# Patient Record
Sex: Female | Born: 1960
Health system: Southern US, Community
[De-identification: ages and names within clinical notes are randomized; demographics above are authoritative.]

## PROBLEM LIST (undated history)

## (undated) DIAGNOSIS — R51 Headache: Secondary | ICD-10-CM

## (undated) DIAGNOSIS — K08109 Complete loss of teeth, unspecified cause, unspecified class: Secondary | ICD-10-CM

## (undated) DIAGNOSIS — IMO0002 Reserved for concepts with insufficient information to code with codable children: Secondary | ICD-10-CM

## (undated) DIAGNOSIS — R42 Dizziness and giddiness: Secondary | ICD-10-CM

## (undated) DIAGNOSIS — H409 Unspecified glaucoma: Secondary | ICD-10-CM

## (undated) DIAGNOSIS — G8929 Other chronic pain: Secondary | ICD-10-CM

## (undated) DIAGNOSIS — K862 Cyst of pancreas: Secondary | ICD-10-CM

## (undated) DIAGNOSIS — J189 Pneumonia, unspecified organism: Secondary | ICD-10-CM

## (undated) DIAGNOSIS — G894 Chronic pain syndrome: Secondary | ICD-10-CM

## (undated) DIAGNOSIS — M1990 Unspecified osteoarthritis, unspecified site: Secondary | ICD-10-CM

## (undated) DIAGNOSIS — K219 Gastro-esophageal reflux disease without esophagitis: Secondary | ICD-10-CM

## (undated) DIAGNOSIS — Z91199 Patient's noncompliance with other medical treatment and regimen due to unspecified reason: Secondary | ICD-10-CM

## (undated) DIAGNOSIS — Z9119 Patient's noncompliance with other medical treatment and regimen: Secondary | ICD-10-CM

## (undated) DIAGNOSIS — M5126 Other intervertebral disc displacement, lumbar region: Secondary | ICD-10-CM

## (undated) DIAGNOSIS — M545 Low back pain, unspecified: Secondary | ICD-10-CM

## (undated) DIAGNOSIS — M5010 Cervical disc disorder with radiculopathy, unspecified cervical region: Secondary | ICD-10-CM

## (undated) DIAGNOSIS — S83209A Unspecified tear of unspecified meniscus, current injury, unspecified knee, initial encounter: Secondary | ICD-10-CM

## (undated) DIAGNOSIS — I10 Essential (primary) hypertension: Secondary | ICD-10-CM

## (undated) DIAGNOSIS — E119 Type 2 diabetes mellitus without complications: Secondary | ICD-10-CM

## (undated) DIAGNOSIS — B029 Zoster without complications: Secondary | ICD-10-CM

## (undated) DIAGNOSIS — F329 Major depressive disorder, single episode, unspecified: Secondary | ICD-10-CM

## (undated) DIAGNOSIS — F32A Depression, unspecified: Secondary | ICD-10-CM

## (undated) DIAGNOSIS — E1142 Type 2 diabetes mellitus with diabetic polyneuropathy: Secondary | ICD-10-CM

## (undated) DIAGNOSIS — R079 Chest pain, unspecified: Secondary | ICD-10-CM

## (undated) HISTORY — DX: Major depressive disorder, single episode, unspecified: F32.9

## (undated) HISTORY — DX: Headache: R51

## (undated) HISTORY — DX: Cyst of pancreas: K86.2

## (undated) HISTORY — DX: Other chronic pain: G89.29

## (undated) HISTORY — DX: Type 2 diabetes mellitus without complications: E11.9

## (undated) HISTORY — DX: Unspecified glaucoma: H40.9

## (undated) HISTORY — DX: Essential (primary) hypertension: I10

## (undated) HISTORY — DX: Depression, unspecified: F32.A

## (undated) HISTORY — DX: Gastro-esophageal reflux disease without esophagitis: K21.9

## (undated) HISTORY — DX: Chest pain, unspecified: R07.9

## (undated) HISTORY — PX: ABDOMINAL HYSTERECTOMY: SHX81

## (undated) HISTORY — DX: Low back pain, unspecified: M54.50

## (undated) HISTORY — DX: Low back pain: M54.5

---

## 1997-10-18 ENCOUNTER — Other Ambulatory Visit: Admission: RE | Admit: 1997-10-18 | Discharge: 1997-10-18 | Payer: Self-pay | Admitting: *Deleted

## 1997-12-04 ENCOUNTER — Emergency Department (HOSPITAL_COMMUNITY): Admission: EM | Admit: 1997-12-04 | Discharge: 1997-12-04 | Payer: Self-pay | Admitting: Emergency Medicine

## 1998-01-10 ENCOUNTER — Emergency Department (HOSPITAL_COMMUNITY): Admission: EM | Admit: 1998-01-10 | Discharge: 1998-01-10 | Payer: Self-pay | Admitting: Emergency Medicine

## 1998-09-02 ENCOUNTER — Emergency Department (HOSPITAL_COMMUNITY): Admission: EM | Admit: 1998-09-02 | Discharge: 1998-09-02 | Payer: Self-pay | Admitting: Emergency Medicine

## 1999-02-03 ENCOUNTER — Other Ambulatory Visit: Admission: RE | Admit: 1999-02-03 | Discharge: 1999-02-03 | Payer: Self-pay | Admitting: *Deleted

## 2000-02-03 ENCOUNTER — Other Ambulatory Visit: Admission: RE | Admit: 2000-02-03 | Discharge: 2000-02-03 | Payer: Self-pay | Admitting: *Deleted

## 2000-08-19 ENCOUNTER — Emergency Department (HOSPITAL_COMMUNITY): Admission: EM | Admit: 2000-08-19 | Discharge: 2000-08-19 | Payer: Self-pay

## 2000-11-30 ENCOUNTER — Encounter: Payer: Self-pay | Admitting: Emergency Medicine

## 2000-11-30 ENCOUNTER — Emergency Department (HOSPITAL_COMMUNITY): Admission: EM | Admit: 2000-11-30 | Discharge: 2000-11-30 | Payer: Self-pay | Admitting: Emergency Medicine

## 2001-09-03 ENCOUNTER — Emergency Department (HOSPITAL_COMMUNITY): Admission: EM | Admit: 2001-09-03 | Discharge: 2001-09-03 | Payer: Self-pay | Admitting: *Deleted

## 2001-11-02 ENCOUNTER — Emergency Department (HOSPITAL_COMMUNITY): Admission: EM | Admit: 2001-11-02 | Discharge: 2001-11-03 | Payer: Self-pay | Admitting: Emergency Medicine

## 2001-11-26 ENCOUNTER — Emergency Department (HOSPITAL_COMMUNITY): Admission: EM | Admit: 2001-11-26 | Discharge: 2001-11-26 | Payer: Self-pay | Admitting: Emergency Medicine

## 2001-11-26 ENCOUNTER — Encounter: Payer: Self-pay | Admitting: Emergency Medicine

## 2002-05-16 ENCOUNTER — Emergency Department (HOSPITAL_COMMUNITY): Admission: EM | Admit: 2002-05-16 | Discharge: 2002-05-16 | Payer: Self-pay | Admitting: Emergency Medicine

## 2002-07-29 ENCOUNTER — Emergency Department (HOSPITAL_COMMUNITY): Admission: EM | Admit: 2002-07-29 | Discharge: 2002-07-30 | Payer: Self-pay

## 2002-08-05 ENCOUNTER — Encounter: Payer: Self-pay | Admitting: Orthopedic Surgery

## 2002-08-05 ENCOUNTER — Ambulatory Visit (HOSPITAL_COMMUNITY): Admission: RE | Admit: 2002-08-05 | Discharge: 2002-08-05 | Payer: Self-pay | Admitting: Orthopedic Surgery

## 2003-04-27 ENCOUNTER — Emergency Department (HOSPITAL_COMMUNITY): Admission: EM | Admit: 2003-04-27 | Discharge: 2003-04-27 | Payer: Self-pay | Admitting: Internal Medicine

## 2003-05-15 ENCOUNTER — Other Ambulatory Visit: Admission: RE | Admit: 2003-05-15 | Discharge: 2003-05-15 | Payer: Self-pay | Admitting: *Deleted

## 2003-11-01 ENCOUNTER — Encounter: Admission: RE | Admit: 2003-11-01 | Discharge: 2003-11-01 | Payer: Self-pay | Admitting: *Deleted

## 2003-11-07 ENCOUNTER — Encounter: Admission: RE | Admit: 2003-11-07 | Discharge: 2003-11-07 | Payer: Self-pay | Admitting: *Deleted

## 2003-12-11 ENCOUNTER — Emergency Department (HOSPITAL_COMMUNITY): Admission: EM | Admit: 2003-12-11 | Discharge: 2003-12-11 | Payer: Self-pay | Admitting: Emergency Medicine

## 2004-12-22 ENCOUNTER — Emergency Department (HOSPITAL_COMMUNITY): Admission: EM | Admit: 2004-12-22 | Discharge: 2004-12-22 | Payer: Self-pay | Admitting: Emergency Medicine

## 2004-12-24 ENCOUNTER — Emergency Department (HOSPITAL_COMMUNITY): Admission: EM | Admit: 2004-12-24 | Discharge: 2004-12-24 | Payer: Self-pay | Admitting: Emergency Medicine

## 2005-02-16 ENCOUNTER — Emergency Department (HOSPITAL_COMMUNITY): Admission: EM | Admit: 2005-02-16 | Discharge: 2005-02-16 | Payer: Self-pay | Admitting: Family Medicine

## 2005-03-30 ENCOUNTER — Emergency Department (HOSPITAL_COMMUNITY): Admission: EM | Admit: 2005-03-30 | Discharge: 2005-03-30 | Payer: Self-pay | Admitting: Family Medicine

## 2005-04-01 ENCOUNTER — Emergency Department (HOSPITAL_COMMUNITY): Admission: EM | Admit: 2005-04-01 | Discharge: 2005-04-01 | Payer: Self-pay | Admitting: Emergency Medicine

## 2005-08-04 ENCOUNTER — Observation Stay (HOSPITAL_COMMUNITY): Admission: RE | Admit: 2005-08-04 | Discharge: 2005-08-05 | Payer: Self-pay | Admitting: *Deleted

## 2005-08-04 ENCOUNTER — Encounter (INDEPENDENT_AMBULATORY_CARE_PROVIDER_SITE_OTHER): Payer: Self-pay | Admitting: Specialist

## 2005-08-24 ENCOUNTER — Emergency Department (HOSPITAL_COMMUNITY): Admission: EM | Admit: 2005-08-24 | Discharge: 2005-08-24 | Payer: Self-pay | Admitting: Emergency Medicine

## 2006-08-30 ENCOUNTER — Emergency Department (HOSPITAL_COMMUNITY): Admission: EM | Admit: 2006-08-30 | Discharge: 2006-08-30 | Payer: Self-pay | Admitting: Family Medicine

## 2006-12-28 ENCOUNTER — Emergency Department (HOSPITAL_COMMUNITY): Admission: EM | Admit: 2006-12-28 | Discharge: 2006-12-28 | Payer: Self-pay | Admitting: Emergency Medicine

## 2007-05-28 ENCOUNTER — Emergency Department (HOSPITAL_COMMUNITY): Admission: EM | Admit: 2007-05-28 | Discharge: 2007-05-28 | Payer: Self-pay | Admitting: Emergency Medicine

## 2007-07-01 ENCOUNTER — Emergency Department (HOSPITAL_COMMUNITY): Admission: EM | Admit: 2007-07-01 | Discharge: 2007-07-01 | Payer: Self-pay | Admitting: Emergency Medicine

## 2007-08-24 ENCOUNTER — Emergency Department (HOSPITAL_COMMUNITY): Admission: EM | Admit: 2007-08-24 | Discharge: 2007-08-24 | Payer: Self-pay | Admitting: Emergency Medicine

## 2007-10-24 ENCOUNTER — Emergency Department (HOSPITAL_COMMUNITY): Admission: EM | Admit: 2007-10-24 | Discharge: 2007-10-24 | Payer: Self-pay | Admitting: Emergency Medicine

## 2008-01-26 ENCOUNTER — Emergency Department (HOSPITAL_COMMUNITY): Admission: EM | Admit: 2008-01-26 | Discharge: 2008-01-26 | Payer: Self-pay | Admitting: Family Medicine

## 2008-03-29 ENCOUNTER — Emergency Department (HOSPITAL_COMMUNITY): Admission: EM | Admit: 2008-03-29 | Discharge: 2008-03-29 | Payer: Self-pay | Admitting: Family Medicine

## 2008-04-26 ENCOUNTER — Emergency Department (HOSPITAL_COMMUNITY): Admission: EM | Admit: 2008-04-26 | Discharge: 2008-04-27 | Payer: Self-pay | Admitting: *Deleted

## 2008-08-03 ENCOUNTER — Emergency Department (HOSPITAL_COMMUNITY): Admission: EM | Admit: 2008-08-03 | Discharge: 2008-08-03 | Payer: Self-pay | Admitting: Family Medicine

## 2008-08-08 ENCOUNTER — Emergency Department (HOSPITAL_COMMUNITY): Admission: EM | Admit: 2008-08-08 | Discharge: 2008-08-08 | Payer: Self-pay | Admitting: Emergency Medicine

## 2009-02-07 ENCOUNTER — Emergency Department (HOSPITAL_COMMUNITY): Admission: EM | Admit: 2009-02-07 | Discharge: 2009-02-07 | Payer: Self-pay | Admitting: Emergency Medicine

## 2009-07-20 DIAGNOSIS — E119 Type 2 diabetes mellitus without complications: Secondary | ICD-10-CM

## 2009-07-20 HISTORY — DX: Type 2 diabetes mellitus without complications: E11.9

## 2009-08-18 ENCOUNTER — Emergency Department (HOSPITAL_COMMUNITY): Admission: EM | Admit: 2009-08-18 | Discharge: 2009-08-19 | Payer: Self-pay | Admitting: Emergency Medicine

## 2009-08-30 ENCOUNTER — Ambulatory Visit (HOSPITAL_COMMUNITY): Admission: RE | Admit: 2009-08-30 | Discharge: 2009-08-30 | Payer: Self-pay | Admitting: Orthopedic Surgery

## 2009-09-03 ENCOUNTER — Ambulatory Visit (HOSPITAL_COMMUNITY): Admission: RE | Admit: 2009-09-03 | Discharge: 2009-09-03 | Payer: Self-pay | Admitting: Orthopedic Surgery

## 2009-10-02 ENCOUNTER — Ambulatory Visit (HOSPITAL_COMMUNITY): Admission: RE | Admit: 2009-10-02 | Discharge: 2009-10-02 | Payer: Self-pay | Admitting: Gastroenterology

## 2010-06-30 ENCOUNTER — Emergency Department (HOSPITAL_COMMUNITY)
Admission: EM | Admit: 2010-06-30 | Discharge: 2010-06-30 | Payer: Self-pay | Source: Home / Self Care | Admitting: Emergency Medicine

## 2010-09-29 LAB — URINALYSIS, ROUTINE W REFLEX MICROSCOPIC
Bilirubin Urine: NEGATIVE
Glucose, UA: >1000 mg/dL — AB
Hgb urine dipstick: NEGATIVE
Ketones, ur: 40 mg/dL — AB
Leukocytes, UA: NEGATIVE
Nitrite: NEGATIVE
Protein, ur: NEGATIVE mg/dL
Specific Gravity, Urine: 1.041 — ABNORMAL HIGH (ref 1.005–1.030)
Urobilinogen, UA: 0.2 mg/dL (ref 0.0–1.0)
pH: 6 (ref 5.0–8.0)

## 2010-09-29 LAB — BASIC METABOLIC PANEL
BUN: 13 mg/dL (ref 6–23)
CO2: 21 mEq/L (ref 19–32)
Calcium: 9.4 mg/dL (ref 8.4–10.5)
Chloride: 101 mEq/L (ref 96–112)
Creatinine, Ser: 0.76 mg/dL (ref 0.4–1.2)
GFR calc Af Amer: >60 mL/min (ref >60)
GFR calc non Af Amer: >60 mL/min (ref >60)
Glucose, Bld: 465 mg/dL — ABNORMAL HIGH (ref 70–99)
Potassium: 4.5 mEq/L (ref 3.5–5.1)
Sodium: 132 mEq/L — ABNORMAL LOW (ref 135–145)

## 2010-09-29 LAB — GLUCOSE, CAPILLARY
Glucose-Capillary: 260 mg/dL — ABNORMAL HIGH (ref 70–99)
Glucose-Capillary: 497 mg/dL — ABNORMAL HIGH (ref 70–99)

## 2010-09-29 LAB — URINE MICROSCOPIC-ADD ON

## 2010-10-13 LAB — PANC CYST FLD ANLYS-PATHFNDR-TG

## 2010-12-05 NOTE — Op Note (Signed)
[name] [name], EBEY NO.:  [id number]   MEDICAL RECORD NO.:  [id number]          PATIENT TYPE:  OBV   LOCATION:  9315                          FACILITY:  [facility]   PHYSICIAN:  Gerri Spore B. Earlene Plater, M.D.  DATE OF BIRTH:  1960-11-01   DATE OF PROCEDURE:  08/04/2005  DATE OF DISCHARGE:                                 OPERATIVE REPORT   PREOPERATIVE DIAGNOSIS:  Heavy menstrual bleeding.   POSTOPERATIVE DIAGNOSIS:  Heavy menstrual bleeding.   OPERATION/PROCEDURE:  Laparoscopically supracervical hysterectomy.   SURGEON:  Chester Holstein. Earlene Plater, M.D.   ASSISTANT:  Lenoard Aden, M.D.   ANESTHESIA:  General.   SPECIMENS:  Uterus.   ESTIMATED BLOOD LOSS:  __________.   COMPLICATIONS:  None.   INDICATIONS:  The patient with a history of heavy and irregular menstrual  periods.  Ultrasound suggestive of a slightly enlarged uterus.  No discrete  fibroids were seen.  Endometrial biopsy was benign and no history of  abnormal Pap smears.  Options of management including things such as Jearld Adjutant  IUD, endometrial ablation or hysterectomy were discussed.  The patient  preferred to move on to definitive, minimally invasive surgical therapy.  She was made aware of the 1% chance of regular menses and 7% occasional  staining and spotting.  Additional risks of surgery discussed including  infection, bleeding, damage to surrounding organs.  The patient also made  aware of the need for continued cervical cytology.   DESCRIPTION OF PROCEDURE:  The patient was taken to the operating room and  general anesthesia obtained.  She was prepped and draped in the standard  fashion and Foley catheter inserted into the bladder.   A 10 mm incision placed in the umbilicus, carried sharply to the fascia.  The fascia was divided sharply and elevated with Kocher clamps.  Posterior  sheath and peritoneum were entered sharply.  Purse-string suture of 0 Vicryl  placed around the fascial defect.  Hasson  cannula inserted and secured.  Pneumoperitoneum obtained with CO2 gas.  Trendelenburg position was  obtained.  An 11 mm Excel trocar placed in the left lower quadrant, 5 mm in  the right.  Each under direct laparoscopic visualization.   Upper abdomen was inspected and it was normal.  The pelvis was inspected and  it showed a upper limits of normal size uterus which was retroverted.  It  had a very boggy appearance consistent with adenomyosis.  Tubes and ovaries  appeared normal.  Minimal scarring from the bladder to the uterus from her  previous two cesarean sections.   The course of each ureter was identified.  The left round ligament was  placed on traction, sealed and divided with the  Harmonic.  The left tube  and left utero-ovarian ligament were similarly sealed and divided with the  Harmonic. The bladder flap was developed with the Harmonic.  The cardinal  ligament was then skeletonized, exposing the uterine artery and vein.  These  were clamped with the Harmonic ACE, sealed and divided.  The entire  procedure was repeated on the right side in the  exact same manner.   The cervix was transected in the reverse cone fashion with the Harmonic.  After amputation, the cervical canal was cauterized 20 seconds with Harmonic  on max setting.   The morcellator device was then inserted and the uterus morcellated in the  standard fashion. All debris was removed.   The pneumoperitoneum was taken down to 5-6 mm, and there was one bleeder in  the right angle of the cervical stump which was made hemostatic with bipolar  cautery.  The pelvis was hemostatic.  Pelvis was irrigated. No other debris  identified and, therefore, the procedure was terminated.   The inferior ports were removed and their sites hemostatic.  As the 11 mm  port was an Excel trocar, fascial closure was deemed not necessary.  The  scope was removed and Hasson cannula removed.  Gas was released. Abdominal  wall elevated with  [employer] retractors and the purse-string suture was  snugged down.  This obliterated the fascial defect and no intra-abdominal  contents herniated through prior to closure.  Skin was closed in the  umbilicus with 4-0 Vicryl.  Skin was closed at the inferior ports with  Dermabond.   The patient tolerated the procedure well.  No complications.  She was taken  to the recovery room awake, alert and in stable condition.  All counts  correct per the operating room staff.      Gerri Spore B. Earlene Plater, M.D.  Electronically Signed     WBD/MEDQ  D:  08/04/2005  T:  08/05/2005  Job:  [id number]

## 2011-04-10 LAB — POCT URINALYSIS DIP (DEVICE)
Bilirubin Urine: NEGATIVE
Glucose, UA: NEGATIVE
Ketones, ur: NEGATIVE
Nitrite: NEGATIVE
Operator id: 239701
Protein, ur: 30 — AB
Specific Gravity, Urine: 1.02
Urobilinogen, UA: 0.2
pH: 6.5

## 2011-04-22 LAB — URINE CULTURE: Colony Count: >=100000

## 2011-04-22 LAB — POCT URINALYSIS DIP (DEVICE)
Bilirubin Urine: NEGATIVE
Glucose, UA: NEGATIVE
Ketones, ur: NEGATIVE
Nitrite: NEGATIVE
Operator id: 239701
Protein, ur: 100 — AB
Specific Gravity, Urine: 1.025
Urobilinogen, UA: 0.2
pH: 5.5

## 2011-12-07 ENCOUNTER — Emergency Department (HOSPITAL_COMMUNITY): Payer: Self-pay

## 2011-12-07 ENCOUNTER — Encounter (HOSPITAL_COMMUNITY): Payer: Self-pay | Admitting: Emergency Medicine

## 2011-12-07 ENCOUNTER — Emergency Department (HOSPITAL_COMMUNITY)
Admission: EM | Admit: 2011-12-07 | Discharge: 2011-12-07 | Disposition: A | Discharge status: Home / Self Care | Payer: Self-pay | Attending: Emergency Medicine | Admitting: Emergency Medicine

## 2011-12-07 DIAGNOSIS — E119 Type 2 diabetes mellitus without complications: Secondary | ICD-10-CM | POA: Insufficient documentation

## 2011-12-07 DIAGNOSIS — Z79899 Other long term (current) drug therapy: Secondary | ICD-10-CM | POA: Insufficient documentation

## 2011-12-07 DIAGNOSIS — R209 Unspecified disturbances of skin sensation: Secondary | ICD-10-CM | POA: Insufficient documentation

## 2011-12-07 DIAGNOSIS — R079 Chest pain, unspecified: Secondary | ICD-10-CM | POA: Insufficient documentation

## 2011-12-07 DIAGNOSIS — R110 Nausea: Secondary | ICD-10-CM | POA: Insufficient documentation

## 2011-12-07 DIAGNOSIS — R0602 Shortness of breath: Secondary | ICD-10-CM | POA: Insufficient documentation

## 2011-12-07 LAB — DIFFERENTIAL
Basophils Absolute: 0 10*3/uL (ref 0.0–0.1)
Basophils Relative: 0 % (ref 0–1)
Eosinophils Absolute: 0.2 10*3/uL (ref 0.0–0.7)
Eosinophils Relative: 3 % (ref 0–5)
Lymphocytes Relative: 37 % (ref 12–46)
Lymphs Abs: 2.1 10*3/uL (ref 0.7–4.0)
Monocytes Absolute: 0.4 10*3/uL (ref 0.1–1.0)
Monocytes Relative: 7 % (ref 3–12)
Neutro Abs: 3 10*3/uL (ref 1.7–7.7)
Neutrophils Relative %: 53 % (ref 43–77)

## 2011-12-07 LAB — CBC
HCT: 40.3 % (ref 36.0–46.0)
Hemoglobin: 13.8 g/dL (ref 12.0–15.0)
MCH: 32.3 pg (ref 26.0–34.0)
MCHC: 34.2 g/dL (ref 30.0–36.0)
MCV: 94.4 fL (ref 78.0–100.0)
Platelets: 234 10*3/uL (ref 150–400)
RBC: 4.27 MIL/uL (ref 3.87–5.11)
RDW: 12 % (ref 11.5–15.5)
WBC: 5.7 10*3/uL (ref 4.0–10.5)

## 2011-12-07 LAB — BASIC METABOLIC PANEL
BUN: 9 mg/dL (ref 6–23)
CO2: 18 mEq/L — ABNORMAL LOW (ref 19–32)
Calcium: 9.4 mg/dL (ref 8.4–10.5)
Chloride: 105 mEq/L (ref 96–112)
Creatinine, Ser: 0.68 mg/dL (ref 0.50–1.10)
GFR calc Af Amer: >90 mL/min (ref >90)
GFR calc non Af Amer: >90 mL/min (ref >90)
Glucose, Bld: 192 mg/dL — ABNORMAL HIGH (ref 70–99)
Potassium: 3.9 mEq/L (ref 3.5–5.1)
Sodium: 136 mEq/L (ref 135–145)

## 2011-12-07 LAB — POCT I-STAT TROPONIN I: Troponin i, poc: 0 ng/mL (ref 0.00–0.08)

## 2011-12-07 LAB — GLUCOSE, CAPILLARY: Glucose-Capillary: 183 mg/dL — ABNORMAL HIGH (ref 70–99)

## 2011-12-07 MED ORDER — ONDANSETRON HCL 4 MG/2ML IJ SOLN
INTRAMUSCULAR | Status: AC
  Administered 2011-12-07: 4 mg
  Filled 2011-12-07: qty 2

## 2011-12-07 MED ORDER — NITROGLYCERIN 0.4 MG SL SUBL
0.4000 mg | SUBLINGUAL_TABLET | Freq: Once | SUBLINGUAL | Status: AC
  Administered 2011-12-07: 0.4 mg via SUBLINGUAL
  Filled 2011-12-07: qty 25

## 2011-12-07 MED ORDER — OXYCODONE-ACETAMINOPHEN 5-325 MG PO TABS: 1.0000 | ORAL_TABLET | Freq: Four times a day (QID) | ORAL | Status: AC | PRN

## 2011-12-07 MED ORDER — MORPHINE SULFATE 4 MG/ML IJ SOLN
4.0000 mg | Freq: Once | INTRAMUSCULAR | Status: AC
  Administered 2011-12-07: 4 mg via INTRAVENOUS
  Filled 2011-12-07: qty 1

## 2011-12-07 MED ORDER — SODIUM CHLORIDE 0.9 % IV BOLUS (SEPSIS)
500.0000 mL | Freq: Once | INTRAVENOUS | Status: AC
  Administered 2011-12-07: 500 mL via INTRAVENOUS

## 2011-12-07 NOTE — ED Provider Notes (Signed)
History   This chart was scribed for Donnetta Hutching, MD by Clarita Crane. The patient was seen in room APA06/APA06. Patient's care was started at 0711.    CSN: [id number]  Arrival date & time 12/07/11  0711   First MD Initiated Contact with Patient 12/07/11 [phone]      Chief Complaint  Patient presents with  . Abdominal Pain    (Consider location/radiation/quality/duration/timing/severity/associated sxs/prior treatment) HPI [name] [name] is a 51 y.o. female who presents to the Emergency Department complaining of intermittent moderate to severe chest pain localized to sternal region and described as tightness onset 2 days ago and gradually worsening since with associated SOB, diffuse numbness and nausea. Patient reports episodes of chest pain last 2 minutes before resolving on own and occur approximately 10 times per hour. Notes chest pain is not aggravated with palpation or exertion. Denies diaphoresis, vomiting, fever, chills, cough. Patient with a h/o type 2 diabetes, abdominal hysterectomy and c-section and is a current smoker. Patient reports having family h/o cardiac problems (brother with MI at age 35).   PCP- Elly Modena Garden  Past Medical History  Diagnosis Date  . Diabetes mellitus     Past Surgical History  Procedure Date  . Abdominal hysterectomy   . Cesarean section     History reviewed. No pertinent family history.  History  Substance Use Topics  . Smoking status: Current Everyday Smoker  . Smokeless tobacco: Not on file  . Alcohol Use: Yes     occasional    OB History    Grav Para Term Preterm Abortions TAB SAB Ect Mult Living                  Review of Systems A complete 10 system review of systems was obtained and all systems are negative except as noted in the HPI and PMH.   Allergies  Tramadol  Home Medications   Current Outpatient Rx  Name Route Sig Dispense Refill  . CITALOPRAM HYDROBROMIDE 10 MG PO TABS Oral Take 10 mg by mouth daily.      . IBUPROFEN 200 MG PO TABS Oral Take 800 mg by mouth every 6 (six) hours as needed. For pain    . LISINOPRIL 10 MG PO TABS Oral Take 10 mg by mouth every evening.     Marland Kitchen METFORMIN HCL 1000 MG PO TABS Oral Take 1,000 mg by mouth 2 (two) times daily with a meal.      BP 137/64  Pulse 78  Temp 97.8 F (36.6 C)  Resp 25  SpO2 100%  Physical Exam  Nursing note and vitals reviewed. Constitutional: She is oriented to person, place, and time. She appears well-developed and well-nourished.       Tearful  HENT:  Head: Normocephalic and atraumatic.  Eyes: EOM are normal. Pupils are equal, round, and reactive to light.  Neck: Neck supple. No tracheal deviation present.  Cardiovascular: Normal rate and regular rhythm.  Exam reveals no gallop and no friction rub.   No murmur heard. Pulmonary/Chest: Effort normal. No respiratory distress. She has no wheezes. She has no rales. She exhibits no tenderness.  Abdominal: Soft. She exhibits no distension.  Musculoskeletal: Normal range of motion. She exhibits no edema.  Neurological: She is alert and oriented to person, place, and time. No sensory deficit.  Skin: Skin is warm and dry.  Psychiatric: Her behavior is normal.       Tearful     ED Course  Procedures (including critical care  time)  DIAGNOSTIC STUDIES: Oxygen Saturation is 100% on room air, normal by my interpretation.    COORDINATION OF CARE: 7:53AM-Patient informed of current clinical impression, plan for treatment and evaluation and agrees with plan at this time. Will administer pain medication NTG, morphine and obtain labs.     Labs Reviewed  GLUCOSE, CAPILLARY - Abnormal; Notable for the following:    Glucose-Capillary 183 (*)    All other components within normal limits  BASIC METABOLIC PANEL - Abnormal; Notable for the following:    CO2 18 (*)    Glucose, Bld 192 (*)    All other components within normal limits  CBC  DIFFERENTIAL   Dg Chest 2 View  12/07/2011   *RADIOLOGY REPORT*  Clinical Data: Chest pain  CHEST - 2 VIEW  Comparison: 08/08/2008  Findings: Normal heart size.  Clear lungs.  No pneumothorax or pleural effusion.  IMPRESSION: No active cardiopulmonary disease.  Original Report Authenticated By: Donavan Burnet, M.D.   Date: 12/07/2011  Rate: 79  Rhythm: normal sinus rhythm  QRS Axis: normal  Intervals: normal  ST/T Wave abnormalities: normal  Conduction Disutrbances: none  Narrative Interpretation: unremarkable  Results for orders placed during the hospital encounter of 12/07/11  GLUCOSE, CAPILLARY      Component Value Range   Glucose-Capillary 183 (*) 70 - 99 (mg/dL)  CBC      Component Value Range   WBC 5.7  4.0 - 10.5 (K/uL)   RBC 4.27  3.87 - 5.11 (MIL/uL)   Hemoglobin 13.8  12.0 - 15.0 (g/dL)   HCT 16.1  09.6 - 04.5 (%)   MCV 94.4  78.0 - 100.0 (fL)   MCH 32.3  26.0 - 34.0 (pg)   MCHC 34.2  30.0 - 36.0 (g/dL)   RDW 40.9  81.1 - 91.4 (%)   Platelets 234  150 - 400 (K/uL)  DIFFERENTIAL      Component Value Range   Neutrophils Relative 53  43 - 77 (%)   Neutro Abs 3.0  1.7 - 7.7 (K/uL)   Lymphocytes Relative 37  12 - 46 (%)   Lymphs Abs 2.1  0.7 - 4.0 (K/uL)   Monocytes Relative 7  3 - 12 (%)   Monocytes Absolute 0.4  0.1 - 1.0 (K/uL)   Eosinophils Relative 3  0 - 5 (%)   Eosinophils Absolute 0.2  0.0 - 0.7 (K/uL)   Basophils Relative 0  0 - 1 (%)   Basophils Absolute 0.0  0.0 - 0.1 (K/uL)  BASIC METABOLIC PANEL      Component Value Range   Sodium 136  135 - 145 (mEq/L)   Potassium 3.9  3.5 - 5.1 (mEq/L)   Chloride 105  96 - 112 (mEq/L)   CO2 18 (*) 19 - 32 (mEq/L)   Glucose, Bld 192 (*) 70 - 99 (mg/dL)   BUN 9  6 - 23 (mg/dL)   Creatinine, Ser 7.82  0.50 - 1.10 (mg/dL)   Calcium 9.4  8.4 - 95.6 (mg/dL)   GFR calc non Af Amer >90  >90 (mL/min)   GFR calc Af Amer >90  >90 (mL/min)  POCT I-STAT TROPONIN I      Component Value Range   Troponin i, poc 0.00  0.00 - 0.08 (ng/mL)   Comment 3               No  diagnosis found.    MDM  Patient feeling much better.  Normal vital signs. His troponin  negative. EKG negative. Followup with cardiologist. Appointment made for patient. We'll return if worse     I personally performed the services described in this documentation, which was scribed in my presence. The recorded information has been reviewed and considered.    Donnetta Hutching, MD 12/07/11 1155

## 2011-12-07 NOTE — ED Notes (Signed)
Pt c/o intermittant pain to epigastric area and under L breast. Pain woke her up this am with sharp pains and sob. Pt anxious and hyperventilating. States pain worse with breathing. Dizzy at present-demonstrated normal breathing. Denies pain worse with eating or moving. Denies n/v/d/cough.

## 2011-12-07 NOTE — ED Notes (Signed)
ntg did not help. Pain is coming intermittant and last a few seconds. Pt then starts to hyperventilate. Advised of correct breathing and comfort measures given. Pt states woke up with 4 spots on body x 3 days ago. All look different and thinks something may have bitten her. 2 on head. One on r lower leg and one to r shoulder area. nad

## 2011-12-07 NOTE — Discharge Instructions (Signed)
Chest Pain, Nonspecific It is often hard to give a specific diagnosis for the cause of chest pain. There is always a chance that your pain could be related to something serious, like a heart attack or a blood clot in the lungs. You need to follow up with your caregiver for further evaluation. More lab tests or other studies such as X-rays, electrocardiography, stress testing, or cardiac imaging may be needed to find the cause of your pain. Most of the time, nonspecific chest pain improves within 2 to 3 days with rest and mild pain medicine. For the next few days, avoid physical exertion or activities that bring on pain. Do not smoke. Avoid drinking alcohol. Call your caregiver for routine follow-up as advised.  SEEK IMMEDIATE MEDICAL CARE IF:  You develop increased chest pain or pain that radiates to the arm, neck, jaw, back, or abdomen.   You develop shortness of breath, increased coughing, or you start coughing up blood.   You have severe back or abdominal pain, nausea, or vomiting.   You develop severe weakness, fainting, fever, or chills.  Document Released: 07/06/2005 Document Revised: 06/25/2011 Document Reviewed: 12/24/2006 [facility] Patient Information 2012 [place], [place].  Tests were normal. Prescription for pain medication. We have made an appointment with the cardiologist for June 7. Please call office to confirm appointment. Number given

## 2011-12-07 NOTE — ED Notes (Signed)
Appointment with Dr. Dietrich Pates on June 7 at 11:15

## 2011-12-25 ENCOUNTER — Encounter: Payer: Self-pay | Admitting: Cardiology

## 2011-12-25 DIAGNOSIS — E1165 Type 2 diabetes mellitus with hyperglycemia: Secondary | ICD-10-CM | POA: Insufficient documentation

## 2011-12-25 DIAGNOSIS — R079 Chest pain, unspecified: Secondary | ICD-10-CM | POA: Insufficient documentation

## 2011-12-25 DIAGNOSIS — I10 Essential (primary) hypertension: Secondary | ICD-10-CM | POA: Insufficient documentation

## 2012-01-03 NOTE — Progress Notes (Signed)
This encounter was created in error - please disregard.

## 2012-03-07 ENCOUNTER — Emergency Department (HOSPITAL_COMMUNITY)
Admission: EM | Admit: 2012-03-07 | Discharge: 2012-03-08 | Disposition: A | Discharge status: Home / Self Care | Payer: Self-pay | Attending: Emergency Medicine | Admitting: Emergency Medicine

## 2012-03-07 ENCOUNTER — Encounter (HOSPITAL_COMMUNITY): Payer: Self-pay | Admitting: *Deleted

## 2012-03-07 DIAGNOSIS — E119 Type 2 diabetes mellitus without complications: Secondary | ICD-10-CM | POA: Insufficient documentation

## 2012-03-07 DIAGNOSIS — G8929 Other chronic pain: Secondary | ICD-10-CM | POA: Insufficient documentation

## 2012-03-07 DIAGNOSIS — I10 Essential (primary) hypertension: Secondary | ICD-10-CM | POA: Insufficient documentation

## 2012-03-07 DIAGNOSIS — Z87891 Personal history of nicotine dependence: Secondary | ICD-10-CM | POA: Insufficient documentation

## 2012-03-07 LAB — CBC WITH DIFFERENTIAL/PLATELET
Basophils Absolute: 0 10*3/uL (ref 0.0–0.1)
Basophils Relative: 0 % (ref 0–1)
Eosinophils Absolute: 0.3 10*3/uL (ref 0.0–0.7)
Eosinophils Relative: 4 % (ref 0–5)
HCT: 41.6 % (ref 36.0–46.0)
Hemoglobin: 14.5 g/dL (ref 12.0–15.0)
Lymphocytes Relative: 41 % (ref 12–46)
Lymphs Abs: 3.1 10*3/uL (ref 0.7–4.0)
MCH: 31.9 pg (ref 26.0–34.0)
MCHC: 34.9 g/dL (ref 30.0–36.0)
MCV: 91.4 fL (ref 78.0–100.0)
Monocytes Absolute: 0.5 10*3/uL (ref 0.1–1.0)
Monocytes Relative: 6 % (ref 3–12)
Neutro Abs: 3.8 10*3/uL (ref 1.7–7.7)
Neutrophils Relative %: 49 % (ref 43–77)
Platelets: 223 10*3/uL (ref 150–400)
RBC: 4.55 MIL/uL (ref 3.87–5.11)
RDW: 12.3 % (ref 11.5–15.5)
WBC: 7.7 10*3/uL (ref 4.0–10.5)

## 2012-03-07 LAB — URINALYSIS, ROUTINE W REFLEX MICROSCOPIC
Bilirubin Urine: NEGATIVE
Glucose, UA: >1000 mg/dL — AB
Hgb urine dipstick: NEGATIVE
Ketones, ur: NEGATIVE mg/dL
Leukocytes, UA: NEGATIVE
Nitrite: NEGATIVE
Protein, ur: NEGATIVE mg/dL
Specific Gravity, Urine: 1.015 (ref 1.005–1.030)
Urobilinogen, UA: 0.2 mg/dL (ref 0.0–1.0)
pH: 5.5 (ref 5.0–8.0)

## 2012-03-07 LAB — URINE MICROSCOPIC-ADD ON

## 2012-03-07 LAB — BASIC METABOLIC PANEL
BUN: 9 mg/dL (ref 6–23)
CO2: 24 mEq/L (ref 19–32)
Calcium: 9.6 mg/dL (ref 8.4–10.5)
Chloride: 98 mEq/L (ref 96–112)
Creatinine, Ser: 0.64 mg/dL (ref 0.50–1.10)
GFR calc Af Amer: >90 mL/min (ref >90)
GFR calc non Af Amer: >90 mL/min (ref >90)
Glucose, Bld: 294 mg/dL — ABNORMAL HIGH (ref 70–99)
Potassium: 4.2 mEq/L (ref 3.5–5.1)
Sodium: 131 mEq/L — ABNORMAL LOW (ref 135–145)

## 2012-03-07 LAB — GLUCOSE, CAPILLARY: Glucose-Capillary: 345 mg/dL — ABNORMAL HIGH (ref 70–99)

## 2012-03-07 MED ORDER — INSULIN ASPART 100 UNIT/ML IV SOLN
5.0000 [IU] | Freq: Once | INTRAVENOUS | Status: AC
  Administered 2012-03-07: 5 [IU] via INTRAVENOUS

## 2012-03-07 MED ORDER — SODIUM CHLORIDE 0.9 % IV BOLUS (SEPSIS)
1000.0000 mL | Freq: Once | INTRAVENOUS | Status: AC
Start: 2012-03-07 — End: 2012-03-08
  Administered 2012-03-07: 1000 mL via INTRAVENOUS

## 2012-03-07 MED ORDER — SODIUM CHLORIDE 0.9 % IV BOLUS (SEPSIS)
1000.0000 mL | Freq: Once | INTRAVENOUS | Status: AC
  Administered 2012-03-07: 1000 mL via INTRAVENOUS

## 2012-03-07 MED ORDER — INSULIN REGULAR HUMAN 100 UNIT/ML IJ SOLN
5.0000 [IU] | Freq: Once | INTRAMUSCULAR | Status: DC
Start: 2012-03-07 — End: 2012-03-07

## 2012-03-07 NOTE — ED Provider Notes (Signed)
History  This chart was scribed for Donnetta Hutching, MD by Erskine Emery. This patient was seen in room APA08/APA08 and the patient's care was started at 20:55.   CSN: [id number]  Arrival date & time 03/07/12  2038   First MD Initiated Contact with Patient 03/07/12 2055      Chief Complaint  Patient presents with  . Hyperglycemia    (Consider location/radiation/quality/duration/timing/severity/associated sxs/prior treatment) The history is provided by the patient. No language interpreter was used.  [name] [name] is a 51 y.o. female who presents to the Emergency Department complaining of fatigue, visual disturbance, tingling sensation in the fingers, polyuria (she goes more than once/hour), polydipsia, and diarrhea as complications of hyperglycemia. Pt denies any appetite changes but reports she didn't eat much today. Pt reports she first began feeling ill yesterday and noticed her blood glucose was 200. Today her symptoms worsened and her blood glucose has stayed around 400-500. 1-2 years ago the pt was diagnosed with DM, for which she takes metformin (1,000mg  bid) but no insulin injections. Pt reports she has been taking her medication as prescribed except today she took 3 instead of 2 pills because her blood glucose was so elevated (normally it hovers around 110-120).  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   Past Medical History  Diagnosis Date  . Diabetes mellitus, type 2     No insulin  . Chest pain   . Pancreatic cyst     Endoscopic aspiration in 09/2009  . Chronic low back pain     HNP  . Headache   . Depression   . Hypertension     Past Surgical History  Procedure Date  . Abdominal hysterectomy   . Cesarean section     History reviewed. No pertinent family history.  History  Substance Use Topics  . Smoking status: Former Games developer  . Smokeless tobacco: Not on file  . Alcohol Use: Yes     occasional    OB History    Grav Para Term Preterm  Abortions TAB SAB Ect Mult Living                  Review of Systems  Constitutional: Negative for fever, chills and appetite change.  Eyes: Positive for visual disturbance.  Respiratory: Negative for shortness of breath.   Gastrointestinal: Positive for nausea and diarrhea. Negative for vomiting.  Genitourinary: Positive for frequency.  Neurological: Positive for weakness.  All other systems reviewed and are negative.    Allergies  Tramadol  Home Medications   Current Outpatient Rx  Name Route Sig Dispense Refill  . CITALOPRAM HYDROBROMIDE 10 MG PO TABS Oral Take 10 mg by mouth daily.    . IBUPROFEN 200 MG PO TABS Oral Take 800 mg by mouth every 6 (six) hours as needed. For pain    . LISINOPRIL 10 MG PO TABS Oral Take 10 mg by mouth every evening.     Marland Kitchen METFORMIN HCL 1000 MG PO TABS Oral Take 1,000 mg by mouth 2 (two) times daily with a meal.    . POTASSIUM CHLORIDE 20 MEQ PO PACK Oral Take 20 mEq by mouth 2 (two) times daily.      Triage Vitals: BP 118/55  Pulse 74  Temp 98 F (36.7 C) (Oral)  Resp 18  Ht 5\' 6"  (1.676 m)  Wt 180 lb (81.647 kg)  BMI 29.05 kg/m2  SpO2 97%  Physical Exam  Nursing note and vitals reviewed.  Constitutional: She is oriented to person, place, and time. She appears well-developed and well-nourished.  HENT:  Head: Normocephalic and atraumatic.  Eyes: Conjunctivae and EOM are normal.  Neck: Normal range of motion. Neck supple.  Cardiovascular: Normal rate and regular rhythm.   Pulmonary/Chest: Effort normal.  Abdominal: Soft.  Musculoskeletal: Normal range of motion.  Neurological: She is alert and oriented to person, place, and time.  Skin: Skin is warm and dry.  Psychiatric: She has a normal mood and affect.    ED Course  Procedures (including critical care time) DIAGNOSTIC STUDIES: Oxygen Saturation is 97% on room air, normal by my interpretation.    COORDINATION OF CARE: 22:03--I evaluated the patient and we discussed a  treatment plan including insulin, IV fluids, blood work, and urinalysis to which the pt agreed. I informed the pt that she is a type II diabetic, which means she doesn't need to be on insulin normally.   Results for orders placed during the hospital encounter of 03/07/12  URINALYSIS, ROUTINE W REFLEX MICROSCOPIC      Component Value Range   Color, Urine YELLOW  YELLOW   APPearance CLEAR  CLEAR   Specific Gravity, Urine 1.015  1.005 - 1.030   pH 5.5  5.0 - 8.0   Glucose, UA >1000 (*) NEGATIVE mg/dL   Hgb urine dipstick NEGATIVE  NEGATIVE   Bilirubin Urine NEGATIVE  NEGATIVE   Ketones, ur NEGATIVE  NEGATIVE mg/dL   Protein, ur NEGATIVE  NEGATIVE mg/dL   Urobilinogen, UA 0.2  0.0 - 1.0 mg/dL   Nitrite NEGATIVE  NEGATIVE   Leukocytes, UA NEGATIVE  NEGATIVE  URINE MICROSCOPIC-ADD ON      Component Value Range   Squamous Epithelial / LPF FEW (*) RARE   WBC, UA 0-2  <3 WBC/hpf   RBC / HPF 0-2  <3 RBC/hpf   Bacteria, UA RARE  RARE  GLUCOSE, CAPILLARY      Component Value Range   Glucose-Capillary 345 (*) 70 - 99 mg/dL    Results for orders placed during the hospital encounter of 03/07/12  URINALYSIS, ROUTINE W REFLEX MICROSCOPIC      Component Value Range   Color, Urine YELLOW  YELLOW   APPearance CLEAR  CLEAR   Specific Gravity, Urine 1.015  1.005 - 1.030   pH 5.5  5.0 - 8.0   Glucose, UA >1000 (*) NEGATIVE mg/dL   Hgb urine dipstick NEGATIVE  NEGATIVE   Bilirubin Urine NEGATIVE  NEGATIVE   Ketones, ur NEGATIVE  NEGATIVE mg/dL   Protein, ur NEGATIVE  NEGATIVE mg/dL   Urobilinogen, UA 0.2  0.0 - 1.0 mg/dL   Nitrite NEGATIVE  NEGATIVE   Leukocytes, UA NEGATIVE  NEGATIVE  URINE MICROSCOPIC-ADD ON      Component Value Range   Squamous Epithelial / LPF FEW (*) RARE   WBC, UA 0-2  <3 WBC/hpf   RBC / HPF 0-2  <3 RBC/hpf   Bacteria, UA RARE  RARE  GLUCOSE, CAPILLARY      Component Value Range   Glucose-Capillary 345 (*) 70 - 99 mg/dL  CBC WITH DIFFERENTIAL      Component Value  Range   WBC 7.7  4.0 - 10.5 K/uL   RBC 4.55  3.87 - 5.11 MIL/uL   Hemoglobin 14.5  12.0 - 15.0 g/dL   HCT 16.1  09.6 - 04.5 %   MCV 91.4  78.0 - 100.0 fL   MCH 31.9  26.0 - 34.0 pg   MCHC 34.9  30.0 - 36.0 g/dL   RDW 16.1  09.6 - 04.5 %   Platelets 223  150 - 400 K/uL   Neutrophils Relative 49  43 - 77 %   Neutro Abs 3.8  1.7 - 7.7 K/uL   Lymphocytes Relative 41  12 - 46 %   Lymphs Abs 3.1  0.7 - 4.0 K/uL   Monocytes Relative 6  3 - 12 %   Monocytes Absolute 0.5  0.1 - 1.0 K/uL   Eosinophils Relative 4  0 - 5 %   Eosinophils Absolute 0.3  0.0 - 0.7 K/uL   Basophils Relative 0  0 - 1 %   Basophils Absolute 0.0  0.0 - 0.1 K/uL  BASIC METABOLIC PANEL      Component Value Range   Sodium 131 (*) 135 - 145 mEq/L   Potassium 4.2  3.5 - 5.1 mEq/L   Chloride 98  96 - 112 mEq/L   CO2 24  19 - 32 mEq/L   Glucose, Bld 294 (*) 70 - 99 mg/dL   BUN 9  6 - 23 mg/dL   Creatinine, Ser 4.09  0.50 - 1.10 mg/dL   Calcium 9.6  8.4 - 81.1 mg/dL   GFR calc non Af Amer >90  >90 mL/min   GFR calc Af Amer >90  >90 mL/min  GLUCOSE, CAPILLARY      Component Value Range   Glucose-Capillary 170 (*) 70 - 99 mg/dL    No diagnosis found.    MDM  Glucose slightly elevated. Not in DKA.  Glucose has stabilized with IV fluids and small amount of IV insulin.      I personally performed the services described in this documentation, which was scribed in my presence. The recorded information has been reviewed and considered.    Donnetta Hutching, MD 03/08/12 [phone]

## 2012-03-07 NOTE — ED Notes (Signed)
States her blood sugar has been up all day.  C/o generalized weakness today

## 2012-03-07 NOTE — ED Notes (Signed)
Elevated BS all day, Has been over 400.

## 2012-03-08 LAB — GLUCOSE, CAPILLARY: Glucose-Capillary: 170 mg/dL — ABNORMAL HIGH (ref 70–99)

## 2012-03-08 NOTE — ED Notes (Signed)
Completing the IV fluid as ordered

## 2012-08-02 ENCOUNTER — Inpatient Hospital Stay (HOSPITAL_COMMUNITY)
Admission: EM | Admit: 2012-08-02 | Discharge: 2012-08-05 | DRG: 195 | Disposition: A | Discharge status: Home / Self Care | Payer: Self-pay | Attending: Internal Medicine | Admitting: Internal Medicine

## 2012-08-02 ENCOUNTER — Encounter (HOSPITAL_COMMUNITY): Payer: Self-pay | Admitting: *Deleted

## 2012-08-02 ENCOUNTER — Emergency Department (HOSPITAL_COMMUNITY): Payer: Self-pay

## 2012-08-02 DIAGNOSIS — IMO0001 Reserved for inherently not codable concepts without codable children: Secondary | ICD-10-CM | POA: Diagnosis present

## 2012-08-02 DIAGNOSIS — E119 Type 2 diabetes mellitus without complications: Secondary | ICD-10-CM

## 2012-08-02 DIAGNOSIS — E1165 Type 2 diabetes mellitus with hyperglycemia: Secondary | ICD-10-CM | POA: Diagnosis present

## 2012-08-02 DIAGNOSIS — I498 Other specified cardiac arrhythmias: Secondary | ICD-10-CM | POA: Diagnosis present

## 2012-08-02 DIAGNOSIS — Z79899 Other long term (current) drug therapy: Secondary | ICD-10-CM

## 2012-08-02 DIAGNOSIS — J189 Pneumonia, unspecified organism: Principal | ICD-10-CM | POA: Diagnosis present

## 2012-08-02 DIAGNOSIS — I10 Essential (primary) hypertension: Secondary | ICD-10-CM | POA: Diagnosis present

## 2012-08-02 HISTORY — DX: Pneumonia, unspecified organism: J18.9

## 2012-08-02 LAB — URINALYSIS, ROUTINE W REFLEX MICROSCOPIC
Bilirubin Urine: NEGATIVE
Glucose, UA: >1000 mg/dL — AB
Hgb urine dipstick: NEGATIVE
Ketones, ur: 15 mg/dL — AB
Leukocytes, UA: NEGATIVE
Nitrite: NEGATIVE
Protein, ur: NEGATIVE mg/dL
Specific Gravity, Urine: 1.041 — ABNORMAL HIGH (ref 1.005–1.030)
Urobilinogen, UA: 0.2 mg/dL (ref 0.0–1.0)
pH: 6 (ref 5.0–8.0)

## 2012-08-02 LAB — BASIC METABOLIC PANEL
BUN: 9 mg/dL (ref 6–23)
CO2: 21 mEq/L (ref 19–32)
Calcium: 8.9 mg/dL (ref 8.4–10.5)
Chloride: 103 mEq/L (ref 96–112)
Creatinine, Ser: 0.6 mg/dL (ref 0.50–1.10)
GFR calc Af Amer: >90 mL/min (ref >90)
GFR calc non Af Amer: >90 mL/min (ref >90)
Glucose, Bld: 319 mg/dL — ABNORMAL HIGH (ref 70–99)
Potassium: 3.9 mEq/L (ref 3.5–5.1)
Sodium: 136 mEq/L (ref 135–145)

## 2012-08-02 LAB — CBC WITH DIFFERENTIAL/PLATELET
Basophils Absolute: 0 10*3/uL (ref 0.0–0.1)
Basophils Relative: 0 % (ref 0–1)
Eosinophils Absolute: 0.1 10*3/uL (ref 0.0–0.7)
Eosinophils Relative: 1 % (ref 0–5)
HCT: 38.9 % (ref 36.0–46.0)
Hemoglobin: 13.3 g/dL (ref 12.0–15.0)
Lymphocytes Relative: 15 % (ref 12–46)
Lymphs Abs: 1.6 10*3/uL (ref 0.7–4.0)
MCH: 30.7 pg (ref 26.0–34.0)
MCHC: 34.2 g/dL (ref 30.0–36.0)
MCV: 89.8 fL (ref 78.0–100.0)
Monocytes Absolute: 0.7 10*3/uL (ref 0.1–1.0)
Monocytes Relative: 6 % (ref 3–12)
Neutro Abs: 8.2 10*3/uL — ABNORMAL HIGH (ref 1.7–7.7)
Neutrophils Relative %: 78 % — ABNORMAL HIGH (ref 43–77)
Platelets: 201 10*3/uL (ref 150–400)
RBC: 4.33 MIL/uL (ref 3.87–5.11)
RDW: 12.7 % (ref 11.5–15.5)
WBC: 10.5 10*3/uL (ref 4.0–10.5)

## 2012-08-02 LAB — STREP PNEUMONIAE URINARY ANTIGEN: Strep Pneumo Urinary Antigen: NEGATIVE

## 2012-08-02 LAB — GLUCOSE, CAPILLARY
Glucose-Capillary: 250 mg/dL — ABNORMAL HIGH (ref 70–99)
Glucose-Capillary: 179 mg/dL — ABNORMAL HIGH (ref 70–99)

## 2012-08-02 LAB — URINE MICROSCOPIC-ADD ON

## 2012-08-02 LAB — HEMOGLOBIN A1C
Hgb A1c MFr Bld: 10 % — ABNORMAL HIGH (ref <5.7)
Mean Plasma Glucose: 240 mg/dL — ABNORMAL HIGH (ref <117)

## 2012-08-02 MED ORDER — AZITHROMYCIN 250 MG PO TABS
500.0000 mg | ORAL_TABLET | Freq: Once | ORAL | Status: AC
  Administered 2012-08-02: 500 mg via ORAL
  Filled 2012-08-02: qty 2

## 2012-08-02 MED ORDER — ALBUTEROL SULFATE (5 MG/ML) 0.5% IN NEBU
2.5000 mg | INHALATION_SOLUTION | Freq: Four times a day (QID) | RESPIRATORY_TRACT | Status: DC
  Administered 2012-08-02 – 2012-08-03 (×4): 2.5 mg via RESPIRATORY_TRACT
  Filled 2012-08-02 (×4): qty 0.5

## 2012-08-02 MED ORDER — SODIUM CHLORIDE 0.9 % IV BOLUS (SEPSIS)
1000.0000 mL | Freq: Once | INTRAVENOUS | Status: AC
  Administered 2012-08-02: 1000 mL via INTRAVENOUS

## 2012-08-02 MED ORDER — SODIUM CHLORIDE 0.9 % IJ SOLN
3.0000 mL | Freq: Two times a day (BID) | INTRAMUSCULAR | Status: DC
  Administered 2012-08-02 – 2012-08-05 (×4): 3 mL via INTRAVENOUS

## 2012-08-02 MED ORDER — IBUPROFEN 400 MG PO TABS: 400.0000 mg | ORAL_TABLET | Freq: Once | ORAL | Status: DC

## 2012-08-02 MED ORDER — IPRATROPIUM BROMIDE 0.02 % IN SOLN
0.5000 mg | Freq: Once | RESPIRATORY_TRACT | Status: AC
  Administered 2012-08-02: 0.5 mg via RESPIRATORY_TRACT
  Filled 2012-08-02: qty 2.5

## 2012-08-02 MED ORDER — ACETAMINOPHEN 650 MG RE SUPP
650.0000 mg | Freq: Four times a day (QID) | RECTAL | Status: DC | PRN
  Filled 2012-08-02: qty 2

## 2012-08-02 MED ORDER — IBUPROFEN 400 MG PO TABS
400.0000 mg | ORAL_TABLET | Freq: Once | ORAL | Status: AC
  Administered 2012-08-02: 400 mg via ORAL
  Filled 2012-08-02: qty 1

## 2012-08-02 MED ORDER — AZITHROMYCIN 500 MG PO TABS
500.0000 mg | ORAL_TABLET | ORAL | Status: DC
  Administered 2012-08-03 – 2012-08-05 (×3): 500 mg via ORAL
  Filled 2012-08-02 (×3): qty 1

## 2012-08-02 MED ORDER — INSULIN ASPART 100 UNIT/ML ~~LOC~~ SOLN
0.0000 [IU] | Freq: Three times a day (TID) | SUBCUTANEOUS | Status: DC
  Administered 2012-08-02 – 2012-08-03 (×3): 3 [IU] via SUBCUTANEOUS

## 2012-08-02 MED ORDER — IBUPROFEN 800 MG PO TABS
800.0000 mg | ORAL_TABLET | Freq: Once | ORAL | Status: AC
  Administered 2012-08-02: 800 mg via ORAL
  Filled 2012-08-02: qty 1

## 2012-08-02 MED ORDER — ENOXAPARIN SODIUM 40 MG/0.4ML ~~LOC~~ SOLN
40.0000 mg | SUBCUTANEOUS | Status: DC
  Administered 2012-08-02 – 2012-08-04 (×2): 40 mg via SUBCUTANEOUS
  Filled 2012-08-02 (×4): qty 0.4

## 2012-08-02 MED ORDER — DEXTROSE 5 % IV SOLN
1.0000 g | Freq: Once | INTRAVENOUS | Status: AC
  Administered 2012-08-02: 1 g via INTRAVENOUS
  Filled 2012-08-02: qty 10

## 2012-08-02 MED ORDER — ALBUTEROL SULFATE (5 MG/ML) 0.5% IN NEBU
5.0000 mg | INHALATION_SOLUTION | Freq: Once | RESPIRATORY_TRACT | Status: AC
  Administered 2012-08-02: 5 mg via RESPIRATORY_TRACT
  Filled 2012-08-02: qty 1

## 2012-08-02 MED ORDER — HYDROCOD POLST-CHLORPHEN POLST 10-8 MG/5ML PO LQCR
5.0000 mL | Freq: Once | ORAL | Status: AC
  Administered 2012-08-02: 5 mL via ORAL
  Filled 2012-08-02: qty 5

## 2012-08-02 MED ORDER — ONDANSETRON HCL 4 MG/2ML IJ SOLN: 4.0000 mg | Freq: Three times a day (TID) | INTRAMUSCULAR | Status: AC | PRN

## 2012-08-02 MED ORDER — ACETAMINOPHEN 325 MG PO TABS
650.0000 mg | ORAL_TABLET | Freq: Four times a day (QID) | ORAL | Status: DC | PRN
  Administered 2012-08-02 – 2012-08-05 (×8): 650 mg via ORAL
  Filled 2012-08-02 (×8): qty 2

## 2012-08-02 MED ORDER — DEXTROSE 5 % IV SOLN
1.0000 g | INTRAVENOUS | Status: DC
  Administered 2012-08-03 – 2012-08-05 (×3): 1 g via INTRAVENOUS
  Filled 2012-08-02 (×3): qty 10

## 2012-08-02 MED ORDER — SODIUM CHLORIDE 0.9 % IV SOLN: INTRAVENOUS | Status: DC

## 2012-08-02 MED ORDER — IPRATROPIUM BROMIDE 0.02 % IN SOLN
0.5000 mg | Freq: Four times a day (QID) | RESPIRATORY_TRACT | Status: DC
  Administered 2012-08-02 – 2012-08-03 (×4): 0.5 mg via RESPIRATORY_TRACT
  Filled 2012-08-02 (×4): qty 2.5

## 2012-08-02 NOTE — ED Notes (Signed)
Pt reports last week had fevers/chills/body aches. States yesterday began to have shortness of breath. Reports productive cough. States shortness of breath worse with exertion and worse today.

## 2012-08-02 NOTE — ED Notes (Signed)
Report given to Ceasar, Charity fundraiser. Will wait for admission orders then transport to floor.

## 2012-08-02 NOTE — H&P (Signed)
Hospital Admission Note Date: 08/02/2012  Patient name: [name] [name] Medical record number: [id number] Date of birth: 13-Sep-1960 Age: 52 y.o. Gender: female PCP: Provider Not In System  Medical Service: Internal Medicine Teaching Service  Attending physician:  Dr. Kem Kays    1st Contact:  Dr. Garald Braver  Pager:[phone] 2nd Contact:  Dr. Dorise Hiss  Pager:[phone] After 5 pm or weekends: 1st Contact:      Pager: [phone] 2nd Contact:      Pager: [phone]  Chief Complaint: persistent cough and malaise  History of Present Illness: [name] [name] is a 52 year old woman with PMH of DM2, HTN, who presents to the [facility] ED with 11 days of nonproductive cough. In addition, she has had malaise, occasional shortness of breath while coughing, decreased appetite, and subjective fever that subsided 3 days prior to her presentation. She states that most of her family members have been sick with the flu. She had not sought medical care until now and denies taking Tamiflu. She took Mucinex for her cough but she was still unable to cough anything up.   She denies chest pain, abdominal pain, diarrhea, nausea, vomiting, or dysuria.   Meds: Medications Prior to Admission  Medication Sig Dispense Refill  . GuaiFENesin (MUCINEX PO) Take 2 tablets by mouth 4 (four) times daily as needed. For congestion      . ibuprofen (ADVIL,MOTRIN) 200 MG tablet Take 800 mg by mouth every 6 (six) hours as needed. For pain      . metFORMIN (GLUCOPHAGE) 1000 MG tablet Take 1,000 mg by mouth 2 (two) times daily with a meal.       Allergies: Allergies as of 08/02/2012 - Review Complete 08/02/2012  Allergen Reaction Noted  . Tramadol Nausea And Vomiting 12/07/2011   Past Medical History  Diagnosis Date  . Diabetes mellitus, type 2     No insulin  . Chest pain   . Pancreatic cyst     Endoscopic aspiration in 09/2009  . Chronic low back pain     HNP  . Headache   . Depression   . Hypertension    Past Surgical History  Procedure Date    . Abdominal hysterectomy   . Cesarean section    No family history on file. History   Social History  . Marital Status: Single    Spouse Name: N/A    Number of Children: N/A  . Years of Education: N/A   Occupational History  . Not on file.   Social History Main Topics  . Smoking status: Former Games developer  . Smokeless tobacco: Not on file  . Alcohol Use: Yes     Comment: occasional  . Drug Use: No  . Sexually Active: Yes    Birth Control/ Protection: Surgical   Other Topics Concern  . Not on file   Social History Narrative  . No narrative on file    Review of Systems: Pertinent items are noted in HPI.  Physical Exam: Blood pressure 120/69, pulse 98, temperature 98.1 F (36.7 C), temperature source Oral, resp. rate 18, SpO2 98.00%. Vitals reviewed. General: resting in bed, in NAD HEENT: PERRL, EOMI, no scleral icterus. Dry MM Cardiac: RRR, no rubs, murmurs or gallops Pulm: decreased breath sounds on right lower lobe, no wheezes, rales, or rhonchi Abd: soft, nontender, nondistended, BS present Ext: warm and well perfused, no pedal edema Neuro: alert and oriented X3, cranial nerves II-XII grossly intact, strength and sensation to light touch equal in bilateral upper and lower extremities  Lab results: Basic Metabolic Panel:  [facility] 08/02/12 0856  NA 136  K 3.9  CL 103  CO2 21  GLUCOSE 319*  BUN 9  CREATININE 0.60  CALCIUM 8.9  MG --  PHOS --   CBC:  Basename 08/02/12 0856  WBC 10.5  NEUTROABS 8.2*  HGB 13.3  HCT 38.9  MCV 89.8  PLT 201    CBG:  Basename 08/02/12 1640  GLUCAP 250*   Urinalysis:  Basename 08/02/12 1226  COLORURINE YELLOW  LABSPEC 1.041*  PHURINE 6.0  GLUCOSEU >1000*  HGBUR NEGATIVE  BILIRUBINUR NEGATIVE  KETONESUR 15*  PROTEINUR NEGATIVE  UROBILINOGEN 0.2  NITRITE NEGATIVE  LEUKOCYTESUR NEGATIVE     Imaging results:  Dg Chest 2 View  08/02/2012  *RADIOLOGY REPORT*  Clinical Data: Shortness of breath, cough,  congestion.  CHEST - 2 VIEW  Comparison: 12/07/2011  Findings: Right infrahilar atelectasis or infiltrate.  Left lung is clear.  Heart is normal size.  No effusions.  No acute bony abnormality.  IMPRESSION: Right infrahilar atelectasis or infiltrate.   Original Report Authenticated By: Charlett Nose, M.D.     Other results: EKG: NSR  Assessment & Plan by Problem:  CAP. Her vitals are stable, currently she does not meet criteria for sepsis. She does not have leukocytosis and she is afebrile on admission. Her CXR shows right infrahilar infiltrate. She might have had influenza infection, however, she may not benefit from Tamiflu therapy at this stage of her illness. Her pneumonia is likely secondary to her initial infection.  -Inpatient Observation -Blood culture x2 -Sputum culture -Continue Ceftriaxone and azithromycin -Albuterol+Atrovent nebulizer q6 hour -Urine Legionella and Strep pneumoniae  DM2. She does not have a PCP and does not know if her diabetes is well controlled. She is on metformin 1000 BID at home.  -Will hold metformin  -Check Hgb A1C -SSI sensitive with CBG x4 ac and hs  HTN. It is unclear if she truly has hypertension. She is not on anti-hypertensive at home. Her BP on presentation is well controlled.  -Continue to monitor  Dispo: Disposition is deferred at this time, awaiting improvement of current medical problems. Anticipated discharge in approximately 1-2 day(s).   The patient does not have a current PCP; therefore will be requiring OPC follow-up after discharge.   The patient does not have transportation limitations that hinder transportation to clinic appointments.  Signed: Ky Barban 08/02/2012, 4:45 PM

## 2012-08-02 NOTE — ED Provider Notes (Signed)
History     CSN: [id number]  Arrival date & time 08/02/12  0825   None     No chief complaint on file.    HPI chief complaint: Shortness of breath. Onset: More than one week ago. Location: Chest. Not improved or worsened by anything. Timing: Constant. Context: Several family members diagnosed with influenza with similar symptoms. For signs and symptoms to review of systems. Social history: See nurse's notes. Family history of myocardial infarction. I reviewed the patient's past medical, past surgical, social history as well as medications and allergies per  Past Medical History  Diagnosis Date  . Diabetes mellitus, type 2     No insulin  . Chest pain   . Pancreatic cyst     Endoscopic aspiration in 09/2009  . Chronic low back pain     HNP  . Headache   . Depression   . Hypertension     Past Surgical History  Procedure Date  . Abdominal hysterectomy   . Cesarean section     No family history on file.  History  Substance Use Topics  . Smoking status: Former Games developer  . Smokeless tobacco: Not on file  . Alcohol Use: Yes     Comment: occasional    OB History    Grav Para Term Preterm Abortions TAB SAB Ect Mult Living                  Review of Systems  Constitutional: Negative for fever and chills.  HENT: Negative for trouble swallowing, neck pain and neck stiffness.   Eyes: Negative for pain, discharge and itching.  Respiratory: Positive for cough and shortness of breath. Negative for chest tightness, wheezing and stridor.   Cardiovascular: Negative for chest pain, palpitations and leg swelling.  Gastrointestinal: Negative for nausea, vomiting, abdominal pain, diarrhea, constipation and blood in stool.  Genitourinary: Negative for dysuria, urgency, frequency, hematuria, flank pain, decreased urine volume, difficulty urinating and pelvic pain.  Musculoskeletal: Positive for myalgias. Negative for back pain and joint swelling.  Skin: Negative for rash and wound.    Neurological: Negative for dizziness, tremors, seizures, syncope, facial asymmetry, speech difficulty, weakness, light-headedness, numbness and headaches.  Hematological: Negative for adenopathy. Does not bruise/bleed easily.  Psychiatric/Behavioral: Negative for confusion and decreased concentration.    Allergies  Tramadol  Home Medications   Current Outpatient Rx  Name  Route  Sig  Dispense  Refill  . IBUPROFEN 200 MG PO TABS   Oral   Take 800 mg by mouth every 6 (six) hours as needed. For pain         . LISINOPRIL 2.5 MG PO TABS   Oral   Take 2.5 mg by mouth daily.         Marland Kitchen METFORMIN HCL 1000 MG PO TABS   Oral   Take 1,000 mg by mouth 2 (two) times daily with a meal.           There were no vitals taken for this visit.  Physical Exam  Constitutional: She is oriented to person, place, and time. She appears well-developed and well-nourished. No distress.  HENT:  Head: Normocephalic and atraumatic.  Eyes: Conjunctivae normal are normal. Right eye exhibits no discharge. Left eye exhibits no discharge. No scleral icterus.  Neck: Normal range of motion. Neck supple.  Cardiovascular: Normal rate, regular rhythm, normal heart sounds and intact distal pulses.   No murmur heard. Pulmonary/Chest: Effort normal and breath sounds normal. No stridor. No respiratory distress.  She has no wheezes. She has no rales. She exhibits no tenderness.  Abdominal: Soft. Bowel sounds are normal. She exhibits no distension and no mass. There is no tenderness. There is no rebound and no guarding.  Musculoskeletal: Normal range of motion. She exhibits no edema and no tenderness.  Neurological: She is alert and oriented to person, place, and time.  Skin: Skin is warm and dry. She is not diaphoretic.  Psychiatric: She has a normal mood and affect.    ED Course  Procedures (including critical care time)  Labs Reviewed  CBC WITH DIFFERENTIAL - Abnormal; Notable for the following:     Neutrophils Relative 78 (*)     Neutro Abs 8.2 (*)     All other components within normal limits  BASIC METABOLIC PANEL - Abnormal; Notable for the following:    Glucose, Bld 319 (*)     All other components within normal limits  URINALYSIS, ROUTINE W REFLEX MICROSCOPIC - Abnormal; Notable for the following:    Specific Gravity, Urine 1.041 (*)     Glucose, UA >1000 (*)     Ketones, ur 15 (*)     All other components within normal limits  URINE MICROSCOPIC-ADD ON - Abnormal; Notable for the following:    Casts HYALINE CASTS (*)     All other components within normal limits   Dg Chest 2 View  08/02/2012  *RADIOLOGY REPORT*  Clinical Data: Shortness of breath, cough, congestion.  CHEST - 2 VIEW  Comparison: 12/07/2011  Findings: Right infrahilar atelectasis or infiltrate.  Left lung is clear.  Heart is normal size.  No effusions.  No acute bony abnormality.  IMPRESSION: Right infrahilar atelectasis or infiltrate.   Original Report Authenticated By: Charlett Nose, M.D.      No diagnosis found.   EKG reviewed and interpreted: Normal sinus rhythm rate 80. Normal axis. Normal intervals. No T wave abnormalities. No ST segment changes. MDM  Patient is a well-appearing 52 year old female presenting with dry cough, shortness of breath, and generalized body aches for the last several days. Numerous family members have been diagnosed with influenza. Vital stable. Afebrile. No acute distress. Chest x-ray pending at this time.  Chest x-ray consistent with pneumonia. Most likely community acquired. Azithromycin given in the emergency department. Patient was given breathing treatments for the mild shortness of breath which have not improved. Mildly tachypneic still and tachycardic as well so plan to admit.        Consuello Masse, MD 08/02/12 1434

## 2012-08-02 NOTE — ED Notes (Signed)
Returned from xray

## 2012-08-03 ENCOUNTER — Encounter (HOSPITAL_COMMUNITY): Payer: Self-pay | Admitting: General Practice

## 2012-08-03 LAB — BASIC METABOLIC PANEL
BUN: 8 mg/dL (ref 6–23)
CO2: 22 mEq/L (ref 19–32)
Calcium: 8.6 mg/dL (ref 8.4–10.5)
Chloride: 105 mEq/L (ref 96–112)
Creatinine, Ser: 0.55 mg/dL (ref 0.50–1.10)
GFR calc Af Amer: >90 mL/min (ref >90)
GFR calc non Af Amer: >90 mL/min (ref >90)
Glucose, Bld: 226 mg/dL — ABNORMAL HIGH (ref 70–99)
Potassium: 3.5 mEq/L (ref 3.5–5.1)
Sodium: 138 mEq/L (ref 135–145)

## 2012-08-03 LAB — HIV ANTIBODY (ROUTINE TESTING W REFLEX): HIV: NONREACTIVE

## 2012-08-03 LAB — GLUCOSE, CAPILLARY
Glucose-Capillary: 216 mg/dL — ABNORMAL HIGH (ref 70–99)
Glucose-Capillary: 248 mg/dL — ABNORMAL HIGH (ref 70–99)
Glucose-Capillary: 207 mg/dL — ABNORMAL HIGH (ref 70–99)
Glucose-Capillary: 159 mg/dL — ABNORMAL HIGH (ref 70–99)

## 2012-08-03 LAB — CBC
HCT: 34.3 % — ABNORMAL LOW (ref 36.0–46.0)
Hemoglobin: 11.6 g/dL — ABNORMAL LOW (ref 12.0–15.0)
MCH: 30.8 pg (ref 26.0–34.0)
MCHC: 33.8 g/dL (ref 30.0–36.0)
MCV: 91 fL (ref 78.0–100.0)
Platelets: 200 10*3/uL (ref 150–400)
RBC: 3.77 MIL/uL — ABNORMAL LOW (ref 3.87–5.11)
RDW: 12.8 % (ref 11.5–15.5)
WBC: 11.9 10*3/uL — ABNORMAL HIGH (ref 4.0–10.5)

## 2012-08-03 LAB — INFLUENZA PANEL BY PCR (TYPE A & B)
H1N1 flu by pcr: NOT DETECTED
Influenza A By PCR: NEGATIVE
Influenza B By PCR: NEGATIVE

## 2012-08-03 LAB — LEGIONELLA ANTIGEN, URINE: Legionella Antigen, Urine: NEGATIVE

## 2012-08-03 MED ORDER — OSELTAMIVIR PHOSPHATE 75 MG PO CAPS
75.0000 mg | ORAL_CAPSULE | Freq: Two times a day (BID) | ORAL | Status: DC
  Administered 2012-08-03 – 2012-08-04 (×2): 75 mg via ORAL
  Filled 2012-08-03 (×3): qty 1

## 2012-08-03 MED ORDER — SODIUM CHLORIDE 0.9 % IV BOLUS (SEPSIS)
500.0000 mL | Freq: Once | INTRAVENOUS | Status: AC
  Administered 2012-08-03: 500 mL via INTRAVENOUS

## 2012-08-03 MED ORDER — INSULIN ASPART 100 UNIT/ML ~~LOC~~ SOLN
0.0000 [IU] | Freq: Three times a day (TID) | SUBCUTANEOUS | Status: DC
  Administered 2012-08-03: 3 [IU] via SUBCUTANEOUS
  Administered 2012-08-04: 5 [IU] via SUBCUTANEOUS
  Administered 2012-08-05 (×2): 2 [IU] via SUBCUTANEOUS

## 2012-08-03 MED ORDER — IBUPROFEN 400 MG PO TABS
400.0000 mg | ORAL_TABLET | Freq: Once | ORAL | Status: AC
  Administered 2012-08-03: 400 mg via ORAL
  Filled 2012-08-03 (×2): qty 1

## 2012-08-03 MED ORDER — GUAIFENESIN-DM 100-10 MG/5ML PO SYRP
5.0000 mL | ORAL_SOLUTION | ORAL | Status: DC | PRN
  Administered 2012-08-03 (×2): 5 mL via ORAL
  Filled 2012-08-03 (×2): qty 5

## 2012-08-03 MED ORDER — INSULIN GLARGINE 100 UNIT/ML ~~LOC~~ SOLN
15.0000 [IU] | Freq: Every day | SUBCUTANEOUS | Status: DC
  Administered 2012-08-03 – 2012-08-05 (×3): 15 [IU] via SUBCUTANEOUS

## 2012-08-03 MED ORDER — HYDROCODONE-HOMATROPINE 5-1.5 MG/5ML PO SYRP
5.0000 mL | ORAL_SOLUTION | ORAL | Status: DC | PRN
  Administered 2012-08-03 – 2012-08-05 (×4): 5 mL via ORAL
  Filled 2012-08-03 (×4): qty 5

## 2012-08-03 MED ORDER — INSULIN ASPART 100 UNIT/ML ~~LOC~~ SOLN
3.0000 [IU] | Freq: Three times a day (TID) | SUBCUTANEOUS | Status: DC
  Administered 2012-08-04 – 2012-08-05 (×3): 3 [IU] via SUBCUTANEOUS

## 2012-08-03 MED ORDER — POTASSIUM CHLORIDE IN NACL 20-0.9 MEQ/L-% IV SOLN
INTRAVENOUS | Status: AC
  Administered 2012-08-03: 12:00:00 via INTRAVENOUS
  Filled 2012-08-03: qty 1000

## 2012-08-03 MED ORDER — ALBUTEROL SULFATE HFA 108 (90 BASE) MCG/ACT IN AERS
1.0000 | INHALATION_SPRAY | Freq: Four times a day (QID) | RESPIRATORY_TRACT | Status: DC | PRN
Start: 2012-08-03 — End: 2012-08-03

## 2012-08-03 NOTE — H&P (Signed)
INTERNAL MEDICINE TEACHING SERVICE Attending Admission Note  Date: 08/03/2012  Patient name: [name] [name]  Medical record number: [id number]  Date of birth: 01/30/1961    I have seen and evaluated [name] [name] and discussed their care with the Residency Team.  51 yr. Old female with hx DM, HTN, presented with cough and malaise. She states she has been having symptoms for the past two weeks, which include cough and shortness of breath.  She states she had a sick contact with a family member that may have had "the flu".  She has been taking mucinex without improvement.  CXR shows possibly a right infrahilar infiltrate.  H1N1 by PCR was negative.   This morning she feels slightly better, though she admits her cough is still present. Admits to minimal SOB.  She admits to diffuse "body aches".   Physical Exam: Blood pressure 139/80, pulse 130, temperature 100.2 F (37.9 C), temperature source Oral, resp. rate 18, weight 176 lb 6.4 oz (80.015 kg), SpO2 95.00%.  General: Vital signs reviewed and noted. Well-developed, well-nourished, in no acute distress; alert, appropriate and cooperative throughout examination.  Head: Normocephalic, atraumatic.  Eyes: PERRL, EOMI, No signs of anemia or jaundince.  Nose: Mucous membranes moist, not inflammed, nonerythematous.  Throat: Oropharynx nonerythematous, no exudate appreciated.   Neck: No deformities, masses, or tenderness noted.Supple, No carotid Bruits, no JVD.  Lungs:  Normal respiratory effort. Clear to auscultation BL without crackles or wheezes.  Heart: RRR. S1 and S2 normal without gallop, murmur, or rubs.  Abdomen:  BS normoactive. Soft, Nondistended, non-tender.  No masses or organomegaly.  Extremities: No pretibial edema.  Neurologic: A&O X3, CN II - XII are grossly intact. Motor strength is 5/5 in the all 4 extremities, Sensations intact to light touch, Cerebellar signs negative.  Skin: No visible rashes, scars.    Lab results: Results for  orders placed during the hospital encounter of 08/02/12 (from the past 24 hour(s))  GLUCOSE, CAPILLARY     Status: Abnormal   Collection Time   08/02/12  4:40 PM      Component Value Range   Glucose-Capillary 250 (*) 70 - 99 mg/dL  LEGIONELLA ANTIGEN, URINE     Status: Normal   Collection Time   08/02/12  5:54 PM      Component Value Range   Specimen Description URINE, RANDOM     Special Requests NONE     Legionella Antigen, Urine Negative for Legionella pneumophilia serogroup 1     Report Status 08/03/2012 FINAL    STREP PNEUMONIAE URINARY ANTIGEN     Status: Normal   Collection Time   08/02/12  5:54 PM      Component Value Range   Strep Pneumo Urinary Antigen NEGATIVE  NEGATIVE  GLUCOSE, CAPILLARY     Status: Abnormal   Collection Time   08/02/12  8:47 PM      Component Value Range   Glucose-Capillary 179 (*) 70 - 99 mg/dL  INFLUENZA PANEL BY PCR     Status: Normal   Collection Time   08/02/12  9:10 PM      Component Value Range   Influenza A By PCR NEGATIVE  NEGATIVE   Influenza B By PCR NEGATIVE  NEGATIVE   H1N1 flu by pcr NOT DETECTED  NOT DETECTED  BASIC METABOLIC PANEL     Status: Abnormal   Collection Time   08/03/12  7:05 AM      Component Value Range   Sodium 138  135 -  145 mEq/L   Potassium 3.5  3.5 - 5.1 mEq/L   Chloride 105  96 - 112 mEq/L   CO2 22  19 - 32 mEq/L   Glucose, Bld 226 (*) 70 - 99 mg/dL   BUN 8  6 - 23 mg/dL   Creatinine, Ser 2.95  0.50 - 1.10 mg/dL   Calcium 8.6  8.4 - 28.4 mg/dL   GFR calc non Af Amer >90  >90 mL/min   GFR calc Af Amer >90  >90 mL/min  CBC     Status: Abnormal   Collection Time   08/03/12  7:05 AM      Component Value Range   WBC 11.9 (*) 4.0 - 10.5 K/uL   RBC 3.77 (*) 3.87 - 5.11 MIL/uL   Hemoglobin 11.6 (*) 12.0 - 15.0 g/dL   HCT 13.2 (*) 44.0 - 10.2 %   MCV 91.0  78.0 - 100.0 fL   MCH 30.8  26.0 - 34.0 pg   MCHC 33.8  30.0 - 36.0 g/dL   RDW 72.5  36.6 - 44.0 %   Platelets 200  150 - 400 K/uL  GLUCOSE, CAPILLARY      Status: Abnormal   Collection Time   08/03/12  7:57 AM      Component Value Range   Glucose-Capillary 216 (*) 70 - 99 mg/dL   Comment 1 Notify RN    GLUCOSE, CAPILLARY     Status: Abnormal   Collection Time   08/03/12 11:42 AM      Component Value Range   Glucose-Capillary 248 (*) 70 - 99 mg/dL   Comment 1 Notify RN      Imaging results:  Dg Chest 2 View  08/02/2012  *RADIOLOGY REPORT*  Clinical Data: Shortness of breath, cough, congestion.  CHEST - 2 VIEW  Comparison: 12/07/2011  Findings: Right infrahilar atelectasis or infiltrate.  Left lung is clear.  Heart is normal size.  No effusions.  No acute bony abnormality.  IMPRESSION: Right infrahilar atelectasis or infiltrate.   Original Report Authenticated By: Charlett Nose, M.D.      Assessment and Plan: I agree with the formulated Assessment and Plan with the following changes: 51 yr. Old female with hx DM, HTN, presented with cough and malaise, with CAP. 1) CAP: This afternoon noted to have a temp of 100.2. Although she has a possible infiltrate, given her recent sick contacts and complaints of diffuse body aches, I would go ahead and treat her with five days of tamiflu. I would continue treatment for CAP with Rocephin and azithro for now. Although her PCR flu is negative, I would not solely rely on this.  Not currently hypoxic, follow clinically. 2) Sinus tach: likely secondary to albuterol, but may be due to #1. Discussed with RN and she states she became tachy right after albuterol treatment. D/C albuterol.   3) DM: uncontrolled. She may need assistance to help with affording meds. Agree with metformin and Lantus for now. Watch for hypoglycemia.    Jonah Blue, [place] 1/15/20144:09 PM

## 2012-08-03 NOTE — ED Provider Notes (Signed)
I saw and evaluated the patient, reviewed the resident's note and I agree with the findings and plan. I reviewed and agree with ECG interpretation.  Pt with more than 1 week of URI symptoms, persistent cough, malaise, chills.  Pt with evidence of pneumonia on CXR with left shift, poorly controlled DM, but not in DKA.  Pt also with occasional borderline low BP's in the ED.  Pt would benefit from brief admission for respiratory support, IV abx and glucose stabilization and assurance of proper follow up as she only goes to the [facility] health department.  Pt is given nebs, IVF's, IV abx in the ED as well as some symptom control.  Imp:  CAP Hyperglycemia FTT  Gavin Pound. Oletta Lamas, MD 08/03/12 [phone]

## 2012-08-03 NOTE — Progress Notes (Addendum)
Subjective: She still feels tired today, with malaise, "pain all over". Her cough is still nonproductive. She is tolerating intake per mouth well but has decreased appetite, having to "force" herself to eat or drink anything. This afternoon she felt dizzy when getting up to go to the bathroom.   She denies chest pain, shortness of breath, abdominal pain, dysuria, or diarrhea.   Objective: Vital signs in last 24 hours: Filed Vitals:   08/03/12 0553 08/03/12 1155 08/03/12 1303 08/03/12 1354  BP: 92/47 110/60 113/71   Pulse: 92 105 98   Temp: 98 F (36.7 C)  100.2 F (37.9 C)   TempSrc: Oral  Oral   Resp:   18   Weight: 176 lb 6.4 oz (80.015 kg)     SpO2: 95%  96% 93%   Weight change:   Intake/Output Summary (Last 24 hours) at 08/03/12 1401 Last data filed at 08/03/12 1300  Gross per 24 hour  Intake   1798 ml  Output    400 ml  Net   1398 ml   General: resting in bed, in NAD  HEENT: PERRL, EOMI, no scleral icterus. Dry MM  Cardiac: RRR, no rubs, murmurs or gallops  Pulm: expiratory rhonchi heard best at the right lower lung field with transient soft wheezes. Poor effort but no use of accessory muscles, no respiratory distress.  Abd: soft, nontender, nondistended, BS present  Ext: warm and well perfused, no pedal edema  Neuro: alert and oriented X3, cranial nerves II-XII grossly intact, strength and sensation to light touch equal in bilateral upper and lower extremities  Lab Results: Basic Metabolic Panel:  Lab 08/03/12 1914 08/02/12 0856  NA 138 136  K 3.5 3.9  CL 105 103  CO2 22 21  GLUCOSE 226* 319*  BUN 8 9  CREATININE 0.55 0.60  CALCIUM 8.6 8.9  MG -- --  PHOS -- --   CBC:  Lab 08/03/12 0705 08/02/12 0856  WBC 11.9* 10.5  NEUTROABS -- 8.2*  HGB 11.6* 13.3  HCT 34.3* 38.9  MCV 91.0 89.8  PLT 200 201   CBG:  Lab 08/03/12 1142 08/03/12 0757 08/02/12 2047 08/02/12 1640  GLUCAP 248* 216* 179* 250*   Hemoglobin A1C:  Lab 08/02/12 1530  HGBA1C 10.0*    Urinalysis:  Lab 08/02/12 1226  COLORURINE YELLOW  LABSPEC 1.041*  PHURINE 6.0  GLUCOSEU >1000*  HGBUR NEGATIVE  BILIRUBINUR NEGATIVE  KETONESUR 15*  PROTEINUR NEGATIVE  UROBILINOGEN 0.2  NITRITE NEGATIVE  LEUKOCYTESUR NEGATIVE   Micro Results: No results found for this or any previous visit (from the past 240 hour(s)). Studies/Results: Dg Chest 2 View  08/02/2012  *RADIOLOGY REPORT*  Clinical Data: Shortness of breath, cough, congestion.  CHEST - 2 VIEW  Comparison: 12/07/2011  Findings: Right infrahilar atelectasis or infiltrate.  Left lung is clear.  Heart is normal size.  No effusions.  No acute bony abnormality.  IMPRESSION: Right infrahilar atelectasis or infiltrate.   Original Report Authenticated By: Charlett Nose, M.D.    Medications: I have reviewed the patient's current medications. Scheduled Meds:   . albuterol  2.5 mg Nebulization Q6H  . azithromycin  500 mg Oral Q24H  . cefTRIAXone (ROCEPHIN)  IV  1 g Intravenous Q24H  . enoxaparin (LOVENOX) injection  40 mg Subcutaneous Q24H  . insulin aspart  0-9 Units Subcutaneous TID WC  . insulin aspart  3 Units Subcutaneous TID WC  . insulin glargine  15 Units Subcutaneous Daily  . ipratropium  0.5 mg  Nebulization Q6H  . sodium chloride  3 mL Intravenous Q12H   Continuous Infusions:   . 0.9 % NaCl with KCl 20 mEq / L 100 mL/hr at 08/03/12 1222   PRN Meds:.acetaminophen, acetaminophen, HYDROcodone-homatropine Assessment/Plan: CAP. She does not have leukocytosis and she is afebrile on admission. Her CXR shows right infrahilar infiltrate. She still feels tired today with a nonproductive cough. Her BP has been low normal and she felt dizzy today. Urine Strep pneumoniae antigen negative. Blood culture x2 pending. Sputum culture to be collected -Continue Ceftriaxone and azithromycin  -Discontinued Albuterol+Atrovent nebulizer q6 hour as pt noticed no improvement and had worsened tachycardia -Albuterol inhaler q6h PRN for  wheezing or SOB -incentive spirometry -hycodan for cough suppresion   DM2, Uncontrolled. Hgb A1C. She does not have a PCP and does not know if her diabetes is well controlled. She is on metformin 1000 BID at home. She likely will need insulin therapy for short-term, she is willing to follow up with the [facility].  -Will hold metformin  -SSI sensitive with CBG x4 ac and hs  -Added Lantus 15 unit daily and Novolog 3 unit ac for meal coverage  HTN. It is unclear if she truly has hypertension. She is not on anti-hypertensive at home. Her BP on presentation is well controlled with question of possible orthostatic hypotension.  -Continue to monitor   Dispo: Disposition is deferred at this time, awaiting improvement of current medical problems.  Anticipated discharge in approximately 1-2 day(s).   The patient does not have a current PCP, therefore will be requiring OPC follow-up after discharge. She states that she has had financial hardship, having lost her job and having to move from [facility] to stay with her daughter here in [place]. She plans to return to [facility] eventually but would like to follow up with the [facility] clinic while she is here.   The patient does not have transportation limitations that hinder transportation to clinic appointments.  .Services Needed at time of discharge: Y = Yes, Blank = No PT:   OT:   RN:   Equipment:   Other:     LOS: 1 day   Sara Chu D 08/03/2012, 2:01 PM

## 2012-08-03 NOTE — Progress Notes (Addendum)
Inpatient Diabetes Program Recommendations  AACE/ADA: New Consensus Statement on Inpatient Glycemic Control (2013)  Target Ranges:  Prepandial:   less than 140 mg/dL      Peak postprandial:   less than 180 mg/dL (1-2 hours)      Critically ill patients:  140 - 180 mg/dL   Reason for Visit: Note history of diabetes.  A1C=10.0% indicating poor control.  Results for [name] [name], [name] [name] (MRN [id number]) as of 08/03/2012 13:06  Ref. Range 08/02/2012 20:47 08/03/2012 07:57 08/03/2012 11:42  Glucose-Capillary Latest Range: 70-99 mg/dL 621 (H) 308 (H) 657 (H)   Please consider adding Lantus 15 units daily and Novolog meal coverage 3 units tid with meals while in the hospital.  She will need significant follow-up with PCP after discharge and adjustments to home diabetes medication regimen.    1500 Attempted to talk to patient regarding home diabetes control.  She was not feeling well.  Will follow-up on 08/04/12.

## 2012-08-03 NOTE — Progress Notes (Signed)
Pt ambulated to BR. Pt stated that she felt "woozie and lightheaded with general bad". Dr. Garald Braver paged to make aware. VSS. Will continue to monitor. Levonne Spiller, RN

## 2012-08-04 LAB — GLUCOSE, CAPILLARY
Glucose-Capillary: 151 mg/dL — ABNORMAL HIGH (ref 70–99)
Glucose-Capillary: 266 mg/dL — ABNORMAL HIGH (ref 70–99)
Glucose-Capillary: 88 mg/dL (ref 70–99)
Glucose-Capillary: 251 mg/dL — ABNORMAL HIGH (ref 70–99)

## 2012-08-04 LAB — BASIC METABOLIC PANEL
BUN: 8 mg/dL (ref 6–23)
CO2: 21 mEq/L (ref 19–32)
Calcium: 8.7 mg/dL (ref 8.4–10.5)
Chloride: 103 mEq/L (ref 96–112)
Creatinine, Ser: 0.57 mg/dL (ref 0.50–1.10)
GFR calc Af Amer: >90 mL/min (ref >90)
GFR calc non Af Amer: >90 mL/min (ref >90)
Glucose, Bld: 151 mg/dL — ABNORMAL HIGH (ref 70–99)
Potassium: 3.8 mEq/L (ref 3.5–5.1)
Sodium: 136 mEq/L (ref 135–145)

## 2012-08-04 LAB — CBC
HCT: 35.4 % — ABNORMAL LOW (ref 36.0–46.0)
Hemoglobin: 12.3 g/dL (ref 12.0–15.0)
MCH: 31.5 pg (ref 26.0–34.0)
MCHC: 34.7 g/dL (ref 30.0–36.0)
MCV: 90.5 fL (ref 78.0–100.0)
Platelets: 224 10*3/uL (ref 150–400)
RBC: 3.91 MIL/uL (ref 3.87–5.11)
RDW: 12.9 % (ref 11.5–15.5)
WBC: 11.4 10*3/uL — ABNORMAL HIGH (ref 4.0–10.5)

## 2012-08-04 MED ORDER — LIVING WELL WITH DIABETES BOOK
Freq: Once | Status: AC
  Administered 2012-08-04: 11:00:00
  Filled 2012-08-04: qty 1

## 2012-08-04 MED ORDER — SALINE SPRAY 0.65 % NA SOLN
1.0000 | NASAL | Status: DC | PRN
  Filled 2012-08-04: qty 44

## 2012-08-04 MED ORDER — INSULIN PEN STARTER KIT
1.0000 | Freq: Once | Status: AC
  Administered 2012-08-04: 1
  Filled 2012-08-04: qty 1

## 2012-08-04 NOTE — Progress Notes (Signed)
1/16  Spoke with Dr. Garald Braver and internal medicine team about the patient after their visit with her.  After assessment, the patient will probably go home on oral medication and be followed by the internal medicine clinic as outpatient. Spoke with nurse assigned to patient after speaking with IM team.  Smith Mince RN BSN CDE

## 2012-08-04 NOTE — Progress Notes (Signed)
INTERNAL MEDICINE TEACHING SERVICE Attending Note  Date: 08/04/2012  Patient name: Palestine Mosco  Medical record number: [id number]  Date of birth: [birth year]    This patient has been seen and discussed with the house staff. Please see their note for complete details. I concur with their findings with the following additions/corrections: Episodes of fever yesterday night. Some dizziness when getting up but no orthostatic BP changes noted.  SOB improved. Overall, clinically improved. 51 yr. Old female with hx DM, HTN, presented with cough and malaise, with CAP.  1) CAP: Fever last night. Given her improvement it's ok to stop tamiflu and watch her. Continue course of azithro and rocephin, then PO azithro on D/C tomm. Ambulate on RA to make sure no hypoxia. 2) DM: uncontrolled. She may need assistance to help with affording meds.Discuss with her and CM what she can afford. May need insulin to get her A1c down and then just oral therapy. If cannot afford insulin, give metformin and glipizide.  Jonah Blue, DO  08/04/2012, 2:48 PM

## 2012-08-04 NOTE — Progress Notes (Signed)
Visit to patient while in hospital. Will call after she is discharged to assist with transition of care. Patient agreeable to learn to inject insulin if needed. She says 25$/month at [facility] is affordable. Spoke with nurse who is willing to instruct patient. Also, note patient received two 15 unit doses of lantus in past 24 hours and 8 units Novolog with lunch for CBG of 266 and ate ~ 50% of meal. Discussed with patient and nurse

## 2012-08-04 NOTE — Progress Notes (Signed)
1/16  Received Diabetes Coordinator consult.  Spoke with patient's nurse.  Will have patient give own insulin injection at noon and nurse will teach insulin administration with syringe and vial.   Concern that patient does not have insurance.  Will patient be followed in internal medicine clinic?  If going home on Lantus, will patient be able to get medication assistance?  Will follow up with patient and staff nurse.  Smith Mince RN BSN CDE

## 2012-08-04 NOTE — Progress Notes (Signed)
Subjective: She had a fever of 101.17F overnight that subsided with Tylenol. She feels better today with improved breathing. Her intake per mouth is still poor. She felt dizzy this morning when going to the bathroom.   She denies chest pain, abdominal pain, dysuria, or diarrhea.   Objective: Vital signs in last 24 hours: Filed Vitals:   08/03/12 2015 08/03/12 2132 08/03/12 2350 08/04/12 0516  BP: 111/60   103/59  Pulse: 117   83  Temp: 101.1 F (38.4 C) 101.7 F (38.7 C) 97.9 F (36.6 C) 98.2 F (36.8 C)  TempSrc: Oral Oral Oral Oral  Resp:      Height:      Weight:      SpO2: 93%   96%   Weight change:   Intake/Output Summary (Last 24 hours) at 08/04/12 1352 Last data filed at 08/04/12 1016  Gross per 24 hour  Intake    479 ml  Output    900 ml  Net   -421 ml  Vitals reviewed.  General: resting in bed, in NAD  HEENT: PERRL, EOMI, no scleral icterus. Dry MM  Cardiac: RRR, no rubs, murmurs or gallops  Pulm: Clear to auscultation bilaterally, much improved from yesterday.  Abd: soft, nontender, nondistended, BS present  Ext: warm and well perfused, no pedal edema  Neuro: alert and oriented X3, cranial nerves II-XII grossly intact, strength and sensation to light touch equal in bilateral upper and lower extremities  Lab Results: Basic Metabolic Panel:  Lab 08/03/12 1610 08/02/12 0856  NA 138 136  K 3.5 3.9  CL 105 103  CO2 22 21  GLUCOSE 226* 319*  BUN 8 9  CREATININE 0.55 0.60  CALCIUM 8.6 8.9  MG -- --  PHOS -- --   CBC:  Lab 08/03/12 0705 08/02/12 0856  WBC 11.9* 10.5  NEUTROABS -- 8.2*  HGB 11.6* 13.3  HCT 34.3* 38.9  MCV 91.0 89.8  PLT 200 201   CBG:  Lab 08/04/12 1142 08/04/12 0732 08/03/12 2020 08/03/12 1659 08/03/12 1142 08/03/12 0757  GLUCAP 266* 151* 159* 207* 248* 216*   Hemoglobin A1C:  Lab 08/02/12 1530  HGBA1C 10.0*   Urinalysis:  Lab 08/02/12 1226  COLORURINE YELLOW  LABSPEC 1.041*  PHURINE 6.0  GLUCOSEU >1000*  HGBUR  NEGATIVE  BILIRUBINUR NEGATIVE  KETONESUR 15*  PROTEINUR NEGATIVE  UROBILINOGEN 0.2  NITRITE NEGATIVE  LEUKOCYTESUR NEGATIVE    Micro Results: Recent Results (from the past 240 hour(s))  CULTURE, BLOOD (ROUTINE X 2)     Status: Normal (Preliminary result)   Collection Time   08/02/12  3:30 PM      Component Value Range Status Comment   Specimen Description BLOOD RIGHT ARM   Final    Special Requests     Final    Value: BOTTLES DRAWN AEROBIC AND ANAEROBIC 10CC AER,9CC ANA   Culture  Setup Time 08/03/2012 00:45   Final    Culture     Final    Value:        BLOOD CULTURE RECEIVED NO GROWTH TO DATE CULTURE WILL BE HELD FOR 5 DAYS BEFORE ISSUING A FINAL NEGATIVE REPORT   Report Status PENDING   Incomplete   CULTURE, BLOOD (ROUTINE X 2)     Status: Normal (Preliminary result)   Collection Time   08/02/12  3:47 PM      Component Value Range Status Comment   Specimen Description BLOOD RIGHT ARM   Final    Special Requests BOTTLES  DRAWN AEROBIC AND ANAEROBIC 10CC   Final    Culture  Setup Time 08/03/2012 00:45   Final    Culture     Final    Value:        BLOOD CULTURE RECEIVED NO GROWTH TO DATE CULTURE WILL BE HELD FOR 5 DAYS BEFORE ISSUING A FINAL NEGATIVE REPORT   Report Status PENDING   Incomplete    Studies/Results: No results found. Medications: I have reviewed the patient's current medications. Scheduled Meds:   . azithromycin  500 mg Oral Q24H  . cefTRIAXone (ROCEPHIN)  IV  1 g Intravenous Q24H  . enoxaparin (LOVENOX) injection  40 mg Subcutaneous Q24H  . insulin aspart  0-9 Units Subcutaneous TID WC  . insulin aspart  3 Units Subcutaneous TID WC  . insulin glargine  15 Units Subcutaneous Daily  . sodium chloride  3 mL Intravenous Q12H   Continuous Infusions:  PRN Meds:.acetaminophen, acetaminophen, HYDROcodone-homatropine, sodium chloride Assessment/Plan: CAP. Her CXR on admission shows right infrahilar infiltrate. Urine Strep pneumoniae antigen negative. Blood culture  x2 pending. Sputum culture to be collected. She was febrile overnight with Tmax of 101.71F, she had a leukocytosis with WBC of 11.9. She feels better today with improved cough but has continued poor intake per mouth.  Her BP has been low normal and she felt dizzy again today.  -Continue Ceftriaxone and azithromycin  -Discontinued Albuterol+Atrovent nebulizer, her tachycardia is resolved today -Albuterol inhaler q6h PRN for wheezing or SOB  -incentive spirometry  -hycodan for cough suppresion   DM2, Uncontrolled. Hgb A1C of 10. She does not have a PCP and does not know if her diabetes is well controlled. She is on metformin 1000 BID at home. She likely will need insulin therapy for short-term, she is willing to follow up with the [facility] to see her as inpatient and help with supplies for possible short-term insulin treatment immediately after her discharge.  -Will hold metformin  -SSI sensitive with CBG x4 ac and hs  -Added Lantus 15 unit daily and Novolog 3 unit ac for meal coverage   HTN. It is unclear if she truly has hypertension. She is not on anti-hypertensive at home. Her BP on presentation is well controlled with orthostatic hypotension persistent to today as she is dizzy from sitting to standing.  -Continue to monitor   Dispo: Disposition is deferred at this time, awaiting improvement of current medical problems. Anticipated discharge in approximately tomorrow.   The patient does not have a current PCP, therefore will be requiring OPC follow-up after discharge. She states that she has had financial hardship, having lost her job and having to move from [facility] to stay with her daughter here in [place]. She plans to return to [facility] eventually but would like to follow up with the [facility] clinic while she is here.  The patient does not have transportation limitations that hinder transportation to clinic appointments.  .Services Needed at time of discharge: Y = Yes, Blank  = No  PT:    OT:    RN:    Equipment:    Other:        LOS: 2 days   [name] [name] D 08/04/2012, 1:52 PM

## 2012-08-05 LAB — BASIC METABOLIC PANEL
BUN: 8 mg/dL (ref 6–23)
CO2: 22 mEq/L (ref 19–32)
Calcium: 8.8 mg/dL (ref 8.4–10.5)
Chloride: 101 mEq/L (ref 96–112)
Creatinine, Ser: 0.52 mg/dL (ref 0.50–1.10)
GFR calc Af Amer: >90 mL/min (ref >90)
GFR calc non Af Amer: >90 mL/min (ref >90)
Glucose, Bld: 333 mg/dL — ABNORMAL HIGH (ref 70–99)
Potassium: 4 mEq/L (ref 3.5–5.1)
Sodium: 135 mEq/L (ref 135–145)

## 2012-08-05 LAB — GLUCOSE, CAPILLARY
Glucose-Capillary: 161 mg/dL — ABNORMAL HIGH (ref 70–99)
Glucose-Capillary: 176 mg/dL — ABNORMAL HIGH (ref 70–99)

## 2012-08-05 LAB — CBC
HCT: 36.4 % (ref 36.0–46.0)
Hemoglobin: 12.4 g/dL (ref 12.0–15.0)
MCH: 30.9 pg (ref 26.0–34.0)
MCHC: 34.1 g/dL (ref 30.0–36.0)
MCV: 90.8 fL (ref 78.0–100.0)
Platelets: 242 10*3/uL (ref 150–400)
RBC: 4.01 MIL/uL (ref 3.87–5.11)
RDW: 12.7 % (ref 11.5–15.5)
WBC: 9 10*3/uL (ref 4.0–10.5)

## 2012-08-05 MED ORDER — AZITHROMYCIN 500 MG PO TABS: 500.0000 mg | ORAL_TABLET | ORAL | Status: DC

## 2012-08-05 MED ORDER — INSULIN NPH (HUMAN) (ISOPHANE) 100 UNIT/ML ~~LOC~~ SUSP: 10.0000 [IU] | Freq: Every day | SUBCUTANEOUS | Status: DC

## 2012-08-05 NOTE — Progress Notes (Signed)
Subjective: She feels better today with no dizziness. Her cough is improving as well her intake per mouth.   She denies chest pain, abdominal pain, dysuria, or diarrhea.   Objective: Vital signs in last 24 hours: Filed Vitals:   08/04/12 1425 08/04/12 1500 08/04/12 2100 08/05/12 0521  BP: 115/69  105/67 88/60  Pulse: 99  103 95  Temp:   99.9 F (37.7 C) 98.6 F (37 C)  TempSrc:   Oral Oral  Resp:   18 18  Height:      Weight:      SpO2: 92% 94% 90% 94%   Weight change:   Intake/Output Summary (Last 24 hours) at 08/05/12 1031 Last data filed at 08/05/12 0900  Gross per 24 hour  Intake    243 ml  Output    900 ml  Net   -657 ml   Vitals reviewed.  General: resting in bed, in NAD  HEENT: PERRL, EOMI, no scleral icterus. MMM  Cardiac: RRR, no rubs, murmurs or gallops  Pulm: Clear to auscultation bilaterally, much improved from yesterday.  Abd: soft, nontender, nondistended, BS present  Ext: warm and well perfused, no pedal edema  Neuro: alert and oriented X3, cranial nerves II-XII grossly intact, strength and sensation to light touch equal in bilateral upper and lower extremities  Lab Results: Basic Metabolic Panel:  Lab 08/05/12 4540 08/04/12 1424  NA 135 136  K 4.0 3.8  CL 101 103  CO2 22 21  GLUCOSE 333* 151*  BUN 8 8  CREATININE 0.52 0.57  CALCIUM 8.8 8.7  MG -- --  PHOS -- --   CBC:  Lab 08/05/12 0851 08/04/12 1424 08/02/12 0856  WBC 9.0 11.4* --  NEUTROABS -- -- 8.2*  HGB 12.4 12.3 --  HCT 36.4 35.4* --  MCV 90.8 90.5 --  PLT 242 224 --    CBG:  Lab 08/05/12 0746 08/04/12 2123 08/04/12 1645 08/04/12 1142 08/04/12 0732 08/03/12 2020  GLUCAP 161* 251* 88 266* 151* 159*   Hemoglobin A1C:  Lab 08/02/12 1530  HGBA1C 10.0*   Urinalysis:  Lab 08/02/12 1226  COLORURINE YELLOW  LABSPEC 1.041*  PHURINE 6.0  GLUCOSEU >1000*  HGBUR NEGATIVE  BILIRUBINUR NEGATIVE  KETONESUR 15*  PROTEINUR NEGATIVE  UROBILINOGEN 0.2  NITRITE NEGATIVE    LEUKOCYTESUR NEGATIVE     Micro Results: Recent Results (from the past 240 hour(s))  CULTURE, BLOOD (ROUTINE X 2)     Status: Normal (Preliminary result)   Collection Time   08/02/12  3:30 PM      Component Value Range Status Comment   Specimen Description BLOOD RIGHT ARM   Final    Special Requests     Final    Value: BOTTLES DRAWN AEROBIC AND ANAEROBIC 10CC AER,9CC ANA   Culture  Setup Time 08/03/2012 00:45   Final    Culture     Final    Value:        BLOOD CULTURE RECEIVED NO GROWTH TO DATE CULTURE WILL BE HELD FOR 5 DAYS BEFORE ISSUING A FINAL NEGATIVE REPORT   Report Status PENDING   Incomplete   CULTURE, BLOOD (ROUTINE X 2)     Status: Normal (Preliminary result)   Collection Time   08/02/12  3:47 PM      Component Value Range Status Comment   Specimen Description BLOOD RIGHT ARM   Final    Special Requests BOTTLES DRAWN AEROBIC AND ANAEROBIC 10CC   Final    Culture  Setup Time 08/03/2012 00:45   Final    Culture     Final    Value:        BLOOD CULTURE RECEIVED NO GROWTH TO DATE CULTURE WILL BE HELD FOR 5 DAYS BEFORE ISSUING A FINAL NEGATIVE REPORT   Report Status PENDING   Incomplete    Studies/Results: No results found. Medications: I have reviewed the patient's current medications. Scheduled Meds:   . azithromycin  500 mg Oral Q24H  . cefTRIAXone (ROCEPHIN)  IV  1 g Intravenous Q24H  . enoxaparin (LOVENOX) injection  40 mg Subcutaneous Q24H  . insulin aspart  0-9 Units Subcutaneous TID WC  . insulin aspart  3 Units Subcutaneous TID WC  . insulin glargine  15 Units Subcutaneous Daily  . sodium chloride  3 mL Intravenous Q12H   Continuous Infusions:  PRN Meds:.acetaminophen, acetaminophen, HYDROcodone-homatropine, sodium chloride Assessment/Plan: CAP. Her CXR on admission shows right infrahilar infiltrate. Urine Strep pneumoniae and Legionella antigen negative. Blood culture x2 NGTD. Sputum culture to be collected. She was afebrile overnight, her leucocytosis is  trending down. She feels better today with improved intake per mouth and no dizziness. She will be discharged today.  -Discontinue Ceftriaxone and azithromycin  -Albuterol inhaler q6h PRN for wheezing or SOB but she has not needed it -incentive spirometry  -hycodan for cough suppresion   DM2, Uncontrolled. Hgb A1C of 10. She is on metformin 1000 BID at home. She does not have a PCP and will follow up at the [facility]. She met with Lorelee New who gave her syringes and needles. The patient is agreeable to starting Novolin for short-term insulin treatment immediately after her discharge. She states that she can afford $25 of Novolin insulin. -Will hold metformin while inpatient -SSI sensitive with CBG x4 ac and hs  -Lantus 15 unit daily and Novolog 3 unit ac for meal coverage   HTN. Resolved. She is not on anti-hypertensive at home. Her BP on presentation is well controlled with orthostatic hypotension persistent to today as she is dizzy from sitting to standing.  -Continue to monitor   Dispo: Anticipated discharge is today.   The patient does not have a current PCP, therefore will be requiring OPC follow-up after discharge. She states that she has had financial hardship, having lost her job and having to move from [facility] to stay with her daughter here in [place]. She plans to return to [facility] eventually but would like to follow up with the [facility] clinic while she is here.  The patient does not have transportation limitations that hinder transportation to clinic appointments.  .Services Needed at time of discharge: Y = Yes, Blank = No  PT:    OT:    RN:    Equipment:    Other:       LOS: 3 days   Sara Chu D 08/05/2012, 10:31 AM

## 2012-08-05 NOTE — Discharge Summary (Signed)
Internal Medicine Teaching [facility] Discharge Note  Name: [name] [name] MRN: [id number] DOB: 1961-03-18 52 y.o.  Date of Admission: 08/02/2012  8:26 AM Date of Discharge: 08/05/2012 Attending Physician: Jonah Blue, DO  Discharge Diagnosis: Principal Problem:  *CAP (community acquired pneumonia) Active Problems:  Diabetes mellitus, type 2   Discharge Medications:   Medication List     As of 08/05/2012  1:22 PM    TAKE these medications         azithromycin 500 MG tablet   Commonly known as: ZITHROMAX   Take 1 tablet (500 mg total) by mouth daily.      ibuprofen 200 MG tablet   Commonly known as: ADVIL,MOTRIN   Take 800 mg by mouth every 6 (six) hours as needed. For pain      insulin NPH 100 UNIT/ML injection   Commonly known as: HUMULIN N,NOVOLIN N   Inject 10 Units into the skin daily before breakfast.      metFORMIN 1000 MG tablet   Commonly known as: GLUCOPHAGE   Take 1,000 mg by mouth 2 (two) times daily with a meal.      MUCINEX PO   Take 2 tablets by mouth 4 (four) times daily as needed. For congestion        Disposition and follow-up:   [name] [name] was discharged from [facility] in stable condition.  At the hospital follow up visit please address: -Her diabetes management including changes to her diet -Improvement of her cough -She will need repeat CXR in 4 weeks -She may need to speak to Asencion Gowda was given a list of documents to bring to [employer]  Follow-up Appointments:     Follow-up Information    Follow up with Denton Ar, MD. (Wednesday, January 29th at 2:15PM)    Contact information:   [address] [place] [address] [phone]       Follow up with Lorelee New, Diabetes Educator. (Wednesday, January 29th at 3:30 PM)    Contact information:   [address] [place] [address] [phone]        Discharge Orders    Future Appointments: Provider:  Department: Dept Phone: Center:   08/17/2012 2:15 PM Larey Seat, MD [facility] [phone] [facility]   08/17/2012 3:30 PM Baird Cancer, RD [facility] [phone] [facility]     Future Orders Please Complete By Expires   Diet - low sodium heart healthy      Increase activity slowly         Consultations:    Procedures Performed:  Dg Chest 2 View  08/02/2012  *RADIOLOGY REPORT*  Clinical Data: Shortness of breath, cough, congestion.  CHEST - 2 VIEW  Comparison: 12/07/2011  Findings: Right infrahilar atelectasis or infiltrate.  Left lung is clear.  Heart is normal size.  No effusions.  No acute bony abnormality.  IMPRESSION: Right infrahilar atelectasis or infiltrate.   Original Report Authenticated By: Charlett Nose, M.D.     Admission HPI:  [name] [name] is a 52 year old woman with PMH of DM2, HTN, who presents to the [facility] ED with 11 days of nonproductive cough. In addition, she has had malaise, occasional shortness of breath while coughing, decreased appetite, and subjective fever that subsided 3 days prior to her presentation. She states that most of her family members have been sick with the flu. She had not sought medical care until now  and denies taking Tamiflu. She took Mucinex for her cough but she was still unable to cough anything up.  She denies chest pain, abdominal pain, diarrhea, nausea, vomiting, or dysuria.   Hospital Course by problem list: CAP. Her CXR on admission showed right infrahilar infiltrate but with no leucocytosis and not fever. She was started on Rocephin IV and azithromycin. Her urine Strep pneumoniae and Legionella antigen were negative. Blood culture x2 NGTD. Sputum culture could not be collected as she did not have productive coguh. She was given hycodan for cough with good results. Initially she was given Duo Nebs to improve her wheezing but she became tachycardic and this therapy was discontinued. She was febrile once on  1/16 with Tmax of 101.17F but remained afebrile since. She had her leucocytosis on 1/15 at 11/9 but it has trended down to 9 on the day of her discharge. She feels better today with improved intake per mouth and no dizziness. She will be discharged with prescription for Azithromycin for 5 more days.   DM2, Uncontrolled. Hgb A1C of 10. She is on metformin 1000 BID at home which was held during this admission. She was placed on Lantus 15 units daily and Novolog 3 units TID ac and SSI with CBGs in 100-200s.  She does not have a PCP and will follow up at the [facility]. She met with Lorelee New who gave her syringes and needles. The patient is agreeable to starting Novolin for short-term insulin treatment immediately after her discharge. She states that she can afford $25 of Novolin insulin. She will be discharged with prescription for Novolin 10 units in the morning and metformin 1000 BID. She has a follow up appointment with Lorelee New. She received the influenza vaccine during this hospitalization.   HTN. Resolved. She is not on anti-hypertensive at home. Her BP on presentation is well controlled with orthostatic hypotension persistent to today as she is dizzy from sitting to standing. She will need outpatient reassessment once her illness is resolved.    Discharge Vitals:  BP 109/74  Pulse 90  Temp 98.9 F (37.2 C) (Oral)  Resp 19  Ht 5\' 6"  (1.676 m)  Wt 176 lb 6.4 oz (80.015 kg)  BMI 28.47 kg/m2  SpO2 94%  Discharge Labs:  Results for orders placed during the hospital encounter of 08/02/12 (from the past 24 hour(s))  CBC     Status: Abnormal   Collection Time   08/04/12  2:24 PM      Component Value Range   WBC 11.4 (*) 4.0 - 10.5 K/uL   RBC 3.91  3.87 - 5.11 MIL/uL   Hemoglobin 12.3  12.0 - 15.0 g/dL   HCT 96.0 (*) 45.4 - 09.8 %   MCV 90.5  78.0 - 100.0 fL   MCH 31.5  26.0 - 34.0 pg   MCHC 34.7  30.0 - 36.0 g/dL   RDW 11.9  14.7 - 82.9 %   Platelets 224  150 - 400 K/uL  BASIC METABOLIC PANEL      Status: Abnormal   Collection Time   08/04/12  2:24 PM      Component Value Range   Sodium 136  135 - 145 mEq/L   Potassium 3.8  3.5 - 5.1 mEq/L   Chloride 103  96 - 112 mEq/L   CO2 21  19 - 32 mEq/L   Glucose, Bld 151 (*) 70 - 99 mg/dL   BUN 8  6 - 23 mg/dL   Creatinine, Ser  0.57  0.50 - 1.10 mg/dL   Calcium 8.7  8.4 - 45.4 mg/dL   GFR calc non Af Amer >90  >90 mL/min   GFR calc Af Amer >90  >90 mL/min  GLUCOSE, CAPILLARY     Status: Normal   Collection Time   08/04/12  4:45 PM      Component Value Range   Glucose-Capillary 88  70 - 99 mg/dL  GLUCOSE, CAPILLARY     Status: Abnormal   Collection Time   08/04/12  9:23 PM      Component Value Range   Glucose-Capillary 251 (*) 70 - 99 mg/dL   Comment 1 Notify RN    GLUCOSE, CAPILLARY     Status: Abnormal   Collection Time   08/05/12  7:46 AM      Component Value Range   Glucose-Capillary 161 (*) 70 - 99 mg/dL   Comment 1 Notify RN    CBC     Status: Normal   Collection Time   08/05/12  8:51 AM      Component Value Range   WBC 9.0  4.0 - 10.5 K/uL   RBC 4.01  3.87 - 5.11 MIL/uL   Hemoglobin 12.4  12.0 - 15.0 g/dL   HCT 09.8  11.9 - 14.7 %   MCV 90.8  78.0 - 100.0 fL   MCH 30.9  26.0 - 34.0 pg   MCHC 34.1  30.0 - 36.0 g/dL   RDW 82.9  56.2 - 13.0 %   Platelets 242  150 - 400 K/uL  BASIC METABOLIC PANEL     Status: Abnormal   Collection Time   08/05/12  8:51 AM      Component Value Range   Sodium 135  135 - 145 mEq/L   Potassium 4.0  3.5 - 5.1 mEq/L   Chloride 101  96 - 112 mEq/L   CO2 22  19 - 32 mEq/L   Glucose, Bld 333 (*) 70 - 99 mg/dL   BUN 8  6 - 23 mg/dL   Creatinine, Ser 8.65  0.50 - 1.10 mg/dL   Calcium 8.8  8.4 - 78.4 mg/dL   GFR calc non Af Amer >90  >90 mL/min   GFR calc Af Amer >90  >90 mL/min  GLUCOSE, CAPILLARY     Status: Abnormal   Collection Time   08/05/12 11:43 AM      Component Value Range   Glucose-Capillary 176 (*) 70 - 99 mg/dL   Comment 1 Notify RN      Signed: Ky Barban 08/05/2012, 1:22 PM   Time Spent on Discharge: 25 minutes Services Ordered on Discharge: None Equipment Ordered on Discharge: None

## 2012-08-05 NOTE — Discharge Summary (Signed)
INTERNAL MEDICINE TEACHING SERVICE Attending Note  Date: 08/05/2012  Patient name: [name] [name]  Medical record number: [id number]  Date of birth: May 17, 1961    This patient has been seen and discussed with the house staff. Please see their note for complete details. I concur with their findings with the following additions/corrections: Afebrile last night. No dizziness reported today. No orthostatic blood pressure changes noted. Overall she feels significantly improved and is eager to go home. No evidence of hypoxia. Cough is improved.  52 year old white female with past medical history significant for diabetes mellitus type 2, hypertension, presented with cough and malaise, with evidence of community acquired pneumonia. #1 community-acquired pneumonia: At this time she has improved significantly. I do not suspect influenza at this time. I would continue azithromycin as an outpatient to complete 5 total days of therapy. #2 diabetes mellitus type 2: This is uncontrolled. At this time she wishes to try insulin and oral agents. She has been seen by our diabetes educator. At this time I agree with NPH insulin 10 units [place] in the morning or night. She can continue to take metformin 1000 mg by mouth twice a day. She needs followup in clinic as she has to have a repeat chest x-ray in 4-6 weeks to assure resolution of pneumonia. I would have her followup clinic downstairs. Currently stable to be discharged home.  Jonah Blue, DO  08/05/2012, 2:52 PM

## 2012-08-09 ENCOUNTER — Telehealth: Payer: Self-pay | Admitting: Dietician

## 2012-08-09 LAB — CULTURE, BLOOD (ROUTINE X 2)
Culture: NO GROWTH
Culture: NO GROWTH

## 2012-08-09 NOTE — Telephone Encounter (Signed)
No answer and unable to leave a voicemail.  

## 2012-08-09 NOTE — Telephone Encounter (Signed)
Discharge date:08-05-12 Call date: 08-09-12 Hospital follow up appointment date: did not know, reminded patient 08-17-12 at 2:15 and 3:30 PM.   Calling to assist with transition of care from hospital to home.  Discharge medications reviewed: just diabetes medicines- she is taking glucophage, but not insulin . Did not go to [facility] and pharmacy she went to told her insulin was 95$. Blood sugar this am was 170 fasting. Patient says she can "probalby" go to [facility] today to get insulin.   Able to fill all prescriptions? No- see above  Patient aware of hospital follow up appointments. - reviewed  No problems with transportation.- will ask pt later  Other problems/concerns:couldn't afford insulin, feeling better, asked CDE to call her later at better time for her.

## 2012-08-17 ENCOUNTER — Ambulatory Visit: Payer: Self-pay | Admitting: Dietician

## 2012-08-17 ENCOUNTER — Ambulatory Visit: Payer: Self-pay | Admitting: Internal Medicine

## 2012-12-13 ENCOUNTER — Encounter (HOSPITAL_COMMUNITY): Payer: Self-pay | Admitting: Adult Health

## 2012-12-13 ENCOUNTER — Emergency Department (HOSPITAL_COMMUNITY)
Admission: EM | Admit: 2012-12-13 | Discharge: 2012-12-13 | Disposition: A | Discharge status: Home / Self Care | Payer: Self-pay | Attending: Emergency Medicine | Admitting: Emergency Medicine

## 2012-12-13 DIAGNOSIS — Z8659 Personal history of other mental and behavioral disorders: Secondary | ICD-10-CM | POA: Insufficient documentation

## 2012-12-13 DIAGNOSIS — W57XXXA Bitten or stung by nonvenomous insect and other nonvenomous arthropods, initial encounter: Secondary | ICD-10-CM | POA: Insufficient documentation

## 2012-12-13 DIAGNOSIS — R7402 Elevation of levels of lactic acid dehydrogenase (LDH): Secondary | ICD-10-CM | POA: Insufficient documentation

## 2012-12-13 DIAGNOSIS — Z792 Long term (current) use of antibiotics: Secondary | ICD-10-CM | POA: Insufficient documentation

## 2012-12-13 DIAGNOSIS — Y929 Unspecified place or not applicable: Secondary | ICD-10-CM | POA: Insufficient documentation

## 2012-12-13 DIAGNOSIS — Z79899 Other long term (current) drug therapy: Secondary | ICD-10-CM | POA: Insufficient documentation

## 2012-12-13 DIAGNOSIS — Z794 Long term (current) use of insulin: Secondary | ICD-10-CM | POA: Insufficient documentation

## 2012-12-13 DIAGNOSIS — R7401 Elevation of levels of liver transaminase levels: Secondary | ICD-10-CM | POA: Insufficient documentation

## 2012-12-13 DIAGNOSIS — E119 Type 2 diabetes mellitus without complications: Secondary | ICD-10-CM | POA: Insufficient documentation

## 2012-12-13 DIAGNOSIS — Z8679 Personal history of other diseases of the circulatory system: Secondary | ICD-10-CM | POA: Insufficient documentation

## 2012-12-13 DIAGNOSIS — Z8719 Personal history of other diseases of the digestive system: Secondary | ICD-10-CM | POA: Insufficient documentation

## 2012-12-13 DIAGNOSIS — S30861A Insect bite (nonvenomous) of abdominal wall, initial encounter: Secondary | ICD-10-CM

## 2012-12-13 DIAGNOSIS — R110 Nausea: Secondary | ICD-10-CM | POA: Insufficient documentation

## 2012-12-13 DIAGNOSIS — Z8701 Personal history of pneumonia (recurrent): Secondary | ICD-10-CM | POA: Insufficient documentation

## 2012-12-13 DIAGNOSIS — I10 Essential (primary) hypertension: Secondary | ICD-10-CM | POA: Insufficient documentation

## 2012-12-13 DIAGNOSIS — Y939 Activity, unspecified: Secondary | ICD-10-CM | POA: Insufficient documentation

## 2012-12-13 DIAGNOSIS — Z8669 Personal history of other diseases of the nervous system and sense organs: Secondary | ICD-10-CM | POA: Insufficient documentation

## 2012-12-13 DIAGNOSIS — S30860A Insect bite (nonvenomous) of lower back and pelvis, initial encounter: Secondary | ICD-10-CM | POA: Insufficient documentation

## 2012-12-13 DIAGNOSIS — Z87891 Personal history of nicotine dependence: Secondary | ICD-10-CM | POA: Insufficient documentation

## 2012-12-13 DIAGNOSIS — Z8739 Personal history of other diseases of the musculoskeletal system and connective tissue: Secondary | ICD-10-CM | POA: Insufficient documentation

## 2012-12-13 LAB — COMPREHENSIVE METABOLIC PANEL
ALT: 127 U/L — ABNORMAL HIGH (ref 0–35)
AST: 78 U/L — ABNORMAL HIGH (ref 0–37)
Albumin: 3.6 g/dL (ref 3.5–5.2)
Alkaline Phosphatase: 90 U/L (ref 39–117)
BUN: 14 mg/dL (ref 6–23)
CO2: 23 mEq/L (ref 19–32)
Calcium: 9.5 mg/dL (ref 8.4–10.5)
Chloride: 104 mEq/L (ref 96–112)
Creatinine, Ser: 0.82 mg/dL (ref 0.50–1.10)
GFR calc Af Amer: >90 mL/min (ref >90)
GFR calc non Af Amer: 81 mL/min — ABNORMAL LOW (ref >90)
Glucose, Bld: 248 mg/dL — ABNORMAL HIGH (ref 70–99)
Potassium: 4.4 mEq/L (ref 3.5–5.1)
Sodium: 139 mEq/L (ref 135–145)
Total Bilirubin: 0.5 mg/dL (ref 0.3–1.2)
Total Protein: 7.4 g/dL (ref 6.0–8.3)

## 2012-12-13 LAB — CBC WITH DIFFERENTIAL/PLATELET
Basophils Absolute: 0 10*3/uL (ref 0.0–0.1)
Basophils Relative: 0 % (ref 0–1)
Eosinophils Absolute: 0.2 10*3/uL (ref 0.0–0.7)
Eosinophils Relative: 2 % (ref 0–5)
HCT: 39.1 % (ref 36.0–46.0)
Hemoglobin: 13.7 g/dL (ref 12.0–15.0)
Lymphocytes Relative: 40 % (ref 12–46)
Lymphs Abs: 2.8 10*3/uL (ref 0.7–4.0)
MCH: 31.7 pg (ref 26.0–34.0)
MCHC: 35 g/dL (ref 30.0–36.0)
MCV: 90.5 fL (ref 78.0–100.0)
Monocytes Absolute: 0.5 10*3/uL (ref 0.1–1.0)
Monocytes Relative: 7 % (ref 3–12)
Neutro Abs: 3.6 10*3/uL (ref 1.7–7.7)
Neutrophils Relative %: 51 % (ref 43–77)
Platelets: 243 10*3/uL (ref 150–400)
RBC: 4.32 MIL/uL (ref 3.87–5.11)
RDW: 12.7 % (ref 11.5–15.5)
WBC: 7.1 10*3/uL (ref 4.0–10.5)

## 2012-12-13 LAB — LIPASE, BLOOD: Lipase: 33 U/L (ref 11–59)

## 2012-12-13 MED ORDER — ONDANSETRON 4 MG PO TBDP
8.0000 mg | ORAL_TABLET | Freq: Once | ORAL | Status: DC
  Filled 2012-12-13: qty 2

## 2012-12-13 NOTE — ED Notes (Signed)
Presents with nausea and dizziness that began this am denies SOB and CP, reports diaphoresis that began at noon today. Pt has a red small itchy are to her right posterior ankle. CMS intact. Pt states, "I am not sure if the two are associated or not" denies abdominal pain.

## 2012-12-13 NOTE — ED Provider Notes (Signed)
History     CSN: [id number]  Arrival date & time 12/13/12  1622   First MD Initiated Contact with Patient 12/13/12 1901      Chief Complaint  Patient presents with  . Nausea    (Consider location/radiation/quality/duration/timing/severity/associated sxs/prior treatment) HPI Comments: 52 y.o. female who has pmh of diabetes presents to the Er w/ the cc of evaluation for rash. She states she was bite by "something", and that the rash has spread slightly and she was worried she might have been bit by something poisonous. She has been itching the area of rash extensively the last few days but has improved after she has used over the counter steroid cream. She had some nausea this morning, and she felt sweaty, but no vomiting, no abdominal pain, no chest pain, no chest pressure. No exertional episodes. No fevers or chills.   Patient is a 52 y.o. female presenting with general illness. The history is provided by the patient.  Illness Location:  Right ankle  Severity:  Mild Onset quality:  Gradual Timing:  Constant Progression:  Unchanged Chronicity:  New Associated symptoms: nausea and rash   Associated symptoms: no abdominal pain, no chest pain, no cough, no diarrhea, no fatigue, no fever, no headaches, no vomiting and no wheezing     Past Medical History  Diagnosis Date  . Diabetes mellitus, type 2     No insulin  . Chest pain   . Pancreatic cyst     Endoscopic aspiration in 09/2009  . Chronic low back pain     HNP  . Headache(784.0)   . Depression   . Hypertension   . Pneumonia 08/02/2012    Past Surgical History  Procedure Laterality Date  . Abdominal hysterectomy    . Cesarean section      History reviewed. No pertinent family history.  History  Substance Use Topics  . Smoking status: Former Smoker    Quit date: 08/03/1978  . Smokeless tobacco: Never Used  . Alcohol Use: Yes     Comment: occasional    OB History   Grav Para Term Preterm Abortions TAB SAB Ect  Mult Living                  Review of Systems  Constitutional: Negative for fever, chills and fatigue.  HENT: Negative for facial swelling, drooling, neck pain and dental problem.   Eyes: Negative for pain, discharge and itching.  Respiratory: Negative for cough, choking, wheezing and stridor.   Cardiovascular: Negative for chest pain.  Gastrointestinal: Positive for nausea. Negative for vomiting, abdominal pain and diarrhea.  Endocrine: Negative for cold intolerance and heat intolerance.  Genitourinary: Negative for vaginal discharge, difficulty urinating and vaginal pain.  Skin: Positive for rash. Negative for pallor.  Neurological: Negative for dizziness, light-headedness and headaches.  Psychiatric/Behavioral: Negative for behavioral problems and agitation.    Allergies  Tramadol  Home Medications   Current Outpatient Rx  Name  Route  Sig  Dispense  Refill  . azithromycin (ZITHROMAX) 500 MG tablet   Oral   Take 1 tablet (500 mg total) by mouth daily.   5 tablet   0   . GuaiFENesin (MUCINEX PO)   Oral   Take 2 tablets by mouth 4 (four) times daily as needed. For congestion         . ibuprofen (ADVIL,MOTRIN) 200 MG tablet   Oral   Take 800 mg by mouth every 6 (six) hours as needed. For pain         .  insulin NPH (NOVOLIN N) 100 UNIT/ML injection   Subcutaneous   Inject 10 Units into the skin daily before breakfast.   1 vial   12   . metFORMIN (GLUCOPHAGE) 1000 MG tablet   Oral   Take 1,000 mg by mouth 2 (two) times daily with a meal.           BP 133/69  Pulse 66  Temp(Src) 98.3 F (36.8 C) (Oral)  Resp 15  SpO2 96%  Physical Exam  Constitutional: She is oriented to person, place, and time. She appears well-developed. No distress.  HENT:  Head: Normocephalic and atraumatic.  Eyes: Pupils are equal, round, and reactive to light. Right eye exhibits no discharge. Left eye exhibits no discharge.  Neck: Neck supple. No tracheal deviation present.   Cardiovascular: Normal rate.  Exam reveals no gallop and no friction rub.   Pulmonary/Chest: No stridor. No respiratory distress. She has no wheezes.  Abdominal: Soft. She exhibits no distension. There is no tenderness. There is no rebound.  Musculoskeletal: She exhibits no edema and no tenderness.  Neurological: She is alert and oriented to person, place, and time.  Skin: She is not diaphoretic.  She has circular rash on her right ankle, no soft tissue swelling, no erythema, no increased warmth. No area of fluctuance.     ED Course  Procedures (including critical care time)  Labs Reviewed  COMPREHENSIVE METABOLIC PANEL - Abnormal; Notable for the following:    Glucose, Bld 248 (*)    AST 78 (*)    ALT 127 (*)    GFR calc non Af Amer 81 (*)    All other components within normal limits  LIPASE, BLOOD  CBC WITH DIFFERENTIAL   No results found.   Date: 12/13/2012  Rate: 75  Rhythm: normal sinus rhythm  QRS Axis: normal  Intervals: normal  ST/T Wave abnormalities: normal  Conduction Disutrbances:none  Narrative Interpretation:   Old EKG Reviewed: unchanged  MDM  Pt has what appears to be local inflammatory reaction to insect bite. She is already using steroid cream. She has no signs of infection on exam. In regards to nausea -- pt had only one episode, she has been tolerating PO at home without any issues after the episode of nausea this morning. She has no chest pain / sob -- doubt cardiac etiology to her issues of nausea, and normal ECG.   Pt found to have slightly elevated transaminitis -- she states in the past she used to use tylenol "a lot", but doesn't use tylenol anymore. She has no RUQ ttp, and she has no abd pain or pain when eating. In absence of any abdominal pain or RUQ pain, no further workup required at this time in the Er. Have told pt to f/u with pcp as further outpatient workup of her slightly elevated LFTs, which pt states she will do within one week.    1.  Insect bite of abdomen with local reaction, initial encounter   2. Transaminitis            Bernadene Person, MD 12/13/12 2243

## 2012-12-13 NOTE — ED Notes (Signed)
Pt states no pain above site of bite

## 2012-12-13 NOTE — ED Provider Notes (Signed)
I saw and evaluated the patient, reviewed the resident's note and I agree with the findings and plan.  Pt with insect or spider bite to R ankle does not appear to be infected, mild surrounding erythema. lso nauseated earlier today but feeling better now. Abd benign. No concern for abdominal pain.   Candiace West B. Bernette Mayers, MD 12/13/12 2340

## 2012-12-13 NOTE — ED Notes (Signed)
Pt c/o small red patch on right lower leg.  St's she has been a steroid cream on it.

## 2013-01-26 ENCOUNTER — Other Ambulatory Visit: Payer: Self-pay

## 2013-06-13 ENCOUNTER — Emergency Department (HOSPITAL_COMMUNITY)
Admission: EM | Admit: 2013-06-13 | Discharge: 2013-06-13 | Disposition: A | Discharge status: Home / Self Care | Payer: BC Managed Care – PPO | Attending: Emergency Medicine | Admitting: Emergency Medicine

## 2013-06-13 ENCOUNTER — Emergency Department (HOSPITAL_COMMUNITY): Payer: BC Managed Care – PPO

## 2013-06-13 ENCOUNTER — Encounter (HOSPITAL_COMMUNITY): Payer: Self-pay | Admitting: Emergency Medicine

## 2013-06-13 DIAGNOSIS — F3289 Other specified depressive episodes: Secondary | ICD-10-CM | POA: Insufficient documentation

## 2013-06-13 DIAGNOSIS — Z87891 Personal history of nicotine dependence: Secondary | ICD-10-CM | POA: Insufficient documentation

## 2013-06-13 DIAGNOSIS — E119 Type 2 diabetes mellitus without complications: Secondary | ICD-10-CM | POA: Insufficient documentation

## 2013-06-13 DIAGNOSIS — M4716 Other spondylosis with myelopathy, lumbar region: Secondary | ICD-10-CM | POA: Insufficient documentation

## 2013-06-13 DIAGNOSIS — M47816 Spondylosis without myelopathy or radiculopathy, lumbar region: Secondary | ICD-10-CM

## 2013-06-13 DIAGNOSIS — I10 Essential (primary) hypertension: Secondary | ICD-10-CM | POA: Insufficient documentation

## 2013-06-13 DIAGNOSIS — F329 Major depressive disorder, single episode, unspecified: Secondary | ICD-10-CM | POA: Insufficient documentation

## 2013-06-13 HISTORY — DX: Reserved for concepts with insufficient information to code with codable children: IMO0002

## 2013-06-13 LAB — BASIC METABOLIC PANEL
BUN: 13 mg/dL (ref 6–23)
CO2: 23 mEq/L (ref 19–32)
Calcium: 8.8 mg/dL (ref 8.4–10.5)
Chloride: 103 mEq/L (ref 96–112)
Creatinine, Ser: 0.59 mg/dL (ref 0.50–1.10)
GFR calc Af Amer: >90 mL/min (ref >90)
GFR calc non Af Amer: >90 mL/min (ref >90)
Glucose, Bld: 212 mg/dL — ABNORMAL HIGH (ref 70–99)
Potassium: 4.3 mEq/L (ref 3.5–5.1)
Sodium: 135 mEq/L (ref 135–145)

## 2013-06-13 LAB — CBC
HCT: 38.4 % (ref 36.0–46.0)
Hemoglobin: 13.7 g/dL (ref 12.0–15.0)
MCH: 32 pg (ref 26.0–34.0)
MCHC: 35.7 g/dL (ref 30.0–36.0)
MCV: 89.7 fL (ref 78.0–100.0)
Platelets: 199 10*3/uL (ref 150–400)
RBC: 4.28 MIL/uL (ref 3.87–5.11)
RDW: 12.7 % (ref 11.5–15.5)
WBC: 4.7 10*3/uL (ref 4.0–10.5)

## 2013-06-13 MED ORDER — IBUPROFEN 600 MG PO TABS: 600.0000 mg | ORAL_TABLET | Freq: Three times a day (TID) | ORAL | Status: DC | PRN

## 2013-06-13 MED ORDER — HYDROMORPHONE HCL PF 1 MG/ML IJ SOLN
1.0000 mg | Freq: Once | INTRAMUSCULAR | Status: AC
  Administered 2013-06-13: 1 mg via INTRAVENOUS
  Filled 2013-06-13: qty 1

## 2013-06-13 MED ORDER — KETOROLAC TROMETHAMINE 30 MG/ML IJ SOLN
30.0000 mg | Freq: Once | INTRAMUSCULAR | Status: AC
  Administered 2013-06-13: 30 mg via INTRAVENOUS
  Filled 2013-06-13: qty 1

## 2013-06-13 MED ORDER — ONDANSETRON HCL 4 MG/2ML IJ SOLN
4.0000 mg | Freq: Once | INTRAMUSCULAR | Status: AC
  Administered 2013-06-13: 4 mg via INTRAVENOUS
  Filled 2013-06-13: qty 2

## 2013-06-13 MED ORDER — OXYCODONE-ACETAMINOPHEN 5-325 MG PO TABS: 1.0000 | ORAL_TABLET | ORAL | Status: DC | PRN

## 2013-06-13 MED ORDER — CYCLOBENZAPRINE HCL 10 MG PO TABS: 10.0000 mg | ORAL_TABLET | Freq: Three times a day (TID) | ORAL | Status: DC | PRN

## 2013-06-13 MED ORDER — DIAZEPAM 5 MG PO TABS
5.0000 mg | ORAL_TABLET | Freq: Once | ORAL | Status: AC
  Administered 2013-06-13: 5 mg via ORAL
  Filled 2013-06-13: qty 1

## 2013-06-13 NOTE — ED Notes (Signed)
Per EMS pt comes form home c/o lower back pain that radiates down right leg ans stops at right knee. Pt has 20g left AC. BP 127/74, HR 82.

## 2013-06-13 NOTE — ED Notes (Signed)
Pt transported to MRI 

## 2013-06-13 NOTE — ED Provider Notes (Signed)
CSN: [id number]     Arrival date & time 06/13/13  6578 History   First MD Initiated Contact with Patient 06/13/13 [phone]     Chief Complaint  Patient presents with  . Back Pain    The history is provided by the patient.   patient reports worsening low back pain over the past 3 days.  No history cancer IV drug abuse.  She denies fevers and chills.  She reports she has a history of low back pain but usually her symptoms are more left-sided than she has new right-sided symptoms.  The pain radiates down to the level of the right knee.  She states she stood up today and felt weak in her right leg and fell to the ground.  No head injury.  No loss consciousness.  She reports ongoing pain and weakness in her right leg.  No bowel or bladder complaints.  No saddle anesthesia described.  She states she has a history of 2 herniated this diagnosed in 2000 for which she never had these operated on.  Past Medical History  Diagnosis Date  . Diabetes mellitus, type 2     No insulin  . Chest pain   . Pancreatic cyst     Endoscopic aspiration in 09/2009  . Chronic low back pain     HNP  . Headache(784.0)   . Depression   . Hypertension   . Pneumonia 08/02/2012  . Herniated disc    Past Surgical History  Procedure Laterality Date  . Abdominal hysterectomy    . Cesarean section     No family history on file. History  Substance Use Topics  . Smoking status: Former Smoker    Quit date: 08/03/1978  . Smokeless tobacco: Never Used  . Alcohol Use: Yes     Comment: occasional   OB History   Grav Para Term Preterm Abortions TAB SAB Ect Mult Living                 Review of Systems  Musculoskeletal: Positive for back pain.  All other systems reviewed and are negative.    Allergies  Tramadol  Home Medications   Current Outpatient Rx  Name  Route  Sig  Dispense  Refill  . ibuprofen (ADVIL,MOTRIN) 200 MG tablet   Oral   Take 800 mg by mouth every 6 (six) hours as needed. For pain          . cyclobenzaprine (FLEXERIL) 10 MG tablet   Oral   Take 1 tablet (10 mg total) by mouth 3 (three) times daily as needed for muscle spasms.   20 tablet   0   . ibuprofen (ADVIL,MOTRIN) 600 MG tablet   Oral   Take 1 tablet (600 mg total) by mouth every 8 (eight) hours as needed.   15 tablet   0   . oxyCODONE-acetaminophen (PERCOCET/ROXICET) 5-325 MG per tablet   Oral   Take 1 tablet by mouth every 4 (four) hours as needed for severe pain.   20 tablet   0    BP 113/65  Pulse 67  Resp 16  SpO2 98% Physical Exam  Nursing note and vitals reviewed. Constitutional: She is oriented to person, place, and time. She appears well-developed and well-nourished. No distress.  HENT:  Head: Normocephalic and atraumatic.  Eyes: EOM are normal.  Neck: Normal range of motion.  Cardiovascular: Normal rate, regular rhythm and normal heart sounds.   Pulmonary/Chest: Effort normal and breath sounds normal.  Abdominal: Soft.  She exhibits no distension. There is no tenderness.  Musculoskeletal: Normal range of motion.  Lumbar and paralumbar tenderness without focal pain.  No lumbar spasm noted.  Mild weakness with flexion and extension at the right knee.  Normal reflexes.  Neurological: She is alert and oriented to person, place, and time.  Skin: Skin is warm and dry.  Psychiatric: She has a normal mood and affect. Judgment normal.    ED Course  Procedures (including critical care time) Labs Review Labs Reviewed  BASIC METABOLIC PANEL - Abnormal; Notable for the following:    Glucose, Bld 212 (*)    All other components within normal limits  CBC   Imaging Review Mr Lumbar Spine Wo Contrast  06/13/2013   CLINICAL DATA:  Low back pain. Weakness and numbness in the right leg. Severe worsening low back pain.  EXAM: MRI LUMBAR SPINE WITHOUT CONTRAST  TECHNIQUE: Multiplanar, multisequence MR imaging was performed. No intravenous contrast was administered.  COMPARISON:  08/30/2009 MRI lumbar  spine.  Abdominal MRI 09/03/2009.  FINDINGS: Numbering used on prior exam is preserved. The spinal cord terminates posterior to the L1 vertebra. Paraspinal soft tissues within normal limits. Previously demonstrated pancreatic lesion is not imaged. Mild loss of the normal lumbar lordosis. Progressive degenerative endplate changes at L4-L5 associated with progressive L4-L5 degenerative disc disease. There is new at retrolisthesis of L4 on L5 measuring 3 mm. Unchanged 3 mm grade I retrolisthesis of L3 on L4. Discogenic marrow edema is present on both sides of the disc space.  T12-L1, L1-L2 and T11-T12 discs appear normal.  L2-L3: There is a shallow left posterior lateral protrusion without resulting stenosis. Shallow broad-based disc bulging.  L3-L4: Mild progression of degenerative disc disease. Disc desiccation is present with shallow broad-based posterior disc bulging. There is a small concentric annular tear in the central to left paracentral region. Central canal, lateral recesses and neural foramina are patent.  L4-L5: Mild central stenosis associated with broad-based posterior disc protrusion, facet hypertrophy and ligamentum flavum redundancy. Bilateral lateral recess encroachment is present associated with progressive posterior disc bulging/protrusion. Neural foramina appear adequately patent.  L5-S1: Right-greater-than-left bilateral foraminal protrusions are present, with the right appearing more pronounced than on the prior exam. This contacts the exited right L5 nerve. Left L5 foraminal encroachment associated with disc protrusion appears unchanged compared to prior study from 2011. Central canal and lateral recesses are patent.  IMPRESSION: Mild progression of L4-L5 and L5-S1 spondylosis compared to 2011. Mild bilateral lateral recess encroachment at L4-L5 potentially affecting the descending L5 nerves. Progressive right foraminal protrusion at L5-S1 potentially affects the right L5 nerve.    Electronically Signed   By: Andreas Newport M.D.   On: 06/13/2013 11:25  I personally reviewed the imaging tests through PACS system I reviewed available ER/hospitalization records through the EMR   EKG Interpretation   None       MDM   1. Lumbar spondylosis    MRI demonstrates worsening lumbar spondylosis.  She will need outpatient spine followup.  She seen Dr. Luiz Blare in the past and therefore I've asked that she return there.  If she chooses not to go back and see Dr. Luiz Blare and Dr. Venetia Maxon is on call.  She was given both contact numbers.  Home with pain medication and muscle relaxants.  She understands return to the ER for new or worsening symptoms.  No signs of cauda equina at this time.  Patient feels better after pain medication    Lyanne Co,  MD 06/13/13 1516

## 2013-10-26 ENCOUNTER — Other Ambulatory Visit: Payer: Self-pay

## 2013-11-12 ENCOUNTER — Emergency Department (HOSPITAL_COMMUNITY)
Admission: EM | Admit: 2013-11-12 | Discharge: 2013-11-14 | Disposition: A | Discharge status: Home / Self Care | Payer: Self-pay | Attending: Emergency Medicine | Admitting: Emergency Medicine

## 2013-11-12 ENCOUNTER — Encounter (HOSPITAL_COMMUNITY): Payer: Self-pay | Admitting: Emergency Medicine

## 2013-11-12 DIAGNOSIS — Z8739 Personal history of other diseases of the musculoskeletal system and connective tissue: Secondary | ICD-10-CM | POA: Insufficient documentation

## 2013-11-12 DIAGNOSIS — Z8701 Personal history of pneumonia (recurrent): Secondary | ICD-10-CM | POA: Insufficient documentation

## 2013-11-12 DIAGNOSIS — F3289 Other specified depressive episodes: Secondary | ICD-10-CM | POA: Insufficient documentation

## 2013-11-12 DIAGNOSIS — R45851 Suicidal ideations: Secondary | ICD-10-CM

## 2013-11-12 DIAGNOSIS — Z79899 Other long term (current) drug therapy: Secondary | ICD-10-CM | POA: Insufficient documentation

## 2013-11-12 DIAGNOSIS — B029 Zoster without complications: Secondary | ICD-10-CM

## 2013-11-12 DIAGNOSIS — I10 Essential (primary) hypertension: Secondary | ICD-10-CM | POA: Insufficient documentation

## 2013-11-12 DIAGNOSIS — F329 Major depressive disorder, single episode, unspecified: Secondary | ICD-10-CM | POA: Insufficient documentation

## 2013-11-12 DIAGNOSIS — E119 Type 2 diabetes mellitus without complications: Secondary | ICD-10-CM | POA: Insufficient documentation

## 2013-11-12 DIAGNOSIS — G8929 Other chronic pain: Secondary | ICD-10-CM | POA: Insufficient documentation

## 2013-11-12 DIAGNOSIS — R739 Hyperglycemia, unspecified: Secondary | ICD-10-CM

## 2013-11-12 DIAGNOSIS — Z87891 Personal history of nicotine dependence: Secondary | ICD-10-CM | POA: Insufficient documentation

## 2013-11-12 DIAGNOSIS — F32A Depression, unspecified: Secondary | ICD-10-CM

## 2013-11-12 HISTORY — DX: Zoster without complications: B02.9

## 2013-11-12 LAB — URINE MICROSCOPIC-ADD ON

## 2013-11-12 LAB — CBG MONITORING, ED
Glucose-Capillary: 380 mg/dL — ABNORMAL HIGH (ref 70–99)
Glucose-Capillary: 291 mg/dL — ABNORMAL HIGH (ref 70–99)
Glucose-Capillary: 189 mg/dL — ABNORMAL HIGH (ref 70–99)

## 2013-11-12 LAB — COMPREHENSIVE METABOLIC PANEL
ALT: 70 U/L — ABNORMAL HIGH (ref 0–35)
AST: 44 U/L — ABNORMAL HIGH (ref 0–37)
Albumin: 3.7 g/dL (ref 3.5–5.2)
Alkaline Phosphatase: 138 U/L — ABNORMAL HIGH (ref 39–117)
BUN: 10 mg/dL (ref 6–23)
CO2: 22 mEq/L (ref 19–32)
Calcium: 9.4 mg/dL (ref 8.4–10.5)
Chloride: 97 mEq/L (ref 96–112)
Creatinine, Ser: 0.55 mg/dL (ref 0.50–1.10)
GFR calc Af Amer: >90 mL/min (ref >90)
GFR calc non Af Amer: >90 mL/min (ref >90)
Glucose, Bld: 394 mg/dL — ABNORMAL HIGH (ref 70–99)
Potassium: 4.2 mEq/L (ref 3.7–5.3)
Sodium: 136 mEq/L — ABNORMAL LOW (ref 137–147)
Total Bilirubin: 0.3 mg/dL (ref 0.3–1.2)
Total Protein: 7.2 g/dL (ref 6.0–8.3)

## 2013-11-12 LAB — CBC WITH DIFFERENTIAL/PLATELET
Basophils Absolute: 0 10*3/uL (ref 0.0–0.1)
Basophils Relative: 0 % (ref 0–1)
Eosinophils Absolute: 0.1 10*3/uL (ref 0.0–0.7)
Eosinophils Relative: 1 % (ref 0–5)
HCT: 42.1 % (ref 36.0–46.0)
Hemoglobin: 14.7 g/dL (ref 12.0–15.0)
Lymphocytes Relative: 34 % (ref 12–46)
Lymphs Abs: 2.5 10*3/uL (ref 0.7–4.0)
MCH: 31.8 pg (ref 26.0–34.0)
MCHC: 34.9 g/dL (ref 30.0–36.0)
MCV: 91.1 fL (ref 78.0–100.0)
Monocytes Absolute: 0.3 10*3/uL (ref 0.1–1.0)
Monocytes Relative: 5 % (ref 3–12)
Neutro Abs: 4.4 10*3/uL (ref 1.7–7.7)
Neutrophils Relative %: 60 % (ref 43–77)
Platelets: 206 10*3/uL (ref 150–400)
RBC: 4.62 MIL/uL (ref 3.87–5.11)
RDW: 12 % (ref 11.5–15.5)
WBC: 7.3 10*3/uL (ref 4.0–10.5)

## 2013-11-12 LAB — URINALYSIS, ROUTINE W REFLEX MICROSCOPIC
Bilirubin Urine: NEGATIVE
Glucose, UA: >1000 mg/dL — AB
Hgb urine dipstick: NEGATIVE
Ketones, ur: NEGATIVE mg/dL
Leukocytes, UA: NEGATIVE
Nitrite: NEGATIVE
Protein, ur: NEGATIVE mg/dL
Specific Gravity, Urine: 1.034 — ABNORMAL HIGH (ref 1.005–1.030)
Urobilinogen, UA: 0.2 mg/dL (ref 0.0–1.0)
pH: 7 (ref 5.0–8.0)

## 2013-11-12 MED ORDER — ACYCLOVIR 200 MG PO CAPS
800.0000 mg | ORAL_CAPSULE | Freq: Every day | ORAL | Status: DC
  Administered 2013-11-13: 800 mg via ORAL
  Filled 2013-11-12 (×6): qty 4

## 2013-11-12 MED ORDER — LORAZEPAM 1 MG PO TABS: 1.0000 mg | ORAL_TABLET | Freq: Three times a day (TID) | ORAL | Status: DC | PRN

## 2013-11-12 MED ORDER — SODIUM CHLORIDE 0.9 % IV SOLN
INTRAVENOUS | Status: DC
  Administered 2013-11-12: 19:00:00 via INTRAVENOUS

## 2013-11-12 MED ORDER — ACYCLOVIR 200 MG PO CAPS
400.0000 mg | ORAL_CAPSULE | Freq: Once | ORAL | Status: AC
  Administered 2013-11-12: 400 mg via ORAL
  Filled 2013-11-12: qty 2

## 2013-11-12 MED ORDER — ACYCLOVIR 200 MG PO CAPS: 400.0000 mg | ORAL_CAPSULE | Freq: Every day | ORAL | Status: DC

## 2013-11-12 MED ORDER — SODIUM CHLORIDE 0.9 % IV BOLUS (SEPSIS)
1000.0000 mL | Freq: Once | INTRAVENOUS | Status: AC
  Administered 2013-11-12: 1000 mL via INTRAVENOUS

## 2013-11-12 MED ORDER — ALUM & MAG HYDROXIDE-SIMETH 200-200-20 MG/5ML PO SUSP: 30.0000 mL | ORAL | Status: DC | PRN

## 2013-11-12 MED ORDER — IBUPROFEN 200 MG PO TABS
600.0000 mg | ORAL_TABLET | Freq: Three times a day (TID) | ORAL | Status: DC | PRN
  Administered 2013-11-13 – 2013-11-14 (×2): 600 mg via ORAL
  Filled 2013-11-12 (×4): qty 1

## 2013-11-12 MED ORDER — INSULIN ASPART 100 UNIT/ML ~~LOC~~ SOLN
10.0000 [IU] | Freq: Once | SUBCUTANEOUS | Status: AC
  Administered 2013-11-12: 10 [IU] via SUBCUTANEOUS
  Filled 2013-11-12: qty 1

## 2013-11-12 MED ORDER — ONDANSETRON HCL 4 MG PO TABS
4.0000 mg | ORAL_TABLET | Freq: Three times a day (TID) | ORAL | Status: DC | PRN
Start: 2013-11-12 — End: 2013-11-14

## 2013-11-12 MED ORDER — ACETAMINOPHEN 325 MG PO TABS
650.0000 mg | ORAL_TABLET | ORAL | Status: DC | PRN
  Administered 2013-11-14: 650 mg via ORAL
  Filled 2013-11-12: qty 2

## 2013-11-12 MED ORDER — ZOLPIDEM TARTRATE 5 MG PO TABS: 5.0000 mg | ORAL_TABLET | Freq: Every evening | ORAL | Status: DC | PRN

## 2013-11-12 NOTE — ED Notes (Addendum)
Pt requested that RN come into the room to discuss pt depression. Pt states that she has battled with depression for a numerous amount of years, was on psychiatric medications but stopped taking them because she could not afford them. Pt states that she has some good days but in the past months has been feeling more "down and depressed." Pt states that she is having SI denies any HI, states that she "prays that God will just kill me." "I couldn't go through with a plan because I probably would be successful, if I found a way to be successful I probably would kill myself. " Advised pt of hospital protocol, pt understands, charge RN and [place], [place] notified.

## 2013-11-12 NOTE — ED Notes (Signed)
Report called to Audry Riles RN
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
## 2013-11-12 NOTE — ED Notes (Signed)
Pt presents to department for evaluation of hyperglycemia, states blood sugar elevated x1 week. Also states panic attacks and increase in anxiety. Also states rash to L breast, states shingles in the past. Pt is alert and oriented x4.

## 2013-11-12 NOTE — BH Assessment (Signed)
Received a call for a tele-assessment. Spoke with Domenic Moras, PA-C who stated that pt has a history of depression. Pt is suicidal without a plan. Pt has been depressed for the past several months and reports that she prays night to die. Pt is diabetic. Contact was made with pt's nurse Danielle to set up tele-assessment machine was informed that pt is currently walking back to [facility] and will be ready in approximately 10-15 minutes. Counselor will check back in around that time.

## 2013-11-12 NOTE — BH Assessment (Signed)
Tele-assessment completed. Consulted with Serena Colonel, NP who agrees that patient meets inpatient criteria. TTS will seek placement at other facilities. Notified Domenic Moras, PA-C of recommendations.

## 2013-11-12 NOTE — ED Provider Notes (Signed)
CSN: [id number]     Arrival date & time 11/12/13  1610 History   First MD Initiated Contact with Patient 11/12/13 1632     Chief Complaint  Patient presents with  . Hyperglycemia  . Anxiety     (Consider location/radiation/quality/duration/timing/severity/associated sxs/prior Treatment) HPI  53 year old female with history of non-insulin-dependent diabetes, chronic low back pain, and hypertension who presents for evaluation of feeling anxious, and having elevated blood sugar. Patient reports she has history of depression currently not been treated with any medication. For the past several months her depression has gotten worse. At time she has thought about not living but has no specific plan. No homicidal ideation, denies self medicating with drugs or alcohol. Because she does not have a primary care Dr. she has not been evaluated for this. Patient also mentioned that she is having unexplained weight loss using a total of 20 pounds within the past several months. She also endorsed polyuria, polydipsia, generalized fatigue, and having elevated blood sugar for the past month. States sometimes her insulin meter would not even registered. She denies any medication changes. She denies any recent sickness except one episode of having nausea or vomiting for about a day which is 3 weeks ago when she first noticed elevated blood sugar. She denies any recent sickness with the exception of having a rash to her left upper chest which she described as mildly itchy but bony and stabbing that felt similar to her prior shingle infection which he had in the past. No specific treatment tried. Rash has not gotten better or worse. Otherwise patient did not complaining of fever, chills, headache, chest pain, shortness of breath, abdominal pain, nausea vomiting diarrhea, dysuria.  Past Medical History  Diagnosis Date  . Diabetes mellitus, type 2     No insulin  . Chest pain   . Pancreatic cyst     Endoscopic  aspiration in 09/2009  . Chronic low back pain     HNP  . Headache(784.0)   . Depression   . Hypertension   . Pneumonia 08/02/2012  . Herniated disc    Past Surgical History  Procedure Laterality Date  . Abdominal hysterectomy    . Cesarean section     No family history on file. History  Substance Use Topics  . Smoking status: Former Smoker    Quit date: 08/03/1978  . Smokeless tobacco: Never Used  . Alcohol Use: Yes     Comment: occasional   OB History   Grav Para Term Preterm Abortions TAB SAB Ect Mult Living                 Review of Systems  All other systems reviewed and are negative.     Allergies  Tramadol  Home Medications   Prior to Admission medications   Medication Sig Start Date End Date Taking? Authorizing Provider  diphenhydrAMINE (BENADRYL) 25 mg capsule Take 50 mg by mouth daily as needed for allergies.   Yes Historical Provider, MD  ibuprofen (ADVIL,MOTRIN) 200 MG tablet Take 800 mg by mouth every 6 (six) hours as needed. For pain   Yes Historical Provider, MD  metFORMIN (GLUCOPHAGE) 1000 MG tablet Take 1,000 mg by mouth 2 (two) times daily with a meal.   Yes Historical Provider, MD   BP 142/81  Pulse 80  Temp(Src) 98.1 F (36.7 C) (Oral)  Resp 22  Ht 5' 5.5" (1.664 m)  Wt 163 lb 6 oz (74.106 kg)  BMI 26.76 kg/m2  SpO2 100%  Physical Exam  Nursing note and vitals reviewed. Constitutional: She is oriented to person, place, and time. She appears well-developed and well-nourished. No distress.  HENT:  Head: Atraumatic.  Mouth/Throat: Oropharynx is clear and moist.  Eyes: Conjunctivae are normal.  Neck: Neck supple.  Cardiovascular: Normal rate and regular rhythm.   Pulmonary/Chest: Effort normal.  Abdominal: Soft. There is no tenderness.  Musculoskeletal: She exhibits no edema.  Neurological: She is alert and oriented to person, place, and time.  Patient is alert and oriented. Appears tearful and depressed.  Skin: Rash (vesicular rash in  a dermatomal pattern noted to L upper chest consistent with shingle.  ) noted.  Psychiatric: Her speech is normal. Judgment normal. She is withdrawn. Thought content is not paranoid. Cognition and memory are normal. She exhibits a depressed mood. She expresses suicidal ideation. She expresses no homicidal ideation.    ED Course  Procedures (including critical care time)  5:43 PM Patient is here with multiple complaints including generalized depression currently not being treated, and evidence of hyperglycemia likely to be hyperosmotic nonketotic state without effusion. Patient has a normal anion gap. Initial CBG is greater than 384. IV fluid given, along with 10 units of insulin subcutaneously. Patient has patient with herpetic zoster rash noted to left upper chest. Acyclovir 400 mg by mouth given.  7:34 PM Pt expressed SI without plan, and requesting help. Will consult TTS.  Pt will ultimately needs to have resource to f/u with PCP for further management of her diabetes.  She will need acyclovir treatment for her shingle.  Pt agrees with plan.    9:02 PM TTS agrees to evaluate pt for further psychiatric management.    10:08 PM Pt has been evaluated by TTS and qualify for inpt psychiatric care.  Pt need placement.    Labs Review Labs Reviewed  COMPREHENSIVE METABOLIC PANEL - Abnormal; Notable for the following:    Sodium 136 (*)    Glucose, Bld 394 (*)    AST 44 (*)    ALT 70 (*)    Alkaline Phosphatase 138 (*)    All other components within normal limits  URINALYSIS, ROUTINE W REFLEX MICROSCOPIC - Abnormal; Notable for the following:    Specific Gravity, Urine 1.034 (*)    Glucose, UA >1000 (*)    All other components within normal limits  CBG MONITORING, ED - Abnormal; Notable for the following:    Glucose-Capillary 380 (*)    All other components within normal limits  CBG MONITORING, ED - Abnormal; Notable for the following:    Glucose-Capillary 291 (*)    All other components  within normal limits  CBG MONITORING, ED - Abnormal; Notable for the following:    Glucose-Capillary 189 (*)    All other components within normal limits  CBC WITH DIFFERENTIAL  URINE MICROSCOPIC-ADD ON    Imaging Review No results found.   EKG Interpretation None      MDM   Final diagnoses:  Suicidal ideation  Hyperglycemia  Depression  Shingles    BP 123/76  Pulse 82  Temp(Src) 98.1 F (36.7 C) (Oral)  Resp 20  Ht 5' 5.5" (1.664 m)  Wt 163 lb 6 oz (74.106 kg)  BMI 26.76 kg/m2  SpO2 100%  I have reviewed nursing notes and vital signs. I reviewed available ER/hospitalization records thought the EMR     Domenic Moras, [place] 11/12/13 2208

## 2013-11-12 NOTE — ED Notes (Signed)
TTS in progress 

## 2013-11-12 NOTE — ED Notes (Addendum)
Security paged to wand pt. House coverage called for sitter, no sitter at this time, charge RN notified.

## 2013-11-12 NOTE — ED Notes (Signed)
Notified pharmacy to tube pt medication to tube station

## 2013-11-12 NOTE — ED Provider Notes (Signed)
Medical screening examination/treatment/procedure(s) were performed by non-physician practitioner and as supervising physician I was immediately available for consultation/collaboration.   EKG Interpretation None      Rolland Porter, MD, Abram Sander   Janice Norrie, MD 11/12/13 2224

## 2013-11-13 ENCOUNTER — Encounter (HOSPITAL_COMMUNITY): Payer: Self-pay | Admitting: Emergency Medicine

## 2013-11-13 LAB — CBG MONITORING, ED: Glucose-Capillary: 249 mg/dL — ABNORMAL HIGH (ref 70–99)

## 2013-11-13 MED ORDER — METFORMIN HCL 500 MG PO TABS
1000.0000 mg | ORAL_TABLET | Freq: Two times a day (BID) | ORAL | Status: DC
  Administered 2013-11-13 – 2013-11-14 (×2): 1000 mg via ORAL
  Filled 2013-11-13 (×2): qty 2

## 2013-11-13 MED ORDER — ACYCLOVIR 800 MG PO TABS
800.0000 mg | ORAL_TABLET | Freq: Every day | ORAL | Status: AC
  Administered 2013-11-13 – 2013-11-14 (×5): 800 mg via ORAL
  Filled 2013-11-13 (×4): qty 1

## 2013-11-13 NOTE — BH Assessment (Addendum)
Pt denied by Dr. Alcide Clever at [facility] due to medical acuity (high glucose levels, rash), per Larkin Ina.  Referral faxed to [facility].  Boyce Medici, MA  Disposition MHT

## 2013-11-13 NOTE — BH Assessment (Signed)
11/13/13 Per Tommy only one female Geri bed @ [facility] and referral has been faxed for review this am.  Per Linda-Referral faxed over to [facility] Disposition MHT/NS

## 2013-11-13 NOTE — ED Notes (Signed)
Sitter at bedside.

## 2013-11-14 DIAGNOSIS — R45851 Suicidal ideations: Secondary | ICD-10-CM

## 2013-11-14 DIAGNOSIS — F411 Generalized anxiety disorder: Secondary | ICD-10-CM

## 2013-11-14 DIAGNOSIS — F332 Major depressive disorder, recurrent severe without psychotic features: Secondary | ICD-10-CM

## 2013-11-14 LAB — CBG MONITORING, ED
Glucose-Capillary: 254 mg/dL — ABNORMAL HIGH (ref 70–99)
Glucose-Capillary: 249 mg/dL — ABNORMAL HIGH (ref 70–99)

## 2013-11-14 MED ORDER — ACYCLOVIR 800 MG PO TABS: 800.0000 mg | ORAL_TABLET | Freq: Every day | ORAL | Status: DC

## 2013-11-14 MED ORDER — ACYCLOVIR 800 MG PO TABS
800.0000 mg | ORAL_TABLET | Freq: Every day | ORAL | Status: DC
  Administered 2013-11-14: 800 mg via ORAL
  Filled 2013-11-14 (×6): qty 1

## 2013-11-14 MED ORDER — METFORMIN HCL 1000 MG PO TABS: 1000.0000 mg | ORAL_TABLET | Freq: Two times a day (BID) | ORAL | Status: DC
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
## 2013-11-14 NOTE — Discharge Instructions (Signed)
Depression, Adult Go immediately to [facility] after picking up Rx's.  Depression refers to feeling sad, low, down in the dumps, blue, gloomy, or empty. In general, there are two kinds of depression: 1. Depression that we all experience from time to time because of upsetting life experiences, including the loss of a job or the ending of a relationship (normal sadness or normal grief). This kind of depression is considered normal, is short lived, and resolves within a few days to 2 weeks. (Depression experienced after the loss of a loved one is called bereavement. Bereavement often lasts longer than 2 weeks but normally gets better with time.) 2. Clinical depression, which lasts longer than normal sadness or normal grief or interferes with your ability to function at home, at work, and in school. It also interferes with your personal relationships. It affects almost every aspect of your life. Clinical depression is an illness. Symptoms of depression also can be caused by conditions other than normal sadness and grief or clinical depression. Examples of these conditions are listed as follows:  Physical illness Some physical illnesses, including underactive thyroid gland (hypothyroidism), severe anemia, specific types of cancer, diabetes, uncontrolled seizures, heart and lung problems, strokes, and chronic pain are commonly associated with symptoms of depression.  Side effects of some prescription medicine In some people, certain types of prescription medicine can cause symptoms of depression.  Substance abuse Abuse of alcohol and illicit drugs can cause symptoms of depression. SYMPTOMS Symptoms of normal sadness and normal grief include the following:  Feeling sad or crying for short periods of time.  Not caring about anything (apathy).  Difficulty sleeping or sleeping too much.  No longer able to enjoy the things you used to enjoy.  Desire to be by oneself all the time (social isolation).  Lack  of energy or motivation.  Difficulty concentrating or remembering.  Change in appetite or weight.  Restlessness or agitation. Symptoms of clinical depression include the same symptoms of normal sadness or normal grief and also the following symptoms:  Feeling sad or crying all the time.  Feelings of guilt or worthlessness.  Feelings of hopelessness or helplessness.  Thoughts of suicide or the desire to harm yourself (suicidal ideation).  Loss of touch with reality (psychotic symptoms). Seeing or hearing things that are not real (hallucinations) or having false beliefs about your life or the people around you (delusions and paranoia). DIAGNOSIS  The diagnosis of clinical depression usually is based on the severity and duration of the symptoms. Your caregiver also will ask you questions about your medical history and substance use to find out if physical illness, use of prescription medicine, or substance abuse is causing your depression. Your caregiver also may order blood tests. TREATMENT  Typically, normal sadness and normal grief do not require treatment. However, sometimes antidepressant medicine is prescribed for bereavement to ease the depressive symptoms until they resolve. The treatment for clinical depression depends on the severity of your symptoms but typically includes antidepressant medicine, counseling with a mental health professional, or a combination of both. Your caregiver will help to determine what treatment is best for you. Depression caused by physical illness usually goes away with appropriate medical treatment of the illness. If prescription medicine is causing depression, talk with your caregiver about stopping the medicine, decreasing the dose, or substituting another medicine. Depression caused by abuse of alcohol or illicit drugs abuse goes away with abstinence from these substances. Some adults need professional help in order to stop drinking or  using drugs. SEEK  IMMEDIATE CARE IF:  You have thoughts about hurting yourself or others.  You lose touch with reality (have psychotic symptoms).  You are taking medicine for depression and have a serious side effect. FOR MORE INFORMATION National Alliance on Mental Illness: www.nami.[employer] of Mental [employer]: https://carter.com/ Document Released: 07/03/2000 Document Revised: 01/05/2012 Document Reviewed: 10/05/2011 [facility] Patient Information 2014 [place].

## 2013-11-14 NOTE — ED Notes (Signed)
DISCUSSED PLAN TO DISCHARGE PT HOME. SHE STATES SHE DOESN'T KNOW HOW SHE IS GETTING HOME AND SHE DOESN'T HAVE ANY CLOTHES TO [place]. DORIS TO BEDSIDE TO DISCUSS PT FOLLOW UP WITH [facility].

## 2013-11-14 NOTE — BH Assessment (Signed)
Pt denied at [facility] because acuity is too hight on the unit, per  Manuela Schwartz.  Boyce Medici, MA  Disposition MHT

## 2013-11-14 NOTE — ED Notes (Signed)
SOCIAL WORKER IN TO TALK TO PT ABOUT GETTING A PMD AND RX HELP SO SHE CAN GET BACK ON HER DIABETES MEDS AND IF ANY ANTIDEPRESSANTS ARE PRESCRIBED SHE CAN AFFORD TO GET THEM FILLED.

## 2013-11-14 NOTE — Progress Notes (Signed)
The following facilities have been contacted regarding inptx on pt's behalf:  [facility]- per Pamala Hurry can fax for wait list, referral faxed Crist[facility]- per [place] female bed available, referral faxed [facility]- per Manuela Schwartz can fax, referral faxed Dorothyann Peng- per [facility] beds available referral faxed Autumn Patty- at [facility]- no answer [facility]- per [place] at [employer] [facility] [facility]- no answer [facility]- no female beds per Collie Siad [facility]- can fax referrals after 9 or 10 am Mikel Cella- per [facility] may have beds available later in the am and can fax referral then  Pt declined at [facility] d/t medical acuity Pt must be at least 55+ in age to be a candidate for [facility] Referral already faxed to [facility].  Per [facility] referral has been received but has not been reviewed yet   Rick Duff Disposition MHT

## 2013-11-14 NOTE — ED Notes (Signed)
SPOKE TO ERIC AT [facility], HE IS GOING TO SPEAK TO [facility] ABOUT POSSIBLY BRINGING PT OVER TO OBS UNIT TODAY.

## 2013-11-14 NOTE — Progress Notes (Signed)
Per, Heloise Purpura patient is psychiatrically stable and can be discharged with referrals.  The case manager will provide referrals for the patient for diabetic.  The nurse will provide the patient with mental health referrals for [facility] due to the patient not having insurance.    Writer informed the nurse working with the patient.  The NP, Heloise Purpura has informed the ER MD working with the patient.

## 2013-11-14 NOTE — Progress Notes (Signed)
CSW consult to pt in regards to being discharged and following up with community resources. CSW gave pt information for [facility]. CSW  shared with pt that she could walk into [facility] between the hours of 8 am to 3 pm will be seen and prescribed medications. Pt voiced understanding. Pt has called her son to transport to [place].    [address], [place]

## 2013-11-14 NOTE — Progress Notes (Signed)
  CARE MANAGEMENT ED NOTE 11/14/2013  Patient:  [name] [name], [name] [name]   Account Number:  [id number]  Date Initiated:  11/14/2013  Documentation initiated by:  Edwyna Shell  Subjective/Objective Assessment:   53 yo female presenting to the [facility] ED with depression and has been off her DM medications for 1 year due to financial hardship.     Subjective/Objective Assessment Detail:     Action/Plan:   Action/Plan Detail:   Anticipated DC Date:       Status Recommendation to Physician:   Result of Recommendation:  Agreed    DC Planning Services  CM consult  Follow-up appt scheduled  Other  PCP issues    Choice offered to / List presented to:  C-1 Patient          Status of service:  Completed, signed off  ED Comments:   ED Comments Detail:  CM visited patient regarding ED visit. Patient stated that she does not have a PCP or insurance and is unable to afford her medications and DM testing supplies. Patient stated that she has a part-time job but continues to have a hard time affording physician visits. Discussed local resources and options of care and provided the patient with a written resource list. Patient stated that she is interested in the [facility] since it is close and has a pharmacy on the premises. Called [facility] and obtained an appointment for the patient on May 9th at 11:00 am. Then spoke with the pharmacist Cyril Mourning and discussed diabetic testing supplies. Cyril Mourning stated that the patient would need to bring in prescriptions for the glucometer supplies, for the [facility] specific supplies, and they can provide this to the patient free to sliding scale fee. An update with the [facility] appointment, discharge medications, and diabetes testing supplies, and need for prescriptions for discharge was provided to Dr. Dina Rich. Patient was provided with a [facility] pamphlet with the appointment date and time on it and was encouraged to go to the clinic post discharge to fill prescriptions prior to following up  with [facility]. Patient was appreciative of services provided.

## 2013-11-25 ENCOUNTER — Other Ambulatory Visit (HOSPITAL_COMMUNITY)
Admission: RE | Admit: 2013-11-25 | Discharge: 2013-11-25 | Disposition: A | Discharge status: Home / Self Care | Payer: BC Managed Care – PPO | Source: Ambulatory Visit | Attending: Family Medicine | Admitting: Family Medicine

## 2013-11-25 ENCOUNTER — Other Ambulatory Visit (HOSPITAL_COMMUNITY): Admission: RE | Admit: 2013-11-25 | Payer: BC Managed Care – PPO | Source: Ambulatory Visit | Admitting: Family Medicine

## 2013-11-25 ENCOUNTER — Encounter: Payer: Self-pay | Admitting: Family Medicine

## 2013-11-25 ENCOUNTER — Ambulatory Visit: Payer: BC Managed Care – PPO | Attending: Family Medicine | Admitting: Family Medicine

## 2013-11-25 VITALS — BP 109/72 | HR 81 | Temp 98.7°F | Resp 16 | Ht 66.0 in | Wt 164.0 lb

## 2013-11-25 DIAGNOSIS — R1032 Left lower quadrant pain: Secondary | ICD-10-CM

## 2013-11-25 DIAGNOSIS — F3289 Other specified depressive episodes: Secondary | ICD-10-CM

## 2013-11-25 DIAGNOSIS — E119 Type 2 diabetes mellitus without complications: Secondary | ICD-10-CM

## 2013-11-25 DIAGNOSIS — Z124 Encounter for screening for malignant neoplasm of cervix: Secondary | ICD-10-CM

## 2013-11-25 DIAGNOSIS — N760 Acute vaginitis: Secondary | ICD-10-CM | POA: Insufficient documentation

## 2013-11-25 DIAGNOSIS — F32A Depression, unspecified: Secondary | ICD-10-CM | POA: Insufficient documentation

## 2013-11-25 DIAGNOSIS — Z113 Encounter for screening for infections with a predominantly sexual mode of transmission: Secondary | ICD-10-CM | POA: Insufficient documentation

## 2013-11-25 DIAGNOSIS — F329 Major depressive disorder, single episode, unspecified: Secondary | ICD-10-CM

## 2013-11-25 LAB — CBC
HCT: 41.7 % (ref 36.0–46.0)
Hemoglobin: 14.1 g/dL (ref 12.0–15.0)
MCH: 30.4 pg (ref 26.0–34.0)
MCHC: 33.8 g/dL (ref 30.0–36.0)
MCV: 89.9 fL (ref 78.0–100.0)
Platelets: 255 10*3/uL (ref 150–400)
RBC: 4.64 MIL/uL (ref 3.87–5.11)
RDW: 13.2 % (ref 11.5–15.5)
WBC: 6.9 10*3/uL (ref 4.0–10.5)

## 2013-11-25 LAB — BASIC METABOLIC PANEL
BUN: 12 mg/dL (ref 6–23)
CO2: 26 mEq/L (ref 19–32)
Calcium: 9.4 mg/dL (ref 8.4–10.5)
Chloride: 104 mEq/L (ref 96–112)
Creat: 0.65 mg/dL (ref 0.50–1.10)
Glucose, Bld: 142 mg/dL — ABNORMAL HIGH (ref 70–99)
Potassium: 4.6 mEq/L (ref 3.5–5.3)
Sodium: 138 mEq/L (ref 135–145)

## 2013-11-25 LAB — LIPID PANEL
Cholesterol: 183 mg/dL (ref 0–200)
HDL: 36 mg/dL — ABNORMAL LOW (ref >39)
LDL Cholesterol: 124 mg/dL — ABNORMAL HIGH (ref 0–99)
Total CHOL/HDL Ratio: 5.1 Ratio
Triglycerides: 113 mg/dL (ref <150)
VLDL: 23 mg/dL (ref 0–40)

## 2013-11-25 LAB — POCT GLYCOSYLATED HEMOGLOBIN (HGB A1C): Hemoglobin A1C: 14

## 2013-11-25 LAB — GLUCOSE, POCT (MANUAL RESULT ENTRY): POC Glucose: 191 mg/dl — AB (ref 70–99)

## 2013-11-25 MED ORDER — INJECTION DEVICE FOR INSULIN DEVI: 1.0000 "pen " | Freq: Two times a day (BID) | Status: DC

## 2013-11-25 MED ORDER — PRAVASTATIN SODIUM 40 MG PO TABS: 40.0000 mg | ORAL_TABLET | Freq: Every day | ORAL | Status: DC

## 2013-11-25 MED ORDER — INSULIN ASPART PROT & ASPART (70-30 MIX) 100 UNIT/ML ~~LOC~~ SUSP: 5.0000 [IU] | Freq: Two times a day (BID) | SUBCUTANEOUS | Status: DC

## 2013-11-25 MED ORDER — INSULIN PEN NEEDLE 31G X 8 MM MISC: 1.0000 | Freq: Two times a day (BID) | Status: DC

## 2013-11-25 MED ORDER — CYCLOBENZAPRINE HCL 10 MG PO TABS: 10.0000 mg | ORAL_TABLET | Freq: Three times a day (TID) | ORAL | Status: DC | PRN

## 2013-11-25 MED ORDER — POLYETHYLENE GLYCOL 3350 17 GM/SCOOP PO POWD: 17.0000 g | Freq: Every day | ORAL | Status: DC

## 2013-11-25 MED ORDER — INSULIN NPH ISOPHANE & REGULAR (70-30) 100 UNIT/ML ~~LOC~~ SUSP: 5.0000 [IU] | Freq: Two times a day (BID) | SUBCUTANEOUS | Status: DC

## 2013-11-25 NOTE — Progress Notes (Signed)
   Subjective:    Patient ID: [name] [name], female    DOB: [birth year], 53 y.o.   MRN: [id number]  HPI 53 yo F NP presents to establish care and discuss the following:  1. LLQ abdominal pain: started on the morning of 11/22/13 upon getting out of bed. Pain is severe 10/10 with standing. No pain with sitting or lying down. No fever, emesis, diarrhea. Some nausea since starting Effexor. No BM since 2 days ago, small about, non-bloody. She is s/p hysterectomy.   2. DM2:does check CBGs at home range is 150-200. Mom with DM type 1. Denies low CBGs. ROS as per above.   3. Depression: taking effexor will start BID on 11/27/13. Has taken a few trazadone.   Soc Hx: non smoker  Review of Systems As per HPI     Objective:   Physical Exam BP 109/72  Pulse 81  Temp(Src) 98.7 F (37.1 C) (Oral)  Resp 16  Ht 5\' 6"  (1.676 m)  Wt 164 lb (74.39 kg)  BMI 26.48 kg/m2  SpO2 95% General appearance: alert, cooperative and no distress Head: Normocephalic, without obvious abnormality, atraumatic Eyes: conjunctivae/corneas clear. PERRL, EOM's intact.  Ears: normal TM's and external ear canals both ears Nose: Nares normal. Septum midline. Mucosa normal. No drainage or sinus tenderness. Throat: lips, mucosa, and tongue normal; teeth and gums normal Lungs: clear to auscultation bilaterally Heart: regular rate and rhythm, S1, S2 normal, no murmur, click, rub or gallop Abdomen: obese, flat, NABS, TTP LLQ w/o rebound or guarding. pain with standing. no masses  Back: no CVA tenderness  Pelvic: cervix normal in appearance, external genitalia normal, no adnexal masses or tenderness, no cervical motion tenderness, rectovaginal septum normal, uterus surgically absent and vagina normal without discharge Rectal: no stool in vault. No stool for FOBT. Smear of lube was negative.  Extremities: extremities normal, atraumatic, no cyanosis or edema Skin: Skin color, texture, turgor normal. No rashes or lesions Neurologic:  Grossly normal   Lab Results  Component Value Date   HGBA1C 14.0 11/25/2013        Assessment & Plan:

## 2013-11-25 NOTE — Assessment & Plan Note (Signed)
Pap done today Also GC/Chlam Wet prep

## 2013-11-25 NOTE — Patient Instructions (Signed)
Maleigha,  Thank you for coming in today.  Please continue current medications.  Adding insulin and statin for diabetes.  For abdominal pain adding flexeril and miralax.  Checking blood work.  F/u in one week for abdominal pain. I suspect constipation. Am concerned for possible hernia, diverticulitis is less likely.   Do not wait and go to ED if you develop fever, vomiting, blood in stool.   Dr. Adrian Blackwater   For your diet:  1. Make sure to eat breakfast, lunch and dinner (also may add one snack mid morning or mid afternoon).  2. Carbs: no more than 2 servings (30 gram/2oz) per meal and 1 serving per snack.  3. Exercise such that you sweat some and your heart rate goes up most days of the week.  4. Water, water, water  5. Check blood sugar 2-3 x per week to get a feel for how different foods effect your blood sugar:  Goal fasting 70-130  Goal after eating < 200 6. Beware of hypoglycemia which is blood sugar < 70 with or without symptoms  Beware of hypoglycemia which is blood sugar less than 70 with or without symptoms.  The common symptoms of hypoglycemia are: sweating, pale or dusty skin, excessive fatigue, nausea, jitteriness. If you experience these symptoms please check your blood sugar.  My blood sugar is low  (less than 70). What should I do?  If low 60- 70, with or without symptoms. Do not take insulin or oral medication,  eat or drink carbohydrates right away (juice, sweets, breads, fruit). Recheck blood sugar in 2 hrs. If still low call your doctor. If normal take medication.   If 60-40 without symptoms.  Same as above and call your doctor.   If 60-40 with symptoms. Same as above. If symptoms resolve within 30 minutes of eating or drinking carbohydrates call your doctor. If symptoms persist call 911.   If less than 40 with or without symptoms. Same as above. Do not wait 30 minutes, instead call 911.

## 2013-11-25 NOTE — Progress Notes (Signed)
Pt here HFU- Diabetes, panic attack 4.26.15 in ER States she has been noncompliant with Diabetes management Request eye exam for blurry vision  C/o left lower abdominal sharp,knif like pain causing her to double over without n/v/chills Last pap smear 2007,normal Not taking medication for anxiety- screening for PQ4 given A1c/CBG obtaained A1c

## 2013-11-25 NOTE — Assessment & Plan Note (Signed)
A: uncontrolled P: Statin therapy with pravastatin Continue metformin Check lipids novolog 70/30 5 U BID with plan to titrate up. Information regarding goal CBGs and hypoglycemia management provided.

## 2013-11-25 NOTE — Assessment & Plan Note (Addendum)
A: concerning for abdominal wall pain vs constipation vs hernia vs medication SE (metformin) vs diverticulitis. FOBT negative todayu. No fever. Pain with standing only favors MSK pain/hernia.  P:  miralax Flexeril Check CBC to eval WBC Reviewed s/s to prompt patient to seek immediate medical attention including fever, emesis, blood per stools.

## 2013-11-27 ENCOUNTER — Telehealth: Payer: Self-pay | Admitting: Family Medicine

## 2013-11-27 ENCOUNTER — Encounter: Payer: Self-pay | Admitting: Family Medicine

## 2013-11-27 NOTE — Telephone Encounter (Signed)
Called patient at home unable to leave VM. Called patient's cell.  Left voicemail.  Labs reviewed. Inquired about abdominal pain. Re-iterated reasons to seek medical attention. Keep f/u appt.

## 2013-11-28 ENCOUNTER — Telehealth: Payer: Self-pay | Admitting: *Deleted

## 2013-11-28 DIAGNOSIS — E119 Type 2 diabetes mellitus without complications: Secondary | ICD-10-CM

## 2013-11-28 LAB — CERVICOVAGINAL ANCILLARY ONLY
Chlamydia: NEGATIVE
Neisseria Gonorrhea: NEGATIVE
Wet Prep (BD Affirm): NEGATIVE
Wet Prep (BD Affirm): NEGATIVE
Wet Prep (BD Affirm): NEGATIVE

## 2013-11-28 NOTE — Telephone Encounter (Signed)
[facility] pharmacy would like to know if injection device for insulin (B-D Pen) [name] [name] is an insulin syringes and needles? If so please specify gauge, length, etc. Fax to [phone]

## 2013-11-29 NOTE — Telephone Encounter (Signed)
Patient received novolog from the [facility]. I believe it was a pen. So she just needs pen needles.  I called her [facility] to cancel the injection device.  Please call patient to clarify that she has the pen, needles and everything she needs to take her insulin.  Also please let her know that her pap smear, along with GC, chlam screen were negative.

## 2013-11-30 ENCOUNTER — Telehealth: Payer: Self-pay | Admitting: *Deleted

## 2013-11-30 NOTE — Telephone Encounter (Signed)
Left a message for patient to call back. Dr. Adrian Blackwater wants to to clarify that she has the pen, needles and everything she needs to take her insulin. Also please let her know that her pap smear, along with GC, chlam screen were negative.

## 2013-12-13 ENCOUNTER — Ambulatory Visit: Payer: No Typology Code available for payment source | Attending: Internal Medicine

## 2013-12-24 ENCOUNTER — Emergency Department (HOSPITAL_COMMUNITY): Payer: No Typology Code available for payment source

## 2013-12-24 ENCOUNTER — Encounter (HOSPITAL_COMMUNITY): Payer: Self-pay | Admitting: Emergency Medicine

## 2013-12-24 ENCOUNTER — Emergency Department (HOSPITAL_COMMUNITY)
Admission: EM | Admit: 2013-12-24 | Discharge: 2013-12-24 | Disposition: A | Discharge status: Home / Self Care | Payer: Self-pay | Attending: Emergency Medicine | Admitting: Emergency Medicine

## 2013-12-24 DIAGNOSIS — Z79899 Other long term (current) drug therapy: Secondary | ICD-10-CM | POA: Insufficient documentation

## 2013-12-24 DIAGNOSIS — M25531 Pain in right wrist: Secondary | ICD-10-CM

## 2013-12-24 DIAGNOSIS — K862 Cyst of pancreas: Secondary | ICD-10-CM | POA: Insufficient documentation

## 2013-12-24 DIAGNOSIS — E119 Type 2 diabetes mellitus without complications: Secondary | ICD-10-CM | POA: Insufficient documentation

## 2013-12-24 DIAGNOSIS — K863 Pseudocyst of pancreas: Secondary | ICD-10-CM

## 2013-12-24 DIAGNOSIS — M25539 Pain in unspecified wrist: Secondary | ICD-10-CM | POA: Insufficient documentation

## 2013-12-24 DIAGNOSIS — Z794 Long term (current) use of insulin: Secondary | ICD-10-CM | POA: Insufficient documentation

## 2013-12-24 DIAGNOSIS — Z888 Allergy status to other drugs, medicaments and biological substances status: Secondary | ICD-10-CM | POA: Insufficient documentation

## 2013-12-24 DIAGNOSIS — I10 Essential (primary) hypertension: Secondary | ICD-10-CM | POA: Insufficient documentation

## 2013-12-24 DIAGNOSIS — G8929 Other chronic pain: Secondary | ICD-10-CM | POA: Insufficient documentation

## 2013-12-24 DIAGNOSIS — Z87891 Personal history of nicotine dependence: Secondary | ICD-10-CM | POA: Insufficient documentation

## 2013-12-24 DIAGNOSIS — F329 Major depressive disorder, single episode, unspecified: Secondary | ICD-10-CM | POA: Insufficient documentation

## 2013-12-24 DIAGNOSIS — Z8619 Personal history of other infectious and parasitic diseases: Secondary | ICD-10-CM | POA: Insufficient documentation

## 2013-12-24 DIAGNOSIS — F3289 Other specified depressive episodes: Secondary | ICD-10-CM | POA: Insufficient documentation

## 2013-12-24 MED ORDER — KETOROLAC TROMETHAMINE 60 MG/2ML IM SOLN
30.0000 mg | Freq: Once | INTRAMUSCULAR | Status: AC
  Administered 2013-12-24: 30 mg via INTRAMUSCULAR
  Filled 2013-12-24: qty 2

## 2013-12-24 MED ORDER — IBUPROFEN 800 MG PO TABS: 800.0000 mg | ORAL_TABLET | Freq: Three times a day (TID) | ORAL | Status: DC | PRN

## 2013-12-24 MED ORDER — OXYCODONE-ACETAMINOPHEN 5-325 MG PO TABS
1.0000 | ORAL_TABLET | Freq: Once | ORAL | Status: AC
  Administered 2013-12-24: 1 via ORAL
  Filled 2013-12-24: qty 1

## 2013-12-24 MED ORDER — HYDROCODONE-ACETAMINOPHEN 5-325 MG PO TABS: 1.0000 | ORAL_TABLET | ORAL | Status: DC | PRN

## 2013-12-24 NOTE — Discharge Instructions (Signed)
Read the information below.  Use the prescribed medication as directed.  Please discuss all new medications with your pharmacist.  Do not take additional tylenol while taking the prescribed pain medication to avoid overdose.  You may return to the Emergency Department at any time for worsening condition or any new symptoms that concern you.  If you develop uncontrolled pain, weakness or numbness of the extremity, severe discoloration of the skin, or you are unable to move your arm or fingers, return to the ER for a recheck.      Wrist Pain Wrist injuries are frequent in adults and children. A sprain is an injury to the ligaments that hold your bones together. A strain is an injury to muscle or muscle cord-like structures (tendons) from stretching or pulling. Generally, when wrists are moderately tender to touch following a fall or injury, a break in the bone (fracture) may be present. Most wrist sprains or strains are better in 3 to 5 days, but complete healing may take several weeks. HOME CARE INSTRUCTIONS   Put ice on the injured area.  Put ice in a plastic bag.  Place a towel between your skin and the bag.  Leave the ice on for 15-20 minutes, 03-04 times a day, for the first 2 days.  Keep your arm raised above the level of your heart whenever possible to reduce swelling and pain.  Rest the injured area for at least 48 hours or as directed by your caregiver.  If a splint or elastic bandage has been applied, use it for as long as directed by your caregiver or until seen by a caregiver for a follow-up exam.  Only take over-the-counter or prescription medicines for pain, discomfort, or fever as directed by your caregiver.  Keep all follow-up appointments. You may need to follow up with a specialist or have follow-up X-rays. Improvement in pain level is not a guarantee that you did not fracture a bone in your wrist. The only way to determine whether or not you have a broken bone is by  X-ray. SEEK IMMEDIATE MEDICAL CARE IF:   Your fingers are swollen, very red, white, or cold and blue.  Your fingers are numb or tingling.  You have increasing pain.  You have difficulty moving your fingers. MAKE SURE YOU:   Understand these instructions.  Will watch your condition.  Will get help right away if you are not doing well or get worse. Document Released: 04/15/2005 Document Revised: 09/28/2011 Document Reviewed: 08/27/2010 [facility] Patient Information 2014 [place].

## 2013-12-24 NOTE — ED Notes (Signed)
Declined W/C at D/C and was escorted to lobby by RN. 

## 2013-12-24 NOTE — ED Provider Notes (Signed)
CSN: [id number]     Arrival date & time 12/24/13  9381 History  This chart was scribed for non-physician practitioner, Clayton Bibles, PA-C working with Neta Ehlers, MD, by Erling Conte, ED Scribe. This patient was seen in room TR08C/TR08C and the patient's care was started at 9:19 AM.    Chief Complaint  Patient presents with  . Wrist Pain  . Arm Pain      The history is provided by the patient. No language interpreter was used.   HPI Comments: [name] [name] is a 53 y.o. female with a h/o of DM, HTN, who presents to the Emergency Department complaining of mild, sharp, shooting, right wrist pain that began last night. She states that she was just sitting watching TV when the pain started. Patient states that the pain radiates up to her right elbow. She denies any injury to the area. Pt reports that the pain is exacerbated by movement. Pt denies any associated numbness, tingling, swelling or redness in her right wrist. She thinks she has arthritis but states this is not similar to arthritic pain she has had before. Pt presents with a arthritic glove on her right wrist but states that it is not helping with the pain. She states that she is right handed. Patient denies any fever, chills, chest pain, shortness of breath, headaches, or neck pain.      Past Medical History  Diagnosis Date  . Diabetes mellitus, type 2     No insulin  . Chest pain   . Pancreatic cyst     Endoscopic aspiration in 09/2009  . Chronic low back pain     HNP  . Headache(784.0)   . Depression   . Pneumonia 08/02/2012  . Herniated disc   . Shingles   . Hypertension ON BP meds for kidneys   Past Surgical History  Procedure Laterality Date  . Abdominal hysterectomy    . Cesarean section     Family History  Problem Relation Age of Onset  . Depression Mother     type 1   History  Substance Use Topics  . Smoking status: Former Smoker    Quit date: 08/03/1978  . Smokeless tobacco: Never Used  . Alcohol  Use: No     Comment: Former   OB History   Grav Para Term Preterm Abortions TAB SAB Ect Mult Living                 Review of Systems  Constitutional: Negative for fever and chills.  Respiratory: Negative for shortness of breath.   Cardiovascular: Negative for chest pain.  Musculoskeletal: Positive for arthralgias (right wrist). Negative for joint swelling and neck pain.  Skin: Negative for color change.  Neurological: Negative for numbness and headaches.  All other systems reviewed and are negative.     Allergies  Tramadol  Home Medications   Prior to Admission medications   Medication Sig Start Date End Date Taking? Authorizing Provider  cyclobenzaprine (FLEXERIL) 10 MG tablet Take 1 tablet (10 mg total) by mouth 3 (three) times daily as needed for muscle spasms. 11/25/13   Josalyn C Funches, MD  ibuprofen (ADVIL,MOTRIN) 200 MG tablet Take 800 mg by mouth every 6 (six) hours as needed. For pain    Historical Provider, MD  injection device for insulin (B-D PEN) DEVI 1 pen by Other route 2 (two) times daily. 11/25/13   Josalyn C Funches, MD  insulin aspart protamine- aspart (NOVOLOG MIX 70/30) (70-30) 100 UNIT/ML injection  Inject 0.05 mLs (5 Units total) into the skin 2 (two) times daily with a meal. 11/25/13   Josalyn C Funches, MD  Insulin Pen Needle ([place] SHORTLENGTH PEN NEEDLES) 31G X 8 MM MISC 1 each by Does not apply route 2 (two) times daily before a meal. 11/25/13   Josalyn C Funches, MD  metFORMIN (GLUCOPHAGE) 1000 MG tablet Take 1 tablet (1,000 mg total) by mouth 2 (two) times daily with a meal. 11/14/13   Merryl Hacker, MD  polyethylene glycol powder (GLYCOLAX/MIRALAX) powder Take 17 g by mouth daily. 11/25/13   Josalyn C Funches, MD  pravastatin (PRAVACHOL) 40 MG tablet Take 1 tablet (40 mg total) by mouth daily. 11/25/13   Minerva Ends, MD  traZODone (DESYREL) 50 MG tablet Take 50 mg by mouth at bedtime.    Historical Provider, MD  venlafaxine (EFFEXOR) 75 MG tablet Take 75  mg by mouth 2 (two) times daily with a meal.    Historical Provider, MD   Triage Vitals: BP 105/57  Pulse 82  Temp(Src) 97.4 F (36.3 C) (Axillary)  Ht 5\' 6"  (1.676 m)  Wt 161 lb (73.029 kg)  BMI 26.00 kg/m2  SpO2 97%  Physical Exam  Nursing note and vitals reviewed. Constitutional: She appears well-developed and well-nourished. No distress.  HENT:  Head: Normocephalic and atraumatic.  Neck: Neck supple.  Pulmonary/Chest: Effort normal.  Musculoskeletal: She exhibits tenderness. She exhibits no edema.  Right hand: non tender to palpation. Grip strength normal. Sensation intact, capillary refill less than 2 seconds throughout. Radial pulses intact. Pt tender on ventral wrist. Pain exacerbated with supination and extension of wrist. Tenderness throughout forearm musculature without focal tenderness. No erythema edema or warmth. No bony tenderness.  No C- spine tenderness Pt unable to perform Phalen test due to pain. Wynn Maudlin test is negative. Pain worsened with tinnel but without radiation of pain or any numbness or tingling.   Neurological: She is alert.  Skin: She is not diaphoretic.    ED Course  Procedures (including critical care time)  DIAGNOSTIC STUDIES: Oxygen Saturation is 97% on RA, normal by my interpretation.    COORDINATION OF CARE: 9:47 AM Will order diagnostic imaging of right wrist and Percocet to help with pain.  Pt advised of plan for treatment and pt agrees.     Labs Review Labs Reviewed - No data to display  Imaging Review Dg Wrist Complete Right  12/24/2013   CLINICAL DATA:  Pain.  No trauma.  EXAM: RIGHT WRIST - COMPLETE 3+ VIEW  COMPARISON:  None.  FINDINGS: There is no evidence of fracture or dislocation. There is no evidence of arthropathy or other focal bone abnormality. Soft tissues are unremarkable.  IMPRESSION: Negative.   Electronically Signed   By: Marin Olp M.D.   On: 12/24/2013 10:19     EKG Interpretation None      MDM   Final  diagnoses:  Right wrist pain    Afebrile nontoxic patient with right wrist/forearm pain without injury.  Exam unremarkable.  Xray is negative.  No e/o septic joint.  Doubt carpal tunnel or nerve entrapment. Suspect muscular etiology.  Pt given pain medication, advised RICE treatment, velcro wrist splint.  PCP follow up.  Hand surgery information provided as well with worsening symptoms.  Discussed result, findings, treatment, and follow up  with patient.  Pt given return precautions.  Pt verbalizes understanding and agrees with plan.      I personally performed the services described in this  documentation, which was scribed in my presence. The recorded information has been reviewed and is accurate.     Clayton Bibles, PA-C 12/24/13 1102

## 2013-12-24 NOTE — ED Notes (Signed)
Pt c/o pain to right wrist that radiates to right elbow onset last night. Pt denies recent injury. Pt presents with hand sleeve that she uses to help with carpal tunnel to left hand. Pt reports sleeve not helping with pain.

## 2013-12-25 NOTE — ED Provider Notes (Signed)
Medical screening examination/treatment/procedure(s) were performed by non-physician practitioner and as supervising physician I was immediately available for consultation/collaboration.  Neta Ehlers, MD 12/25/13 [phone]

## 2013-12-27 ENCOUNTER — Other Ambulatory Visit: Payer: Self-pay | Admitting: Internal Medicine

## 2013-12-27 MED ORDER — INSULIN ASPART PROT & ASPART (70-30 MIX) 100 UNIT/ML ~~LOC~~ SUSP: 5.0000 [IU] | Freq: Two times a day (BID) | SUBCUTANEOUS | Status: DC

## 2014-01-05 ENCOUNTER — Other Ambulatory Visit: Payer: Self-pay | Admitting: *Deleted

## 2014-01-06 MED ORDER — PRAVASTATIN SODIUM 40 MG PO TABS: 40.0000 mg | ORAL_TABLET | Freq: Every day | ORAL | Status: DC

## 2014-01-06 MED ORDER — METFORMIN HCL 1000 MG PO TABS: 1000.0000 mg | ORAL_TABLET | Freq: Two times a day (BID) | ORAL | Status: DC

## 2014-01-22 ENCOUNTER — Emergency Department (HOSPITAL_COMMUNITY)
Admission: EM | Admit: 2014-01-22 | Discharge: 2014-01-23 | Disposition: A | Discharge status: Home / Self Care | Payer: Self-pay | Attending: Emergency Medicine | Admitting: Emergency Medicine

## 2014-01-22 ENCOUNTER — Encounter (HOSPITAL_COMMUNITY): Payer: Self-pay | Admitting: Emergency Medicine

## 2014-01-22 ENCOUNTER — Emergency Department (HOSPITAL_COMMUNITY): Payer: No Typology Code available for payment source

## 2014-01-22 DIAGNOSIS — R0789 Other chest pain: Secondary | ICD-10-CM | POA: Insufficient documentation

## 2014-01-22 DIAGNOSIS — F329 Major depressive disorder, single episode, unspecified: Secondary | ICD-10-CM | POA: Insufficient documentation

## 2014-01-22 DIAGNOSIS — R064 Hyperventilation: Secondary | ICD-10-CM | POA: Insufficient documentation

## 2014-01-22 DIAGNOSIS — Z8739 Personal history of other diseases of the musculoskeletal system and connective tissue: Secondary | ICD-10-CM | POA: Insufficient documentation

## 2014-01-22 DIAGNOSIS — E119 Type 2 diabetes mellitus without complications: Secondary | ICD-10-CM | POA: Insufficient documentation

## 2014-01-22 DIAGNOSIS — R110 Nausea: Secondary | ICD-10-CM | POA: Insufficient documentation

## 2014-01-22 DIAGNOSIS — F411 Generalized anxiety disorder: Secondary | ICD-10-CM | POA: Insufficient documentation

## 2014-01-22 DIAGNOSIS — Z8701 Personal history of pneumonia (recurrent): Secondary | ICD-10-CM | POA: Insufficient documentation

## 2014-01-22 DIAGNOSIS — R209 Unspecified disturbances of skin sensation: Secondary | ICD-10-CM | POA: Insufficient documentation

## 2014-01-22 DIAGNOSIS — Z8719 Personal history of other diseases of the digestive system: Secondary | ICD-10-CM | POA: Insufficient documentation

## 2014-01-22 DIAGNOSIS — G8929 Other chronic pain: Secondary | ICD-10-CM | POA: Insufficient documentation

## 2014-01-22 DIAGNOSIS — Z8619 Personal history of other infectious and parasitic diseases: Secondary | ICD-10-CM | POA: Insufficient documentation

## 2014-01-22 DIAGNOSIS — Z87891 Personal history of nicotine dependence: Secondary | ICD-10-CM | POA: Insufficient documentation

## 2014-01-22 DIAGNOSIS — Z79899 Other long term (current) drug therapy: Secondary | ICD-10-CM | POA: Insufficient documentation

## 2014-01-22 DIAGNOSIS — R42 Dizziness and giddiness: Secondary | ICD-10-CM | POA: Insufficient documentation

## 2014-01-22 DIAGNOSIS — F3289 Other specified depressive episodes: Secondary | ICD-10-CM | POA: Insufficient documentation

## 2014-01-22 DIAGNOSIS — Z794 Long term (current) use of insulin: Secondary | ICD-10-CM | POA: Insufficient documentation

## 2014-01-22 LAB — BASIC METABOLIC PANEL
Anion gap: 20 — ABNORMAL HIGH (ref 5–15)
BUN: 16 mg/dL (ref 6–23)
CO2: 17 mEq/L — ABNORMAL LOW (ref 19–32)
Calcium: 8.9 mg/dL (ref 8.4–10.5)
Chloride: 104 mEq/L (ref 96–112)
Creatinine, Ser: 0.77 mg/dL (ref 0.50–1.10)
GFR calc Af Amer: >90 mL/min (ref >90)
GFR calc non Af Amer: >90 mL/min (ref >90)
Glucose, Bld: 83 mg/dL (ref 70–99)
Potassium: 3.6 mEq/L — ABNORMAL LOW (ref 3.7–5.3)
Sodium: 141 mEq/L (ref 137–147)

## 2014-01-22 LAB — CBC
HCT: 35.4 % — ABNORMAL LOW (ref 36.0–46.0)
Hemoglobin: 12 g/dL (ref 12.0–15.0)
MCH: 31.2 pg (ref 26.0–34.0)
MCHC: 33.9 g/dL (ref 30.0–36.0)
MCV: 91.9 fL (ref 78.0–100.0)
Platelets: 193 10*3/uL (ref 150–400)
RBC: 3.85 MIL/uL — ABNORMAL LOW (ref 3.87–5.11)
RDW: 13 % (ref 11.5–15.5)
WBC: 9.5 10*3/uL (ref 4.0–10.5)

## 2014-01-22 LAB — I-STAT TROPONIN, ED: Troponin i, poc: 0 ng/mL (ref 0.00–0.08)

## 2014-01-22 MED ORDER — LORAZEPAM 2 MG/ML IJ SOLN
1.0000 mg | Freq: Once | INTRAMUSCULAR | Status: AC
  Administered 2014-01-22: 1 mg via INTRAVENOUS
  Filled 2014-01-22: qty 1

## 2014-01-22 MED ORDER — POTASSIUM CHLORIDE CRYS ER 20 MEQ PO TBCR
40.0000 meq | EXTENDED_RELEASE_TABLET | Freq: Once | ORAL | Status: AC
  Administered 2014-01-22: 40 meq via ORAL
  Filled 2014-01-22: qty 2

## 2014-01-22 MED ORDER — SODIUM CHLORIDE 0.9 % IV BOLUS (SEPSIS)
1000.0000 mL | Freq: Once | INTRAVENOUS | Status: AC
  Administered 2014-01-22: 1000 mL via INTRAVENOUS

## 2014-01-22 NOTE — ED Notes (Signed)
Pt HR and RR decreased, pt calm and breathing at normal pace. Transported to x-ray,.

## 2014-01-22 NOTE — ED Provider Notes (Signed)
CSN: [id number]     Arrival date & time 01/22/14  2046 History   First MD Initiated Contact with Patient 01/22/14 2105     Chief Complaint  Patient presents with  . Chest Pain   HPI    History provided by the patient. The patient is a 53 year old female with history of diabetes, depression and anxiety who presents with symptoms of chest pain. Patient reports she returned home after visiting her boyfriend and shortly after walking to the door suddenly felt overwhelmed with central chest pressure and pain, shortness of breath, dizziness, nausea and tingling around her mouth and extremities. Patient went to her bedroom and checked her blood sugar and reports that it was 44. She went to the kitchen and ate a "Atkins bar". She continued to feel slightly unwell. She does not remember calling 911 but reports EMS came and put her into the EMS truck. EMS reported blood sugar 77 upon arrival. They did give 324 mg of aspirin and one sublingual nitroglycerin which seemed to help the severe chest pains. Patient does continue to feel circumoral tingling as well as tingling to bilateral upper and lower extremities. She denies any recent long travel. Does not use birth control or estrogen. No prior history of DVT or PE. No recent cough or hemoptysis. Patient does report some recent stressors involving her right payment to her landlord. She received a letter earlier today stating that she did not pay when she feels that she did pay on time. Denies any other stressors. No other aggravating or alleviating factors. No other associated symptoms.    Past Medical History  Diagnosis Date  . Diabetes mellitus, type 2     No insulin  . Chest pain   . Pancreatic cyst     Endoscopic aspiration in 09/2009  . Chronic low back pain     HNP  . Headache(784.0)   . Depression   . Pneumonia 08/02/2012  . Herniated disc   . Shingles    Past Surgical History  Procedure Laterality Date  . Abdominal hysterectomy    .  Cesarean section     Family History  Problem Relation Age of Onset  . Depression Mother     type 1   History  Substance Use Topics  . Smoking status: Former Smoker    Quit date: 08/03/1978  . Smokeless tobacco: Never Used  . Alcohol Use: No     Comment: Former   OB History   Grav Para Term Preterm Abortions TAB SAB Ect Mult Living                 Review of Systems  Constitutional: Negative for fever.  Respiratory: Positive for shortness of breath.   Cardiovascular: Positive for chest pain.  Gastrointestinal: Positive for nausea. Negative for vomiting.  Neurological: Positive for dizziness. Negative for headaches.  All other systems reviewed and are negative.     Allergies  Tramadol  Home Medications   Prior to Admission medications   Medication Sig Start Date End Date Taking? Authorizing Provider  HYDROcodone-acetaminophen (NORCO/VICODIN) 5-325 MG per tablet Take 1 tablet by mouth every 4 (four) hours as needed for moderate pain or severe pain. 12/24/13   Clayton Bibles, PA-C  ibuprofen (ADVIL,MOTRIN) 200 MG tablet Take 800 mg by mouth every 6 (six) hours as needed for mild pain. For pain    Historical Provider, MD  ibuprofen (ADVIL,MOTRIN) 800 MG tablet Take 1 tablet (800 mg total) by mouth every 8 (eight) hours  as needed for mild pain or moderate pain. 12/24/13   Clayton Bibles, PA-C  injection device for insulin (B-D PEN) DEVI 1 pen by Other route 2 (two) times daily. 11/25/13   Josalyn C Funches, MD  insulin aspart protamine- aspart (NOVOLOG MIX 70/30) (70-30) 100 UNIT/ML injection Inject 0.05 mLs (5 Units total) into the skin 2 (two) times daily with a meal. 12/27/13   Angelica Chessman, MD  Insulin Pen Needle ([place] SHORTLENGTH PEN NEEDLES) 31G X 8 MM MISC 1 each by Does not apply route 2 (two) times daily before a meal. 11/25/13   Josalyn C Funches, MD  metFORMIN (GLUCOPHAGE) 1000 MG tablet Take 1 tablet (1,000 mg total) by mouth 2 (two) times daily with a meal.    Josalyn C Funches,  MD  polyethylene glycol (MIRALAX / GLYCOLAX) packet Take 17 g by mouth daily as needed for moderate constipation.    Historical Provider, MD  pravastatin (PRAVACHOL) 40 MG tablet Take 1 tablet (40 mg total) by mouth daily.    Minerva Ends, MD  venlafaxine (EFFEXOR) 75 MG tablet Take 75 mg by mouth 2 (two) times daily with a meal.    Historical Provider, MD   BP 119/59  Temp(Src) 97.2 F (36.2 C) (Oral)  Resp 22  Ht 5\' 6"  (1.676 m)  Wt 156 lb (70.761 kg)  BMI 25.19 kg/m2  SpO2 99% Physical Exam  Nursing note and vitals reviewed. Constitutional: She is oriented to person, place, and time. She appears well-developed and well-nourished. No distress.  HENT:  Head: Normocephalic and atraumatic.  Mouth/Throat: Oropharynx is clear and moist.  Eyes: Conjunctivae and EOM are normal. Pupils are equal, round, and reactive to light.  Cardiovascular: Normal rate and regular rhythm.   No murmur heard. Pulmonary/Chest: Effort normal and breath sounds normal. No respiratory distress. She has no wheezes. She has no rales. She exhibits no tenderness.  Abdominal: Soft. There is no tenderness. There is no rebound and no guarding.  Musculoskeletal: Normal range of motion. She exhibits no edema and no tenderness.  No clinical signs concerning for DVT  Neurological: She is alert and oriented to person, place, and time.  Skin: Skin is warm and dry. No rash noted.  Psychiatric: She has a normal mood and affect. Her behavior is normal.    ED Course  Procedures   COORDINATION OF CARE:  Nursing notes reviewed. Vital signs reviewed. Initial pt interview and examination performed.   Filed Vitals:   01/22/14 2053 01/22/14 2054 01/22/14 2059  BP:   119/59  Temp:   97.2 F (36.2 C)  TempSrc:   Oral  Resp:   22  Height:  5\' 6"  (1.676 m)   Weight:  156 lb (70.761 kg)   SpO2: 98%  99%    9:15 PM-patient is seen in needed. Patient having some improvement of symptoms although still fills tingling and  tenderness around her mouth arms and legs. Also having slight tightness in the chest. No severe pains. Patient does have history of diabetes and borderline hyper cholesterolemia.  9:33 PM nurse reports the patient is hyperventilating and crying. She is continuing to complain of not feeling well and anxious. Denying chest pain. We'll give dose of Ativan for anxiety.   1:44 AM patient has had 2 negative troponins. Unconcerning EKG without acute findings. Negative chest x-ray, negative d-dimer. She has been feeling much better after Ativan. No return of chest pain. She has had improvement of her CO2. At this time I feel  the patient is stable for discharge home. She agrees and we'll plan to call her primary care provider later in the day to schedule close follow up appointment. Strict return precautions given.   Treatment plan initiated:Medications - No data to display  Results for orders placed during the hospital encounter of 01/22/14  CBC      Result Value Ref Range   WBC 9.5  4.0 - 10.5 K/uL   RBC 3.85 (*) 3.87 - 5.11 MIL/uL   Hemoglobin 12.0  12.0 - 15.0 g/dL   HCT 35.4 (*) 36.0 - 46.0 %   MCV 91.9  78.0 - 100.0 fL   MCH 31.2  26.0 - 34.0 pg   MCHC 33.9  30.0 - 36.0 g/dL   RDW 13.0  11.5 - 15.5 %   Platelets 193  150 - 400 K/uL  BASIC METABOLIC PANEL      Result Value Ref Range   Sodium 141  137 - 147 mEq/L   Potassium 3.6 (*) 3.7 - 5.3 mEq/L   Chloride 104  96 - 112 mEq/L   CO2 17 (*) 19 - 32 mEq/L   Glucose, Bld 83  70 - 99 mg/dL   BUN 16  6 - 23 mg/dL   Creatinine, Ser 0.77  0.50 - 1.10 mg/dL   Calcium 8.9  8.4 - 10.5 mg/dL   GFR calc non Af Amer >90  >90 mL/min   GFR calc Af Amer >90  >90 mL/min   Anion gap 20 (*) 5 - 15  D-DIMER, QUANTITATIVE      Result Value Ref Range   D-Dimer, Quant <0.27  0.00 - 0.48 ug/mL-FEU  I-STAT TROPOININ, ED      Result Value Ref Range   Troponin i, poc 0.00  0.00 - 0.08 ng/mL   Comment 3           I-STAT CHEM 8, ED      Result Value Ref  Range   Sodium 143  137 - 147 mEq/L   Potassium 4.0  3.7 - 5.3 mEq/L   Chloride 109  96 - 112 mEq/L   BUN 14  6 - 23 mg/dL   Creatinine, Ser 0.70  0.50 - 1.10 mg/dL   Glucose, Bld 91  70 - 99 mg/dL   Calcium, Ion 1.15  1.12 - 1.23 mmol/L   TCO2 19  0 - 100 mmol/L   Hemoglobin 11.6 (*) 12.0 - 15.0 g/dL   HCT 34.0 (*) 36.0 - 46.0 %  I-STAT TROPOININ, ED      Result Value Ref Range   Troponin i, poc 0.00  0.00 - 0.08 ng/mL   Comment 3               Imaging Review Dg Chest 2 View  01/22/2014   CLINICAL DATA:  CHEST PAIN  EXAM: CHEST  2 VIEW  COMPARISON:  Prior radiograph from 08/02/2012  FINDINGS: The cardiac and mediastinal silhouettes are stable in size and contour, and remain within normal limits.  The lungs are normally inflated. No airspace consolidation, pleural effusion, or pulmonary edema is identified. There is no pneumothorax.  No acute osseous abnormality identified.  IMPRESSION: No active cardiopulmonary disease.   Electronically Signed   By: Jeannine Boga M.D.   On: 01/22/2014 22:17     EKG Interpretation   Date/Time:  Monday January 22 2014 20:58:29 EDT Ventricular Rate:  98 PR Interval:  139 QRS Duration: 89 QT Interval:  386 QTC Calculation: 493 R  Axis:   82 Text Interpretation:  Sinus rhythm Borderline prolonged QT interval No  significant change since last tracing Confirmed by YAO  MD, DAVID ([id number])  on 01/22/2014 9:03:26 PM      MDM   Final diagnoses:  Atypical chest pain  Hyperventilation        Martie Lee, PA-C 01/23/14 0145

## 2014-01-22 NOTE — ED Notes (Signed)
Pt HR 140 and RR 46, pt hyperventilating with 02 saturation at 99%. This RN tried to ask pt what was wrong pt states she does not know "I dont know why im crying I dont know why im so anxious." RN coached pt with slower breathing. Collier [place], [place] notified. See MAR for ativan order.

## 2014-01-22 NOTE — ED Notes (Signed)
Per EMS: Pt from home with c/o 10/10 substernal cp at 2000, states she was nauseous, diaphoresis, and dizziness so she checked CBG-44. Pt states she ate a "cookie", upon arrival EMS noted CBG 77. EKG unremarkable, given 324 mg of asprin and 1 nitro with relief 3/10 cp currently. nad noted. Axo x4.

## 2014-01-23 LAB — I-STAT TROPONIN, ED: Troponin i, poc: 0 ng/mL (ref 0.00–0.08)

## 2014-01-23 LAB — I-STAT CHEM 8, ED
BUN: 14 mg/dL (ref 6–23)
Calcium, Ion: 1.15 mmol/L (ref 1.12–1.23)
Chloride: 109 mEq/L (ref 96–112)
Creatinine, Ser: 0.7 mg/dL (ref 0.50–1.10)
Glucose, Bld: 91 mg/dL (ref 70–99)
HCT: 34 % — ABNORMAL LOW (ref 36.0–46.0)
Hemoglobin: 11.6 g/dL — ABNORMAL LOW (ref 12.0–15.0)
Potassium: 4 mEq/L (ref 3.7–5.3)
Sodium: 143 mEq/L (ref 137–147)
TCO2: 19 mmol/L (ref 0–100)

## 2014-01-23 LAB — D-DIMER, QUANTITATIVE (NOT AT ARMC): D-Dimer, Quant: <0.27 ug/mL-FEU (ref 0.00–0.48)

## 2014-01-23 NOTE — Discharge Instructions (Signed)
Your laboratory testing, EKG of your heart on chest x-ray had not shown any signs for an emergent cause of your symptoms today. At this time your providers feel that you may return home and followup with your primary care provider. Please call your doctor's office today to schedule followup appointment this week. Return any time for changing or worsening symptoms.    Chest Pain (Nonspecific) It is often hard to give a specific diagnosis for the cause of chest pain. There is always a chance that your pain could be related to something serious, such as a heart attack or a blood clot in the lungs. You need to follow up with your health care provider for further evaluation. CAUSES   Heartburn.  Pneumonia or bronchitis.  Anxiety or stress.  Inflammation around your heart (pericarditis) or lung (pleuritis or pleurisy).  A blood clot in the lung.  A collapsed lung (pneumothorax). It can develop suddenly on its own (spontaneous pneumothorax) or from trauma to the chest.  Shingles infection (herpes zoster virus). The chest wall is composed of bones, muscles, and cartilage. Any of these can be the source of the pain.  The bones can be bruised by injury.  The muscles or cartilage can be strained by coughing or overwork.  The cartilage can be affected by inflammation and become sore (costochondritis). DIAGNOSIS  Lab tests or other studies may be needed to find the cause of your pain. Your health care provider may have you take a test called an ambulatory electrocardiogram (ECG). An ECG records your heartbeat patterns over a 24-hour period. You may also have other tests, such as:  Transthoracic echocardiogram (TTE). During echocardiography, sound waves are used to evaluate how blood flows through your heart.  Transesophageal echocardiogram (TEE).  Cardiac monitoring. This allows your health care provider to monitor your heart rate and rhythm in real time.  Holter monitor. This is a portable  device that records your heartbeat and can help diagnose heart arrhythmias. It allows your health care provider to track your heart activity for several days, if needed.  Stress tests by exercise or by giving medicine that makes the heart beat faster. TREATMENT   Treatment depends on what may be causing your chest pain. Treatment may include:  Acid blockers for heartburn.  Anti-inflammatory medicine.  Pain medicine for inflammatory conditions.  Antibiotics if an infection is present.  You may be advised to change lifestyle habits. This includes stopping smoking and avoiding alcohol, caffeine, and chocolate.  You may be advised to keep your head raised (elevated) when sleeping. This reduces the chance of acid going backward from your stomach into your esophagus. Most of the time, nonspecific chest pain will improve within 2-3 days with rest and mild pain medicine.  HOME CARE INSTRUCTIONS   If antibiotics were prescribed, take them as directed. Finish them even if you start to feel better.  For the next few days, avoid physical activities that bring on chest pain. Continue physical activities as directed.  Do not use any tobacco products, including cigarettes, chewing tobacco, or electronic cigarettes.  Avoid drinking alcohol.  Only take medicine as directed by your health care provider.  Follow your health care provider's suggestions for further testing if your chest pain does not go away.  Keep any follow-up appointments you made. If you do not go to an appointment, you could develop lasting (chronic) problems with pain. If there is any problem keeping an appointment, call to reschedule. SEEK MEDICAL CARE IF:  Your chest pain does not go away, even after treatment.  You have a rash with blisters on your chest.  You have a fever. SEEK IMMEDIATE MEDICAL CARE IF:   You have increased chest pain or pain that spreads to your arm, neck, jaw, back, or abdomen.  You have  shortness of breath.  You have an increasing cough, or you cough up blood.  You have severe back or abdominal pain.  You feel nauseous or vomit.  You have severe weakness.  You faint.  You have chills. This is an emergency. Do not wait to see if the pain will go away. Get medical help at once. Call your local emergency services (911 in [place] Do not drive yourself to the hospital. MAKE SURE YOU:   Understand these instructions.  Will watch your condition.  Will get help right away if you are not doing well or get worse. Document Released: 04/15/2005 Document Revised: 07/11/2013 Document Reviewed: 02/09/2008 [facility] Patient Information 2015 [place], [place]. This information is not intended to replace advice given to you by your health care provider. Make sure you discuss any questions you have with your health care provider.

## 2014-01-23 NOTE — ED Provider Notes (Signed)
Medical screening examination/treatment/procedure(s) were performed by non-physician practitioner and as supervising physician I was immediately available for consultation/collaboration.   EKG Interpretation   Date/Time:  Monday January 22 2014 20:58:29 EDT Ventricular Rate:  98 PR Interval:  139 QRS Duration: 89 QT Interval:  386 QTC Calculation: 493 R Axis:   82 Text Interpretation:  Sinus rhythm Borderline prolonged QT interval No  significant change since last tracing Confirmed by Milica Gully  MD, Jonnell Hentges ([id number])  on 01/22/2014 9:03:26 PM       Wandra Arthurs, MD 01/23/14 1140

## 2014-02-19 ENCOUNTER — Other Ambulatory Visit: Payer: Self-pay | Admitting: Family Medicine

## 2014-03-11 ENCOUNTER — Encounter (HOSPITAL_COMMUNITY): Payer: Self-pay | Admitting: Emergency Medicine

## 2014-03-11 ENCOUNTER — Emergency Department (HOSPITAL_COMMUNITY)
Admission: EM | Admit: 2014-03-11 | Discharge: 2014-03-11 | Disposition: A | Discharge status: Home / Self Care | Payer: No Typology Code available for payment source | Attending: Emergency Medicine | Admitting: Emergency Medicine

## 2014-03-11 DIAGNOSIS — Z8739 Personal history of other diseases of the musculoskeletal system and connective tissue: Secondary | ICD-10-CM | POA: Insufficient documentation

## 2014-03-11 DIAGNOSIS — F3289 Other specified depressive episodes: Secondary | ICD-10-CM | POA: Insufficient documentation

## 2014-03-11 DIAGNOSIS — G43909 Migraine, unspecified, not intractable, without status migrainosus: Secondary | ICD-10-CM | POA: Insufficient documentation

## 2014-03-11 DIAGNOSIS — R519 Headache, unspecified: Secondary | ICD-10-CM

## 2014-03-11 DIAGNOSIS — Z8619 Personal history of other infectious and parasitic diseases: Secondary | ICD-10-CM | POA: Insufficient documentation

## 2014-03-11 DIAGNOSIS — G8929 Other chronic pain: Secondary | ICD-10-CM | POA: Insufficient documentation

## 2014-03-11 DIAGNOSIS — R51 Headache: Secondary | ICD-10-CM | POA: Insufficient documentation

## 2014-03-11 DIAGNOSIS — E119 Type 2 diabetes mellitus without complications: Secondary | ICD-10-CM | POA: Insufficient documentation

## 2014-03-11 DIAGNOSIS — F329 Major depressive disorder, single episode, unspecified: Secondary | ICD-10-CM | POA: Insufficient documentation

## 2014-03-11 DIAGNOSIS — Z79899 Other long term (current) drug therapy: Secondary | ICD-10-CM | POA: Insufficient documentation

## 2014-03-11 DIAGNOSIS — Z87891 Personal history of nicotine dependence: Secondary | ICD-10-CM | POA: Insufficient documentation

## 2014-03-11 DIAGNOSIS — Z794 Long term (current) use of insulin: Secondary | ICD-10-CM | POA: Insufficient documentation

## 2014-03-11 DIAGNOSIS — Z8719 Personal history of other diseases of the digestive system: Secondary | ICD-10-CM | POA: Insufficient documentation

## 2014-03-11 DIAGNOSIS — Z8701 Personal history of pneumonia (recurrent): Secondary | ICD-10-CM | POA: Insufficient documentation

## 2014-03-11 MED ORDER — SODIUM CHLORIDE 0.9 % IV BOLUS (SEPSIS)
1000.0000 mL | INTRAVENOUS | Status: AC
  Administered 2014-03-11: 1000 mL via INTRAVENOUS

## 2014-03-11 MED ORDER — METOCLOPRAMIDE HCL 5 MG/ML IJ SOLN
10.0000 mg | Freq: Once | INTRAMUSCULAR | Status: AC
  Administered 2014-03-11: 10 mg via INTRAVENOUS
  Filled 2014-03-11: qty 2

## 2014-03-11 MED ORDER — HYDROMORPHONE HCL PF 1 MG/ML IJ SOLN
0.5000 mg | Freq: Once | INTRAMUSCULAR | Status: AC
  Administered 2014-03-11: 0.5 mg via INTRAVENOUS
  Filled 2014-03-11: qty 1

## 2014-03-11 MED ORDER — DEXAMETHASONE SODIUM PHOSPHATE 10 MG/ML IJ SOLN
10.0000 mg | Freq: Once | INTRAMUSCULAR | Status: AC
  Administered 2014-03-11: 10 mg via INTRAVENOUS
  Filled 2014-03-11: qty 1

## 2014-03-11 MED ORDER — DIPHENHYDRAMINE HCL 50 MG/ML IJ SOLN
25.0000 mg | Freq: Once | INTRAMUSCULAR | Status: AC
  Administered 2014-03-11: 25 mg via INTRAVENOUS
  Filled 2014-03-11: qty 1

## 2014-03-11 NOTE — ED Notes (Signed)
Per EMS: Pt from a van, pulled over into fire station.  Sudden onset of headache.  Migraine hx.  Sensitive to light and sound.

## 2014-03-11 NOTE — ED Notes (Signed)
Bed: WA09 Expected date:  Expected time:  Means of arrival:  Comments: Headache

## 2014-03-11 NOTE — ED Provider Notes (Signed)
CSN: [id number]     Arrival date & time 03/11/14  1149 History   First MD Initiated Contact with Patient 03/11/14 1222     Chief Complaint  Patient presents with  . Headache     (Consider location/radiation/quality/duration/timing/severity/associated sxs/prior Treatment) Patient is a 53 y.o. female presenting with headaches. The history is provided by the patient (family).  Headache Pain location:  Generalized Quality:  Dull Radiates to:  Does not radiate Severity currently:  10/10 Severity at highest:  10/10 Onset quality:  Sudden Duration:  2 hours Timing:  Constant Progression:  Worsening Chronicity:  Recurrent Similar to prior headaches: yes   Context comment:  At rest Relieved by:  Nothing Worsened by:  Nothing tried Ineffective treatments:  None tried Associated symptoms: photophobia   Associated symptoms: no abdominal pain, no back pain, no congestion, no cough, no diarrhea, no dizziness, no pain, no fatigue, no fever, no nausea, no neck pain and no vomiting     Past Medical History  Diagnosis Date  . Diabetes mellitus, type 2     No insulin  . Chest pain   . Pancreatic cyst     Endoscopic aspiration in 09/2009  . Chronic low back pain     HNP  . Headache(784.0)   . Depression   . Pneumonia 08/02/2012  . Herniated disc   . Shingles    Past Surgical History  Procedure Laterality Date  . Abdominal hysterectomy    . Cesarean section     Family History  Problem Relation Age of Onset  . Depression Mother     type 1   History  Substance Use Topics  . Smoking status: Former Smoker    Quit date: 08/03/1978  . Smokeless tobacco: Never Used  . Alcohol Use: No     Comment: Former   OB History   Grav Para Term Preterm Abortions TAB SAB Ect Mult Living                 Review of Systems  Constitutional: Negative for fever and fatigue.  HENT: Negative for congestion and drooling.   Eyes: Positive for photophobia. Negative for pain.  Respiratory:  Negative for cough and shortness of breath.   Cardiovascular: Negative for chest pain.  Gastrointestinal: Negative for nausea, vomiting, abdominal pain and diarrhea.  Genitourinary: Negative for dysuria and hematuria.  Musculoskeletal: Negative for back pain, gait problem and neck pain.  Skin: Negative for color change.  Neurological: Positive for headaches. Negative for dizziness.       Phonophobia  Hematological: Negative for adenopathy.  Psychiatric/Behavioral: Negative for behavioral problems.  All other systems reviewed and are negative.     Allergies  Tramadol  Home Medications   Prior to Admission medications   Medication Sig Start Date End Date Taking? Authorizing Provider  insulin aspart protamine- aspart (NOVOLOG MIX 70/30) (70-30) 100 UNIT/ML injection Inject 0.05 mLs (5 Units total) into the skin 2 (two) times daily with a meal. 12/27/13  Yes Angelica Chessman, MD  metFORMIN (GLUCOPHAGE) 1000 MG tablet Take 1 tablet (1,000 mg total) by mouth 2 (two) times daily with a meal.   Yes Josalyn C Funches, MD  pravastatin (PRAVACHOL) 40 MG tablet Take 1 tablet (40 mg total) by mouth daily.   Yes Minerva Ends, MD  venlafaxine (EFFEXOR) 75 MG tablet Take 75 mg by mouth 2 (two) times daily with a meal.   Yes Historical Provider, MD  HYDROcodone-acetaminophen (NORCO/VICODIN) 5-325 MG per tablet Take 1 tablet  by mouth every 4 (four) hours as needed for moderate pain or severe pain. 12/24/13   Clayton Bibles, PA-C   BP 142/80  Pulse 74  Temp(Src) 98.7 F (37.1 C) (Oral)  Resp 20  SpO2 98% Physical Exam  Nursing note and vitals reviewed. Constitutional: She is oriented to person, place, and time. She appears well-developed and well-nourished.  HENT:  Head: Normocephalic.  Mouth/Throat: No oropharyngeal exudate.  Eyes: Conjunctivae and EOM are normal. Pupils are equal, round, and reactive to light.  Neck: Normal range of motion. Neck supple.  Cardiovascular: Normal rate, regular  rhythm, normal heart sounds and intact distal pulses.  Exam reveals no gallop and no friction rub.   No murmur heard. Pulmonary/Chest: Effort normal and breath sounds normal. No respiratory distress. She has no wheezes.  Abdominal: Soft. Bowel sounds are normal. There is no tenderness. There is no rebound and no guarding.  Musculoskeletal: Normal range of motion. She exhibits no edema and no tenderness.  Neurological: She is alert and oriented to person, place, and time. She displays a negative Romberg sign.  alert, oriented x3 speech: normal in context and clarity memory: intact grossly cranial nerves II-XII: intact motor strength: full proximally and distally no involuntary movements or tremors sensation: intact to light touch diffusely  cerebellar: finger-to-nose and heel-to-shin intact gait: normal forwards and backwards   Skin: Skin is warm and dry.  Psychiatric: She has a normal mood and affect. Her behavior is normal.    ED Course  Procedures (including critical care time) Labs Review Labs Reviewed - No data to display  Imaging Review No results found.   EKG Interpretation None      MDM   Final diagnoses:  Headache, unspecified headache type    1:04 PM 53 y.o. female with a history of diabetes and migraines who presents with a migraine. She began feeling ill at church and developed a brisk onset headache around 11 AM. The patient is in a lot of pain on exam and is difficult to exam and question. She states that she has a history of migraines and they typically present like this. The family is here on exam and agree. Will treat with headache cocktail and a small amount of Dilaudid given the limited exam due to her pain. Will reassess shortly. Given previous headaches with similar presentations I think it is reasonable to try symptomatic management first. No fevers, no hx of traumatic injury.   HA improved, now 4/10. She has a normal neurologic exam. I again inquired  about her headache to see if she had similar symptoms in the past. She states that this headache is consistent with previous headaches and does not think a head CT is warranted. She states that she has had multiple imaging studies of her head in the past including MRI which were noncontributory. I think this is reasonable given her headache history.  2:42 PM:  I have discussed the diagnosis/risks/treatment options with the patient and believe the pt to be eligible for discharge home to follow-up with her pcp as needed. We also discussed returning to the ED immediately if new or worsening sx occur. We discussed the sx which are most concerning (e.g., worsening HA, vomiting, AMS) that necessitate immediate return. Medications administered to the patient during their visit and any new prescriptions provided to the patient are listed below.  Medications given during this visit Medications  sodium chloride 0.9 % bolus 1,000 mL (1,000 mLs Intravenous New Bag/Given 03/11/14 1333)  metoCLOPramide (REGLAN)  injection 10 mg (10 mg Intravenous Given 03/11/14 1333)  diphenhydrAMINE (BENADRYL) injection 25 mg (25 mg Intravenous Given 03/11/14 1333)  dexamethasone (DECADRON) injection 10 mg (10 mg Intravenous Given 03/11/14 1333)  HYDROmorphone (DILAUDID) injection 0.5 mg (0.5 mg Intravenous Given 03/11/14 1333)    New Prescriptions   No medications on file     Pamella Pert, MD 03/11/14 1447

## 2014-03-22 ENCOUNTER — Encounter: Payer: Self-pay | Admitting: Family Medicine

## 2014-03-22 ENCOUNTER — Ambulatory Visit: Payer: No Typology Code available for payment source | Attending: Family Medicine | Admitting: Family Medicine

## 2014-03-22 VITALS — BP 99/61 | HR 68 | Temp 98.0°F | Resp 16 | Ht 66.0 in | Wt 147.2 lb

## 2014-03-22 DIAGNOSIS — E119 Type 2 diabetes mellitus without complications: Secondary | ICD-10-CM | POA: Insufficient documentation

## 2014-03-22 DIAGNOSIS — R1032 Left lower quadrant pain: Secondary | ICD-10-CM

## 2014-03-22 DIAGNOSIS — Z124 Encounter for screening for malignant neoplasm of cervix: Secondary | ICD-10-CM

## 2014-03-22 DIAGNOSIS — R739 Hyperglycemia, unspecified: Secondary | ICD-10-CM

## 2014-03-22 DIAGNOSIS — Z23 Encounter for immunization: Secondary | ICD-10-CM

## 2014-03-22 DIAGNOSIS — R7309 Other abnormal glucose: Secondary | ICD-10-CM

## 2014-03-22 DIAGNOSIS — E118 Type 2 diabetes mellitus with unspecified complications: Secondary | ICD-10-CM

## 2014-03-22 LAB — GLUCOSE, POCT (MANUAL RESULT ENTRY): POC Glucose: 92 mg/dl (ref 70–99)

## 2014-03-22 LAB — POCT GLYCOSYLATED HEMOGLOBIN (HGB A1C): Hemoglobin A1C: 5.7

## 2014-03-22 MED ORDER — METFORMIN HCL 1000 MG PO TABS: 500.0000 mg | ORAL_TABLET | Freq: Two times a day (BID) | ORAL | Status: DC

## 2014-03-22 NOTE — Patient Instructions (Addendum)
Ms. Exantus,  Thank you for coming in today. You have excellent numbers! Awesome job! Please continue metformin, your low carb/low fat diet and exercise.  Follow up in 6 months, call for appointment.  Dr. Adrian Blackwater   Diet Recommendations for Diabetes   Starchy (carb) foods include: Bread, rice, pasta, potatoes, corn, crackers, bagels, muffins, all baked goods.   Protein foods include: Meat, fish, poultry, eggs, dairy foods, and beans such as pinto and kidney beans (beans also provide carbohydrate).   1. Eat at least 3 meals and 1-2 snacks per day. Never go more than 4-5 hours while awake without eating.  2. Limit starchy foods to TWO per meal and ONE per snack. ONE portion of a starchy  food is equal to the following:   - ONE slice of bread (or its equivalent, such as half of a hamburger bun).   - 1/2 cup of a "scoopable" starchy food such as potatoes or rice.   - 1 OUNCE (28 grams) of starchy snack foods such as crackers or pretzels (look on label).   - 15 grams of carbohydrate as shown on food label.  3. Both lunch and dinner should include a protein food, a carb food, and vegetables.   - Obtain twice as many veg's as protein or carbohydrate foods for both lunch and dinner.   - Try to keep frozen veg's on hand for a quick vegetable serving.     - Fresh or frozen veg's are best.  4. Breakfast should always include protein.

## 2014-03-22 NOTE — Assessment & Plan Note (Signed)
A: DM very well controlled P: No insulin Decrease metformin to 500 mg BID from 1000 F/u in 6 months  No need for statin Info about low carb diet.

## 2014-03-22 NOTE — Assessment & Plan Note (Signed)
Resolved

## 2014-03-22 NOTE — Assessment & Plan Note (Signed)
Normal pap

## 2014-03-22 NOTE — Progress Notes (Signed)
   Subjective:    Patient ID: [name] [name], female    DOB: [birth year], 53 y.o.   MRN: [id number] CC: DM2 f/u  HPI 1. CHRONIC DIABETES  Disease Monitoring  Blood Sugar Ranges: does not check, one low down to 40 in July went to ED   Polyuria: no   Visual problems: no   Medication Compliance: yes  Medication Side Effects  Hypoglycemia: yes, in July   [place] Exam: not done   Foot Exam: done today   Diet pattern: low carb, low fat   Exercise: daily, walking   Soc Hx: former smoker  Review of Systems As per HPI     Objective:   Physical Exam BP 99/61  Pulse 68  Temp(Src) 98 F (36.7 C) (Oral)  Resp 16  Ht 5\' 6"  (1.676 m)  Wt 147 lb 3.2 oz (66.769 kg)  BMI 23.77 kg/m2  SpO2 98% General appearance: alert, cooperative and no distress Lungs: clear to auscultation bilaterally Heart: regular rate and rhythm, S1, S2 normal, no murmur, click, rub or gallop Extremities: extremities normal, atraumatic, no cyanosis or edema       Assessment & Plan:

## 2014-03-22 NOTE — Assessment & Plan Note (Signed)
Normal BPs BP Readings from Last 3 Encounters:  03/22/14 99/61  03/11/14 138/81  01/23/14 118/68

## 2014-03-22 NOTE — Progress Notes (Signed)
F/U DM no using insulin since June 2025

## 2014-05-04 ENCOUNTER — Other Ambulatory Visit: Payer: Self-pay

## 2014-08-23 ENCOUNTER — Encounter: Payer: Self-pay | Admitting: Family Medicine

## 2014-08-23 ENCOUNTER — Ambulatory Visit: Payer: Self-pay | Attending: Family Medicine | Admitting: Family Medicine

## 2014-08-23 VITALS — BP 107/69 | HR 82 | Temp 98.7°F | Resp 16 | Ht 66.0 in | Wt 166.0 lb

## 2014-08-23 DIAGNOSIS — B9689 Other specified bacterial agents as the cause of diseases classified elsewhere: Secondary | ICD-10-CM | POA: Insufficient documentation

## 2014-08-23 DIAGNOSIS — J329 Chronic sinusitis, unspecified: Secondary | ICD-10-CM

## 2014-08-23 DIAGNOSIS — E162 Hypoglycemia, unspecified: Secondary | ICD-10-CM

## 2014-08-23 DIAGNOSIS — A499 Bacterial infection, unspecified: Secondary | ICD-10-CM

## 2014-08-23 DIAGNOSIS — E119 Type 2 diabetes mellitus without complications: Secondary | ICD-10-CM

## 2014-08-23 DIAGNOSIS — Z87891 Personal history of nicotine dependence: Secondary | ICD-10-CM | POA: Insufficient documentation

## 2014-08-23 DIAGNOSIS — J328 Other chronic sinusitis: Secondary | ICD-10-CM | POA: Insufficient documentation

## 2014-08-23 LAB — GLUCOSE, POCT (MANUAL RESULT ENTRY)
POC Glucose: 66 mg/dl — AB (ref 70–99)
POC Glucose: 67 mg/dl — AB (ref 70–99)

## 2014-08-23 MED ORDER — FLUTICASONE PROPIONATE 50 MCG/ACT NA SUSP: 2.0000 | Freq: Every day | NASAL | Status: DC

## 2014-08-23 MED ORDER — AMOXICILLIN-POT CLAVULANATE 875-125 MG PO TABS: 1.0000 | ORAL_TABLET | Freq: Two times a day (BID) | ORAL | Status: DC

## 2014-08-23 NOTE — Patient Instructions (Signed)
Ms. Burston  Thank you for coming in today. You have bacterial rhinosinusitis   1. Augmentin for 7 day course  2. Flonase nightly this is the nasal steroid  3. Alternate tylenol 1000 mg and ibuprofen 600 mg every 4 hrs as needed for pain   F/u in 5 days if symptoms worsen or fail to get better.  Dr. Adrian Blackwater

## 2014-08-23 NOTE — Assessment & Plan Note (Addendum)
You have bacterial rhinosinusitis   1. Augmentin for 7 day course  2. Flonase nightly this is the nasal steroid  3. Alternate tylenol 1000 mg and ibuprofen 600 mg every 4 hrs as needed for pain   F/u in 5 days if symptoms worsen or fail to get better.

## 2014-08-23 NOTE — Progress Notes (Signed)
   Subjective:    Patient ID: [name] [name], female    DOB: August 16, 1960, 54 y.o.   MRN: [id number] CC: sinus pressure  HPI 54 yo F f/u:  1. Sinus pressures: x 10 days with green nasal discharge and nasal soreness. No fever, cough, chills, CP, SOB, sick contacts. Symptoms are not improving. Has tried tylenol, ibuprofen and nasal saline w/o improvement.   Soc Hx: former smoker, quit  Review of Systems As per HPI     Objective:   Physical Exam BP 107/69 mmHg  Pulse 82  Temp(Src) 98.7 F (37.1 C)  Resp 16  Ht 5\' 6"  (1.676 m)  Wt 166 lb (75.297 kg)  BMI 26.81 kg/m2  SpO2 98% General appearance: alert, cooperative and no distress Head: Normocephalic, without obvious abnormality, sinuses tender to percussion Eyes: conjunctivae/corneas clear. PERRL, EOM's intact.  Ears: normal TM's and external ear canals both ears Nose: no discharge, turbinates pink Throat: lips, mucosa, and tongue normal; teeth and gums normal Lungs: clear to auscultation bilaterally Heart: regular rate and rhythm, S1, S2 normal, no murmur, click, rub or gallop   CBG 67     Assessment & Plan:

## 2014-08-23 NOTE — Progress Notes (Signed)
Sinus congestion wit green mucous x 2 weeks Severe headache 10/10 left anterior   Tylenol not helping her symptoms Patient had flu shot

## 2014-09-14 ENCOUNTER — Ambulatory Visit: Payer: Self-pay | Attending: Family Medicine

## 2014-10-10 ENCOUNTER — Encounter (HOSPITAL_COMMUNITY): Payer: Self-pay | Admitting: *Deleted

## 2014-10-10 ENCOUNTER — Emergency Department (HOSPITAL_COMMUNITY)
Admission: EM | Admit: 2014-10-10 | Discharge: 2014-10-10 | Disposition: A | Discharge status: Home / Self Care | Payer: Self-pay | Attending: Emergency Medicine | Admitting: Emergency Medicine

## 2014-10-10 DIAGNOSIS — F329 Major depressive disorder, single episode, unspecified: Secondary | ICD-10-CM | POA: Insufficient documentation

## 2014-10-10 DIAGNOSIS — G8929 Other chronic pain: Secondary | ICD-10-CM | POA: Insufficient documentation

## 2014-10-10 DIAGNOSIS — R51 Headache: Secondary | ICD-10-CM | POA: Insufficient documentation

## 2014-10-10 DIAGNOSIS — Z8701 Personal history of pneumonia (recurrent): Secondary | ICD-10-CM | POA: Insufficient documentation

## 2014-10-10 DIAGNOSIS — I10 Essential (primary) hypertension: Secondary | ICD-10-CM | POA: Insufficient documentation

## 2014-10-10 DIAGNOSIS — Z7951 Long term (current) use of inhaled steroids: Secondary | ICD-10-CM | POA: Insufficient documentation

## 2014-10-10 DIAGNOSIS — M62838 Other muscle spasm: Secondary | ICD-10-CM | POA: Insufficient documentation

## 2014-10-10 DIAGNOSIS — Z79899 Other long term (current) drug therapy: Secondary | ICD-10-CM | POA: Insufficient documentation

## 2014-10-10 DIAGNOSIS — M549 Dorsalgia, unspecified: Secondary | ICD-10-CM | POA: Insufficient documentation

## 2014-10-10 DIAGNOSIS — E119 Type 2 diabetes mellitus without complications: Secondary | ICD-10-CM | POA: Insufficient documentation

## 2014-10-10 DIAGNOSIS — Z8619 Personal history of other infectious and parasitic diseases: Secondary | ICD-10-CM | POA: Insufficient documentation

## 2014-10-10 DIAGNOSIS — Z87891 Personal history of nicotine dependence: Secondary | ICD-10-CM | POA: Insufficient documentation

## 2014-10-10 DIAGNOSIS — Z792 Long term (current) use of antibiotics: Secondary | ICD-10-CM | POA: Insufficient documentation

## 2014-10-10 DIAGNOSIS — Z791 Long term (current) use of non-steroidal anti-inflammatories (NSAID): Secondary | ICD-10-CM | POA: Insufficient documentation

## 2014-10-10 DIAGNOSIS — Z8719 Personal history of other diseases of the digestive system: Secondary | ICD-10-CM | POA: Insufficient documentation

## 2014-10-10 DIAGNOSIS — Z8669 Personal history of other diseases of the nervous system and sense organs: Secondary | ICD-10-CM | POA: Insufficient documentation

## 2014-10-10 MED ORDER — LIDOCAINE 5 % EX PTCH: 1.0000 | MEDICATED_PATCH | CUTANEOUS | Status: DC

## 2014-10-10 MED ORDER — CYCLOBENZAPRINE HCL 5 MG PO TABS: 5.0000 mg | ORAL_TABLET | Freq: Three times a day (TID) | ORAL | Status: DC | PRN

## 2014-10-10 MED ORDER — NAPROXEN 500 MG PO TABS: 500.0000 mg | ORAL_TABLET | Freq: Two times a day (BID) | ORAL | Status: DC

## 2014-10-10 MED ORDER — BUPIVACAINE HCL (PF) 0.5 % IJ SOLN
10.0000 mL | Freq: Once | INTRAMUSCULAR | Status: AC
  Administered 2014-10-10: 10 mL
  Filled 2014-10-10: qty 10

## 2014-10-10 MED ORDER — HYDROCODONE-ACETAMINOPHEN 5-325 MG PO TABS: 1.0000 | ORAL_TABLET | Freq: Four times a day (QID) | ORAL | Status: DC | PRN

## 2014-10-10 NOTE — ED Provider Notes (Signed)
CSN: [id number]     Arrival date & time 10/10/14  1325 History  This chart was scribed for non-physician practitioner, Margarita Mail, PA-C, working with No att. providers found by Ladene Artist, ED Scribe. This patient was seen in room TR07C/TR07C and the patient's care was started at 2:53 PM.   Chief Complaint  Patient presents with  . Neck Pain   The history is provided by the patient. No language interpreter was used.   HPI Comments: [name] [name] is a 54 y.o. female, with a h/o glaucoma, DM, HTN,  chronic low back pain, who presents to the Emergency Department complaining of unchanged, persistent neck pain for the past 5 months. Pain radiates into L shoulder and back and is exacerbated with turning her head. She further reports "cracking" in her neck with turning it and constant, pulsating HA. Pt has not followed up for neck pain. She has been taking ibuprofen, ice and heat without relief. Pt is a nonsmoker.   Past Medical History  Diagnosis Date  . Chest pain   . Pancreatic cyst     Endoscopic aspiration in 09/2009  . Chronic low back pain     HNP  . Headache(784.0)   . Depression   . Pneumonia 08/02/2012  . Herniated disc   . Shingles   . Glaucoma   . Diabetes mellitus, type 2 2011    No insulin  . GERD (gastroesophageal reflux disease)   . Hypertension     Lab 11/2011:  CXR, EKG, CBC, TSH, BMet, troponin-normal; lipid profile: 188, 131, 36, 126    Past Surgical History  Procedure Laterality Date  . Abdominal hysterectomy    . Cesarean section     Family History  Problem Relation Age of Onset  . Depression Mother     type 1   History  Substance Use Topics  . Smoking status: Former Smoker    Quit date: 08/03/1978  . Smokeless tobacco: Never Used  . Alcohol Use: No     Comment: Former   OB History    No data available     Review of Systems  Musculoskeletal: Positive for back pain, arthralgias and neck pain.  Neurological: Positive for headaches.   Allergies   Tramadol  Home Medications   Prior to Admission medications   Medication Sig Start Date End Date Taking? Authorizing Provider  amoxicillin-clavulanate (AUGMENTIN) 875-125 MG per tablet Take 1 tablet by mouth 2 (two) times daily. 08/23/14   Boykin Nearing, MD  cyclobenzaprine (FLEXERIL) 5 MG tablet Take 1 tablet (5 mg total) by mouth 3 (three) times daily as needed for muscle spasms. 10/10/14   Margarita Mail, PA-C  fluticasone (FLONASE) 50 MCG/ACT nasal spray Place 2 sprays into both nostrils daily at 10 pm. 08/23/14   Boykin Nearing, MD  HYDROcodone-acetaminophen (NORCO) 5-325 MG per tablet Take 1 tablet by mouth every 6 (six) hours as needed. 10/10/14   Margarita Mail, PA-C  lidocaine (LIDODERM) 5 % Place 1 patch onto the skin daily. Remove & Discard patch within 12 hours or as directed by MD 10/10/14   Margarita Mail, PA-C  metFORMIN (GLUCOPHAGE) 1000 MG tablet Take 0.5 tablets (500 mg total) by mouth 2 (two) times daily with a meal. 03/22/14   Boykin Nearing, MD  naproxen (NAPROSYN) 500 MG tablet Take 1 tablet (500 mg total) by mouth 2 (two) times daily with a meal. 10/10/14   Margarita Mail, PA-C  venlafaxine (EFFEXOR) 75 MG tablet Take 75 mg by mouth 2 (two)  times daily with a meal.    Historical Provider, MD   BP 124/73 mmHg  Pulse 83  Temp(Src) 98.4 F (36.9 C) (Oral)  Resp 16  SpO2 98% Physical Exam  Constitutional: She is oriented to person, place, and time. She appears well-developed and well-nourished. No distress.  HENT:  Head: Normocephalic and atraumatic.  Eyes: Conjunctivae and EOM are normal.  Neck: Neck supple. No tracheal deviation present.  Cardiovascular: Normal rate.   Pulmonary/Chest: Effort normal. No respiratory distress.  Musculoskeletal: Normal range of motion.  Exquisitely tender to palpation in L paraspinal and L medial scapula muscles. Good grip strength.   Neurological: She is alert and oriented to person, place, and time.  Skin: Skin is warm and dry.   Psychiatric: She has a normal mood and affect. Her behavior is normal.  Nursing note and vitals reviewed.  ED Course  NERVE BLOCK Date/Time: 10/15/2014 5:35 PM Performed by: Margarita Mail Authorized by: Margarita Mail Consent: Verbal consent obtained. Risks and benefits: risks, benefits and alternatives were discussed Consent given by: patient Patient identity confirmed: verbally with patient Time out: Immediately prior to procedure a "time out" was called to verify the correct patient, procedure, equipment, support staff and site/side marked as required. Indications: pain relief Body area: head Nerve: greater occipital Laterality: left Patient sedated: no Preparation: Patient was prepped and draped in the usual sterile fashion. Patient position: sitting Needle gauge: 25 G Location technique: anatomical landmarks Local anesthetic: bupivacaine 0.5% without epinephrine Anesthetic total: 1 ml Outcome: pain improved Comments: Injections placed into the Greater  And lesser occipital, Left trapezius troigger point and rhomboid. Total of 5 ml used   (including critical care time) DIAGNOSTIC STUDIES: Oxygen Saturation is 98% on RA, normal by my interpretation.    COORDINATION OF CARE: 3:00 PM-Discussed treatment plan which includes Lidoderm and follow-up with pt at bedside and pt agreed to plan.   Labs Review Labs Reviewed - No data to display  Imaging Review No results found.   EKG Interpretation None      MDM   Final diagnoses:  Neck muscle spasm    Patient with headache/neck spasm The patient was given a trigger point injection and occipital nerve block. She had significant relief of her sxs. Pain is no longer radiating to the elbow. And headache s relieved. This appears to be a musculoskeletal issue. She will be referred to her pcp. Supportive care given.  I personally performed the services described in this documentation, which was scribed in my presence.  The recorded information has been reviewed and is accurate.       Margarita Mail, PA-C 10/15/14 [place], MD 10/19/14 [id number]  Daleen Bo, MD 10/19/14 [phone]

## 2014-10-10 NOTE — ED Notes (Addendum)
Pt reports hx of back pain. Having neck pain for extended amount of time that is not improving. Is now radiating down her back and into left shoulder. Pain increases when turning her head.Denies injury, ambulatory at triage.

## 2014-10-10 NOTE — Discharge Instructions (Signed)
Heat Therapy Heat therapy can help ease sore, stiff, injured, and tight muscles and joints. Heat relaxes your muscles, which may help ease your pain.  RISKS AND COMPLICATIONS If you have any of the following conditions, do not use heat therapy unless your health care provider has approved:  Poor circulation.  Healing wounds or scarred skin in the area being treated.  Diabetes, heart disease, or high blood pressure.  Not being able to feel (numbness) the area being treated.  Unusual swelling of the area being treated.  Active infections.  Blood clots.  Cancer.  Inability to communicate pain. This may include young children and people who have problems with their brain function (dementia).  Pregnancy. Heat therapy should only be used on old, pre-existing, or long-lasting (chronic) injuries. Do not use heat therapy on new injuries unless directed by your health care provider. HOW TO USE HEAT THERAPY There are several different kinds of heat therapy, including:  Moist heat pack.  Warm water bath.  Hot water bottle.  Electric heating pad.  Heated gel pack.  Heated wrap.  Electric heating pad. Use the heat therapy method suggested by your health care provider. Follow your health care provider's instructions on when and how to use heat therapy. GENERAL HEAT THERAPY RECOMMENDATIONS  Do not sleep while using heat therapy. Only use heat therapy while you are awake.  Your skin may turn pink while using heat therapy. Do not use heat therapy if your skin turns red.  Do not use heat therapy if you have new pain.  High heat or long exposure to heat can cause burns. Be careful when using heat therapy to avoid burning your skin.  Do not use heat therapy on areas of your skin that are already irritated, such as with a rash or sunburn. SEEK MEDICAL CARE IF:  You have blisters, redness, swelling, or numbness.  You have new pain.  Your pain is worse. MAKE SURE  YOU:  Understand these instructions.  Will watch your condition.  Will get help right away if you are not doing well or get worse. Document Released: 09/28/2011 Document Revised: 11/20/2013 Document Reviewed: 08/29/2013 [facility] Patient Information 2015 [place], [place]. This information is not intended to replace advice given to you by your health care provider. Make sure you discuss any questions you have with your health care provider.  Muscle Cramps and Spasms Muscle cramps and spasms occur when a muscle or muscles tighten and you have no control over this tightening (involuntary muscle contraction). They are a common problem and can develop in any muscle. The most common place is in the calf muscles of the leg. Both muscle cramps and muscle spasms are involuntary muscle contractions, but they also have differences:   Muscle cramps are sporadic and painful. They may last a few seconds to a quarter of an hour. Muscle cramps are often more forceful and last longer than muscle spasms.  Muscle spasms may or may not be painful. They may also last just a few seconds or much longer. CAUSES  It is uncommon for cramps or spasms to be due to a serious underlying problem. In many cases, the cause of cramps or spasms is unknown. Some common causes are:   Overexertion.   Overuse from repetitive motions (doing the same thing over and over).   Remaining in a certain position for a long period of time.   Improper preparation, form, or technique while performing a sport or activity.   Dehydration.   Injury.  Side effects of some medicines.   Abnormally low levels of the salts and ions in your blood (electrolytes), especially potassium and calcium. This could happen if you are taking water pills (diuretics) or you are pregnant.  Some underlying medical problems can make it more likely to develop cramps or spasms. These include, but are not limited to:   Diabetes.   Parkinson disease.    Hormone disorders, such as thyroid problems.   Alcohol abuse.   Diseases specific to muscles, joints, and bones.   Blood vessel disease where not enough blood is getting to the muscles.  HOME CARE INSTRUCTIONS   Stay well hydrated. Drink enough water and fluids to keep your urine clear or pale yellow.  It may be helpful to massage, stretch, and relax the affected muscle.  For tight or tense muscles, use a warm towel, heating pad, or hot shower water directed to the affected area.  If you are sore or have pain after a cramp or spasm, applying ice to the affected area may relieve discomfort.  Put ice in a plastic bag.  Place a towel between your skin and the bag.  Leave the ice on for 15-20 minutes, 03-04 times a day.  Medicines used to treat a known cause of cramps or spasms may help reduce their frequency or severity. Only take over-the-counter or prescription medicines as directed by your caregiver. SEEK MEDICAL CARE IF:  Your cramps or spasms get more severe, more frequent, or do not improve over time.  MAKE SURE YOU:   Understand these instructions.  Will watch your condition.  Will get help right away if you are not doing well or get worse. Document Released: 12/26/2001 Document Revised: 10/31/2012 Document Reviewed: 06/22/2012 [facility] Patient Information 2015 [place], [place]. This information is not intended to replace advice given to you by your health care provider. Make sure you discuss any questions you have with your health care provider. Trigger Point Injection Trigger points are areas where you have muscle pain. A trigger point injection is a shot given in the trigger point to relieve that pain. A trigger point might feel like a knot in your muscle. It hurts to press on a trigger point. Sometimes the pain spreads out (radiates) to other parts of the body. For example, pressing on a trigger point in your shoulder might cause pain in your arm or neck. You might  have one trigger point. Or, you might have more than one. People often have trigger points in their upper back and lower back. They also occur often in the neck and shoulders. Pain from a trigger point lasts for a long time. It can make it hard to keep moving. You might not be able to do the exercise or physical therapy that could help you deal with the pain. A trigger point injection may help. It does not work for everyone. But, it may relieve your pain for a few days or a few months. A trigger point injection does not cure long-lasting (chronic) pain. LET YOUR CAREGIVER KNOW ABOUT:  Any allergies (especially to latex, lidocaine, or steroids).  Blood-thinning medicines that you take. These drugs can lead to bleeding or bruising after an injection. They include:  Aspirin.  Ibuprofen.  Clopidogrel.  Warfarin.  Other medicines you take. This includes all vitamins, herbs, eyedrops, over-the-counter medicines, and creams.  Use of steroids.  Recent infections.  Past problems with numbing medicines.  Bleeding problems.  Surgeries you have had.  Other health problems. RISKS  AND COMPLICATIONS A trigger point injection is a safe treatment. However, problems may develop, such as:  Minor side effects usually go away in 1 to 2 days. These may include:  Soreness.  Bruising.  Stiffness.  More serious problems are rare. But, they may include:  Bleeding under the skin (hematoma).  Skin infection.  Breaking off of the needle under your skin.  Lung puncture.  The trigger point injection may not work for you. BEFORE THE PROCEDURE You may need to stop taking any medicine that thins your blood. This is to prevent bleeding and bruising. Usually these medicines are stopped several days before the injection. No other preparation is needed. PROCEDURE  A trigger point injection can be given in your caregiver's office or in a clinic. Each injection takes 2 minutes or less.  Your  caregiver will feel for trigger points. The caregiver may use a marker to circle the area for the injection.  The skin over the trigger point will be washed with a germ-killing (antiseptic) solution.  The caregiver pinches the spot for the injection.  Then, a very thin needle is used for the shot. You may feel pain or a twitching feeling when the needle enters the trigger point.  A numbing solution may be injected into the trigger point. Sometimes a drug to keep down swelling, redness, and warmth (inflammation) is also injected.  Your caregiver moves the needle around the trigger zone until the tightness and twitching goes away.  After the injection, your caregiver may put gentle pressure over the injection site.  Then it is covered with a bandage. AFTER THE PROCEDURE  You can go right home after the injection.  The bandage can be taken off after a few hours.  You may feel sore and stiff for 1 to 2 days.  Go back to your regular activities slowly. Your caregiver may ask you to stretch your muscles. Do not do anything that takes extra energy for a few days.  Follow your caregiver's instructions to manage and treat other pain. Document Released: 06/25/2011 Document Revised: 10/31/2012 Document Reviewed: 06/25/2011 [facility] Patient Information 2015 [place], [place]. This information is not intended to replace advice given to you by your health care provider. Make sure you discuss any questions you have with your health care provider.

## 2014-10-23 ENCOUNTER — Encounter: Payer: Self-pay | Admitting: Family Medicine

## 2014-10-23 ENCOUNTER — Ambulatory Visit: Payer: Self-pay | Attending: Family Medicine | Admitting: Family Medicine

## 2014-10-23 ENCOUNTER — Ambulatory Visit (HOSPITAL_COMMUNITY)
Admission: RE | Admit: 2014-10-23 | Discharge: 2014-10-23 | Disposition: A | Discharge status: Home / Self Care | Payer: Self-pay | Source: Ambulatory Visit | Attending: Family Medicine | Admitting: Family Medicine

## 2014-10-23 VITALS — BP 100/63 | HR 71 | Temp 98.1°F | Resp 16 | Ht 66.0 in | Wt 173.0 lb

## 2014-10-23 DIAGNOSIS — Z87891 Personal history of nicotine dependence: Secondary | ICD-10-CM | POA: Insufficient documentation

## 2014-10-23 DIAGNOSIS — M62838 Other muscle spasm: Secondary | ICD-10-CM

## 2014-10-23 DIAGNOSIS — E119 Type 2 diabetes mellitus without complications: Secondary | ICD-10-CM | POA: Insufficient documentation

## 2014-10-23 DIAGNOSIS — M5032 Other cervical disc degeneration, mid-cervical region: Secondary | ICD-10-CM | POA: Insufficient documentation

## 2014-10-23 DIAGNOSIS — M6248 Contracture of muscle, other site: Secondary | ICD-10-CM

## 2014-10-23 DIAGNOSIS — Z0000 Encounter for general adult medical examination without abnormal findings: Secondary | ICD-10-CM

## 2014-10-23 DIAGNOSIS — R252 Cramp and spasm: Secondary | ICD-10-CM | POA: Insufficient documentation

## 2014-10-23 DIAGNOSIS — E118 Type 2 diabetes mellitus with unspecified complications: Secondary | ICD-10-CM

## 2014-10-23 LAB — POCT GLYCOSYLATED HEMOGLOBIN (HGB A1C): Hemoglobin A1C: 7.3

## 2014-10-23 LAB — GLUCOSE, POCT (MANUAL RESULT ENTRY): POC Glucose: 222 mg/dl — AB (ref 70–99)

## 2014-10-23 MED ORDER — METFORMIN HCL 1000 MG PO TABS: 500.0000 mg | ORAL_TABLET | Freq: Two times a day (BID) | ORAL | Status: DC

## 2014-10-23 NOTE — Patient Instructions (Addendum)
[name] [name],  Thank you for coming in today.  1. Diabetes: A1c is 7.3 today. Up from 5.7.  Goal is < 7  Continue metformin 500 mg twice daily Get back to regular exercise and low carb diet Referral to ophthalmology for yearly eye exam   2. Neck muscle spasm: Restart flexeril and naproxen The naproxen is especially helpful for inflammation Please have neck x-ray done Referral to physical therapy.  If you do not have significant improvement with PT, ortho referral to Dr. Berenice Primas at [facility] is the next step.  3. Healthcare maintenance: Due for mammogram, ordered Due for screening colonoscopy, referral placed to GI  F/u in 6 weeks to f/u neck spasm  Dr. Adrian Blackwater

## 2014-10-23 NOTE — Assessment & Plan Note (Signed)
Neck muscle spasm: Restart flexeril and naproxen The naproxen is especially helpful for inflammation Please have neck x-ray done Referral to physical therapy.  If you do not have significant improvement with PT, ortho referral to Dr. Berenice Primas at [facility] is the next step.

## 2014-10-23 NOTE — Progress Notes (Signed)
   Subjective:    Patient ID: [name] [name], female    DOB: Jun 22, 1961, 54 y.o.   MRN: [id number] ED f/u neck spasm  HPI  1. Neck spasm: x 5 months. Went to ED on 10/10/14. Still having pain. Pain is L posterior neck and L occiput. limits ROM. Requesting ortho referral. Not taking naproxen or flexeril.    2. CHRONIC DIABETES  Disease Monitoring  Blood Sugar Ranges: not check   Polyuria: no   Visual problems: no   Medication Compliance: yes metformin 500 mg BID.  Medication Side Effects  Hypoglycemia: no   Preventitive Health Care  Eye Exam: due   Foot Exam: done   Soc Hx: former smoking  Review of Systems     Objective:   Physical Exam BP 100/63 mmHg  Pulse 71  Temp(Src) 98.1 F (36.7 C) (Oral)  Resp 16  Ht 5\' 6"  (1.676 m)  Wt 173 lb (78.472 kg)  BMI 27.94 kg/m2  SpO2 99% General appearance: alert, cooperative and no distress Neck: no adenopathy and thyroid not enlarged, symmetric, no tenderness/mass/nodules Lungs: clear to auscultation bilaterally Heart: regular rate and rhythm, S1, S2 normal, no murmur, click, rub or gallop Extremities: extremities normal, atraumatic, no cyanosis or edema  Lab Results  Component Value Date   HGBA1C 5.7 03/22/2014   Lab Results  Component Value Date   HGBA1C 7.30 10/23/2014        Assessment & Plan:

## 2014-10-23 NOTE — Progress Notes (Signed)
HFU neck pain Requesting Ortho referral  F/U DM

## 2014-10-23 NOTE — Assessment & Plan Note (Signed)
A: due for mammogram and screening colonoscopy P: Mammogram ordered GI referral placed

## 2014-10-23 NOTE — Assessment & Plan Note (Signed)
A: Diabetes mellitus x 2. Decline in control but still well controlled.  A1c is 7.3 today. Up from 5.7.  Goal is < 7 P:  Continue metformin 500 mg twice daily Get back to regular exercise and low carb diet Referral to ophthalmology for yearly eye exam

## 2014-10-24 LAB — MICROALBUMIN, URINE: Microalb, Ur: 0.3 mg/dL (ref <2.0)

## 2014-10-26 ENCOUNTER — Telehealth: Payer: Self-pay | Admitting: *Deleted

## 2014-10-26 NOTE — Telephone Encounter (Signed)
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  If no improvement ortho referral back to [facility]

## 2014-10-26 NOTE — Telephone Encounter (Signed)
-----   Message from Boykin Nearing, MD sent at 10/24/2014  8:46 AM EDT ----- Normal urine microalbumin

## 2014-10-26 NOTE — Telephone Encounter (Signed)
Pt aware of results 

## 2015-02-20 ENCOUNTER — Encounter: Payer: Self-pay | Admitting: Family Medicine

## 2015-02-20 ENCOUNTER — Ambulatory Visit: Payer: Self-pay | Attending: Family Medicine | Admitting: Family Medicine

## 2015-02-20 ENCOUNTER — Telehealth: Payer: Self-pay | Admitting: Family Medicine

## 2015-02-20 VITALS — BP 100/65 | HR 85 | Temp 98.0°F | Resp 16 | Ht 66.0 in | Wt 176.0 lb

## 2015-02-20 DIAGNOSIS — M5010 Cervical disc disorder with radiculopathy, unspecified cervical region: Secondary | ICD-10-CM

## 2015-02-20 DIAGNOSIS — E118 Type 2 diabetes mellitus with unspecified complications: Secondary | ICD-10-CM

## 2015-02-20 LAB — POCT URINALYSIS DIPSTICK
Bilirubin, UA: NEGATIVE
Blood, UA: NEGATIVE
Glucose, UA: 500
Ketones, UA: 15
Leukocytes, UA: NEGATIVE
Nitrite, UA: NEGATIVE
Protein, UA: 30
Spec Grav, UA: 1.015
Urobilinogen, UA: 0.2
pH, UA: 8

## 2015-02-20 LAB — BASIC METABOLIC PANEL
BUN: 13 mg/dL (ref 7–25)
CO2: 22 mmol/L (ref 20–31)
Calcium: 8.7 mg/dL (ref 8.6–10.4)
Chloride: 99 mmol/L (ref 98–110)
Creat: 0.74 mg/dL (ref 0.50–1.05)
Glucose, Bld: 265 mg/dL — ABNORMAL HIGH (ref 65–99)
Potassium: 4.1 mmol/L (ref 3.5–5.3)
Sodium: 134 mmol/L — ABNORMAL LOW (ref 135–146)

## 2015-02-20 LAB — GLUCOSE, POCT (MANUAL RESULT ENTRY): POC Glucose: 281 mg/dl — AB (ref 70–99)

## 2015-02-20 MED ORDER — IBUPROFEN 600 MG PO TABS: 600.0000 mg | ORAL_TABLET | Freq: Three times a day (TID) | ORAL | Status: DC | PRN

## 2015-02-20 MED ORDER — DIAZEPAM 5 MG PO TABS: 5.0000 mg | ORAL_TABLET | Freq: Once | ORAL | Status: DC

## 2015-02-20 MED ORDER — GABAPENTIN 300 MG PO CAPS: 300.0000 mg | ORAL_CAPSULE | Freq: Three times a day (TID) | ORAL | Status: DC

## 2015-02-20 MED ORDER — ACETAMINOPHEN-CODEINE #3 300-30 MG PO TABS: 1.0000 | ORAL_TABLET | Freq: Three times a day (TID) | ORAL | Status: DC | PRN

## 2015-02-20 MED ORDER — METFORMIN HCL 1000 MG PO TABS: 1000.0000 mg | ORAL_TABLET | Freq: Two times a day (BID) | ORAL | Status: DC

## 2015-02-20 MED ORDER — INSULIN ASPART 100 UNIT/ML ~~LOC~~ SOLN
10.0000 [IU] | Freq: Once | SUBCUTANEOUS | Status: AC
  Administered 2015-02-20: 10 [IU] via SUBCUTANEOUS

## 2015-02-20 NOTE — Progress Notes (Signed)
   Subjective:    Patient ID: [name] [name], female    DOB: 03-Feb-1961, 54 y.o.   MRN: [id number] CC: L lateral neck pain  HPI 54 yo F presents with L lateral neck pain:  1. Persistent neck pain for past 5 months or so. Worsening. No with radiculopathy with pain and numbness down L shoulder, arm to wrist. Pain at night. NSAIDs and flexeril are not helping much. 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   2. Diabetes: CBG elevated today. Patient having pain and stress. Taking metformin 500 mg BID. No symptomatic low sugars.   History  Substance Use Topics  . Smoking status: Former Smoker    Quit date: 08/03/1978  . Smokeless tobacco: Never Used  . Alcohol Use: No     Comment: Former   Review of Systems  Constitutional: Negative for fever and chills.  Eyes: Negative for visual disturbance.  Respiratory: Negative for shortness of breath.   Cardiovascular: Negative for chest pain.  Gastrointestinal: Negative for abdominal pain and blood in stool.  Musculoskeletal: Positive for neck pain. Negative for back pain and arthralgias.  Skin: Negative for rash.  Allergic/Immunologic: Negative for immunocompromised state.  Neurological: Positive for numbness.  Hematological: Negative for adenopathy. Does not bruise/bleed easily.  Psychiatric/Behavioral: Positive for sleep disturbance. Negative for suicidal ideas and dysphoric mood.      Objective:   Physical Exam BP 100/65 mmHg  Pulse 85  Temp(Src) 98 F (36.7 C) (Oral)  Resp 16  Ht 5\' 6"  (1.676 m)  Wt 176 lb (79.833 kg)  BMI 28.42 kg/m2  SpO2 95% General appearance: alert, cooperative and no distress Head: Normocephalic, without obvious abnormality, atraumatic Ears: normal TM's and external ear canals both ears Neck: no defect, skin normal, no adenopathy,  decrease ROM with limited leftward rotation to 45 degrees, TTP L lateral neck, L anterior and superior shoulder, L elbow Normal grip strength b/l, 2 + radial  pulse   Lab Results  Component Value Date   HGBA1C 7.30 10/23/2014   CBG 281    Assessment & Plan:

## 2015-02-20 NOTE — Assessment & Plan Note (Signed)
A: Cervical neck pain now with radicular symptoms concerning for spinal stenosis or disc herniation P: MRI ordered, pre MRI BMP ordered, also valium prn sedation during MRI  Ortho referral Continue ibuprofen 600 mg every 8 hrs as needed with food Gabapentin start 300 mg at night for 3 days, then twice daily for 3 days, then 3 times daily to calm the nerves in your neck  Tylenol #3 for pain

## 2015-02-20 NOTE — Assessment & Plan Note (Signed)
A; elevated CBG in office today P: A1c at next OV Increase metformin to 1000 mg BID

## 2015-02-20 NOTE — Progress Notes (Signed)
Complaining of neck pain with HA and radiating to arm and wrist

## 2015-02-20 NOTE — Telephone Encounter (Signed)
Called patient. Just saw her today in office Left VM, increase metformin to 1000 mg BID Plan to check A1c at next office visit

## 2015-02-20 NOTE — Patient Instructions (Addendum)
[name] [name],  Thank you for coming in today.  1. Cervical neck pain: MRI ordered Ortho referral Continue ibuprofen 600 mg every 8 hrs as needed with food Gabapentin start 300 mg at night for 3 days, then twice daily for 3 days, then 3 times daily to calm the nerves in your neck  Tylenol #3 for pain   You will be called with MRI results   F/u in 4 weeks   Dr. Adrian Blackwater

## 2015-02-26 ENCOUNTER — Ambulatory Visit: Payer: Self-pay | Attending: Family Medicine

## 2015-02-27 ENCOUNTER — Ambulatory Visit (HOSPITAL_COMMUNITY)
Admission: RE | Admit: 2015-02-27 | Discharge: 2015-02-27 | Disposition: A | Discharge status: Home / Self Care | Payer: Self-pay | Source: Ambulatory Visit | Attending: Family Medicine | Admitting: Family Medicine

## 2015-02-27 DIAGNOSIS — M8938 Hypertrophy of bone, other site: Secondary | ICD-10-CM | POA: Insufficient documentation

## 2015-02-27 DIAGNOSIS — M5022 Other cervical disc displacement, mid-cervical region: Secondary | ICD-10-CM | POA: Insufficient documentation

## 2015-02-27 DIAGNOSIS — M2578 Osteophyte, vertebrae: Secondary | ICD-10-CM | POA: Insufficient documentation

## 2015-02-27 DIAGNOSIS — M5010 Cervical disc disorder with radiculopathy, unspecified cervical region: Secondary | ICD-10-CM

## 2015-02-27 DIAGNOSIS — M4802 Spinal stenosis, cervical region: Secondary | ICD-10-CM | POA: Insufficient documentation

## 2015-02-27 DIAGNOSIS — M899 Disorder of bone, unspecified: Secondary | ICD-10-CM | POA: Insufficient documentation

## 2015-03-05 ENCOUNTER — Other Ambulatory Visit: Payer: Self-pay | Admitting: *Deleted

## 2015-03-05 ENCOUNTER — Telehealth: Payer: Self-pay | Admitting: Family Medicine

## 2015-03-05 DIAGNOSIS — M5010 Cervical disc disorder with radiculopathy, unspecified cervical region: Secondary | ICD-10-CM

## 2015-03-05 MED ORDER — GLUCOSE BLOOD VI STRP: ORAL_STRIP | Status: DC

## 2015-03-05 NOTE — Telephone Encounter (Signed)
Called patient left VM: Calling to discuss the results of the cervical MRI  When patient call's back please give following info   Reviewed cervical MRI reports and images there are many levels of narrowing between C4-C6 without significant stenosis  Mild disc bulging to the L at C6-7 w/o significant stenosis  Plan medical management for pain control along with PT if patient has access

## 2015-03-05 NOTE — Assessment & Plan Note (Signed)
  Reviewed cervical MRI reports and images there are many levels of narrowing between C4-C6 without significant stenosis  Mild disc bulging to the L at C6-7 w/o significant stenosis  Plan medical management for pain control along with PT if patient has acces

## 2015-03-22 ENCOUNTER — Other Ambulatory Visit: Payer: Self-pay | Admitting: Pharmacist

## 2015-03-22 MED ORDER — TRUE METRIX METER DEVI: 1.0000 | Status: DC

## 2015-03-22 MED ORDER — TRUEPLUS LANCETS 28G MISC: Status: DC

## 2015-03-22 MED ORDER — GLUCOSE BLOOD VI STRP: ORAL_STRIP | Status: DC

## 2015-03-27 ENCOUNTER — Telehealth: Payer: Self-pay | Admitting: *Deleted

## 2015-03-27 NOTE — Telephone Encounter (Signed)
-----   Message from Boykin Nearing, MD sent at 02/21/2015  8:55 AM EDT ----- Slightly low sodium and elevated blood sugar Reduce intake of carbs Increase metformin to 1000 mg twice daily

## 2015-03-27 NOTE — Telephone Encounter (Signed)
Date of Birth verified by pt Called pt today to verified if pt taking metformin as prescribed Pt stated taking metformin 1000mg  bid  Advised to maintain a low sodium and carbs diet

## 2015-06-27 ENCOUNTER — Encounter: Payer: Self-pay | Admitting: Family Medicine

## 2015-06-27 ENCOUNTER — Ambulatory Visit: Payer: Self-pay | Attending: Family Medicine | Admitting: Family Medicine

## 2015-06-27 VITALS — BP 92/61 | HR 87 | Temp 98.0°F | Resp 16 | Ht 66.0 in | Wt 176.0 lb

## 2015-06-27 DIAGNOSIS — J019 Acute sinusitis, unspecified: Secondary | ICD-10-CM | POA: Insufficient documentation

## 2015-06-27 DIAGNOSIS — Z79899 Other long term (current) drug therapy: Secondary | ICD-10-CM | POA: Insufficient documentation

## 2015-06-27 DIAGNOSIS — R51 Headache: Secondary | ICD-10-CM | POA: Insufficient documentation

## 2015-06-27 DIAGNOSIS — Z7984 Long term (current) use of oral hypoglycemic drugs: Secondary | ICD-10-CM | POA: Insufficient documentation

## 2015-06-27 DIAGNOSIS — Z0000 Encounter for general adult medical examination without abnormal findings: Secondary | ICD-10-CM

## 2015-06-27 DIAGNOSIS — J0100 Acute maxillary sinusitis, unspecified: Secondary | ICD-10-CM

## 2015-06-27 DIAGNOSIS — E1165 Type 2 diabetes mellitus with hyperglycemia: Secondary | ICD-10-CM | POA: Insufficient documentation

## 2015-06-27 DIAGNOSIS — M5412 Radiculopathy, cervical region: Secondary | ICD-10-CM | POA: Insufficient documentation

## 2015-06-27 DIAGNOSIS — IMO0001 Reserved for inherently not codable concepts without codable children: Secondary | ICD-10-CM

## 2015-06-27 DIAGNOSIS — Z87891 Personal history of nicotine dependence: Secondary | ICD-10-CM | POA: Insufficient documentation

## 2015-06-27 DIAGNOSIS — Z9114 Patient's other noncompliance with medication regimen: Secondary | ICD-10-CM | POA: Insufficient documentation

## 2015-06-27 DIAGNOSIS — J029 Acute pharyngitis, unspecified: Secondary | ICD-10-CM | POA: Insufficient documentation

## 2015-06-27 DIAGNOSIS — M5010 Cervical disc disorder with radiculopathy, unspecified cervical region: Secondary | ICD-10-CM

## 2015-06-27 LAB — POCT URINALYSIS DIPSTICK
Bilirubin, UA: NEGATIVE
Blood, UA: NEGATIVE
Glucose, UA: 500
Leukocytes, UA: NEGATIVE
Nitrite, UA: NEGATIVE
Protein, UA: NEGATIVE
Spec Grav, UA: >=1.03
Urobilinogen, UA: 0.2
pH, UA: 5.5

## 2015-06-27 LAB — POCT GLYCOSYLATED HEMOGLOBIN (HGB A1C): Hemoglobin A1C: 11.6

## 2015-06-27 LAB — GLUCOSE, POCT (MANUAL RESULT ENTRY): POC Glucose: 259 mg/dl — AB (ref 70–99)

## 2015-06-27 MED ORDER — GLUCOSE BLOOD VI STRP: 1.0000 | ORAL_STRIP | Freq: Three times a day (TID) | Status: DC

## 2015-06-27 MED ORDER — METFORMIN HCL 1000 MG PO TABS: 1000.0000 mg | ORAL_TABLET | Freq: Two times a day (BID) | ORAL | Status: DC

## 2015-06-27 MED ORDER — INSULIN ASPART 100 UNIT/ML ~~LOC~~ SOLN: 10.0000 [IU] | Freq: Once | SUBCUTANEOUS | Status: DC

## 2015-06-27 MED ORDER — AMOXICILLIN 500 MG PO CAPS: 500.0000 mg | ORAL_CAPSULE | Freq: Three times a day (TID) | ORAL | Status: DC

## 2015-06-27 MED ORDER — SITAGLIPTIN PHOSPHATE 100 MG PO TABS
100.0000 mg | ORAL_TABLET | Freq: Every day | ORAL | Status: DC
Start: 2015-06-27 — End: 2016-04-11

## 2015-06-27 MED ORDER — CETIRIZINE HCL 10 MG PO TABS: 10.0000 mg | ORAL_TABLET | Freq: Every day | ORAL | Status: DC

## 2015-06-27 MED ORDER — GABAPENTIN 300 MG PO CAPS
300.0000 mg | ORAL_CAPSULE | Freq: Three times a day (TID) | ORAL | Status: DC
Start: 2015-06-27 — End: 2016-04-11

## 2015-06-27 NOTE — Progress Notes (Signed)
Patient ID: Lillee Mooneyhan, female   DOB: 04-Dec-1960, 54 y.o.   MRN: [id number]   Subjective:  Patient ID: Yazaira Speas, female    DOB: [birth year]  Age: 54 y.o. MRN: [id number]  CC: Diabetes; Headache; and Sore Throat   HPI Avantika Shere presents for    1. L sided headache and cough: x 2 weeks. With runny nose. Persistent. No fever, chills or SOB. Dry cough. Intermittent sore throat. No known sick contacts. Patient is an uncontrolled diabetic.   2. CHRONIC DIABETES  Disease Monitoring  Blood Sugar Ranges: 300-high   Polyuria: yes   Visual problems: no   Medication Compliance: no, med x 1 month   Medication Side Effects  Hypoglycemia: no   Preventitive Health Care  Eye Exam: due   Foot Exam: due   Diet pattern: high sugar   Exercise: minimal   Social History  Substance Use Topics  . Smoking status: Former Smoker    Quit date: 08/03/1978  . Smokeless tobacco: Never Used  . Alcohol Use: No     Comment: Former    Outpatient Prescriptions Prior to Visit  Medication Sig Dispense Refill  . ibuprofen (ADVIL,MOTRIN) 600 MG tablet Take 1 tablet (600 mg total) by mouth every 8 (eight) hours as needed. 60 tablet 0  . venlafaxine (EFFEXOR) 75 MG tablet Take 75 mg by mouth 2 (two) times daily.    Marland Kitchen acetaminophen-codeine (TYLENOL #3) 300-30 MG per tablet Take 1 tablet by mouth every 8 (eight) hours as needed for moderate pain. (Patient not taking: Reported on 06/27/2015) 60 tablet 2  . Blood Glucose Monitoring Suppl (TRUE METRIX METER) DEVI 1 kit by Does not apply route as directed. Use as directed by physician (Patient not taking: Reported on 06/27/2015) 1 Device 0  . cyclobenzaprine (FLEXERIL) 5 MG tablet Take 1 tablet (5 mg total) by mouth 3 (three) times daily as needed for muscle spasms. (Patient not taking: Reported on 06/27/2015) 30 tablet 0  . diazepam (VALIUM) 5 MG tablet Take 1-2 tablets (5-10 mg total) by mouth once. (Patient not taking: Reported on 06/27/2015) 2 tablet 0  . gabapentin  (NEURONTIN) 300 MG capsule Take 1 capsule (300 mg total) by mouth 3 (three) times daily. (Patient not taking: Reported on 06/27/2015) 90 capsule 3  . glucose blood (TRUE METRIX BLOOD GLUCOSE TEST) test strip Use as instructed (Patient not taking: Reported on 06/27/2015) 100 each 12  . metFORMIN (GLUCOPHAGE) 1000 MG tablet Take 1 tablet (1,000 mg total) by mouth 2 (two) times daily with a meal. (Patient not taking: Reported on 06/27/2015) 60 tablet 2  . TRUEPLUS LANCETS 28G MISC Use as directed (Patient not taking: Reported on 06/27/2015) 100 each 12   No facility-administered medications prior to visit.    ROS Review of Systems  Constitutional: Negative for fever and chills.  HENT: Positive for rhinorrhea and sore throat.   Eyes: Negative for visual disturbance.  Respiratory: Positive for cough. Negative for shortness of breath.   Cardiovascular: Negative for chest pain.  Gastrointestinal: Negative for abdominal pain and blood in stool.  Genitourinary: Positive for frequency.  Musculoskeletal: Negative for back pain and arthralgias.  Skin: Negative for rash.  Allergic/Immunologic: Negative for immunocompromised state.  Neurological: Positive for headaches.  Hematological: Negative for adenopathy. Does not bruise/bleed easily.  Psychiatric/Behavioral: Negative for suicidal ideas and dysphoric mood.    Objective:  BP 92/61 mmHg  Pulse 87  Temp(Src) 98 F (36.7 C) (Oral)  Resp 16  Ht 5' 6"  (1.676  m)  Wt 176 lb (79.833 kg)  BMI 28.42 kg/m2  SpO2 98%  BP/Weight 06/27/2015 3/83/2919 07/25/6058  Systolic BP 92 - 045  Diastolic BP 61 - 65  Wt. (Lbs) 176 178 176  BMI 28.42 28.74 28.42   Physical Exam  Constitutional: She is oriented to person, place, and time. She appears well-developed and well-nourished. No distress.  HENT:  Head: Normocephalic and atraumatic.  Right Ear: External ear normal.  Left Ear: External ear normal.  Nose: Right sinus exhibits no maxillary sinus tenderness  and no frontal sinus tenderness. Left sinus exhibits maxillary sinus tenderness and frontal sinus tenderness.  Mouth/Throat: Oropharynx is clear and moist.  Cardiovascular: Normal rate, regular rhythm, normal heart sounds and intact distal pulses.   Pulmonary/Chest: Effort normal and breath sounds normal.  Musculoskeletal: She exhibits no edema.  Lymphadenopathy:    She has no cervical adenopathy.  Neurological: She is alert and oriented to person, place, and time.  Skin: Skin is warm and dry. No rash noted.  Psychiatric: She has a normal mood and affect.    Lab Results  Component Value Date   HGBA1C 11.60 06/27/2015  Last A1c 7.3   CBG 259 UA: 500 glucose, negative ketones  Assessment & Plan:   Problem List Items Addressed This Visit    Acute sinusitis    Acute sinusitis  10 days of amoxicillin Zyrtec daily for next week then as needed      Relevant Medications   amoxicillin (AMOXIL) 500 MG capsule   cetirizine (ZYRTEC) 10 MG tablet   Cervical disc disorder with radiculopathy of cervical region   Relevant Medications   gabapentin (NEURONTIN) 300 MG capsule   Diabetes type 2, uncontrolled ([place]) - Primary    A: uncontrolled diabetes in setting of med non compliance and high carb diet P: Metformin januvia  low carb eating.  Diabetes blood sugar goals  Fasting (in AM before breakfast, 8 hrs of no eating or drinking (except water or unsweetened coffee or tea): 90-110 2 hrs after meals: < 160,   No low sugars: nothing < 70        Relevant Medications   metFORMIN (GLUCOPHAGE) 1000 MG tablet   glucose blood (TRUE METRIX BLOOD GLUCOSE TEST) test strip   sitaGLIPtin (JANUVIA) 100 MG tablet   Other Relevant Orders   POCT glucose (manual entry) (Completed)   POCT glycosylated hemoglobin (Hb A1C) (Completed)   POCT urinalysis dipstick (Completed)   Healthcare maintenance   Relevant Orders   Flu Vaccine QUAD 36+ mos IM      No orders of the defined types were placed  in this encounter.    Follow-up: No Follow-up on file.   Boykin Nearing MD

## 2015-06-27 NOTE — Patient Instructions (Addendum)
Dayva was seen today for diabetes, headache and sore throat.  Diagnoses and all orders for this visit:  Uncontrolled type 2 diabetes mellitus without complication, without long-term current use of insulin (HCC) -     POCT glucose (manual entry) -     POCT glycosylated hemoglobin (Hb A1C) -     POCT urinalysis dipstick -     metFORMIN (GLUCOPHAGE) 1000 MG tablet; Take 1 tablet (1,000 mg total) by mouth 2 (two) times daily with a meal. -     glucose blood (TRUE METRIX BLOOD GLUCOSE TEST) test strip; 1 each by Other route 3 (three) times daily. Use as instructed -     sitaGLIPtin (JANUVIA) 100 MG tablet; Take 1 tablet (100 mg total) by mouth daily.  Healthcare maintenance -     Flu Vaccine QUAD 36+ mos IM  Acute maxillary sinusitis, recurrence not specified -     amoxicillin (AMOXIL) 500 MG capsule; Take 1 capsule (500 mg total) by mouth 3 (three) times daily. -     cetirizine (ZYRTEC) 10 MG tablet; Take 1 tablet (10 mg total) by mouth daily.  Cervical disc disorder with radiculopathy of cervical region -     gabapentin (NEURONTIN) 300 MG capsule; Take 1 capsule (300 mg total) by mouth 3 (three) times daily.  Other orders -     Discontinue: insulin aspart (novoLOG) injection 10 Units; Inject 0.1 mLs (10 Units total) into the skin once.   Acute sinusitis  10 days of amoxicillin Zyrtec daily for next week then as needed  Get back on diabetes medicine and low carb eating. Diabetes blood sugar goals  Fasting (in AM before breakfast, 8 hrs of no eating or drinking (except water or unsweetened coffee or tea): 90-110 2 hrs after meals: < 160,   No low sugars: nothing < 70   F/u in 4 weeks with log book with pharmacy for blood sugar check  F/u with me in 3 months for diabetes   Dr. Adrian Blackwater

## 2015-06-27 NOTE — Progress Notes (Signed)
F/U DM , no medication x 1 month Elevated glucose today, none fasting  C/C possible sinuse infection x 2 weeks  No tobacco user  Pain scale #2 No suicidal thought in the past two weeks

## 2015-06-27 NOTE — Assessment & Plan Note (Signed)
A: uncontrolled diabetes in setting of med non compliance and high carb diet P: Metformin januvia  low carb eating.  Diabetes blood sugar goals  Fasting (in AM before breakfast, 8 hrs of no eating or drinking (except water or unsweetened coffee or tea): 90-110 2 hrs after meals: < 160,   No low sugars: nothing < 70

## 2015-06-27 NOTE — Assessment & Plan Note (Signed)
Acute sinusitis  10 days of amoxicillin Zyrtec daily for next week then as needed

## 2016-04-09 ENCOUNTER — Inpatient Hospital Stay (HOSPITAL_COMMUNITY)
Admission: EM | Admit: 2016-04-09 | Discharge: 2016-04-11 | DRG: 149 | Disposition: A | Discharge status: Home Health | Payer: Self-pay | Attending: Internal Medicine | Admitting: Internal Medicine

## 2016-04-09 ENCOUNTER — Emergency Department (HOSPITAL_COMMUNITY): Payer: Self-pay

## 2016-04-09 ENCOUNTER — Encounter (HOSPITAL_COMMUNITY): Payer: Self-pay | Admitting: Emergency Medicine

## 2016-04-09 DIAGNOSIS — H5509 Other forms of nystagmus: Secondary | ICD-10-CM | POA: Diagnosis present

## 2016-04-09 DIAGNOSIS — I10 Essential (primary) hypertension: Secondary | ICD-10-CM | POA: Diagnosis present

## 2016-04-09 DIAGNOSIS — E785 Hyperlipidemia, unspecified: Secondary | ICD-10-CM | POA: Diagnosis present

## 2016-04-09 DIAGNOSIS — H8110 Benign paroxysmal vertigo, unspecified ear: Principal | ICD-10-CM | POA: Diagnosis present

## 2016-04-09 DIAGNOSIS — Z818 Family history of other mental and behavioral disorders: Secondary | ICD-10-CM

## 2016-04-09 DIAGNOSIS — M5010 Cervical disc disorder with radiculopathy, unspecified cervical region: Secondary | ICD-10-CM

## 2016-04-09 DIAGNOSIS — R739 Hyperglycemia, unspecified: Secondary | ICD-10-CM

## 2016-04-09 DIAGNOSIS — Z7984 Long term (current) use of oral hypoglycemic drugs: Secondary | ICD-10-CM

## 2016-04-09 DIAGNOSIS — R51 Headache: Secondary | ICD-10-CM | POA: Diagnosis present

## 2016-04-09 DIAGNOSIS — Z23 Encounter for immunization: Secondary | ICD-10-CM

## 2016-04-09 DIAGNOSIS — H409 Unspecified glaucoma: Secondary | ICD-10-CM | POA: Diagnosis present

## 2016-04-09 DIAGNOSIS — Z79899 Other long term (current) drug therapy: Secondary | ICD-10-CM

## 2016-04-09 DIAGNOSIS — Z833 Family history of diabetes mellitus: Secondary | ICD-10-CM

## 2016-04-09 DIAGNOSIS — F329 Major depressive disorder, single episode, unspecified: Secondary | ICD-10-CM | POA: Diagnosis present

## 2016-04-09 DIAGNOSIS — Z87891 Personal history of nicotine dependence: Secondary | ICD-10-CM

## 2016-04-09 DIAGNOSIS — E1165 Type 2 diabetes mellitus with hyperglycemia: Secondary | ICD-10-CM | POA: Diagnosis present

## 2016-04-09 DIAGNOSIS — R42 Dizziness and giddiness: Secondary | ICD-10-CM

## 2016-04-09 DIAGNOSIS — F32A Depression, unspecified: Secondary | ICD-10-CM | POA: Diagnosis present

## 2016-04-09 DIAGNOSIS — K219 Gastro-esophageal reflux disease without esophagitis: Secondary | ICD-10-CM | POA: Diagnosis present

## 2016-04-09 LAB — URINALYSIS, ROUTINE W REFLEX MICROSCOPIC
Bilirubin Urine: NEGATIVE
Glucose, UA: >1000 mg/dL — AB
Hgb urine dipstick: NEGATIVE
Ketones, ur: 40 mg/dL — AB
Leukocytes, UA: NEGATIVE
Nitrite: NEGATIVE
Protein, ur: NEGATIVE mg/dL
Specific Gravity, Urine: 1.039 — ABNORMAL HIGH (ref 1.005–1.030)
pH: 6 (ref 5.0–8.0)

## 2016-04-09 LAB — CBC WITH DIFFERENTIAL/PLATELET
Basophils Absolute: 0 10*3/uL (ref 0.0–0.1)
Basophils Relative: 0 %
Eosinophils Absolute: 0 10*3/uL (ref 0.0–0.7)
Eosinophils Relative: 0 %
HCT: 42.5 % (ref 36.0–46.0)
Hemoglobin: 14.9 g/dL (ref 12.0–15.0)
Lymphocytes Relative: 21 %
Lymphs Abs: 2 10*3/uL (ref 0.7–4.0)
MCH: 31.9 pg (ref 26.0–34.0)
MCHC: 35.1 g/dL (ref 30.0–36.0)
MCV: 91 fL (ref 78.0–100.0)
Monocytes Absolute: 0.5 10*3/uL (ref 0.1–1.0)
Monocytes Relative: 5 %
Neutro Abs: 7 10*3/uL (ref 1.7–7.7)
Neutrophils Relative %: 74 %
Platelets: 214 10*3/uL (ref 150–400)
RBC: 4.67 MIL/uL (ref 3.87–5.11)
RDW: 11.9 % (ref 11.5–15.5)
WBC: 9.5 10*3/uL (ref 4.0–10.5)

## 2016-04-09 LAB — COMPREHENSIVE METABOLIC PANEL
ALT: 64 U/L — ABNORMAL HIGH (ref 14–54)
AST: 40 U/L (ref 15–41)
Albumin: 3 g/dL — ABNORMAL LOW (ref 3.5–5.0)
Alkaline Phosphatase: 112 U/L (ref 38–126)
Anion gap: 7 (ref 5–15)
BUN: 8 mg/dL (ref 6–20)
CO2: 24 mmol/L (ref 22–32)
Calcium: 8.6 mg/dL — ABNORMAL LOW (ref 8.9–10.3)
Chloride: 107 mmol/L (ref 101–111)
Creatinine, Ser: 0.64 mg/dL (ref 0.44–1.00)
GFR calc Af Amer: >60 mL/min (ref >60)
GFR calc non Af Amer: >60 mL/min (ref >60)
Glucose, Bld: 234 mg/dL — ABNORMAL HIGH (ref 65–99)
Potassium: 3.9 mmol/L (ref 3.5–5.1)
Sodium: 138 mmol/L (ref 135–145)
Total Bilirubin: 0.8 mg/dL (ref 0.3–1.2)
Total Protein: 6.2 g/dL — ABNORMAL LOW (ref 6.5–8.1)

## 2016-04-09 LAB — BASIC METABOLIC PANEL
Anion gap: 15 (ref 5–15)
BUN: 11 mg/dL (ref 6–20)
CO2: 22 mmol/L (ref 22–32)
Calcium: 9.7 mg/dL (ref 8.9–10.3)
Chloride: 98 mmol/L — ABNORMAL LOW (ref 101–111)
Creatinine, Ser: 0.77 mg/dL (ref 0.44–1.00)
GFR calc Af Amer: >60 mL/min (ref >60)
GFR calc non Af Amer: >60 mL/min (ref >60)
Glucose, Bld: 552 mg/dL (ref 65–99)
Potassium: 4 mmol/L (ref 3.5–5.1)
Sodium: 135 mmol/L (ref 135–145)

## 2016-04-09 LAB — GLUCOSE, CAPILLARY: Glucose-Capillary: 223 mg/dL — ABNORMAL HIGH (ref 65–99)

## 2016-04-09 LAB — CBC
HCT: 38.5 % (ref 36.0–46.0)
Hemoglobin: 13 g/dL (ref 12.0–15.0)
MCH: 30.9 pg (ref 26.0–34.0)
MCHC: 33.8 g/dL (ref 30.0–36.0)
MCV: 91.4 fL (ref 78.0–100.0)
Platelets: 191 10*3/uL (ref 150–400)
RBC: 4.21 MIL/uL (ref 3.87–5.11)
RDW: 12.4 % (ref 11.5–15.5)
WBC: 7.7 10*3/uL (ref 4.0–10.5)

## 2016-04-09 LAB — URINE MICROSCOPIC-ADD ON: RBC / HPF: NONE SEEN RBC/hpf (ref 0–5)

## 2016-04-09 LAB — I-STAT VENOUS BLOOD GAS, ED
Acid-Base Excess: 1 mmol/L (ref 0.0–2.0)
Bicarbonate: 23.9 mmol/L (ref 20.0–28.0)
O2 Saturation: 66 %
TCO2: 25 mmol/L (ref 0–100)
pCO2, Ven: 32.6 mmHg — ABNORMAL LOW (ref 44.0–60.0)
pH, Ven: 7.473 — ABNORMAL HIGH (ref 7.250–7.430)
pO2, Ven: 32 mmHg (ref 32.0–45.0)

## 2016-04-09 LAB — CBG MONITORING, ED
Glucose-Capillary: 541 mg/dL (ref 65–99)
Glucose-Capillary: 409 mg/dL — ABNORMAL HIGH (ref 65–99)
Glucose-Capillary: 307 mg/dL — ABNORMAL HIGH (ref 65–99)
Glucose-Capillary: 267 mg/dL — ABNORMAL HIGH (ref 65–99)

## 2016-04-09 MED ORDER — DIAZEPAM 5 MG PO TABS
5.0000 mg | ORAL_TABLET | Freq: Once | ORAL | Status: AC
  Administered 2016-04-09: 5 mg via ORAL
  Filled 2016-04-09: qty 1

## 2016-04-09 MED ORDER — ENOXAPARIN SODIUM 40 MG/0.4ML ~~LOC~~ SOLN
40.0000 mg | SUBCUTANEOUS | Status: DC
  Administered 2016-04-09: 40 mg via SUBCUTANEOUS
  Filled 2016-04-09 (×2): qty 0.4

## 2016-04-09 MED ORDER — INSULIN ASPART 100 UNIT/ML ~~LOC~~ SOLN
10.0000 [IU] | Freq: Once | SUBCUTANEOUS | Status: AC
  Administered 2016-04-09: 10 [IU] via INTRAVENOUS
  Filled 2016-04-09: qty 1

## 2016-04-09 MED ORDER — SODIUM CHLORIDE 0.9% FLUSH
3.0000 mL | Freq: Two times a day (BID) | INTRAVENOUS | Status: DC
  Administered 2016-04-09 – 2016-04-11 (×4): 3 mL via INTRAVENOUS

## 2016-04-09 MED ORDER — VENLAFAXINE HCL 75 MG PO TABS
150.0000 mg | ORAL_TABLET | Freq: Two times a day (BID) | ORAL | Status: DC
  Administered 2016-04-09 – 2016-04-11 (×5): 150 mg via ORAL
  Filled 2016-04-09 (×7): qty 2

## 2016-04-09 MED ORDER — INSULIN ASPART 100 UNIT/ML ~~LOC~~ SOLN
0.0000 [IU] | Freq: Three times a day (TID) | SUBCUTANEOUS | Status: DC
  Administered 2016-04-10: 15 [IU] via SUBCUTANEOUS
  Administered 2016-04-10: 5 [IU] via SUBCUTANEOUS

## 2016-04-09 MED ORDER — SODIUM CHLORIDE 0.9 % IV BOLUS (SEPSIS)
2000.0000 mL | Freq: Once | INTRAVENOUS | Status: AC
  Administered 2016-04-09: 2000 mL via INTRAVENOUS

## 2016-04-09 MED ORDER — ONDANSETRON HCL 4 MG/2ML IJ SOLN
4.0000 mg | Freq: Once | INTRAMUSCULAR | Status: AC
  Administered 2016-04-09: 4 mg via INTRAVENOUS
  Filled 2016-04-09: qty 2

## 2016-04-09 MED ORDER — ACETAMINOPHEN 650 MG RE SUPP: 650.0000 mg | Freq: Four times a day (QID) | RECTAL | Status: DC | PRN

## 2016-04-09 MED ORDER — ACETAMINOPHEN 325 MG PO TABS
650.0000 mg | ORAL_TABLET | Freq: Four times a day (QID) | ORAL | Status: DC | PRN
  Administered 2016-04-10 – 2016-04-11 (×5): 650 mg via ORAL
  Filled 2016-04-09 (×5): qty 2

## 2016-04-09 MED ORDER — SODIUM CHLORIDE 0.9 % IV SOLN
8.0000 mg | Freq: Two times a day (BID) | INTRAVENOUS | Status: DC
  Administered 2016-04-09 – 2016-04-10 (×2): 8 mg via INTRAVENOUS
  Filled 2016-04-09 (×5): qty 4

## 2016-04-09 MED ORDER — IBUPROFEN 400 MG PO TABS
600.0000 mg | ORAL_TABLET | Freq: Once | ORAL | Status: AC
  Administered 2016-04-09: 600 mg via ORAL
  Filled 2016-04-09: qty 1

## 2016-04-09 MED ORDER — SODIUM CHLORIDE 0.9 % IV SOLN: 250.0000 mL | INTRAVENOUS | Status: DC | PRN

## 2016-04-09 MED ORDER — MECLIZINE HCL 25 MG PO TABS
25.0000 mg | ORAL_TABLET | Freq: Four times a day (QID) | ORAL | Status: DC
  Administered 2016-04-09 – 2016-04-10 (×4): 25 mg via ORAL
  Filled 2016-04-09 (×4): qty 1

## 2016-04-09 MED ORDER — SODIUM CHLORIDE 0.9 % IV BOLUS (SEPSIS)
1000.0000 mL | Freq: Once | INTRAVENOUS | Status: AC
  Administered 2016-04-09: 1000 mL via INTRAVENOUS

## 2016-04-09 MED ORDER — SODIUM CHLORIDE 0.9% FLUSH: 3.0000 mL | INTRAVENOUS | Status: DC | PRN

## 2016-04-09 MED ORDER — MECLIZINE HCL 25 MG PO TABS
25.0000 mg | ORAL_TABLET | Freq: Once | ORAL | Status: AC
  Administered 2016-04-09: 25 mg via ORAL
  Filled 2016-04-09: qty 1

## 2016-04-09 MED ORDER — DIAZEPAM 5 MG PO TABS: 5.0000 mg | ORAL_TABLET | Freq: Two times a day (BID) | ORAL | Status: DC | PRN

## 2016-04-09 NOTE — ED Notes (Signed)
Pt is resting at this time, not able to void.

## 2016-04-09 NOTE — ED Notes (Signed)
Attempted to get pt up to change bed linens after bed pan leaked.  Pt is extremely dizzy and could not stand on her own. Recommend bed pan only for the time being and roll if change of linens is needed.

## 2016-04-09 NOTE — Progress Notes (Signed)
Inpatient Diabetes Program Recommendations  AACE/ADA: New Consensus Statement on Inpatient Glycemic Control (2015)  Target Ranges:  Prepandial:   less than 140 mg/dL      Peak postprandial:   less than 180 mg/dL (1-2 hours)      Critically ill patients:  140 - 180 mg/dL   Lab Results  Component Value Date   GLUCAP 267 (H) 04/09/2016   HGBA1C 11.60 06/27/2015    Review of Glycemic Control  Results for BRYCELYN, RAK (MRN [id number]) as of 04/09/2016 14:25  Ref. Range 04/09/2016 05:15 04/09/2016 06:53 04/09/2016 08:29 04/09/2016 11:40  Glucose-Capillary Latest Ref Range: 65 - 99 mg/dL 541 (HH) 409 (H) 307 (H) 267 (H)    Diabetes history: Type 2 Outpatient Diabetes medications: Januvia 100mg  qday, Metformin 1000mg  bid Current orders for Inpatient glycemic control: none ordered  Inpatient Diabetes Program Recommendations: If the patient is to be admitted, consider checking an A1C, order Novolog sensitive correction tid and Novolog 0-5 units qhs and continue Metformin 1000mg  bid.  Will follow.  Gentry Fitz, RN, BA, MHA, CDE Diabetes Coordinator Inpatient Diabetes Program  [phone] (Team Pager) [phone] ([place]) 04/09/2016 2:33 PM

## 2016-04-09 NOTE — H&P (Signed)
Date: 04/09/2016               Patient Name:  [name] [name] MRN: [id number]  DOB: [birth year] Age / Sex: 54 y.o., female   PCP: Boykin Nearing, MD         Medical Service: Internal Medicine Teaching Service         Attending Physician: Dr. Carlyle Basques, MD    First Contact: Dr. Einar Gip Pager: [phone]  Second Contact: Dr. Julious Oka Pager: [phone]       After Hours (After 5p/  First Contact Pager: [phone]  weekends / holidays): Second Contact Pager: [phone]   Chief Complaint: "the room is spinning"  History of Present Illness:  This is a 55 y/o F with medical history significant for uncontrolled type 2 diabetes and major depressive disorder who presents with symptoms of vertigo. Patient reports that she woke up around 3 am to urinate and noted that the room was spinning around her. She reports she had to crawl on the floor to avoid falling over. Symptoms have been constant during this time and has had associated nausea and vomiting during this time as well. Dizziness worsens with head movement or changing positions. She reports she has never had symptoms similar to this before. Denies any fevers, chills, hematemesis, abdominal pain, diarrhea, constipation, recent illness, ear fullness, tinnitus, weakness, numbness or tingling.   In the ED VSS. Her BMET was significant for a glucose of 552 with a normal anion gap. Her CBC w/diff was wnl. She had an MRI brain which showed no acute intracranial abnormality and was unremarkable.   Meds:  Current Meds  Medication Sig  . venlafaxine (EFFEXOR) 75 MG tablet Take 75 mg by mouth 2 (two) times daily.   Allergies: Allergies as of 04/09/2016 - Review Complete 04/09/2016  Allergen Reaction Noted  . Tramadol Nausea And Vomiting 12/07/2011   Past Medical History:  Diagnosis Date  . Chest pain   . Chronic low back pain    HNP  . Depression dx 1997  . Diabetes mellitus, type 2 ([place]) 2011   No insulin  . GERD (gastroesophageal  reflux disease)   . Glaucoma   . Headache(784.0)   . Herniated disc   . Hypertension    Lab 11/2011:  CXR, EKG, CBC, TSH, BMet, troponin-normal; lipid profile: 188, 131, 36, 126   . Pancreatic cyst    Endoscopic aspiration in 09/2009  . Pneumonia 08/02/2012  . Shingles    Family History:  Mother: Diabetes, Depression Father: unknown  Social History:  Reports quitting smoking tobacco in 1980, denies any drug or alcohol use.   Review of Systems: A complete ROS was negative except as per HPI.   Physical Exam: Blood pressure 128/77, pulse 101, temperature 98.3 F (36.8 C), temperature source Oral, resp. rate 18, height 5\' 6"  (1.676 m), weight 154 lb (69.9 kg), SpO2 93 %. General: Alert, awake caucasian female resting comfortably in bed. Laying still. Does not move head when talking. Had considerable vertigo with Epley maneuver. HENT: Normocephalic and atraumatic. PERRLA. EOMI without nystagmus.  Cardiovascular: RRR, no murmur, gallop or rub appreciated Abdomen: Soft, non-tender and non-distended. No guarding or rigidity.  Extremities: No edema, erythema or suspicious lesions noted.  Neuro: Strength and sensation grossly intact. Facial muscles symmetric. Symmetric rise of uvula. Intact finger-to-nose testing  EKG:   EKG Interpretation  Date/Time:  Thursday April 09 2016 05:21:48 EDT Ventricular Rate:  84 PR Interval:    QRS Duration:  88 QT Interval:  393 QTC Calculation: 465 R Axis:   84 Text Interpretation:  Sinus rhythm No significant change since last tracing Confirmed by Glynn Octave [phone]) on 04/09/2016 6:52:29 AM      Brain MRI: No acute intracranial abnormality. Unremarkable.   Assessment & Plan by Problem: Active Problems:   Diabetes type 2, uncontrolled (HCC)   Depression   Vertigo  Vertigo, Likely BPPV No nystagmus noted on our examination however per ED physician patient had horizontal nystagmus on presentation. She had a normal brain MRI to  evaluate for a possible posterior stroke which was without any acute abnormality. She was given Valium and Meclizine in the ED without improvement of her vertigo. Epley maneuver attempted in the ED without significant improvement and patient experienced considerable nausea during this episode. -Vestibular PT -Meclizine PRN, Diazepam PRN, Zofran PRN -Ordering HIV and HCV  Type 2 Diabetes, Uncontrolled Blood glucose in ED 500. Received 10 units of Novolog with improvement to 200's. HbA1c ordered. Last HbA1c was 11.6% in 12/16. Patient reports she hasn't seen her PCP in over a year and that her diabetes is managed by diet alone. ASCVD 10 year risk from that time was 5.6% -needs to be on moderate vs high intensity statin.  -Will check CMET tomorrow morning and consider starting statin therapy. Getting lipid panel  -Initial glucose in the 500's with a gap of 15. Patient has received 3L of NS in the ED and will recheck gap tomorrow.  -UA without keytones.  -ABG without acidosis. -ISS -Diabetic coordinator recommends Metformin 1000 mg BID in addition to ISS  Depression On Effexor 225 mg daily. Reports stable depression   IVF: NSL DVT Prophylaxis: Lovenox Diet: HH/carb modified  Code Status: Full code  Dispo: Admit patient to Observation with expected length of stay less than 2 midnights.  SignedEinar Gip, DO 04/09/2016, 4:12 PM  Pager: [phone]

## 2016-04-09 NOTE — ED Notes (Signed)
CRITICAL VALUE ALERT  Critical value received:  Glucose 552

## 2016-04-09 NOTE — ED Notes (Signed)
Admitting at bedside 

## 2016-04-09 NOTE — ED Notes (Signed)
cbg was 307

## 2016-04-09 NOTE — ED Notes (Signed)
cbg was 267

## 2016-04-09 NOTE — ED Provider Notes (Signed)
[facility] DEPT Provider Note   CSN: [id number] Arrival date & time: 04/09/16  0510     History   Chief Complaint Chief Complaint  Patient presents with  . Hyperglycemia  . Emesis  . Dizziness    HPI [name] [name] is a 55 y.o. female.  Patient is a 55 year old female with history of hypertension and diabetes who presents the ED with complaint of dizziness. Patient states she has had dizziness that started last night which she describes as "the room spinning". Patient states her dizziness is worsened with movement of her head or change in position. Endorses associated nausea and vomiting. Denies fever, chills, headache, nasal congestion, rhinorrhea, sore throat, cough, shortness of breath, chest pain, wheezing, abdominal pain, hematemesis, diarrhea, urinary symptoms, numbness, tingling, weakness. Patient denies taking any medications for her symptoms. Pt also reports that she is insulin resistant and notes she has not seen her PCP in over a year. She denies being on any medications currently for her diabetes.        Past Medical History:  Diagnosis Date  . Chest pain   . Chronic low back pain    HNP  . Depression dx 1997  . Diabetes mellitus, type 2 ([place]) 2011   No insulin  . GERD (gastroesophageal reflux disease)   . Glaucoma   . Headache(784.0)   . Herniated disc   . Hypertension    Lab 11/2011:  CXR, EKG, CBC, TSH, BMet, troponin-normal; lipid profile: 188, 131, 36, 126   . Pancreatic cyst    Endoscopic aspiration in 09/2009  . Pneumonia 08/02/2012  . Shingles     Patient Active Problem List   Diagnosis Date Noted  . Vertigo 04/09/2016  . Acute sinusitis 06/27/2015  . Cervical disc disorder with radiculopathy of cervical region 02/20/2015  . Neck muscle spasm 10/23/2014  . Healthcare maintenance 10/23/2014  . Depression 11/25/2013  . Diabetes type 2, uncontrolled ([place])     Past Surgical History:  Procedure Laterality Date  . ABDOMINAL HYSTERECTOMY    .  CESAREAN SECTION      OB History    No data available       Home Medications    Prior to Admission medications   Medication Sig Start Date End Date Taking? Authorizing Provider  venlafaxine (EFFEXOR) 75 MG tablet Take 75 mg by mouth 2 (two) times daily.   Yes Historical Provider, MD  acetaminophen-codeine (TYLENOL #3) 300-30 MG per tablet Take 1 tablet by mouth every 8 (eight) hours as needed for moderate pain. Patient not taking: Reported on 04/09/2016 02/20/15   Boykin Nearing, MD  amoxicillin (AMOXIL) 500 MG capsule Take 1 capsule (500 mg total) by mouth 3 (three) times daily. Patient not taking: Reported on 04/09/2016 06/27/15   Boykin Nearing, MD  Blood Glucose Monitoring Suppl (TRUE METRIX METER) DEVI 1 kit by Does not apply route as directed. Use as directed by physician 03/22/15   Boykin Nearing, MD  cetirizine (ZYRTEC) 10 MG tablet Take 1 tablet (10 mg total) by mouth daily. Patient not taking: Reported on 04/09/2016 06/27/15   Boykin Nearing, MD  gabapentin (NEURONTIN) 300 MG capsule Take 1 capsule (300 mg total) by mouth 3 (three) times daily. Patient not taking: Reported on 04/09/2016 06/27/15   Boykin Nearing, MD  glucose blood (TRUE METRIX BLOOD GLUCOSE TEST) test strip 1 each by Other route 3 (three) times daily. Use as instructed 06/27/15   Boykin Nearing, MD  ibuprofen (ADVIL,MOTRIN) 600 MG tablet Take 1 tablet (600  mg total) by mouth every 8 (eight) hours as needed. Patient not taking: Reported on 04/09/2016 02/20/15   Boykin Nearing, MD  metFORMIN (GLUCOPHAGE) 1000 MG tablet Take 1 tablet (1,000 mg total) by mouth 2 (two) times daily with a meal. Patient not taking: Reported on 04/09/2016 06/27/15   Boykin Nearing, MD  sitaGLIPtin (JANUVIA) 100 MG tablet Take 1 tablet (100 mg total) by mouth daily. Patient not taking: Reported on 04/09/2016 06/27/15   Boykin Nearing, MD  TRUEPLUS LANCETS 28G MISC Use as directed 03/22/15   Boykin Nearing, MD    Family History Family History    Problem Relation Age of Onset  . Depression Mother     type 1    Social History Social History  Substance Use Topics  . Smoking status: Former Smoker    Quit date: 08/03/1978  . Smokeless tobacco: Never Used  . Alcohol use No     Comment: Former     Allergies   Tramadol   Review of Systems Review of Systems  Gastrointestinal: Positive for nausea and vomiting.  Neurological: Positive for dizziness.  All other systems reviewed and are negative.    Physical Exam Updated Vital Signs BP 128/77   Pulse 101   Temp 98.3 F (36.8 C) (Oral)   Resp 18   Ht 5' 6" (1.676 m)   Wt 69.9 kg   SpO2 93%   BMI 24.86 kg/m   Physical Exam  Constitutional: She is oriented to person, place, and time. She appears well-developed and well-nourished. No distress.  HENT:  Head: Normocephalic and atraumatic.  Mouth/Throat: Oropharynx is clear and moist. No oropharyngeal exudate.  Eyes: Conjunctivae and EOM are normal. Pupils are equal, round, and reactive to light. Right eye exhibits no discharge. Left eye exhibits no discharge. No scleral icterus.  Horizontal nystagmus noted with rightward gaze  Neck: Normal range of motion. Neck supple.  Cardiovascular: Normal rate, regular rhythm, normal heart sounds and intact distal pulses.   Pulmonary/Chest: Effort normal and breath sounds normal. No respiratory distress. She has no wheezes. She has no rales. She exhibits no tenderness.  Abdominal: Soft. Bowel sounds are normal. She exhibits no distension and no mass. There is no tenderness. There is no rebound and no guarding. No hernia.  Musculoskeletal: Normal range of motion. She exhibits no edema.  Neurological: She is alert and oriented to person, place, and time. She has normal strength. No cranial nerve deficit or sensory deficit. Coordination normal.  Skin: Skin is warm and dry. She is not diaphoretic.  Nursing note and vitals reviewed.    ED Treatments / Results  Labs (all labs ordered  are listed, but only abnormal results are displayed) Labs Reviewed  BASIC METABOLIC PANEL - Abnormal; Notable for the following:       Result Value   Chloride 98 (*)    Glucose, Bld 552 (*)    All other components within normal limits  CBG MONITORING, ED - Abnormal; Notable for the following:    Glucose-Capillary 541 (*)    All other components within normal limits  I-STAT VENOUS BLOOD GAS, ED - Abnormal; Notable for the following:    pH, Ven 7.473 (*)    pCO2, Ven 32.6 (*)    All other components within normal limits  CBG MONITORING, ED - Abnormal; Notable for the following:    Glucose-Capillary 307 (*)    All other components within normal limits  CBG MONITORING, ED - Abnormal; Notable for the following:  Glucose-Capillary 409 (*)    All other components within normal limits  CBG MONITORING, ED - Abnormal; Notable for the following:    Glucose-Capillary 267 (*)    All other components within normal limits  CBC WITH DIFFERENTIAL/PLATELET  URINALYSIS, ROUTINE W REFLEX MICROSCOPIC (NOT AT [facility])    EKG  EKG Interpretation  Date/Time:  Thursday April 09 2016 05:21:48 EDT Ventricular Rate:  84 PR Interval:    QRS Duration: 88 QT Interval:  393 QTC Calculation: 465 R Axis:   84 Text Interpretation:  Sinus rhythm No significant change since last tracing Confirmed by Glynn Octave [phone]) on 04/09/2016 6:52:29 AM       Radiology Mr Brain Wo Contrast  Result Date: 04/09/2016 CLINICAL DATA:  55 year old female with nausea vomiting hyperglycemia dizziness horizontal nystagmus. Initial encounter. EXAM: MRI HEAD WITHOUT CONTRAST TECHNIQUE: Multiplanar, multiecho pulse sequences of the brain and surrounding structures were obtained without intravenous contrast. COMPARISON:  Cervical spine MRI 02/27/2015.  Head CT 12/24/2004. FINDINGS: Brain: No restricted diffusion to suggest acute infarction. No midline shift, mass effect, evidence of mass lesion, ventriculomegaly,  extra-axial collection or acute intracranial hemorrhage. Cervicomedullary junction and pituitary are within normal limits. Pearline Cables and white matter signal is largely normal for age throughout the brain; occasional small nonspecific cerebral white matter T2 and FLAIR hyperintense foci. No cortical encephalomalacia. There may be a chronic micro hemorrhage in the right centrum semi of all on series 9, image 67. No other evidence of chronic cerebral blood products. Deep gray matter nuclei, brainstem, and cerebellum are normal. Vascular: Major intracranial vascular flow voids appear normal. Skull and upper cervical spine: Negative. Visualized bone marrow signal is within normal limits. Sinuses/Orbits: Orbits soft tissues appear normal. Moderate left maxillary sinus mucosal thickening. Trace paranasal sinus mucosal thickening otherwise. Other: Visible internal auditory structures appear normal. Minimal inferior left mastoid air cell fluid appears unchanged since 2006 and mastoids otherwise are clear. Negative scalp soft tissues. IMPRESSION: 1.  No acute intracranial abnormality. 2. Largely unremarkable for age noncontrast MRI appearance of the brain; possible solitary chronic microhemorrhage in the right frontal lobe white matter and occasional other nonspecific white matter signal changes. 3. Moderate left maxillary sinus inflammation. Electronically Signed   By: Genevie Ann M.D.   On: 04/09/2016 14:47    Procedures Procedures (including critical care time)  Medications Ordered in ED Medications  sodium chloride 0.9 % bolus 2,000 mL (0 mLs Intravenous Stopped 04/09/16 0712)  insulin aspart (novoLOG) injection 10 Units (10 Units Intravenous Given 04/09/16 0653)  diazepam (VALIUM) tablet 5 mg (5 mg Oral Given 04/09/16 0653)  meclizine (ANTIVERT) tablet 25 mg (25 mg Oral Given 04/09/16 0653)  ondansetron (ZOFRAN) injection 4 mg (4 mg Intravenous Given 04/09/16 0653)  sodium chloride 0.9 % bolus 1,000 mL (0 mLs Intravenous  Stopped 04/09/16 1020)  ibuprofen (ADVIL,MOTRIN) tablet 600 mg (600 mg Oral Given 04/09/16 1242)  diazepam (VALIUM) tablet 5 mg (5 mg Oral Given 04/09/16 1456)     Initial Impression / Assessment and Plan / ED Course  I have reviewed the triage vital signs and the nursing notes.  Pertinent labs & imaging results that were available during my care of the patient were reviewed by me and considered in my medical decision making (see chart for details).  Clinical Course   Pt presents with dizziness, N/V that started yesterday. EMS reported CBG 493. Hx of DM, denies currently being on any medications. VSS. Exam revealed horizontal nystagmus with rightward gaze, no other  neuro deficits noted. No abdominal tenderness. Remaining exam unremarkable. Pt given IVF, zofran, meclizine and valium.   EKG showed sinus rhythm with no significant change from prior. Glucose 552, no AG. Remaining labs unremarkable. Pt given 2L IVF and 10U insulin. On reevaluation pt reports her nausea has improved and notes her dizziness is resolved as long as she is laying still in bed. Tech reports that pt was unable to ambulate to use the restroom due to worsening dizziness. Pt denies hx of vertigo. Due to no improvement of dizziness and pt without hx of vertigo will order MRI for further evaluation.   CBG improved to 267 s/p IVF and insulin. MRI unremarkable. On reevaluation pt continued to have persistent vertigo, unchanged s/p valium and meclizine. Pt unable to sit up in bed due to worsening dizziness. Pt still unable to stand and ambulate. Will plan to admit pt due to intractable vertigo. Discussed results and plan for admission with pt.   Final Clinical Impressions(s) / ED Diagnoses   Final diagnoses:  Vertigo  Hyperglycemia    New Prescriptions New Prescriptions   No medications on file     Nona Dell, PA-C 04/09/16 [place], MD 04/10/16 [phone]

## 2016-04-09 NOTE — ED Notes (Signed)
Report given to unit by Ronalee Belts, ED RN.

## 2016-04-09 NOTE — ED Notes (Signed)
Pt transported back from MRI, reports continued dizziness with moving head.  Pt reports relief from back pain, reports pain is 3/10.

## 2016-04-09 NOTE — ED Triage Notes (Signed)
Pt brought to ED by P[facility] for c/o nausea, vomiting and high blood sugar, pt states she is diabetic with diet control, CBG 493 on PTAR arrival, BP 124/74, HR 98, R-16, SPO2 100% on RA.

## 2016-04-09 NOTE — ED Notes (Signed)
Pt wanted to ambulate to restroom, when pt sat up, she immediately fell back into the bed complaining of being dizzy.

## 2016-04-10 LAB — BASIC METABOLIC PANEL
Anion gap: 7 (ref 5–15)
BUN: 6 mg/dL (ref 6–20)
CO2: 24 mmol/L (ref 22–32)
Calcium: 8.3 mg/dL — ABNORMAL LOW (ref 8.9–10.3)
Chloride: 103 mmol/L (ref 101–111)
Creatinine, Ser: 0.57 mg/dL (ref 0.44–1.00)
GFR calc Af Amer: >60 mL/min (ref >60)
GFR calc non Af Amer: >60 mL/min (ref >60)
Glucose, Bld: 262 mg/dL — ABNORMAL HIGH (ref 65–99)
Potassium: 4 mmol/L (ref 3.5–5.1)
Sodium: 134 mmol/L — ABNORMAL LOW (ref 135–145)

## 2016-04-10 LAB — GLUCOSE, CAPILLARY
Glucose-Capillary: 264 mg/dL — ABNORMAL HIGH (ref 65–99)
Glucose-Capillary: 408 mg/dL — ABNORMAL HIGH (ref 65–99)
Glucose-Capillary: 209 mg/dL — ABNORMAL HIGH (ref 65–99)
Glucose-Capillary: 259 mg/dL — ABNORMAL HIGH (ref 65–99)

## 2016-04-10 LAB — LIPID PANEL
Cholesterol: 189 mg/dL (ref 0–200)
HDL: 25 mg/dL — ABNORMAL LOW (ref >40)
LDL Cholesterol: 99 mg/dL (ref 0–99)
Total CHOL/HDL Ratio: 7.6 RATIO
Triglycerides: 326 mg/dL — ABNORMAL HIGH (ref <150)
VLDL: 65 mg/dL — ABNORMAL HIGH (ref 0–40)

## 2016-04-10 LAB — HIV ANTIBODY (ROUTINE TESTING W REFLEX): HIV Screen 4th Generation wRfx: NONREACTIVE

## 2016-04-10 MED ORDER — INSULIN STARTER KIT- SYRINGES (ENGLISH)
1.0000 | Freq: Once | Status: DC
  Filled 2016-04-10: qty 1

## 2016-04-10 MED ORDER — INSULIN ASPART 100 UNIT/ML ~~LOC~~ SOLN: 0.0000 [IU] | Freq: Every day | SUBCUTANEOUS | Status: DC

## 2016-04-10 MED ORDER — INSULIN GLARGINE 100 UNIT/ML ~~LOC~~ SOLN
7.0000 [IU] | Freq: Two times a day (BID) | SUBCUTANEOUS | Status: DC
  Administered 2016-04-10 (×2): 7 [IU] via SUBCUTANEOUS
  Filled 2016-04-10 (×4): qty 0.07

## 2016-04-10 MED ORDER — LIVING WELL WITH DIABETES BOOK
Freq: Once | Status: DC
  Filled 2016-04-10: qty 1

## 2016-04-10 MED ORDER — INSULIN GLARGINE 100 UNIT/ML ~~LOC~~ SOLN
7.0000 [IU] | Freq: Every day | SUBCUTANEOUS | Status: DC
  Filled 2016-04-10: qty 0.07

## 2016-04-10 MED ORDER — MECLIZINE HCL 25 MG PO TABS
50.0000 mg | ORAL_TABLET | Freq: Two times a day (BID) | ORAL | Status: DC
  Administered 2016-04-10: 50 mg via ORAL
  Filled 2016-04-10: qty 2

## 2016-04-10 MED ORDER — IBUPROFEN 600 MG PO TABS
600.0000 mg | ORAL_TABLET | Freq: Once | ORAL | Status: AC
  Administered 2016-04-10: 600 mg via ORAL
  Filled 2016-04-10: qty 1

## 2016-04-10 MED ORDER — ATORVASTATIN CALCIUM 10 MG PO TABS
10.0000 mg | ORAL_TABLET | Freq: Every day | ORAL | Status: DC
  Administered 2016-04-10 – 2016-04-11 (×2): 10 mg via ORAL
  Filled 2016-04-10 (×2): qty 1

## 2016-04-10 MED ORDER — INSULIN ASPART 100 UNIT/ML ~~LOC~~ SOLN
0.0000 [IU] | Freq: Three times a day (TID) | SUBCUTANEOUS | Status: DC
  Administered 2016-04-10: 5 [IU] via SUBCUTANEOUS
  Administered 2016-04-11: 8 [IU] via SUBCUTANEOUS
  Administered 2016-04-11: 5 [IU] via SUBCUTANEOUS
  Administered 2016-04-11: 15 [IU] via SUBCUTANEOUS

## 2016-04-10 NOTE — Progress Notes (Addendum)
Inpatient Diabetes Program Recommendations  AACE/ADA: New Consensus Statement on Inpatient Glycemic Control (2015)  Target Ranges:  Prepandial:   less than 140 mg/dL      Peak postprandial:   less than 180 mg/dL (1-2 hours)      Critically ill patients:  140 - 180 mg/dL   Lab Results  Component Value Date   GLUCAP 223 (H) 04/09/2016   HGBA1C 11.60 06/27/2015    Review of Glycemic Control  Results for OLGA, BAPST (MRN [id number]) as of 04/10/2016 08:03  Ref. Range 04/09/2016 05:15 04/09/2016 06:53 04/09/2016 08:29 04/09/2016 11:40 04/09/2016 20:51  Glucose-Capillary Latest Ref Range: 65 - 99 mg/dL 541 (HH) 409 (H) 307 (H) 267 (H) 223 (H)    Diabetes history: Type 2 Outpatient Diabetes medications: Januvia 100mg  qday, Metformin 1000mg  bid Current orders for Inpatient glycemic control: Novolog 0-9 tid  Inpatient Diabetes Program Recommendations: consider adding Lantus 7 units qday starting now, Novolog 0-5 units qhs.   Gentry Fitz, RN, BA, MHA, CDE Diabetes Coordinator Inpatient Diabetes Program  [phone] (Team Pager) [phone] ([place]) 04/10/2016 8:28 AM

## 2016-04-10 NOTE — Evaluation (Signed)
Physical Therapy Evaluation Patient Details Name: [name] [name] MRN: [id number] DOB: 13-Jan-1961 Today's Date: 04/10/2016   History of Present Illness  [name] [name] is a 55yo F who has history of poorly controlled diabetes, depression who was admitted for symptoms of vertigo causing significant N/V/HA.  Clinical Impression  Patient presents with decreased independence with mobility due to symptoms of dizziness (heavy headedness,) headache and nausea.  Feel she will benefit from skilled PT during acute stay to allow d/c home with family support and follow up HHPT.  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  Will follow up tomorrow for further testing and treatment as tolerated.  I spoke with her about having daughter and/or others in to assist temporarily.     Follow Up Recommendations Home health PT (for vestibular rehab)    Equipment Recommendations  Rolling walker with 5" wheels    Recommendations for Other Services       Precautions / Restrictions Precautions Precautions: Fall      Mobility  Bed Mobility Overal bed mobility: Needs Assistance Bed Mobility: Rolling;Sidelying to Sit;Sit to Supine Rolling: Supervision Sidelying to sit: Min assist   Sit to supine: Supervision   General bed mobility comments: assist for head when coming upright due to head pain and "heavy"  Transfers Overall transfer level: Needs assistance Equipment used: 1 person hand held assist Transfers: Sit to/from Stand Sit to Stand: Min assist         General transfer comment: increased time, cues for compensatory technique to improve symptoms/balance  Ambulation/Gait Ambulation/Gait assistance: Min assist;Mod assist Ambulation Distance (Feet): 12 Feet (x 2) Assistive device: 1 person hand held assist Gait  Pattern/deviations: Decreased stride length;Trunk flexed;Wide base of support     General Gait Details: increased time poor tolerance to upright due to HA, heavy head symptoms; assist on one side and pt reaching to sink, wall and grabbars in bathroom  Stairs            Wheelchair Mobility    Modified Rankin (Stroke Patients Only)       Balance Overall balance assessment: Needs assistance Sitting-balance support: Bilateral upper extremity supported Sitting balance-Leahy Scale: Poor Sitting balance - Comments: off balance feeling and sitting with UE support, leans posterior when eyes closed to recover from symptoms   Standing balance support: Bilateral upper extremity supported Standing balance-Leahy Scale: Poor Standing balance comment: heavy UE support for ambulating in room                             Pertinent Vitals/Pain Pain Assessment: 0-10 Pain Score: 7  Pain Location: headache Pain Descriptors / Indicators: Headache Pain Intervention(s): RN gave pain meds during session;Monitored during session;Limited activity within patient's tolerance;Repositioned    Home Living Family/patient expects to be discharged to:: Private residence Living Arrangements: Alone Available Help at Discharge: Family;Available PRN/intermittently (daughter) Type of Home: Apartment Home Access: Level entry     Home Layout: One level Home Equipment: None      Prior Function Level of Independence: Independent         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     Hand Dominance        Extremity/Trunk Assessment   Upper Extremity Assessment: Overall WFL for tasks assessed  Lower Extremity Assessment: Overall WFL for tasks assessed         Communication   Communication: No difficulties  Cognition Arousal/Alertness: Awake/alert Behavior During Therapy: WFL for tasks assessed/performed Overall Cognitive Status: Within Functional Limits for tasks  assessed                      General Comments General comments (skin integrity, edema, etc.): Seated oculomotor assessment WNL, but with difficulty due to increasing symptoms; did note 3 beats nystagmus with R gaze holding, attempting VOR, but pt only able to perform with very slow head movements and relates increased symptoms with head motion.  Unable to complete positional testing and further vestibular testing due to pain, fatigue and symptoms.   Educated pt on likely has L vestibular hypofunction and that she needs to continue to get up and move with assist despite symptoms to allow brain to accomodate to hypofunction.  Also discussed needs testing for positional vertigo due to worse symptoms with head back (like when taking pills during session,) but pt unable to tolerate at this time.      Exercises     Assessment/Plan    PT Assessment Patient needs continued PT services  PT Problem List Decreased activity tolerance;Decreased balance;Decreased mobility;Decreased safety awareness;Decreased knowledge of use of DME;Pain;Other (comment) (dizziness)          PT Treatment Interventions DME instruction;Gait training;Functional mobility training;Balance training;Therapeutic exercise;Therapeutic activities;Patient/family education;Other (comment) (vestibular rehab, canalith repositioning. )    PT Goals (Current goals can be found in the Care Plan section)  Acute Rehab PT Goals Patient Stated Goal: To return to independent PT Goal Formulation: With patient Time For Goal Achievement: 04/17/16 Potential to Achieve Goals: Good    Frequency Min 3X/week   Barriers to discharge        Co-evaluation               End of Session Equipment Utilized During Treatment: Gait belt Activity Tolerance: Patient limited by fatigue;Patient limited by pain Patient left: in bed;with call bell/phone within reach;with bed alarm set      Functional Assessment Tool Used: Clinical  Judgement Functional Limitation: Mobility: Walking and moving around Mobility: Walking and Moving Around Current Status [id number]): At least 20 percent but less than 40 percent impaired, limited or restricted Mobility: Walking and Moving Around Goal Status [phone]): At least 1 percent but less than 20 percent impaired, limited or restricted    Time: 1123-1155 PT Time Calculation (min) (ACUTE ONLY): 32 min   Charges:   PT Evaluation $PT Eval Moderate Complexity: 1 Procedure PT Treatments $Gait Training: 8-22 mins   PT G Codes:   PT G-Codes **NOT FOR INPATIENT CLASS** Functional Assessment Tool Used: Clinical Judgement Functional Limitation: Mobility: Walking and moving around Mobility: Walking and Moving Around Current Status [id number]): At least 20 percent but less than 40 percent impaired, limited or restricted Mobility: Walking and Moving Around Goal Status [phone]): At least 1 percent but less than 20 percent impaired, limited or restricted    Reginia Naas 04/10/2016, 4:47 PM  Magda Kiel, [place] 04/10/2016

## 2016-04-10 NOTE — Progress Notes (Addendum)
Inpatient Diabetes Program Recommendations  AACE/ADA: New Consensus Statement on Inpatient Glycemic Control (2015)  Target Ranges:  Prepandial:   less than 140 mg/dL      Peak postprandial:   less than 180 mg/dL (1-2 hours)      Critically ill patients:  140 - 180 mg/dL   Lab Results  Component Value Date   GLUCAP 264 (H) 04/10/2016   HGBA1C 11.60 06/27/2015    Met with patient at the bedside.  Patient reports that she was being seen by the [facility] in the past but her orange card expired and she never went to renew it. She tells me she has taken insulin in the past and was taken off it because her MD said "it wasn't working and she had insulin resistance".  She was using Januvia and Metformin but was taken off of Metformin because she was walking consistently and eating "like I was supposed to".   Informed patient that we will be sending patient home on insulin and Metformin- she was receptive to the information. She has used both insulin pens and insulin syringes in the past.  Patient was rotating her insulin in her abdomen and was storing insulin in the refrigerator.   Patient tells me she had plenty of strips at home- I have asked her to throw out all the expired strips and begin checking blood sugars twice a day.  Please have patient watch patient education videos on diabetes: 501-Basic skills for controlling diabetes; 343-HWYSHUOHFG Long Term Complications; 902-XJDBZMCE Your Diabetes; 504-Introduction to Carb Counting; 505-Diabetes: Foot Care; 506-Diabetes: Insulin Injection; 507-Diabetes & Nutrition: Eating for Health; 508-Diabetes: Reducing Fears About Injections; 509-Diabetes and Heart Disease; 510-Monitoring Blood Glucose  Noon time CBG elevated - consider adding Novolog 5 units tid with meals while inpatient.  Since this patient had a significant elevation in blood sugar after eating breakfast this morning, for discharge consider [employer] Reli-on 70/30  insulin 10 units bid (pre-breakfast and pre-supper), Metformin 1000 mg bid  Spoke to patient by phone - explained the difference between NPH and 70/30 insulin and my recommendations for 70/30 insulin at discharge since she had a spike in blood sugars after breakfast today.   Stressed the importance of taking 70/30 insulin 15 minutes before breakfast and supper.   Spoke to [employer], caring for patient- asked her to allow patient to give herself her own insulin while inpatient and to have patient watch the diabetes videos.   Encouraged the patient to call the [facility] today and try to schedule a follow up visit.    Gentry Fitz, RN, BA, MHA, CDE Diabetes Coordinator Inpatient Diabetes Program  [phone] (Team Pager) [phone] ([place]) 04/10/2016 1:20 PM

## 2016-04-10 NOTE — Care Management Note (Signed)
Case Management Note  Patient Details  Name: [name] [name] MRN: [id number] Date of Birth: [birth year]  Subjective/Objective:                 Patienty in obs for vertigo from home, follows at [facility], Dr. Adrian Blackwater.    Action/Plan:  No CM needs identified at his time.   Expected Discharge Date:                  Expected Discharge Plan:  Home/Self Care  In-House Referral:  NA  Discharge planning Services  CM Consult, [facility]  Post Acute Care Choice:  NA Choice offered to:     DME Arranged:    DME Agency:     HH Arranged:    HH Agency:     Status of Service:  In process, will continue to follow  If discussed at Long Length of Stay Meetings, dates discussed:    Additional Comments:  Carles Collet, RN 04/10/2016, 1:50 PM

## 2016-04-10 NOTE — Progress Notes (Signed)
   Subjective:  [name] [name] was seen and evaluated today at bedside. She reports she is now able to turn her head without dizziness however still has dizziness with sitting upright. Reports she still feels nauseated however has had no more episodes of vomiting.   Objective:  Vital signs in last 24 hours: Vitals:   04/09/16 1837 04/09/16 2053 04/09/16 2053 04/10/16 0516  BP: 118/69  136/70 128/69  Pulse: 96  72 90  Resp: 12  16 18   Temp:   98.4 F (36.9 C) 98.2 F (36.8 C)  TempSrc:    Oral  SpO2: 96%  99% 98%  Weight:  150 lb 4.8 oz (68.2 kg)    Height:  5' 7.2" (1.707 m)     General: Alert, awake and in no acute distress. Resting comfortably in bed. Cardiovascular: RRR, no murmur, rubs or gallops Pulmonary: CTA BL, no wheezing Abdomen: Normoative bowel sounds. Soft and non-tender. Not distended.  Assessment/Plan:  Active Problems:   Diabetes type 2, uncontrolled (HCC)   Depression   Vertigo  Vertigo, Likely BPPV  No nystagmus noted on examination. She required Meclizine x2 overnight. Reports she is now able to turn her head without dizziness however reports dizziness with sitting up.  -Vestibular PT has been ordered and we appreciate their assistance. Patient would benefit from learning at home maneuvers to assist with vertigo if this should happen again -Meclizine PRN -Zofran PRN  Type 2 Diabetes, Uncontrolled Blood glucose has been ranging from 220-270 during admission however was 550 on presentation to ED. Repeat HbA1c pending however chart review shows poorly controlled diabetes for several years. Pt reports she has not followed up with her PCP at the [place] in over a year and is controlling her diabetes through diet. She has been seen by the diabetes coordinator who recommends Metformin 1000 mg BID, and NPH Insulin 14 units BID+ Novolog 0-5 units QHS.  -Start Metformin 500 mg Q daily -Start NPH insulin BID -ISS -Patient will be educated on  diabetes and insulin use per diabetes coordinator and we appreciate their advice and assistance in this matter -Patient will need to re-establish care with PCP to f/u of her chronic medical issues  Hyperlipidemia Lipid panel significant for TG of  She has a 10 year ASCVD risk at 6.8% based on current laboratory results indicating need for moderate intensity statin therapy -Starting Atorvastatin 10 mg with close follow up to establish with PCP  Depression Effexor 225 mg continued  Dispo: Anticipated discharge TODAY.  Dominie Benedick, DO 04/10/2016, 11:11 AM Pager: [phone]

## 2016-04-11 DIAGNOSIS — E785 Hyperlipidemia, unspecified: Secondary | ICD-10-CM

## 2016-04-11 DIAGNOSIS — F329 Major depressive disorder, single episode, unspecified: Secondary | ICD-10-CM

## 2016-04-11 DIAGNOSIS — Z794 Long term (current) use of insulin: Secondary | ICD-10-CM

## 2016-04-11 DIAGNOSIS — R42 Dizziness and giddiness: Secondary | ICD-10-CM

## 2016-04-11 DIAGNOSIS — E118 Type 2 diabetes mellitus with unspecified complications: Secondary | ICD-10-CM

## 2016-04-11 LAB — GLUCOSE, CAPILLARY
Glucose-Capillary: 234 mg/dL — ABNORMAL HIGH (ref 65–99)
Glucose-Capillary: 337 mg/dL — ABNORMAL HIGH (ref 65–99)
Glucose-Capillary: 296 mg/dL — ABNORMAL HIGH (ref 65–99)

## 2016-04-11 LAB — HEPATITIS C ANTIBODY: HCV Ab: <0.1 s/co ratio (ref 0.0–0.9)

## 2016-04-11 MED ORDER — MECLIZINE HCL 25 MG PO TABS: 50.0000 mg | ORAL_TABLET | Freq: Two times a day (BID) | ORAL | 0 refills | Status: DC | PRN

## 2016-04-11 MED ORDER — INSULIN ASPART PROT & ASPART (70-30 MIX) 100 UNIT/ML ~~LOC~~ SUSP
10.0000 [IU] | Freq: Two times a day (BID) | SUBCUTANEOUS | Status: DC
  Administered 2016-04-11 (×2): 10 [IU] via SUBCUTANEOUS
  Filled 2016-04-11: qty 10

## 2016-04-11 MED ORDER — METFORMIN HCL 500 MG PO TABS
500.0000 mg | ORAL_TABLET | Freq: Two times a day (BID) | ORAL | Status: DC
  Administered 2016-04-11 (×2): 500 mg via ORAL
  Filled 2016-04-11 (×2): qty 1

## 2016-04-11 MED ORDER — IBUPROFEN 600 MG PO TABS: 600.0000 mg | ORAL_TABLET | ORAL | Status: DC | PRN

## 2016-04-11 MED ORDER — PNEUMOCOCCAL VAC POLYVALENT 25 MCG/0.5ML IJ INJ
0.5000 mL | INJECTION | INTRAMUSCULAR | Status: AC
  Administered 2016-04-11: 0.5 mL via INTRAMUSCULAR
  Filled 2016-04-11: qty 0.5

## 2016-04-11 MED ORDER — ONDANSETRON HCL 4 MG/2ML IJ SOLN
4.0000 mg | Freq: Four times a day (QID) | INTRAMUSCULAR | Status: DC | PRN
Start: 2016-04-11 — End: 2016-04-11

## 2016-04-11 MED ORDER — METFORMIN HCL 1000 MG PO TABS: ORAL_TABLET | ORAL | 3 refills | Status: DC

## 2016-04-11 MED ORDER — ATORVASTATIN CALCIUM 10 MG PO TABS: 10.0000 mg | ORAL_TABLET | Freq: Every day | ORAL | 3 refills | Status: DC

## 2016-04-11 MED ORDER — GABAPENTIN 300 MG PO CAPS
300.0000 mg | ORAL_CAPSULE | Freq: Three times a day (TID) | ORAL | 3 refills | Status: DC
Start: 2016-04-11 — End: 2017-02-18

## 2016-04-11 MED ORDER — INSULIN ASPART PROT & ASPART (70-30 MIX) 100 UNIT/ML ~~LOC~~ SUSP: 10.0000 [IU] | Freq: Two times a day (BID) | SUBCUTANEOUS | 11 refills | Status: DC

## 2016-04-11 MED ORDER — MECLIZINE HCL 25 MG PO TABS
50.0000 mg | ORAL_TABLET | Freq: Two times a day (BID) | ORAL | 0 refills | Status: DC | PRN
Start: 2016-04-11 — End: 2016-04-11

## 2016-04-11 MED ORDER — INFLUENZA VAC SPLIT QUAD 0.5 ML IM SUSY
0.5000 mL | PREFILLED_SYRINGE | INTRAMUSCULAR | Status: AC
  Administered 2016-04-11: 0.5 mL via INTRAMUSCULAR

## 2016-04-11 MED ORDER — MECLIZINE HCL 25 MG PO TABS: 50.0000 mg | ORAL_TABLET | Freq: Two times a day (BID) | ORAL | Status: DC | PRN

## 2016-04-11 NOTE — Progress Notes (Signed)
   Subjective:  Pt worked with PT yesterday and feels better after canalith repositioning. Dizziness has resolved but pt developed a left sided headache afterwards. She usually takes ibuprofen at home for HAs. Reports she has used insulin in the past and knows how to inject herself  Objective:  Vital signs in last 24 hours: Vitals:   04/10/16 0516 04/10/16 1411 04/10/16 2137 04/11/16 0620  BP: 128/69 119/64 116/66 114/69  Pulse: 90 (!) 110 87 85  Resp: 18 15 20 20   Temp: 98.2 F (36.8 C) 98.7 F (37.1 C) 98.6 F (37 C) 98.1 F (36.7 C)  TempSrc: Oral Oral Oral Oral  SpO2: 98% 98% 94% 97%  Weight:      Height:       General: Alert, awake and in no acute distress. Resting comfortably in bed. Able to sit upright in bed for lung auscultation without vertigo  Cardiovascular: RRR, no murmur, rubs or gallops Pulmonary: CTA BL, no wheezing Abdomen: Normoative bowel sounds. Soft and non-tender. Not distended.\ Ext: SCDs in place  Assessment/Plan:  Active Problems:   Diabetes type 2, uncontrolled (HCC)   Depression   Vertigo  Vertigo, Likely BPPV - dizziness has resolved s/p canalith repositioning - PT following, recommend home health PT for vestibular rehab -Meclizine 50mg  BID prn -Zofran q6h  PRN - ibuprofen 600mg  prn for headache  Type 2 Diabetes, Uncontrolled- a1c pending - DM coordinator following, appreciate recommendations. Will start on 70/30 10 units BID and metformin 500mg  BID. Titrate metformin up to 1000mg  BID as outpatient to avoid GI SE -SSI  Hyperlipidemia Lipid panel significant for TG of  She has a 10 year ASCVD risk at 6.8% based on current laboratory results indicating need for moderate intensity statin therapy -Starting Atorvastatin 10 mg with close follow up to establish with PCP  Depression Effexor 225 mg continued  Dispo: Anticipated discharge TODAY.  Norman Herrlich, MD 04/11/2016, 8:45 AM Pager: [phone]

## 2016-04-11 NOTE — Care Management Note (Signed)
Case Management Note  Patient Details  Name: Bertha Shackett MRN: [id number] Date of Birth: [birth year]  Subjective/Objective:                    Action/Plan:   Expected Discharge Date:                  Expected Discharge Plan:  Home/Self Care  In-House Referral:  NA  Discharge planning Services  CM Consult, [facility]  Post Acute Care Choice:  NA Choice offered to:     DME Arranged:  Walker rolling DME Agency:  [place]:  PT [facility] Agency:  Other - See comment  Status of Service:  Completed, signed off  If discussed at [place] of Stay Meetings, dates discussed:    Additional Comments: CM spoke with pt to explain without insurance, the recc HHPT is an out of pocket expense and is about 150.00/hr.  Pt declines all [place] services as she states she cannot afford the expense.  CM notified Reggie of [place]  to please arrange for a charity rolling walker and deliver to room prior to discharge.  No other CM needs were communicated. Dellie Catholic, RN 04/11/2016, 2:18 PM

## 2016-04-11 NOTE — Progress Notes (Signed)
[name] [name] discharged Home per MD order.  Discharge instructions reviewed and discussed with the patient, all questions and concerns answered. Copy of instructions, care notes for new medications and diagnosis and scripts given to patient.    Medication List    STOP taking these medications   amoxicillin 500 MG capsule Commonly known as:  AMOXIL   sitaGLIPtin 100 MG tablet Commonly known as:  JANUVIA     TAKE these medications   atorvastatin 10 MG tablet Commonly known as:  LIPITOR Take 1 tablet (10 mg total) by mouth daily at 6 PM.   cetirizine 10 MG tablet Commonly known as:  ZYRTEC Take 1 tablet (10 mg total) by mouth daily.   gabapentin 300 MG capsule Commonly known as:  NEURONTIN Take 1 capsule (300 mg total) by mouth 3 (three) times daily.   glucose blood test strip Commonly known as:  TRUE METRIX BLOOD GLUCOSE TEST 1 each by Other route 3 (three) times daily. Use as instructed   insulin aspart protamine- aspart (70-30) 100 UNIT/ML injection Commonly known as:  NOVOLOG MIX 70/30 Inject 0.1 mLs (10 Units total) into the skin 2 (two) times daily with a meal.   meclizine 25 MG tablet Commonly known as:  ANTIVERT Take 2 tablets (50 mg total) by mouth 2 (two) times daily as needed for dizziness.   metFORMIN 1000 MG tablet Commonly known as:  GLUCOPHAGE Take 535m BID x 3 days then increase to 5016mqAM and 100048mPM x 3 days, then take 1000m61mD What changed:  how much to take  how to take this  when to take this  additional instructions   TRUE METRIX METER Devi 1 kit by Does not apply route as directed. Use as directed by physician   TRUEPLUS LANCETS 28G Misc Use as directed   venlafaxine 75 MG tablet Commonly known as:  EFFEXOR Take 75 mg by mouth 2 (two) times daily.       Patients skin is clean, dry and intact, no evidence of skin break down. IV site discontinued and catheter remains intact. Site without signs and symptoms of complications.  Dressing and pressure applied.  Patient escorted to car by NT in a wheelchair,  no distress noted upon discharge.  JohnWynetta Emerylcine C 04/11/2016 9:38 PM

## 2016-04-11 NOTE — Discharge Instructions (Signed)
Start taking 70/30 insulin 10 units twice a day with meals.   Start taking metformin 500mg  twice a day for 3 days, then increase to 500mg  once in the mornings and 1000mg  in the evening for 3 days, then start taking 1000mg  twice a day. This is to minimize gastrointestinal upset.

## 2016-04-11 NOTE — Progress Notes (Signed)
Physical Therapy Treatment Patient Details Name: [name] [name] MRN: [id number] DOB: 07-24-1960 Today's Date: 04/11/2016    History of Present Illness [name] [name] is a 55yo F who has history of poorly controlled diabetes, depression who was admitted for symptoms of vertigo causing significant N/V/HA.    PT Comments    Pt with report "i feel so much better than yesterday." "I don't have a headache anymore and minimal dizziness." Pt tolerated ambulation better today but is walking very guarded and slow and had LOB with head turn to the R. 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 Discussed that patient is steady enough to care for herself let alone care for others. Recommend another night stay for further vestibular assessment. If pt d/c'd home pt will need 24/7 supervision atleast for 24-48 hours. Pt to call daughter to see if possible. Acute PT to follow.  Follow Up Recommendations  Home health PT, 24/7 supervision     Equipment Recommendations  Rolling walker with 5" wheels    Recommendations for Other Services       Precautions / Restrictions Precautions Precautions: Fall Restrictions Weight Bearing Restrictions: No    Mobility  Bed Mobility Overal bed mobility: Modified Independent Bed Mobility: Rolling;Sidelying to Sit;Sit to Supine Rolling: Modified independent (Device/Increase time)         General bed mobility comments: used bed rail at first but able to do without  Transfers Overall transfer level: Needs assistance   Transfers: Sit to/from Stand Sit to Stand: Min guard         General transfer comment: increased time and cautious but no episodes of LOB and no report of dizziness  Ambulation/Gait Ambulation/Gait assistance: Min guard Ambulation Distance (Feet): 50 Feet (x2) Assistive device: None Gait Pattern/deviations: Step-through pattern;Decreased stride length Gait velocity: slow and guarded Gait  velocity interpretation: Below normal speed for age/gender General Gait Details: pt slow and guarded, pt with LOB with head turns to the R.  pt instructed on gaze stabilization (focusing on stable object while walking) Pt reports "my legs feel wobbly" Pt refused use of RW with report "i start work on Monday I can't do that"   Stairs            Wheelchair Mobility    Modified Rankin (Stroke Patients Only)       Balance Overall balance assessment: Needs assistance         Standing balance support: No upper extremity supported Standing balance-Leahy Scale: Fair Standing balance comment: LOB with head turns requiring minA to regain balance                    Cognition Arousal/Alertness: Awake/alert Behavior During Therapy: WFL for tasks assessed/performed Overall Cognitive Status: Within Functional Limits for tasks assessed                      Exercises Other Exercises Other Exercises: gaze stabilization compensatory technique    General Comments General comments (skin integrity, edema, etc.): Vestibular Assessment: Pt tested for posterior and horizontal canal BPPV both L and R. Pt denies dizziness and no nystagmus but reports mild dizziness with return to sittting. Pt treated for L posterior canal BPPV. no nystagus with end range tracking this date      Pertinent Vitals/Pain Pain Assessment: No/denies pain    Home Living  Prior Function            PT Goals (current goals can now be found in the care plan section) Acute Rehab PT Goals Patient Stated Goal: start work at new job on Monday Progress towards PT goals: Progressing toward goals    Frequency    Min 3X/week      PT Plan Current plan remains appropriate    Co-evaluation             End of Session Equipment Utilized During Treatment: Gait belt Activity Tolerance: Patient tolerated treatment well Patient left: in chair;with call bell/phone  within reach     Time: 0930-1005 PT Time Calculation (min) (ACUTE ONLY): 35 min  Charges:  $Gait Training: 8-22 mins $Canalith Rep Proc: 8-22 mins                    G Codes:      Kingsley Callander 04/11/2016, 10:24 AM   Kittie Plater, PT, DPT Pager #: [phone] Office #: [phone]

## 2016-04-13 LAB — HEMOGLOBIN A1C
Hgb A1c MFr Bld: >15.5 % — ABNORMAL HIGH (ref 4.8–5.6)
Mean Plasma Glucose: >398 mg/dL

## 2016-04-13 NOTE — Discharge Summary (Signed)
Name: [name] [name] MRN: [id number] DOB: 1961/03/15 55 y.o. PCP: Boykin Nearing, MD  Date of Admission: 04/09/2016  5:10 AM Date of Discharge: 04/11/2016 Attending Physician: Carlyle Basques, MD  Discharge Diagnosis: 1. Vertigo 2. Type 2 Diabetes, uncontrolled 3. Depression 4. Hyperlipidemia  Active Problems:   Diabetes type 2, uncontrolled (HCC)   Depression   Vertigo   Discharge Medications:   Medication List    STOP taking these medications   amoxicillin 500 MG capsule Commonly known as:  AMOXIL   sitaGLIPtin 100 MG tablet Commonly known as:  JANUVIA     TAKE these medications   atorvastatin 10 MG tablet Commonly known as:  LIPITOR Take 1 tablet (10 mg total) by mouth daily at 6 PM.   cetirizine 10 MG tablet Commonly known as:  ZYRTEC Take 1 tablet (10 mg total) by mouth daily.   gabapentin 300 MG capsule Commonly known as:  NEURONTIN Take 1 capsule (300 mg total) by mouth 3 (three) times daily.   glucose blood test strip Commonly known as:  TRUE METRIX BLOOD GLUCOSE TEST 1 each by Other route 3 (three) times daily. Use as instructed   insulin aspart protamine- aspart (70-30) 100 UNIT/ML injection Commonly known as:  NOVOLOG MIX 70/30 Inject 0.1 mLs (10 Units total) into the skin 2 (two) times daily with a meal.   meclizine 25 MG tablet Commonly known as:  ANTIVERT Take 2 tablets (50 mg total) by mouth 2 (two) times daily as needed for dizziness.   metFORMIN 1000 MG tablet Commonly known as:  GLUCOPHAGE Take 523m BID x 3 days then increase to 5074mqAM and 100064mPM x 3 days, then take 1000m63mD What changed:  how much to take  how to take this  when to take this  additional instructions   TRUE METRIX METER Devi 1 kit by Does not apply route as directed. Use as directed by physician   TRUEPLUS LANCETS 28G Misc Use as directed   venlafaxine 75 MG tablet Commonly known as:  EFFEXOR Take 75 mg by mouth 2 (two) times daily.        Disposition and follow-up:   [name]  At the hospital follow up visit please address:  1.  Has she had any more episodes of vertigo? Is she taking her Meclizine, Atorvastatin, Metformin and Insulin as directed? Does she check her blood sugar at home?  2.  Labs / imaging needed at time of follow-up: none.  3.  Pending labs/ test needing follow-up: none.  Follow-up Appointments: Follow-up Information    [facility]. Schedule an appointment as soon as possible for a visit today.   Specialty:  Family Medicine Contact information: [phone]       Hospital Course by problem list: Active Problems:   Diabetes type 2, uncontrolled (HCC)   Depression   Vertigo   1. Vertigo This is a 55 y71 F who presents 04/09/16 with vertiginous symptoms beginning at 3 am that morning when she woke up to urinate. Pt reports the sensation of the room spinning around her and had to crawl on the floor to avoid falling while going to the bathroom. Pt reports associated nausea and vomiting. Worse with head movement. Patient denied any similar episodes. Denies any ringing in ears, ear fullness, new weakness/numbness, slurred speech or any other symptoms. MRI brain obtained in ED  negative. She was treated with Meclizine and vestibular PT. She did not have significant symptom improvement on hospital day 1 however on hospital day 2 she had new complete resolution of her vertiginous symptoms. She was d/c home with Rx for Meclizine prn.   2. Type 2 Diabetes, uncontrolled Patient reports a long hx of diabetes for which she previously took Insulin and [place]. She reports she currently controls her diabetes with diet alone and hasn't seen her pcp in over a year. Most recent hemoglobin A1c 11.6%. During her hospitalization she was started on Metformin 500 mg BID with ultimate goal of  titration up to 1000 mg BID as tolerated. She was also started on Insulin and received diabetic counseling during her admission. D/c home with Metformin 500 mg BID + Insulin 70/30 10 units BID + instructions [facility]  3. Hyperlipidemia Pt started on Atorvastatin 10 mg daily.   4. Depression Continued Effexor 225 mg daily.   Discharge Vitals:   BP (!) 89/63 (BP Location: Left Arm)   Pulse 99   Temp 97.8 F (36.6 C) (Oral)   Resp 17   Ht 5' 7.2" (1.707 m)   Wt 150 lb 4.8 oz (68.2 kg)   SpO2 95%   BMI 23.40 kg/m   Pertinent Labs, Studies, and Procedures:  MRI Brain: without acute intracranial abnormality CBC, CMP, UA essentially normal. Lipid panel revealed elevated triglycerides.   Discharge Instructions: Discharge Instructions    Call MD for:  persistant dizziness or light-headedness    Complete by:  As directed    Face-to-face encounter (required for Medicare/Medicaid patients)    Complete by:  As directed    I Julious Oka certify that this patient is under my care and that I, or a nurse practitioner or physician's assistant working with me, had a face-to-face encounter that meets the physician face-to-face encounter requirements with this patient on 04/11/2016. 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   The encounter with the patient was in whole, or in part, for the following medical condition, which is the primary reason for home health care:  vertigo, uncontrolled DM   I certify that, based on my findings, the following services are medically necessary home health services:  Physical therapy   Reason for Medically Necessary Home Health Services:  Other See Comments   My clinical findings support the need for the above  services:  Unsafe ambulation due to balance issues   Further, I certify that my clinical findings support that this patient is homebound due to:  Unsafe ambulation due to balance issues   For home use only DME Walker rolling    Complete by:  As directed    Home Health    Complete by:  As directed    To provide the following care/treatments:  PT   Increase activity slowly    Complete by:  As directed       Signed: Mayara Paulson, DO 04/13/2016, 11:35 AM   Pager: [phone]

## 2016-06-23 ENCOUNTER — Emergency Department (HOSPITAL_COMMUNITY)
Admission: EM | Admit: 2016-06-23 | Discharge: 2016-06-24 | Disposition: A | Discharge status: Home / Self Care | Payer: Self-pay | Attending: Emergency Medicine | Admitting: Emergency Medicine

## 2016-06-23 ENCOUNTER — Encounter (HOSPITAL_COMMUNITY): Payer: Self-pay | Admitting: Emergency Medicine

## 2016-06-23 DIAGNOSIS — I10 Essential (primary) hypertension: Secondary | ICD-10-CM | POA: Insufficient documentation

## 2016-06-23 DIAGNOSIS — E1165 Type 2 diabetes mellitus with hyperglycemia: Secondary | ICD-10-CM | POA: Insufficient documentation

## 2016-06-23 DIAGNOSIS — Z87891 Personal history of nicotine dependence: Secondary | ICD-10-CM | POA: Insufficient documentation

## 2016-06-23 DIAGNOSIS — R739 Hyperglycemia, unspecified: Secondary | ICD-10-CM

## 2016-06-23 DIAGNOSIS — Z794 Long term (current) use of insulin: Secondary | ICD-10-CM | POA: Insufficient documentation

## 2016-06-23 DIAGNOSIS — Z760 Encounter for issue of repeat prescription: Secondary | ICD-10-CM | POA: Insufficient documentation

## 2016-06-23 NOTE — ED Triage Notes (Signed)
Pt arrives with c/o sudden onset vomiting, called EMS and noted CBG to read "high." Pt reports hx of diabetes. No meds given by EMS.

## 2016-06-24 LAB — CBC WITH DIFFERENTIAL/PLATELET
Basophils Absolute: 0 10*3/uL (ref 0.0–0.1)
Basophils Relative: 0 %
Eosinophils Absolute: 0.1 10*3/uL (ref 0.0–0.7)
Eosinophils Relative: 2 %
HCT: 39.7 % (ref 36.0–46.0)
Hemoglobin: 14.2 g/dL (ref 12.0–15.0)
Lymphocytes Relative: 41 %
Lymphs Abs: 2.5 10*3/uL (ref 0.7–4.0)
MCH: 32.7 pg (ref 26.0–34.0)
MCHC: 35.8 g/dL (ref 30.0–36.0)
MCV: 91.5 fL (ref 78.0–100.0)
Monocytes Absolute: 0.6 10*3/uL (ref 0.1–1.0)
Monocytes Relative: 10 %
Neutro Abs: 2.9 10*3/uL (ref 1.7–7.7)
Neutrophils Relative %: 47 %
Platelets: 204 10*3/uL (ref 150–400)
RBC: 4.34 MIL/uL (ref 3.87–5.11)
RDW: 12.2 % (ref 11.5–15.5)
WBC: 6.1 10*3/uL (ref 4.0–10.5)

## 2016-06-24 LAB — CBG MONITORING, ED
Glucose-Capillary: 328 mg/dL — ABNORMAL HIGH (ref 65–99)
Glucose-Capillary: 333 mg/dL — ABNORMAL HIGH (ref 65–99)
Glucose-Capillary: 491 mg/dL — ABNORMAL HIGH (ref 65–99)
Glucose-Capillary: >600 mg/dL (ref 65–99)

## 2016-06-24 LAB — BASIC METABOLIC PANEL
Anion gap: 11 (ref 5–15)
BUN: 9 mg/dL (ref 6–20)
CO2: 23 mmol/L (ref 22–32)
Calcium: 9.5 mg/dL (ref 8.9–10.3)
Chloride: 96 mmol/L — ABNORMAL LOW (ref 101–111)
Creatinine, Ser: 0.89 mg/dL (ref 0.44–1.00)
GFR calc Af Amer: >60 mL/min (ref >60)
GFR calc non Af Amer: >60 mL/min (ref >60)
Glucose, Bld: 722 mg/dL (ref 65–99)
Potassium: 4.4 mmol/L (ref 3.5–5.1)
Sodium: 130 mmol/L — ABNORMAL LOW (ref 135–145)

## 2016-06-24 LAB — URINALYSIS, ROUTINE W REFLEX MICROSCOPIC
Bacteria, UA: NONE SEEN
Bilirubin Urine: NEGATIVE
Glucose, UA: >=500 mg/dL — AB
Hgb urine dipstick: NEGATIVE
Ketones, ur: NEGATIVE mg/dL
Leukocytes, UA: NEGATIVE
Nitrite: NEGATIVE
Protein, ur: NEGATIVE mg/dL
Specific Gravity, Urine: 1.028 (ref 1.005–1.030)
Squamous Epithelial / LPF: NONE SEEN
pH: 5 (ref 5.0–8.0)

## 2016-06-24 MED ORDER — VENLAFAXINE HCL ER 75 MG PO CP24: 75.0000 mg | ORAL_CAPSULE | Freq: Every day | ORAL | 0 refills | Status: DC

## 2016-06-24 MED ORDER — SODIUM CHLORIDE 0.9 % IV BOLUS (SEPSIS)
1000.0000 mL | Freq: Once | INTRAVENOUS | Status: AC
  Administered 2016-06-24: 1000 mL via INTRAVENOUS

## 2016-06-24 MED ORDER — DEXTROSE-NACL 5-0.45 % IV SOLN: INTRAVENOUS | Status: DC

## 2016-06-24 MED ORDER — INSULIN ASPART 100 UNIT/ML ~~LOC~~ SOLN
5.0000 [IU] | Freq: Once | SUBCUTANEOUS | Status: AC
  Administered 2016-06-24: 5 [IU] via INTRAVENOUS
  Filled 2016-06-24: qty 1

## 2016-06-24 MED ORDER — ACETAMINOPHEN 325 MG PO TABS
650.0000 mg | ORAL_TABLET | Freq: Once | ORAL | Status: AC
  Administered 2016-06-24: 650 mg via ORAL
  Filled 2016-06-24: qty 2

## 2016-06-24 MED ORDER — SODIUM CHLORIDE 0.9 % IV SOLN
INTRAVENOUS | Status: DC
  Filled 2016-06-24: qty 2.5

## 2016-06-24 MED ORDER — VENLAFAXINE HCL 50 MG PO TABS
50.0000 mg | ORAL_TABLET | Freq: Once | ORAL | Status: AC
  Administered 2016-06-24: 50 mg via ORAL
  Filled 2016-06-24: qty 1

## 2016-06-24 NOTE — ED Notes (Addendum)
Pt ambulated to nurses station requesting to know who said "all she wants is a blanket" this nurse stated that she said that.  When pt called out this nurse was told she needed a blanket but was tied up in another pts room at the time.  When pt called out again a couple minutes later and primary nurse was still with another patient another RN offered to help her and this nurse stated "all she wants is a blanket" in order to go in with requested blanket.  Pt did not appreciate this statement she said and that she also wants something for pain at this time also.  Advised pt that I would let the doctor know of her request.  Delos Haring notified.

## 2016-06-24 NOTE — ED Provider Notes (Signed)
[facility] DEPT Provider Note   CSN: [id number] Arrival date & time: 06/23/16  2258     History   Chief Complaint Chief Complaint  Patient presents with  . Hyperglycemia  . Dizziness    HPI [name] [name] is a 55 y.o. female.  HPI   Patient is a type 2 Diabetic takes Metformin only- insulin nieve with PMH of depression on Effexor 75 mg who comes to the ER complaining of hyperglycemia, dry mouth, increased urination, 1 episode of non nauseous vomiting, and dizziness.   She admits to not checking her glucose at home in the past few weeks. She ran out of her Effexor 1 week ago and could not get a refill from her PCP, she is due to see them today in the office.  Past Medical History:  Diagnosis Date  . Chest pain   . Chronic low back pain    HNP  . Depression dx 1997  . Diabetes mellitus, type 2 ([place]) 2011   No insulin  . GERD (gastroesophageal reflux disease)   . Glaucoma   . Headache(784.0)   . Herniated disc   . Hypertension    Lab 11/2011:  CXR, EKG, CBC, TSH, BMet, troponin-normal; lipid profile: 188, 131, 36, 126   . Pancreatic cyst    Endoscopic aspiration in 09/2009  . Pneumonia 08/02/2012  . Shingles    Patient Active Problem List   Diagnosis Date Noted  . Vertigo 04/09/2016  . Acute sinusitis 06/27/2015  . Cervical disc disorder with radiculopathy of cervical region 02/20/2015  . Neck muscle spasm 10/23/2014  . Healthcare maintenance 10/23/2014  . Depression 11/25/2013  . Diabetes type 2, uncontrolled ([place])     Past Surgical History:  Procedure Laterality Date  . ABDOMINAL HYSTERECTOMY    . CESAREAN SECTION      OB History    No data available      Home Medications    Prior to Admission medications   Medication Sig Start Date End Date Taking? Authorizing Provider  venlafaxine (EFFEXOR) 75 MG tablet Take 75 mg by mouth 2 (two) times daily.   Yes Historical Provider, MD  atorvastatin (LIPITOR) 10 MG tablet Take 1 tablet (10 mg total) by mouth  daily at 6 PM. Patient not taking: Reported on 06/24/2016 04/11/16   Holley Raring, MD  Blood Glucose Monitoring Suppl (TRUE METRIX METER) DEVI 1 kit by Does not apply route as directed. Use as directed by physician 03/22/15   Boykin Nearing, MD  cetirizine (ZYRTEC) 10 MG tablet Take 1 tablet (10 mg total) by mouth daily. Patient not taking: Reported on 06/24/2016 06/27/15   Boykin Nearing, MD  gabapentin (NEURONTIN) 300 MG capsule Take 1 capsule (300 mg total) by mouth 3 (three) times daily. Patient not taking: Reported on 06/24/2016 04/11/16   Holley Raring, MD  glucose blood (TRUE METRIX BLOOD GLUCOSE TEST) test strip 1 each by Other route 3 (three) times daily. Use as instructed 06/27/15   Boykin Nearing, MD  insulin aspart protamine- aspart (NOVOLOG MIX 70/30) (70-30) 100 UNIT/ML injection Inject 0.1 mLs (10 Units total) into the skin 2 (two) times daily with a meal. Patient not taking: Reported on 06/24/2016 04/11/16   Holley Raring, MD  meclizine (ANTIVERT) 25 MG tablet Take 2 tablets (50 mg total) by mouth 2 (two) times daily as needed for dizziness. Patient not taking: Reported on 06/24/2016 04/11/16   Holley Raring, MD  metFORMIN (GLUCOPHAGE) 1000 MG tablet Take 565m BID x 3 days then increase  to 537m qAM and 10016mqPM x 3 days, then take 100050mID Patient not taking: Reported on 06/24/2016 04/11/16   BryHolley RaringD  TRUEPLUS LANCETS 28G MISC Use as directed 03/22/15   JosBoykin NearingD  venlafaxine XR (EFFEXOR XR) 75 MG 24 hr capsule Take 1 capsule (75 mg total) by mouth daily with breakfast. 06/24/16   TifDelos HaringA-C    Family History Family History  Problem Relation Age of Onset  . Depression Mother     type 1    Social History Social History  Substance Use Topics  . Smoking status: Former Smoker    Quit date: 08/03/1978  . Smokeless tobacco: Never Used  . Alcohol use No     Comment: Former    Allergies   Tramadol   Review of Systems Review of Systems Review of  Systems All other systems negative except as documented in the HPI. All pertinent positives and negatives as reviewed in the HPI.   Physical Exam Updated Vital Signs BP 129/84   Pulse 95   Temp 97.8 F (36.6 C) (Oral)   Resp 20   SpO2 98%   Physical Exam  Constitutional: She appears well-developed and well-nourished. No distress.  HENT:  Head: Normocephalic and atraumatic.  Right Ear: Tympanic membrane and ear canal normal.  Left Ear: Tympanic membrane and ear canal normal.  Nose: Nose normal.  Mouth/Throat: Uvula is midline, oropharynx is clear and moist and mucous membranes are normal.  Eyes: Pupils are equal, round, and reactive to light.  Neck: Normal range of motion. Neck supple.  Cardiovascular: Normal rate and regular rhythm.   Pulmonary/Chest: Effort normal.  Abdominal: Soft.  No signs of abdominal distention  Musculoskeletal:  No LE swelling  Neurological: She is alert.  Acting at baseline  Skin: Skin is warm and dry. No rash noted.  Nursing note and vitals reviewed.   ED Treatments / Results  Labs (all labs ordered are listed, but only abnormal results are displayed) Labs Reviewed  BASIC METABOLIC PANEL - Abnormal; Notable for the following:       Result Value   Sodium 130 (*)    Chloride 96 (*)    Glucose, Bld 722 (*)    All other components within normal limits  URINALYSIS, ROUTINE W REFLEX MICROSCOPIC - Abnormal; Notable for the following:    Glucose, UA >=500 (*)    All other components within normal limits  CBG MONITORING, ED - Abnormal; Notable for the following:    Glucose-Capillary >600 (*)    All other components within normal limits  CBG MONITORING, ED - Abnormal; Notable for the following:    Glucose-Capillary 491 (*)    All other components within normal limits  CBG MONITORING, ED - Abnormal; Notable for the following:    Glucose-Capillary 333 (*)    All other components within normal limits  CBC WITH DIFFERENTIAL/PLATELET   EKG  EKG  Interpretation None       Radiology No results found.  Procedures Procedures (including critical care time)  Medications Ordered in ED Medications  acetaminophen (TYLENOL) tablet 650 mg (not administered)  sodium chloride 0.9 % bolus 1,000 mL (0 mLs Intravenous Stopped 06/24/16 0238)  sodium chloride 0.9 % bolus 1,000 mL (0 mLs Intravenous Stopped 06/24/16 0238)  venlafaxine (EFFEXOR) tablet 50 mg (50 mg Oral Given 06/24/16 0140)  insulin aspart (novoLOG) injection 5 Units (5 Units Intravenous Given 06/24/16 0300)   Initial Impression / Assessment and Plan / ED Course  I have reviewed the triage vital signs and the nursing notes.  Pertinent labs & imaging results that were available during my care of the patient were reviewed by me and considered in my medical decision making (see chart for details).  Clinical Course     Dose of effexor 50 mg given. Patient not in DKA- no anion gap but sugar is greater than 700. Given 2 L of fluids at 5 units of insulin her CBG improved to < 330. We had a discussion about her checking her sugar, her mom died of kidney failure due to diabetes and she has the tools and items she needs at home to check glucose but is non compliant. I advised her that she could develop blindness, kidney failure, neuropathy, amputations and many other problems from not controlling her glucose. Will give small rx of Effexor to cover till she see's her PCP on Friday.   I discussed results, diagnoses and plan with Philomena Course. They voice there understanding and questions were answered. We discussed follow-up recommendations and return precautions.   Final Clinical Impressions(s) / ED Diagnoses   Final diagnoses:  Hyperglycemia  Medication refill     New Prescriptions New Prescriptions   VENLAFAXINE XR (EFFEXOR XR) 75 MG 24 HR CAPSULE    Take 1 capsule (75 mg total) by mouth daily with breakfast.     Delos Haring, PA-C 06/24/16 [id number]    Merryl Hacker,  MD 06/28/16 2258

## 2016-09-10 ENCOUNTER — Encounter (HOSPITAL_COMMUNITY): Payer: Self-pay | Admitting: Emergency Medicine

## 2016-09-10 ENCOUNTER — Emergency Department (HOSPITAL_COMMUNITY)
Admission: EM | Admit: 2016-09-10 | Discharge: 2016-09-10 | Disposition: A | Discharge status: Home / Self Care | Payer: Self-pay | Attending: Emergency Medicine | Admitting: Emergency Medicine

## 2016-09-10 DIAGNOSIS — R739 Hyperglycemia, unspecified: Secondary | ICD-10-CM

## 2016-09-10 DIAGNOSIS — Z79899 Other long term (current) drug therapy: Secondary | ICD-10-CM | POA: Insufficient documentation

## 2016-09-10 DIAGNOSIS — Z87891 Personal history of nicotine dependence: Secondary | ICD-10-CM | POA: Insufficient documentation

## 2016-09-10 DIAGNOSIS — E1165 Type 2 diabetes mellitus with hyperglycemia: Secondary | ICD-10-CM | POA: Insufficient documentation

## 2016-09-10 DIAGNOSIS — Z794 Long term (current) use of insulin: Secondary | ICD-10-CM | POA: Insufficient documentation

## 2016-09-10 DIAGNOSIS — Z760 Encounter for issue of repeat prescription: Secondary | ICD-10-CM | POA: Insufficient documentation

## 2016-09-10 DIAGNOSIS — I10 Essential (primary) hypertension: Secondary | ICD-10-CM | POA: Insufficient documentation

## 2016-09-10 LAB — BASIC METABOLIC PANEL
Anion gap: 12 (ref 5–15)
BUN: 9 mg/dL (ref 6–20)
CO2: 21 mmol/L — ABNORMAL LOW (ref 22–32)
Calcium: 9.4 mg/dL (ref 8.9–10.3)
Chloride: 96 mmol/L — ABNORMAL LOW (ref 101–111)
Creatinine, Ser: 0.91 mg/dL (ref 0.44–1.00)
GFR calc Af Amer: >60 mL/min (ref >60)
GFR calc non Af Amer: >60 mL/min (ref >60)
Glucose, Bld: 520 mg/dL (ref 65–99)
Potassium: 4.6 mmol/L (ref 3.5–5.1)
Sodium: 129 mmol/L — ABNORMAL LOW (ref 135–145)

## 2016-09-10 LAB — CBC
HCT: 40.8 % (ref 36.0–46.0)
Hemoglobin: 14.4 g/dL (ref 12.0–15.0)
MCH: 31.7 pg (ref 26.0–34.0)
MCHC: 35.3 g/dL (ref 30.0–36.0)
MCV: 89.9 fL (ref 78.0–100.0)
Platelets: 221 10*3/uL (ref 150–400)
RBC: 4.54 MIL/uL (ref 3.87–5.11)
RDW: 12 % (ref 11.5–15.5)
WBC: 8.9 10*3/uL (ref 4.0–10.5)

## 2016-09-10 LAB — URINALYSIS, ROUTINE W REFLEX MICROSCOPIC
Bilirubin Urine: NEGATIVE
Glucose, UA: >=500 mg/dL — AB
Hgb urine dipstick: NEGATIVE
Ketones, ur: 5 mg/dL — AB
Leukocytes, UA: NEGATIVE
Nitrite: NEGATIVE
Protein, ur: NEGATIVE mg/dL
Specific Gravity, Urine: 1.028 (ref 1.005–1.030)
pH: 5 (ref 5.0–8.0)

## 2016-09-10 LAB — CBG MONITORING, ED: Glucose-Capillary: 272 mg/dL — ABNORMAL HIGH (ref 65–99)

## 2016-09-10 MED ORDER — SODIUM CHLORIDE 0.9 % IV BOLUS (SEPSIS)
1000.0000 mL | Freq: Once | INTRAVENOUS | Status: AC
  Administered 2016-09-10: 1000 mL via INTRAVENOUS

## 2016-09-10 MED ORDER — INSULIN ASPART 100 UNIT/ML ~~LOC~~ SOLN
5.0000 [IU] | Freq: Once | SUBCUTANEOUS | Status: AC
  Administered 2016-09-10: 5 [IU] via INTRAVENOUS
  Filled 2016-09-10: qty 1

## 2016-09-10 MED ORDER — METFORMIN HCL 500 MG PO TABS: 500.0000 mg | ORAL_TABLET | Freq: Two times a day (BID) | ORAL | 0 refills | Status: DC

## 2016-09-10 MED ORDER — ONDANSETRON HCL 4 MG/2ML IJ SOLN
4.0000 mg | Freq: Once | INTRAMUSCULAR | Status: AC
  Administered 2016-09-10: 4 mg via INTRAVENOUS
  Filled 2016-09-10: qty 2

## 2016-09-10 NOTE — ED Notes (Signed)
520 Critical Glucose

## 2016-09-10 NOTE — ED Provider Notes (Signed)
[place] DEPT Provider Note   CSN: [id number] Arrival date & time: 09/10/16  1029     History   Chief Complaint Chief Complaint  Patient presents with  . Hyperglycemia    HPI [name] [name] is a 56 y.o. female with pertinent pmh of poorly controlled/non treated T2DM, GERD, HTN presents to ED reporting "swimmy headedness", double vision, sweats, dry mouth, polydipsia, polyuria and nausea ~1000 today.  Patient states she has had similar symptoms in the past when her BG was elevated so she checked her BG and it was 583.   Patient states she was here a few months ago for similar symptoms, she was discharged with metformin but took it until her "sugars were good" and then stopped.  Patient states she does not have a PCP, can no longer go to [facility] because she makes too much money now with her new job, is looking for a new PCP. No chest pain, shortness of breath, abdominal pain, vomiting, diarrhea, constipation, URI symptoms, dysuria, changes in vision.   HPI  Past Medical History:  Diagnosis Date  . Chest pain   . Chronic low back pain    HNP  . Depression dx 1997  . Diabetes mellitus, type 2 ([place]) 2011   No insulin  . GERD (gastroesophageal reflux disease)   . Glaucoma   . Headache(784.0)   . Herniated disc   . Hypertension    Lab 11/2011:  CXR, EKG, CBC, TSH, BMet, troponin-normal; lipid profile: 188, 131, 36, 126   . Pancreatic cyst    Endoscopic aspiration in 09/2009  . Pneumonia 08/02/2012  . Shingles     Patient Active Problem List   Diagnosis Date Noted  . Vertigo 04/09/2016  . Acute sinusitis 06/27/2015  . Cervical disc disorder with radiculopathy of cervical region 02/20/2015  . Neck muscle spasm 10/23/2014  . Healthcare maintenance 10/23/2014  . Depression 11/25/2013  . Diabetes type 2, uncontrolled ([place])     Past Surgical History:  Procedure Laterality Date  . ABDOMINAL HYSTERECTOMY    . CESAREAN SECTION      OB  History    No data available       Home Medications    Prior to Admission medications   Medication Sig Start Date End Date Taking? Authorizing Provider  atorvastatin (LIPITOR) 10 MG tablet Take 1 tablet (10 mg total) by mouth daily at 6 PM. Patient not taking: Reported on 06/24/2016 04/11/16   Holley Raring, MD  Blood Glucose Monitoring Suppl (TRUE METRIX METER) DEVI 1 kit by Does not apply route as directed. Use as directed by physician 03/22/15   Boykin Nearing, MD  cetirizine (ZYRTEC) 10 MG tablet Take 1 tablet (10 mg total) by mouth daily. Patient not taking: Reported on 06/24/2016 06/27/15   Boykin Nearing, MD  gabapentin (NEURONTIN) 300 MG capsule Take 1 capsule (300 mg total) by mouth 3 (three) times daily. Patient not taking: Reported on 06/24/2016 04/11/16   Holley Raring, MD  glucose blood (TRUE METRIX BLOOD GLUCOSE TEST) test strip 1 each by Other route 3 (three) times daily. Use as instructed 06/27/15   Boykin Nearing, MD  insulin aspart protamine- aspart (NOVOLOG MIX 70/30) (70-30) 100 UNIT/ML injection Inject 0.1 mLs (10 Units total) into the skin 2 (two) times daily with a meal. Patient not taking: Reported on 06/24/2016 04/11/16   Holley Raring, MD  meclizine (ANTIVERT) 25 MG tablet Take 2 tablets (50 mg total) by mouth 2 (two) times daily  as needed for dizziness. Patient not taking: Reported on 06/24/2016 04/11/16   Holley Raring, MD  metFORMIN (GLUCOPHAGE) 500 MG tablet Take 1 tablet (500 mg total) by mouth 2 (two) times daily with a meal. 09/10/16   Kinnie Feil, PA-C  TRUEPLUS LANCETS 28G MISC Use as directed 03/22/15   Boykin Nearing, MD  venlafaxine (EFFEXOR) 75 MG tablet Take 75 mg by mouth 2 (two) times daily.    Historical Provider, MD  venlafaxine XR (EFFEXOR XR) 75 MG 24 hr capsule Take 1 capsule (75 mg total) by mouth daily with breakfast. 06/24/16   Delos Haring, PA-C    Family History Family History  Problem Relation Age of Onset  . Depression Mother     type 1     Social History Social History  Substance Use Topics  . Smoking status: Former Smoker    Quit date: 08/03/1978  . Smokeless tobacco: Never Used  . Alcohol use No     Comment: Former     Allergies   Tramadol   Review of Systems Review of Systems  Constitutional: Negative for appetite change, chills, diaphoresis and fever.  HENT: Negative for congestion and sore throat.   Eyes: Negative for visual disturbance.  Respiratory: Negative for cough, choking, chest tightness and shortness of breath.   Cardiovascular: Negative for chest pain, palpitations and leg swelling.  Gastrointestinal: Positive for nausea. Negative for abdominal pain, constipation, diarrhea and vomiting.  Endocrine: Positive for polydipsia and polyuria. Negative for polyphagia.  Genitourinary: Positive for frequency. Negative for difficulty urinating, dysuria and hematuria.  Musculoskeletal: Negative for arthralgias.  Skin: Negative for rash and wound.  Allergic/Immunologic: Positive for immunocompromised state (T2DM).  Neurological: Positive for light-headedness. Negative for dizziness, seizures, syncope, weakness, numbness and headaches.  Hematological: Does not bruise/bleed easily.  Psychiatric/Behavioral: Negative.      Physical Exam Updated Vital Signs BP 120/76   Pulse 96   Temp 98.2 F (36.8 C) (Oral)   Resp 20   SpO2 100%   Physical Exam  Constitutional: She is oriented to person, place, and time. She appears well-developed and well-nourished.  HENT:  Head: Normocephalic and atraumatic.  Nose: Nose normal.  Mouth/Throat: Uvula is midline. Mucous membranes are dry. No oropharyngeal exudate.  Eyes: Conjunctivae, EOM and lids are normal. Pupils are equal, round, and reactive to light.  Neck: Normal range of motion. Neck supple. No JVD present.  Cardiovascular: Normal rate, regular rhythm, normal heart sounds and intact distal pulses.   No murmur heard. Pulmonary/Chest: Effort normal and  breath sounds normal. No respiratory distress. She has no wheezes. She has no rales. She exhibits no tenderness.  Abdominal: Soft. Bowel sounds are normal. She exhibits no distension and no mass. There is no tenderness. There is no rebound and no guarding.  Musculoskeletal: Normal range of motion. She exhibits no deformity.  Lymphadenopathy:    She has no cervical adenopathy.  Neurological: She is alert and oriented to person, place, and time. No sensory deficit.  Skin: Skin is warm and dry. Capillary refill takes less than 2 seconds.  Psychiatric: She has a normal mood and affect. Her behavior is normal. Judgment and thought content normal.  Nursing note and vitals reviewed.    ED Treatments / Results  Labs (all labs ordered are listed, but only abnormal results are displayed) Labs Reviewed  BASIC METABOLIC PANEL - Abnormal; Notable for the following:       Result Value   Sodium 129 (*)    Chloride  96 (*)    CO2 21 (*)    Glucose, Bld 520 (*)    All other components within normal limits  URINALYSIS, ROUTINE W REFLEX MICROSCOPIC - Abnormal; Notable for the following:    Glucose, UA >=500 (*)    Ketones, ur 5 (*)    Bacteria, UA RARE (*)    Squamous Epithelial / LPF 0-5 (*)    All other components within normal limits  CBG MONITORING, ED - Abnormal; Notable for the following:    Glucose-Capillary 272 (*)    All other components within normal limits  CBC  CBG MONITORING, ED    EKG  EKG Interpretation None       Radiology No results found.  Procedures Procedures (including critical care time)  Medications Ordered in ED Medications  sodium chloride 0.9 % bolus 1,000 mL (1,000 mLs Intravenous New Bag/Given 09/10/16 1201)  ondansetron (ZOFRAN) injection 4 mg (4 mg Intravenous Given 09/10/16 1201)  insulin aspart (novoLOG) injection 5 Units (5 Units Intravenous Given 09/10/16 1202)  sodium chloride 0.9 % bolus 1,000 mL (0 mLs Intravenous Stopped 09/10/16 1320)      Initial Impression / Assessment and Plan / ED Course  I have reviewed the triage vital signs and the nursing notes.  Pertinent labs & imaging results that were available during my care of the patient were reviewed by me and considered in my medical decision making (see chart for details).  Clinical Course as of Sep 10 1354  Thu Sep 10, 2016  1257 Glucose: (!) >=500 [CG]  1257 Ketones, ur: (!) 5 [CG]  1258 Glucose: (!!) 520 [CG]  1258 No anion gap Anion gap: 12 [CG]  1258 No leukocytosis WBC: 8.9 [CG]    Clinical Course User Index [CG] Kinnie Feil, PA-C   56 yo female with pertinent pmh poorly controlled T2DM noncompliant with metformin, GERD, HTN presents to ED reporting "swimmy headedness", double vision, sweats, dry mouth, polydipsia, polyuria and nausea ~1000 today. Pt checked her BG at home and it was 583.  Last seen in ED for same on 06/23/16 with BG >700.  At that time pt was txted in ED and instructed to f/u with PCP for better control of T2DM, patient has not been to PCP as her insurance doesn't start until March.  On exam patient is non toxic appearing with dry mucous membranes.  Cardiac, pulmonary and abdominal exam benign.  Vital signs within normal limits. ED work up remarkable for hyperglycemia >500, ketones >5 with no anion gap prior to IVF and insulin.  Pt received 2L IVF and 5 units insulin in ED which improved her CBG to 272, patient reported resolution of symptoms prior to discharge.  Patient is deemed safe for discharge at this time.  VS within normal limits prior to discharge. Patient tolerating PO food and fluids in ED. No further ED tx needed at this time as patient is otherwise asymptomatic.  I discussed complications of untreated hyperglycemia including nephropathy, neuropathy, retinopathy and cardiac issues with patient and encouraged her to change her diet, check her BG and take her metformin as prescribed. Patient verbalized understanding of potential  complications of untreated T2DM. Patient states she will establish care with a PCP for T2DM monitoring next month when her insurance kicks in.  Patient will be discharged with rx for metformin.  Diabetes information given. Strongly encouraged patient to be compliant with her metformin until PCP appointment.  Final Clinical Impressions(s) / ED Diagnoses   Final diagnoses:  Hyperglycemia  Medication refill    New Prescriptions New Prescriptions   METFORMIN (GLUCOPHAGE) 500 MG TABLET    Take 1 tablet (500 mg total) by mouth 2 (two) times daily with a meal.     Kinnie Feil, PA-C 09/10/16 [place] Liu, MD 09/10/16 [phone]

## 2016-09-10 NOTE — ED Notes (Signed)
PA at bedside.

## 2016-09-10 NOTE — ED Triage Notes (Addendum)
States checked her sugar at work and  It was high , she states she had double vision and that made her check and she started to sweat, states has not on any meds for diabetes since last summer, states has not meds refilled due to no $

## 2016-09-10 NOTE — Discharge Instructions (Signed)
Please take metformin twice a day with meals.  This medication may cause GI upset including diarrhea, this should resolve or at least improve over the first few weeks.    As we discussed untreated elevated blood sugar can cause many complications in the long term including eye problem, nerve problems, cardiac problems and kidney problems.  Please establish care with a new primary care provider as soon as your are able to get yearly diabetes check ups.

## 2016-12-12 ENCOUNTER — Encounter (HOSPITAL_COMMUNITY): Payer: Self-pay | Admitting: Emergency Medicine

## 2016-12-12 ENCOUNTER — Emergency Department (HOSPITAL_COMMUNITY): Payer: Self-pay

## 2016-12-12 ENCOUNTER — Emergency Department (HOSPITAL_COMMUNITY)
Admission: EM | Admit: 2016-12-12 | Discharge: 2016-12-12 | Disposition: A | Discharge status: Home / Self Care | Payer: Self-pay | Attending: Physician Assistant | Admitting: Physician Assistant

## 2016-12-12 DIAGNOSIS — E119 Type 2 diabetes mellitus without complications: Secondary | ICD-10-CM | POA: Insufficient documentation

## 2016-12-12 DIAGNOSIS — W010XXA Fall on same level from slipping, tripping and stumbling without subsequent striking against object, initial encounter: Secondary | ICD-10-CM | POA: Insufficient documentation

## 2016-12-12 DIAGNOSIS — Y999 Unspecified external cause status: Secondary | ICD-10-CM | POA: Insufficient documentation

## 2016-12-12 DIAGNOSIS — Z79899 Other long term (current) drug therapy: Secondary | ICD-10-CM | POA: Insufficient documentation

## 2016-12-12 DIAGNOSIS — Y939 Activity, unspecified: Secondary | ICD-10-CM | POA: Insufficient documentation

## 2016-12-12 DIAGNOSIS — Y92009 Unspecified place in unspecified non-institutional (private) residence as the place of occurrence of the external cause: Secondary | ICD-10-CM | POA: Insufficient documentation

## 2016-12-12 DIAGNOSIS — L97521 Non-pressure chronic ulcer of other part of left foot limited to breakdown of skin: Secondary | ICD-10-CM | POA: Insufficient documentation

## 2016-12-12 DIAGNOSIS — I10 Essential (primary) hypertension: Secondary | ICD-10-CM | POA: Insufficient documentation

## 2016-12-12 DIAGNOSIS — S91332A Puncture wound without foreign body, left foot, initial encounter: Secondary | ICD-10-CM | POA: Insufficient documentation

## 2016-12-12 DIAGNOSIS — Z87891 Personal history of nicotine dependence: Secondary | ICD-10-CM | POA: Insufficient documentation

## 2016-12-12 DIAGNOSIS — Z794 Long term (current) use of insulin: Secondary | ICD-10-CM | POA: Insufficient documentation

## 2016-12-12 DIAGNOSIS — T148XXA Other injury of unspecified body region, initial encounter: Secondary | ICD-10-CM

## 2016-12-12 MED ORDER — CEPHALEXIN 500 MG PO CAPS: 500.0000 mg | ORAL_CAPSULE | Freq: Four times a day (QID) | ORAL | 0 refills | Status: DC

## 2016-12-12 NOTE — ED Triage Notes (Signed)
PT updated  On x-ray results of Lt foot.

## 2016-12-12 NOTE — ED Triage Notes (Signed)
Pt reports that she slid on her kitchen floor causing pain to left foot. Pt presents with post-op shoe given to her by a friend.

## 2016-12-12 NOTE — ED Provider Notes (Signed)
[place] DEPT Provider Note   CSN: [id number] Arrival date & time: 12/12/16  1247  By signing my name below, I, Margit Banda, attest that this documentation has been prepared under the direction and in the presence of Rossi Burdo A. Kambryn Dapolito, PA-C. Electronically Signed: Margit Banda, ED Scribe. 12/12/16. 3:07 PM.  History   Chief Complaint Chief Complaint  Patient presents with  . Foot Injury    HPI [name] [name] is a 56 y.o. female with a PMHx of DM type 2, who presents to the Emergency Department complaining of gradually worsening, stabbing, left foot pain that started ~ two weeks ago on Friday 11/27/16. Pt reports she slipped on water in her kitchen causing her to slip and fall on her left side resulting in pain to her foot. Pt notes it took her ~ 10 minutes to get up off of the floor. Associated sx include intermittent numbness and tingling. She has taken ibuprofen with mild relief. She is wearing a post-op shoe that was given to her by a friend. Being on her feet all day for work exacerbates her pain. Nothing seems to make it better. Pt had a hairline fracture a few years ago to the exremity. Her feet don't get sweaty or wet while working. Pt denies HA.  The history is provided by the patient. No language interpreter was used.   Past Medical History:  Diagnosis Date  . Chest pain   . Chronic low back pain    HNP  . Depression dx 1997  . Diabetes mellitus, type 2 ([place]) 2011   No insulin  . GERD (gastroesophageal reflux disease)   . Glaucoma   . Headache(784.0)   . Herniated disc   . Hypertension    Lab 11/2011:  CXR, EKG, CBC, TSH, BMet, troponin-normal; lipid profile: 188, 131, 36, 126   . Pancreatic cyst    Endoscopic aspiration in 09/2009  . Pneumonia 08/02/2012  . Shingles     Patient Active Problem List   Diagnosis Date Noted  . Vertigo 04/09/2016  . Acute sinusitis 06/27/2015  . Cervical disc disorder with radiculopathy of cervical region 02/20/2015  . Neck  muscle spasm 10/23/2014  . Healthcare maintenance 10/23/2014  . Depression 11/25/2013  . Diabetes type 2, uncontrolled ([place])     Past Surgical History:  Procedure Laterality Date  . ABDOMINAL HYSTERECTOMY    . CESAREAN SECTION      OB History    No data available       Home Medications    Prior to Admission medications   Medication Sig Start Date End Date Taking? Authorizing Provider  atorvastatin (LIPITOR) 10 MG tablet Take 1 tablet (10 mg total) by mouth daily at 6 PM. Patient not taking: Reported on 06/24/2016 04/11/16   Holley Raring, MD  Blood Glucose Monitoring Suppl (TRUE METRIX METER) DEVI 1 kit by Does not apply route as directed. Use as directed by physician 03/22/15   Boykin Nearing, MD  cephALEXin (KEFLEX) 500 MG capsule Take 1 capsule (500 mg total) by mouth 4 (four) times daily. 12/12/16   Cherylyn Sundby A, PA-C  cetirizine (ZYRTEC) 10 MG tablet Take 1 tablet (10 mg total) by mouth daily. Patient not taking: Reported on 06/24/2016 06/27/15   Boykin Nearing, MD  gabapentin (NEURONTIN) 300 MG capsule Take 1 capsule (300 mg total) by mouth 3 (three) times daily. Patient not taking: Reported on 06/24/2016 04/11/16   Holley Raring, MD  glucose blood (TRUE METRIX BLOOD GLUCOSE TEST) test strip 1 each  by Other route 3 (three) times daily. Use as instructed 06/27/15   Boykin Nearing, MD  insulin aspart protamine- aspart (NOVOLOG MIX 70/30) (70-30) 100 UNIT/ML injection Inject 0.1 mLs (10 Units total) into the skin 2 (two) times daily with a meal. Patient not taking: Reported on 06/24/2016 04/11/16   Holley Raring, MD  meclizine (ANTIVERT) 25 MG tablet Take 2 tablets (50 mg total) by mouth 2 (two) times daily as needed for dizziness. Patient not taking: Reported on 06/24/2016 04/11/16   Holley Raring, MD  metFORMIN (GLUCOPHAGE) 500 MG tablet Take 1 tablet (500 mg total) by mouth 2 (two) times daily with a meal. 09/10/16   Kinnie Feil, PA-C  TRUEPLUS LANCETS 28G MISC Use as  directed 03/22/15   Boykin Nearing, MD  venlafaxine (EFFEXOR) 75 MG tablet Take 75 mg by mouth 2 (two) times daily.    [provider]  venlafaxine XR (EFFEXOR XR) 75 MG 24 hr capsule Take 1 capsule (75 mg total) by mouth daily with breakfast. 06/24/16   Delos Haring, PA-C    Family History Family History  Problem Relation Age of Onset  . Depression Mother        type 1    Social History Social History  Substance Use Topics  . Smoking status: Former Smoker    Quit date: 08/03/1978  . Smokeless tobacco: Never Used  . Alcohol use No     Comment: Former     Allergies   Tramadol   Review of Systems Review of Systems  Constitutional: Negative for activity change.  Respiratory: Negative for shortness of breath.   Cardiovascular: Negative for chest pain.  Gastrointestinal: Negative for abdominal pain.  Musculoskeletal: Positive for arthralgias. Negative for back pain.  Skin: Negative for rash.  Neurological: Positive for numbness. Negative for headaches.     Physical Exam Updated Vital Signs BP 124/72 (BP Location: Right Arm)   Pulse 72   Temp 98.6 F (37 C) (Oral)   Resp 18   Ht 5' 6"  (1.676 m)   Wt 65.8 kg (145 lb)   SpO2 98%   BMI 23.40 kg/m   Physical Exam  Constitutional: No distress.  HENT:  Head: Normocephalic.  Eyes: Conjunctivae are normal.  Neck: Neck supple.  Cardiovascular: Normal rate and regular rhythm.  Exam reveals no gallop and no friction rub.   No murmur heard. Pulmonary/Chest: Effort normal. No respiratory distress.  Abdominal: Soft. She exhibits no distension.  Musculoskeletal:  Medial aspect of the second digit of left foot there is an area of mild swelling and ecchymosis with a central punctate.  Lateal aspect of fourth digit there is a circular area of ecchymosis and swelling of the entire digit.   Severe pain with ROM of the digits and with separating the digits to examine the inter-web space. No evidence of surrounding  cellulitis.  Neurological: She is alert.  Skin: Skin is warm. No rash noted.  Psychiatric: Her behavior is normal.  Nursing note and vitals reviewed.  ED Treatments / Results  DIAGNOSTIC STUDIES: Oxygen Saturation is 99% on RA, normal by my interpretation.   COORDINATION OF CARE: 3:07 PM-Discussed next steps with pt. Pt verbalized understanding and is agreeable with the plan.    Labs (all labs ordered are listed, but only abnormal results are displayed) Labs Reviewed - No data to display  EKG  EKG Interpretation None       Radiology No results found.  Procedures Procedures (including critical care time)  Medications  Ordered in ED Medications - No data to display   Initial Impression / Assessment and Plan / ED Course  I have reviewed the triage vital signs and the nursing notes.  Pertinent labs & imaging results that were available during my care of the patient were reviewed by me and considered in my medical decision making (see chart for details).     Patient with a h/o of DM with a punctate would to the second toe and a small amount of skin breakdown to the fourth digit of the left foot. Discussed with the patient that these wounds are unlikely related to the fall 2 weeks ago. The patient does not have insurance and her last visit to her PCP was in 2016.  Of note, the patient's most recent ED and outpatient visits pertained to hyperglycemia and uncontrolled DM Type II. 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 I am concerned for poor follow-up after this visit and worsening of these wounds since they appear have come from wearing shoes that are too tight, causing the toes to rub together. Will d/c with a short course of Keflex and referral to [facility]. Provided the patient with a Camwalker to provide more protection for the digits than the post-op shoe that will allow her to continuing working. Discussed return precautions. VSS. NAD.    Final Clinical Impressions(s) / ED Diagnoses   Final diagnoses:  Puncture wound  Ulcerated, foot, left, limited to breakdown of skin [facility])    New Prescriptions Discharge Medication List as of 12/12/2016  3:28 PM    START taking these medications   Details  cephALEXin (KEFLEX) 500 MG capsule Take 1 capsule (500 mg total) by mouth 4 (four) times daily., Starting Sat 12/12/2016, Print       I personally performed the services described in this documentation, which was scribed in my presence. The recorded information has been reviewed and is accurate.     Joanne Gavel, PA-C 12/15/16 1153    Macarthur Critchley, MD 12/17/16 [phone]

## 2016-12-12 NOTE — Discharge Instructions (Signed)
Please make sure to clean between the toes with warm soap and water. You can continue to take ibuprofen and Tylenol as needed for inflammation and pain control. Please ice and elevate the foot when possible. Please fil the prescription of Keflex and take it 4 times per day for the next 5 days. Please call [facility] to schedule a follow up appointment. If you develop worsening symptoms, including fever or the wounds worsen, please return for re-evaluation.

## 2017-02-18 ENCOUNTER — Emergency Department (HOSPITAL_COMMUNITY): Payer: BLUE CROSS/BLUE SHIELD

## 2017-02-18 ENCOUNTER — Encounter (HOSPITAL_COMMUNITY): Payer: Self-pay | Admitting: Emergency Medicine

## 2017-02-18 ENCOUNTER — Emergency Department (HOSPITAL_COMMUNITY)
Admission: EM | Admit: 2017-02-18 | Discharge: 2017-02-18 | Disposition: A | Discharge status: Home / Self Care | Payer: BLUE CROSS/BLUE SHIELD | Attending: Emergency Medicine | Admitting: Emergency Medicine

## 2017-02-18 DIAGNOSIS — R740 Nonspecific elevation of levels of transaminase and lactic acid dehydrogenase [LDH]: Secondary | ICD-10-CM | POA: Diagnosis not present

## 2017-02-18 DIAGNOSIS — E1165 Type 2 diabetes mellitus with hyperglycemia: Secondary | ICD-10-CM | POA: Insufficient documentation

## 2017-02-18 DIAGNOSIS — Z7984 Long term (current) use of oral hypoglycemic drugs: Secondary | ICD-10-CM | POA: Insufficient documentation

## 2017-02-18 DIAGNOSIS — R7401 Elevation of levels of liver transaminase levels: Secondary | ICD-10-CM

## 2017-02-18 DIAGNOSIS — Z87891 Personal history of nicotine dependence: Secondary | ICD-10-CM | POA: Insufficient documentation

## 2017-02-18 DIAGNOSIS — R1084 Generalized abdominal pain: Secondary | ICD-10-CM | POA: Diagnosis not present

## 2017-02-18 DIAGNOSIS — M47812 Spondylosis without myelopathy or radiculopathy, cervical region: Secondary | ICD-10-CM | POA: Diagnosis not present

## 2017-02-18 DIAGNOSIS — R739 Hyperglycemia, unspecified: Secondary | ICD-10-CM

## 2017-02-18 DIAGNOSIS — Z79899 Other long term (current) drug therapy: Secondary | ICD-10-CM | POA: Diagnosis not present

## 2017-02-18 DIAGNOSIS — R202 Paresthesia of skin: Secondary | ICD-10-CM | POA: Insufficient documentation

## 2017-02-18 DIAGNOSIS — R7989 Other specified abnormal findings of blood chemistry: Secondary | ICD-10-CM | POA: Diagnosis not present

## 2017-02-18 DIAGNOSIS — I10 Essential (primary) hypertension: Secondary | ICD-10-CM | POA: Insufficient documentation

## 2017-02-18 DIAGNOSIS — R110 Nausea: Secondary | ICD-10-CM | POA: Diagnosis not present

## 2017-02-18 DIAGNOSIS — S82045A Nondisplaced comminuted fracture of left patella, initial encounter for closed fracture: Secondary | ICD-10-CM | POA: Diagnosis not present

## 2017-02-18 DIAGNOSIS — K862 Cyst of pancreas: Secondary | ICD-10-CM | POA: Diagnosis not present

## 2017-02-18 DIAGNOSIS — M79672 Pain in left foot: Secondary | ICD-10-CM | POA: Diagnosis not present

## 2017-02-18 DIAGNOSIS — M5136 Other intervertebral disc degeneration, lumbar region: Secondary | ICD-10-CM | POA: Diagnosis not present

## 2017-02-18 DIAGNOSIS — R200 Anesthesia of skin: Secondary | ICD-10-CM | POA: Diagnosis not present

## 2017-02-18 HISTORY — DX: Dizziness and giddiness: R42

## 2017-02-18 HISTORY — DX: Patient's noncompliance with other medical treatment and regimen: Z91.19

## 2017-02-18 HISTORY — DX: Patient's noncompliance with other medical treatment and regimen due to unspecified reason: Z91.199

## 2017-02-18 HISTORY — DX: Cervical disc disorder with radiculopathy, unspecified cervical region: M50.10

## 2017-02-18 LAB — COMPREHENSIVE METABOLIC PANEL
ALT: 100 U/L — ABNORMAL HIGH (ref 14–54)
AST: 80 U/L — ABNORMAL HIGH (ref 15–41)
Albumin: 3.6 g/dL (ref 3.5–5.0)
Alkaline Phosphatase: 139 U/L — ABNORMAL HIGH (ref 38–126)
Anion gap: 10 (ref 5–15)
BUN: 6 mg/dL (ref 6–20)
CO2: 23 mmol/L (ref 22–32)
Calcium: 8.9 mg/dL (ref 8.9–10.3)
Chloride: 99 mmol/L — ABNORMAL LOW (ref 101–111)
Creatinine, Ser: 0.53 mg/dL (ref 0.44–1.00)
GFR calc Af Amer: >60 mL/min (ref >60)
GFR calc non Af Amer: >60 mL/min (ref >60)
Glucose, Bld: 430 mg/dL — ABNORMAL HIGH (ref 65–99)
Potassium: 4.1 mmol/L (ref 3.5–5.1)
Sodium: 132 mmol/L — ABNORMAL LOW (ref 135–145)
Total Bilirubin: 1 mg/dL (ref 0.3–1.2)
Total Protein: 7 g/dL (ref 6.5–8.1)

## 2017-02-18 LAB — URINALYSIS, ROUTINE W REFLEX MICROSCOPIC
Bacteria, UA: NONE SEEN
Bilirubin Urine: NEGATIVE
Glucose, UA: >=500 mg/dL — AB
Hgb urine dipstick: NEGATIVE
Ketones, ur: 20 mg/dL — AB
Leukocytes, UA: NEGATIVE
Nitrite: NEGATIVE
Protein, ur: NEGATIVE mg/dL
Specific Gravity, Urine: 1.033 — ABNORMAL HIGH (ref 1.005–1.030)
Squamous Epithelial / LPF: NONE SEEN
pH: 6 (ref 5.0–8.0)

## 2017-02-18 LAB — CBC WITH DIFFERENTIAL/PLATELET
Basophils Absolute: 0 10*3/uL (ref 0.0–0.1)
Basophils Relative: 0 %
Eosinophils Absolute: 0.1 10*3/uL (ref 0.0–0.7)
Eosinophils Relative: 2 %
HCT: 39.7 % (ref 36.0–46.0)
Hemoglobin: 14.1 g/dL (ref 12.0–15.0)
Lymphocytes Relative: 29 %
Lymphs Abs: 1.5 10*3/uL (ref 0.7–4.0)
MCH: 32.2 pg (ref 26.0–34.0)
MCHC: 35.5 g/dL (ref 30.0–36.0)
MCV: 90.6 fL (ref 78.0–100.0)
Monocytes Absolute: 0.3 10*3/uL (ref 0.1–1.0)
Monocytes Relative: 6 %
Neutro Abs: 3.3 10*3/uL (ref 1.7–7.7)
Neutrophils Relative %: 63 %
Platelets: 181 10*3/uL (ref 150–400)
RBC: 4.38 MIL/uL (ref 3.87–5.11)
RDW: 11.5 % (ref 11.5–15.5)
WBC: 5.2 10*3/uL (ref 4.0–10.5)

## 2017-02-18 LAB — CBG MONITORING, ED
Glucose-Capillary: 427 mg/dL — ABNORMAL HIGH (ref 65–99)
Glucose-Capillary: 358 mg/dL — ABNORMAL HIGH (ref 65–99)

## 2017-02-18 LAB — LIPASE, BLOOD: Lipase: 117 U/L — ABNORMAL HIGH (ref 11–51)

## 2017-02-18 LAB — TROPONIN I: Troponin I: <0.03 ng/mL (ref <0.03)

## 2017-02-18 MED ORDER — METFORMIN HCL 500 MG PO TABS
500.0000 mg | ORAL_TABLET | Freq: Once | ORAL | Status: AC
Start: 2017-02-18 — End: 2017-02-18
  Administered 2017-02-18: 500 mg via ORAL
  Filled 2017-02-18: qty 1

## 2017-02-18 MED ORDER — METFORMIN HCL 500 MG PO TABS: 500.0000 mg | ORAL_TABLET | Freq: Two times a day (BID) | ORAL | 0 refills | Status: DC

## 2017-02-18 MED ORDER — FAMOTIDINE IN NACL 20-0.9 MG/50ML-% IV SOLN
20.0000 mg | Freq: Once | INTRAVENOUS | Status: AC
  Administered 2017-02-18: 20 mg via INTRAVENOUS
  Filled 2017-02-18: qty 50

## 2017-02-18 MED ORDER — ONDANSETRON HCL 4 MG/2ML IJ SOLN
4.0000 mg | INTRAMUSCULAR | Status: DC | PRN
  Administered 2017-02-18: 4 mg via INTRAVENOUS
  Filled 2017-02-18: qty 2

## 2017-02-18 MED ORDER — SODIUM CHLORIDE 0.9 % IV BOLUS (SEPSIS)
1000.0000 mL | Freq: Once | INTRAVENOUS | Status: AC
  Administered 2017-02-18: 1000 mL via INTRAVENOUS

## 2017-02-18 MED ORDER — METOCLOPRAMIDE HCL 10 MG PO TABS: 10.0000 mg | ORAL_TABLET | Freq: Three times a day (TID) | ORAL | 0 refills | Status: DC | PRN

## 2017-02-18 NOTE — ED Notes (Signed)
Patient transported to CT 

## 2017-02-18 NOTE — ED Notes (Signed)
Pt was able to drink rest of cup of water.  Pt ambulated to BR without difficultly

## 2017-02-18 NOTE — Discharge Instructions (Signed)
Your CT scan showed incidental finding(s):  "16 mm hypoattenuating/cystic lesion is identified in the pancreatic tail. This has decreased since 2011 when it measured up to 3.2 cm. 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 Follow-up CTA and 12 months recommended to ensure stability."  Your liver function tests were also mildly elevated (again) today and will need to be re-checked. Avoid tylenol, tylenol containing products, and alcohol until you are seen in follow up. Take the prescriptions as directed.  Increase your fluid intake (ie:  Gatoraide) for the next few days, as discussed.  Eat a liquid diet, then bland diet for the next 3 days, then advance to your regular diet slowly as you can tolerate it.  Call your regular medical doctor today to schedule a follow up appointment this week.  Return to the Emergency Department immediately sooner if worsening.

## 2017-02-18 NOTE — ED Triage Notes (Signed)
Pt reports primary complaint as left middle finger pain.  Also c/o severe nausea since last night.  Has not had diabetic meds in 2 days and has not checked glucose in over a week.

## 2017-02-18 NOTE — ED Provider Notes (Signed)
[facility] DEPT Provider Note   CSN: [id number] Arrival date & time: 02/18/17  7829     History   Chief Complaint Chief Complaint  Patient presents with  . Nausea  . Hand Pain    HPI [name] [name] is a 56 y.o. female.  HPI  Pt was seen at [facility]. Per pt, c/o gradual onset and persistence of constant multiple complaints for the past 1 week. Complaints include: left middle palmar finger "burning" pain, left 3rd toe "numbness," nausea. Pt states she has not taken her DM meds for at least the past 5 days. Pt states she "gets her meds from a friend" because she does not have a PMD. Denies CP/SOB, no cough, no change in chronic neck or back pain, no abd pain, no vomiting/diarrhea, no focal motor weakness, no tingling/numbness in extremities, no fevers, no rash, no injury.   Past Medical History:  Diagnosis Date  . Cervical disc disorder with radiculopathy of cervical region   . Chest pain   . Chronic low back pain    HNP  . Depression dx 1997  . Diabetes mellitus, type 2 ([place]) 2011   No insulin  . GERD (gastroesophageal reflux disease)   . Glaucoma   . Headache(784.0)   . Herniated disc   . Hypertension    Lab 11/2011:  CXR, EKG, CBC, TSH, BMet, troponin-normal; lipid profile: 188, 131, 36, 126   . Non-compliance   . Pancreatic cyst    Endoscopic aspiration in 09/2009  . Pneumonia 08/02/2012  . Shingles   . Vertigo     Patient Active Problem List   Diagnosis Date Noted  . Vertigo 04/09/2016  . Acute sinusitis 06/27/2015  . Cervical disc disorder with radiculopathy of cervical region 02/20/2015  . Neck muscle spasm 10/23/2014  . Healthcare maintenance 10/23/2014  . Depression 11/25/2013  . Diabetes type 2, uncontrolled ([place])     Past Surgical History:  Procedure Laterality Date  . ABDOMINAL HYSTERECTOMY    . CESAREAN SECTION      OB History    No data available       Home Medications    Prior to Admission medications   Medication Sig Start Date End Date  Taking? Authorizing Provider  Blood Glucose Monitoring Suppl (TRUE METRIX METER) DEVI 1 kit by Does not apply route as directed. Use as directed by physician 03/22/15   Boykin Nearing, MD  cephALEXin (KEFLEX) 500 MG capsule Take 1 capsule (500 mg total) by mouth 4 (four) times daily. 12/12/16   McDonald, Mia A, PA-C  glucose blood (TRUE METRIX BLOOD GLUCOSE TEST) test strip 1 each by Other route 3 (three) times daily. Use as instructed 06/27/15   Boykin Nearing, MD  metFORMIN (GLUCOPHAGE) 500 MG tablet Take 1 tablet (500 mg total) by mouth 2 (two) times daily with a meal. 09/10/16   Kinnie Feil, PA-C  TRUEPLUS LANCETS 28G MISC Use as directed 03/22/15   Boykin Nearing, MD  venlafaxine (EFFEXOR) 75 MG tablet Take 75 mg by mouth 2 (two) times daily.    [provider]  venlafaxine XR (EFFEXOR XR) 75 MG 24 hr capsule Take 1 capsule (75 mg total) by mouth daily with breakfast. 06/24/16   Delos Haring, PA-C    Family History Family History  Problem Relation Age of Onset  . Depression Mother        type 1    Social History Social History  Substance Use Topics  . Smoking status: Former Smoker  Quit date: 08/03/1978  . Smokeless tobacco: Never Used  . Alcohol use No     Comment: Former     Allergies   Tramadol   Review of Systems Review of Systems ROS: Statement: All systems negative except as marked or noted in the HPI; Constitutional: Negative for fever and chills. ; ; Eyes: Negative for eye pain, redness and discharge. ; ; ENMT: Negative for ear pain, hoarseness, nasal congestion, sinus pressure and sore throat. ; ; Cardiovascular: Negative for chest pain, palpitations, diaphoresis, dyspnea and peripheral edema. ; ; Respiratory: Negative for cough, wheezing and stridor. ; ; Gastrointestinal: +nausea. Negative for vomiting, diarrhea, abdominal pain, blood in stool, hematemesis, jaundice and rectal bleeding. . ; ; Genitourinary: Negative for dysuria, flank pain and  hematuria. ; ; Musculoskeletal: Negative for back pain and neck pain. Negative for swelling and trauma.; ; Skin: Negative for pruritus, rash, abrasions, blisters, bruising and skin lesion.; ; Neuro: +paresthesias. Negative for headache, lightheadedness and neck stiffness. Negative for weakness, altered level of consciousness, altered mental status, extremity weakness, involuntary movement, seizure and syncope.      Physical Exam Updated Vital Signs BP 92/82 (BP Location: Left Arm)   Pulse 95   Temp 98 F (36.7 C) (Oral)   Resp 18   Ht 5' 6"  (1.676 m)   Wt 65.8 kg (145 lb)   SpO2 100%   BMI 23.40 kg/m   Physical Exam 0845: Physical examination:  Nursing notes reviewed; Vital signs and O2 SAT reviewed;  Constitutional: Well developed, Well nourished, Well hydrated, In no acute distress; Head:  Normocephalic, atraumatic; Eyes: EOMI, PERRL, No scleral icterus; ENMT: Mouth and pharynx normal, Mucous membranes moist; Neck: Supple, Full range of motion, No lymphadenopathy; Cardiovascular: Regular rate and rhythm, No gallop; Respiratory: Breath sounds clear & equal bilaterally, No wheezes.  Speaking full sentences with ease, Normal respiratory effort/excursion; Chest: Nontender, Movement normal; Abdomen: Soft, Nontender, Nondistended, Normal bowel sounds; Genitourinary: No CVA tenderness; Spine:  No midline CS, TS, LS tenderness. +TTP left hypertonic trapezius muscle.;; Extremities: Pulses normal, NT left hand/fingers, left ankle/foot/toes. No open wounds, no ecchymosis, no rash, no deformity, no edema, No calf tenderness, edema or asymmetry.; Neuro: AA&Ox3, Major CN grossly intact.  Speech clear. No gross focal motor or sensory deficits in extremities. Climbs on and off stretcher easily by herself. Gait steady.; Skin: Color normal, Warm, Dry.   ED Treatments / Results  Labs (all labs ordered are listed, but only abnormal results are displayed)   EKG  EKG Interpretation  Date/Time:  Thursday  February 18 2017 08:56:19 EDT Ventricular Rate:  84 PR Interval:    QRS Duration: 86 QT Interval:  389 QTC Calculation: 460 R Axis:   83 Text Interpretation:  Sinus rhythm Baseline wander When compared with ECG of 04/09/2016 No significant change was found Confirmed by Francine Graven [phone]) on 02/18/2017 9:02:30 AM       Radiology   Procedures Procedures (including critical care time)  Medications Ordered in ED Medications  sodium chloride 0.9 % bolus 1,000 mL (1,000 mLs Intravenous New Bag/Given 02/18/17 0900)     Initial Impression / Assessment and Plan / ED Course  I have reviewed the triage vital signs and the nursing notes.  Pertinent labs & imaging results that were available during my care of the patient were reviewed by me and considered in my medical decision making (see chart for details).  MDM Reviewed: previous chart, nursing note and vitals Reviewed previous: MRI, labs and ECG Interpretation:  ECG, labs, x-ray and CT scan   Results for orders placed or performed during the hospital encounter of 02/18/17  Comprehensive metabolic panel  Result Value Ref Range   Sodium 132 (L) 135 - 145 mmol/L   Potassium 4.1 3.5 - 5.1 mmol/L   Chloride 99 (L) 101 - 111 mmol/L   CO2 23 22 - 32 mmol/L   Glucose, Bld 430 (H) 65 - 99 mg/dL   BUN 6 6 - 20 mg/dL   Creatinine, Ser 0.53 0.44 - 1.00 mg/dL   Calcium 8.9 8.9 - 10.3 mg/dL   Total Protein 7.0 6.5 - 8.1 g/dL   Albumin 3.6 3.5 - 5.0 g/dL   AST 80 (H) 15 - 41 U/L   ALT 100 (H) 14 - 54 U/L   Alkaline Phosphatase 139 (H) 38 - 126 U/L   Total Bilirubin 1.0 0.3 - 1.2 mg/dL   GFR calc non Af Amer >60 >60 mL/min   GFR calc Af Amer >60 >60 mL/min   Anion gap 10 5 - 15  Lipase, blood  Result Value Ref Range   Lipase 117 (H) 11 - 51 U/L  Troponin I  Result Value Ref Range   Troponin I <0.03 <0.03 ng/mL  CBC with Differential  Result Value Ref Range   WBC 5.2 4.0 - 10.5 K/uL   RBC 4.38 3.87 - 5.11 MIL/uL   Hemoglobin  14.1 12.0 - 15.0 g/dL   HCT 39.7 36.0 - 46.0 %   MCV 90.6 78.0 - 100.0 fL   MCH 32.2 26.0 - 34.0 pg   MCHC 35.5 30.0 - 36.0 g/dL   RDW 11.5 11.5 - 15.5 %   Platelets 181 150 - 400 K/uL   Neutrophils Relative % 63 %   Neutro Abs 3.3 1.7 - 7.7 K/uL   Lymphocytes Relative 29 %   Lymphs Abs 1.5 0.7 - 4.0 K/uL   Monocytes Relative 6 %   Monocytes Absolute 0.3 0.1 - 1.0 K/uL   Eosinophils Relative 2 %   Eosinophils Absolute 0.1 0.0 - 0.7 K/uL   Basophils Relative 0 %   Basophils Absolute 0.0 0.0 - 0.1 K/uL  Urinalysis, Routine w reflex microscopic  Result Value Ref Range   Color, Urine STRAW (A) YELLOW   APPearance CLEAR CLEAR   Specific Gravity, Urine 1.033 (H) 1.005 - 1.030   pH 6.0 5.0 - 8.0   Glucose, UA >=500 (A) NEGATIVE mg/dL   Hgb urine dipstick NEGATIVE NEGATIVE   Bilirubin Urine NEGATIVE NEGATIVE   Ketones, ur 20 (A) NEGATIVE mg/dL   Protein, ur NEGATIVE NEGATIVE mg/dL   Nitrite NEGATIVE NEGATIVE   Leukocytes, UA NEGATIVE NEGATIVE   RBC / HPF 0-5 0 - 5 RBC/hpf   WBC, UA 0-5 0 - 5 WBC/hpf   Bacteria, UA NONE SEEN NONE SEEN   Squamous Epithelial / LPF NONE SEEN NONE SEEN  CBG monitoring, ED  Result Value Ref Range   Glucose-Capillary 427 (H) 65 - 99 mg/dL  CBG monitoring, ED  Result Value Ref Range   Glucose-Capillary 358 (H) 65 - 99 mg/dL   Ct Abdomen Pelvis Wo Contrast Result Date: 02/18/2017 CLINICAL DATA:  Nausea. EXAM: CT ABDOMEN AND PELVIS WITHOUT CONTRAST TECHNIQUE: Multidetector CT imaging of the abdomen and pelvis was performed following the standard protocol without IV contrast. COMPARISON:  Abdominal MRI 09/03/2009. FINDINGS: Lower chest:  Unremarkable. Hepatobiliary: No focal abnormality in the liver on this study without intravenous contrast. There is no evidence for gallstones, gallbladder wall thickening,  or pericholecystic fluid. No intrahepatic or extrahepatic biliary dilation. Pancreas: 16 mm hypoattenuating lesion is identified in the tail of the pancreas  (see image 19 series 2 and image 40 series 5). No dilatation of the main duct. Spleen: No splenomegaly. No focal mass lesion. Adrenals/Urinary Tract: No adrenal nodule or mass. Kidneys are unremarkable. No evidence for hydroureter. The urinary bladder appears normal for the degree of distention. Stomach/Bowel: Stomach is nondistended. No gastric wall thickening. No evidence of outlet obstruction. Duodenum is normally positioned as is the ligament of Treitz. No small bowel wall thickening. No small bowel dilatation. The terminal ileum is normal. The appendix is normal. No gross colonic mass. No colonic wall thickening. No substantial diverticular change. Vascular/Lymphatic: There is abdominal aortic atherosclerosis without aneurysm. There is no gastrohepatic or hepatoduodenal ligament lymphadenopathy. No intraperitoneal or retroperitoneal lymphadenopathy. No pelvic sidewall lymphadenopathy. 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 Reproductive: Uterus surgically absent.  There is no adnexal mass. Other: No intraperitoneal free fluid. Musculoskeletal: Bone windows reveal no worrisome lytic or sclerotic osseous lesions. IMPRESSION: 1. No acute findings in the abdomen or pelvis. 2. 16 mm hypoattenuating/cystic lesion is identified in the pancreatic tail. This has decreased since 2011 when it measured up to 3.2 cm. 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 The interval decrease in size is reassuring. 3.  Aortic Atherosclerois (ICD10-170.0) 4. 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 Follow-up CTA and 12 months recommended to ensure stability. Electronically Signed   By: Misty Stanley M.D.   On: 02/18/2017 14:41   Dg Lumbar Spine Complete Result Date: 02/18/2017 CLINICAL DATA:  Left third toe numbness. EXAM: LUMBAR SPINE - COMPLETE 4+ VIEW COMPARISON:  06/13/2013 FINDINGS: Focal L4-5 disc degeneration with moderate narrowing and spurring.  Elsewhere, there is mild disc narrowing. This pattern was also seen in 2014. Borderline L2-3 and L3-4 retrolisthesis, also chronic. No evidence of fracture, endplate erosion, or focal bone lesion. IMPRESSION: 1. No acute finding. 2. Focal L4-5 disc degeneration, also noted by MRI in 2014. Electronically Signed   By: Monte Fantasia M.D.   On: 02/18/2017 09:48   Ct Cervical Spine Wo Contrast Result Date: 02/18/2017 CLINICAL DATA:  Pain and tingling in right middle finger and right toes x 1 week EXAM: CT CERVICAL SPINE WITHOUT CONTRAST TECHNIQUE: Multidetector CT imaging of the cervical spine was performed without intravenous contrast. Multiplanar CT image reconstructions were also generated. COMPARISON:  None. FINDINGS: Alignment: Minimal levoscoliosis of the upper cervical spine which may be attributable to patient positioning. Straightening of the normal cervical lordosis and slight anterolisthesis (2 mm) of C4 on C5 is likely related to mild degenerative disc disease in the mid and lower cervical spine. No evidence of acute vertebral body subluxation. Skull base and vertebrae: No fracture line or displaced fracture fragment seen. No acute or suspicious osseous lesion. Facet joints appear normally aligned. Soft tissues and spinal canal: No prevertebral fluid or swelling. No visible canal hematoma. Disc levels: Mild degenerative change at the C4-5 through C7-T1 levels, with associated disc space narrowings and mild osseous spurring. Associated mild disc-osteophytic bulges at C3-4 through C6-7 without significant central canal stenosis. Additional degenerative hypertrophy of the uncovertebral and facet joints at C4-5 and C5-6 causing causing mild neural foramen narrowings. Upper chest: Negative. Other: None. IMPRESSION: 1. Mild degenerative change within the mid and lower cervical spine, as detailed above. No evidence of significant central canal stenosis at any  level. No more than mild neural foramen narrowing at  any level. 2. No acute findings. Electronically Signed   By: Franki Cabot M.D.   On: 02/18/2017 09:26   US Abdomen Limited Result Date: 02/18/2017 CLINICAL DATA:  Abdominal pain for 1 day, elevated liver function tests EXAM: ULTRASOUND ABDOMEN LIMITED RIGHT UPPER QUADRANT COMPARISON:  None. FINDINGS: Gallbladder: The gallbladder is visualized and no gallstones are noted. There is no pain over the gallbladder with compression. Common bile duct: Diameter: The common bile duct is normal measuring 1.7 mm in diameter. Liver: The liver has a normal echogenic pattern. No focal hepatic abnormality is seen. IMPRESSION: Negative limited ultrasound of the right upper quadrant. Electronically Signed   By: Ivar Drape M.D.   On: 02/18/2017 10:53   Dg Hand Complete Left Result Date: 02/18/2017 CLINICAL DATA:  Left middle finger burning sensation. No known injury. EXAM: LEFT HAND - COMPLETE 3+ VIEW COMPARISON:  No recent prior. FINDINGS: No acute soft tissue or bony abnormality identified. Mild diffuse degenerative changes. No inflammatory arthropathy. No radiopaque foreign body. IMPRESSION: Mild diffuse degenerative changes noted. No acute bony abnormality identified. Electronically Signed   By: Marcello Moores  Register   On: 02/18/2017 09:48   Dg Foot Complete Left Result Date: 02/18/2017 CLINICAL DATA:  Left third toe numbness.  No known injury. EXAM: LEFT FOOT - COMPLETE 3+ VIEW COMPARISON:  12/12/2016 FINDINGS: Plantar calcaneal spur. No acute bony abnormality. Specifically, no fracture, subluxation, or dislocation. Soft tissues are intact. Joint spaces are maintained. IMPRESSION: No acute bony abnormality. Electronically Signed   By: Rolm Baptise M.D.   On: 02/18/2017 09:46    Results for CABRINI, RUGGIERI (MRN [id number]) as of 02/18/2017 14:49  Ref. Range 12/13/2012 16:55 11/12/2013 16:25 04/09/2016 20:54 02/18/2017 08:50  AST Latest Ref Range: 15 - 41 U/L 78 (H) 44 (H) 40 80 (H)  ALT Latest Ref Range: 14 - 54 U/L 127 (H) 70 (H)  64 (H) 100 (H)    0850:  Pt essentially asking ED RN and I to "fix everything" during this ED visit. Long d/w pt by myself and ED RN regarding role of ED in healthcare continuum, limitations of sporadic care, and ED visit expectations set. Pt verb understanding, stating she "maybe wanted too much" from her ED visit today.    1455:  Pt has tol PO well while in the ED without N/V.  No stooling while in the ED.  Pt has ambulated with steady gait. Abd remains benign, resps easy, VSS. Feels better and wants to go home now. SW/CM has given pt outpatient resources for f/u. CBG elevated, but not acidotic; trending downward with IVF. PharmD consulted regarding metformin and elevated LFT's: OK to give/no contraindications. Will give a dose here before discharge. LFT's elevated as per previous; unclear if pt f/u regarding them. Pt denies APAP or etoh use. CT scan findings are stable. No clear indication for admission at this time. Pt strongly encouraged to f/u with PMD; pt verb understanding. Dx and testing d/w pt.  Questions answered.  Verb understanding, agreeable to d/c home with outpt f/u.     Final Clinical Impressions(s) / ED Diagnoses   Final diagnoses:  None    New Prescriptions New Prescriptions   No medications on file     Francine Graven, DO 02/22/17 1240

## 2017-02-18 NOTE — ED Notes (Signed)
Social work at bedside, provided this rn with resources for primary doctor's office's in this area for the patient.

## 2017-02-18 NOTE — ED Notes (Signed)
Pt only took a couple of sips of water, nausea is "no more than usual" per pt, encouraged pt to drink more to assess if she is able to tolerate fluids.  Will reassess

## 2017-02-18 NOTE — Care Management (Signed)
SW consult placed for CM needs. Pt hs not been taking DM meds. She is employed has, insurance but no PCP. She was referred to [facility] last year, she reports going for a while and then stopping when she started working. Pt will be provided list of PCP's and understands she is responsible for contacting offices to establish care. She will be prescribed 30 day supply of DM medications from ED.

## 2017-05-30 ENCOUNTER — Encounter (HOSPITAL_COMMUNITY): Payer: Self-pay | Admitting: Emergency Medicine

## 2017-05-30 ENCOUNTER — Other Ambulatory Visit: Payer: Self-pay

## 2017-05-30 ENCOUNTER — Emergency Department (HOSPITAL_COMMUNITY)
Admission: EM | Admit: 2017-05-30 | Discharge: 2017-05-30 | Disposition: A | Discharge status: Home / Self Care | Payer: BLUE CROSS/BLUE SHIELD | Attending: Emergency Medicine | Admitting: Emergency Medicine

## 2017-05-30 DIAGNOSIS — I10 Essential (primary) hypertension: Secondary | ICD-10-CM | POA: Insufficient documentation

## 2017-05-30 DIAGNOSIS — R55 Syncope and collapse: Secondary | ICD-10-CM

## 2017-05-30 DIAGNOSIS — E108 Type 1 diabetes mellitus with unspecified complications: Secondary | ICD-10-CM | POA: Diagnosis not present

## 2017-05-30 DIAGNOSIS — Z79899 Other long term (current) drug therapy: Secondary | ICD-10-CM | POA: Diagnosis not present

## 2017-05-30 DIAGNOSIS — Z7984 Long term (current) use of oral hypoglycemic drugs: Secondary | ICD-10-CM | POA: Insufficient documentation

## 2017-05-30 DIAGNOSIS — R531 Weakness: Secondary | ICD-10-CM | POA: Diagnosis not present

## 2017-05-30 DIAGNOSIS — E1165 Type 2 diabetes mellitus with hyperglycemia: Secondary | ICD-10-CM | POA: Diagnosis not present

## 2017-05-30 DIAGNOSIS — G8929 Other chronic pain: Secondary | ICD-10-CM | POA: Diagnosis not present

## 2017-05-30 DIAGNOSIS — R739 Hyperglycemia, unspecified: Secondary | ICD-10-CM | POA: Diagnosis not present

## 2017-05-30 DIAGNOSIS — Z87891 Personal history of nicotine dependence: Secondary | ICD-10-CM | POA: Insufficient documentation

## 2017-05-30 LAB — URINALYSIS, ROUTINE W REFLEX MICROSCOPIC
Bacteria, UA: NONE SEEN
Bilirubin Urine: NEGATIVE
Glucose, UA: >=500 mg/dL — AB
Hgb urine dipstick: NEGATIVE
Ketones, ur: 80 mg/dL — AB
Leukocytes, UA: NEGATIVE
Nitrite: NEGATIVE
Protein, ur: NEGATIVE mg/dL
Specific Gravity, Urine: 1.029 (ref 1.005–1.030)
Squamous Epithelial / LPF: NONE SEEN
pH: 6 (ref 5.0–8.0)

## 2017-05-30 LAB — BASIC METABOLIC PANEL
Anion gap: 12 (ref 5–15)
BUN: 7 mg/dL (ref 6–20)
CO2: 20 mmol/L — ABNORMAL LOW (ref 22–32)
Calcium: 9 mg/dL (ref 8.9–10.3)
Chloride: 99 mmol/L — ABNORMAL LOW (ref 101–111)
Creatinine, Ser: 0.69 mg/dL (ref 0.44–1.00)
GFR calc Af Amer: >60 mL/min (ref >60)
GFR calc non Af Amer: >60 mL/min (ref >60)
Glucose, Bld: 461 mg/dL — ABNORMAL HIGH (ref 65–99)
Potassium: 4.4 mmol/L (ref 3.5–5.1)
Sodium: 131 mmol/L — ABNORMAL LOW (ref 135–145)

## 2017-05-30 LAB — CBC
HCT: 39.8 % (ref 36.0–46.0)
Hemoglobin: 14.3 g/dL (ref 12.0–15.0)
MCH: 32.2 pg (ref 26.0–34.0)
MCHC: 35.9 g/dL (ref 30.0–36.0)
MCV: 89.6 fL (ref 78.0–100.0)
Platelets: 212 10*3/uL (ref 150–400)
RBC: 4.44 MIL/uL (ref 3.87–5.11)
RDW: 11.9 % (ref 11.5–15.5)
WBC: 6.9 10*3/uL (ref 4.0–10.5)

## 2017-05-30 LAB — CBG MONITORING, ED
Glucose-Capillary: 430 mg/dL — ABNORMAL HIGH (ref 65–99)
Glucose-Capillary: 248 mg/dL — ABNORMAL HIGH (ref 65–99)

## 2017-05-30 MED ORDER — METFORMIN HCL 500 MG PO TABS: 1000.0000 mg | ORAL_TABLET | Freq: Two times a day (BID) | ORAL | 0 refills | Status: DC

## 2017-05-30 MED ORDER — ACETAMINOPHEN 325 MG PO TABS
650.0000 mg | ORAL_TABLET | Freq: Once | ORAL | Status: AC
  Administered 2017-05-30: 650 mg via ORAL
  Filled 2017-05-30: qty 2

## 2017-05-30 MED ORDER — VENLAFAXINE HCL ER 75 MG PO CP24: 75.0000 mg | ORAL_CAPSULE | Freq: Every day | ORAL | 0 refills | Status: DC

## 2017-05-30 MED ORDER — MELOXICAM 15 MG PO TABS: 15.0000 mg | ORAL_TABLET | Freq: Every day | ORAL | 0 refills | Status: DC

## 2017-05-30 MED ORDER — SODIUM CHLORIDE 0.9 % IV BOLUS (SEPSIS)
1500.0000 mL | Freq: Once | INTRAVENOUS | Status: AC
  Administered 2017-05-30: 1500 mL via INTRAVENOUS

## 2017-05-30 NOTE — Discharge Instructions (Signed)
We are changing your metformin prescription to 2 tablets twice a day. Please check your sugars more frequently. You need to see a primary care doctor as soon as possible and your insurance company should have a list of doctors who take your insurance available on their website by calling them.  You may take mobic for pain. You can only take tylenol with this medicine.

## 2017-05-30 NOTE — ED Triage Notes (Signed)
To ED via [facility] from work (at [employer]) with pt c/o leg weakness, and felt like everything was "going white" when walking down the hall. Pt has a hx of NIDDM, on Metformin, but says glucose has been up and down. Pt is alert/oriented x 4. W/d,

## 2017-05-30 NOTE — ED Provider Notes (Signed)
[place] EMERGENCY DEPARTMENT Provider Note   CSN: [id number] Arrival date & time: 05/30/17  1037     History   Chief Complaint Chief Complaint  Patient presents with  . Hyperglycemia  . Weakness    HPI [name] [name] is a 56 y.o. female who presents the emergency department with chief complaint of presyncope and hyperglycemia.  Patient states that she was at work today and she got extremely hot.  She took off her turtleneck and sat down in their break room.  She states she began to have tunnel vision and muffled hearing.  She states that she became a little bit sweaty but denies chest pain, shortness of breath, nausea.  She denies any tachycardia.  She took her blood sugar and saw that it was extremely elevated.  She says she normally runs about a 120 and it was above 600.  Patient became extremely anxious and came to the emergency department.  She does not have established primary care at this time.  She takes metformin only.  She is underlying gum disease which is being treated by a dentist at this time.  She denies any new dental abscesses, new pain, fevers, chills.  She has no previous history of syncope.  HPI  Past Medical History:  Diagnosis Date  . Cervical disc disorder with radiculopathy of cervical region   . Chest pain   . Chronic low back pain    HNP  . Depression dx 1997  . Depression   . Diabetes mellitus, type 2 ([place]) 2011   No insulin  . GERD (gastroesophageal reflux disease)   . Glaucoma   . Headache(784.0)   . Herniated disc   . Hypertension    Lab 11/2011:  CXR, EKG, CBC, TSH, BMet, troponin-normal; lipid profile: 188, 131, 36, 126   . Non-compliance   . Pancreatic cyst    Endoscopic aspiration in 09/2009  . Pneumonia 08/02/2012  . Shingles   . Vertigo     Patient Active Problem List   Diagnosis Date Noted  . Vertigo 04/09/2016  . Acute sinusitis 06/27/2015  . Cervical disc disorder with radiculopathy of cervical region 02/20/2015   . Neck muscle spasm 10/23/2014  . Healthcare maintenance 10/23/2014  . Depression 11/25/2013  . Diabetes type 2, uncontrolled ([place])     Past Surgical History:  Procedure Laterality Date  . ABDOMINAL HYSTERECTOMY    . CESAREAN SECTION      OB History    No data available       Home Medications    Prior to Admission medications   Medication Sig Start Date End Date Taking? Authorizing Provider  Hypromellose (ARTIFICIAL TEARS OP) Place 1 drop daily as needed into both eyes (dry eyes).   Yes [provider]  ibuprofen (ADVIL,MOTRIN) 200 MG tablet Take 200 mg every 6 (six) hours as needed by mouth for moderate pain.   Yes [provider]  cephALEXin (KEFLEX) 500 MG capsule Take 1 capsule (500 mg total) by mouth 4 (four) times daily. Patient not taking: Reported on 02/18/2017 12/12/16   Joline Maxcy A, PA-C  meloxicam (MOBIC) 15 MG tablet Take 1 tablet (15 mg total) daily by mouth. 05/30/17   Margarita Mail, PA-C  metFORMIN (GLUCOPHAGE) 500 MG tablet Take 2 tablets (1,000 mg total) 2 (two) times daily with a meal by mouth. 05/30/17   Margarita Mail, PA-C  metoCLOPramide (REGLAN) 10 MG tablet Take 1 tablet (10 mg total) by mouth every 8 (eight) hours as  needed for nausea. Patient not taking: Reported on 05/30/2017 02/18/17   Francine Graven, DO  venlafaxine XR (EFFEXOR XR) 75 MG 24 hr capsule Take 1 capsule (75 mg total) daily with breakfast by mouth. 05/30/17   Margarita Mail, PA-C    Family History Family History  Problem Relation Age of Onset  . Depression Mother        type 1    Social History Social History   Tobacco Use  . Smoking status: Former Smoker    Last attempt to quit: 08/03/1978    Years since quitting: 38.8  . Smokeless tobacco: Never Used  Substance Use Topics  . Alcohol use: No    Comment: Former  . Drug use: No     Allergies   Tramadol   Review of Systems Review of Systems  Ten systems reviewed and are negative for acute  change, except as noted in the HPI.   Physical Exam Updated Vital Signs BP 119/89 (BP Location: Right Arm) Comment: Simultaneous filing. User may not have seen previous data.  Pulse 97 Comment: Simultaneous filing. User may not have seen previous data.  Temp 97.9 F (36.6 C) (Oral)   Resp 18 Comment: Simultaneous filing. User may not have seen previous data.  Ht 5\' 6"  (1.676 m)   Wt 60.3 kg (133 lb)   SpO2 99% Comment: Simultaneous filing. User may not have seen previous data.  BMI 21.47 kg/m   Physical Exam  Constitutional: She is oriented to person, place, and time. She appears well-developed and well-nourished. No distress.  HENT:  Head: Normocephalic and atraumatic.  Eyes: Conjunctivae and EOM are normal. Pupils are equal, round, and reactive to light. No scleral icterus.  Neck: Normal range of motion.  Cardiovascular: Normal rate, regular rhythm and normal heart sounds. Exam reveals no gallop and no friction rub.  No murmur heard. Pulmonary/Chest: Effort normal and breath sounds normal. No respiratory distress.  Abdominal: Soft. Bowel sounds are normal. She exhibits no distension and no mass. There is no tenderness. There is no guarding.  Neurological: She is alert and oriented to person, place, and time.  Skin: Skin is warm and dry. She is not diaphoretic.  Psychiatric: Her behavior is normal.  Nursing note and vitals reviewed.    ED Treatments / Results  Labs (all labs ordered are listed, but only abnormal results are displayed) Labs Reviewed  BASIC METABOLIC PANEL - Abnormal; Notable for the following components:      Result Value   Sodium 131 (*)    Chloride 99 (*)    CO2 20 (*)    Glucose, Bld 461 (*)    All other components within normal limits  URINALYSIS, ROUTINE W REFLEX MICROSCOPIC - Abnormal; Notable for the following components:   Color, Urine STRAW (*)    Glucose, UA >=500 (*)    Ketones, ur 80 (*)    All other components within normal limits  CBG  MONITORING, ED - Abnormal; Notable for the following components:   Glucose-Capillary 430 (*)    All other components within normal limits  CBG MONITORING, ED - Abnormal; Notable for the following components:   Glucose-Capillary 248 (*)    All other components within normal limits  CBC    EKG  EKG Interpretation  Date/Time:  Sunday May 30 2017 10:50:07 EST Ventricular Rate:  88 PR Interval:    QRS Duration: 81 QT Interval:  382 QTC Calculation: 463 R Axis:   84 Text Interpretation:  Sinus rhythm  Baseline wander in lead(s) II III aVF No significant change since last tracing Confirmed by Gareth Morgan [phone]) on 05/30/2017 10:57:09 AM Also confirmed by Gareth Morgan [phone]), editor Laurena Spies [phone])  on 05/30/2017 11:05:24 AM       Radiology No results found.  Procedures Procedures (including critical care time)  Medications Ordered in ED Medications  sodium chloride 0.9 % bolus 1,500 mL (0 mLs Intravenous Stopped 05/30/17 1349)  acetaminophen (TYLENOL) tablet 650 mg (650 mg Oral Given 05/30/17 1551)     Initial Impression / Assessment and Plan / ED Course  I have reviewed the triage vital signs and the nursing notes.  Pertinent labs & imaging results that were available during my care of the patient were reviewed by me and considered in my medical decision making (see chart for details).     Patient with positive orthostatic vital signs.  She has been given fluid resuscitation.  Her EKG is without abnormal finding. I doubt cardiac etiology of her presyncope.  She appears to have some hypovolemia secondary to dehydration.  Patient given IV fluids.  She feels improved.  She has no dizziness or orthostatic symptoms with standing at this time.  Her blood sugar is improved.  She is encouraged to find PCP this week.  Patient may have ongoing hyper glycemia causing dehydration and we have changed her metformin dose.  Patient discussed with Dr. Billy Fischer who  agrees with plan of care.  She appears appropriate for discharge at this time  Final Clinical Impressions(s) / ED Diagnoses   Final diagnoses:  Hyperglycemia  Near syncope  Other chronic pain    ED Discharge Orders        Ordered    metFORMIN (GLUCOPHAGE) 500 MG tablet  2 times daily with meals     05/30/17 1409    venlafaxine XR (EFFEXOR XR) 75 MG 24 hr capsule  Daily with breakfast     05/30/17 1409    meloxicam (MOBIC) 15 MG tablet  Daily     05/30/17 1415       Margarita Mail, PA-C 05/30/17 1753    Gareth Morgan, MD 05/30/17 2149

## 2017-07-19 ENCOUNTER — Emergency Department (HOSPITAL_COMMUNITY)
Admission: EM | Admit: 2017-07-19 | Discharge: 2017-07-19 | Disposition: A | Discharge status: Home / Self Care | Payer: BLUE CROSS/BLUE SHIELD | Attending: Emergency Medicine | Admitting: Emergency Medicine

## 2017-07-19 ENCOUNTER — Encounter (HOSPITAL_COMMUNITY): Payer: Self-pay

## 2017-07-19 DIAGNOSIS — G8929 Other chronic pain: Secondary | ICD-10-CM | POA: Insufficient documentation

## 2017-07-19 DIAGNOSIS — E119 Type 2 diabetes mellitus without complications: Secondary | ICD-10-CM | POA: Diagnosis not present

## 2017-07-19 DIAGNOSIS — I10 Essential (primary) hypertension: Secondary | ICD-10-CM | POA: Diagnosis not present

## 2017-07-19 DIAGNOSIS — Z87891 Personal history of nicotine dependence: Secondary | ICD-10-CM | POA: Diagnosis not present

## 2017-07-19 DIAGNOSIS — Z79899 Other long term (current) drug therapy: Secondary | ICD-10-CM | POA: Diagnosis not present

## 2017-07-19 DIAGNOSIS — E1165 Type 2 diabetes mellitus with hyperglycemia: Secondary | ICD-10-CM

## 2017-07-19 DIAGNOSIS — M791 Myalgia, unspecified site: Secondary | ICD-10-CM | POA: Diagnosis not present

## 2017-07-19 DIAGNOSIS — R110 Nausea: Secondary | ICD-10-CM | POA: Insufficient documentation

## 2017-07-19 DIAGNOSIS — M7918 Myalgia, other site: Secondary | ICD-10-CM | POA: Diagnosis not present

## 2017-07-19 DIAGNOSIS — E860 Dehydration: Secondary | ICD-10-CM

## 2017-07-19 LAB — URINALYSIS, ROUTINE W REFLEX MICROSCOPIC
Bacteria, UA: NONE SEEN
Bilirubin Urine: NEGATIVE
Glucose, UA: >=500 mg/dL — AB
Hgb urine dipstick: NEGATIVE
Ketones, ur: 20 mg/dL — AB
Leukocytes, UA: NEGATIVE
Nitrite: NEGATIVE
Protein, ur: NEGATIVE mg/dL
Specific Gravity, Urine: 1.038 — ABNORMAL HIGH (ref 1.005–1.030)
Squamous Epithelial / LPF: NONE SEEN
pH: 5 (ref 5.0–8.0)

## 2017-07-19 LAB — LIPASE, BLOOD: Lipase: 71 U/L — ABNORMAL HIGH (ref 11–51)

## 2017-07-19 LAB — CBC WITH DIFFERENTIAL/PLATELET
Basophils Absolute: 0 10*3/uL (ref 0.0–0.1)
Basophils Relative: 0 %
Eosinophils Absolute: 0.1 10*3/uL (ref 0.0–0.7)
Eosinophils Relative: 2 %
HCT: 43.7 % (ref 36.0–46.0)
Hemoglobin: 15 g/dL (ref 12.0–15.0)
Lymphocytes Relative: 29 %
Lymphs Abs: 1.9 10*3/uL (ref 0.7–4.0)
MCH: 31.3 pg (ref 26.0–34.0)
MCHC: 34.3 g/dL (ref 30.0–36.0)
MCV: 91.2 fL (ref 78.0–100.0)
Monocytes Absolute: 0.4 10*3/uL (ref 0.1–1.0)
Monocytes Relative: 7 %
Neutro Abs: 4.1 10*3/uL (ref 1.7–7.7)
Neutrophils Relative %: 62 %
Platelets: 217 10*3/uL (ref 150–400)
RBC: 4.79 MIL/uL (ref 3.87–5.11)
RDW: 11.9 % (ref 11.5–15.5)
WBC: 6.6 10*3/uL (ref 4.0–10.5)

## 2017-07-19 LAB — COMPREHENSIVE METABOLIC PANEL
ALT: 52 U/L (ref 14–54)
AST: 33 U/L (ref 15–41)
Albumin: 3.8 g/dL (ref 3.5–5.0)
Alkaline Phosphatase: 118 U/L (ref 38–126)
Anion gap: 12 (ref 5–15)
BUN: 13 mg/dL (ref 6–20)
CO2: 24 mmol/L (ref 22–32)
Calcium: 9.3 mg/dL (ref 8.9–10.3)
Chloride: 96 mmol/L — ABNORMAL LOW (ref 101–111)
Creatinine, Ser: 0.57 mg/dL (ref 0.44–1.00)
GFR calc Af Amer: >60 mL/min (ref >60)
GFR calc non Af Amer: >60 mL/min (ref >60)
Glucose, Bld: 397 mg/dL — ABNORMAL HIGH (ref 65–99)
Potassium: 4.4 mmol/L (ref 3.5–5.1)
Sodium: 132 mmol/L — ABNORMAL LOW (ref 135–145)
Total Bilirubin: 0.7 mg/dL (ref 0.3–1.2)
Total Protein: 7.4 g/dL (ref 6.5–8.1)

## 2017-07-19 LAB — CBG MONITORING, ED: Glucose-Capillary: 379 mg/dL — ABNORMAL HIGH (ref 65–99)

## 2017-07-19 LAB — CK: Total CK: 27 U/L — ABNORMAL LOW (ref 38–234)

## 2017-07-19 MED ORDER — MORPHINE SULFATE (PF) 4 MG/ML IV SOLN
4.0000 mg | Freq: Once | INTRAVENOUS | Status: AC
  Administered 2017-07-19: 4 mg via INTRAVENOUS
  Filled 2017-07-19: qty 1

## 2017-07-19 MED ORDER — SODIUM CHLORIDE 0.9 % IV BOLUS (SEPSIS)
1000.0000 mL | Freq: Once | INTRAVENOUS | Status: AC
  Administered 2017-07-19: 1000 mL via INTRAVENOUS

## 2017-07-19 MED ORDER — METFORMIN HCL 500 MG PO TABS: 1000.0000 mg | ORAL_TABLET | Freq: Two times a day (BID) | ORAL | 0 refills | Status: DC

## 2017-07-19 MED ORDER — METOCLOPRAMIDE HCL 10 MG PO TABS: 10.0000 mg | ORAL_TABLET | Freq: Three times a day (TID) | ORAL | 0 refills | Status: DC | PRN

## 2017-07-19 MED ORDER — ONDANSETRON HCL 4 MG/2ML IJ SOLN
4.0000 mg | Freq: Once | INTRAMUSCULAR | Status: AC
  Administered 2017-07-19: 4 mg via INTRAVENOUS
  Filled 2017-07-19: qty 2

## 2017-07-19 NOTE — ED Notes (Signed)
Pt states she cannot urinate at this time. Informed pt we need a specimen & as soon as she could, please provide [place] with one.

## 2017-07-19 NOTE — ED Provider Notes (Signed)
[facility] EMERGENCY DEPARTMENT Provider Note   CSN: [id number] Arrival date & time: 07/19/17  0557     History   Chief Complaint Chief Complaint  Patient presents with  . Generalized Body Aches    HPI [name] [name] is a 56 y.o. female.  Pt presents to the ED today with pain all over her body.  The pt said she has had the pain for months.  She does not have a pcp, but has been to the ED in the past for generalized pain.  Pt said the pain is so bad that she gets nauseous.  She didn't take metformin this morning b/c it makes her nauseous if she does not eat and she was too nauseous to eat today.  The pt can't localize any pain.  She can't tell if it is joint or muscle.  Nonspecific.  No fever. No vomiting.      Past Medical History:  Diagnosis Date  . Cervical disc disorder with radiculopathy of cervical region   . Chest pain   . Chronic low back pain    HNP  . Depression dx 1997  . Depression   . Diabetes mellitus, type 2 ([place]) 2011   No insulin  . GERD (gastroesophageal reflux disease)   . Glaucoma   . Headache(784.0)   . Herniated disc   . Hypertension    Lab 11/2011:  CXR, EKG, CBC, TSH, BMet, troponin-normal; lipid profile: 188, 131, 36, 126   . Non-compliance   . Pancreatic cyst    Endoscopic aspiration in 09/2009  . Pneumonia 08/02/2012  . Shingles   . Vertigo     Patient Active Problem List   Diagnosis Date Noted  . Vertigo 04/09/2016  . Acute sinusitis 06/27/2015  . Cervical disc disorder with radiculopathy of cervical region 02/20/2015  . Neck muscle spasm 10/23/2014  . Healthcare maintenance 10/23/2014  . Depression 11/25/2013  . Diabetes type 2, uncontrolled ([place])     Past Surgical History:  Procedure Laterality Date  . ABDOMINAL HYSTERECTOMY    . CESAREAN SECTION      OB History    No data available       Home Medications    Prior to Admission medications   Medication Sig Start Date End Date Taking? Authorizing Provider  cephALEXin  (KEFLEX) 500 MG capsule Take 1 capsule (500 mg total) by mouth 4 (four) times daily. Patient not taking: Reported on 02/18/2017 12/12/16   McDonald, Mia A, PA-C  Hypromellose (ARTIFICIAL TEARS OP) Place 1 drop daily as needed into both eyes (dry eyes).    [provider]  ibuprofen (ADVIL,MOTRIN) 200 MG tablet Take 200 mg every 6 (six) hours as needed by mouth for moderate pain.    [provider]  meloxicam (MOBIC) 15 MG tablet Take 1 tablet (15 mg total) daily by mouth. 05/30/17   Margarita Mail, PA-C  metFORMIN (GLUCOPHAGE) 500 MG tablet Take 2 tablets (1,000 mg total) by mouth 2 (two) times daily with a meal. 07/19/17   Isla Pence, MD  metoCLOPramide (REGLAN) 10 MG tablet Take 1 tablet (10 mg total) by mouth every 8 (eight) hours as needed for nausea. 07/19/17   Isla Pence, MD  venlafaxine XR (EFFEXOR XR) 75 MG 24 hr capsule Take 1 capsule (75 mg total) daily with breakfast by mouth. 05/30/17   Margarita Mail, PA-C    Family History Family History  Problem Relation Age of Onset  . Depression Mother  type 1    Social History Social History   Tobacco Use  . Smoking status: Former Smoker    Last attempt to quit: 08/03/1978    Years since quitting: 38.9  . Smokeless tobacco: Never Used  Substance Use Topics  . Alcohol use: No    Comment: Former  . Drug use: No     Allergies   Tramadol   Review of Systems Review of Systems  Musculoskeletal: Positive for arthralgias and myalgias.  All other systems reviewed and are negative.    Physical Exam Updated Vital Signs BP (!) 98/54 (BP Location: Right Arm)   Pulse (!) 113   Temp 98.4 F (36.9 C) (Oral)   Resp 20   Ht 5\' 6"  (1.676 m)   Wt 63.5 kg (140 lb)   SpO2 96%   BMI 22.60 kg/m   Physical Exam  Constitutional: She is oriented to person, place, and time. She appears well-developed and well-nourished.  HENT:  Head: Normocephalic and atraumatic.  Right Ear: External ear normal.  Left  Ear: External ear normal.  Nose: Nose normal.  Mouth/Throat: Oropharynx is clear and moist.  Eyes: Conjunctivae and EOM are normal. Pupils are equal, round, and reactive to light.  Neck: Normal range of motion. Neck supple.  Cardiovascular: Regular rhythm, normal heart sounds and intact distal pulses. Tachycardia present.  Pulmonary/Chest: Effort normal and breath sounds normal.  Abdominal: Soft. Bowel sounds are normal.  Musculoskeletal: Normal range of motion.  Neurological: She is alert and oriented to person, place, and time.  Skin: Skin is warm and dry. Capillary refill takes less than 2 seconds.  Psychiatric: She has a normal mood and affect. Her behavior is normal. Judgment and thought content normal.  Nursing note and vitals reviewed.    ED Treatments / Results  Labs (all labs ordered are listed, but only abnormal results are displayed) Labs Reviewed  URINALYSIS, ROUTINE W REFLEX MICROSCOPIC - Abnormal; Notable for the following components:      Result Value   Specific Gravity, Urine 1.038 (*)    Glucose, UA >=500 (*)    Ketones, ur 20 (*)    All other components within normal limits  COMPREHENSIVE METABOLIC PANEL - Abnormal; Notable for the following components:   Sodium 132 (*)    Chloride 96 (*)    Glucose, Bld 397 (*)    All other components within normal limits  LIPASE, BLOOD - Abnormal; Notable for the following components:   Lipase 71 (*)    All other components within normal limits  CK - Abnormal; Notable for the following components:   Total CK 27 (*)    All other components within normal limits  CBG MONITORING, ED - Abnormal; Notable for the following components:   Glucose-Capillary 379 (*)    All other components within normal limits  CBC WITH DIFFERENTIAL/PLATELET    EKG  EKG Interpretation  Date/Time:  Monday July 19 2017 07:52:50 EST Ventricular Rate:  100 PR Interval:    QRS Duration: 81 QT Interval:  351 QTC Calculation: 453 R  Axis:   92 Text Interpretation:  Sinus tachycardia Atrial premature complex Borderline right axis deviation Baseline wander in lead(s) V3 Confirmed by Isla Pence [phone]) on 07/19/2017 7:55:15 AM       Radiology No results found.  Procedures Procedures (including critical care time)  Medications Ordered in ED Medications  sodium chloride 0.9 % bolus 1,000 mL (1,000 mLs Intravenous New Bag/Given 07/19/17 0800)  ondansetron (ZOFRAN) injection 4 mg (  4 mg Intravenous Given 07/19/17 0753)  morphine 4 MG/ML injection 4 mg (4 mg Intravenous Given 07/19/17 0754)     Initial Impression / Assessment and Plan / ED Course  I have reviewed the triage vital signs and the nursing notes.  Pertinent labs & imaging results that were available during my care of the patient were reviewed by me and considered in my medical decision making (see chart for details).     Pt is feeling much better.  She is dehydrated, likely from her hyperglycemia.  I refilled her rx for metformin.  She has paperwork to fill out regarding being a patient in the [facility] resident clinic.  She is encouraged to do so and establish pcp.  She knows to return if worse.    Final Clinical Impressions(s) / ED Diagnoses   Final diagnoses:  Myalgia  Poorly controlled type 2 diabetes mellitus ([place])  Dehydration  Nausea    ED Discharge Orders        Ordered    metFORMIN (GLUCOPHAGE) 500 MG tablet  2 times daily with meals     07/19/17 0902    metoCLOPramide (REGLAN) 10 MG tablet  Every 8 hours PRN     07/19/17 0902       Isla Pence, MD 07/19/17 [phone]

## 2017-07-19 NOTE — ED Triage Notes (Signed)
Pt reports stabbing pain all over for the past couple of months.  Reports pain is worse at night.  Reports nausea, no vomiting.

## 2017-08-18 ENCOUNTER — Encounter: Payer: Self-pay | Admitting: Family Medicine

## 2017-08-18 ENCOUNTER — Other Ambulatory Visit: Payer: Self-pay

## 2017-08-18 ENCOUNTER — Ambulatory Visit: Payer: BLUE CROSS/BLUE SHIELD | Admitting: Family Medicine

## 2017-08-18 VITALS — BP 110/72 | HR 85 | Temp 98.0°F | Ht 66.0 in | Wt 135.8 lb

## 2017-08-18 DIAGNOSIS — E1165 Type 2 diabetes mellitus with hyperglycemia: Secondary | ICD-10-CM | POA: Diagnosis not present

## 2017-08-18 DIAGNOSIS — G894 Chronic pain syndrome: Secondary | ICD-10-CM

## 2017-08-18 DIAGNOSIS — G2581 Restless legs syndrome: Secondary | ICD-10-CM

## 2017-08-18 DIAGNOSIS — E1142 Type 2 diabetes mellitus with diabetic polyneuropathy: Secondary | ICD-10-CM | POA: Diagnosis not present

## 2017-08-18 LAB — POCT GLYCOSYLATED HEMOGLOBIN (HGB A1C): Hemoglobin A1C: >15

## 2017-08-18 MED ORDER — IBUPROFEN 600 MG PO TABS: 600.0000 mg | ORAL_TABLET | Freq: Three times a day (TID) | ORAL | 2 refills | Status: DC | PRN

## 2017-08-18 MED ORDER — GLUCOSE BLOOD VI STRP: ORAL_STRIP | 12 refills | Status: DC

## 2017-08-18 MED ORDER — METFORMIN HCL 500 MG PO TABS: 1000.0000 mg | ORAL_TABLET | Freq: Two times a day (BID) | ORAL | 0 refills | Status: DC

## 2017-08-18 MED ORDER — INSULIN GLARGINE 100 UNIT/ML SOLOSTAR PEN: 10.0000 [IU] | PEN_INJECTOR | Freq: Every day | SUBCUTANEOUS | 99 refills | Status: DC

## 2017-08-18 MED ORDER — GABAPENTIN 100 MG PO CAPS: 100.0000 mg | ORAL_CAPSULE | Freq: Three times a day (TID) | ORAL | 3 refills | Status: DC

## 2017-08-18 MED ORDER — AMITRIPTYLINE HCL 25 MG PO TABS: 25.0000 mg | ORAL_TABLET | Freq: Every day | ORAL | 2 refills | Status: DC

## 2017-08-18 MED ORDER — ACETAMINOPHEN 325 MG PO TABS: 650.0000 mg | ORAL_TABLET | Freq: Four times a day (QID) | ORAL | 2 refills | Status: DC | PRN

## 2017-08-18 NOTE — Patient Instructions (Addendum)
It was great to meet you today! Thank you for letting me participate in your care!  Today, we discussed your pain and your T2DM.  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 I have prescribed several medications for you to take that should help with that. Regular exercise should also help as well. Please do 58min of moderate exercise at least 5 times per week. Please take the medications as prescribed.  For you T2DM you will restart Metformin 100mg  BID and I will start you on insulin. Please start checking your blood sugar in the morning before eating and at night.  Please insure you drink plenty of fluids to make sure you do not get dehydrated.  Be well, Harolyn Rutherford, DO PGY-1, [facility]

## 2017-08-18 NOTE — Progress Notes (Signed)
Subjective: Chief Complaint  Patient presents with  . Establish Care     HPI: [name] [name] is a 57 y.o. presenting to clinic today to discuss the following:  Establish Care - here to establish care with PCP. She has a PMH of depression, chronic pain, diabetes, and migraines.  Chronic Body Pain: Patient states that in Oct of 2018 she fell down her steps at home and started having pain in her ankles. This pain slowly migrated up her legs and into her knees. Now, she states she has pain "all over". She has back pain that is worse at night and the pains are intermittent and seem to randomly migrate "all around my body". Pain is rated at a 10/10 at the worst and described as stabbing pain. She also describes involuntary leg movement at night and bilateral foot numbness and tingling. She has also begun to feel weak in her legs when she walks that gets better with rest. Pain can last from hours to days.  Diabetes: Patient states over the past 3 weeks she has not been taking any DM medications. She was diagnosed with T2DM "years ago" and did well managing it with Metformin, diet, and exercise. As of late patient admits to eating "lots of sweets and drinking lots of Coke".She recently stopped taking Metformin due to becoming concerned after seeing a commercial about it stating its potential side effects. Over the past month patient endorses considerable weight loss of over 20lbs.  Review of Systems  Constitutional: Positive for malaise/fatigue and weight loss. Negative for chills, diaphoresis and fever.  HENT: Negative for congestion, hearing loss, sinus pain and sore throat.   Eyes: Negative for blurred vision and double vision.  Respiratory: Negative for cough, hemoptysis, sputum production, shortness of breath and wheezing.   Cardiovascular: Negative for chest pain, palpitations and leg swelling.  Gastrointestinal: Negative for abdominal pain, blood in stool, constipation, diarrhea, nausea and  vomiting.  Genitourinary: Negative for dysuria, frequency and urgency.  Musculoskeletal: Positive for back pain and myalgias.  Skin: Negative for itching and rash.  Neurological: Positive for tingling, sensory change and weakness. Negative for dizziness, loss of consciousness and headaches.  Endo/Heme/Allergies: Positive for polydipsia.  Psychiatric/Behavioral: Positive for depression. Negative for suicidal ideas.   Health Maintenance: none today     ROS noted in HPI.   Past Medical, Surgical, Social, and Family History Reviewed & Updated per EMR.   Pertinent Historical Findings include:   Social History   Tobacco Use  Smoking Status Former Smoker  . Last attempt to quit: 08/03/1978  . Years since quitting: 39.0  Smokeless Tobacco Never Used      Objective: BP 110/72   Pulse 85   Temp 98 F (36.7 C) (Oral)   Ht 5\' 6"  (1.676 m)   Wt 135 lb 12.8 oz (61.6 kg)   SpO2 97%   BMI 21.92 kg/m  Vitals and nursing notes reviewed  Physical Exam  Constitutional: She is oriented to person, place, and time and well-developed, well-nourished, and in no distress. No distress.  HENT:  Head: Normocephalic and atraumatic.  Right Ear: External ear normal.  Left Ear: External ear normal.  Mouth/Throat: No oropharyngeal exudate.  Eyes: Conjunctivae and EOM are normal. Pupils are equal, round, and reactive to light.  Neck: Normal range of motion. Neck supple. No thyromegaly present.  Cardiovascular: Normal rate, regular rhythm, normal heart sounds and intact distal pulses.  No murmur heard. Pulmonary/Chest: Effort normal and breath sounds normal. No  respiratory distress. She has no wheezes. She has no rales.  Abdominal: Soft. Bowel sounds are normal. She exhibits no distension. There is no tenderness. There is no rebound.  Musculoskeletal: She exhibits no edema or deformity.  Fibromyalgia tender points positive in only 6 of 14 locations  Lymphadenopathy:    She has no cervical  adenopathy.  Neurological: She is alert and oriented to person, place, and time.  Skin: Skin is warm and dry. No rash noted. No erythema.  Psychiatric: Mood and affect normal.    Results for orders placed or performed in visit on 08/18/17 (from the past 72 hour(s))  POCT glycosylated hemoglobin (Hb A1C)     Status: Abnormal   Collection Time: 08/18/17  9:49 AM  Result Value Ref Range   Hemoglobin A1C >15.0     Assessment/Plan:  Diabetes type 2, uncontrolled (HCC) HgbA1c at check today was >15.0. Counseled patient extensively on the importance of controlling her blood sugar; discussed long term health risks to not controlling her blood sugar. Gave education on diet and exercise to help but discussed with patient the need to restart medications. Patient was in agreement.  Starting 10U of Lantus each night and restarting Metformin 500mg  BID. She will begin checking her blood sugar every morning and for each morning it is above 150 she will add 1U of Lantus to her daily regimen.  I will have her come back in for follow up in one week and have her seen by pharmacy for more DM education.  I suspect with her recent weight loss that she is exhausting or has begun to exhaust her insulin that she can produce.  Patient was educated on how to be aware of hypoglycemia and what steps to take if she is found to have low blood sugar.  Diabetic polyneuropathy associated with type 2 diabetes mellitus ([place]) I suspect her pain and tingling in her legs and feet are due to polyneuropathy from uncontrolled DM.  Started her on gabapentin 100mg  TID. Will continue to monitor for improvement in managing this symptom.   Of note, she may have several issues with pain overlapping and causing this mixed presentation.  Restless leg syndrome Could also be a component of her lower extremity pain; specifically her pain and involuntary movement at night.  Gabapentin should help with this as well as her diabetic  neuropathy.  Chronic pain syndrome Unclear etiology. Could be somatization from depression and she did have an inciting physical event. She does not meet criteria for Fibromyalgia and no other symptoms at this time to suggest any rheumatological cause.  I have started her on Amitriptyline 25mg  to see if that will improve her symptoms.   Did discuss with patient exercise should improve her symptoms and recommended at least 30 mins a day 5 times per week.  Will follow up to see if she has improvement.     PATIENT EDUCATION PROVIDED: See AVS    Diagnosis and plan along with any newly prescribed medication(s) were discussed in detail with this patient today. The patient verbalized understanding and agreed with the plan. Patient advised if symptoms worsen return to clinic or ER.   Health Maintainance:   Orders Placed This Encounter  Procedures  . POCT glycosylated hemoglobin (Hb A1C)    Meds ordered this encounter  Medications  . amitriptyline (ELAVIL) 25 MG tablet    Sig: Take 1 tablet (25 mg total) by mouth at bedtime.    Dispense:  30 tablet    Refill:  2  . gabapentin (NEURONTIN) 100 MG capsule    Sig: Take 1 capsule (100 mg total) by mouth 3 (three) times daily.    Dispense:  90 capsule    Refill:  3  . ibuprofen (ADVIL,MOTRIN) 600 MG tablet    Sig: Take 1 tablet (600 mg total) by mouth every 8 (eight) hours as needed.    Dispense:  30 tablet    Refill:  2  . acetaminophen (TYLENOL) 325 MG tablet    Sig: Take 2 tablets (650 mg total) by mouth every 6 (six) hours as needed.    Dispense:  60 tablet    Refill:  2  . metFORMIN (GLUCOPHAGE) 500 MG tablet    Sig: Take 2 tablets (1,000 mg total) by mouth 2 (two) times daily with a meal.    Dispense:  60 tablet    Refill:  0  . Insulin Glargine (LANTUS SOLOSTAR) 100 UNIT/ML Solostar Pen    Sig: Inject 10 Units into the skin daily at 10 pm. Increase dose by 1Unit each day your BG is over 150 in the morning.    Dispense:  5  pen    Refill:  PRN  . glucose blood test strip    Sig: Relion Testing Strips Use as instructed    Dispense:  100 each    Refill:  Francisco, DO 08/24/2017, 9:24 PM PGY-1, [place]

## 2017-08-19 LAB — COMPREHENSIVE METABOLIC PANEL
ALT: 65 IU/L — ABNORMAL HIGH (ref 0–32)
AST: 50 IU/L — ABNORMAL HIGH (ref 0–40)
Albumin/Globulin Ratio: 1.3 (ref 1.2–2.2)
Albumin: 4.3 g/dL (ref 3.5–5.5)
Alkaline Phosphatase: 155 IU/L — ABNORMAL HIGH (ref 39–117)
BUN/Creatinine Ratio: 14 (ref 9–23)
BUN: 10 mg/dL (ref 6–24)
Bilirubin Total: 0.5 mg/dL (ref 0.0–1.2)
CO2: 21 mmol/L (ref 20–29)
Calcium: 9.7 mg/dL (ref 8.7–10.2)
Chloride: 97 mmol/L (ref 96–106)
Creatinine, Ser: 0.73 mg/dL (ref 0.57–1.00)
GFR calc Af Amer: 106 mL/min/{1.73_m2} (ref >59)
GFR calc non Af Amer: 92 mL/min/{1.73_m2} (ref >59)
Globulin, Total: 3.3 g/dL (ref 1.5–4.5)
Glucose: 469 mg/dL — ABNORMAL HIGH (ref 65–99)
Potassium: 4.5 mmol/L (ref 3.5–5.2)
Sodium: 136 mmol/L (ref 134–144)
Total Protein: 7.6 g/dL (ref 6.0–8.5)

## 2017-08-19 LAB — MICROALBUMIN / CREATININE URINE RATIO
Creatinine, Urine: 29.9 mg/dL
Microalb/Creat Ratio: <10 mg/g creat (ref 0.0–30.0)
Microalbumin, Urine: <3 ug/mL

## 2017-08-20 ENCOUNTER — Other Ambulatory Visit: Payer: Self-pay | Admitting: Family Medicine

## 2017-08-20 ENCOUNTER — Telehealth: Payer: Self-pay | Admitting: Family Medicine

## 2017-08-20 MED ORDER — PEN NEEDLES 31G X 8 MM MISC: 1.0000 [IU] | Freq: Every day | 2 refills | Status: DC

## 2017-08-20 NOTE — Progress Notes (Unsigned)
Did not order needles for Lantus pen during encounter.  Entering in needle prescription now.

## 2017-08-20 NOTE — Telephone Encounter (Signed)
Called and spoke to patient and told her that she should be able to pick up the needles as they have been called in to her pharmacy.Ozella Almond, CMA

## 2017-08-20 NOTE — Telephone Encounter (Signed)
Lockamy called in insulin pens for this pt the other day but didn't call in the needles. [facility] said they need a Rx called in for that. Please advise

## 2017-08-24 DIAGNOSIS — G2581 Restless legs syndrome: Secondary | ICD-10-CM | POA: Insufficient documentation

## 2017-08-24 DIAGNOSIS — E1142 Type 2 diabetes mellitus with diabetic polyneuropathy: Secondary | ICD-10-CM | POA: Insufficient documentation

## 2017-08-24 DIAGNOSIS — G894 Chronic pain syndrome: Secondary | ICD-10-CM | POA: Insufficient documentation

## 2017-08-24 NOTE — Assessment & Plan Note (Signed)
Unclear etiology. Could be somatization from depression and she did have an inciting physical event. She does not meet criteria for Fibromyalgia and no other symptoms at this time to suggest any rheumatological cause.  I have started her on Amitriptyline 25mg  to see if that will improve her symptoms.   Did discuss with patient exercise should improve her symptoms and recommended at least 30 mins a day 5 times per week.  Will follow up to see if she has improvement.

## 2017-08-24 NOTE — Assessment & Plan Note (Signed)
HgbA1c at check today was >15.0. Counseled patient extensively on the importance of controlling her blood sugar; discussed long term health risks to not controlling her blood sugar. Gave education on diet and exercise to help but discussed with patient the need to restart medications. Patient was in agreement.  Starting 10U of Lantus each night and restarting Metformin 500mg  BID. She will begin checking her blood sugar every morning and for each morning it is above 150 she will add 1U of Lantus to her daily regimen.  I will have her come back in for follow up in one week and have her seen by pharmacy for more DM education.  I suspect with her recent weight loss that she is exhausting or has begun to exhaust her insulin that she can produce.  Patient was educated on how to be aware of hypoglycemia and what steps to take if she is found to have low blood sugar.

## 2017-08-24 NOTE — Assessment & Plan Note (Signed)
I suspect her pain and tingling in her legs and feet are due to polyneuropathy from uncontrolled DM.  Started her on gabapentin 100mg  TID. Will continue to monitor for improvement in managing this symptom.   Of note, she may have several issues with pain overlapping and causing this mixed presentation.

## 2017-08-24 NOTE — Assessment & Plan Note (Signed)
Could also be a component of her lower extremity pain; specifically her pain and involuntary movement at night.  Gabapentin should help with this as well as her diabetic neuropathy.

## 2017-10-28 ENCOUNTER — Other Ambulatory Visit: Payer: Self-pay

## 2017-10-28 ENCOUNTER — Emergency Department (HOSPITAL_COMMUNITY)
Admission: EM | Admit: 2017-10-28 | Discharge: 2017-10-28 | Disposition: A | Discharge status: Home / Self Care | Payer: BLUE CROSS/BLUE SHIELD | Attending: Emergency Medicine | Admitting: Emergency Medicine

## 2017-10-28 ENCOUNTER — Encounter (HOSPITAL_COMMUNITY): Payer: Self-pay

## 2017-10-28 DIAGNOSIS — M25512 Pain in left shoulder: Secondary | ICD-10-CM | POA: Diagnosis not present

## 2017-10-28 DIAGNOSIS — Z794 Long term (current) use of insulin: Secondary | ICD-10-CM | POA: Insufficient documentation

## 2017-10-28 DIAGNOSIS — M62838 Other muscle spasm: Secondary | ICD-10-CM | POA: Insufficient documentation

## 2017-10-28 DIAGNOSIS — G894 Chronic pain syndrome: Secondary | ICD-10-CM | POA: Diagnosis not present

## 2017-10-28 DIAGNOSIS — E1142 Type 2 diabetes mellitus with diabetic polyneuropathy: Secondary | ICD-10-CM | POA: Diagnosis not present

## 2017-10-28 DIAGNOSIS — Z87891 Personal history of nicotine dependence: Secondary | ICD-10-CM | POA: Diagnosis not present

## 2017-10-28 MED ORDER — HYDROCODONE-ACETAMINOPHEN 5-325 MG PO TABS: 1.0000 | ORAL_TABLET | ORAL | 0 refills | Status: DC | PRN

## 2017-10-28 MED ORDER — LIDOCAINE 5 % EX PTCH: 1.0000 | MEDICATED_PATCH | CUTANEOUS | 0 refills | Status: DC

## 2017-10-28 MED ORDER — LIDOCAINE 5 % EX PTCH
1.0000 | MEDICATED_PATCH | CUTANEOUS | Status: DC
  Administered 2017-10-28: 1 via TRANSDERMAL
  Filled 2017-10-28 (×3): qty 1

## 2017-10-28 MED ORDER — CYCLOBENZAPRINE HCL 10 MG PO TABS: 10.0000 mg | ORAL_TABLET | Freq: Three times a day (TID) | ORAL | 0 refills | Status: DC | PRN

## 2017-10-28 NOTE — ED Notes (Signed)
ED Provider at bedside. 

## 2017-10-28 NOTE — ED Notes (Signed)
Given 6 pack of Norco as directed.

## 2017-10-28 NOTE — ED Triage Notes (Signed)
Pt states left shoulder started hurting last night before she went to bed, states she took some alleve without relief.   Pt denies injury

## 2017-10-28 NOTE — ED Provider Notes (Signed)
[facility] EMERGENCY DEPARTMENT Provider Note   CSN: [id number] Arrival date & time: 10/28/17  0602     History   Chief Complaint Chief Complaint  Patient presents with  . Shoulder Pain    HPI [name] [name] is a 57 y.o. female.  Patient presents to the ER for evaluation of left shoulder pain.  Patient reports that she started to notice pain behind her left shoulder last night, but overnight it worsened.  She has had difficulty sleeping because of the pain.  She took some Aleve but it did not completely relieve the pain.  She cannot move the arm because it significant he worsens the pain to move it.  She denies any direct injury.  No chest pain, shortness of breath.  No numbness, tingling or weakness in the upper extremity.  She does not have any neck pain.     Past Medical History:  Diagnosis Date  . Cervical disc disorder with radiculopathy of cervical region   . Chest pain   . Chronic low back pain    HNP  . Depression dx 1997  . Depression   . Diabetes mellitus, type 2 ([place]) 2011   No insulin  . GERD (gastroesophageal reflux disease)   . Glaucoma   . Headache(784.0)   . Herniated disc   . Hypertension    Lab 11/2011:  CXR, EKG, CBC, TSH, BMet, troponin-normal; lipid profile: 188, 131, 36, 126   . Non-compliance   . Pancreatic cyst    Endoscopic aspiration in 09/2009  . Pneumonia 08/02/2012  . Shingles   . Vertigo     Patient Active Problem List   Diagnosis Date Noted  . Chronic pain syndrome 08/24/2017  . Diabetic polyneuropathy associated with type 2 diabetes mellitus ([place]) 08/24/2017  . Restless leg syndrome 08/24/2017  . Vertigo 04/09/2016  . Acute sinusitis 06/27/2015  . Cervical disc disorder with radiculopathy of cervical region 02/20/2015  . Neck muscle spasm 10/23/2014  . Healthcare maintenance 10/23/2014  . Depression 11/25/2013  . Diabetes type 2, uncontrolled ([place])     Past Surgical History:  Procedure Laterality Date  . ABDOMINAL  HYSTERECTOMY    . CESAREAN SECTION       OB History   None      Home Medications    Prior to Admission medications   Medication Sig Start Date End Date Taking? Authorizing Provider  amitriptyline (ELAVIL) 25 MG tablet Take 1 tablet (25 mg total) by mouth at bedtime. 08/18/17  Yes Lockamy, Timothy, DO  gabapentin (NEURONTIN) 100 MG capsule Take 1 capsule (100 mg total) by mouth 3 (three) times daily. 08/18/17  Yes Lockamy, Christia [place], DO  glucose blood test strip Relion Testing Strips Use as instructed 08/18/17  Yes Lockamy, Timothy, DO  ibuprofen (ADVIL,MOTRIN) 600 MG tablet Take 1 tablet (600 mg total) by mouth every 8 (eight) hours as needed. 08/18/17  Yes Nuala Alpha, DO  Insulin Glargine (LANTUS SOLOSTAR) 100 UNIT/ML Solostar Pen Inject 10 Units into the skin daily at 10 pm. Increase dose by 1Unit each day your BG is over 150 in the morning. 08/18/17  Yes Lockamy, Timothy, DO  Insulin Pen Needle (PEN NEEDLES) 31G X 8 MM MISC 1 Units by Does not apply route daily. 08/20/17  Yes Nuala Alpha, DO  metFORMIN (GLUCOPHAGE) 500 MG tablet Take 2 tablets (1,000 mg total) by mouth 2 (two) times daily with a meal. 08/18/17  Yes Lockamy, Timothy, DO  naproxen sodium (ALEVE) 220 MG tablet Take 200 mg  by mouth daily as needed.   Yes [provider]  venlafaxine XR (EFFEXOR XR) 75 MG 24 hr capsule Take 1 capsule (75 mg total) daily with breakfast by mouth. 05/30/17  Yes Harris, Abigail, PA-C  acetaminophen (TYLENOL) 325 MG tablet Take 2 tablets (650 mg total) by mouth every 6 (six) hours as needed. 08/18/17   Nuala Alpha, DO  cyclobenzaprine (FLEXERIL) 10 MG tablet Take 1 tablet (10 mg total) by mouth 3 (three) times daily as needed for muscle spasms. 10/28/17   Orpah Greek, MD  HYDROcodone-acetaminophen (NORCO/VICODIN) 5-325 MG tablet Take 1-2 tablets by mouth every 4 (four) hours as needed. 10/28/17   Orpah Greek, MD  Hypromellose (ARTIFICIAL TEARS OP) Place 1 drop  daily as needed into both eyes (dry eyes).    [provider]  lidocaine (LIDODERM) 5 % Place 1 patch onto the skin daily. Remove & Discard patch within 12 hours or as directed by MD 10/28/17   Orpah Greek, MD  meloxicam (MOBIC) 15 MG tablet Take 1 tablet (15 mg total) daily by mouth. 05/30/17   Margarita Mail, PA-C  metoCLOPramide (REGLAN) 10 MG tablet Take 1 tablet (10 mg total) by mouth every 8 (eight) hours as needed for nausea. 07/19/17   Isla Pence, MD    Family History Family History  Problem Relation Age of Onset  . Depression Mother        type 1    Social History Social History   Tobacco Use  . Smoking status: Former Smoker    Last attempt to quit: 08/03/1978    Years since quitting: 39.2  . Smokeless tobacco: Never Used  Substance Use Topics  . Alcohol use: No    Comment: Former  . Drug use: No     Allergies   Tramadol   Review of Systems Review of Systems  Musculoskeletal: Positive for arthralgias.  All other systems reviewed and are negative.    Physical Exam Updated Vital Signs BP 106/79 (BP Location: Right Arm)   Pulse (!) 108   Temp 98 F (36.7 C) (Oral)   Resp 18   Ht 5\' 6"  (1.676 m)   Wt 63 kg (139 lb)   SpO2 100%   BMI 22.44 kg/m   Physical Exam  Constitutional: She is oriented to person, place, and time. She appears well-developed and well-nourished. No distress.  HENT:  Head: Normocephalic and atraumatic.  Right Ear: Hearing normal.  Left Ear: Hearing normal.  Nose: Nose normal.  Mouth/Throat: Oropharynx is clear and moist and mucous membranes are normal.  Eyes: Pupils are equal, round, and reactive to light. Conjunctivae and EOM are normal.  Neck: Normal range of motion. Neck supple.  Cardiovascular: Regular rhythm, S1 normal and S2 normal. Exam reveals no gallop and no friction rub.  No murmur heard. Pulmonary/Chest: Effort normal and breath sounds normal. No respiratory distress. She exhibits no  tenderness.  Abdominal: Soft. Normal appearance and bowel sounds are normal. There is no hepatosplenomegaly. There is no tenderness. There is no rebound, no guarding, no tenderness at McBurney's point and negative Murphy's sign. No hernia.  Musculoskeletal:       Left shoulder: She exhibits decreased range of motion (Because of painful inhibition), tenderness (Posterior left shoulder) and spasm (Posterior left shoulder). She exhibits no deformity.       Arms: Neurological: She is alert and oriented to person, place, and time. She has normal strength. No cranial nerve deficit or sensory deficit. Coordination normal. GCS  eye subscore is 4. GCS verbal subscore is 5. GCS motor subscore is 6.  Skin: Skin is warm, dry and intact. No rash noted. No cyanosis.  Psychiatric: She has a normal mood and affect. Her speech is normal and behavior is normal. Thought content normal.  Nursing note and vitals reviewed.    ED Treatments / Results  Labs (all labs ordered are listed, but only abnormal results are displayed) Labs Reviewed - No data to display  EKG None  Radiology No results found.  Procedures Procedures (including critical care time)  Medications Ordered in ED Medications  lidocaine (LIDODERM) 5 % 1 patch (has no administration in time range)     Initial Impression / Assessment and Plan / ED Course  I have reviewed the triage vital signs and the nursing notes.  Pertinent labs & imaging results that were available during my care of the patient were reviewed by me and considered in my medical decision making (see chart for details).     Patient presents with left shoulder pain.  There was no injury.  Patient reluctant to move her left arm because moving the shoulder area worsens the pain.  She does not have any deformity.  No tenderness over her clavicle or anterior shoulder.  Patient has muscle spasm and significant tenderness over the posterior shoulder area and supra scapular area.   As there was no injury, she does not require imaging.  Final Clinical Impressions(s) / ED Diagnoses   Final diagnoses:  Acute pain of left shoulder    ED Discharge Orders        Ordered    lidocaine (LIDODERM) 5 %  Every 24 hours     10/28/17 0630    HYDROcodone-acetaminophen (NORCO/VICODIN) 5-325 MG tablet  Every 4 hours PRN     10/28/17 0630    cyclobenzaprine (FLEXERIL) 10 MG tablet  3 times daily PRN     10/28/17 0631       Orpah Greek, MD 10/28/17 [phone]

## 2017-10-29 MED FILL — Hydrocodone-Acetaminophen Tab 5-325 MG: ORAL | Qty: 6 | Status: AC

## 2017-10-31 ENCOUNTER — Emergency Department (HOSPITAL_COMMUNITY): Payer: BLUE CROSS/BLUE SHIELD

## 2017-10-31 ENCOUNTER — Encounter (HOSPITAL_COMMUNITY): Payer: Self-pay | Admitting: Emergency Medicine

## 2017-10-31 ENCOUNTER — Emergency Department (HOSPITAL_COMMUNITY)
Admission: EM | Admit: 2017-10-31 | Discharge: 2017-10-31 | Disposition: A | Discharge status: Home / Self Care | Payer: BLUE CROSS/BLUE SHIELD | Attending: Emergency Medicine | Admitting: Emergency Medicine

## 2017-10-31 ENCOUNTER — Other Ambulatory Visit: Payer: Self-pay

## 2017-10-31 DIAGNOSIS — Z87891 Personal history of nicotine dependence: Secondary | ICD-10-CM | POA: Diagnosis not present

## 2017-10-31 DIAGNOSIS — Z794 Long term (current) use of insulin: Secondary | ICD-10-CM | POA: Insufficient documentation

## 2017-10-31 DIAGNOSIS — R202 Paresthesia of skin: Secondary | ICD-10-CM | POA: Diagnosis not present

## 2017-10-31 DIAGNOSIS — M545 Low back pain: Secondary | ICD-10-CM | POA: Diagnosis not present

## 2017-10-31 DIAGNOSIS — Z79899 Other long term (current) drug therapy: Secondary | ICD-10-CM | POA: Insufficient documentation

## 2017-10-31 DIAGNOSIS — I10 Essential (primary) hypertension: Secondary | ICD-10-CM | POA: Diagnosis not present

## 2017-10-31 DIAGNOSIS — E1142 Type 2 diabetes mellitus with diabetic polyneuropathy: Secondary | ICD-10-CM | POA: Insufficient documentation

## 2017-10-31 DIAGNOSIS — R200 Anesthesia of skin: Secondary | ICD-10-CM | POA: Diagnosis not present

## 2017-10-31 HISTORY — DX: Chronic pain syndrome: G89.4

## 2017-10-31 HISTORY — DX: Type 2 diabetes mellitus with diabetic polyneuropathy: E11.42

## 2017-10-31 LAB — URINALYSIS, ROUTINE W REFLEX MICROSCOPIC
Bacteria, UA: NONE SEEN
Bilirubin Urine: NEGATIVE
Glucose, UA: >=500 mg/dL — AB
Hgb urine dipstick: NEGATIVE
Ketones, ur: NEGATIVE mg/dL
Leukocytes, UA: NEGATIVE
Nitrite: NEGATIVE
Protein, ur: NEGATIVE mg/dL
Specific Gravity, Urine: 1.033 — ABNORMAL HIGH (ref 1.005–1.030)
pH: 7 (ref 5.0–8.0)

## 2017-10-31 LAB — CBC WITH DIFFERENTIAL/PLATELET
Basophils Absolute: 0 10*3/uL (ref 0.0–0.1)
Basophils Relative: 0 %
Eosinophils Absolute: 0.1 10*3/uL (ref 0.0–0.7)
Eosinophils Relative: 2 %
HCT: 41.3 % (ref 36.0–46.0)
Hemoglobin: 13.9 g/dL (ref 12.0–15.0)
Lymphocytes Relative: 35 %
Lymphs Abs: 2 10*3/uL (ref 0.7–4.0)
MCH: 30.8 pg (ref 26.0–34.0)
MCHC: 33.7 g/dL (ref 30.0–36.0)
MCV: 91.6 fL (ref 78.0–100.0)
Monocytes Absolute: 0.3 10*3/uL (ref 0.1–1.0)
Monocytes Relative: 6 %
Neutro Abs: 3.3 10*3/uL (ref 1.7–7.7)
Neutrophils Relative %: 57 %
Platelets: 231 10*3/uL (ref 150–400)
RBC: 4.51 MIL/uL (ref 3.87–5.11)
RDW: 12 % (ref 11.5–15.5)
WBC: 5.8 10*3/uL (ref 4.0–10.5)

## 2017-10-31 LAB — COMPREHENSIVE METABOLIC PANEL WITH GFR
ALT: 62 U/L — ABNORMAL HIGH (ref 14–54)
AST: 53 U/L — ABNORMAL HIGH (ref 15–41)
Albumin: 3.5 g/dL (ref 3.5–5.0)
Alkaline Phosphatase: 102 U/L (ref 38–126)
Anion gap: 9 (ref 5–15)
BUN: 13 mg/dL (ref 6–20)
CO2: 26 mmol/L (ref 22–32)
Calcium: 9.2 mg/dL (ref 8.9–10.3)
Chloride: 101 mmol/L (ref 101–111)
Creatinine, Ser: 0.53 mg/dL (ref 0.44–1.00)
GFR calc Af Amer: >60 mL/min
GFR calc non Af Amer: >60 mL/min
Glucose, Bld: 320 mg/dL — ABNORMAL HIGH (ref 65–99)
Potassium: 4.3 mmol/L (ref 3.5–5.1)
Sodium: 136 mmol/L (ref 135–145)
Total Bilirubin: 0.7 mg/dL (ref 0.3–1.2)
Total Protein: 7.1 g/dL (ref 6.5–8.1)

## 2017-10-31 NOTE — ED Triage Notes (Signed)
Pt reports she has been having bilateral leg numbness and tingling for several months.  States it is interfering with her ability to stand and work.  No focal neuro deficit.

## 2017-10-31 NOTE — ED Provider Notes (Signed)
[facility] EMERGENCY DEPARTMENT Provider Note   CSN: [id number] Arrival date & time: 10/31/17  1038     History   Chief Complaint Chief Complaint  Patient presents with  . Numbness    HPI [name] [name] is a 57 y.o. female.  Patient complains of numbness and tingling in both lower extremities.  This is been going on for many months.  She has been on Neurontin and her doctor thinks she has diabetic neuropathy.  The history is provided by the patient.  Illness  This is a recurrent problem. The current episode started more than 1 week ago. The problem occurs constantly. The problem has not changed since onset.Pertinent negatives include no chest pain, no abdominal pain and no headaches. Nothing aggravates the symptoms. Nothing relieves the symptoms. She has tried nothing for the symptoms. The treatment provided mild relief.    Past Medical History:  Diagnosis Date  . Cervical disc disorder with radiculopathy of cervical region   . Chest pain   . Chronic low back pain    HNP  . Chronic pain syndrome   . Depression dx 1997  . Depression   . Diabetes mellitus, type 2 ([place]) 2011   No insulin  . Diabetic polyneuropathy ([place])   . GERD (gastroesophageal reflux disease)   . Glaucoma   . Headache(784.0)   . Herniated disc   . Hypertension    Lab 11/2011:  CXR, EKG, CBC, TSH, BMet, troponin-normal; lipid profile: 188, 131, 36, 126   . Non-compliance   . Pancreatic cyst    Endoscopic aspiration in 09/2009  . Pneumonia 08/02/2012  . Shingles   . Vertigo     Patient Active Problem List   Diagnosis Date Noted  . Chronic pain syndrome 08/24/2017  . Diabetic polyneuropathy associated with type 2 diabetes mellitus ([place]) 08/24/2017  . Restless leg syndrome 08/24/2017  . Vertigo 04/09/2016  . Acute sinusitis 06/27/2015  . Cervical disc disorder with radiculopathy of cervical region 02/20/2015  . Neck muscle spasm 10/23/2014  . Healthcare maintenance 10/23/2014  . Depression  11/25/2013  . Diabetes type 2, uncontrolled ([place])     Past Surgical History:  Procedure Laterality Date  . ABDOMINAL HYSTERECTOMY    . CESAREAN SECTION       OB History   None      Home Medications    Prior to Admission medications   Medication Sig Start Date End Date Taking? Authorizing Provider  acetaminophen (TYLENOL) 325 MG tablet Take 2 tablets (650 mg total) by mouth every 6 (six) hours as needed. 08/18/17  Yes Lockamy, Christia Reading, DO  amitriptyline (ELAVIL) 25 MG tablet Take 1 tablet (25 mg total) by mouth at bedtime. 08/18/17  Yes Lockamy, Timothy, DO  cyclobenzaprine (FLEXERIL) 10 MG tablet Take 1 tablet (10 mg total) by mouth 3 (three) times daily as needed for muscle spasms. 10/28/17  Yes Pollina, Gwenyth Allegra, MD  gabapentin (NEURONTIN) 100 MG capsule Take 1 capsule (100 mg total) by mouth 3 (three) times daily. 08/18/17  Yes Lockamy, Christia Reading, DO  glucose blood test strip Relion Testing Strips Use as instructed 08/18/17  Yes Lockamy, Timothy, DO  Insulin Glargine (LANTUS SOLOSTAR) 100 UNIT/ML Solostar Pen Inject 10 Units into the skin daily at 10 pm. Increase dose by 1Unit each day your BG is over 150 in the morning. 08/18/17  Yes Lockamy, Timothy, DO  Insulin Pen Needle (PEN NEEDLES) 31G X 8 MM MISC 1 Units by Does not apply route daily. 08/20/17  Yes  Lockamy, Timothy, DO  lidocaine (LIDODERM) 5 % Place 1 patch onto the skin daily. Remove & Discard patch within 12 hours or as directed by MD 10/28/17  Yes Pollina, Gwenyth Allegra, MD  metFORMIN (GLUCOPHAGE) 500 MG tablet Take 2 tablets (1,000 mg total) by mouth 2 (two) times daily with a meal. 08/18/17  Yes Lockamy, Timothy, DO  naproxen sodium (ALEVE) 220 MG tablet Take 200 mg by mouth daily as needed.   Yes [provider]  venlafaxine XR (EFFEXOR XR) 75 MG 24 hr capsule Take 1 capsule (75 mg total) daily with breakfast by mouth. 05/30/17  Yes Harris, Abigail, PA-C  HYDROcodone-acetaminophen (NORCO/VICODIN) 5-325 MG tablet  Take 1-2 tablets by mouth every 4 (four) hours as needed. Patient not taking: Reported on 10/31/2017 10/28/17   Orpah Greek, MD  ibuprofen (ADVIL,MOTRIN) 600 MG tablet Take 1 tablet (600 mg total) by mouth every 8 (eight) hours as needed. Patient not taking: Reported on 10/31/2017 08/18/17   Nuala Alpha, DO  meloxicam (MOBIC) 15 MG tablet Take 1 tablet (15 mg total) daily by mouth. Patient not taking: Reported on 10/31/2017 05/30/17   Margarita Mail, PA-C  metoCLOPramide (REGLAN) 10 MG tablet Take 1 tablet (10 mg total) by mouth every 8 (eight) hours as needed for nausea. Patient not taking: Reported on 10/31/2017 07/19/17   Isla Pence, MD    Family History Family History  Problem Relation Age of Onset  . Depression Mother        type 1    Social History Social History   Tobacco Use  . Smoking status: Former Smoker    Last attempt to quit: 08/03/1978    Years since quitting: 39.2  . Smokeless tobacco: Never Used  Substance Use Topics  . Alcohol use: No    Comment: Former  . Drug use: No     Allergies   Tramadol   Review of Systems Review of Systems  Constitutional: Negative for appetite change and fatigue.  HENT: Negative for congestion, ear discharge and sinus pressure.   Eyes: Negative for discharge.  Respiratory: Negative for cough.   Cardiovascular: Negative for chest pain.  Gastrointestinal: Negative for abdominal pain and diarrhea.  Genitourinary: Negative for frequency and hematuria.  Musculoskeletal: Negative for back pain.  Skin: Negative for rash.  Neurological: Negative for seizures and headaches.       Numbness and tingling of both lower legs  Psychiatric/Behavioral: Negative for hallucinations.     Physical Exam Updated Vital Signs BP 112/78   Pulse 97   Temp 98.3 F (36.8 C) (Oral)   Resp 18   Ht 5\' 6"  (1.676 m)   Wt 63 kg (139 lb)   SpO2 100%   BMI 22.44 kg/m   Physical Exam  Constitutional: She is oriented to person,  place, and time. She appears well-developed.  HENT:  Head: Normocephalic.  Eyes: Conjunctivae and EOM are normal. No scleral icterus.  Neck: Neck supple. No thyromegaly present.  Cardiovascular: Normal rate and regular rhythm. Exam reveals no gallop and no friction rub.  No murmur heard. Pulmonary/Chest: No stridor. She has no wheezes. She has no rales. She exhibits no tenderness.  Abdominal: She exhibits no distension. There is no tenderness. There is no rebound.  Musculoskeletal: Normal range of motion. She exhibits no edema.  Lymphadenopathy:    She has no cervical adenopathy.  Neurological: She is oriented to person, place, and time. She exhibits normal muscle tone. Coordination normal.  Minor numbness in both feet.  Skin: No rash noted. No erythema.  Psychiatric: She has a normal mood and affect. Her behavior is normal.     ED Treatments / Results  Labs (all labs ordered are listed, but only abnormal results are displayed) Labs Reviewed  COMPREHENSIVE METABOLIC PANEL - Abnormal; Notable for the following components:      Result Value   Glucose, Bld 320 (*)    AST 53 (*)    ALT 62 (*)    All other components within normal limits  URINALYSIS, ROUTINE W REFLEX MICROSCOPIC - Abnormal; Notable for the following components:   Specific Gravity, Urine 1.033 (*)    Glucose, UA >=500 (*)    Squamous Epithelial / LPF 0-5 (*)    All other components within normal limits  CBC WITH DIFFERENTIAL/PLATELET    EKG EKG Interpretation  Date/Time:  Sunday October 31 2017 11:00:46 EDT Ventricular Rate:  104 PR Interval:    QRS Duration: 79 QT Interval:  345 QTC Calculation: 454 R Axis:   100 Text Interpretation:  Sinus tachycardia Right axis deviation Low voltage, precordial leads Confirmed by Milton Ferguson [id number]) on 10/31/2017 2:35:13 PM   Radiology Ct Lumbar Spine Wo Contrast  Result Date: 10/31/2017 CLINICAL DATA:  Abnormal x-ray. Low back pain for years. Bilateral leg numbness  and tingling for several months, worse over the past 2 weeks. No known injury EXAM: CT LUMBAR SPINE WITHOUT CONTRAST TECHNIQUE: Multidetector CT imaging of the lumbar spine was performed without intravenous contrast administration. Multiplanar CT image reconstructions were also generated. COMPARISON:  02/18/2017 abdominal CT.  Lumbar spine MRI 06/13/2013 FINDINGS: Segmentation: 5 lumbar type non-rib-bearing vertebrae. Alignment: Slight retrolisthesis at L2-3 and L3-4 Vertebrae: No acute fracture or focal pathologic process. Paraspinal and other soft tissues: Mild atherosclerotic calcification. No acute finding. Disc levels: L4-5 focal disc degeneration with narrowing and endplate irregularity/sclerosis. The disc is bulging and there is small endplate spurs. Mild posterior element hypertrophy. Mild to moderate spinal stenosis and bilateral subarticular recess narrowing. L5-S1 mild disc narrowing with endplate spurring.  No impingement. IMPRESSION: 1. No acute finding. 2. L4-5 focal disc degeneration with mild to moderate spinal and subarticular recess narrowing. Electronically Signed   By: Monte Fantasia M.D.   On: 10/31/2017 13:51    Procedures Procedures (including critical care time)  Medications Ordered in ED Medications - No data to display   Initial Impression / Assessment and Plan / ED Course  I have reviewed the triage vital signs and the nursing notes.  Pertinent labs & imaging results that were available during my care of the patient were reviewed by me and considered in my medical decision making (see chart for details).     Labs show elevated glucose.  Some degenerative disc disease in her lumbar spine.  Patient has been instructed to increase Neurontin to 200 mg 3 times a day and she has a appointment with her family doctor tomorrow.  She is been referred to neurology  Final Clinical Impressions(s) / ED Diagnoses   Final diagnoses:  Paresthesia    ED Discharge Orders    None        Milton Ferguson, MD 10/31/17 1448

## 2017-10-31 NOTE — Discharge Instructions (Addendum)
Increase your Neurontin to 200 mg 3 times daily.  Follow-up with Dr. Merlene Laughter

## 2017-11-01 ENCOUNTER — Ambulatory Visit (INDEPENDENT_AMBULATORY_CARE_PROVIDER_SITE_OTHER): Payer: BLUE CROSS/BLUE SHIELD | Admitting: Family Medicine

## 2017-11-01 ENCOUNTER — Encounter: Payer: Self-pay | Admitting: Family Medicine

## 2017-11-01 ENCOUNTER — Other Ambulatory Visit: Payer: Self-pay

## 2017-11-01 DIAGNOSIS — E1142 Type 2 diabetes mellitus with diabetic polyneuropathy: Secondary | ICD-10-CM | POA: Diagnosis not present

## 2017-11-01 MED ORDER — GABAPENTIN 100 MG PO CAPS: 200.0000 mg | ORAL_CAPSULE | Freq: Three times a day (TID) | ORAL | 3 refills | Status: DC

## 2017-11-01 NOTE — Progress Notes (Signed)
   Subjective:   Patient ID: [name] [name]    DOB: July 05, 1961, 57 y.o. female   MRN: [id number]  CC: legs numbness  HPI: [name] [name] is a 57 y.o. female who presents to clinic today for the following issue.  Numbness/tingling in legs Patient reports numbness and tingling in bilateral lower legs which is chronic for her but has recently gotten worse over the last few months.  She states she "wakes up with it and goes to bed with it."  Symptoms come on at any time and nothing makes it better or worse. Pain is dull, radiates into her legs. She endorses some leg weakness due to this, denies any falls.  No history of trauma or injury to her back.  Dr Garlan Fillers started her on Amitryptiline 25 mg daily and Gabapentin 100 mg TID likely thought to be neuropathy.  She is a very poorly controlled diabetic.  She was seen in the ED for this on 4/14 and referred to Neurology.  Denies fever ,chills, nausea, vomiting.  No CP, SOB or abdominal pain.   ROS: See HPI for pertinent ROS.  Social: pt is a former smoker, quit 1980 Medications reviewed. Objective:   BP 102/68   Pulse (!) 109   Temp 98.3 F (36.8 C) (Oral)   Ht 5\' 6"  (1.676 m)   Wt 141 lb 6.4 oz (64.1 kg)   SpO2 96%   BMI 22.82 kg/m  Vitals and nursing note reviewed.  General: anxious appearing 57 yo female, NAD  HEENT: NCAT, EOMI, PERRL  CV: RRR no MRG, 2+ pedal pulses Lungs: CTAB, normal effort  MSK: normal ROM  Skin: warm, dry, no rash, brisk cap refill  Extremities: warm and well perfused, normal tone Neuro: alert, ox3, no focal deficits, negative straight leg test, motor strength 4/5 bilaterally in LE, 5/5 b/l in upper extr, normal sensation  Assessment & Plan:   Diabetic polyneuropathy associated with type 2 diabetes mellitus (HCC) Chronic, with recent worsening.  Likely 2/2 poorly controlled T2DM.  Currently taking Gabapentin 100 mg TID.  No red flags on exam.  -Recommend increase Gabapentin to 200 mg TID;  if needed, may increase  to 300 mg TID  -Continue amitriptyline -discussed with pt she needs to gain better glycemic control as this is likely diabetic neuropathy  -Recently referred to Neurology but has not yet established -Return precautions discussed -Recommend f/u as outpatient with Neuro  Meds ordered this encounter  Medications  . gabapentin (NEURONTIN) 100 MG capsule    Sig: Take 2 capsules (200 mg total) by mouth 3 (three) times daily.    Dispense:  90 capsule    Refill:  3   Follow up:  If symptoms worsen or do not improve   Lovenia Kim, MD [place], PGY-2 11/10/2017 2:41 PM

## 2017-11-01 NOTE — Patient Instructions (Signed)
It was nice meeting you today!  You were seen in clinic for numbness and tingling of your legs which is most likely due to uncontrolled diabetes and diabetic neuropathy.  As we discussed, better control of your sugars can help with the symptoms in addition to the medications that you are taking -- amitriptyline and gabapentin.  I have sent in a refill of your gabapentin and am increasing your dose to 200 mg 3 times a day, as you have a lot of room to go up on this medication.  If you are still having symptoms, you can increase to 300 mg 3 times a day.  I have included some information on diabetic neuropathy below for you to refer to.  If you have any new or worsening symptoms, please follow-up with our clinic.  Be well, Lovenia Kim MD    Diabetic Neuropathy Diabetic neuropathy is a nerve disease or nerve damage that is caused by diabetes mellitus. About half of all people with diabetes mellitus have some form of nerve damage. Nerve damage is more common in those who have had diabetes mellitus for many years and who generally have not had good control of their blood sugar (glucose) level. Diabetic neuropathy is a common complication of diabetes mellitus. There are three common types of diabetic neuropathy and a fourth type that is less common and less understood:  Peripheral neuropathy-This is the most common type of diabetic neuropathy. It causes damage to the nerves of the feet and legs first and then eventually the hands and arms. The damage affects the ability to sense touch.  Autonomic neuropathy-This type causes damage to the autonomic nervous system, which controls the following functions: ? Heartbeat. ? Body temperature. ? Blood pressure. ? Urination. ? Digestion. ? Sweating. ? Sexual function.  Focal neuropathy-Focal neuropathy can be painful and unpredictable and occurs most often in older adults with diabetes mellitus. It involves a specific nerve or one area and often comes on  suddenly. It usually does not cause long-term problems.  Radiculoplexus neuropathy- Sometimes called lumbosacral radiculoplexus neuropathy, radiculoplexus neuropathy affects the nerves of the thighs, hips, buttocks, or legs. It is more common in people with type 2 diabetes mellitus and in older men. It is characterized by debilitating pain, weakness, and atrophy, usually in the thigh muscles.  What are the causes? The cause of peripheral, autonomic, and focal neuropathies is diabetes mellitus that is uncontrolled and high glucose levels. The cause of radiculoplexus neuropathy is unknown. However, it is thought to be caused by inflammation related to uncontrolled glucose levels. What are the signs or symptoms? Peripheral Neuropathy Peripheral neuropathy develops slowly over time. When the nerves of the feet and legs no longer work there may be:  Burning, stabbing, or aching pain in the legs or feet.  Inability to feel pressure or pain in your feet. This can lead to: ? Thick calluses over pressure areas. ? Pressure sores. ? Ulcers.  Foot deformities.  Reduced ability to feel temperature changes.  Muscle weakness.  Autonomic Neuropathy The symptoms of autonomic neuropathy vary depending on which nerves are affected. Symptoms may include:  Problems with digestion, such as: ? Feeling sick to your stomach (nausea). ? Vomiting. ? Bloating. ? Constipation. ? Diarrhea. ? Abdominal pain.  Difficulty with urination. This occurs if you lose your ability to sense when your bladder is full. Problems include: ? Urine leakage (incontinence). ? Inability to empty your bladder completely (retention).  Rapid or irregular heartbeat (palpitations).  Blood pressure drops  when you stand up (orthostatic hypotension). When you stand up you may feel: ? Dizzy. ? Weak. ? Faint.  In men, inability to attain and maintain an erection.  In women, vaginal dryness and problems with decreased sexual  desire and arousal.  Problems with body temperature regulation.  Increased or decreased sweating.  Focal Neuropathy  Abnormal eye movements or abnormal alignment of both eyes.  Weakness in the wrist.  Foot drop. This results in an inability to lift the foot properly and abnormal walking or foot movement.  Paralysis on one side of your face (Bell palsy).  Chest or abdominal pain. Radiculoplexus Neuropathy  Sudden, severe pain in your hip, thigh, or buttocks.  Weakness and wasting of thigh muscles.  Difficulty rising from a seated position.  Abdominal swelling.  Unexplained weight loss (usually more than 10 lb [4.5 kg]). How is this diagnosed? Peripheral Neuropathy Your senses may be tested. Sensory function testing can be done with:  A light touch using a monofilament.  A vibration with tuning fork.  A sharp sensation with a pin prick.  Other tests that can help diagnose neuropathy are:  Nerve conduction velocity. This test checks the transmission of an electrical current through a nerve.  Electromyography. This shows how muscles respond to electrical signals transmitted by nearby nerves.  Quantitative sensory testing. This is used to assess how your nerves respond to vibrations and changes in temperature.  Autonomic Neuropathy Diagnosis is often based on reported symptoms. Tell your health care provider if you experience:  Dizziness.  Constipation.  Diarrhea.  Inappropriate urination or inability to urinate.  Inability to get or maintain an erection.  Tests that may be done include:  Electrocardiography or Holter monitor. These are tests that can help show problems with the heart rate or heart rhythm.  An X-ray exam may be done.  Focal Neuropathy Diagnosis is made based on your symptoms and what your health care provider finds during your exam. Other tests may be done. They may include:  Nerve conduction velocities. This checks the transmission of  electrical current through a nerve.  Electromyography. This shows how muscles respond to electrical signals transmitted by nearby nerves.  Quantitative sensory testing. This test is used to assess how your nerves respond to vibration and changes in temperature.  Radiculoplexus Neuropathy  Often the first thing is to eliminate any other issue or problems that might be the cause, as there is no standard test for diagnosis.  X-ray exam of your spine and lumbar region.  Spinal tap to rule out cancer.  MRI to rule out other lesions. How is this treated? Once nerve damage occurs, it cannot be reversed. The goal of treatment is to keep the disease or nerve damage from getting worse and affecting more nerve fibers. Controlling your blood glucose level is the key. Most people with radiculoplexus neuropathy see at least a partial improvement over time. You will need to keep your blood glucose and HbA1c levels in the target range determined by your health care provider. Things that help control blood glucose levels include:  Blood glucose monitoring.  Meal planning.  Physical activity.  Diabetes medicine.  Over time, maintaining lower blood glucose levels helps lessen symptoms. Sometimes, prescription pain medicine is needed. Follow these instructions at home:  Do not smoke.  Keep your blood glucose level in the range that you and your health care provider have determined acceptable for you.  Keep your blood pressure level in the range that you and your  health care provider have determined acceptable for you.  Eat a well-balanced diet.  Be physically active every day. Include strength training and balance exercises.  Protect your feet. ? Check your feet every day for sores, cuts, blisters, or signs of infection. ? Wear padded socks and supportive shoes. Use orthotic inserts, if necessary. ? Regularly check the insides of your shoes for worn spots. Make sure there are no rocks or other  items inside your shoes before you put them on. Contact a health care provider if:  You have burning, stabbing, or aching pain in the legs or feet.  You are unable to feel pressure or pain in your feet.  You develop problems with digestion such as: ? Nausea. ? Vomiting. ? Bloating. ? Constipation. ? Diarrhea. ? Abdominal pain.  You have difficulty with urination, such as: ? Incontinence. ? Retention.  You have palpitations.  You develop orthostatic hypotension. When you stand up you may feel: ? Dizzy. ? Weak. ? Faint.  You cannot attain and maintain an erection (in men).  You have vaginal dryness and problems with decreased sexual desire and arousal (in women).  You have severe pain in your thighs, legs, or buttocks.  You have unexplained weight loss. This information is not intended to replace advice given to you by your health care provider. Make sure you discuss any questions you have with your health care provider. Document Released: 09/14/2001 Document Revised: 12/12/2015 Document Reviewed: 12/15/2012 Elsevier Interactive Patient Education  2017 [employer].

## 2017-11-10 ENCOUNTER — Other Ambulatory Visit: Payer: Self-pay | Admitting: Family Medicine

## 2017-11-10 NOTE — Assessment & Plan Note (Addendum)
Chronic, with recent worsening.  Likely 2/2 poorly controlled T2DM.  Currently taking Gabapentin 100 mg TID.  No red flags on exam.  -Recommend increase Gabapentin to 200 mg TID;  if needed, may increase to 300 mg TID  -Continue amitriptyline -discussed with pt she needs to gain better glycemic control as this is likely diabetic neuropathy  -Recently referred to Neurology but has not yet established -Return precautions discussed -Recommend f/u as outpatient with Neuro

## 2017-12-14 ENCOUNTER — Other Ambulatory Visit: Payer: Self-pay | Admitting: Family Medicine

## 2017-12-29 ENCOUNTER — Telehealth: Payer: Self-pay | Admitting: Family Medicine

## 2017-12-29 NOTE — Telephone Encounter (Signed)
Pt came in office and left a FMLA form requesting to be filled and signed by MD. Stated she needs this so that she can go back to work on full duty and no restrictions. Last DOS 11-01-2017. Best phone # to contact is [phone]. Form was placed in Red team folder.

## 2017-12-30 NOTE — Telephone Encounter (Signed)
Pt came in office signed a Release of Information form, she wants her forms to be fax to her job. Fax # [phone] and Attention it to Amber at [employer]. Pt is also asking if it can be done ASAP before she could lose her job.

## 2017-12-30 NOTE — Telephone Encounter (Signed)
Reviewed form and placed in PCP's box for completion.   .Bryn Saline R, CMA  

## 2018-01-04 NOTE — Telephone Encounter (Signed)
Patient calling and wants a call back about these forms being completed, very important for her job. I do not see them at front desk or in PCP mailbox. Please follow up with patient.  Call back is [phone]  Danley Danker, RN [facility])

## 2018-01-06 NOTE — Telephone Encounter (Signed)
Left voice message for patient to call office and schedule and appointment for forms to be filled out.  [name] [name], [place]

## 2018-01-06 NOTE — Telephone Encounter (Signed)
Pt informed and appt made for July 9th. Lawton Dollinger, Salome Spotted, CMA

## 2018-01-19 ENCOUNTER — Other Ambulatory Visit: Payer: Self-pay | Admitting: Family Medicine

## 2018-01-25 ENCOUNTER — Ambulatory Visit (INDEPENDENT_AMBULATORY_CARE_PROVIDER_SITE_OTHER): Payer: BLUE CROSS/BLUE SHIELD | Admitting: Family Medicine

## 2018-01-25 VITALS — BP 100/65 | HR 121 | Temp 98.6°F | Ht 66.0 in | Wt 136.8 lb

## 2018-01-25 DIAGNOSIS — E1165 Type 2 diabetes mellitus with hyperglycemia: Secondary | ICD-10-CM

## 2018-01-25 LAB — POCT GLYCOSYLATED HEMOGLOBIN (HGB A1C): HbA1c POC (<> result, manual entry): >15 % (ref 4.0–5.6)

## 2018-01-25 MED ORDER — GABAPENTIN 100 MG PO CAPS: 200.0000 mg | ORAL_CAPSULE | Freq: Three times a day (TID) | ORAL | 3 refills | Status: DC

## 2018-01-25 MED ORDER — METFORMIN HCL 500 MG PO TABS: 1000.0000 mg | ORAL_TABLET | Freq: Two times a day (BID) | ORAL | 6 refills | Status: DC

## 2018-01-25 MED ORDER — INSULIN GLARGINE 100 UNIT/ML SOLOSTAR PEN: 10.0000 [IU] | PEN_INJECTOR | Freq: Every day | SUBCUTANEOUS | 10 refills | Status: DC

## 2018-01-25 NOTE — Progress Notes (Signed)
Subjective: Chief Complaint  Patient presents with  . Diabetes  . Form Completion     HPI: [name] [name] is a 57 y.o. presenting to clinic today to discuss the following:  FMLA Paperwork Patient requested medical leave and was required to fill out FMLA paperwork to return to work. 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  DM Type 2 Very poorly controlled and patient ran out of medications and did not request refills. Patient instructed to please contact office when out of these medications. A1c over 15 on poct testing today. Informed patient of dangers of having uncontrolled blood sugar. No dizziness, syncope, or feeling like she may pass out, no tremors, confusion, or increase in urination.  Health Maintenance: none     ROS noted in HPI.   Past Medical, Surgical, Social, and Family History Reviewed & Updated per EMR.   Pertinent Historical Findings include:   Social History   Tobacco Use  Smoking Status Former Smoker  . Last attempt to quit: 08/03/1978  . Years since quitting: 39.5  Smokeless Tobacco Never Used    Objective: BP 100/65 (BP Location: Left Arm, Patient Position: Sitting, Cuff Size: Normal)   Pulse (!) 121   Temp 98.6 F (37 C) (Oral)   Ht 5\' 6"  (1.676 m)   Wt 136 lb 12.8 oz (62.1 kg)   SpO2 97%   BMI 22.08 kg/m  Vitals and nursing notes reviewed  Physical Exam Gen: Alert and Oriented x 3, NAD HEENT: Normocephalic, atraumatic, PERRLA, EOMI CV: RRR, no murmurs, normal S1, S2 split, +2 pulses dorsalis pedis bilaterally, no JVD, no carotid bruits Resp: CTAB, no wheezing, rales, or rhonchi, comfortable work of breathing MSK: FROM in all four extremities, normal gait, 4/5 strength BLE DM Foot Exam: bilateral sensation intact at all 9 points, no uclers, no open wounds, no blisters, no callouses Ext: no clubbing, cyanosis, or edema Skin: warm, dry, intact, no rashes Psych: appropriate behavior,  mood  Results for orders placed or performed in visit on 01/25/18 (from the past 72 hour(s))  HgB A1c     Status: Abnormal   Collection Time: 01/25/18  1:36 PM  Result Value Ref Range   Hemoglobin A1C  4.0 - 5.6 %   HbA1c POC (<> result, manual entry) >15.0 4.0 - 5.6 %   HbA1c, POC (prediabetic range)  5.7 - 6.4 %   HbA1c, POC (controlled diabetic range)  0.0 - 7.0 %    Assessment/Plan:  Diabetes type 2, uncontrolled ([place]) Patient did not return for follow up and has not been compliant with medications. Re-educated patient on importance of controlling her blood sugar. Refilled Lantus 10U and Metformin 500mg  BID. Patient will return in one month and needs close follow up.   PATIENT EDUCATION PROVIDED: See AVS    Diagnosis and plan along with any newly prescribed medication(s) were discussed in detail with this patient today. The patient verbalized understanding and agreed with the plan. Patient advised if symptoms worsen return to clinic or ER.   Health Maintainance:   Orders Placed This Encounter  Procedures  . HgB A1c    Meds ordered this encounter  Medications  . metFORMIN (GLUCOPHAGE) 500 MG tablet    Sig: Take 2 tablets (1,000 mg total) by mouth 2 (two) times daily with a meal.    Dispense:  60 tablet    Refill:  6  . Insulin Glargine (LANTUS SOLOSTAR) 100  UNIT/ML Solostar Pen    Sig: Inject 10 Units into the skin daily at 10 pm. Increase dose by 1Unit each day your BG is over 150 in the morning.    Dispense:  5 pen    Refill:  10  . gabapentin (NEURONTIN) 100 MG capsule    Sig: Take 2 capsules (200 mg total) by mouth 3 (three) times daily.    Dispense:  180 capsule    Refill:  [place], DO 01/25/2018, 1:39 PM PGY-2, [place]

## 2018-01-25 NOTE — Assessment & Plan Note (Signed)
Patient did not return for follow up and has not been compliant with medications. Re-educated patient on importance of controlling her blood sugar. Refilled Lantus 10U and Metformin 500mg  BID. Patient will return in one month and needs close follow up.

## 2018-02-23 DIAGNOSIS — F329 Major depressive disorder, single episode, unspecified: Secondary | ICD-10-CM | POA: Diagnosis not present

## 2018-02-23 DIAGNOSIS — E1165 Type 2 diabetes mellitus with hyperglycemia: Secondary | ICD-10-CM | POA: Diagnosis not present

## 2018-02-23 DIAGNOSIS — E1140 Type 2 diabetes mellitus with diabetic neuropathy, unspecified: Secondary | ICD-10-CM | POA: Diagnosis not present

## 2018-03-16 DIAGNOSIS — M79604 Pain in right leg: Secondary | ICD-10-CM | POA: Diagnosis not present

## 2018-03-16 DIAGNOSIS — Z79891 Long term (current) use of opiate analgesic: Secondary | ICD-10-CM | POA: Diagnosis not present

## 2018-03-16 DIAGNOSIS — M545 Low back pain: Secondary | ICD-10-CM | POA: Diagnosis not present

## 2018-03-16 DIAGNOSIS — G629 Polyneuropathy, unspecified: Secondary | ICD-10-CM | POA: Diagnosis not present

## 2018-03-16 DIAGNOSIS — G894 Chronic pain syndrome: Secondary | ICD-10-CM | POA: Diagnosis not present

## 2018-03-16 DIAGNOSIS — Z79899 Other long term (current) drug therapy: Secondary | ICD-10-CM | POA: Diagnosis not present

## 2018-03-23 DIAGNOSIS — Z6820 Body mass index (BMI) 20.0-20.9, adult: Secondary | ICD-10-CM | POA: Diagnosis not present

## 2018-03-23 DIAGNOSIS — E119 Type 2 diabetes mellitus without complications: Secondary | ICD-10-CM | POA: Diagnosis not present

## 2018-03-23 DIAGNOSIS — E118 Type 2 diabetes mellitus with unspecified complications: Secondary | ICD-10-CM | POA: Diagnosis not present

## 2018-03-30 DIAGNOSIS — E118 Type 2 diabetes mellitus with unspecified complications: Secondary | ICD-10-CM | POA: Diagnosis not present

## 2018-04-05 DIAGNOSIS — E119 Type 2 diabetes mellitus without complications: Secondary | ICD-10-CM | POA: Diagnosis not present

## 2018-04-05 DIAGNOSIS — G600 Hereditary motor and sensory neuropathy: Secondary | ICD-10-CM | POA: Diagnosis not present

## 2018-04-05 DIAGNOSIS — Z6820 Body mass index (BMI) 20.0-20.9, adult: Secondary | ICD-10-CM | POA: Diagnosis not present

## 2018-04-11 ENCOUNTER — Encounter (HOSPITAL_COMMUNITY): Payer: Self-pay | Admitting: Emergency Medicine

## 2018-04-11 ENCOUNTER — Emergency Department (HOSPITAL_COMMUNITY)
Admission: EM | Admit: 2018-04-11 | Discharge: 2018-04-11 | Disposition: A | Discharge status: Home / Self Care | Payer: BLUE CROSS/BLUE SHIELD | Attending: Emergency Medicine | Admitting: Emergency Medicine

## 2018-04-11 DIAGNOSIS — Z5321 Procedure and treatment not carried out due to patient leaving prior to being seen by health care provider: Secondary | ICD-10-CM | POA: Diagnosis not present

## 2018-04-11 DIAGNOSIS — M7918 Myalgia, other site: Secondary | ICD-10-CM | POA: Diagnosis not present

## 2018-04-11 NOTE — ED Notes (Signed)
Called pharmacy to verify pt's medications and they state pt is not able to have her medications filled until 10/4.  Explained to pt even if she is given a rx today the pharmacy will not be able to fill it.  Pt verbalized understanding and expressed frustration about what to do because she is in pain.  Pt crying stating she must have something for pain as she cannot take this.

## 2018-04-11 NOTE — ED Triage Notes (Addendum)
Pt states she is having horrible pain from her neuropathy in her feet and legs.  States she has been out of her Neurontin for 3 days due it being too soon for it to be filled.  Appt with pain management on Wednesday.

## 2018-04-13 DIAGNOSIS — M79604 Pain in right leg: Secondary | ICD-10-CM | POA: Diagnosis not present

## 2018-04-13 DIAGNOSIS — G629 Polyneuropathy, unspecified: Secondary | ICD-10-CM | POA: Diagnosis not present

## 2018-04-13 DIAGNOSIS — M5136 Other intervertebral disc degeneration, lumbar region: Secondary | ICD-10-CM | POA: Diagnosis not present

## 2018-04-13 DIAGNOSIS — M47817 Spondylosis without myelopathy or radiculopathy, lumbosacral region: Secondary | ICD-10-CM | POA: Diagnosis not present

## 2018-04-13 DIAGNOSIS — E119 Type 2 diabetes mellitus without complications: Secondary | ICD-10-CM | POA: Diagnosis not present

## 2018-04-13 DIAGNOSIS — G894 Chronic pain syndrome: Secondary | ICD-10-CM | POA: Diagnosis not present

## 2018-04-22 ENCOUNTER — Ambulatory Visit: Payer: Self-pay | Admitting: Registered"

## 2018-05-02 DIAGNOSIS — M47817 Spondylosis without myelopathy or radiculopathy, lumbosacral region: Secondary | ICD-10-CM | POA: Diagnosis not present

## 2018-05-11 ENCOUNTER — Other Ambulatory Visit: Payer: Self-pay | Admitting: Pain Medicine

## 2018-05-11 DIAGNOSIS — M25512 Pain in left shoulder: Secondary | ICD-10-CM

## 2018-05-13 ENCOUNTER — Other Ambulatory Visit: Payer: Self-pay | Admitting: Family Medicine

## 2018-05-28 ENCOUNTER — Ambulatory Visit
Admission: RE | Admit: 2018-05-28 | Discharge: 2018-05-28 | Disposition: A | Discharge status: Home / Self Care | Payer: BLUE CROSS/BLUE SHIELD | Source: Ambulatory Visit | Attending: Pain Medicine | Admitting: Pain Medicine

## 2018-05-28 DIAGNOSIS — M19012 Primary osteoarthritis, left shoulder: Secondary | ICD-10-CM | POA: Diagnosis not present

## 2018-05-28 DIAGNOSIS — M25512 Pain in left shoulder: Secondary | ICD-10-CM

## 2018-06-23 ENCOUNTER — Encounter

## 2018-09-05 ENCOUNTER — Other Ambulatory Visit: Payer: Self-pay | Admitting: Pain Medicine

## 2018-09-05 ENCOUNTER — Ambulatory Visit
Admission: RE | Admit: 2018-09-05 | Discharge: 2018-09-05 | Disposition: A | Discharge status: Home / Self Care | Payer: BLUE CROSS/BLUE SHIELD | Source: Ambulatory Visit | Attending: Pain Medicine | Admitting: Pain Medicine

## 2018-09-05 DIAGNOSIS — G894 Chronic pain syndrome: Secondary | ICD-10-CM | POA: Diagnosis not present

## 2018-09-05 DIAGNOSIS — M25562 Pain in left knee: Secondary | ICD-10-CM | POA: Diagnosis not present

## 2018-09-05 DIAGNOSIS — G629 Polyneuropathy, unspecified: Secondary | ICD-10-CM | POA: Diagnosis not present

## 2018-09-05 DIAGNOSIS — M75102 Unspecified rotator cuff tear or rupture of left shoulder, not specified as traumatic: Secondary | ICD-10-CM | POA: Diagnosis not present

## 2018-09-05 DIAGNOSIS — M1712 Unilateral primary osteoarthritis, left knee: Secondary | ICD-10-CM | POA: Diagnosis not present

## 2018-09-15 DIAGNOSIS — M25562 Pain in left knee: Secondary | ICD-10-CM | POA: Diagnosis not present

## 2018-10-04 ENCOUNTER — Emergency Department (HOSPITAL_COMMUNITY): Payer: BLUE CROSS/BLUE SHIELD

## 2018-10-04 ENCOUNTER — Emergency Department (HOSPITAL_COMMUNITY)
Admission: EM | Admit: 2018-10-04 | Discharge: 2018-10-04 | Disposition: A | Discharge status: Home / Self Care | Payer: BLUE CROSS/BLUE SHIELD | Attending: Emergency Medicine | Admitting: Emergency Medicine

## 2018-10-04 ENCOUNTER — Other Ambulatory Visit: Payer: Self-pay

## 2018-10-04 DIAGNOSIS — R000 Tachycardia, unspecified: Secondary | ICD-10-CM | POA: Diagnosis not present

## 2018-10-04 DIAGNOSIS — Z79899 Other long term (current) drug therapy: Secondary | ICD-10-CM | POA: Diagnosis not present

## 2018-10-04 DIAGNOSIS — E1165 Type 2 diabetes mellitus with hyperglycemia: Secondary | ICD-10-CM | POA: Diagnosis not present

## 2018-10-04 DIAGNOSIS — R410 Disorientation, unspecified: Secondary | ICD-10-CM | POA: Diagnosis not present

## 2018-10-04 DIAGNOSIS — E1143 Type 2 diabetes mellitus with diabetic autonomic (poly)neuropathy: Secondary | ICD-10-CM | POA: Insufficient documentation

## 2018-10-04 DIAGNOSIS — Z87891 Personal history of nicotine dependence: Secondary | ICD-10-CM | POA: Diagnosis not present

## 2018-10-04 DIAGNOSIS — R42 Dizziness and giddiness: Secondary | ICD-10-CM | POA: Diagnosis not present

## 2018-10-04 DIAGNOSIS — Z794 Long term (current) use of insulin: Secondary | ICD-10-CM | POA: Diagnosis not present

## 2018-10-04 DIAGNOSIS — R739 Hyperglycemia, unspecified: Secondary | ICD-10-CM

## 2018-10-04 DIAGNOSIS — R51 Headache: Secondary | ICD-10-CM | POA: Diagnosis not present

## 2018-10-04 DIAGNOSIS — I959 Hypotension, unspecified: Secondary | ICD-10-CM | POA: Diagnosis not present

## 2018-10-04 DIAGNOSIS — R6889 Other general symptoms and signs: Secondary | ICD-10-CM

## 2018-10-04 LAB — CBC WITH DIFFERENTIAL/PLATELET
Abs Immature Granulocytes: 0.03 10*3/uL (ref 0.00–0.07)
Basophils Absolute: 0 10*3/uL (ref 0.0–0.1)
Basophils Relative: 0 %
Eosinophils Absolute: 0.1 10*3/uL (ref 0.0–0.5)
Eosinophils Relative: 2 %
HCT: 37.6 % (ref 36.0–46.0)
Hemoglobin: 13 g/dL (ref 12.0–15.0)
Immature Granulocytes: 0 %
Lymphocytes Relative: 30 %
Lymphs Abs: 2 10*3/uL (ref 0.7–4.0)
MCH: 31.9 pg (ref 26.0–34.0)
MCHC: 34.6 g/dL (ref 30.0–36.0)
MCV: 92.2 fL (ref 80.0–100.0)
Monocytes Absolute: 0.4 10*3/uL (ref 0.1–1.0)
Monocytes Relative: 6 %
Neutro Abs: 4.3 10*3/uL (ref 1.7–7.7)
Neutrophils Relative %: 62 %
Platelets: 229 10*3/uL (ref 150–400)
RBC: 4.08 MIL/uL (ref 3.87–5.11)
RDW: 11.6 % (ref 11.5–15.5)
WBC: 6.9 10*3/uL (ref 4.0–10.5)
nRBC: 0 % (ref 0.0–0.2)

## 2018-10-04 LAB — COMPREHENSIVE METABOLIC PANEL
ALT: 61 U/L — ABNORMAL HIGH (ref 0–44)
AST: 68 U/L — ABNORMAL HIGH (ref 15–41)
Albumin: 3.3 g/dL — ABNORMAL LOW (ref 3.5–5.0)
Alkaline Phosphatase: 125 U/L (ref 38–126)
Anion gap: 9 (ref 5–15)
BUN: 13 mg/dL (ref 6–20)
CO2: 21 mmol/L — ABNORMAL LOW (ref 22–32)
Calcium: 8.6 mg/dL — ABNORMAL LOW (ref 8.9–10.3)
Chloride: 102 mmol/L (ref 98–111)
Creatinine, Ser: 0.59 mg/dL (ref 0.44–1.00)
GFR calc Af Amer: >60 mL/min (ref >60)
GFR calc non Af Amer: >60 mL/min (ref >60)
Glucose, Bld: 391 mg/dL — ABNORMAL HIGH (ref 70–99)
Potassium: 5.2 mmol/L — ABNORMAL HIGH (ref 3.5–5.1)
Sodium: 132 mmol/L — ABNORMAL LOW (ref 135–145)
Total Bilirubin: 0.9 mg/dL (ref 0.3–1.2)
Total Protein: 6.4 g/dL — ABNORMAL LOW (ref 6.5–8.1)

## 2018-10-04 LAB — URINALYSIS, ROUTINE W REFLEX MICROSCOPIC
Bacteria, UA: NONE SEEN
Bilirubin Urine: NEGATIVE
Glucose, UA: >=500 mg/dL — AB
Hgb urine dipstick: NEGATIVE
Ketones, ur: 20 mg/dL — AB
Leukocytes,Ua: NEGATIVE
Nitrite: NEGATIVE
Protein, ur: NEGATIVE mg/dL
Specific Gravity, Urine: 1.033 — ABNORMAL HIGH (ref 1.005–1.030)
pH: 6 (ref 5.0–8.0)

## 2018-10-04 LAB — CBG MONITORING, ED
Glucose-Capillary: 393 mg/dL — ABNORMAL HIGH (ref 70–99)
Glucose-Capillary: 282 mg/dL — ABNORMAL HIGH (ref 70–99)

## 2018-10-04 LAB — POTASSIUM: Potassium: 3.9 mmol/L (ref 3.5–5.1)

## 2018-10-04 LAB — ETHANOL: Alcohol, Ethyl (B): <10 mg/dL (ref <10)

## 2018-10-04 MED ORDER — SODIUM CHLORIDE 0.9 % IV BOLUS
1000.0000 mL | Freq: Once | INTRAVENOUS | Status: AC
  Administered 2018-10-04: 1000 mL via INTRAVENOUS

## 2018-10-04 NOTE — ED Notes (Signed)
Patient verbalizes understanding of discharge instructions. Opportunity for questioning and answering were provided. Armband removed by staff , patient discharged from ED. 

## 2018-10-04 NOTE — ED Notes (Signed)
Pt ambulated about 50 ft and stated she was feeling dizziness and stopped for a minute before walking back to room.

## 2018-10-04 NOTE — ED Provider Notes (Signed)
[facility] EMERGENCY DEPARTMENT Provider Note   CSN: [id number] Arrival date & time: 10/04/18  1150    History   Chief Complaint Chief Complaint  Patient presents with  . Hyperglycemia  . Dizziness    HPI [name] [name] is a 58 y.o. female.     The history is provided by the patient and medical records. No language interpreter was used.  Hyperglycemia  Associated symptoms: dizziness and weakness   Associated symptoms: no fever   Dizziness  Associated symptoms: weakness   Associated symptoms: no headaches    [name] [name] is a 58 y.o. female  with a PMH of DM2 who presents to the Emergency Department complaining of forgetfulness and fatigue for the last 3 months.  She states that she has diabetic and has not taken her insulin in the last 3 months because she keeps forgetting to do so.  Things like this continued to happen.  Reports this is unlike her to forget things like taking medication.  She also reports multiple falls off and on over the last several months.  She denies having any loss of consciousness after 1 of these falls.  She is not on any blood thinners.  She reports that she will intermittently become dizzy while walking and have to hold onto something to catch her balance.  This happens a few times a week.  She states that her primary care doctor told her that her symptoms could be mini strokes or could be due to her blood sugars and that she should come to the hospital to get a CAT scan. No medications taken prior to arrival for symptoms. Denies numbness. Has generalized weakness to lower extremities. No back pain. No fevers. No neck pain. No cough/congesiton or recent illness. No headache or visual changes.  Past Medical History:  Diagnosis Date  . Cervical disc disorder with radiculopathy of cervical region   . Chest pain   . Chronic low back pain    HNP  . Chronic pain syndrome   . Depression dx 1997  . Depression   . Diabetes mellitus, type 2  ([place]) 2011   No insulin  . Diabetic polyneuropathy ([place])   . GERD (gastroesophageal reflux disease)   . Glaucoma   . Headache(784.0)   . Herniated disc   . Hypertension    Lab 11/2011:  CXR, EKG, CBC, TSH, BMet, troponin-normal; lipid profile: 188, 131, 36, 126   . Non-compliance   . Pancreatic cyst    Endoscopic aspiration in 09/2009  . Pneumonia 08/02/2012  . Shingles   . Vertigo     Patient Active Problem List   Diagnosis Date Noted  . Chronic pain syndrome 08/24/2017  . Diabetic polyneuropathy associated with type 2 diabetes mellitus ([place]) 08/24/2017  . Restless leg syndrome 08/24/2017  . Vertigo 04/09/2016  . Acute sinusitis 06/27/2015  . Cervical disc disorder with radiculopathy of cervical region 02/20/2015  . Neck muscle spasm 10/23/2014  . Healthcare maintenance 10/23/2014  . Depression 11/25/2013  . Diabetes type 2, uncontrolled ([place])     Past Surgical History:  Procedure Laterality Date  . ABDOMINAL HYSTERECTOMY    . CESAREAN SECTION       OB History   No obstetric history on file.      Home Medications    Prior to Admission medications   Medication Sig Start Date End Date Taking? Authorizing Provider  acetaminophen (TYLENOL) 325 MG tablet Take 2 tablets (650 mg total) by mouth every 6 (six)  hours as needed. Patient taking differently: Take 650 mg by mouth every 6 (six) hours as needed for mild pain.  08/18/17  Yes Lockamy, Timothy, DO  amitriptyline (ELAVIL) 25 MG tablet TAKE 1 TABLET(25 MG) BY MOUTH AT BEDTIME Patient taking differently: Take 25 mg by mouth at bedtime.  01/24/18  Yes Lockamy, Timothy, DO  gabapentin (NEURONTIN) 600 MG tablet Take 600 mg by mouth 3 (three) times daily. 08/29/18  Yes [provider]  HUMALOG KWIKPEN 100 UNIT/ML KwikPen Inject 10 Units into the skin daily. 05/04/18  Yes [provider]  ibuprofen (ADVIL,MOTRIN) 200 MG tablet Take 1,000 mg by mouth daily as needed for mild pain.   Yes [provider]   Insulin Glargine (LANTUS SOLOSTAR) 100 UNIT/ML Solostar Pen Inject 10 Units into the skin daily at 10 pm. Increase dose by 1Unit each day your BG is over 150 in the morning. 01/25/18  Yes Nuala Alpha, DO  venlafaxine XR (EFFEXOR XR) 75 MG 24 hr capsule Take 1 capsule (75 mg total) daily with breakfast by mouth. 05/30/17  Yes Harris, Abigail, PA-C  glucose blood test strip [facility] Strips Use as instructed 08/18/17   Nuala Alpha, DO  Insulin Pen Needle (PEN NEEDLES) 31G X 8 MM MISC 1 Units by Does not apply route daily. 08/20/17   Nuala Alpha, DO    Family History Family History  Problem Relation Age of Onset  . Depression Mother        type 1    Social History Social History   Tobacco Use  . Smoking status: Former Smoker    Last attempt to quit: 08/03/1978    Years since quitting: 40.1  . Smokeless tobacco: Never Used  Substance Use Topics  . Alcohol use: No    Comment: Former  . Drug use: No     Allergies   Tramadol   Review of Systems Review of Systems  Constitutional: Negative for fever.  Neurological: Positive for dizziness and weakness. Negative for numbness and headaches.       + Forgetfulness, confusion  All other systems reviewed and are negative.    Physical Exam Updated Vital Signs BP 135/81 (BP Location: Right Arm)   Pulse (!) 108   Temp 98.3 F (36.8 C) (Oral)   Resp 18   Ht 5\' 6"  (1.676 m)   Wt 58.1 kg   SpO2 100%   BMI 20.66 kg/m   Physical Exam Vitals signs and nursing note reviewed.  Constitutional:      General: She is not in acute distress.    Appearance: She is well-developed.  HENT:     Head: Normocephalic and atraumatic.  Neck:     Musculoskeletal: Neck supple.  Cardiovascular:     Rate and Rhythm: Normal rate and regular rhythm.     Heart sounds: Normal heart sounds. No murmur.  Pulmonary:     Effort: Pulmonary effort is normal. No respiratory distress.     Breath sounds: Normal breath sounds.  Abdominal:      General: There is no distension.     Palpations: Abdomen is soft.     Tenderness: There is no abdominal tenderness.  Musculoskeletal:     Comments: No C/T/L spine tenderness.  Skin:    General: Skin is warm and dry.  Neurological:     Mental Status: She is alert and oriented to person, place, and time.     Comments: Alert, oriented, thought content appropriate, able to give a coherent history. Speech  is clear and goal oriented, able to follow commands.  Cranial Nerves:  II:  Peripheral visual fields grossly normal, pupils equal, round, reactive to light III, IV, VI: EOM intact bilaterally, ptosis not present V,VII: smile symmetric, eyes kept closed tightly against resistance, facial light touch sensation equal VIII: hearing grossly normal IX, X: symmetric soft palate movement, uvula elevates symmetrically  XI: bilateral shoulder shrug symmetric and strong XII: midline tongue extension 5/5 muscle strength in upper extremities bilaterally including strong and equal grip strength. 4/5 strength to lower extremities.  Sensory to light touch normal in all four extremities.  Normal finger-to-nose and rapid alternating movements; No drift.      ED Treatments / Results  Labs (all labs ordered are listed, but only abnormal results are displayed) Labs Reviewed  COMPREHENSIVE METABOLIC PANEL - Abnormal; Notable for the following components:      Result Value   Sodium 132 (*)    Potassium 5.2 (*)    CO2 21 (*)    Glucose, Bld 391 (*)    Calcium 8.6 (*)    Total Protein 6.4 (*)    Albumin 3.3 (*)    AST 68 (*)    ALT 61 (*)    All other components within normal limits  URINALYSIS, ROUTINE W REFLEX MICROSCOPIC - Abnormal; Notable for the following components:   Color, Urine STRAW (*)    Specific Gravity, Urine 1.033 (*)    Glucose, UA >=500 (*)    Ketones, ur 20 (*)    All other components within normal limits  CBG MONITORING, ED - Abnormal; Notable for the following components:    Glucose-Capillary 393 (*)    All other components within normal limits  CBG MONITORING, ED - Abnormal; Notable for the following components:   Glucose-Capillary 282 (*)    All other components within normal limits  CBC WITH DIFFERENTIAL/PLATELET  ETHANOL  POTASSIUM    EKG EKG Interpretation  Date/Time:  Tuesday October 04 2018 12:08:33 EDT Ventricular Rate:  111 PR Interval:    QRS Duration: 84 QT Interval:  351 QTC Calculation: 477 R Axis:   87 Text Interpretation:  Sinus tachycardia since last tracing no significant change Confirmed by Daleen Bo [phone]) on 10/04/2018 12:12:15 PM   Radiology Ct Head Wo Contrast  Result Date: 10/04/2018 CLINICAL DATA:  Intermittent falls, dizziness and forgetfulness over the past 3 months. EXAM: CT HEAD WITHOUT CONTRAST TECHNIQUE: Contiguous axial images were obtained from the base of the skull through the vertex without intravenous contrast. COMPARISON:  Head CT scan 12/25/2007.  Brain MRI 04/09/2016. FINDINGS: Brain: No evidence of acute infarction, hemorrhage, hydrocephalus, extra-axial collection or mass lesion/mass effect. There is mild to moderate cortical atrophy which appears somewhat advanced for age. Vascular: No hyperdense vessel or unexpected calcification. Skull: Intact.  No focal lesion. Sinuses/Orbits: There is mucosal thickening in the maxillary sinuses bilaterally, worse on the left. Other: None. IMPRESSION: No acute abnormality. Mild-to-moderate cortical atrophy appears advanced for age. Bilateral maxillary sinus disease, worse on the left. Electronically Signed   By: Inge Rise M.D.   On: 10/04/2018 12:42    Procedures Procedures (including critical care time)  Medications Ordered in ED Medications  sodium chloride 0.9 % bolus 1,000 mL (0 mLs Intravenous Stopped 10/04/18 1319)     Initial Impression / Assessment and Plan / ED Course  I have reviewed the triage vital signs and the nursing notes.  Pertinent labs &  imaging results that were available during my care of  the patient were reviewed by me and considered in my medical decision making (see chart for details).       Birttany Dechellis is a 58 y.o. female who presents to ED for forgetfulness and confusion for the last 3 months. She also endorses forgetting to take her insulin over this timeframe as well. She reports dizziness with ambulation at times. CN 2-12 grossly intact. She is ambulatory in the emergency department without needing assistance. Labs reviewed and notable for hyperglycemia without evidence of DKA.  Initial potassium was elevated, but likely hemolyzed as repeat potassium was normal.  CT head with no acute abnormality.  She does have cortical atrophy which appears advanced for her age. Evaluation does not show pathology that would require ongoing emergent intervention or inpatient treatment.  Discussed that she does need to follow-up with her primary care doctor for further discussion of her symptoms.  Reasons to return to the emergency department were discussed as well.  All questions answered.  Patient discussed with Dr. Eulis Foster who agrees with treatment plan.    Final Clinical Impressions(s) / ED Diagnoses   Final diagnoses:  Hyperglycemia  Forgetfulness    ED Discharge Orders    None       , Ozella Almond, PA-C 10/04/18 1613    Daleen Bo, MD 10/04/18 [phone]

## 2018-10-04 NOTE — ED Triage Notes (Signed)
Pt brought in by ems for c.o dizziness/forgetfull/ multiple falls on and off x 3 months ; pt states she is a diabetic but hasnt took her insulin x 3 months ; upon ems arrival patients cbg was 495 an they have a 500 ml  Bolus ; cbg at 393 at this time ; pt alert and oriented x 4 , following simple commands , grips equal in strength

## 2018-10-04 NOTE — Discharge Instructions (Signed)
It was my pleasure taking care of you today!   It is very important that you take your insulin as directed by your doctor.   Increase fluid intake - stay hydrated.   Call your primary care doctor to schedule a follow up appointment.   Return to ER for new or worsening symptoms, any additional concerns.

## 2018-10-10 DIAGNOSIS — E1165 Type 2 diabetes mellitus with hyperglycemia: Secondary | ICD-10-CM | POA: Diagnosis not present

## 2018-10-10 DIAGNOSIS — R42 Dizziness and giddiness: Secondary | ICD-10-CM | POA: Diagnosis not present

## 2018-10-18 DIAGNOSIS — E119 Type 2 diabetes mellitus without complications: Secondary | ICD-10-CM | POA: Diagnosis not present

## 2018-10-18 DIAGNOSIS — Z6820 Body mass index (BMI) 20.0-20.9, adult: Secondary | ICD-10-CM | POA: Diagnosis not present

## 2018-10-18 DIAGNOSIS — R42 Dizziness and giddiness: Secondary | ICD-10-CM | POA: Diagnosis not present

## 2018-11-17 DIAGNOSIS — G629 Polyneuropathy, unspecified: Secondary | ICD-10-CM | POA: Diagnosis not present

## 2018-11-17 DIAGNOSIS — M79604 Pain in right leg: Secondary | ICD-10-CM | POA: Diagnosis not present

## 2018-11-17 DIAGNOSIS — M47817 Spondylosis without myelopathy or radiculopathy, lumbosacral region: Secondary | ICD-10-CM | POA: Diagnosis not present

## 2018-11-17 DIAGNOSIS — M75102 Unspecified rotator cuff tear or rupture of left shoulder, not specified as traumatic: Secondary | ICD-10-CM | POA: Diagnosis not present

## 2019-01-05 ENCOUNTER — Other Ambulatory Visit (HOSPITAL_COMMUNITY): Payer: Self-pay

## 2019-01-06 ENCOUNTER — Ambulatory Visit (HOSPITAL_COMMUNITY)
Admission: RE | Admit: 2019-01-06 | Discharge: 2019-01-06 | Disposition: A | Discharge status: Home / Self Care | Payer: BC Managed Care – PPO | Source: Ambulatory Visit | Attending: Internal Medicine | Admitting: Internal Medicine

## 2019-01-06 ENCOUNTER — Other Ambulatory Visit: Payer: Self-pay

## 2019-01-06 ENCOUNTER — Encounter (HOSPITAL_COMMUNITY): Payer: BLUE CROSS/BLUE SHIELD

## 2019-01-06 DIAGNOSIS — R42 Dizziness and giddiness: Secondary | ICD-10-CM | POA: Diagnosis not present

## 2019-01-06 LAB — ACTH STIMULATION, 3 TIME POINTS
Cortisol, 30 Min: 32 ug/dL
Cortisol, 60 Min: 34.1 ug/dL
Cortisol, Base: 23.6 ug/dL

## 2019-01-06 LAB — CORTISOL-AM, BLOOD: Cortisol - AM: 24.3 ug/dL — ABNORMAL HIGH (ref 6.7–22.6)

## 2019-01-06 MED ORDER — COSYNTROPIN 0.25 MG IJ SOLR
INTRAMUSCULAR | Status: AC
  Administered 2019-01-06: 0.25 mg
  Filled 2019-01-06: qty 0.25

## 2019-01-06 MED ORDER — COSYNTROPIN 0.25 MG IJ SOLR: 0.2500 mg | Freq: Once | INTRAMUSCULAR | Status: DC

## 2019-01-06 NOTE — Discharge Instructions (Signed)
ACTH Stimulation Test °Why am I having this test? °The adrenocorticotropic hormone (ACTH) stimulation test is used to measure how well your adrenal glands are working. °What is being tested? °This test checks the levels of cortisol in your blood before and after your adrenal glands are stimulated with ACTH. ACTH is produced by a gland in your brain called the pituitary gland. ACTH stimulates your two adrenal glands, which are located above each kidney. The adrenal glands produce hormones that are released into the blood. One of these hormones is cortisol. Cortisol helps your body to respond to stress. If your adrenal glands are not working well and are not responding to ACTH properly, the test result will show too little cortisol. °What kind of sample is taken? ° °Two or more blood samples are required for this test. The samples are usually collected by inserting a needle into a blood vessel. °How do I prepare for this test? °· Do not eat or drink anything after midnight on the night before the test or as directed by your health care provider. You may continue to drink water up until the time of your test. °· You may be instructed to avoid certain medicines that can affect cortisol levels, such as those containing estrogen or steroids. Make sure that the health care provider ordering the test is aware of any recent use of steroid hormones, such as prednisone or cortisone injections. °· Follow any additional instructions as directed by your health care provider. °What happens during the test? °This test is usually done in the morning, as your cortisol levels change throughout the day. °1. The first blood sample will be collected. Cortisol will be measured in this sample to provide your starting (baseline) level. °2. You will be given cosyntropin by injection or through an IV. Cosyntropin is similar to ACTH and should cause the adrenal glands to release cortisol into the bloodstream. °? You may feel a slight flush  after the cosyntropin is given. This is normal. °3. One or more blood samples will be taken at specified intervals (30 or 60 minutes after the cosyntropin injection) in order to measure your cortisol levels after the adrenal gland is stimulated. °4. The test results will be compared to show the amount of cortisol in your blood before and after you were given cosyntropin. °How are the results reported? °Your test results will be reported as values. Your health care provider will compare your results to normal ranges that were established after testing a large group of people (reference ranges). Reference ranges may vary among labs and hospitals. For this test, a common reference range is: °· A baseline cortisol level from 7 mcg/dL to 10 mcg/dL, reaching at least 18 mcg/dL at 60 minutes after stimulation. °What do the results mean? °Results outside the reference range may indicate that you have: °· Adrenal insufficiency. This can be caused by a problem in the adrenal gland itself (primary adrenal insufficiency) or by a problem outside the adrenal gland (secondary adrenal insufficiency). °? Causes of primary adrenal insufficiency include autoimmune inflammation of the adrenal gland, infection involving the adrenal gland, a tumor that has spread to the adrenal gland, and bleeding into the adrenal gland. °? Causes of secondary adrenal insufficiency include conditions that cause the pituitary gland to function less than normal (hypopituitarism). This may be due to a specific disease involving the pituitary gland or the use of high-dose steroid medications to treat another medical condition. °Other tests may be needed to find the cause of   adrenal gland conditions and confirm a diagnosis. °Talk with your health care provider about what your results mean. °Questions to ask your health care provider °Ask your health care provider, or the department that is doing the test: °· When will my results be ready? °· How will I get my  results? °· What are my treatment options? °· What other tests do I need? °· What are my next steps? °Summary °· The ACTH stimulation test is used to measure how well your adrenal glands are working. °· The test has three steps. First, your blood is tested to measure your baseline cortisol level. Second, you are given cosyntropin to stimulate your adrenal glands to release cortisol. Third, your blood is tested to see how much cortisol is in your blood after stimulation. °· Results outside the normal range may indicate that you have a condition that affects how well your adrenal glands function. °This information is not intended to replace advice given to you by your health care provider. Make sure you discuss any questions you have with your health care provider. °Document Released: 08/08/2010 Document Revised: 03/09/2017 Document Reviewed: 03/09/2017 °Elsevier Interactive Patient Education © 2019 [employer] ° °

## 2019-01-09 ENCOUNTER — Other Ambulatory Visit (HOSPITAL_COMMUNITY): Payer: Self-pay | Admitting: Pain Medicine

## 2019-01-09 ENCOUNTER — Ambulatory Visit (HOSPITAL_COMMUNITY)
Admission: RE | Admit: 2019-01-09 | Discharge: 2019-01-09 | Disposition: A | Discharge status: Home / Self Care | Payer: BC Managed Care – PPO | Source: Ambulatory Visit | Attending: Pain Medicine | Admitting: Pain Medicine

## 2019-01-09 ENCOUNTER — Other Ambulatory Visit: Payer: Self-pay

## 2019-01-09 DIAGNOSIS — G629 Polyneuropathy, unspecified: Secondary | ICD-10-CM | POA: Diagnosis not present

## 2019-01-09 DIAGNOSIS — G894 Chronic pain syndrome: Secondary | ICD-10-CM | POA: Diagnosis not present

## 2019-01-09 DIAGNOSIS — M25572 Pain in left ankle and joints of left foot: Secondary | ICD-10-CM | POA: Diagnosis not present

## 2019-01-09 DIAGNOSIS — M79672 Pain in left foot: Secondary | ICD-10-CM | POA: Diagnosis not present

## 2019-01-09 DIAGNOSIS — M79671 Pain in right foot: Secondary | ICD-10-CM | POA: Insufficient documentation

## 2019-01-09 DIAGNOSIS — M25561 Pain in right knee: Secondary | ICD-10-CM

## 2019-01-09 DIAGNOSIS — M25571 Pain in right ankle and joints of right foot: Secondary | ICD-10-CM | POA: Insufficient documentation

## 2019-01-09 DIAGNOSIS — M1712 Unilateral primary osteoarthritis, left knee: Secondary | ICD-10-CM | POA: Diagnosis not present

## 2019-01-09 DIAGNOSIS — Z79899 Other long term (current) drug therapy: Secondary | ICD-10-CM | POA: Diagnosis not present

## 2019-01-09 DIAGNOSIS — M19071 Primary osteoarthritis, right ankle and foot: Secondary | ICD-10-CM | POA: Diagnosis not present

## 2019-01-09 DIAGNOSIS — M79604 Pain in right leg: Secondary | ICD-10-CM | POA: Diagnosis not present

## 2019-01-09 DIAGNOSIS — Z79891 Long term (current) use of opiate analgesic: Secondary | ICD-10-CM | POA: Diagnosis not present

## 2019-01-11 DIAGNOSIS — R42 Dizziness and giddiness: Secondary | ICD-10-CM | POA: Diagnosis not present

## 2019-01-11 DIAGNOSIS — E1065 Type 1 diabetes mellitus with hyperglycemia: Secondary | ICD-10-CM | POA: Diagnosis not present

## 2019-01-11 DIAGNOSIS — E119 Type 2 diabetes mellitus without complications: Secondary | ICD-10-CM | POA: Diagnosis not present

## 2019-01-11 DIAGNOSIS — Z794 Long term (current) use of insulin: Secondary | ICD-10-CM | POA: Diagnosis not present

## 2019-03-15 ENCOUNTER — Ambulatory Visit: Payer: Self-pay | Attending: Family Medicine | Admitting: Family Medicine

## 2019-03-15 ENCOUNTER — Other Ambulatory Visit: Payer: Self-pay

## 2019-03-15 ENCOUNTER — Encounter: Payer: Self-pay | Admitting: Family Medicine

## 2019-03-15 VITALS — BP 119/74 | HR 90 | Temp 98.1°F | Ht 66.0 in | Wt 139.0 lb

## 2019-03-15 DIAGNOSIS — E1165 Type 2 diabetes mellitus with hyperglycemia: Secondary | ICD-10-CM

## 2019-03-15 DIAGNOSIS — R1024 Suprapubic pain: Secondary | ICD-10-CM

## 2019-03-15 DIAGNOSIS — E1142 Type 2 diabetes mellitus with diabetic polyneuropathy: Secondary | ICD-10-CM

## 2019-03-15 DIAGNOSIS — K5909 Other constipation: Secondary | ICD-10-CM

## 2019-03-15 DIAGNOSIS — R102 Pelvic and perineal pain: Secondary | ICD-10-CM

## 2019-03-15 LAB — POCT GLYCOSYLATED HEMOGLOBIN (HGB A1C): Hemoglobin A1C: 14.5 % — AB (ref 4.0–5.6)

## 2019-03-15 LAB — POCT CBG (FASTING - GLUCOSE)-MANUAL ENTRY: Glucose Fasting, POC: 572 mg/dL — AB (ref 70–99)

## 2019-03-15 MED ORDER — GABAPENTIN 600 MG PO TABS: ORAL_TABLET | ORAL | 3 refills | Status: DC

## 2019-03-15 MED ORDER — LANTUS SOLOSTAR 100 UNIT/ML ~~LOC~~ SOPN: 25.0000 [IU] | PEN_INJECTOR | Freq: Two times a day (BID) | SUBCUTANEOUS | 99 refills | Status: DC

## 2019-03-15 MED ORDER — HUMALOG KWIKPEN 100 UNIT/ML ~~LOC~~ SOPN: 10.0000 [IU] | PEN_INJECTOR | Freq: Three times a day (TID) | SUBCUTANEOUS | 99 refills | Status: DC

## 2019-03-15 MED ORDER — INSULIN ASPART 100 UNIT/ML ~~LOC~~ SOLN
10.0000 [IU] | Freq: Once | SUBCUTANEOUS | Status: AC
  Administered 2019-03-15: 10 [IU] via SUBCUTANEOUS

## 2019-03-15 MED ORDER — DULOXETINE HCL 30 MG PO CPEP: 30.0000 mg | ORAL_CAPSULE | Freq: Two times a day (BID) | ORAL | 3 refills | Status: DC

## 2019-03-15 MED FILL — ?DULoxetine HCL 60 MG CPEP: 60 | 30 days supply | Qty: 30 | Fill #0

## 2019-03-15 MED FILL — !HUMALOG 100 UNITS/ML KWIKP: 100 | 10 days supply | Qty: 3 | Fill #0

## 2019-03-15 MED FILL — !LANTUS SOLOSTAR 100UNITS/M: 100 | 12 days supply | Qty: 6 | Fill #0

## 2019-03-15 MED FILL — GABAPENTIN 600 MG TABLET: 600 | 30 days supply | Qty: 120 | Fill #0

## 2019-03-15 NOTE — Progress Notes (Signed)
Subjective:  Patient ID: [name] [name], female    DOB: 03/13/1961  Age: 58 y.o. MRN: [id number]  CC: New Patient (Initial Visit) and Establish Care   HPI [name] presents to  establish care. Patient has a history of poorly controlled diabetes. Per patient she was not feeling well yesterday- felt like her head was spinning and felt faint -had to hold onto things. Patient checked her blood sugar and the reading was just high so she took Lantus 30 units and 10 of Humalog and her BS on recheck was in the 400's. Patient was diagnosed with diabetes in 2011. Per patient she does have increased thirst and has lost weight since Jan when she was in the 170's and now in the 130's.  She does not feel as if she is having increased urinary frequency at this time.        Patient has moved in with her daughter about a month ago after losing her job due to missing work because of her health. Patient states that she had to detox from her antidepressants.  She does report ongoing anxiety as well as depressive symptoms related to her long-term health conditions and inability to afford medical care.  Blood sugars have not been controlled for more than a year. Has constant numbness and tingling in her feet- had a positive nerve test at pain management.  She reports increased pain at night when she is trying to sleep and has poor sleep secondary to pain.  She also has increased pain with prolonged walking or standing.  Pain always ranges between an 8 to a 10 on a 0-to-10 scale with 10 being the worst imaginable pain.        She is supposed to take Lantus 24 units twice per day and Humolog 8 units premeal. She did take this as directed about 4 years ago but did not keep up with it well after she started working.  She has not gotten any recent refills of insulin but believes that the insulin she took yesterday may have still been in date.  Gabapentin is the only medication that she takes on a regular basis as this helps  decrease her foot pain.  Past Medical History:  Diagnosis Date  . Cervical disc disorder with radiculopathy of cervical region   . Chest pain   . Chronic low back pain    HNP  . Chronic pain syndrome   . Depression dx 1997  . Depression   . Diabetes mellitus, type 2 ([place]) 2011   No insulin  . Diabetic polyneuropathy ([place])   . GERD (gastroesophageal reflux disease)   . Glaucoma   . Headache(784.0)   . Herniated disc   . Hypertension    Lab 11/2011:  CXR, EKG, CBC, TSH, BMet, troponin-normal; lipid profile: 188, 131, 36, 126   . Non-compliance   . Pancreatic cyst    Endoscopic aspiration in 09/2009  . Pneumonia 08/02/2012  . Shingles   . Vertigo     Past Surgical History:  Procedure Laterality Date  . ABDOMINAL HYSTERECTOMY    . CESAREAN SECTION      Family History  Problem Relation Age of Onset  . Depression Mother        type 1    Social History   Tobacco Use  . Smoking status: Former Smoker    Quit date: 08/03/1978    Years since quitting: 40.6  . Smokeless tobacco: Never Used  Substance Use Topics  . Alcohol  use: No    Comment: Former    ROS Review of Systems  Constitutional: Positive for fatigue and unexpected weight change. Negative for chills.  HENT: Negative for sore throat and trouble swallowing.   Eyes: Negative for photophobia and visual disturbance.  Respiratory: Negative for cough and shortness of breath.   Cardiovascular: Negative for chest pain, palpitations and leg swelling.  Gastrointestinal: Negative for abdominal pain, constipation, diarrhea and nausea.  Endocrine: Positive for polydipsia. Negative for polyphagia and polyuria.  Genitourinary: Negative for dysuria and frequency.  Musculoskeletal: Positive for arthralgias, back pain (Chronic low back pain) and gait problem.  Neurological: Positive for dizziness, light-headedness and numbness.  Hematological: Negative for adenopathy. Does not bruise/bleed easily.  Psychiatric/Behavioral:  Positive for sleep disturbance (Due to burning/tingling in feet). Negative for self-injury and suicidal ideas. The patient is nervous/anxious (Afraid to take medication as she had to wean herself cold [place] off of Effexor in the past due to lack of insurance).     Objective:   Today's Vitals: BP 119/74 (BP Location: Left Arm, Patient Position: Sitting, Cuff Size: Normal)   Pulse 90   Temp 98.1 F (36.7 C) (Oral)   Ht 5\' 6"  (1.676 m)   Wt 139 lb (63 kg)   SpO2 97%   BMI 22.44 kg/m   Physical Exam Vitals signs and nursing note reviewed.  Constitutional:      Appearance: Normal appearance.  Neck:     Musculoskeletal: Normal range of motion and neck supple. No muscular tenderness.  Cardiovascular:     Rate and Rhythm: Normal rate and regular rhythm.     Pulses:          Dorsalis pedis pulses are 1+ on the right side and 1+ on the left side.       Posterior tibial pulses are 1+ on the right side and 1+ on the left side.  Pulmonary:     Effort: Pulmonary effort is normal.     Breath sounds: Normal breath sounds.  Abdominal:     Palpations: Abdomen is soft.     Tenderness: There is abdominal tenderness (Mild suprapubic tenderness). There is no right CVA tenderness, left CVA tenderness, guarding or rebound.  Musculoskeletal:        General: No tenderness or deformity.     Right lower leg: No edema.     Left lower leg: No edema.     Right foot: Normal range of motion. No Charcot foot or foot drop.     Left foot: Normal range of motion. No Charcot foot or foot drop.  Feet:     Right foot:     Protective Sensation: 10 sites tested. 0 sites sensed.     Skin integrity: Callus and dry skin present.     Toenail Condition: Right toenails are abnormally thick.     Left foot:     Protective Sensation: 10 sites tested. 0 sites sensed.     Skin integrity: Callus and dry skin present.     Toenail Condition: Left toenails are abnormally thick.  Lymphadenopathy:     Cervical: No cervical  adenopathy.  Skin:    General: Skin is warm and dry.  Neurological:     Mental Status: She is alert and oriented to person, place, and time.     Sensory: Sensory deficit (Abnormal monofilament exam on the feet) present.  Psychiatric:        Mood and Affect: Mood normal.        Behavior:  Behavior normal.     Assessment & Plan:  1. Uncontrolled type 2 diabetes mellitus with hyperglycemia ([place]) She reports that she has been out of insulin due to financial issues.  Patient's glucose at today's visit was 572 fasting and patient was given regular insulin 10 units here in the office and patient is to start use of home insulin which was prescribed at today's visit.  She will have hemoglobin A1c, comprehensive metabolic panel, TSH and lipid panel as well as urinalysis at today's visit.  Urinalysis results were not available during visit due to malfunction of an office urinalysis machine.  Urinalysis was being done to check for presence of ketones.  Discussed with patient that I would like for her to monitor her blood sugars at home and bring her home glucometer along with blood sugar diary and meet with the clinical pharmacist in about 2 weeks so that changes can be made to help optimize her control of her blood sugars.  She has been asked to call the office tomorrow with her blood sugar results as well as to call if her blood sugars are staying above 200 fasting after starting medications.  Patient will start Lantus at 25 units twice daily as patient states that in the past once daily dosing did not seem to control her blood sugars for 24 hours.  She will also start 10 units of regular insulin prior to each meal.  Gabapentin will be refilled to help with peripheral neuropathic pain and patient agrees to take Cymbalta which can help with pain as well as depression.  Patient will follow-up in 2 weeks with the clinical pharmacist and has been asked to follow-up with me in 4 weeks but sooner if she has any  problems or concerns. - Glucose (CBG), Fasting - HgB A1c - Comprehensive metabolic panel - TSH - Lipid panel - Urinalysis, dipstick only - Amb Referral to Clinical Pharmacist - gabapentin (NEURONTIN) 600 MG tablet; 1 pill in the am, on mid-day/early evening and 2 at bedtime  Dispense: 120 tablet; Refill: 3 - DULoxetine (CYMBALTA) 30 MG capsule; Take 1 capsule (30 mg total) by mouth 2 (two) times daily.  Dispense: 60 capsule; Refill: 3 - Insulin Glargine (LANTUS SOLOSTAR) 100 UNIT/ML Solostar Pen; Inject 25 Units into the skin 2 (two) times daily. Increase dose by 1Unit each day your BG is over 150 in the morning.  Dispense: 5 pen; Refill: prn - HUMALOG KWIKPEN 100 UNIT/ML KwikPen; Inject 0.1 mLs (10 Units total) into the skin 3 (three) times daily.  Dispense: 15 mL; Refill: prn - insulin aspart (novoLOG) injection 10 Units  2. Diabetic polyneuropathy associated with type 2 diabetes mellitus [facility] [place]) Patient with complaint of painful diabetic neuropathy associated with her diabetes.  Prescriptions provided for gabapentin and after discussion, patient also agrees to start duloxetine/Cymbalta which she is aware can help with both pain and depression. - gabapentin (NEURONTIN) 600 MG tablet; 1 pill in the am, on mid-day/early evening and 2 at bedtime  Dispense: 120 tablet; Refill: 3 - DULoxetine (CYMBALTA) 30 MG capsule; Take 1 capsule (30 mg total) by mouth 2 (two) times daily.  Dispense: 60 capsule; Refill: 3  3. Suprapubic discomfort Patient will have send off urinalysis due to her suprapubic discomfort on exam.  Despite her high blood sugars, patient denies any urinary frequency.  4. Constipation, chronic Patient will have TSH done at today's visit in follow-up of her constipation.  She is encouraged to increase fiber in her diet as well as  increase daily water intake.  She may have some element of diabetic gastroparesis from long-term uncontrolled diabetes.  She is also been asked to apply for  financial assistance program through thisoffice as patient is also in need of colonoscopy in addition to ongoing medical care and is uninsured at this time.  Outpatient Encounter Medications as of 03/15/2019  Medication Sig  . acetaminophen (TYLENOL) 325 MG tablet Take 2 tablets (650 mg total) by mouth every 6 (six) hours as needed. (Patient taking differently: Take 650 mg by mouth every 6 (six) hours as needed for mild pain. )  . gabapentin (NEURONTIN) 600 MG tablet Take 600 mg by mouth 3 (three) times daily.  Marland Kitchen glucose blood test strip [employer] Testing Strips Use as instructed  . HUMALOG KWIKPEN 100 UNIT/ML KwikPen Inject 10 Units into the skin daily.  Marland Kitchen ibuprofen (ADVIL,MOTRIN) 200 MG tablet Take 1,000 mg by mouth daily as needed for mild pain.  . Insulin Glargine (LANTUS SOLOSTAR) 100 UNIT/ML Solostar Pen Inject 10 Units into the skin daily at 10 pm. Increase dose by 1Unit each day your BG is over 150 in the morning.  . Insulin Pen Needle (PEN NEEDLES) 31G X 8 MM MISC 1 Units by Does not apply route daily.  Marland Kitchen amitriptyline (ELAVIL) 25 MG tablet TAKE 1 TABLET(25 MG) BY MOUTH AT BEDTIME (Patient not taking: No sig reported)  . venlafaxine XR (EFFEXOR XR) 75 MG 24 hr capsule Take 1 capsule (75 mg total) daily with breakfast by mouth. (Patient not taking: Reported on 03/15/2019)   No facility-administered encounter medications on file as of 03/15/2019.    An After Visit Summary was printed and given to the patient.  Follow-up:  Return in about 4 weeks (around 04/12/2019) for  DM-2 weeks with Lurena Joiner; .   Antony Blackbird MD

## 2019-03-15 NOTE — Progress Notes (Signed)
CBG today was 572=FIRST CBG today was 516=second @ 10:20

## 2019-03-15 NOTE — Patient Instructions (Signed)
Blood Glucose Monitoring, Adult Monitoring your blood sugar (glucose) is an important part of managing your diabetes (diabetes mellitus). Blood glucose monitoring involves checking your blood glucose as often as directed and keeping a record (log) of your results over time. Checking your blood glucose regularly and keeping a blood glucose log can:  Help you and your health care provider adjust your diabetes management plan as needed, including your medicines or insulin.  Help you understand how food, exercise, illnesses, and medicines affect your blood glucose.  Let you know what your blood glucose is at any time. You can quickly find out if you have low blood glucose (hypoglycemia) or high blood glucose (hyperglycemia). Your health care provider will set individualized treatment goals for you. Your goals will be based on your age, other medical conditions you have, and how you respond to diabetes treatment. Generally, the goal of treatment is to maintain the following blood glucose levels:  Before meals (preprandial): 80-130 mg/dL (4.4-7.2 mmol/L).  After meals (postprandial): below 180 mg/dL (10 mmol/L).  A1c level: less than 7%. Supplies needed:  Blood glucose meter.  Test strips for your meter. Each meter has its own strips. You must use the strips that came with your meter.  A needle to prick your finger (lancet). Do not use a lancet more than one time.  A device that holds the lancet (lancing device).  A journal or log book to write down your results. How to check your blood glucose  1. Wash your hands with soap and water. 2. Prick the side of your finger (not the tip) with the lancet. Use a different finger each time. 3. Gently rub the finger until a small drop of blood appears. 4. Follow instructions that come with your meter for inserting the test strip, applying blood to the strip, and using your blood glucose meter. 5. Write down your result and any notes. Some meters  allow you to use areas of your body other than your finger (alternative sites) to test your blood. The most common alternative sites are:  Forearm.  Thigh.  Palm of the hand. If you think you may have hypoglycemia, or if you have a history of not knowing when your blood glucose is getting low (hypoglycemia unawareness), do not use alternative sites. Use your finger instead. Alternative sites may not be as accurate as the fingers, because blood flow is slower in these areas. This means that the result you get may be delayed, and it may be different from the result that you would get from your finger. Follow these instructions at home: Blood glucose log   Every time you check your blood glucose, write down your result. Also write down any notes about things that may be affecting your blood glucose, such as your diet and exercise for the day. This information can help you and your health care provider: ? Look for patterns in your blood glucose over time. ? Adjust your diabetes management plan as needed.  Check if your meter allows you to download your records to a computer. Most glucose meters store a record of glucose readings in the meter. If you have type 1 diabetes:  Check your blood glucose 2 or more times a day.  Also check your blood glucose: ? Before every insulin injection. ? Before and after exercise. ? Before meals. ? 2 hours after a meal. ? Occasionally between 2:00 a.m. and 3:00 a.m., as directed. ? Before potentially dangerous tasks, like driving or using heavy machinery. ?   At bedtime.  You may need to check your blood glucose more often, up to 6-10 times a day, if you: ? Use an insulin pump. ? Need multiple daily injections (MDI). ? Have diabetes that is not well-controlled. ? Are ill. ? Have a history of severe hypoglycemia. ? Have hypoglycemia unawareness. If you have type 2 diabetes:  If you take insulin or other diabetes medicines, check your blood glucose 2 or  more times a day.  If you are on intensive insulin therapy, check your blood glucose 4 or more times a day. Occasionally, you may also need to check between 2:00 a.m. and 3:00 a.m., as directed.  Also check your blood glucose: ? Before and after exercise. ? Before potentially dangerous tasks, like driving or using heavy machinery.  You may need to check your blood glucose more often if: ? Your medicine is being adjusted. ? Your diabetes is not well-controlled. ? You are ill. General tips  Always keep your supplies with you.  If you have questions or need help, all blood glucose meters have a 24-hour "hotline" phone number that you can call. You may also contact your health care provider.  After you use a few boxes of test strips, adjust (calibrate) your blood glucose meter by following instructions that came with your meter. Contact a health care provider if:  Your blood glucose is at or above 240 mg/dL (13.3 mmol/L) for 2 days in a row.  You have been sick or have had a fever for 2 days or longer, and you are not getting better.  You have any of the following problems for more than 6 hours: ? You cannot eat or drink. ? You have nausea or vomiting. ? You have diarrhea. Get help right away if:  Your blood glucose is lower than 54 mg/dL (3 mmol/L).  You become confused or you have trouble thinking clearly.  You have difficulty breathing.  You have moderate or large ketone levels in your urine. Summary  Monitoring your blood sugar (glucose) is an important part of managing your diabetes (diabetes mellitus).  Blood glucose monitoring involves checking your blood glucose as often as directed and keeping a record (log) of your results over time.  Your health care provider will set individualized treatment goals for you. Your goals will be based on your age, other medical conditions you have, and how you respond to diabetes treatment.  Every time you check your blood glucose,  write down your result. Also write down any notes about things that may be affecting your blood glucose, such as your diet and exercise for the day. This information is not intended to replace advice given to you by your health care provider. Make sure you discuss any questions you have with your health care provider. Document Released: 07/09/2003 Document Revised: 04/29/2018 Document Reviewed: 12/16/2015 Elsevier Patient Education  [place].  Insulin Storage and Care  All insulin pens and bottles (vials) have expiration dates. Insulin pens and vials that are refrigerated and unopened are good until the expiration date. Once opened, vials are good for 28 days. "Opened" means that the rubber has been punctured. Once in use, pen expiration dates vary depending on the type of insulin being used. Do not use insulin after the expiration date. Always follow the instructions that come with your insulin. How to store your insulin  Store your insulin according to instructions on the packaging.  If insulin is kept at room temperature, keep the temperature between 56-20F (  13-27C). ? Opened insulin vials may be kept at room temperature, or in the refrigerator and warmed to room temperature before use. ? Opened insulin pens should be kept at room temperature.  If insulin is kept in the refrigerator, keep the temperature between 36-88F (3-8C).  Do not freeze insulin.  Keep insulin away from direct heat or sunlight. How to throw away your supplies  Throw away your insulin if: ? It is discolored. ? It is thick. ? It has clumps in it. ? It has white particles suspended in it.  Discard all used needles in a puncture-proof sharps disposal container. You can ask your local pharmacy about where you can get this kind of disposal container, or you can use an empty liquid laundry detergent bottle that has a lid, for example.  Follow the disposal regulations for the area where you live.  Do not  use any syringe or needle more than one time.  Throw away empty vials and pens (with needles removed) in the regular trash. General recommendations  Always keep extra insulin and supplies with you.  Always inspect your insulin prior to injecting. Do not use if you notice any discoloration, particles, or clumping.  Mix cloudy insulin by gently rolling the vial between your hands or "rocking" the pen from end to end at least 10 times.  Do not leave insulin in your vehicle or in any place where it can get too hot or too cold.  Use an insulated travel pack to store your insulin vials or pens when traveling. Where to find more information  American Diabetes Association: www.diabetes.[employer] of Diabetes and Digestive and Kidney Diseases: [employer] Summary  Do not store insulin in extreme heat or cold, such as in the freezer, direct sunlight, or your vehicle.  Check the expiration date before using insulin and do not use insulin past the expiration date.  Check insulin before using to make sure it looks normal. Mix cloudy insulin before using. Do not use insulin if it is discolored, has particles, or clumps. This information is not intended to replace advice given to you by your health care provider. Make sure you discuss any questions you have with your health care provider. Document Released: 05/03/2009 Document Revised: 08/13/2016 Document Reviewed: 08/13/2016 Elsevier Patient Education  2020 [employer].

## 2019-03-16 LAB — COMPREHENSIVE METABOLIC PANEL WITH GFR
ALT: 32 IU/L (ref 0–32)
AST: 30 IU/L (ref 0–40)
Albumin/Globulin Ratio: 1.2 (ref 1.2–2.2)
Albumin: 4.2 g/dL (ref 3.8–4.9)
Alkaline Phosphatase: 159 IU/L — ABNORMAL HIGH (ref 39–117)
BUN/Creatinine Ratio: 21 (ref 9–23)
BUN: 15 mg/dL (ref 6–24)
Bilirubin Total: 0.3 mg/dL (ref 0.0–1.2)
CO2: 21 mmol/L (ref 20–29)
Calcium: 9.4 mg/dL (ref 8.7–10.2)
Chloride: 94 mmol/L — ABNORMAL LOW (ref 96–106)
Creatinine, Ser: 0.7 mg/dL (ref 0.57–1.00)
GFR calc Af Amer: 110 mL/min/1.73
GFR calc non Af Amer: 95 mL/min/1.73
Globulin, Total: 3.4 g/dL (ref 1.5–4.5)
Glucose: 479 mg/dL — ABNORMAL HIGH (ref 65–99)
Potassium: 4.3 mmol/L (ref 3.5–5.2)
Sodium: 134 mmol/L (ref 134–144)
Total Protein: 7.6 g/dL (ref 6.0–8.5)

## 2019-03-16 LAB — URINALYSIS, DIPSTICK ONLY
Bilirubin, UA: NEGATIVE
Leukocytes,UA: NEGATIVE
Nitrite, UA: NEGATIVE
Protein,UA: NEGATIVE
RBC, UA: NEGATIVE
Urobilinogen, Ur: 0.2 mg/dL (ref 0.2–1.0)
pH, UA: 5.5 (ref 5.0–7.5)

## 2019-03-16 LAB — LIPID PANEL
Chol/HDL Ratio: 5.5 ratio — ABNORMAL HIGH (ref 0.0–4.4)
Cholesterol, Total: 197 mg/dL (ref 100–199)
HDL: 36 mg/dL — ABNORMAL LOW
LDL Calculated: 109 mg/dL — ABNORMAL HIGH (ref 0–99)
Triglycerides: 260 mg/dL — ABNORMAL HIGH (ref 0–149)
VLDL Cholesterol Cal: 52 mg/dL — ABNORMAL HIGH (ref 5–40)

## 2019-03-16 LAB — TSH: TSH: 2.51 u[IU]/mL (ref 0.450–4.500)

## 2019-03-29 ENCOUNTER — Ambulatory Visit: Payer: Self-pay | Admitting: Pharmacist

## 2019-03-30 ENCOUNTER — Ambulatory Visit: Payer: Self-pay | Attending: Family Medicine

## 2019-03-30 ENCOUNTER — Other Ambulatory Visit: Payer: Self-pay

## 2019-03-31 ENCOUNTER — Encounter (HOSPITAL_COMMUNITY): Payer: Self-pay | Admitting: Emergency Medicine

## 2019-03-31 ENCOUNTER — Other Ambulatory Visit: Payer: Self-pay

## 2019-03-31 ENCOUNTER — Emergency Department (HOSPITAL_COMMUNITY)
Admission: EM | Admit: 2019-03-31 | Discharge: 2019-03-31 | Disposition: A | Discharge status: Home / Self Care | Payer: Self-pay | Attending: Emergency Medicine | Admitting: Emergency Medicine

## 2019-03-31 ENCOUNTER — Emergency Department (HOSPITAL_COMMUNITY): Payer: Self-pay

## 2019-03-31 DIAGNOSIS — E860 Dehydration: Secondary | ICD-10-CM | POA: Insufficient documentation

## 2019-03-31 DIAGNOSIS — I10 Essential (primary) hypertension: Secondary | ICD-10-CM | POA: Insufficient documentation

## 2019-03-31 DIAGNOSIS — E119 Type 2 diabetes mellitus without complications: Secondary | ICD-10-CM | POA: Insufficient documentation

## 2019-03-31 DIAGNOSIS — Z794 Long term (current) use of insulin: Secondary | ICD-10-CM | POA: Insufficient documentation

## 2019-03-31 DIAGNOSIS — R531 Weakness: Secondary | ICD-10-CM | POA: Insufficient documentation

## 2019-03-31 DIAGNOSIS — R55 Syncope and collapse: Secondary | ICD-10-CM | POA: Insufficient documentation

## 2019-03-31 DIAGNOSIS — Z79899 Other long term (current) drug therapy: Secondary | ICD-10-CM | POA: Insufficient documentation

## 2019-03-31 DIAGNOSIS — K5900 Constipation, unspecified: Secondary | ICD-10-CM | POA: Insufficient documentation

## 2019-03-31 DIAGNOSIS — I951 Orthostatic hypotension: Secondary | ICD-10-CM

## 2019-03-31 DIAGNOSIS — Z87891 Personal history of nicotine dependence: Secondary | ICD-10-CM | POA: Insufficient documentation

## 2019-03-31 LAB — COMPREHENSIVE METABOLIC PANEL
ALT: 27 U/L (ref 0–44)
AST: 26 U/L (ref 15–41)
Albumin: 3.5 g/dL (ref 3.5–5.0)
Alkaline Phosphatase: 110 U/L (ref 38–126)
Anion gap: 12 (ref 5–15)
BUN: 17 mg/dL (ref 6–20)
CO2: 25 mmol/L (ref 22–32)
Calcium: 9.2 mg/dL (ref 8.9–10.3)
Chloride: 98 mmol/L (ref 98–111)
Creatinine, Ser: 0.73 mg/dL (ref 0.44–1.00)
GFR calc Af Amer: >60 mL/min (ref >60)
GFR calc non Af Amer: >60 mL/min (ref >60)
Glucose, Bld: 310 mg/dL — ABNORMAL HIGH (ref 70–99)
Potassium: 3.8 mmol/L (ref 3.5–5.1)
Sodium: 135 mmol/L (ref 135–145)
Total Bilirubin: 0.9 mg/dL (ref 0.3–1.2)
Total Protein: 7.4 g/dL (ref 6.5–8.1)

## 2019-03-31 LAB — CBC
HCT: 43.9 % (ref 36.0–46.0)
Hemoglobin: 14.6 g/dL (ref 12.0–15.0)
MCH: 31.2 pg (ref 26.0–34.0)
MCHC: 33.3 g/dL (ref 30.0–36.0)
MCV: 93.8 fL (ref 80.0–100.0)
Platelets: 247 10*3/uL (ref 150–400)
RBC: 4.68 MIL/uL (ref 3.87–5.11)
RDW: 11.9 % (ref 11.5–15.5)
WBC: 8.3 10*3/uL (ref 4.0–10.5)
nRBC: 0 % (ref 0.0–0.2)

## 2019-03-31 LAB — URINALYSIS, ROUTINE W REFLEX MICROSCOPIC
Bilirubin Urine: NEGATIVE
Glucose, UA: 150 mg/dL — AB
Hgb urine dipstick: NEGATIVE
Ketones, ur: 20 mg/dL — AB
Leukocytes,Ua: NEGATIVE
Nitrite: NEGATIVE
Protein, ur: NEGATIVE mg/dL
Specific Gravity, Urine: >1.046 — ABNORMAL HIGH (ref 1.005–1.030)
pH: 7 (ref 5.0–8.0)

## 2019-03-31 LAB — I-STAT BETA HCG BLOOD, ED (MC, WL, AP ONLY): I-stat hCG, quantitative: 6.1 m[IU]/mL — ABNORMAL HIGH (ref <5)

## 2019-03-31 LAB — LIPASE, BLOOD: Lipase: 22 U/L (ref 11–51)

## 2019-03-31 LAB — PREGNANCY, URINE: Preg Test, Ur: NEGATIVE

## 2019-03-31 MED ORDER — ONDANSETRON HCL 4 MG/2ML IJ SOLN
4.0000 mg | Freq: Once | INTRAMUSCULAR | Status: AC
  Administered 2019-03-31: 4 mg via INTRAVENOUS
  Filled 2019-03-31: qty 2

## 2019-03-31 MED ORDER — SODIUM CHLORIDE 0.9% FLUSH: 3.0000 mL | Freq: Once | INTRAVENOUS | Status: DC

## 2019-03-31 MED ORDER — ONDANSETRON 4 MG PO TBDP: 4.0000 mg | ORAL_TABLET | Freq: Three times a day (TID) | ORAL | 0 refills | Status: DC | PRN

## 2019-03-31 MED ORDER — LACTATED RINGERS IV BOLUS
1000.0000 mL | Freq: Once | INTRAVENOUS | Status: AC
  Administered 2019-03-31: 1000 mL via INTRAVENOUS

## 2019-03-31 MED ORDER — ACETAMINOPHEN 500 MG PO TABS
1000.0000 mg | ORAL_TABLET | Freq: Once | ORAL | Status: AC
  Administered 2019-03-31: 1000 mg via ORAL
  Filled 2019-03-31: qty 2

## 2019-03-31 MED ORDER — IOHEXOL 300 MG/ML  SOLN
100.0000 mL | Freq: Once | INTRAMUSCULAR | Status: AC | PRN
  Administered 2019-03-31: 100 mL via INTRAVENOUS

## 2019-03-31 NOTE — ED Notes (Signed)
Ambulated patient in hallway, patient had no complaints.  Observed patient with a steady gait.

## 2019-03-31 NOTE — ED Triage Notes (Signed)
Nausea and weakness since wed states stayed in bed , has been constipated , feels bad

## 2019-03-31 NOTE — Progress Notes (Signed)
Consult request has been received. CSW attempting to follow up at present time  Orlandus Borowski M. Gracin Soohoo LCSWA Transitions of Care  Clinical Social Worker  Ph: [phone] 

## 2019-03-31 NOTE — ED Notes (Signed)
Patient transported to CT 

## 2019-03-31 NOTE — ED Provider Notes (Signed)
[place] EMERGENCY DEPARTMENT Provider Note   CSN: [id number] Arrival date & time: 03/31/19  1235     History   Chief Complaint Chief Complaint  Patient presents with   Nausea   Dizziness    HPI [name] [name] is a 58 y.o. female with a past medical history of type 2 diabetes, depression presents emergency department with several days of fatigue and a syncopal episode. Patient reports for the last several days she is felt fatigued, lightheaded when ambulating, and nauseous.  Patient reports she had a syncopal episode 2 days ago while after getting up to go to the bathroom.  Patient reports she did not have any preceding chest pain or diaphoresis and was feeling lightheaded prior to having the syncopal episode.  Patient reports feeling lightheaded and nauseous whenever she changes positions in the last several days.  Patient reports she has had constipation for a while but she is unsure of for how long, patient reports she had a bowel movement earlier today that was small and hard.  Patient denies pain when she pees.  Patient denies vomiting.  Patient denies abdominal pain, fever, or shortness of breath.  Patient reports her sugars have been elevated recently and she has been under increased stress due to losing her job and health insurance and having dental issues.  Patient denies thoughts of wanting to harm herself.    The history is provided by the patient.    Past Medical History:  Diagnosis Date   Cervical disc disorder with radiculopathy of cervical region    Chest pain    Chronic low back pain    HNP   Chronic pain syndrome    Depression dx 1997   Depression    Diabetes mellitus, type 2 ([place]) 2011   No insulin   Diabetic polyneuropathy (H[facility])    GERD (gastroesophageal reflux disease)    Glaucoma    Headache(784.0)    Herniated disc    Hypertension    Lab 11/2011:  CXR, EKG, CBC, TSH, BMet, troponin-normal; lipid profile: 188, 131, 36, 126     Non-compliance    Pancreatic cyst    Endoscopic aspiration in 09/2009   Pneumonia 08/02/2012   Shingles    Vertigo     Patient Active Problem List   Diagnosis Date Noted   Chronic pain syndrome 08/24/2017   Diabetic polyneuropathy associated with type 2 diabetes mellitus ([place]) 08/24/2017   Restless leg syndrome 08/24/2017   Vertigo 04/09/2016   Acute sinusitis 06/27/2015   Cervical disc disorder with radiculopathy of cervical region 02/20/2015   Neck muscle spasm 10/23/2014   Healthcare maintenance 10/23/2014   Depression 11/25/2013   Diabetes type 2, uncontrolled ([place])     Past Surgical History:  Procedure Laterality Date   ABDOMINAL HYSTERECTOMY     CESAREAN SECTION       OB History   No obstetric history on file.      Home Medications    Prior to Admission medications   Medication Sig Start Date End Date Taking? Authorizing Provider  DULoxetine (CYMBALTA) 30 MG capsule Take 1 capsule (30 mg total) by mouth 2 (two) times daily. 03/15/19  Yes Fulp, Cammie, MD  gabapentin (NEURONTIN) 600 MG tablet 1 pill in the am, on mid-day/early evening and 2 at bedtime Patient taking differently: Take 600-1,200 mg by mouth See admin instructions. 1 pill in the am, on mid-day/early evening and 2 at bedtime 03/15/19  Yes Fulp, Cammie, MD  glucose blood test  strip [facility] Testing Strips Use as instructed 08/18/17  Yes Lockamy, Timothy, DO  HUMALOG KWIKPEN 100 UNIT/ML KwikPen Inject 0.1 mLs (10 Units total) into the skin 3 (three) times daily. 03/15/19  Yes Fulp, Cammie, MD  ibuprofen (ADVIL,MOTRIN) 200 MG tablet Take 800 mg by mouth daily as needed for mild pain.    Yes [provider]  Insulin Glargine (LANTUS SOLOSTAR) 100 UNIT/ML Solostar Pen Inject 25 Units into the skin 2 (two) times daily. Increase dose by 1Unit each day your BG is over 150 in the morning. 03/15/19  Yes Fulp, Cammie, MD  Insulin Pen Needle (PEN NEEDLES) 31G X 8 MM MISC 1 Units by Does not  apply route daily. 08/20/17  Yes Nuala Alpha, DO  acetaminophen (TYLENOL) 325 MG tablet Take 2 tablets (650 mg total) by mouth every 6 (six) hours as needed. Patient not taking: Reported on 03/31/2019 08/18/17   Nuala Alpha, DO  amitriptyline (ELAVIL) 25 MG tablet TAKE 1 TABLET(25 MG) BY MOUTH AT BEDTIME Patient not taking: No sig reported 01/24/18   Nuala Alpha, DO  ondansetron (ZOFRAN ODT) 4 MG disintegrating tablet Take 1 tablet (4 mg total) by mouth every 8 (eight) hours as needed for nausea or vomiting. 03/31/19   Ben Habermann, Missy Sabins, MD    Family History Family History  Problem Relation Age of Onset   Depression Mother        type 1   Diabetes Mother    Renal Disease Mother    Lung cancer Father     Social History Social History   Tobacco Use   Smoking status: Former Smoker    Quit date: 08/03/1978    Years since quitting: 40.6   Smokeless tobacco: Never Used  Substance Use Topics   Alcohol use: No    Comment: Former   Drug use: No     Allergies   Tramadol   Review of Systems Review of Systems  Constitutional: Positive for appetite change and fatigue. Negative for fever.  HENT: Negative for congestion and trouble swallowing.   Eyes: Negative for visual disturbance.  Respiratory: Negative for cough and shortness of breath.   Cardiovascular: Negative for chest pain.  Gastrointestinal: Positive for constipation and nausea. Negative for abdominal pain, diarrhea and vomiting.  Genitourinary: Positive for decreased urine volume. Negative for dysuria.  Musculoskeletal: Negative for back pain.  Skin: Negative for rash.  Neurological: Positive for syncope and light-headedness. Negative for tremors, speech difficulty, numbness and headaches.  Psychiatric/Behavioral: Positive for dysphoric mood. Negative for confusion.     Physical Exam Updated Vital Signs BP 124/76    Pulse 99    Temp 98.3 F (36.8 C)    Resp 11    Ht _0  (1.676 m)    Wt 63 kg     SpO2 100%    BMI 22.44 kg/m   Physical Exam Constitutional:      General: She is not in acute distress. HENT:     Head: Normocephalic and atraumatic.     Right Ear: External ear normal.     Left Ear: External ear normal.     Nose: Nose normal.     Mouth/Throat:     Mouth: Mucous membranes are moist.     Pharynx: Oropharynx is clear.  Eyes:     Pupils: Pupils are equal, round, and reactive to light.  Neck:     Musculoskeletal: Neck supple.  Cardiovascular:     Rate and Rhythm: Regular rhythm. Tachycardia present.  Pulses: Normal pulses.  Pulmonary:     Effort: Pulmonary effort is normal. No respiratory distress.     Breath sounds: Normal breath sounds. No wheezing, rhonchi or rales.  Chest:     Chest wall: No tenderness.  Abdominal:     Palpations: Abdomen is soft.     Tenderness: There is abdominal tenderness (LLQ). There is no guarding or rebound.  Musculoskeletal:     Right lower leg: No edema.     Left lower leg: No edema.  Skin:    General: Skin is warm and dry.  Neurological:     General: No focal deficit present.     Mental Status: She is alert and oriented to person, place, and time.     Cranial Nerves: No cranial nerve deficit.     Sensory: No sensory deficit.     Motor: No weakness.     Coordination: Coordination normal.     Gait: Gait normal.      ED Treatments / Results  Labs (all labs ordered are listed, but only abnormal results are displayed) Labs Reviewed  COMPREHENSIVE METABOLIC PANEL - Abnormal; Notable for the following components:      Result Value   Glucose, Bld 310 (*)    All other components within normal limits  URINALYSIS, ROUTINE W REFLEX MICROSCOPIC - Abnormal; Notable for the following components:   Specific Gravity, Urine >1.046 (*)    Glucose, UA 150 (*)    Ketones, ur 20 (*)    All other components within normal limits  I-STAT BETA HCG BLOOD, ED ([facility], [facility], [facility] ONLY) - Abnormal; Notable for the following components:   I-stat  hCG, quantitative 6.1 (*)    All other components within normal limits  LIPASE, BLOOD  CBC  PREGNANCY, URINE    EKG Sinus tachycardia, QTC of 469, similar to prior EKGs without signs of acute ischemic changes.  Radiology Ct Abdomen Pelvis W Contrast  Result Date: 03/31/2019 CLINICAL DATA:  Nausea and weakness since Wednesday. EXAM: CT ABDOMEN AND PELVIS WITH CONTRAST TECHNIQUE: Multidetector CT imaging of the abdomen and pelvis was performed using the standard protocol following bolus administration of intravenous contrast. CONTRAST:  189m OMNIPAQUE IOHEXOL 300 MG/ML  SOLN COMPARISON:  02/18/2017.  Abdominal MRI 09/03/2009. FINDINGS: Lower chest: Unremarkable Hepatobiliary: No suspicious focal abnormality within the liver parenchyma. There is no evidence for gallstones, gallbladder wall thickening, or pericholecystic fluid. No intrahepatic or extrahepatic biliary dilation. Pancreas: Near the junction of the body and tail measures 3.2 x 1.5 x 1.8 cm and has a component that measures water density (coronal 37/6) and a second component that may enhance (coronal 41/6). No dilatation of the main duct. Spleen: No splenomegaly. No focal mass lesion. Adrenals/Urinary Tract: No adrenal nodule or mass. Kidneys unremarkable. No evidence for hydroureter. The urinary bladder appears normal for the degree of distention. Stomach/Bowel: Stomach is unremarkable. No gastric wall thickening. No evidence of outlet obstruction. Duodenum is normally positioned as is the ligament of Treitz. No small bowel wall thickening. No small bowel dilatation. The terminal ileum is normal. The appendix is not visualized, but there is no edema or inflammation in the region of the cecum. No gross colonic mass. No colonic wall thickening. Vascular/Lymphatic: There is abdominal aortic atherosclerosis without aneurysm. There is no gastrohepatic or hepatoduodenal ligament lymphadenopathy. No intraperitoneal or retroperitoneal lymphadenopathy.  No pelvic sidewall lymphadenopathy. Reproductive: The uterus is surgically absent. There is no adnexal mass. Other: No intraperitoneal free fluid. Musculoskeletal: No worrisome lytic or  sclerotic osseous abnormality. IMPRESSION: 1. 3.2 x 1.5 x 1.8 cm mixed attenuation lesion identified in the pancreas near the junction of the body and tail. No associated dilatation of the main pancreatic duct. This lesion was evaluated on MRI of 09/03/2009 when it measured 3.2 x 2.9 x 2.5 cm indicating some involution over the almost 10 year interval since that study. As such, this is compatible with benign etiology. 2. Otherwise unremarkable exam without acute abnormality. Electronically Signed   By: Misty Stanley M.D.   On: 03/31/2019 20:08    Procedures Procedures (including critical care time)  Medications Ordered in ED Medications  sodium chloride flush (NS) 0.9 % injection 3 mL (has no administration in time range)  lactated ringers bolus 1,000 mL (0 mLs Intravenous Stopped 03/31/19 2259)  ondansetron (ZOFRAN) injection 4 mg (4 mg Intravenous Given 03/31/19 1737)  lactated ringers bolus 1,000 mL (0 mLs Intravenous Stopped 03/31/19 2259)  acetaminophen (TYLENOL) tablet 1,000 mg (1,000 mg Oral Given 03/31/19 1919)  iohexol (OMNIPAQUE) 300 MG/ML solution 100 mL (100 mLs Intravenous Contrast Given 03/31/19 1936)     Initial Impression / Assessment and Plan / ED Course  I have reviewed the triage vital signs and the nursing notes.  Pertinent labs & imaging results that were available during my care of the patient were reviewed by me and considered in my medical decision making (see chart for details).       Patient with elevated blood sugar without anion gap and orthostatic vital signs on arrival.  Concern for dehydration in the setting of poor p.o. intake.  Patient syncopal episode sounds orthostatic by patient's history.  Patient with some left lower quadrant abdominal pain on physical exam, CT of the abdomen  pelvis with contrast ordered to further evaluate which did not show any acute intra-abdominal pathology.  Patient given 2 L of IV fluid and Zofran with improvement in her symptoms.  Patient able to ambulate in the emergency department without difficulty after IV fluid.  Social worker met with patient and provided follow-up and dental resources.  All questions answered and strict return precautions given.  Patient comfortable with plan to continue symptomatic care, encourage p.o. fluids, and closely follow-up as an outpatient.  Patient seen and plan discussed with Dr. Laverta Baltimore.  Final Clinical Impressions(s) / ED Diagnoses   Final diagnoses:  Dehydration  Syncope, unspecified syncope type  Orthostatic hypotension    ED Discharge Orders         Ordered    ondansetron (ZOFRAN ODT) 4 MG disintegrating tablet  Every 8 hours PRN     03/31/19 2258           Betsey Amen, MD 04/01/19 0129    Margette Fast, MD 04/01/19 [phone]

## 2019-03-31 NOTE — ED Notes (Signed)
Patient verbalized understanding of dc instructions, vss, ambulatory with nad.   

## 2019-03-31 NOTE — ED Notes (Signed)
Upon this RN entering the room, patient tearful, stating she has been waiting for hours and has had no update on plan of care and has not been for her CT yet. This RN apologized to patient for her wait and explained plan of care thus far. This RN contacted CT for an updated time of when they may be able to take patient over for her scan, CT tech advised this RN that patient is next on their list. Pt updated.

## 2019-04-12 ENCOUNTER — Encounter: Payer: Self-pay | Admitting: Family Medicine

## 2019-04-12 ENCOUNTER — Other Ambulatory Visit: Payer: Self-pay

## 2019-04-12 ENCOUNTER — Ambulatory Visit: Payer: Self-pay | Attending: Family Medicine | Admitting: Family Medicine

## 2019-04-12 DIAGNOSIS — Z09 Encounter for follow-up examination after completed treatment for conditions other than malignant neoplasm: Secondary | ICD-10-CM

## 2019-04-12 DIAGNOSIS — R55 Syncope and collapse: Secondary | ICD-10-CM

## 2019-04-12 DIAGNOSIS — E1165 Type 2 diabetes mellitus with hyperglycemia: Secondary | ICD-10-CM

## 2019-04-12 DIAGNOSIS — F331 Major depressive disorder, recurrent, moderate: Secondary | ICD-10-CM

## 2019-04-12 MED FILL — GABAPENTIN 600 MG TABLET: 600 | 30 days supply | Qty: 120 | Fill #1

## 2019-04-12 MED FILL — ?DULOXETINE HCL 60 MG CPEP: 60 | 30 days supply | Qty: 30 | Fill #1

## 2019-04-12 NOTE — Progress Notes (Signed)
Pt. Is following up on Diabetes  Pt. Stated she do no have anymore test strips. Need Refills.

## 2019-04-12 NOTE — Progress Notes (Signed)
Virtual Visit via Telephone Note  I connected with [name] [name] on 04/12/19 at  4:10 PM EDT by telephone and verified that I am speaking with the correct person using two identifiers.   I discussed the limitations, risks, security and privacy concerns of performing an evaluation and management service by telephone and the availability of in person appointments. I also discussed with the patient that there may be a patient responsible charge related to this service. The patient expressed understanding and agreed to proceed.  Patient Location: Home Provider Location: [facility] Others participating in call: call initiated by Francisco Capuchin, CMA who then transferred the call   History of Present Illness:      58 year old female who is status post recent visit on 03/15/2019 to establish care.  Patient with history of uncontrolled diabetes due to noncompliance with medication regimen due to financial issues as well as depression..  Since her last visit, patient reports an episode of feeling weak and passing out on 03/15/2019.  Patient states that she had gone to the bathroom and when she was walking back from the bathroom she began to feel weak and lightheaded and sat down in a chair in her living room and was told that she passed out.  She was seen and evaluated in the emergency department and given fluids with diagnosis of dehydration.  Patient states that she did feel better after receiving fluids in the emergency department and she has had no further sensation of feeling as if she might pass out.  She continues to have issues with elevated blood sugars but has restarted the use of her insulin since her last visit.  She is taking her long-acting insulin but forgets to take pre-meal insulin.  She does intend to try and start taking pre-meal insulin for better control of blood sugars.  She continues to have issues with chronic neuropathic pain in her feet due to her diabetes.  She reports continued fatigue.  She  does have some increased thirst and urinary frequency but this has been improved since restarting insulin.       She reports continued issues with depression for more than a year after losing her job due to her health issues and then not having health insurance or the financial resources for medications and health care follow-up.  She also has current dental issues but states that she was given information on resources during her recent ED visit.  She reports that she would be agreeable to speaking with the social worker from this office to get set up with mental health treatment of her depression.       On further review of systems, she denies any issues with chest pain or palpitations, no cough, occasional mild shortness of breath with exertion, no abdominal pain but occasional nausea and occasional constipation.  Continued chronic neck, back and neuropathic pain in her legs and feet.  Occasional headaches and dizziness but dizziness improved status post receiving fluids in the emergency department.  She denies suicidal thoughts or ideations but continues to feel depressed over her current health and financial situation.  Past Medical History:  Diagnosis Date  . Cervical disc disorder with radiculopathy of cervical region   . Chest pain   . Chronic low back pain    HNP  . Chronic pain syndrome   . Depression dx 1997  . Depression   . Diabetes mellitus, type 2 ([place]) 2011   No insulin  . Diabetic polyneuropathy ([place])   .  GERD (gastroesophageal reflux disease)   . Glaucoma   . Headache(784.0)   . Herniated disc   . Hypertension    Lab 11/2011:  CXR, EKG, CBC, TSH, BMet, troponin-normal; lipid profile: 188, 131, 36, 126   . Non-compliance   . Pancreatic cyst    Endoscopic aspiration in 09/2009  . Pneumonia 08/02/2012  . Shingles   . Vertigo     Past Surgical History:  Procedure Laterality Date  . ABDOMINAL HYSTERECTOMY    . CESAREAN SECTION      Family History  Problem Relation Age of  Onset  . Depression Mother        type 1  . Diabetes Mother   . Renal Disease Mother   . Lung cancer Father     Social History   Tobacco Use  . Smoking status: Former Smoker    Quit date: 08/03/1978    Years since quitting: 40.7  . Smokeless tobacco: Never Used  Substance Use Topics  . Alcohol use: No    Comment: Former  . Drug use: No     Allergies  Allergen Reactions  . Tramadol Nausea And Vomiting    REACTION: Projectile vomiting       Observations/Objective: No vital signs or physical exam conducted as visit was done via telephone  Assessment and Plan: 1. Uncontrolled type 2 diabetes mellitus with hyperglycemia ([place]) Patient reports that she is now taking her long-acting insulin and plans to become compliant with use of short-term insulin as well to better improve the control of her diabetes.  Patient's hemoglobin A1c at her new patient visit on 03/15/2019 was 14.5.  During her recent emergency department visit, her blood sugar was 310 with an otherwise normal comprehensive metabolic panel.  She is encouraged to be compliant with all medications and resume monitoring of her blood sugars to help with better control.  She has been referred to social work for any needed resources with obtaining her medications. - Ambulatory referral to Social Work  2. Syncope, unspecified syncope type 3. Encounter for examination following treatment at hospital Patient is status post emergency department visit on 03/31/2019 for syncopal episode which per emergency department notes was thought to be secondary to dehydration.  Patient reports that she does feel better status post receiving fluids at the emergency department.  She is encouraged to remain compliant with medications especially with efforts to lower her blood sugars and obtain better control of her blood sugars.  4. Moderate episode of recurrent major depressive disorder [facility]) Patient with chronic depression related to social and  financial issues.  Patient reports that about a year ago she lost her job along with her health insurance and stopped taking any of her medications.  She is now living with family members who are trying to help her with her financial issues and obtaining medication and with her medication compliance.  Discussed mental health referral to which patient is amenable and she will be contacted by the social worker from this office to assist with facilitating further mental health follow-up as well with any other resources that may be available to help the patient. - Ambulatory referral to Social Work  Follow Up Instructions: 2 months or 8 to 10 weeks as well as when needed  I discussed the assessment and treatment plan with the patient. The patient was provided an opportunity to ask questions and all were answered. The patient agreed with the plan and demonstrated an understanding of the instructions.   The patient  was advised to call back or seek an in-person evaluation if the symptoms worsen or if the condition fails to improve as anticipated.  I provided 15 minutes of non-face-to-face time during this encounter.   Antony Blackbird, MD

## 2019-04-15 ENCOUNTER — Encounter: Payer: Self-pay | Admitting: Family Medicine

## 2019-04-18 ENCOUNTER — Telehealth: Payer: Self-pay | Admitting: Family Medicine

## 2019-04-18 ENCOUNTER — Ambulatory Visit: Payer: Self-pay | Admitting: Pharmacist

## 2019-04-18 NOTE — Telephone Encounter (Signed)
Pt was sent a letter from financial dept. Inform them, that the application they submitted was incomplete, since they were missing some documentation at the time of the appointment, Pt need to reschedule and resubmit all new papers and application for [employer] and [employer], P.S. old documents has been sent back by mail to the Pt and Pt. need to make a new appt. 

## 2019-04-19 ENCOUNTER — Telehealth: Payer: Self-pay | Admitting: Licensed Clinical Social Worker

## 2019-04-19 NOTE — Telephone Encounter (Signed)
Call placed to patient regarding [facility] referral. Pt's voicemail has not been set up; LCSW was unable to leave message for return call.

## 2019-04-24 ENCOUNTER — Ambulatory Visit: Payer: Self-pay | Attending: Family Medicine | Admitting: Licensed Clinical Social Worker

## 2019-04-24 ENCOUNTER — Other Ambulatory Visit: Payer: Self-pay

## 2019-04-24 DIAGNOSIS — F419 Anxiety disorder, unspecified: Secondary | ICD-10-CM

## 2019-04-24 DIAGNOSIS — F331 Major depressive disorder, recurrent, moderate: Secondary | ICD-10-CM

## 2019-04-26 NOTE — BH Specialist Note (Addendum)
Integrated Behavioral Health Initial Visit  MRN: [id number] Name: [name] [name]  Number of [place] Clinician visits:: 1/6 Session Start time: 10:50am  Session End time: 12:10pm Total time: 1 hour, 20 min  Type of Service: [place] Interpretor:No. Interpretor Name and Language: N/A   Warm Hand Off Completed per follow up from 04/12/19 appointment with Dr. Chapman Fitch       SUBJECTIVE: [name] [name] is a 58 y.o. female accompanied by self Patient was referred by Dr. Chapman Fitch for depression symptoms. Patient reports the following symptoms/concerns: Depressed mood and financial stress. Duration of problem: 6 months; Severity of problem: severe  OBJECTIVE: Mood: Depressed and Affect: Appropriate and Depressed Risk of harm to self or others: No plan to harm self or others  LIFE CONTEXT: Family and Social: Patient is living with daughter, son in law, and two grandchildren after losing her trailer due to financial strain. School/Work: Participant is not employed or insured; Patient lost her job in June 2020 due to health issues (reported difficulty remembering tasks and was late to work due to confusion about job schedule).  Self-Care: Patient enjoys sitting outside  Patient uses a calm app on her phone  Patient talks with her ex boyfriend for support Patient spends time with daughter's dog '[place]' for support Patient walks for exercise Life Changes: Patient recently lost her job and subsequently lost health insurance through her employer in June, 2020. Patient moved in with her daughter in May 2020 in response to family concerns about S/I. Patient shares a room with her 76 year old granddaughter.   GOALS ADDRESSED: Patient will: 1. Reduce symptoms of: depression, anxiety, and financial stress. 2. Increase knowledge and/or ability of: coping skills and stress reduction  3. Demonstrate ability to: Increase healthy adjustment to current life  circumstances and Improve medication compliance  INTERVENTIONS: Interventions utilized: Supportive Counseling and Medication Monitoring  Standardized Assessments completed: GAD-7 and PHQ 9  ASSESSMENT: Discussed IBH services provided in clinic. Patient currently experiencing severe symptoms of both depression and anxiety as evidenced by low self worth, guilt of relying on family members for support, and financial stress. Pt began taking Cymbalta two weeks ago. Pt was previously prescribed Effexor which she stopped taking abruptly after losing her health insurance. Patient was hesitant to begin taking Cymbalta per. Dr. Chapman Fitch due to withdrawal effects she experienced when stopping previous medication, Effexor. Behavioral Health Intern provided psycho education about the importance of tapering off of antidepressants and explained that her withdrawal symptoms were likely due to her abrupt stop. Patient reports compliance with taking Cymbalta but has not experienced relief from depression or anxiety as of now. BHI also normalized feelings of depression and identified and encouraged patient use of healthy coping skills.  BHI performed a risk assessment. Patient reports past suicidal ideation and plan. Patient reports current vague suicidal ideation but denies current suicidal intent or plan, stating "I don't have the guts to do it". Patient's best friend completed suicide 2 years ago and her uncle also completed suicide in the past. BHI provided patient with crisis resources and identified reasons for living. Patient reports her grandchildren and her son as reasons to live. Patient also reports that she feels comfortable talking with her ex-boyfriend about her feelings of depression and anxiety.  BHI provided patient with a [facility] financial assistance application to resolve her recent medical bills from ED and [facility]. Patient has met with Clifton James Public house manager) in the past and is aware of needed documents and  next  steps to apply for financial assistance. Patient additionally reported that she needs dental work.    Patient may benefit from medication compliance, applying for [facility] financial assistance, and continued utilization of coping skills including social support and walking.  PLAN: 1. Follow up with behavioral health clinician on :05/08/19 for f/u appointment. 2. Behavioral recommendations: Adherence to medication management, continued use of social supports, and brief walks. 3. Referral(s): Encouraged patient to contact [place] Counselor 4. "From scale of 1-10, how likely are you to follow plan?":  [place] Intern 04/26/19 2:18pm

## 2019-05-03 ENCOUNTER — Ambulatory Visit: Payer: Self-pay | Attending: Family Medicine

## 2019-05-03 ENCOUNTER — Other Ambulatory Visit: Payer: Self-pay

## 2019-05-08 ENCOUNTER — Telehealth: Payer: Self-pay | Admitting: Licensed Clinical Social Worker

## 2019-05-08 ENCOUNTER — Institutional Professional Consult (permissible substitution): Payer: Self-pay | Admitting: Licensed Clinical Social Worker

## 2019-05-08 NOTE — Telephone Encounter (Signed)
BHI placed call to pt. regarding missed apt today. Pt rescheduled tele for 05/10/19. Pt stated she was recently denied disability. BHI faxed a referral to Legal Aid per pt request.

## 2019-05-10 ENCOUNTER — Ambulatory Visit: Payer: Self-pay | Attending: Family Medicine | Admitting: Licensed Clinical Social Worker

## 2019-05-10 ENCOUNTER — Other Ambulatory Visit: Payer: Self-pay

## 2019-05-10 DIAGNOSIS — F419 Anxiety disorder, unspecified: Secondary | ICD-10-CM

## 2019-05-10 DIAGNOSIS — F331 Major depressive disorder, recurrent, moderate: Secondary | ICD-10-CM

## 2019-05-15 NOTE — BH Specialist Note (Signed)
Integrated Behavioral Health Visit via Telemedicine (Telephone)  05/15/2019 Philomena Course [id number]   Session Start time: 2:10pm  Session End time: 3:10pm Total time: 66  Referring Provider: Dr. Chapman Fitch Type of Visit: Telephonic Patient location: Home [facility] Provider location: [facility] All persons participating in visit: [place] Intern [facility] [place]) and patient  Confirmed patient's address: No  Confirmed patient's phone number: Yes  Any changes to demographics: No   Confirmed patient's insurance: No  Any changes to patient's insurance: Did not ask  Discussed confidentiality: Yes    The following statements were read to the patient and/or legal guardian that are established with the [facility] Provider.  "The purpose of this phone visit is to provide behavioral health care while limiting exposure to the coronavirus (COVID19).  There is a possibility of technology failure and discussed alternative modes of communication if that failure occurs."  "By engaging in this telephone visit, you consent to the provision of healthcare.  Additionally, you authorize for your insurance to be billed for the services provided during this telephone visit."   Patient and/or legal guardian consented to telephone visit: Yes   PRESENTING CONCERNS: Patient and/or family reports the following symptoms/concerns: Pt reports continued feelings of depression and anxiety triggered by financial stress and change in life circumstances.  Duration of problem: 6 months; Severity of problem: severe  STRENGTHS (Protective Factors/Coping Skills): Pt feels safe in home Pt is compliant with medication management Pt spends time with support network  GOALS ADDRESSED: Patient will: 1.  Reduce symptoms of: anxiety and depression  2.  Increase knowledge and/or ability of: coping skills  3.  Demonstrate ability to: Increase healthy adjustment to current life circumstances,  Increase adequate support systems for patient/family and Increase motivation to adhere to plan of care  INTERVENTIONS: Interventions utilized:  Motivational Interviewing, Behavioral Activation, Supportive Counseling and Medication Monitoring Standardized Assessments completed: GAD-7 and PHQ 9  ASSESSMENT: Patient currently experiencing continued symptoms of depression and anxiety as evidenced by low mood, feelings of guilt, and financial stress. Pt reports that she was first diagnosed with depression in 1996. Pt reports compliance with medication, Cymbalta. Pt reports Cymbalta has decreased tearfulness but PHQ9 and GAD7 scores remain high (19/27; 19/21). Pt reports financial stress about car payment that is two months late. Pt states she will likely lose her car soon. Pt was also recently denied disability. Pt continues to experience feelings of grief related to her change in life circumstances. Pt reports vague suicidal ideation. Pt denies a suicide plan or intent. Strategies were discussed to address ideations.   Patient may benefit from continued medication management, engagement with social support, and use of coping skills discussed in session. Pt is in contact with Legal Aid to assist her in appealing her recent disability denial. BHI encouraged pt to continue to work with Legal Aid.   PLAN: 1. Follow up with behavioral health clinician on : 05/18/19 2. Behavioral recommendations: BHI encouraged pt to meet with a friend, continued engagement with social supports, daily journaling using CBT techniques, and continued medication compliance.  3. Referral(s): [place] (In Clinic)  4. "From scale of 1-10, how likely are you to follow plan?": [place] Intern 05/15/2019 9:41am

## 2019-05-16 MED FILL — GABAPENTIN 600 MG TABLET: 600 | 30 days supply | Qty: 120 | Fill #2

## 2019-05-16 MED FILL — ?DULOXETINE HCL 60 MG CPEP: 60 | 30 days supply | Qty: 30 | Fill #2

## 2019-05-17 ENCOUNTER — Ambulatory Visit: Payer: Self-pay | Admitting: Licensed Clinical Social Worker

## 2019-05-18 ENCOUNTER — Other Ambulatory Visit: Payer: Self-pay

## 2019-05-18 ENCOUNTER — Ambulatory Visit: Payer: Self-pay | Attending: Family Medicine | Admitting: Licensed Clinical Social Worker

## 2019-05-18 DIAGNOSIS — F419 Anxiety disorder, unspecified: Secondary | ICD-10-CM

## 2019-05-18 DIAGNOSIS — F331 Major depressive disorder, recurrent, moderate: Secondary | ICD-10-CM

## 2019-05-18 NOTE — BH Specialist Note (Addendum)
Integrated Behavioral Health Visit via Telemedicine (Telephone)  05/18/2019 Philomena Course [id number]   Session Start time: 2:00pm  Session End time: 2:30pm Total time: 30  Referring Provider: Dr. Chapman Fitch Type of Visit: Telephonic Patient location: Home [facility] Provider location: Conference room at [facility] All persons participating in visit: Patient and [place] Intern  Confirmed patient's address: No  Confirmed patient's phone number: Yes  Any changes to demographics: No   Confirmed patient's insurance: No Any changes to patient's insurance: N/A  Discussed confidentiality: Yes    The following statements were read to the patient and/or legal guardian that are established with the [facility] Provider.  "The purpose of this phone visit is to provide behavioral health care while limiting exposure to the coronavirus (COVID19).  There is a possibility of technology failure and discussed alternative modes of communication if that failure occurs."  "By engaging in this telephone visit, you consent to the provision of healthcare.  Additionally, you authorize for your insurance to be billed for the services provided during this telephone visit."   Patient and/or legal guardian consented to telephone visit: Yes  PRESENTING CONCERNS: Patient and/or family reports the following symptoms/concerns: Pt reports continued feelings of depression. States that she feels the state is "more like a habit" and "[she] do[esn't] know how to let go of it". Pt does report a decrease in pain and attributes sx decrease to gabapentin and duloxetine medications.  Duration of problem: Ongoing; Severity of problem: severe  STRENGTHS (Protective Factors/Coping Skills): Pt completed homework from previous [place] meeting Pt is taking medications as directed Pt states therapy is helpful and is compliant to continued treatment  GOALS ADDRESSED: Patient will: 1.  Reduce symptoms of: anxiety, depression and  stress  2.  Increase knowledge and/or ability of: coping skills  3.  Demonstrate ability to: Increase healthy adjustment to current life circumstances and Increase adequate support systems for patient/family  INTERVENTIONS: Interventions utilized:  Behavioral Activation, Brief CBT, Supportive Counseling and Medication Monitoring Standardized Assessments completed: GAD-7 and PHQ 2&9  ASSESSMENT: Patient currently experiencing continued symptoms of depression and anxiety as evidenced by PHQ-9 and GAD-7 scores. PHQ9 = 21/27, GAD-7 = 14/21. Pt states she believes she is stuck in feelings of depression "like it's my normal" and she "doesn't know how to let go of it". Behavioral Health Intern provided brief CBT psycho-education to patient by explaining the correlation between one's thoughts, feelings, and behaviors. BHI assigned a journal homework assignment to pt to address pt's desire to change thoughts and behaviors associated with depressive sx. Pt did complete previous week's homework assignment by meeting with a friend and daily journaling.   Patient may benefit from continued medication management, behavioral activation homework, and brief CBT homework. Pt is interested in increasing her duloxetine dosage if Dr. Chapman Fitch believes it would be beneficial. BHI will send staff message to Dr. Chapman Fitch to consult regarding pt's medication increase request.   PLAN: 1. Follow up with behavioral health clinician on : 05/25/2019 2. Behavioral recommendations: Behavioral activation (meet with a friend and contact one friend a day), brief CBT (journal positive quotes, what pt has accomplished at the end of each day, journal about connection between thoughts and feelings).  3. Referral(s): [place] (In Clinic)  [place] Intern 05/18/2019  6:08pm

## 2019-05-19 ENCOUNTER — Other Ambulatory Visit: Payer: Self-pay | Admitting: Family Medicine

## 2019-05-19 DIAGNOSIS — E1142 Type 2 diabetes mellitus with diabetic polyneuropathy: Secondary | ICD-10-CM

## 2019-05-19 DIAGNOSIS — E1165 Type 2 diabetes mellitus with hyperglycemia: Secondary | ICD-10-CM

## 2019-05-19 MED ORDER — DULOXETINE HCL 60 MG PO CPEP: 60.0000 mg | ORAL_CAPSULE | Freq: Two times a day (BID) | ORAL | 1 refills | Status: DC

## 2019-05-19 NOTE — Progress Notes (Signed)
Patient ID: [name] [name], female   DOB: [birth year], 58 y.o.   MRN: [id number]   Message received from medical social work interm that patient would like an increase in her dose of duloxetine. Dose will be increased to help with diabetic peripheral neuropathy

## 2019-05-22 ENCOUNTER — Telehealth: Payer: Self-pay | Admitting: Licensed Clinical Social Worker

## 2019-05-22 NOTE — Telephone Encounter (Signed)
Behavioral Health Intern placed call to pt to inform her that she can increase her duloxetine per Dr. Siri Cole advise. Pt reports she began increasing her dose this past Thursday. BHI informed pt front desk will call to schedule follow up with Dr. Chapman Fitch.

## 2019-05-25 ENCOUNTER — Other Ambulatory Visit: Payer: Self-pay

## 2019-05-25 ENCOUNTER — Ambulatory Visit: Payer: Self-pay | Attending: Family Medicine | Admitting: Licensed Clinical Social Worker

## 2019-05-25 DIAGNOSIS — F331 Major depressive disorder, recurrent, moderate: Secondary | ICD-10-CM

## 2019-05-25 NOTE — BH Specialist Note (Signed)
Integrated Behavioral Health Visit via Telemedicine (Telephone)  05/25/2019 [name] [name] [id number]   Session Start time: 2:15pm  Session End time: 3:00pm Total time: 52   Referring Provider: Dr. Chapman Fitch Type of Visit: Telephonic Patient location: Home [facility] Provider location: Conference Room  All persons participating in visit: Pt and SW Intern  Confirmed patient's address: Yes  Confirmed patient's phone number: Yes  Any changes to demographics: No   Confirmed patient's insurance: Pt reports she was approved for orange card and blue card last week Any changes to patient's insurance: Orange card, blue card  Discussed confidentiality: Yes    The following statements were read to the patient and/or legal guardian that are established with the [facility] Provider.  "The purpose of this phone visit is to provide behavioral health care while limiting exposure to the coronavirus (COVID19).  There is a possibility of technology failure and discussed alternative modes of communication if that failure occurs."  "By engaging in this telephone visit, you consent to the provision of healthcare.  Additionally, you authorize for your insurance to be billed for the services provided during this telephone visit."   Patient and/or legal guardian consented to telephone visit: Yes   PRESENTING CONCERNS: Patient and/or family reports the following symptoms/concerns: Pt reports feelings of grief over her car being repossessed yesterday, some improvement in physical health, pt reports she cannot tell a difference in her medication increase. Pt continues to experience sx of depression but with increased verbalization of hope. Duration of problem: ongoing; Severity of problem: moderate  STRENGTHS (Protective Factors/Coping Skills): Pt is completing daily journal therapy assignments Pt communicates with friends via Social Media Pt views herself as resilient, stating "I have gotten through  worse"  GOALS ADDRESSED: Patient will: 1.  Reduce symptoms of: depression and stress  2.  Increase knowledge and/or ability of: coping skills and healthy habits  3.  Demonstrate ability to: Increase healthy adjustment to current life circumstances and Increase adequate support systems for patient/family  INTERVENTIONS: Interventions utilized:  Behavioral Activation, Brief CBT, Supportive Counseling and Medication Monitoring Standardized Assessments completed: Not Needed. Assessed 1 week ago.  ASSESSMENT: Patient currently experiencing continued feelings of depression triggered by recent experience of losing her home, and decline in physical and mental health. Pt reports her car was repossessed this past week. SW Intern provided emotional support to pt regarding loss. Pt reports she takes situations personally in social interactions. [place] Intern applied brief CBT intervention and provided psycho-education about the connection between thoughts, feelings, and behaviors. SW Intern encouraged pt to challenge her negative thoughts and continue to journal to increase feelings of wellbeing.   Pt reports Legal Aid is currently appealing disability. Pt reports she completed financial assistance application last week and was awarded the orange card and blue card.   Patient may benefit from continued brief therapy to address sx of depression and anxiety. Pt was encouraged to complete brief CBT journal assignments and participate in social interaction.  PLAN: 1. Follow up with behavioral health clinician on : 06/01/19 at 2pm 2. Behavioral recommendations: CBT exercises, social interaction, continue to take medication. 3. Referral(s): [place] (In Clinic)  [place] Work Intern 05/25/2019  3:14pm

## 2019-05-26 NOTE — Addendum Note (Signed)
Addended by: Christa See D on: 05/26/2019 01:40 PM   Modules accepted: Level of Service

## 2019-05-29 ENCOUNTER — Encounter (HOSPITAL_COMMUNITY): Payer: Self-pay | Admitting: Emergency Medicine

## 2019-05-29 ENCOUNTER — Other Ambulatory Visit: Payer: Self-pay

## 2019-05-29 ENCOUNTER — Emergency Department (HOSPITAL_COMMUNITY)
Admission: EM | Admit: 2019-05-29 | Discharge: 2019-05-29 | Disposition: A | Discharge status: Home / Self Care | Payer: Self-pay | Attending: Emergency Medicine | Admitting: Emergency Medicine

## 2019-05-29 ENCOUNTER — Emergency Department (HOSPITAL_COMMUNITY): Payer: Self-pay

## 2019-05-29 DIAGNOSIS — E1140 Type 2 diabetes mellitus with diabetic neuropathy, unspecified: Secondary | ICD-10-CM | POA: Insufficient documentation

## 2019-05-29 DIAGNOSIS — Z87891 Personal history of nicotine dependence: Secondary | ICD-10-CM | POA: Insufficient documentation

## 2019-05-29 DIAGNOSIS — I10 Essential (primary) hypertension: Secondary | ICD-10-CM | POA: Insufficient documentation

## 2019-05-29 DIAGNOSIS — R55 Syncope and collapse: Secondary | ICD-10-CM | POA: Insufficient documentation

## 2019-05-29 DIAGNOSIS — R519 Headache, unspecified: Secondary | ICD-10-CM | POA: Insufficient documentation

## 2019-05-29 DIAGNOSIS — Z794 Long term (current) use of insulin: Secondary | ICD-10-CM | POA: Insufficient documentation

## 2019-05-29 DIAGNOSIS — Z79899 Other long term (current) drug therapy: Secondary | ICD-10-CM | POA: Insufficient documentation

## 2019-05-29 LAB — COMPREHENSIVE METABOLIC PANEL
ALT: 28 U/L (ref 0–44)
AST: 23 U/L (ref 15–41)
Albumin: 3.9 g/dL (ref 3.5–5.0)
Alkaline Phosphatase: 113 U/L (ref 38–126)
Anion gap: 11 (ref 5–15)
BUN: 19 mg/dL (ref 6–20)
CO2: 23 mmol/L (ref 22–32)
Calcium: 9.3 mg/dL (ref 8.9–10.3)
Chloride: 95 mmol/L — ABNORMAL LOW (ref 98–111)
Creatinine, Ser: 0.63 mg/dL (ref 0.44–1.00)
GFR calc Af Amer: >60 mL/min (ref >60)
GFR calc non Af Amer: >60 mL/min (ref >60)
Glucose, Bld: 434 mg/dL — ABNORMAL HIGH (ref 70–99)
Potassium: 4.2 mmol/L (ref 3.5–5.1)
Sodium: 129 mmol/L — ABNORMAL LOW (ref 135–145)
Total Bilirubin: 0.9 mg/dL (ref 0.3–1.2)
Total Protein: 7.7 g/dL (ref 6.5–8.1)

## 2019-05-29 LAB — CBC WITH DIFFERENTIAL/PLATELET
Abs Immature Granulocytes: 0.02 10*3/uL (ref 0.00–0.07)
Basophils Absolute: 0 10*3/uL (ref 0.0–0.1)
Basophils Relative: 0 %
Eosinophils Absolute: 0.1 10*3/uL (ref 0.0–0.5)
Eosinophils Relative: 1 %
HCT: 41.3 % (ref 36.0–46.0)
Hemoglobin: 13.9 g/dL (ref 12.0–15.0)
Immature Granulocytes: 0 %
Lymphocytes Relative: 28 %
Lymphs Abs: 2 10*3/uL (ref 0.7–4.0)
MCH: 31 pg (ref 26.0–34.0)
MCHC: 33.7 g/dL (ref 30.0–36.0)
MCV: 92 fL (ref 80.0–100.0)
Monocytes Absolute: 0.6 10*3/uL (ref 0.1–1.0)
Monocytes Relative: 8 %
Neutro Abs: 4.3 10*3/uL (ref 1.7–7.7)
Neutrophils Relative %: 63 %
Platelets: 218 10*3/uL (ref 150–400)
RBC: 4.49 MIL/uL (ref 3.87–5.11)
RDW: 11.4 % — ABNORMAL LOW (ref 11.5–15.5)
WBC: 7 10*3/uL (ref 4.0–10.5)
nRBC: 0 % (ref 0.0–0.2)

## 2019-05-29 MED ORDER — NAPROXEN 500 MG PO TABS: 500.0000 mg | ORAL_TABLET | Freq: Two times a day (BID) | ORAL | 0 refills | Status: DC

## 2019-05-29 NOTE — Discharge Instructions (Signed)
Your CT scan was normal  Your blood work showed elevated blood sugars.  You will need to be completely compliant with your medications and your diet.  Please eat a low sugar diet and take your medications exactly as prescribed  You will need to drink plenty of fluids and rest for the next 48 hours.  See your doctor in 48 hours for recheck.  Emergency department for severe or worsening symptoms.

## 2019-05-29 NOTE — ED Provider Notes (Signed)
[facility] EMERGENCY DEPARTMENT Provider Note   CSN: [id number] Arrival date & time: 05/29/19  1429     History   Chief Complaint Chief Complaint  Patient presents with  . Fall    HPI [name] [name] is a 58 y.o. female.     HPI  This patient is a 58 year old female, she has a known history of a chronic pain syndrome, diabetes, acid reflux, neuropathy, she presents with a complaint of a headache which occurred at 2:00 in the morning when she was trying to walk from the bathroom back to the bed.  This caused severe pain caused her knees to buckle and she fell.  She did not pass out.  She was able to get back up and get in the bed.  Then again this morning or this afternoon while she was washing dishes just prior to arrival she felt the same pain, she felt like her head was weak, her knees gave out and again she collapsed but was able to get back up without falling.  At this time the headache is improved, it is persistent, it is now mild, she has no nausea vomiting or diarrhea, no chest pain coughing or shortness of breath, no changes in vision, no sore throat or congestion.  She has not had any headaches like this in the past, denies aneurysm, denies coagulopathy or anticoagulants.  Past Medical History:  Diagnosis Date  . Cervical disc disorder with radiculopathy of cervical region   . Chest pain   . Chronic low back pain    HNP  . Chronic pain syndrome   . Depression dx 1997  . Depression   . Diabetes mellitus, type 2 ([place]) 2011   No insulin  . Diabetic polyneuropathy ([place])   . GERD (gastroesophageal reflux disease)   . Glaucoma   . Headache(784.0)   . Herniated disc   . Hypertension    Lab 11/2011:  CXR, EKG, CBC, TSH, BMet, troponin-normal; lipid profile: 188, 131, 36, 126   . Non-compliance   . Pancreatic cyst    Endoscopic aspiration in 09/2009  . Pneumonia 08/02/2012  . Shingles   . Vertigo     Patient Active Problem List   Diagnosis Date Noted  . Chronic pain  syndrome 08/24/2017  . Diabetic polyneuropathy associated with type 2 diabetes mellitus ([place]) 08/24/2017  . Restless leg syndrome 08/24/2017  . Vertigo 04/09/2016  . Acute sinusitis 06/27/2015  . Cervical disc disorder with radiculopathy of cervical region 02/20/2015  . Neck muscle spasm 10/23/2014  . Healthcare maintenance 10/23/2014  . Depression 11/25/2013  . Diabetes type 2, uncontrolled ([place])     Past Surgical History:  Procedure Laterality Date  . ABDOMINAL HYSTERECTOMY    . CESAREAN SECTION       OB History   No obstetric history on file.      Home Medications    Prior to Admission medications   Medication Sig Start Date End Date Taking? Authorizing Provider  acetaminophen (TYLENOL) 325 MG tablet Take 2 tablets (650 mg total) by mouth every 6 (six) hours as needed. Patient not taking: Reported on 03/31/2019 08/18/17   Nuala Alpha, DO  amitriptyline (ELAVIL) 25 MG tablet TAKE 1 TABLET(25 MG) BY MOUTH AT BEDTIME Patient not taking: No sig reported 01/24/18   Nuala Alpha, DO  DULoxetine (CYMBALTA) 60 MG capsule Take 1 capsule (60 mg total) by mouth 2 (two) times daily. 05/19/19   Fulp, Cammie, MD  gabapentin (NEURONTIN) 600 MG tablet 1 pill  in the am, on mid-day/early evening and 2 at bedtime Patient taking differently: Take 600-1,200 mg by mouth See admin instructions. 1 pill in the am, on mid-day/early evening and 2 at bedtime 03/15/19   Fulp, Cammie, MD  glucose blood test strip [employer] Testing Strips Use as instructed 08/18/17   Lockamy, Timothy, DO  HUMALOG KWIKPEN 100 UNIT/ML KwikPen Inject 0.1 mLs (10 Units total) into the skin 3 (three) times daily. 03/15/19   Fulp, Cammie, MD  ibuprofen (ADVIL,MOTRIN) 200 MG tablet Take 800 mg by mouth daily as needed for mild pain.     [provider]  Insulin Glargine (LANTUS SOLOSTAR) 100 UNIT/ML Solostar Pen Inject 25 Units into the skin 2 (two) times daily. Increase dose by 1Unit each day your BG is over 150 in the  morning. 03/15/19   Fulp, Cammie, MD  Insulin Pen Needle (PEN NEEDLES) 31G X 8 MM MISC 1 Units by Does not apply route daily. 08/20/17   Nuala Alpha, DO  naproxen (NAPROSYN) 500 MG tablet Take 1 tablet (500 mg total) by mouth 2 (two) times daily with a meal. 05/29/19   Noemi Chapel, MD  ondansetron (ZOFRAN ODT) 4 MG disintegrating tablet Take 1 tablet (4 mg total) by mouth every 8 (eight) hours as needed for nausea or vomiting. Patient not taking: Reported on 04/12/2019 03/31/19   Betsey Amen, MD    Family History Family History  Problem Relation Age of Onset  . Depression Mother        type 1  . Diabetes Mother   . Renal Disease Mother   . Lung cancer Father     Social History Social History   Tobacco Use  . Smoking status: Former Smoker    Quit date: 08/03/1978    Years since quitting: 40.8  . Smokeless tobacco: Never Used  Substance Use Topics  . Alcohol use: No    Comment: Former  . Drug use: No     Allergies   Tramadol   Review of Systems Review of Systems  All other systems reviewed and are negative.    Physical Exam Updated Vital Signs BP 98/65 (BP Location: Right Arm)   Pulse (!) 109   Temp 98 F (36.7 C) (Oral)   Resp 16   Ht 1.676 m (5\' 6" )   Wt 60.6 kg   SpO2 98%   BMI 21.58 kg/m   Physical Exam Vitals signs and nursing note reviewed.  Constitutional:      General: She is not in acute distress.    Appearance: She is well-developed.  HENT:     Head: Normocephalic and atraumatic.     Comments: There is no tenderness over the temporal artery, normal opening and closing of the mouth, normal-appearing oropharynx and nasal passages    Mouth/Throat:     Pharynx: No oropharyngeal exudate.  Eyes:     General: No scleral icterus.       Right eye: No discharge.        Left eye: No discharge.     Conjunctiva/sclera: Conjunctivae normal.     Pupils: Pupils are equal, round, and reactive to light.  Neck:     Musculoskeletal: Normal range of  motion and neck supple.     Thyroid: No thyromegaly.     Vascular: No JVD.     Comments: Very supple neck with no lymphadenopathy Cardiovascular:     Rate and Rhythm: Normal rate and regular rhythm.     Heart sounds: Normal heart  sounds. No murmur. No friction rub. No gallop.   Pulmonary:     Effort: Pulmonary effort is normal. No respiratory distress.     Breath sounds: Normal breath sounds. No wheezing or rales.  Abdominal:     General: Bowel sounds are normal. There is no distension.     Palpations: Abdomen is soft. There is no mass.     Tenderness: There is no abdominal tenderness.  Musculoskeletal: Normal range of motion.        General: No tenderness.  Lymphadenopathy:     Cervical: No cervical adenopathy.  Skin:    General: Skin is warm and dry.     Findings: No erythema or rash.  Neurological:     Mental Status: She is alert.     Coordination: Coordination normal.     Comments: Speech is clear, cranial nerves III through XII are intact, memory is intact, strength is normal in all 4 extremities including grips, sensation is intact to light touch and pinprick in all 4 extremities. Coordination as tested by finger-nose-finger is normal, no limb ataxia. Normal gait, normal reflexes at the patellar tendons bilaterally  Psychiatric:        Behavior: Behavior normal.      ED Treatments / Results  Labs (all labs ordered are listed, but only abnormal results are displayed) Labs Reviewed  CBC WITH DIFFERENTIAL/PLATELET - Abnormal; Notable for the following components:      Result Value   RDW 11.4 (*)    All other components within normal limits  COMPREHENSIVE METABOLIC PANEL - Abnormal; Notable for the following components:   Sodium 129 (*)    Chloride 95 (*)    Glucose, Bld 434 (*)    All other components within normal limits    EKG None  Radiology Ct Head Wo Contrast  Result Date: 05/29/2019 CLINICAL DATA:  Headache EXAM: CT HEAD WITHOUT CONTRAST TECHNIQUE:  Contiguous axial images were obtained from the base of the skull through the vertex without intravenous contrast. COMPARISON:  12/04/2018 FINDINGS: Brain: No evidence of acute infarction, hemorrhage, hydrocephalus, extra-axial collection or mass lesion/mass effect. Mild cerebral volume loss, similar to prior. Vascular: No hyperdense vessel or unexpected calcification. Skull: Normal. Negative for fracture or focal lesion. Sinuses/Orbits: Chronic near complete opacification of the left maxillary sinus. Remaining visualized paranasal sinuses and mastoid air cells are clear. Orbital structures unremarkable. Other: None. IMPRESSION: 1. No acute intracranial abnormality. 2. Chronic left maxillary sinus disease. Electronically Signed   By: Davina Poke M.D.   On: 05/29/2019 15:23    Procedures Procedures (including critical care time)  Medications Ordered in ED Medications - No data to display   Initial Impression / Assessment and Plan / ED Course  I have reviewed the triage vital signs and the nursing notes.  Pertinent labs & imaging results that were available during my care of the patient were reviewed by me and considered in my medical decision making (see chart for details).       The patient's exam and level of alertness are totally normal.  She has normal musculoskeletal and neurologic function, her gait is normal, memory is normal, there is some risk that this could have been a sentinel bleed, will get the CT scan.  Patient agreeable  The patient CT scan is unremarkable, her laboratory work-up shows that she has normal blood counts, no leukocytosis or anemia, no renal dysfunction, her glucose is 430 consistent with her history of poor compliance with her medications and her sodium  is 129 which is a pseudohyponatremia and corrects.  The patient has no other acute findings, she does not have any signs of focal neurologic deficits, she will need to rest and drink lots of fluids for the next 24  hours, she will be given an anti-inflammatory for the headache.  She does not appear to have a subarachnoid hemorrhage and given that the most severe headache that she had happened just prior to arrival she was within 6 hours and unlikely to need a lumbar puncture for further diagnosis.  The patient will be updated on this information,  Patient agreeable to the plan  Final Clinical Impressions(s) / ED Diagnoses   Final diagnoses:  Bad headache    ED Discharge Orders         Ordered    naproxen (NAPROSYN) 500 MG tablet  2 times daily with meals     05/29/19 1639           Noemi Chapel, MD 05/29/19 1640

## 2019-05-29 NOTE — ED Triage Notes (Signed)
Pt states that she fell after getting a sharp pain in the back of her head and that happened again right before coming to the ER

## 2019-05-31 MED FILL — ?BASAGLAR 100 UNITS/ML KWPE: 100 | 12 days supply | Qty: 6 | Fill #1

## 2019-06-01 ENCOUNTER — Other Ambulatory Visit: Payer: Self-pay

## 2019-06-01 ENCOUNTER — Ambulatory Visit: Payer: Self-pay | Attending: Family Medicine | Admitting: Licensed Clinical Social Worker

## 2019-06-01 DIAGNOSIS — F331 Major depressive disorder, recurrent, moderate: Secondary | ICD-10-CM

## 2019-06-01 NOTE — BH Specialist Note (Signed)
Integrated Behavioral Health Visit via Telemedicine (Telephone)  06/01/2019 Philomena Course [id number]   Session Start time: 3:00pm  Session End time: 4:00pm Total time: 53  Referring Provider: Dr. Chapman Fitch Type of Visit: Telephonic Patient location: Home [facility] Provider location: [facility] Conference Room All persons participating in visit: Pt and MSW Intern  Confirmed patient's address: Yes  Confirmed patient's phone number: Yes  Any changes to demographics: No   Confirmed patient's insurance: Yes  Any changes to patient's insurance: No   Discussed confidentiality: Yes    The following statements were read to the patient and/or legal guardian that are established with the [facility] Provider.  "The purpose of this phone visit is to provide behavioral health care while limiting exposure to the coronavirus (COVID19).  There is a possibility of technology failure and discussed alternative modes of communication if that failure occurs."  "By engaging in this telephone visit, you consent to the provision of healthcare.  Additionally, you authorize for your insurance to be billed for the services provided during this telephone visit."   Patient and/or legal guardian consented to telephone visit: Yes   PRESENTING CONCERNS: Patient and/or family reports the following symptoms/concerns: Pt reports feelings of depression triggered by recent episode of high blood sugar and psychosocial stressors Duration of problem: Ongoing; Severity of problem: moderate  STRENGTHS (Protective Factors/Coping Skills): Pt is participating in medication management Pt is participating in brief therapy Pt has support system with family and friends  GOALS ADDRESSED: Patient will: 1.  Reduce symptoms of: anxiety, depression and stress  2.  Increase knowledge and/or ability of: coping skills, healthy habits and self-management skills  3.  Demonstrate ability to: Increase healthy adjustment to current life  circumstances and Increase adequate support systems for patient/family  INTERVENTIONS: Interventions utilized:  Behavioral Activation, Brief CBT, Supportive Counseling and Medication Monitoring Standardized Assessments completed: No screening completed during this appointment  ASSESSMENT: Patient currently experiencing feelings of depression and anxiety triggered by psychosocial stressors. Pt reports difficulty communicating with family members while living with her daughter, son-in law, and grandchildren. Pt talked with her ex-boyfriend for support this week. MSW Intern provided emotional support and explored ways pt can communicate effectively with her family.   Pt set the goals of standing up for herself, knowing she is worthy, and bringing her blood sugar down. MSW Intern and pt discussed specific ways pt will address these goals over the next week. MSW Intern provided pt with brief CBT worksheets. Patient was also encouraged to identify unhelpful thinking patterns, challenge thoughts, and continue to participate in behavioral activation with pleasant activities worksheet.   Patient may benefit from continued brief therapy, continued medication management, completion of homework assignments discussed in session.  PLAN: 1. Follow up with behavioral health clinician on : 06/08/2019 2. Behavioral recommendations: MSW Intern encouraged pt to engage in behavioral activation, complete brief CBT worksheets, and engage with social supports. 3. Referral(s): [place] (In Clinic)  Berniece Salines MSW Intern 06/01/2019  3:46pm

## 2019-06-05 ENCOUNTER — Ambulatory Visit: Payer: Medicaid Other | Admitting: Licensed Clinical Social Worker

## 2019-06-08 ENCOUNTER — Ambulatory Visit: Payer: Medicaid Other | Admitting: Licensed Clinical Social Worker

## 2019-06-08 ENCOUNTER — Ambulatory Visit: Payer: Medicaid Other | Admitting: Family Medicine

## 2019-06-08 MED FILL — ?DULOXETINE HCL 60 MG CPEP: 60 | 30 days supply | Qty: 60 | Fill #0

## 2019-06-08 NOTE — Progress Notes (Deleted)
Integrated Behavioral Health Visit via Telemedicine (Telephone)  06/08/2019 Philomena Course [id number]   Session Start time: 04:05pm  Session End time: 4:30 Total time: 25 minutes  Referring Provider: Dr. Chapman Fitch Type of Visit: Telephonic Patient location: Home [facility] Provider location: [facility] Conference Room All persons participating in visit: Pt, MSW Intern  Confirmed patient's address: No  Confirmed patient's phone number: Yes  Any changes to demographics: No   Confirmed patient's insurance: No  Any changes to patient's insurance: No   Discussed confidentiality: Yes    The following statements were read to the patient and/or legal guardian that are established with the [facility] Provider.  "The purpose of this phone visit is to provide behavioral health care while limiting exposure to the coronavirus (COVID19).  There is a possibility of technology failure and discussed alternative modes of communication if that failure occurs."  "By engaging in this telephone visit, you consent to the provision of healthcare.  Additionally, you authorize for your insurance to be billed for the services provided during this telephone visit."   Patient and/or legal guardian consented to telephone visit: Yes   PRESENTING CONCERNS: Patient and/or family reports the following symptoms/concerns: Ongoing feelings of depression Duration of problem: ongoing; Severity of problem: moderate  STRENGTHS (Protective Factors/Coping Skills): Pt identified enjoyable activities to practice Pt spent time with a friend this weekend Pt is compliant with medication  GOALS ADDRESSED: Patient will: 1.  Reduce symptoms of: anxiety and depression  2.  Increase knowledge and/or ability of: coping skills and healthy habits  3.  Demonstrate ability to: Increase healthy adjustment to current life circumstances and Increase adequate support systems for patient/family  INTERVENTIONS: Interventions utilized:   Behavioral Activation, Brief CBT, Supportive Counseling and Medication Monitoring Standardized Assessments completed: Not Needed  ASSESSMENT: Patient currently experiencing ongoing feelings of depression. Pt stated today is a particularly difficult day, endorsing low mood and low energy. Pt reports feeling like "nothing ever changes, nothing ever happens". When MSW Intern asked pt if she feels like her medication (duloxetine) is helping, pt reports it is and she declined interest in trying a different medication to address symptoms. Pt reports she missed her scheduled appointment with PCP due to confusion about it being in person rather than tele-health.  Patient may benefit from continued medication management, brief therapy to challenge negative thinking, feeling, and behavior. MSW Intern will contact Legal Aid to inquire about pt's recent disability referral.   PLAN: 1. Follow up with behavioral health clinician on : 06/22/2019 2. Behavioral recommendations: Participate in pleasant activities discussed from behavioral activation worksheet, challenge unhealthy thinking with journaling and mindfulness exercises discussed in session. MSW Intern encouraged pt to reschedule an appointment with her PCP. 3. Referral(s): [place] (In Clinic)  [place] MSW Intern  06/08/2019

## 2019-06-22 ENCOUNTER — Ambulatory Visit: Payer: Self-pay | Attending: Family Medicine | Admitting: Licensed Clinical Social Worker

## 2019-06-22 ENCOUNTER — Other Ambulatory Visit: Payer: Self-pay

## 2019-06-22 DIAGNOSIS — F331 Major depressive disorder, recurrent, moderate: Secondary | ICD-10-CM

## 2019-06-22 NOTE — BH Specialist Note (Addendum)
Integrated Behavioral Health Visit via Telemedicine (Telephone)  06/22/2019 Philomena Course [id number]   Session Start time: 2:05pm  Session End time: 2:50pm Total time: 22   Referring Provider: Dr. Chapman Fitch Type of Visit: Telephonic Patient location: Home [facility] Provider location: [facility] Provider Office All persons participating in visit: MSW Intern and Patient  Confirmed patient's address: Yes  Confirmed patient's phone number: Yes  Any changes to demographics: No   Confirmed patient's insurance: No  Any changes to patient's insurance: No   Discussed confidentiality: Yes    The following statements were read to the patient and/or legal guardian that are established with the [facility] Provider.  "The purpose of this phone visit is to provide behavioral health care while limiting exposure to the coronavirus (COVID19).  There is a possibility of technology failure and discussed alternative modes of communication if that failure occurs."  "By engaging in this telephone visit, you consent to the provision of healthcare.  Additionally, you authorize for your insurance to be billed for the services provided during this telephone visit."   Patient and/or legal guardian consented to telephone visit: Yes   PRESENTING CONCERNS: Patient and/or family reports the following symptoms/concerns: Ongoing symptoms of depression. Patient reports her mood symptoms are triggered by health conditions and financial difficulties.  Duration of problem: Ongoing; Severity of problem: moderate  STRENGTHS (Protective Factors/Coping Skills): Pt is motivated Pt has insight into how her perspective has improved over past 2 months Pt has social and family support Pt is compliant with medication and open to continued psychotherapy  GOALS ADDRESSED: Patient will: 1.  Reduce symptoms of: depression and stress  2.  Increase knowledge and/or ability of: coping skills and self-management skills  3.  Demonstrate  ability to: Increase healthy adjustment to current life circumstances and Increase adequate support systems for patient/family  INTERVENTIONS: Interventions utilized:  Motivational Interviewing, Behavioral Activation, Brief CBT, Supportive Counseling, Medication Monitoring and Link to [employer] Standardized Assessments completed: GAD-7 and PHQ 2&9  ASSESSMENT: Patient currently experiencing symptoms of depression as evidenced by positive PHQ-9 scores. Pt has reduced GAD-7 scores since last reading on 05/18/19. Patient reports an improvement in mood despite similar PHQ-9 scores over the past two months marked by decreased rumination, behavioral activation practices, and increased optimism. Pt reports she is utilizing coping skills discussed in session and using social support. Pt reports her positive scores on PHQ-9 and GAD-7 are also due to neuropathy symptoms. Pt reports she contacted Legal Aid and was advised to reach out again in mid-December to follow up regarding her disability appeal. Pt reports interest in vocational rehabilitation services and ongoing psychotherapy services.  Patient may benefit from scheduling an appointment with PCP to request a vocational rehab referral, psychotherapy services with [facility] (using [facility]), and continued use of coping skills discussed in session.   PLAN: 1. Follow up with behavioral health clinician on : 07/05/2019 2. Behavioral recommendations: MSW Intern encouraged patient to continue to practice coping skills discussed in session, secure an appointment with PCP, request a referral to vocational rehabilitation, and [facility] Outpatient. 3. Referral(s): [place] (In Clinic) and [place] (LME/Outside Clinic)  Berniece Salines  MSW Intern  06/22/2019  3:26pm

## 2019-06-27 MED FILL — GABAPENTIN 600 MG TABLET: 600 | 30 days supply | Qty: 120 | Fill #3

## 2019-06-28 ENCOUNTER — Encounter: Payer: Self-pay | Admitting: Family Medicine

## 2019-06-28 ENCOUNTER — Ambulatory Visit: Payer: Self-pay | Attending: Family Medicine | Admitting: Family Medicine

## 2019-06-28 ENCOUNTER — Other Ambulatory Visit: Payer: Self-pay

## 2019-06-28 VITALS — BP 87/55 | HR 120 | Temp 98.2°F | Resp 18 | Ht 66.0 in | Wt 134.0 lb

## 2019-06-28 DIAGNOSIS — Z91199 Patient's other noncompliance with medication regimen for other reason: Secondary | ICD-10-CM

## 2019-06-28 DIAGNOSIS — Z9114 Patient's other noncompliance with medication regimen: Secondary | ICD-10-CM

## 2019-06-28 DIAGNOSIS — E1142 Type 2 diabetes mellitus with diabetic polyneuropathy: Secondary | ICD-10-CM

## 2019-06-28 DIAGNOSIS — R000 Tachycardia, unspecified: Secondary | ICD-10-CM

## 2019-06-28 DIAGNOSIS — Z91148 Patient's other noncompliance with medication regimen for other reason: Secondary | ICD-10-CM

## 2019-06-28 DIAGNOSIS — Z9111 Patient's noncompliance with dietary regimen: Secondary | ICD-10-CM

## 2019-06-28 DIAGNOSIS — E1165 Type 2 diabetes mellitus with hyperglycemia: Secondary | ICD-10-CM

## 2019-06-28 DIAGNOSIS — F3341 Major depressive disorder, recurrent, in partial remission: Secondary | ICD-10-CM

## 2019-06-28 DIAGNOSIS — I959 Hypotension, unspecified: Secondary | ICD-10-CM

## 2019-06-28 DIAGNOSIS — R42 Dizziness and giddiness: Secondary | ICD-10-CM

## 2019-06-28 DIAGNOSIS — K089 Disorder of teeth and supporting structures, unspecified: Secondary | ICD-10-CM

## 2019-06-28 LAB — GLUCOSE, POCT (MANUAL RESULT ENTRY): POC Glucose: 463 mg/dL — AB (ref 70–99)

## 2019-06-28 LAB — POCT GLYCOSYLATED HEMOGLOBIN (HGB A1C): Hemoglobin A1C: 13.1 % — AB (ref 4.0–5.6)

## 2019-06-28 MED ORDER — DULOXETINE HCL 60 MG PO CPEP: 60.0000 mg | ORAL_CAPSULE | Freq: Two times a day (BID) | ORAL | 4 refills | Status: DC

## 2019-06-28 MED FILL — ?DULOXETINE HCL 60 MG CPEP: 60 | 30 days supply | Qty: 60 | Fill #0

## 2019-06-28 NOTE — Progress Notes (Signed)
Established Patient Office Visit  Subjective:  Patient ID: [name] [name], female    DOB: 28-Apr-1961  Age: 58 y.o. MRN: [id number]  CC:  Chief Complaint  Patient presents with  . Follow-up    HPI [name] [name], 58 yo diabetic female who is seen in follow-up of visit from 04/12/2019 at which time patient with uncontrolled diabetes,, neuropathic pain depression and history of non-compliance with medication.  She reports that she has been compliant with her medications.  She did forget to take insulin prior to today's visit.  She continues to have issues with chronic pain related to her diabetic peripheral neuropathy.  She believes that increasing the dose of Cymbalta has helped with both pain related to peripheral neuropathy as well as her depression.        She does report issues that have been occurring for some time with low blood pressure as well as dizziness when changing from sitting to standing position and increased heart rate that occurs along with the dizziness.-She has had no prior evaluation for these issues.  So far she denies any issues with chest pain or syncopal episodes.  She has felt as if she would fall and has had to grab onto nearby objects or place her hand on the wall in order to keep from falling.       She reports that she has had issues with poor dentition wears prior tooth loss.  She would like to be referred to a dentist for further evaluation and treatment.   Past Medical History:  Diagnosis Date  . Cervical disc disorder with radiculopathy of cervical region   . Chest pain   . Chronic low back pain    HNP  . Chronic pain syndrome   . Depression dx 1997  . Depression   . Diabetes mellitus, type 2 ([place]) 2011   No insulin  . Diabetic polyneuropathy ([place])   . GERD (gastroesophageal reflux disease)   . Glaucoma   . Headache(784.0)   . Herniated disc   . Hypertension    Lab 11/2011:  CXR, EKG, CBC, TSH, BMet, troponin-normal; lipid profile: 188, 131, 36, 126   .  Non-compliance   . Pancreatic cyst    Endoscopic aspiration in 09/2009  . Pneumonia 08/02/2012  . Shingles   . Vertigo     Past Surgical History:  Procedure Laterality Date  . ABDOMINAL HYSTERECTOMY    . CESAREAN SECTION      Family History  Problem Relation Age of Onset  . Depression Mother        type 1  . Diabetes Mother   . Renal Disease Mother   . Lung cancer Father     Social History   Socioeconomic History  . Marital status: Single    Spouse name: Not on file  . Number of children: Not on file  . Years of education: Not on file  . Highest education level: Not on file  Occupational History  . Not on file  Social Needs  . Financial resource strain: Not on file  . Food insecurity    Worry: Not on file    Inability: Not on file  . Transportation needs    Medical: Not on file    Non-medical: Not on file  Tobacco Use  . Smoking status: Former Smoker    Quit date: 08/03/1978    Years since quitting: 40.9  . Smokeless tobacco: Never Used  Substance and Sexual Activity  . Alcohol use: No  Comment: Former  . Drug use: No  . Sexual activity: Yes    Birth control/protection: Surgical  Lifestyle  . Physical activity    Days per week: Not on file    Minutes per session: Not on file  . Stress: Not on file  Relationships  . Social Herbalist on phone: Not on file    Gets together: Not on file    Attends religious service: Not on file    Active member of club or organization: Not on file    Attends meetings of clubs or organizations: Not on file    Relationship status: Not on file  . Intimate partner violence    Fear of current or ex partner: Not on file    Emotionally abused: Not on file    Physically abused: Not on file    Forced sexual activity: Not on file  Other Topics Concern  . 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     Outpatient Medications Prior to Visit  Medication Sig Dispense Refill   . acetaminophen (TYLENOL) 325 MG tablet Take 2 tablets (650 mg total) by mouth every 6 (six) hours as needed. 60 tablet 2  . DULoxetine (CYMBALTA) 60 MG capsule Take 1 capsule (60 mg total) by mouth 2 (two) times daily. 60 capsule 1  . gabapentin (NEURONTIN) 600 MG tablet 1 pill in the am, on mid-day/early evening and 2 at bedtime (Patient taking differently: Take 600-1,200 mg by mouth See admin instructions. 1 pill in the am, on mid-day/early evening and 2 at bedtime) 120 tablet 3  . glucose blood test strip Relion Testing Strips Use as instructed 100 each 12  . HUMALOG KWIKPEN 100 UNIT/ML KwikPen Inject 0.1 mLs (10 Units total) into the skin 3 (three) times daily. 15 mL prn  . ibuprofen (ADVIL,MOTRIN) 200 MG tablet Take 800 mg by mouth daily as needed for mild pain.     . Insulin Glargine (LANTUS SOLOSTAR) 100 UNIT/ML Solostar Pen Inject 25 Units into the skin 2 (two) times daily. Increase dose by 1Unit each day your BG is over 150 in the morning. 5 pen prn  . Insulin Pen Needle (PEN NEEDLES) 31G X 8 MM MISC 1 Units by Does not apply route daily. 100 each 2  . amitriptyline (ELAVIL) 25 MG tablet TAKE 1 TABLET(25 MG) BY MOUTH AT BEDTIME (Patient not taking: No sig reported) 30 tablet 0  . naproxen (NAPROSYN) 500 MG tablet Take 1 tablet (500 mg total) by mouth 2 (two) times daily with a meal. 30 tablet 0  . ondansetron (ZOFRAN ODT) 4 MG disintegrating tablet Take 1 tablet (4 mg total) by mouth every 8 (eight) hours as needed for nausea or vomiting. (Patient not taking: Reported on 04/12/2019) 10 tablet 0   No facility-administered medications prior to visit.     Allergies  Allergen Reactions  . Tramadol Nausea And Vomiting    REACTION: Projectile vomiting    ROS Review of Systems  Constitutional: Positive for fatigue. Negative for chills and fever.  HENT: Negative for sore throat and trouble swallowing.   Eyes: Positive for visual disturbance. Negative for photophobia.  Respiratory:  Negative for cough and shortness of breath.   Cardiovascular: Negative for chest pain, palpitations and leg swelling.  Gastrointestinal: Negative for abdominal pain, blood in stool, constipation and diarrhea.  Endocrine: Positive for polydipsia and polyuria. Negative for polyphagia.  Genitourinary: Positive for  frequency. Negative for dysuria.  Musculoskeletal: Positive for back pain, gait problem (Diabetic neuropathy/foot pain) and neck pain (Chronic). Negative for arthralgias.  Neurological: Positive for dizziness, light-headedness and numbness. Negative for headaches.  Hematological: Negative for adenopathy. Does not bruise/bleed easily.  Psychiatric/Behavioral: Negative for self-injury and suicidal ideas. The patient is nervous/anxious.       Objective:    Physical Exam  Constitutional: She is oriented to person, place, and time. She appears well-developed and well-nourished. No distress.  Thin, smaller framed older female in no acute distress but patient appears slightly fatigued.  Neck: No JVD present. No thyromegaly present.  Cardiovascular: Regular rhythm.  Mild tachycardia  Pulmonary/Chest: Effort normal.  Abdominal: Soft. There is no abdominal tenderness. There is no rebound and no guarding.  Musculoskeletal:        General: Tenderness present. No edema.     Cervical back: Normal range of motion and neck supple.     Comments: Lumbosacral tenderness to palpation.  No CVA tenderness  Lymphadenopathy:    She has no cervical adenopathy.  Neurological: She is alert and oriented to person, place, and time.  Skin: Skin is warm and dry. She is not diaphoretic.  Psychiatric: Her behavior is normal.  Slightly flattened affect but improved from prior visits.  Patient interacts appropriately  Nursing note and vitals reviewed.   BP (!) 87/55 (BP Location: Left Arm, Patient Position: Sitting, Cuff Size: Normal)   Pulse (!) 120   Temp 98.2 F (36.8 C) (Oral)   Resp 18   Ht 5\' 6"   (1.676 m)   Wt 134 lb (60.8 kg)   SpO2 99%   BMI 21.63 kg/m  Wt Readings from Last 3 Encounters:  06/28/19 134 lb (60.8 kg)  05/29/19 133 lb 11.2 oz (60.6 kg)  03/31/19 139 lb (63 kg)     Health Maintenance Due  Topic Date Due  . OPHTHALMOLOGY EXAM  12/31/1969  . TETANUS/TDAP  01/01/1979  . MAMMOGRAM  12/31/2009  . COLONOSCOPY  12/31/2009  . FOOT EXAM  03/23/2015  . PAP SMEAR-Modifier  11/25/2016  . URINE MICROALBUMIN  08/18/2018  . INFLUENZA VACCINE  02/18/2019     Lab Results  Component Value Date   TSH 2.510 03/15/2019   Lab Results  Component Value Date   WBC 7.0 05/29/2019   HGB 13.9 05/29/2019   HCT 41.3 05/29/2019   MCV 92.0 05/29/2019   PLT 218 05/29/2019   Lab Results  Component Value Date   NA 129 (L) 05/29/2019   K 4.2 05/29/2019   CO2 23 05/29/2019   GLUCOSE 434 (H) 05/29/2019   BUN 19 05/29/2019   CREATININE 0.63 05/29/2019   BILITOT 0.9 05/29/2019   ALKPHOS 113 05/29/2019   AST 23 05/29/2019   ALT 28 05/29/2019   PROT 7.7 05/29/2019   ALBUMIN 3.9 05/29/2019   CALCIUM 9.3 05/29/2019   ANIONGAP 11 05/29/2019   Lab Results  Component Value Date   CHOL 197 03/15/2019   Lab Results  Component Value Date   HDL 36 (L) 03/15/2019   Lab Results  Component Value Date   LDLCALC 109 (H) 03/15/2019   Lab Results  Component Value Date   TRIG 260 (H) 03/15/2019   Lab Results  Component Value Date   CHOLHDL 5.5 (H) 03/15/2019   Lab Results  Component Value Date   HGBA1C 13.1 (A) 06/28/2019      Assessment & Plan:  1. Uncontrolled type 2 diabetes mellitus with hyperglycemia ([place]); 8. noncompliance  with medication and diet regimen Patient with blood sugar at today's visit of 463 and hemoglobin A1c of 13.1.  She has not taken her insulin today and patient is again reminded of the importance of compliance with insulin therapy to help lower her blood sugars.  She agrees that she will take her insulin as soon as she arrives home and recheck  her blood sugar in about 30 minutes after taking her medications.  She will have comprehensive metabolic panel at today's visit.  New prescription provided for duloxetine as she states that this has helped with both her depression and anxiety as well as helping with pain related to diabetic neuropathy and chronic issues with back pain.  Patient has been asked to return to follow-up with the clinical pharmacist and bring her glucometer to review her diabetic regimen to help with control of blood sugars but patient with long history of noncompliance with medications/diabetic treatment plan. - HgB A1c - Glucose (CBG) - Comprehensive metabolic panel - DULoxetine (CYMBALTA) 60 MG capsule; Take 1 capsule (60 mg total) by mouth 2 (two) times daily.  Dispense: 60 capsule; Refill: 4 - Amb Referral to Clinical Pharmacist - Ambulatory referral to Neurology - Ambulatory referral to Social Work - Ambulatory referral to Dentistry  2. Hypotension, unspecified hypotension type 3. Dizziness Patient with issues with hypotension and recurrent dizziness.  I discussed with the patient that I believe that she may have some issues with autonomic dysfunction related to her history of uncontrolled diabetes.  Patient also likely has issues with dehydration due to urinary frequency related to her uncontrolled type 2 diabetes.  Discussed with the patient that she needs to remain well-hydrated with water, increase her compliance with her medications for control of diabetes to help prevent urinary frequency and dehydration, she may also wish to use compression hose and make sure that she make slow transitions between sitting and standing and after standing, wait for 10 or more seconds before initiating movement to help prevent dizziness/falls.  She is also being referred to cardiology and neurology for further evaluation. - Ambulatory referral to Cardiology - Ambulatory referral to Neurology  4. Diabetic peripheral neuropathy  [facility]) Patient with complaint of continued diabetic peripheral neuropathy.  Refill provided of duloxetine as she feels that this has helped.  She will be referred to neurology for further evaluation and treatment. - DULoxetine (CYMBALTA) 60 MG capsule; Take 1 capsule (60 mg total) by mouth 2 (two) times daily.  Dispense: 60 capsule; Refill: 4 - Ambulatory referral to Neurology  5. Recurrent major depressive disorder, in partial remission [facility]) Patient with recurrent major depressive disorder which has been partial remission.  She has been referred to social work for continued follow-up and counseling and discussed patient establishing long-term mental health follow-up. - Ambulatory referral to Social Work  6. Tachycardia Will check T4 and TSH in follow-up of patient's tachycardia.  Suspect that this is also partially related to dehydration.  She will also be referred to cardiology in follow-up of hypertension, tachycardia and dizziness. - T4 AND TSH  7. Poor dentition Referral placed to dentistry in follow-up of patient's issues with poor dentition and patient is encouraged to be compliant with diabetes medications, diet and monitoring to help control her blood sugars as her uncontrolled diabetes can also contribute to her dental issues. - Ambulatory referral to Dentistry   An After Visit Summary was printed and given to the patient.   Follow-up: .Return in about 3 months (around 09/26/2019) for chronic issues;  Luke in 2-3 weeks.   Antony Blackbird, MD

## 2019-06-28 NOTE — Patient Instructions (Signed)
Hypotension °As your heart beats, it forces blood through your body. This force is called blood pressure. If you have hypotension, you have low blood pressure. When your blood pressure is too low, you may not get enough blood to your brain or other parts of your body. This may cause you to feel weak, light-headed, have a fast heartbeat, or even pass out (faint). Low blood pressure may be harmless, or it may cause serious problems. °What are the causes? °· Blood loss. °· Not enough water in the body (dehydration). °· Heart problems. °· Hormone problems. °· Pregnancy. °· A very bad infection. °· Not having enough of certain nutrients. °· Very bad allergic reactions. °· Certain medicines. °What increases the risk? °· Age. The risk increases as you get older. °· Conditions that affect the heart or the brain and spinal cord (central nervous system). °· Taking certain medicines. °· Being pregnant. °What are the signs or symptoms? °· Feeling: °? Weak. °? Light-headed. °? Dizzy. °? Tired (fatigued). °· Blurred vision. °· Fast heartbeat. °· Passing out, in very bad cases. °How is this treated? °· Changing your diet. This may involve eating more salt (sodium) or drinking more water. °· Taking medicines to raise your blood pressure. °· Changing how much you take (the dosage) of some of your medicines. °· Wearing compression stockings. These stockings help to prevent blood clots and reduce swelling in your legs. °In some cases, you may need to go to the hospital for: °· Fluid replacement. This means you will receive fluids through an IV tube. °· Blood replacement. This means you will receive donated blood through an IV tube (transfusion). °· Treating an infection or heart problems, if this applies. °· Monitoring. You may need to be monitored while medicines that you are taking wear off. °Follow these instructions at home: °Eating and drinking ° °· Drink enough fluids to keep your pee (urine) pale yellow. °· Eat a healthy diet.  Follow instructions from your doctor about what you can eat or drink. A healthy diet includes: °? Fresh fruits and vegetables. °? Whole grains. °? Low-fat (lean) meats. °? Low-fat dairy products. °· Eat extra salt only as told. Do not add extra salt to your diet unless your doctor tells you to. °· Eat small meals often. °· Avoid standing up quickly after you eat. °Medicines °· Take over-the-counter and prescription medicines only as told by your doctor. °? Follow instructions from your doctor about changing how much you take of your medicines, if this applies. °? Do not stop or change any of your medicines on your own. °General instructions ° °· Wear compression stockings as told by your doctor. °· Get up slowly from lying down or sitting. °· Avoid hot showers and a lot of heat as told by your doctor. °· Return to your normal activities as told by your doctor. Ask what activities are safe for you. °· Do not use any products that contain nicotine or tobacco, such as cigarettes, e-cigarettes, and chewing tobacco. If you need help quitting, ask your doctor. °· Keep all follow-up visits as told by your doctor. This is important. °Contact a doctor if: °· You throw up (vomit). °· You have watery poop (diarrhea). °· You have a fever for more than 2-3 days. °· You feel more thirsty than normal. °· You feel weak and tired. °Get help right away if: °· You have chest pain. °· You have a fast or uneven heartbeat. °· You lose feeling (have numbness) in any   part of your body. °· You cannot move your arms or your legs. °· You have trouble talking. °· You get sweaty or feel light-headed. °· You pass out. °· You have trouble breathing. °· You have trouble staying awake. °· You feel mixed up (confused). °Summary °· Hypotension is also called low blood pressure. It is when the force of blood pumping through your arteries is too weak. °· Hypotension may be harmless, or it may cause serious problems. °· Treatment may include changing  your diet and medicines, and wearing compression stockings. °· In very bad cases, you may need to go to the hospital. °This information is not intended to replace advice given to you by your health care provider. Make sure you discuss any questions you have with your health care provider. °Document Released: 09/30/2009 Document Revised: 12/30/2017 Document Reviewed: 12/30/2017 °Elsevier Patient Education © 2020 [employer] Inc[employer] ° °

## 2019-06-29 ENCOUNTER — Ambulatory Visit (INDEPENDENT_AMBULATORY_CARE_PROVIDER_SITE_OTHER): Payer: No Typology Code available for payment source | Admitting: Cardiovascular Disease

## 2019-06-29 ENCOUNTER — Encounter: Payer: Self-pay | Admitting: Cardiovascular Disease

## 2019-06-29 DIAGNOSIS — G903 Multi-system degeneration of the autonomic nervous system: Secondary | ICD-10-CM

## 2019-06-29 DIAGNOSIS — I959 Hypotension, unspecified: Secondary | ICD-10-CM | POA: Insufficient documentation

## 2019-06-29 LAB — COMPREHENSIVE METABOLIC PANEL WITH GFR
ALT: 26 IU/L (ref 0–32)
AST: 21 IU/L (ref 0–40)
Albumin/Globulin Ratio: 1.3 (ref 1.2–2.2)
Albumin: 3.9 g/dL (ref 3.8–4.9)
Alkaline Phosphatase: 138 IU/L — ABNORMAL HIGH (ref 39–117)
BUN/Creatinine Ratio: 22 (ref 9–23)
BUN: 17 mg/dL (ref 6–24)
Bilirubin Total: 0.3 mg/dL (ref 0.0–1.2)
CO2: 24 mmol/L (ref 20–29)
Calcium: 9.8 mg/dL (ref 8.7–10.2)
Chloride: 99 mmol/L (ref 96–106)
Creatinine, Ser: 0.79 mg/dL (ref 0.57–1.00)
GFR calc Af Amer: 95 mL/min/1.73
GFR calc non Af Amer: 82 mL/min/1.73
Globulin, Total: 3 g/dL (ref 1.5–4.5)
Glucose: 386 mg/dL — ABNORMAL HIGH (ref 65–99)
Potassium: 4.5 mmol/L (ref 3.5–5.2)
Sodium: 136 mmol/L (ref 134–144)
Total Protein: 6.9 g/dL (ref 6.0–8.5)

## 2019-06-29 LAB — T4 AND TSH
T4, Total: 7.3 ug/dL (ref 4.5–12.0)
TSH: 2.21 u[IU]/mL (ref 0.450–4.500)

## 2019-06-29 MED ORDER — FLUDROCORTISONE ACETATE 0.1 MG PO TABS: 0.1000 mg | ORAL_TABLET | Freq: Every day | ORAL | 2 refills | Status: DC

## 2019-06-29 MED FILL — FLUDROCORTISONE 0.1 MG TAB: 0.1 | 30 days supply | Qty: 30 | Fill #0

## 2019-06-29 NOTE — Progress Notes (Addendum)
06/29/2019 Philomena Course   07-Feb-1961  [id number]  Primary Physician Antony Blackbird, MD Primary Cardiologist: Lorretta Harp MD [place] Car[place], [place]  HPI:  [name] [name] is a 58 y.o. thin appearing single Caucasian female mother of 2 children, quit grandmother 6 grandchildren referred by Dr. Chapman Fitch for cardiovascular evaluation because of symptomatic hypotension.  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  Her only risk factor includes diabetes which she has had for last 52 years and family history with a brother who died of a myocardial infarction age 52.  She is never had a heart attack or stroke.  She denies chest pain or shortness of breath.  She is noted that she was hypotensive for many years symptomatic for the last several with an orthostatic component.  She is also has diabetic peripheral neuropathy and has been diabetic since 2011.   Current Meds  Medication Sig  . acetaminophen (TYLENOL) 325 MG tablet Take 2 tablets (650 mg total) by mouth every 6 (six) hours as needed.  . DULoxetine (CYMBALTA) 60 MG capsule Take 1 capsule (60 mg total) by mouth 2 (two) times daily.  Marland Kitchen gabapentin (NEURONTIN) 600 MG tablet 1 pill in the am, on mid-day/early evening and 2 at bedtime (Patient taking differently: Take 600-1,200 mg by mouth See admin instructions. 1 pill in the am, on mid-day/early evening and 2 at bedtime)  . glucose blood test strip Relion Testing Strips Use as instructed  . HUMALOG KWIKPEN 100 UNIT/ML KwikPen Inject 0.1 mLs (10 Units total) into the skin 3 (three) times daily.  Marland Kitchen ibuprofen (ADVIL,MOTRIN) 200 MG tablet Take 800 mg by mouth daily as needed for mild pain.   . Insulin Glargine (LANTUS SOLOSTAR) 100 UNIT/ML Solostar Pen Inject 25 Units into the skin 2 (two) times daily. Increase dose by 1Unit each day your BG is over 150 in the morning.  . Insulin Pen Needle (PEN NEEDLES) 31G X 8 MM MISC 1 Units by Does not apply route daily.      Allergies  Allergen Reactions  . Tramadol Nausea And Vomiting    REACTION: Projectile vomiting    Social History   Socioeconomic History  . Marital status: Single    Spouse name: Not on file  . Number of children: Not on file  . Years of education: Not on file  . Highest education level: Not on file  Occupational History  . Not on file  Tobacco Use  . Smoking status: Former Smoker    Quit date: 08/03/1978    Years since quitting: 40.9  . Smokeless tobacco: Never Used  Substance and Sexual Activity  . Alcohol use: No    Comment: Former  . Drug use: No  . Sexual activity: Yes    Birth control/protection: Surgical  Other Topics Concern  . 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    Social Determinants of Health   Financial Resource Strain:   . Difficulty of Paying Living Expenses: Not on file  Food Insecurity:   . Worried About Charity fundraiser in the Last Year: Not on file  . Ran Out of Food in the Last Year: Not on file  Transportation Needs:   . Lack of Transportation (Medical): Not on file  . Lack of Transportation (Non-Medical): Not on file  Physical Activity:   . Days of Exercise per Week: Not on  file  . Minutes of Exercise per Session: Not on file  Stress:   . Feeling of Stress : Not on file  Social Connections:   . Frequency of Communication with Friends and Family: Not on file  . Frequency of Social Gatherings with Friends and Family: Not on file  . Attends Religious Services: Not on file  . Active Member of Clubs or Organizations: Not on file  . Attends Archivist Meetings: Not on file  . Marital Status: Not on file  Intimate Partner Violence:   . Fear of Current or Ex-Partner: Not on file  . Emotionally Abused: Not on file  . Physically Abused: Not on file  . Sexually Abused: Not on file     Review of Systems: General: negative for chills, fever, night sweats or weight changes.   Cardiovascular: negative for chest pain, dyspnea on exertion, edema, orthopnea, palpitations, paroxysmal nocturnal dyspnea or shortness of breath Dermatological: negative for rash Respiratory: negative for cough or wheezing Urologic: negative for hematuria Abdominal: negative for nausea, vomiting, diarrhea, bright red blood per rectum, melena, or hematemesis Neurologic: negative for visual changes, syncope, or dizziness All other systems reviewed and are otherwise negative except as noted above.    Blood pressure 103/60, pulse (!) 114, height 5\' 6"  (1.676 m), weight 136 lb (61.7 kg).  General appearance: alert and no distress Neck: no adenopathy, no carotid bruit, no JVD, supple, symmetrical, trachea midline and thyroid not enlarged, symmetric, no tenderness/mass/nodules Lungs: clear to auscultation bilaterally Heart: regular rate and rhythm, S1, S2 normal, no murmur, click, rub or gallop Extremities: extremities normal, atraumatic, no cyanosis or edema Pulses: 2+ and symmetric Skin: Skin color, texture, turgor normal. No rashes or lesions Neurologic: Alert and oriented X 3, normal strength and tone. Normal symmetric reflexes. Normal coordination and gait  EKG not performed today  ASSESSMENT AND PLAN:   Hypotension Ms. Steady was referred to me by Dr. Chapman Fitch for evaluation of symptomatic hypotension.  She is been hypertensive for years more pronounced over the last several years.  Her blood pressures always been low.  She does have an adequate amount of salt in her diet.  She is not on any antihypertensive medications.  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  She may benefit from either Florinef, Midrin or Northera.  I will review with pharmacist prior to deciding.  I am also going to get a 2D echocardiogram.      Lorretta Harp MD [facility], [facility] 06/29/2019 10:49 AM    I spoke with patient after Dr. Gwenlyn Found saw her.  She notes the dizziness occurs at all times of the day, worse in  the mornings (before lunch), but still problematic in afternoons and evenings.  She has increased the amount of salt in her diet, with no noticeable benefit.  Will have her start with fludrocortisone 0.1 mg daily.  Her potassium is good at 4.5 (drawn yesterday).  Patient will contact office in 2-3 weeks or sooner if having difficulty with medication.

## 2019-06-29 NOTE — Addendum Note (Signed)
Addended by: Cain Sieve on: 06/29/2019 11:31 AM   Modules accepted: Orders

## 2019-06-29 NOTE — Assessment & Plan Note (Signed)
[name] [name] was referred to me by Dr. Chapman Fitch for evaluation of symptomatic hypotension.  She is been hypertensive for years more pronounced over the last several years.  Her blood pressures always been low.  She does have an adequate amount of salt in her diet.  She is not on any antihypertensive medications.  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  She may benefit from either Florinef, Midrin or Northera.  I will review with pharmacist prior to deciding.  I am also going to get a 2D echocardiogram.

## 2019-06-29 NOTE — Patient Instructions (Signed)
Medication Instructions:  Your physician recommends that you continue on your current medications as directed. Please refer to the Current Medication list given to you today.  If you need a refill on your cardiac medications before your next appointment, please call your pharmacy.   Lab work: NONE  Testing/Procedures: Your physician has requested that you have an echocardiogram. Echocardiography is a painless test that uses sound waves to create images of your heart. It provides your doctor with information about the size and shape of your heart and how well your heart's chambers and valves are working. This procedure takes approximately one hour. There are no restrictions for this procedure. [address] [place]  Follow-Up: At [facility], you and your health needs are our priority.  As part of our continuing mission to provide you with exceptional heart care, we have created designated Provider Care Teams.  These Care Teams include your primary Cardiologist (physician) and Advanced Practice Providers (APPs -  Physician Assistants and Nurse Practitioners) who all work together to provide you with the care you need, when you need it. You may see Dr Gwenlyn Found or one of the following Advanced Practice Providers on your designated Care Team:    Kerin Ransom, PA-C  [place], [place]  Coletta Memos, [place] Your physician wants you to follow-up in: 3 months with a Surveyor, minerals. Your physician wants you to follow-up in: 6 months with Dr Gwenlyn Found.

## 2019-06-30 ENCOUNTER — Emergency Department (HOSPITAL_COMMUNITY): Payer: Self-pay

## 2019-06-30 ENCOUNTER — Other Ambulatory Visit: Payer: Self-pay

## 2019-06-30 ENCOUNTER — Inpatient Hospital Stay (HOSPITAL_COMMUNITY)
Admission: EM | Admit: 2019-06-30 | Discharge: 2019-07-02 | DRG: 312 | Disposition: A | Discharge status: Home Health | Payer: Self-pay | Attending: Internal Medicine | Admitting: Internal Medicine

## 2019-06-30 ENCOUNTER — Encounter (HOSPITAL_COMMUNITY): Payer: Self-pay

## 2019-06-30 DIAGNOSIS — Z90710 Acquired absence of both cervix and uterus: Secondary | ICD-10-CM

## 2019-06-30 DIAGNOSIS — G894 Chronic pain syndrome: Secondary | ICD-10-CM | POA: Diagnosis present

## 2019-06-30 DIAGNOSIS — G2581 Restless legs syndrome: Secondary | ICD-10-CM | POA: Diagnosis present

## 2019-06-30 DIAGNOSIS — F329 Major depressive disorder, single episode, unspecified: Secondary | ICD-10-CM | POA: Diagnosis present

## 2019-06-30 DIAGNOSIS — Z818 Family history of other mental and behavioral disorders: Secondary | ICD-10-CM

## 2019-06-30 DIAGNOSIS — M5010 Cervical disc disorder with radiculopathy, unspecified cervical region: Secondary | ICD-10-CM | POA: Diagnosis present

## 2019-06-30 DIAGNOSIS — E1165 Type 2 diabetes mellitus with hyperglycemia: Secondary | ICD-10-CM | POA: Diagnosis present

## 2019-06-30 DIAGNOSIS — I491 Atrial premature depolarization: Secondary | ICD-10-CM | POA: Diagnosis present

## 2019-06-30 DIAGNOSIS — Z801 Family history of malignant neoplasm of trachea, bronchus and lung: Secondary | ICD-10-CM

## 2019-06-30 DIAGNOSIS — Z87891 Personal history of nicotine dependence: Secondary | ICD-10-CM

## 2019-06-30 DIAGNOSIS — H409 Unspecified glaucoma: Secondary | ICD-10-CM | POA: Diagnosis present

## 2019-06-30 DIAGNOSIS — R52 Pain, unspecified: Secondary | ICD-10-CM

## 2019-06-30 DIAGNOSIS — E876 Hypokalemia: Secondary | ICD-10-CM | POA: Diagnosis not present

## 2019-06-30 DIAGNOSIS — E1142 Type 2 diabetes mellitus with diabetic polyneuropathy: Secondary | ICD-10-CM | POA: Diagnosis present

## 2019-06-30 DIAGNOSIS — Z20828 Contact with and (suspected) exposure to other viral communicable diseases: Secondary | ICD-10-CM | POA: Diagnosis present

## 2019-06-30 DIAGNOSIS — Z833 Family history of diabetes mellitus: Secondary | ICD-10-CM

## 2019-06-30 DIAGNOSIS — Z79899 Other long term (current) drug therapy: Secondary | ICD-10-CM

## 2019-06-30 DIAGNOSIS — F32A Depression, unspecified: Secondary | ICD-10-CM | POA: Diagnosis present

## 2019-06-30 DIAGNOSIS — Z885 Allergy status to narcotic agent status: Secondary | ICD-10-CM

## 2019-06-30 DIAGNOSIS — Z9111 Patient's noncompliance with dietary regimen: Secondary | ICD-10-CM

## 2019-06-30 DIAGNOSIS — R55 Syncope and collapse: Secondary | ICD-10-CM | POA: Diagnosis present

## 2019-06-30 DIAGNOSIS — Z841 Family history of disorders of kidney and ureter: Secondary | ICD-10-CM

## 2019-06-30 DIAGNOSIS — I951 Orthostatic hypotension: Principal | ICD-10-CM | POA: Diagnosis present

## 2019-06-30 DIAGNOSIS — Z794 Long term (current) use of insulin: Secondary | ICD-10-CM

## 2019-06-30 LAB — URINALYSIS, ROUTINE W REFLEX MICROSCOPIC
Bilirubin Urine: NEGATIVE
Glucose, UA: 150 mg/dL — AB
Hgb urine dipstick: NEGATIVE
Ketones, ur: NEGATIVE mg/dL
Leukocytes,Ua: NEGATIVE
Nitrite: NEGATIVE
Protein, ur: NEGATIVE mg/dL
Specific Gravity, Urine: 1.005 (ref 1.005–1.030)
pH: 9 — ABNORMAL HIGH (ref 5.0–8.0)

## 2019-06-30 LAB — CBC WITH DIFFERENTIAL/PLATELET
Abs Immature Granulocytes: 0.05 10*3/uL (ref 0.00–0.07)
Basophils Absolute: 0 10*3/uL (ref 0.0–0.1)
Basophils Relative: 0 %
Eosinophils Absolute: 0.1 10*3/uL (ref 0.0–0.5)
Eosinophils Relative: 1 %
HCT: 40.4 % (ref 36.0–46.0)
Hemoglobin: 13.5 g/dL (ref 12.0–15.0)
Immature Granulocytes: 0 %
Lymphocytes Relative: 12 %
Lymphs Abs: 1.4 10*3/uL (ref 0.7–4.0)
MCH: 31.1 pg (ref 26.0–34.0)
MCHC: 33.4 g/dL (ref 30.0–36.0)
MCV: 93.1 fL (ref 80.0–100.0)
Monocytes Absolute: 0.7 10*3/uL (ref 0.1–1.0)
Monocytes Relative: 6 %
Neutro Abs: 9.4 10*3/uL — ABNORMAL HIGH (ref 1.7–7.7)
Neutrophils Relative %: 81 %
Platelets: 250 10*3/uL (ref 150–400)
RBC: 4.34 MIL/uL (ref 3.87–5.11)
RDW: 11.8 % (ref 11.5–15.5)
WBC: 11.7 10*3/uL — ABNORMAL HIGH (ref 4.0–10.5)
nRBC: 0 % (ref 0.0–0.2)

## 2019-06-30 LAB — RESPIRATORY PANEL BY RT PCR (FLU A&B, COVID)
Influenza A by PCR: NEGATIVE
Influenza B by PCR: NEGATIVE
SARS Coronavirus 2 by RT PCR: NEGATIVE

## 2019-06-30 LAB — BASIC METABOLIC PANEL
Anion gap: 10 (ref 5–15)
BUN: 12 mg/dL (ref 6–20)
CO2: 25 mmol/L (ref 22–32)
Calcium: 9.3 mg/dL (ref 8.9–10.3)
Chloride: 105 mmol/L (ref 98–111)
Creatinine, Ser: 0.56 mg/dL (ref 0.44–1.00)
GFR calc Af Amer: >60 mL/min (ref >60)
GFR calc non Af Amer: >60 mL/min (ref >60)
Glucose, Bld: 84 mg/dL (ref 70–99)
Potassium: 3.5 mmol/L (ref 3.5–5.1)
Sodium: 140 mmol/L (ref 135–145)

## 2019-06-30 LAB — TROPONIN I (HIGH SENSITIVITY): Troponin I (High Sensitivity): 6 ng/L (ref <18)

## 2019-06-30 LAB — RAPID URINE DRUG SCREEN, HOSP PERFORMED
Amphetamines: NOT DETECTED
Barbiturates: NOT DETECTED
Benzodiazepines: NOT DETECTED
Cocaine: NOT DETECTED
Opiates: NOT DETECTED
Tetrahydrocannabinol: NOT DETECTED

## 2019-06-30 LAB — CBG MONITORING, ED
Glucose-Capillary: 79 mg/dL (ref 70–99)
Glucose-Capillary: 214 mg/dL — ABNORMAL HIGH (ref 70–99)
Glucose-Capillary: 308 mg/dL — ABNORMAL HIGH (ref 70–99)

## 2019-06-30 LAB — D-DIMER, QUANTITATIVE: D-Dimer, Quant: <0.27 ug/mL-FEU (ref 0.00–0.50)

## 2019-06-30 LAB — POC SARS CORONAVIRUS 2 AG -  ED: SARS Coronavirus 2 Ag: NEGATIVE

## 2019-06-30 MED ORDER — ONDANSETRON HCL 4 MG PO TABS: 4.0000 mg | ORAL_TABLET | Freq: Four times a day (QID) | ORAL | Status: DC | PRN

## 2019-06-30 MED ORDER — ENOXAPARIN SODIUM 40 MG/0.4ML ~~LOC~~ SOLN
40.0000 mg | SUBCUTANEOUS | Status: DC
  Administered 2019-07-01 (×2): 40 mg via SUBCUTANEOUS
  Filled 2019-06-30 (×2): qty 0.4

## 2019-06-30 MED ORDER — DULOXETINE HCL 60 MG PO CPEP
60.0000 mg | ORAL_CAPSULE | Freq: Two times a day (BID) | ORAL | Status: DC
  Administered 2019-06-30 – 2019-07-02 (×4): 60 mg via ORAL
  Filled 2019-06-30 (×4): qty 1

## 2019-06-30 MED ORDER — ACETAMINOPHEN 325 MG PO TABS
650.0000 mg | ORAL_TABLET | Freq: Four times a day (QID) | ORAL | Status: DC | PRN
  Administered 2019-07-01 (×2): 650 mg via ORAL
  Filled 2019-06-30 (×2): qty 2

## 2019-06-30 MED ORDER — FLUDROCORTISONE ACETATE 0.1 MG PO TABS
0.1000 mg | ORAL_TABLET | Freq: Every day | ORAL | Status: DC
  Administered 2019-07-01 – 2019-07-02 (×2): 0.1 mg via ORAL
  Filled 2019-06-30 (×2): qty 1

## 2019-06-30 MED ORDER — SODIUM CHLORIDE 0.9% FLUSH
3.0000 mL | Freq: Two times a day (BID) | INTRAVENOUS | Status: DC
  Administered 2019-07-01 – 2019-07-02 (×3): 3 mL via INTRAVENOUS

## 2019-06-30 MED ORDER — INSULIN ASPART 100 UNIT/ML ~~LOC~~ SOLN
0.0000 [IU] | Freq: Three times a day (TID) | SUBCUTANEOUS | Status: DC
  Administered 2019-07-01 (×2): 3 [IU] via SUBCUTANEOUS
  Administered 2019-07-01 – 2019-07-02 (×2): 8 [IU] via SUBCUTANEOUS
  Administered 2019-07-02: 5 [IU] via SUBCUTANEOUS

## 2019-06-30 MED ORDER — ONDANSETRON HCL 4 MG/2ML IJ SOLN: 4.0000 mg | Freq: Four times a day (QID) | INTRAMUSCULAR | Status: DC | PRN

## 2019-06-30 MED ORDER — INSULIN ASPART 100 UNIT/ML ~~LOC~~ SOLN
0.0000 [IU] | Freq: Every day | SUBCUTANEOUS | Status: DC
  Administered 2019-07-01: 4 [IU] via SUBCUTANEOUS
  Administered 2019-07-01: 2 [IU] via SUBCUTANEOUS

## 2019-06-30 MED ORDER — SODIUM CHLORIDE 0.9 % IV SOLN
INTRAVENOUS | Status: DC
  Administered 2019-07-01: via INTRAVENOUS

## 2019-06-30 MED ORDER — SODIUM CHLORIDE 0.9 % IV BOLUS
1000.0000 mL | Freq: Once | INTRAVENOUS | Status: AC
  Administered 2019-06-30: 1000 mL via INTRAVENOUS

## 2019-06-30 MED ORDER — INSULIN GLARGINE 100 UNIT/ML ~~LOC~~ SOLN
25.0000 [IU] | Freq: Two times a day (BID) | SUBCUTANEOUS | Status: DC
  Filled 2019-06-30 (×3): qty 0.25

## 2019-06-30 MED FILL — ?BASAGLAR 100 UNITS/ML KWPE: 100 | 12 days supply | Qty: 6 | Fill #2

## 2019-06-30 NOTE — ED Notes (Signed)
CBG 79 

## 2019-06-30 NOTE — ED Notes (Signed)
Pt given ginger ale and [place] sandwich bag by EDP

## 2019-06-30 NOTE — ED Notes (Signed)
ED TO INPATIENT HANDOFF REPORT  ED Nurse Name and Phone #: William Hamburger RN [phone]  S Name/Age/Gender [name] [name] 58 y.o. female Room/Bed: 037C/037C  Code Status   Code Status: Prior  Home/SNF/Other Home Patient oriented to: self, place, time and situation Is this baseline? No   Triage Complete: Triage complete  Chief Complaint Syncope and collapse [R55]  Triage Note Pt from home with ems for syncopal episode. Pt was sitting at the kitchen table and then she woke up on the floor, pt was too weak to get up so she crawled to her granddaughters room who called EMS. No neuro deficits noted with EMS, pt denies pain, SOB or n/v. Pt a.o but states she doesn't feel herself. Pt CBG 112 with EMS, states her CBG is normally 300-400 at home, takes insulin. VSS    Allergies Allergies  Allergen Reactions  . Tramadol Nausea And Vomiting    REACTION: Projectile vomiting    Level of Care/Admitting Diagnosis ED Disposition    ED Disposition Condition [facility]: [place] [[id number]]  Level of Care: Telemetry Cardiac [103]  I expect the patient will be discharged within 24 hours: Yes  LOW acuity---Tx typically complete <24 hrs---ACUTE conditions typically can be evaluated <24 hours---LABS likely to return to acceptable levels <24 hours---IS near functional baseline---EXPECTED to return to current living arrangement---NOT newly hypoxic: Meets criteria for 5C-Observation unit  Covid Evaluation: Asymptomatic Screening Protocol (No Symptoms)  Diagnosis: Syncope and collapse [780.2.ICD-9-CM]  Admitting Physician: Elwyn Reach [[id number]]  Attending Physician: Elwyn Reach [[id number]]       B Medical/Surgery History Past Medical History:  Diagnosis Date  . Cervical disc disorder with radiculopathy of cervical region   . Chest pain   . Chronic low back pain    HNP  . Chronic pain syndrome   . Depression dx 1997  . Depression   . Diabetes mellitus,  type 2 ([place]) 2011   No insulin  . Diabetic polyneuropathy ([place])   . GERD (gastroesophageal reflux disease)   . Glaucoma   . Headache(784.0)   . Herniated disc   . Hypertension    Lab 11/2011:  CXR, EKG, CBC, TSH, BMet, troponin-normal; lipid profile: 188, 131, 36, 126   . Non-compliance   . Pancreatic cyst    Endoscopic aspiration in 09/2009  . Pneumonia 08/02/2012  . Shingles   . Vertigo    Past Surgical History:  Procedure Laterality Date  . ABDOMINAL HYSTERECTOMY    . CESAREAN SECTION       A IV Location/Drains/Wounds Patient Lines/Drains/Airways Status   Active Line/Drains/Airways    Name:   Placement date:   Placement time:   Site:   Days:   Peripheral IV 06/30/19 Left Antecubital   06/30/19    1638    Antecubital   less than 1          Intake/Output Last 24 hours No intake or output data in the 24 hours ending 06/30/19 2235  Labs/Imaging Results for orders placed or performed during the hospital encounter of 06/30/19 (from the past 48 hour(s))  CBG monitoring, ED     Status: None   Collection Time: 06/30/19  4:41 PM  Result Value Ref Range   Glucose-Capillary 79 70 - 99 mg/dL  Basic metabolic panel     Status: None   Collection Time: 06/30/19  5:38 PM  Result Value Ref Range   Sodium 140 135 - 145 mmol/L  Potassium 3.5 3.5 - 5.1 mmol/L   Chloride 105 98 - 111 mmol/L   CO2 25 22 - 32 mmol/L   Glucose, Bld 84 70 - 99 mg/dL   BUN 12 6 - 20 mg/dL   Creatinine, Ser 0.56 0.44 - 1.00 mg/dL   Calcium 9.3 8.9 - 10.3 mg/dL   GFR calc non Af Amer >60 >60 mL/min   GFR calc Af Amer >60 >60 mL/min   Anion gap 10 5 - 15    Comment: Performed at [facility] [address], [place], [place] [address]  CBC with Differential     Status: Abnormal   Collection Time: 06/30/19  5:38 PM  Result Value Ref Range   WBC 11.7 (H) 4.0 - 10.5 K/uL   RBC 4.34 3.87 - 5.11 MIL/uL   Hemoglobin 13.5 12.0 - 15.0 g/dL   HCT 40.4 36.0 - 46.0 %   MCV 93.1 80.0 - 100.0 fL   MCH  31.1 26.0 - 34.0 pg   MCHC 33.4 30.0 - 36.0 g/dL   RDW 11.8 11.5 - 15.5 %   Platelets 250 150 - 400 K/uL   nRBC 0.0 0.0 - 0.2 %   Neutrophils Relative % 81 %   Neutro Abs 9.4 (H) 1.7 - 7.7 K/uL   Lymphocytes Relative 12 %   Lymphs Abs 1.4 0.7 - 4.0 K/uL   Monocytes Relative 6 %   Monocytes Absolute 0.7 0.1 - 1.0 K/uL   Eosinophils Relative 1 %   Eosinophils Absolute 0.1 0.0 - 0.5 K/uL   Basophils Relative 0 %   Basophils Absolute 0.0 0.0 - 0.1 K/uL   Immature Granulocytes 0 %   Abs Immature Granulocytes 0.05 0.00 - 0.07 K/uL    Comment: Performed at [facility] [address], [place], [place] [address]  D-dimer, quantitative (not at [facility])     Status: None   Collection Time: 06/30/19  5:38 PM  Result Value Ref Range   D-Dimer, Quant <0.27 0.00 - 0.50 ug/mL-FEU    Comment: (NOTE) At the manufacturer cut-off of 0.50 ug/mL FEU, this assay has been documented to exclude PE with a sensitivity and negative predictive value of 97 to 99%.  At this time, this assay has not been approved by the FDA to exclude DVT/VTE. Results should be correlated with clinical presentation. Performed at [facility], [place] [address] [place], [place] [address]   Troponin I (High Sensitivity)     Status: None   Collection Time: 06/30/19  5:38 PM  Result Value Ref Range   Troponin I (High Sensitivity) 6 <18 ng/L    Comment: (NOTE) Elevated high sensitivity troponin I (hsTnI) values and significant  changes across serial measurements may suggest ACS but many other  chronic and acute conditions are known to elevate hsTnI results.  Refer to the Links section for chest pain algorithms and additional  guidance. Performed at [facility], [place] [address], [place], [place] [address]   POC SARS Coronavirus 2 Ag-ED - Nasal Swab (BD Veritor Kit)     Status: None   Collection Time: 06/30/19  6:40 PM  Result Value Ref Range   SARS Coronavirus 2 Ag NEGATIVE NEGATIVE    Comment:  (NOTE) SARS-CoV-2 antigen NOT DETECTED.  Negative results are presumptive.  Negative results do not preclude SARS-CoV-2 infection and should not be used as the sole basis for treatment or other patient management decisions, including infection  control decisions, particularly in  the presence of clinical signs and  symptoms consistent with COVID-19, or in those who have been in contact with the virus.  Negative results must be combined with clinical observations, patient history, and epidemiological information. The expected result is Negative. Fact Sheet for Patients: PodPark.tn Fact Sheet for Healthcare Providers: GiftContent.is This test is not yet approved or cleared by the [place] FDA and  has been authorized for detection and/or diagnosis of SARS-CoV-2 by FDA under an Emergency Use Authorization (EUA).  This EUA will remain in effect (meaning this test can be used) for the duration of  the COVID-19 de claration under Section 564(b)(1) of the Act, 21 U.S.C. section 360bbb-3(b)(1), unless the authorization is terminated or revoked sooner.   CBG monitoring, ED     Status: Abnormal   Collection Time: 06/30/19  6:47 PM  Result Value Ref Range   Glucose-Capillary 214 (H) 70 - 99 mg/dL  Respiratory Panel by RT PCR (Flu A&B, Covid) - Nasopharyngeal Swab     Status: None   Collection Time: 06/30/19  8:29 PM   Specimen: Nasopharyngeal Swab  Result Value Ref Range   SARS Coronavirus 2 by RT PCR NEGATIVE NEGATIVE    Comment: (NOTE) SARS-CoV-2 target nucleic acids are NOT DETECTED. The SARS-CoV-2 RNA is generally detectable in upper respiratoy specimens during the acute phase of infection. The lowest concentration of SARS-CoV-2 viral copies this assay can detect is 131 copies/mL. A negative result does not preclude SARS-Cov-2 infection and should not be used as the sole basis for treatment or other patient management  decisions. A negative result may occur with  improper specimen collection/handling, submission of specimen other than nasopharyngeal swab, presence of viral mutation(s) within the areas targeted by this assay, and inadequate number of viral copies (<131 copies/mL). A negative result must be combined with clinical observations, patient history, and epidemiological information. The expected result is Negative. Fact Sheet for Patients:  PinkCheek.be Fact Sheet for Healthcare Providers:  GravelBags.it This test is not yet ap proved or cleared by the [place] FDA and  has been authorized for detection and/or diagnosis of SARS-CoV-2 by FDA under an Emergency Use Authorization (EUA). This EUA will remain  in effect (meaning this test can be used) for the duration of the COVID-19 declaration under Section 564(b)(1) of the Act, 21 U.S.C. section 360bbb-3(b)(1), unless the authorization is terminated or revoked sooner.    Influenza A by PCR NEGATIVE NEGATIVE   Influenza B by PCR NEGATIVE NEGATIVE    Comment: (NOTE) The Xpert Xpress SARS-CoV-2/FLU/RSV assay is intended as an aid in  the diagnosis of influenza from Nasopharyngeal swab specimens and  should not be used as a sole basis for treatment. Nasal washings and  aspirates are unacceptable for Xpert Xpress SARS-CoV-2/FLU/RSV  testing. Fact Sheet for Patients: PinkCheek.be Fact Sheet for Healthcare Providers: GravelBags.it This test is not yet approved or cleared by the [place] FDA and  has been authorized for detection and/or diagnosis of SARS-CoV-2 by  FDA under an Emergency Use Authorization (EUA). This EUA will remain  in effect (meaning this test can be used) for the duration of the  Covid-19 declaration under Section 564(b)(1) of the Act, 21  U.S.C. section 360bbb-3(b)(1), unless the authorization is   terminated or revoked. Performed at [facility], [place] [address] [place], [place] [address]   Rapid urine drug screen (hospital performed)     Status: None   Collection Time: 06/30/19  9:06 PM  Result Value Ref  Range   Opiates NONE DETECTED NONE DETECTED   Cocaine NONE DETECTED NONE DETECTED   Benzodiazepines NONE DETECTED NONE DETECTED   Amphetamines NONE DETECTED NONE DETECTED   Tetrahydrocannabinol NONE DETECTED NONE DETECTED   Barbiturates NONE DETECTED NONE DETECTED    Comment: (NOTE) DRUG SCREEN FOR MEDICAL PURPOSES ONLY.  IF CONFIRMATION IS NEEDED FOR ANY PURPOSE, NOTIFY LAB WITHIN 5 DAYS. LOWEST DETECTABLE LIMITS FOR URINE DRUG SCREEN Drug Class                     Cutoff (ng/mL) Amphetamine and metabolites    1000 Barbiturate and metabolites    200 Benzodiazepine                 742 Tricyclics and metabolites     300 Opiates and metabolites        300 Cocaine and metabolites        300 THC                            50 Performed at [facility], [place] [address] [place], [place] [address]   Urinalysis, Routine w reflex microscopic     Status: Abnormal   Collection Time: 06/30/19  9:07 PM  Result Value Ref Range   Color, Urine STRAW (A) YELLOW   APPearance CLEAR CLEAR   Specific Gravity, Urine 1.005 1.005 - 1.030   pH 9.0 (H) 5.0 - 8.0   Glucose, UA 150 (A) NEGATIVE mg/dL   Hgb urine dipstick NEGATIVE NEGATIVE   Bilirubin Urine NEGATIVE NEGATIVE   Ketones, ur NEGATIVE NEGATIVE mg/dL   Protein, ur NEGATIVE NEGATIVE mg/dL   Nitrite NEGATIVE NEGATIVE   Leukocytes,Ua NEGATIVE NEGATIVE    Comment: Performed at [facility] [address] [place], [place] [address]   DG Chest Port 1 View  Result Date: 06/30/2019 CLINICAL DATA:  Pain EXAM: PORTABLE CHEST 1 VIEW COMPARISON:  Radiograph 01/22/2014 FINDINGS: No consolidation, features of edema, pneumothorax, or effusion. Pulmonary vascularity is normally distributed. The cardiomediastinal contours  are unremarkable. No acute osseous or soft tissue abnormality. IMPRESSION: No acute cardiopulmonary abnormality. Electronically Signed   By: Lovena Le M.D.   On: 06/30/2019 18:41    Pending Labs [employer] (From admission, onward)    Start     Ordered   Signed and Held  HIV Antibody (routine testing w rflx)  (HIV Antibody (Routine testing w reflex) panel)  Once,   R     Signed and Held   Signed and Held  CBC  (enoxaparin (LOVENOX)    CrCl >/= 30 ml/min)  Once,   R    Comments: Baseline for enoxaparin therapy IF NOT ALREADY DRAWN.  Notify MD if PLT < 100 K.    Signed and Held   Signed and Held  Creatinine, serum  (enoxaparin (LOVENOX)    CrCl >/= 30 ml/min)  Once,   R    Comments: Baseline for enoxaparin therapy IF NOT ALREADY DRAWN.    Signed and Held   Signed and Held  Creatinine, serum  (enoxaparin (LOVENOX)    CrCl >/= 30 ml/min)  Weekly,   R    Comments: while on enoxaparin therapy    Signed and Held   Signed and Held  Comprehensive metabolic panel  Tomorrow morning,   R     Signed and Held   Signed and Held  CBC  Tomorrow morning,  R     Signed and Held          Vitals/Pain Today's Vitals   06/30/19 1900 06/30/19 1930 06/30/19 2030 06/30/19 2100  BP: (!) 145/72 (!) 151/78 118/81 (!) 148/73  Pulse: (!) 117 (!) 117 (!) 119 (!) 116  Resp: 15 16  17   Temp:      TempSrc:      SpO2: 99% 99% 95% 100%  Weight:      Height:      PainSc:        Isolation Precautions No active isolations  Medications Medications  DULoxetine (CYMBALTA) DR capsule 60 mg (has no administration in time range)  insulin glargine (LANTUS) injection 25 Units (has no administration in time range)  acetaminophen (TYLENOL) tablet 650 mg (has no administration in time range)  fludrocortisone (FLORINEF) tablet 0.1 mg (has no administration in time range)  sodium chloride 0.9 % bolus 1,000 mL (0 mLs Intravenous Stopped 06/30/19 1948)    Mobility walks Moderate fall risk   Focused  Assessments Cardiac Assessment Handoff:    Lab Results  Component Value Date   CKTOTAL 27 (L) 07/19/2017   TROPONINI <0.03 02/18/2017   Lab Results  Component Value Date   DDIMER <0.27 06/30/2019   Does the Patient currently have chest pain? No     R Recommendations: See Admitting Provider Note  Report given to:   Additional Notes:

## 2019-06-30 NOTE — ED Triage Notes (Signed)
Pt from home with ems for syncopal episode. Pt was sitting at the kitchen table and then she woke up on the floor, pt was too weak to get up so she crawled to her granddaughters room who called EMS. No neuro deficits noted with EMS, pt denies pain, SOB or n/v. Pt a.o but states she doesn't feel herself. Pt CBG 112 with EMS, states her CBG is normally 300-400 at home, takes insulin. VSS

## 2019-06-30 NOTE — ED Provider Notes (Signed)
[facility] EMERGENCY DEPARTMENT Provider Note   CSN: [id number] Arrival date & time: 06/30/19  1625     History Chief Complaint  Patient presents with  . Loss of Consciousness    [name] [name] is a 58 y.o. female.  HPI   This 58 year old female with a history of type 2 diabetes on insulin, HYPOtension, presenting to the emergency department with episode of syncope.  Patient reports that she was driving home today in her truck, and abruptly had a sensation like she was in herself.  She described an out of body sensation of feeling like she was foggy and weak.  Says she had difficulty getting out of her truck and getting into her house.  She thought her blood sugars were low when checked them and they were 86.  She ate an orange at the kitchen table.  When she tried to get up she became lightheaded and lost consciousness.  She says the next remembers she was waking up on the ground.  She was able to crawl to the next room and have her granddaughter call 911.  She continues to feel lightheaded and foggy in the ED.  She has generalized weakness.  She does not have any shortness of breath or palpitations.  She says she has never had symptoms like this in the past.  She does report having congestion for 1 week.  She says her daughter had a Covid exposure at work and is being tested, but the results were not back yet.  She reports that she has a history of hypotension, and this was recently prescribed medicine to keep her blood pressure up.  She has not started taking this medicine yet.  She has an echocardiogram scheduled in 10 days as an outpatient to evaluate "why my blood pressure is so low."]  She takes Lantus 25 units BID and humalog up to 10 units TID before meals.  She took her morning lantus and humalog today and ate an english muffin.  She did not take her lunchtime humalog because she had not eaten anything.  She says typically her BS ranges in the 300's, and she feels  like the 80's are "low" for her.  EMS reported BS 112.     Past Medical History:  Diagnosis Date  . Cervical disc disorder with radiculopathy of cervical region   . Chest pain   . Chronic low back pain    HNP  . Chronic pain syndrome   . Depression dx 1997  . Depression   . Diabetes mellitus, type 2 ([place]) 2011   No insulin  . Diabetic polyneuropathy ([place])   . GERD (gastroesophageal reflux disease)   . Glaucoma   . Headache(784.0)   . Herniated disc   . Hypertension    Lab 11/2011:  CXR, EKG, CBC, TSH, BMet, troponin-normal; lipid profile: 188, 131, 36, 126   . Non-compliance   . Pancreatic cyst    Endoscopic aspiration in 09/2009  . Pneumonia 08/02/2012  . Shingles   . Vertigo     Patient Active Problem List   Diagnosis Date Noted  . Hypotension 06/29/2019  . Chronic pain syndrome 08/24/2017  . Diabetic polyneuropathy associated with type 2 diabetes mellitus ([place]) 08/24/2017  . Restless leg syndrome 08/24/2017  . Vertigo 04/09/2016  . Acute sinusitis 06/27/2015  . Cervical disc disorder with radiculopathy of cervical region 02/20/2015  . Neck muscle spasm 10/23/2014  . Healthcare maintenance 10/23/2014  . Depression 11/25/2013  . Diabetes type  2, uncontrolled ([place])     Past Surgical History:  Procedure Laterality Date  . ABDOMINAL HYSTERECTOMY    . CESAREAN SECTION       OB History   No obstetric history on file.     Family History  Problem Relation Age of Onset  . Depression Mother        type 1  . Diabetes Mother   . Renal Disease Mother   . Lung cancer Father     Social History   Tobacco Use  . Smoking status: Former Smoker    Quit date: 08/03/1978    Years since quitting: 40.9  . Smokeless tobacco: Never Used  Substance Use Topics  . Alcohol use: No    Comment: Former  . Drug use: No    Home Medications Prior to Admission medications   Medication Sig Start Date End Date Taking? Authorizing Provider  acetaminophen (TYLENOL) 325 MG  tablet Take 2 tablets (650 mg total) by mouth every 6 (six) hours as needed. 08/18/17   Nuala Alpha, DO  DULoxetine (CYMBALTA) 60 MG capsule Take 1 capsule (60 mg total) by mouth 2 (two) times daily. 06/28/19   Fulp, Cammie, MD  fludrocortisone (FLORINEF) 0.1 MG tablet Take 1 tablet (0.1 mg total) by mouth daily. 06/29/19   Lorretta Harp, MD  gabapentin (NEURONTIN) 600 MG tablet 1 pill in the am, on mid-day/early evening and 2 at bedtime Patient taking differently: Take 600-1,200 mg by mouth See admin instructions. 1 pill in the am, on mid-day/early evening and 2 at bedtime 03/15/19   Fulp, Cammie, MD  glucose blood test strip Relion Testing Strips Use as instructed 08/18/17   Lockamy, Timothy, DO  HUMALOG KWIKPEN 100 UNIT/ML KwikPen Inject 0.1 mLs (10 Units total) into the skin 3 (three) times daily. 03/15/19   Fulp, Cammie, MD  ibuprofen (ADVIL,MOTRIN) 200 MG tablet Take 800 mg by mouth daily as needed for mild pain.     [provider]  Insulin Glargine (LANTUS SOLOSTAR) 100 UNIT/ML Solostar Pen Inject 25 Units into the skin 2 (two) times daily. Increase dose by 1Unit each day your BG is over 150 in the morning. 03/15/19   Fulp, Cammie, MD  Insulin Pen Needle (PEN NEEDLES) 31G X 8 MM MISC 1 Units by Does not apply route daily. 08/20/17   Nuala Alpha, DO    Allergies    Tramadol  Review of Systems   Review of Systems  Constitutional: Positive for appetite change and fatigue. Negative for chills and fever.  HENT: Positive for congestion and rhinorrhea.   Respiratory: Negative for cough and shortness of breath.   Cardiovascular: Negative for chest pain and palpitations.  Gastrointestinal: Negative for abdominal pain and vomiting.  Musculoskeletal: Negative for arthralgias and back pain.  Skin: Negative for pallor and rash.  Neurological: Positive for weakness and light-headedness. Negative for seizures, syncope, speech difficulty, numbness and headaches.   Psychiatric/Behavioral: Negative for agitation and confusion.  All other systems reviewed and are negative.   Physical Exam Updated Vital Signs BP (!) 148/73   Pulse (!) 116   Temp 98.4 F (36.9 C) (Oral)   Resp 17   Ht 5\' 6"  (1.676 m)   Wt 60.8 kg   SpO2 100%   BMI 21.63 kg/m   Physical Exam Vitals and nursing note reviewed.  Constitutional:      General: She is not in acute distress.    Appearance: She is well-developed.  HENT:     Head:  Normocephalic and atraumatic.  Eyes:     Conjunctiva/sclera: Conjunctivae normal.  Cardiovascular:     Rate and Rhythm: Regular rhythm. Tachycardia present.     Pulses: Normal pulses.  Pulmonary:     Effort: Pulmonary effort is normal. No respiratory distress.     Breath sounds: Normal breath sounds.     Comments: 100% on room air Abdominal:     Palpations: Abdomen is soft.     Tenderness: There is no abdominal tenderness.  Musculoskeletal:     Cervical back: Neck supple.  Skin:    General: Skin is warm and dry.  Neurological:     General: No focal deficit present.     Mental Status: She is alert and oriented to person, place, and time.     GCS: GCS eye subscore is 4. GCS verbal subscore is 5. GCS motor subscore is 6.     Cranial Nerves: Cranial nerves are intact.     Sensory: Sensation is intact.     Motor: Motor function is intact.     Coordination: Coordination is intact.  Psychiatric:        Mood and Affect: Mood normal.        Behavior: Behavior normal.     ED Results / Procedures / Treatments   Labs (all labs ordered are listed, but only abnormal results are displayed) Labs Reviewed  CBC WITH DIFFERENTIAL/PLATELET - Abnormal; Notable for the following components:      Result Value   WBC 11.7 (*)    Neutro Abs 9.4 (*)    All other components within normal limits  CBG MONITORING, ED - Abnormal; Notable for the following components:   Glucose-Capillary 214 (*)    All other components within normal limits   RESPIRATORY PANEL BY RT PCR (FLU A&B, COVID)  BASIC METABOLIC PANEL  D-DIMER, QUANTITATIVE (NOT AT [facility])  RAPID URINE DRUG SCREEN, HOSP PERFORMED  URINALYSIS, ROUTINE W REFLEX MICROSCOPIC  CBG MONITORING, ED  POC SARS CORONAVIRUS 2 AG -  ED  TROPONIN I (HIGH SENSITIVITY)    EKG EKG Interpretation  Date/Time:  Friday June 30 2019 16:47:15 EST Ventricular Rate:  116 PR Interval:    QRS Duration: 79 QT Interval:  330 QTC Calculation: 459 R Axis:   84 Text Interpretation: Sinus tachycardia Atrial premature complex Baseline wander in lead(s) V3 No STEMI Confirmed by Octaviano Glow [phone]) on 06/30/2019 5:09:02 PM   Radiology DG Chest Port 1 View  Result Date: 06/30/2019 CLINICAL DATA:  Pain EXAM: PORTABLE CHEST 1 VIEW COMPARISON:  Radiograph 01/22/2014 FINDINGS: No consolidation, features of edema, pneumothorax, or effusion. Pulmonary vascularity is normally distributed. The cardiomediastinal contours are unremarkable. No acute osseous or soft tissue abnormality. IMPRESSION: No acute cardiopulmonary abnormality. Electronically Signed   By: Lovena Le M.D.   On: 06/30/2019 18:41    Procedures Procedures (including critical care time)  Medications Ordered in ED Medications  sodium chloride 0.9 % bolus 1,000 mL (0 mLs Intravenous Stopped 06/30/19 1948)    ED Course  I have reviewed the triage vital signs and the nursing notes.  Pertinent labs & imaging results that were available during my care of the patient were reviewed by me and considered in my medical decision making (see chart for details).  This is a 58 year old female presenting to the emergency department with a syncopal episode that occurred this afternoon while try to get up from the kitchen table.  She describes a prodrome for approximately an hour preceding this, she had  an out of body sensation and began to feel lightheaded like her feet were on clouds.  Describes feeling very weak.  It is possible this is  a sudden onset of a viral syndrome versus Covid.  We will test her for that rapidly.  She is also tachycardic here.  Is also possible this is a blood clot.  We will obtain a D-dimer and possible CT PE if positive.  Doubt this was related to hypoglycemia with BS in the 80's.    Trop also pending Orthostatic vital signs ordered with her hx of hypotension  Kaylenn Pittinger was evaluated in Emergency Department on 06/30/2019 for the symptoms described in the history of present illness. She was evaluated in the context of the global COVID-19 pandemic, which necessitated consideration that the patient might be at risk for infection with the SARS-CoV-2 virus that causes COVID-19. Institutional protocols and algorithms that pertain to the evaluation of patients at risk for COVID-19 are in a state of rapid change based on information released by regulatory bodies including the CDC and federal and state organizations. These policies and algorithms were followed during the patient's care in the ED.  This note was dictated using dragon dictation software.  Please be aware that there may be minor translation errors as a result of this oral dictation   Clinical Course as of Jun 30 2123  Fri Jun 30, 2019  1817 D-Dimer, Quant: <0.27 [MT]    Clinical Course User Index [MT] Wyvonnia Dusky, MD    Final Clinical Impression(s) / ED Diagnoses Final diagnoses:  Syncope, unspecified syncope type    Rx / DC Orders ED Discharge Orders    None       Wyvonnia Dusky, MD 06/30/19 2124

## 2019-06-30 NOTE — H&P (Signed)
History and Physical   [name] [name] [id number] DOB: 04-14-1961 DOA: 06/30/2019  Referring MD/NP/PA: Dr Langston Masker  PCP: Antony Blackbird, MD   Outpatient Specialists: Dr. Donnella Bi cardiology  Patient coming from: Home  Chief Complaint: Syncope  HPI: [name] [name] is a 58 y.o. female with medical history significant of neurogenic orthostatic hypotension, diabetes, hypertension, cervical radiculopathy, chronic pain syndrome, GERD, depression and medication noncompliance presenting with episode of passing out.  Patient reported driving home in her truck today when she felt lightheaded for ED and generally unwell.  She managed to reach home but when she is inside the home she felt weak and passed out.  She was out for a few minutes.  When she came to she came to the ER where she was seen and evaluated.  She is back to baseline now.  She has no other complaints.  Decreased severity of her symptoms however patient is being admitted to the hospital for continued work-up.  She has been seen by cardiology and scheduled for echocardiogram later this month.  Patient has persistent tachycardia now in the ER.  No chest pain.  She has not had hypotension that was thought to be neurogenic..  ED Course: Temperature is 98.4 blood pressure 103/60 pulse 114 respirate of 17 oxygen sat 95% room air.  White count 11.7 the rest of chemistry and CBC appear to be within normal except for potassium of 3.3, urine drug screen is negative.  Initial Covid 19 screen is negative.  Head CT without contrast is also negative.  Patient is being admitted for work-up of syncope.  Review of Systems: As per HPI otherwise 10 point review of systems negative.    Past Medical History:  Diagnosis Date  . Cervical disc disorder with radiculopathy of cervical region   . Chest pain   . Chronic low back pain    HNP  . Chronic pain syndrome   . Depression dx 1997  . Depression   . Diabetes mellitus, type 2 ([place]) 2011   No insulin  .  Diabetic polyneuropathy ([place])   . GERD (gastroesophageal reflux disease)   . Glaucoma   . Headache(784.0)   . Herniated disc   . Hypertension    Lab 11/2011:  CXR, EKG, CBC, TSH, BMet, troponin-normal; lipid profile: 188, 131, 36, 126   . Non-compliance   . Pancreatic cyst    Endoscopic aspiration in 09/2009  . Pneumonia 08/02/2012  . Shingles   . Vertigo     Past Surgical History:  Procedure Laterality Date  . ABDOMINAL HYSTERECTOMY    . CESAREAN SECTION       reports that she quit smoking about 40 years ago. She has never used smokeless tobacco. She reports that she does not drink alcohol or use drugs.  Allergies  Allergen Reactions  . Tramadol Nausea And Vomiting    REACTION: Projectile vomiting    Family History  Problem Relation Age of Onset  . Depression Mother        type 1  . Diabetes Mother   . Renal Disease Mother   . Lung cancer Father      Prior to Admission medications   Medication Sig Start Date End Date Taking? Authorizing Provider  acetaminophen (TYLENOL) 325 MG tablet Take 2 tablets (650 mg total) by mouth every 6 (six) hours as needed. 08/18/17   Nuala Alpha, DO  DULoxetine (CYMBALTA) 60 MG capsule Take 1 capsule (60 mg total) by mouth 2 (two) times daily. 06/28/19  Fulp, Cammie, MD  fludrocortisone (FLORINEF) 0.1 MG tablet Take 1 tablet (0.1 mg total) by mouth daily. 06/29/19   Lorretta Harp, MD  gabapentin (NEURONTIN) 600 MG tablet 1 pill in the am, on mid-day/early evening and 2 at bedtime Patient taking differently: Take 600-1,200 mg by mouth See admin instructions. 1 pill in the am, on mid-day/early evening and 2 at bedtime 03/15/19   Fulp, Cammie, MD  glucose blood test strip [employer] Testing Strips Use as instructed 08/18/17   Lockamy, Timothy, DO  HUMALOG KWIKPEN 100 UNIT/ML KwikPen Inject 0.1 mLs (10 Units total) into the skin 3 (three) times daily. 03/15/19   Fulp, Cammie, MD  ibuprofen (ADVIL,MOTRIN) 200 MG tablet Take 800 mg by mouth  daily as needed for mild pain.     [provider]  Insulin Glargine (LANTUS SOLOSTAR) 100 UNIT/ML Solostar Pen Inject 25 Units into the skin 2 (two) times daily. Increase dose by 1Unit each day your BG is over 150 in the morning. 03/15/19   Fulp, Cammie, MD  Insulin Pen Needle (PEN NEEDLES) 31G X 8 MM MISC 1 Units by Does not apply route daily. 08/20/17   Nuala Alpha, DO    Physical Exam: Vitals:   06/30/19 1900 06/30/19 1930 06/30/19 2030 06/30/19 2100  BP: (!) 145/72 (!) 151/78 118/81 (!) 148/73  Pulse: (!) 117 (!) 117 (!) 119 (!) 116  Resp: 15 16  17   Temp:      TempSrc:      SpO2: 99% 99% 95% 100%  Weight:      Height:          Constitutional: NAD, calm, comfortable Vitals:   06/30/19 1900 06/30/19 1930 06/30/19 2030 06/30/19 2100  BP: (!) 145/72 (!) 151/78 118/81 (!) 148/73  Pulse: (!) 117 (!) 117 (!) 119 (!) 116  Resp: 15 16  17   Temp:      TempSrc:      SpO2: 99% 99% 95% 100%  Weight:      Height:       Eyes: PERRL, lids and conjunctivae normal ENMT: Mucous membranes are moist. Posterior pharynx clear of any exudate or lesions.Normal dentition.  Neck: normal, supple, no masses, no thyromegaly Respiratory: clear to auscultation bilaterally, no wheezing, no crackles. Normal respiratory effort. No accessory muscle use.  Cardiovascular: Sinus tachycardia,, no murmurs / rubs / gallops. No extremity edema. 2+ pedal pulses. No carotid bruits.  Abdomen: no tenderness, no masses palpated. No hepatosplenomegaly. Bowel sounds positive.  Musculoskeletal: no clubbing / cyanosis. No joint deformity upper and lower extremities. Good ROM, no contractures. Normal muscle tone.  Skin: no rashes, lesions, ulcers. No induration Neurologic: CN 2-12 grossly intact. Sensation intact, DTR normal. Strength 5/5 in all 4.  Psychiatric: Normal judgment and insight. Alert and oriented x 3. Normal mood.     Labs on Admission: I have personally reviewed following labs and imaging  studies  CBC: Recent Labs  Lab 06/30/19 1738  WBC 11.7*  NEUTROABS 9.4*  HGB 13.5  HCT 40.4  MCV 93.1  PLT [id number]   Basic Metabolic Panel: Recent Labs  Lab 06/28/19 1225 06/30/19 1738  NA 136 140  K 4.5 3.5  CL 99 105  CO2 24 25  GLUCOSE 386* 84  BUN 17 12  CREATININE 0.79 0.56  CALCIUM 9.8 9.3   GFR: Estimated Creatinine Clearance: 70.9 mL/min (by C-G formula based on SCr of 0.56 mg/dL). Liver Function Tests: Recent Labs  Lab 06/28/19 1225  AST 21  ALT  26  ALKPHOS 138*  BILITOT 0.3  PROT 6.9  ALBUMIN 3.9   No results for input(s): LIPASE, AMYLASE in the last 168 hours. No results for input(s): AMMONIA in the last 168 hours. Coagulation Profile: No results for input(s): INR, PROTIME in the last 168 hours. Cardiac Enzymes: No results for input(s): CKTOTAL, CKMB, CKMBINDEX, TROPONINI in the last 168 hours. BNP (last 3 results) No results for input(s): PROBNP in the last 8760 hours. HbA1C: Recent Labs    06/28/19 1139  HGBA1C 13.1*   CBG: Recent Labs  Lab 06/30/19 1641 06/30/19 1847  GLUCAP 79 214*   Lipid Profile: No results for input(s): CHOL, HDL, LDLCALC, TRIG, CHOLHDL, LDLDIRECT in the last 72 hours. Thyroid Function Tests: Recent Labs    06/28/19 1225  TSH 2.210  T4TOTAL 7.3   Anemia Panel: No results for input(s): VITAMINB12, FOLATE, FERRITIN, TIBC, IRON, RETICCTPCT in the last 72 hours. Urine analysis:    Component Value Date/Time   COLORURINE STRAW (A) 06/30/2019 2107   APPEARANCEUR CLEAR 06/30/2019 2107   APPEARANCEUR Clear 03/15/2019 1056   LABSPEC 1.005 06/30/2019 2107   PHURINE 9.0 (H) 06/30/2019 2107   GLUCOSEU 150 (A) 06/30/2019 2107   HGBUR NEGATIVE 06/30/2019 2107   BILIRUBINUR NEGATIVE 06/30/2019 2107   BILIRUBINUR Negative 03/15/2019 [place] 06/30/2019 2107   PROTEINUR NEGATIVE 06/30/2019 2107   UROBILINOGEN 0.2 06/27/2015 0954   UROBILINOGEN 0.2 11/12/2013 1755   NITRITE NEGATIVE 06/30/2019 2107    LEUKOCYTESUR NEGATIVE 06/30/2019 2107   Sepsis Labs: @LABRCNTIP (procalcitonin:4,lacticidven:4) )No results found for this or any previous visit (from the past 240 hour(s)).   Radiological Exams on Admission: DG Chest Port 1 View  Result Date: 06/30/2019 CLINICAL DATA:  Pain EXAM: PORTABLE CHEST 1 VIEW COMPARISON:  Radiograph 01/22/2014 FINDINGS: No consolidation, features of edema, pneumothorax, or effusion. Pulmonary vascularity is normally distributed. The cardiomediastinal contours are unremarkable. No acute osseous or soft tissue abnormality. IMPRESSION: No acute cardiopulmonary abnormality. Electronically Signed   By: Lovena Le M.D.   On: 06/30/2019 18:41    EKG: Independently reviewed.  It shows sinus tachycardia with a rate of 120, no significant ST changes.  Assessment/Plan Principal Problem:   Syncope and collapse Active Problems:   Diabetes type 2, uncontrolled (HCC)   Depression   Chronic pain syndrome   Diabetic polyneuropathy associated with type 2 diabetes mellitus ([place])     #1 syncope: Probably due to neurogenic orthostasis.  Patient is having significant palpitations.  May be cardiac mediated with arrhythmias.  She is being seen by cardiology.  We will admit the patient and hydrate her.  Get echocardiogram.  PT evaluation in the morning.  Refer back to cardiology for continued care afterwards.  #2 diabetes: Initiate sliding scale insulin while continue home regimen.  #3 sinus tachycardia: May be related to her syncope.  Refer to #1 above.  #4 chronic pain syndrome: Urine drug screen showed no drugs.  Continue home regimen and monitor.   DVT prophylaxis: Lovenox Code Status: Full code Family Communication: Discussed care with patient fully Disposition Plan: Home Consults called: None Admission status: Observation  Severity of Illness: The appropriate patient status for this patient is OBSERVATION. Observation status is judged to be reasonable and necessary  in order to provide the required intensity of service to ensure the patient's safety. The patient's presenting symptoms, physical exam findings, and initial radiographic and laboratory data in the context of their medical condition is felt to place them at decreased risk  for further clinical deterioration. Furthermore, it is anticipated that the patient will be medically stable for discharge from the hospital within 2 midnights of admission. The following factors support the patient status of observation.   " The patient's presenting symptoms include syncope. " The physical exam findings include no orthostasis. " The initial radiographic and laboratory data are stable.     Barbette Merino MD [facility] Pager [phone]  If 7PM-7AM, please contact night-coverage [email or web address] Password [facility]  06/30/2019, 9:31 PM

## 2019-07-01 ENCOUNTER — Encounter (HOSPITAL_COMMUNITY): Payer: Self-pay | Admitting: Internal Medicine

## 2019-07-01 ENCOUNTER — Observation Stay (HOSPITAL_BASED_OUTPATIENT_CLINIC_OR_DEPARTMENT_OTHER): Payer: Self-pay

## 2019-07-01 DIAGNOSIS — R55 Syncope and collapse: Secondary | ICD-10-CM

## 2019-07-01 LAB — COMPREHENSIVE METABOLIC PANEL
ALT: 26 U/L (ref 0–44)
AST: 23 U/L (ref 15–41)
Albumin: 2.9 g/dL — ABNORMAL LOW (ref 3.5–5.0)
Alkaline Phosphatase: 93 U/L (ref 38–126)
Anion gap: 7 (ref 5–15)
BUN: 7 mg/dL (ref 6–20)
CO2: 25 mmol/L (ref 22–32)
Calcium: 8.5 mg/dL — ABNORMAL LOW (ref 8.9–10.3)
Chloride: 107 mmol/L (ref 98–111)
Creatinine, Ser: 0.55 mg/dL (ref 0.44–1.00)
GFR calc Af Amer: >60 mL/min (ref >60)
GFR calc non Af Amer: >60 mL/min (ref >60)
Glucose, Bld: 188 mg/dL — ABNORMAL HIGH (ref 70–99)
Potassium: 3.2 mmol/L — ABNORMAL LOW (ref 3.5–5.1)
Sodium: 139 mmol/L (ref 135–145)
Total Bilirubin: 0.8 mg/dL (ref 0.3–1.2)
Total Protein: 5.9 g/dL — ABNORMAL LOW (ref 6.5–8.1)

## 2019-07-01 LAB — CBC
HCT: 36.4 % (ref 36.0–46.0)
Hemoglobin: 12.3 g/dL (ref 12.0–15.0)
MCH: 31 pg (ref 26.0–34.0)
MCHC: 33.8 g/dL (ref 30.0–36.0)
MCV: 91.7 fL (ref 80.0–100.0)
Platelets: 197 10*3/uL (ref 150–400)
RBC: 3.97 MIL/uL (ref 3.87–5.11)
RDW: 11.8 % (ref 11.5–15.5)
WBC: 5.6 10*3/uL (ref 4.0–10.5)
nRBC: 0 % (ref 0.0–0.2)

## 2019-07-01 LAB — CORTISOL: Cortisol, Plasma: 6.9 ug/dL

## 2019-07-01 LAB — GLUCOSE, CAPILLARY
Glucose-Capillary: 164 mg/dL — ABNORMAL HIGH (ref 70–99)
Glucose-Capillary: 261 mg/dL — ABNORMAL HIGH (ref 70–99)
Glucose-Capillary: 269 mg/dL — ABNORMAL HIGH (ref 70–99)
Glucose-Capillary: 220 mg/dL — ABNORMAL HIGH (ref 70–99)

## 2019-07-01 LAB — ECHOCARDIOGRAM COMPLETE
Height: 66 in
Weight: 2363.33 oz

## 2019-07-01 LAB — HIV ANTIBODY (ROUTINE TESTING W REFLEX): HIV Screen 4th Generation wRfx: NONREACTIVE

## 2019-07-01 MED ORDER — POTASSIUM CHLORIDE CRYS ER 20 MEQ PO TBCR
40.0000 meq | EXTENDED_RELEASE_TABLET | Freq: Once | ORAL | Status: AC
  Administered 2019-07-01: 40 meq via ORAL
  Filled 2019-07-01: qty 2

## 2019-07-01 MED ORDER — SODIUM CHLORIDE 0.9 % IV SOLN: INTRAVENOUS | Status: DC

## 2019-07-01 MED ORDER — SODIUM CHLORIDE 0.9 % IV SOLN: INTRAVENOUS | Status: AC

## 2019-07-01 MED ORDER — GABAPENTIN 400 MG PO CAPS
1200.0000 mg | ORAL_CAPSULE | Freq: Every evening | ORAL | Status: DC
  Administered 2019-07-01: 1200 mg via ORAL
  Filled 2019-07-01: qty 3

## 2019-07-01 MED ORDER — MIDODRINE HCL 5 MG PO TABS
5.0000 mg | ORAL_TABLET | Freq: Two times a day (BID) | ORAL | Status: DC
  Administered 2019-07-01 – 2019-07-02 (×2): 5 mg via ORAL
  Filled 2019-07-01 (×2): qty 1

## 2019-07-01 MED ORDER — INSULIN ASPART 100 UNIT/ML ~~LOC~~ SOLN
5.0000 [IU] | Freq: Three times a day (TID) | SUBCUTANEOUS | Status: DC
  Administered 2019-07-01 – 2019-07-02 (×2): 5 [IU] via SUBCUTANEOUS

## 2019-07-01 MED ORDER — INSULIN GLARGINE 100 UNIT/ML ~~LOC~~ SOLN
40.0000 [IU] | Freq: Every day | SUBCUTANEOUS | Status: DC
  Administered 2019-07-01 – 2019-07-02 (×2): 40 [IU] via SUBCUTANEOUS
  Filled 2019-07-01 (×2): qty 0.4

## 2019-07-01 MED ORDER — GABAPENTIN 300 MG PO CAPS
600.0000 mg | ORAL_CAPSULE | Freq: Once | ORAL | Status: AC
  Administered 2019-07-01: 600 mg via ORAL
  Filled 2019-07-01: qty 2

## 2019-07-01 MED ORDER — INSULIN ASPART 100 UNIT/ML ~~LOC~~ SOLN
4.0000 [IU] | Freq: Three times a day (TID) | SUBCUTANEOUS | Status: DC
  Administered 2019-07-01: 4 [IU] via SUBCUTANEOUS

## 2019-07-01 MED ORDER — GABAPENTIN 300 MG PO CAPS
600.0000 mg | ORAL_CAPSULE | Freq: Two times a day (BID) | ORAL | Status: DC
  Administered 2019-07-02 (×2): 600 mg via ORAL
  Filled 2019-07-01 (×2): qty 2

## 2019-07-01 NOTE — Progress Notes (Addendum)
   Vital Signs MEWS/VS Documentation       07/01/2019 0918 07/01/2019 0919 07/01/2019 0922 07/01/2019 1229   MEWS Score:  1  1  1  2    MEWS Score Color:  Nyoka Cowden  Green  Green  Yellow   Resp:  --  --  --  18   Pulse:  (!) 101  (!) 109  (!) 108  (!) 107   BP:  --  --  --  97/61   Temp:  --  --  --  98.5 F (36.9 C)   O2 Device:  --  --  --  Room Air        Patient had just ambulated from restroom when VS were obtained, with known + orthostatics. Once pt returned to resting position, her HR returned to normal limits.     Maud Deed Tobias-Diakun 07/01/2019,12:43 PM

## 2019-07-01 NOTE — Progress Notes (Signed)
PROGRESS NOTE    [name] [name]  [id number] DOB: 1960-08-05 DOA: 06/30/2019 PCP: Antony Blackbird, MD  Brief Narrative: 58 year old female with longstanding uncontrolled diabetes, cervical radiculopathy, chronic pain syndrome, GERD, depression, has been having problems with hypotension for about 6-8 months.  She was recently referred to Dr. Quay Burow for evaluation of this. -Was brought to the ED after a syncopal event yesterday -In the emergency room orthostatic vital signs were positive with approximately 30 mmHg drop in systolic blood pressure   Assessment & Plan:   Syncope, orthostatic hypotension -Patient has been symptomatic from this for the last 6 to 8 months -Suspect this is secondary to autonomic neuropathy from longstanding uncontrolled diabetes mellitus, no history of alcohol abuse, -Hydrated with normal saline, blood pressure dropped by over 30 mmHg on standing today and patient became acutely symptomatic -Was recently started on Florinef last week, had not started this at home, will start this -Also add midodrine 5 mg twice daily -Continue gentle IV fluids today -Place TED hose -Liberalize salt intake -Need to assess response to therapy -Echocardiogram done today was unremarkable  Uncontrolled type 2 diabetes mellitus with hyperglycemia -Patient reports that her last hemoglobin A1c was 15 -Admits to compliance with medications however is not compliant with diet -Increase Lantus, add NovoLog with meals -Recheck A1c  Diabetic peripheral neuropathy -Continue gabapentin  Depression -Continue Cymbalta  Hypokalemia -Replace  DVT prophylaxis: Lovenox Code Status: Full code Family Communication: No family at bedside Disposition Plan: Home pending improvement in clinical symptoms, patient is still very lightheaded upon standing up and with mobility, monitor response to therapy with Florinef and midodrine today   Procedures:   Antimicrobials:     Subjective: -Reports ongoing lightheadedness for 6 to 8 months especially with position change -Had a syncopal event yesterday and was too weak to stand up  Objective: Vitals:   07/01/19 0919 07/01/19 0922 07/01/19 1229 07/01/19 1243  BP:   97/61   Pulse: (!) 109 (!) 108 (!) 107 95  Resp:   18   Temp:   98.5 F (36.9 C)   TempSrc:   Oral   SpO2:   97%   Weight:      Height:        Intake/Output Summary (Last 24 hours) at 07/01/2019 1400 Last data filed at 07/01/2019 [id number] Gross per 24 hour  Intake 1531.94 ml  Output --  Net 1531.94 ml   Filed Weights   06/30/19 1641 07/01/19 0617  Weight: 60.8 kg 67 kg    Examination:  General exam: Thinly built female AAOx3, no distress Respiratory system: Clear to auscultation.  Cardiovascular system: S1 & S2 heard, RRR. No JVD, murmurs, rubs, gallops Gastrointestinal system: Abdomen is nondistended, soft and nontender.Normal bowel sounds heard. Central nervous system: Alert and oriented. No focal neurological deficits. Extremities: No edema, severe peripheral neuropathy noted Skin: No rashes, lesions or ulcers Psychiatry: Flat affect   Data Reviewed:   CBC: Recent Labs  Lab 06/30/19 1738 07/01/19 0515  WBC 11.7* 5.6  NEUTROABS 9.4*  --   HGB 13.5 12.3  HCT 40.4 36.4  MCV 93.1 91.7  PLT 250 [id number]   Basic Metabolic Panel: Recent Labs  Lab 06/28/19 1225 06/30/19 1738 07/01/19 0515  NA 136 140 139  K 4.5 3.5 3.2*  CL 99 105 107  CO2 24 25 25   GLUCOSE 386* 84 188*  BUN 17 12 7   CREATININE 0.79 0.56 0.55  CALCIUM 9.8 9.3 8.5*   GFR: Estimated Creatinine Clearance: 70.9  mL/min (by C-G formula based on SCr of 0.55 mg/dL). Liver Function Tests: Recent Labs  Lab 06/28/19 1225 07/01/19 0515  AST 21 23  ALT 26 26  ALKPHOS 138* 93  BILITOT 0.3 0.8  PROT 6.9 5.9*  ALBUMIN 3.9 2.9*   No results for input(s): LIPASE, AMYLASE in the last 168 hours. No results for input(s): AMMONIA in the last 168  hours. Coagulation Profile: No results for input(s): INR, PROTIME in the last 168 hours. Cardiac Enzymes: No results for input(s): CKTOTAL, CKMB, CKMBINDEX, TROPONINI in the last 168 hours. BNP (last 3 results) No results for input(s): PROBNP in the last 8760 hours. HbA1C: No results for input(s): HGBA1C in the last 72 hours. CBG: Recent Labs  Lab 06/30/19 1641 06/30/19 1847 06/30/19 2258 07/01/19 0645 07/01/19 1120  GLUCAP 79 214* 308* 164* 261*   Lipid Profile: No results for input(s): CHOL, HDL, LDLCALC, TRIG, CHOLHDL, LDLDIRECT in the last 72 hours. Thyroid Function Tests: No results for input(s): TSH, T4TOTAL, FREET4, T3FREE, THYROIDAB in the last 72 hours. Anemia Panel: No results for input(s): VITAMINB12, FOLATE, FERRITIN, TIBC, IRON, RETICCTPCT in the last 72 hours. Urine analysis:    Component Value Date/Time   COLORURINE STRAW (A) 06/30/2019 2107   APPEARANCEUR CLEAR 06/30/2019 2107   APPEARANCEUR Clear 03/15/2019 1056   LABSPEC 1.005 06/30/2019 2107   PHURINE 9.0 (H) 06/30/2019 2107   GLUCOSEU 150 (A) 06/30/2019 2107   HGBUR NEGATIVE 06/30/2019 2107   BILIRUBINUR NEGATIVE 06/30/2019 2107   BILIRUBINUR Negative 03/15/2019 [place] 06/30/2019 2107   PROTEINUR NEGATIVE 06/30/2019 2107   UROBILINOGEN 0.2 06/27/2015 0954   UROBILINOGEN 0.2 11/12/2013 1755   NITRITE NEGATIVE 06/30/2019 2107   LEUKOCYTESUR NEGATIVE 06/30/2019 2107   Sepsis Labs: @LABRCNTIP (procalcitonin:4,lacticidven:4)  ) Recent Results (from the past 240 hour(s))  Respiratory Panel by RT PCR (Flu A&B, Covid) - Nasopharyngeal Swab     Status: None   Collection Time: 06/30/19  8:29 PM   Specimen: Nasopharyngeal Swab  Result Value Ref Range Status   SARS Coronavirus 2 by RT PCR NEGATIVE NEGATIVE Final    Comment: (NOTE) SARS-CoV-2 target nucleic acids are NOT DETECTED. The SARS-CoV-2 RNA is generally detectable in upper respiratoy specimens during the acute phase of  infection. The lowest concentration of SARS-CoV-2 viral copies this assay can detect is 131 copies/mL. A negative result does not preclude SARS-Cov-2 infection and should not be used as the sole basis for treatment or other patient management decisions. A negative result may occur with  improper specimen collection/handling, submission of specimen other than nasopharyngeal swab, presence of viral mutation(s) within the areas targeted by this assay, and inadequate number of viral copies (<131 copies/mL). A negative result must be combined with clinical observations, patient history, and epidemiological information. The expected result is Negative. Fact Sheet for Patients:  PinkCheek.be Fact Sheet for Healthcare Providers:  GravelBags.it This test is not yet ap proved or cleared by the [place] FDA and  has been authorized for detection and/or diagnosis of SARS-CoV-2 by FDA under an Emergency Use Authorization (EUA). This EUA will remain  in effect (meaning this test can be used) for the duration of the COVID-19 declaration under Section 564(b)(1) of the Act, 21 U.S.C. section 360bbb-3(b)(1), unless the authorization is terminated or revoked sooner.    Influenza A by PCR NEGATIVE NEGATIVE Final   Influenza B by PCR NEGATIVE NEGATIVE Final    Comment: (NOTE) The Xpert Xpress SARS-CoV-2/FLU/RSV assay is intended as an aid  in  the diagnosis of influenza from Nasopharyngeal swab specimens and  should not be used as a sole basis for treatment. Nasal washings and  aspirates are unacceptable for Xpert Xpress SARS-CoV-2/FLU/RSV  testing. Fact Sheet for Patients: PinkCheek.be Fact Sheet for Healthcare Providers: GravelBags.it This test is not yet approved or cleared by the [place] FDA and  has been authorized for detection and/or diagnosis of SARS-CoV-2 by  FDA under  an Emergency Use Authorization (EUA). This EUA will remain  in effect (meaning this test can be used) for the duration of the  Covid-19 declaration under Section 564(b)(1) of the Act, 21  U.S.C. section 360bbb-3(b)(1), unless the authorization is  terminated or revoked. Performed at [facility], [place] [address] [place], [place] [address]          Radiology Studies: DG Chest Port 1 View  Result Date: 06/30/2019 CLINICAL DATA:  Pain EXAM: PORTABLE CHEST 1 VIEW COMPARISON:  Radiograph 01/22/2014 FINDINGS: No consolidation, features of edema, pneumothorax, or effusion. Pulmonary vascularity is normally distributed. The cardiomediastinal contours are unremarkable. No acute osseous or soft tissue abnormality. IMPRESSION: No acute cardiopulmonary abnormality. Electronically Signed   By: Lovena Le M.D.   On: 06/30/2019 18:41   ECHOCARDIOGRAM COMPLETE  Result Date: 07/01/2019   ECHOCARDIOGRAM REPORT   Patient Name:   [name] [name] Date of Exam: 07/01/2019 Medical Rec #:  [id number]   Height:       66.0 in Accession #:    [id number]  Weight:       147.7 lb Date of Birth:  10-Jan-1961   BSA:          1.76 m Patient Age:    46 years    BP:           109/66 mmHg Patient Gender: F           HR:           89 bpm. Exam Location:  Inpatient Procedure: 2D Echo Indications:    Syncope 780.2/R55  History:        Patient has no prior history of Echocardiogram examinations.                 Risk Factors:Hypertension and Diabetes.  Sonographer:    Clayton Lefort RDCS (AE) Referring Phys: [place]  1. Left ventricular ejection fraction, by visual estimation, is 60 to 65%. The left ventricle has normal function. There is no left ventricular hypertrophy.  2. Indeterminate diastolic filling due to E-A fusion.  3. The left ventricle has no regional wall motion abnormalities.  4. Global right ventricle has normal systolic function.The right ventricular size is normal. No increase in right  ventricular wall thickness.  5. Left atrial size was normal.  6. Right atrial size was normal.  7. The pericardial effusion is circumferential.  8. Trivial pericardial effusion is present.  9. The mitral valve is normal in structure. No evidence of mitral valve regurgitation. No evidence of mitral stenosis. 10. The tricuspid valve is normal in structure. Tricuspid valve regurgitation is not demonstrated. 11. The aortic valve is tricuspid. Aortic valve regurgitation is not visualized. No evidence of aortic valve sclerosis or stenosis. 12. The pulmonic valve was not well visualized. Pulmonic valve regurgitation is not visualized. FINDINGS  Left Ventricle: Left ventricular ejection fraction, by visual estimation, is 60 to 65%. The left ventricle has normal function. The left ventricle has no regional wall motion abnormalities. There is no left ventricular hypertrophy.  Indeterminate diastolic filling due to E-A fusion. Right Ventricle: The right ventricular size is normal. No increase in right ventricular wall thickness. Global RV systolic function is has normal systolic function. Left Atrium: Left atrial size was normal in size. Right Atrium: Right atrial size was normal in size Pericardium: Trivial pericardial effusion is present. The pericardial effusion is circumferential. Mitral Valve: The mitral valve is normal in structure. No evidence of mitral valve regurgitation. No evidence of mitral valve stenosis by observation. MV peak gradient, 6.9 mmHg. Tricuspid Valve: The tricuspid valve is normal in structure. Tricuspid valve regurgitation is not demonstrated. Aortic Valve: The aortic valve is tricuspid. Aortic valve regurgitation is not visualized. The aortic valve is structurally normal, with no evidence of sclerosis or stenosis. Aortic valve mean gradient measures 5.0 mmHg. Aortic valve peak gradient measures 8.1 mmHg. Aortic valve area, by VTI measures 2.18 cm. Pulmonic Valve: The pulmonic valve was not well  visualized. Pulmonic valve regurgitation is not visualized. Pulmonic regurgitation is not visualized. No evidence of pulmonic stenosis. Aorta: The aortic root is normal in size and structure. Pulmonary Artery: Indeterminate PASP, inadequate TR jet. IAS/Shunts: No atrial level shunt detected by color flow Doppler.  LEFT VENTRICLE PLAX 2D LVIDd:         3.50 cm LVIDs:         2.60 cm LV PW:         1.00 cm LV IVS:        1.00 cm LVOT diam:     1.80 cm LV SV:         26 ml LV SV Index:   14.81 LVOT Area:     2.54 cm  RIGHT VENTRICLE             IVC RV Basal diam:  2.60 cm     IVC diam: 1.80 cm RV S prime:     14.80 cm/s TAPSE (M-mode): 2.2 cm LEFT ATRIUM             Index       RIGHT ATRIUM          Index LA diam:        2.70 cm 1.54 cm/m  RA Area:     9.72 cm LA Vol (A2C):   27.4 ml 15.58 ml/m RA Volume:   18.50 ml 10.52 ml/m LA Vol (A4C):   20.3 ml 11.55 ml/m LA Biplane Vol: 23.7 ml 13.48 ml/m  AORTIC VALVE AV Area (Vmax):    2.10 cm AV Area (Vmean):   1.95 cm AV Area (VTI):     2.18 cm AV Vmax:           142.00 cm/s AV Vmean:          104.000 cm/s AV VTI:            0.246 m AV Peak Grad:      8.1 mmHg AV Mean Grad:      5.0 mmHg LVOT Vmax:         117.00 cm/s LVOT Vmean:        79.800 cm/s LVOT VTI:          0.211 m LVOT/AV VTI ratio: 0.86  AORTA Ao Root diam: 2.50 cm Ao Asc diam:  2.90 cm MITRAL VALVE MV Area (PHT): 4.58 cm   SHUNTS MV Peak grad:  6.9 mmHg   Systemic VTI:  0.21 m MV Mean grad:  3.0 mmHg   Systemic Diam: 1.80 cm MV Vmax:  1.31 m/s MV Vmean:      78.2 cm/s MV VTI:        0.31 m MV PHT:        48.00 msec  Carlyle Dolly MD Electronically signed by Carlyle Dolly MD Signature Date/Time: 07/01/2019/11:47:11 AM    Final         Scheduled Meds: . DULoxetine  60 mg Oral BID  . enoxaparin (LOVENOX) injection  40 mg Subcutaneous Q24H  . fludrocortisone  0.1 mg Oral Daily  . gabapentin  1,200 mg Oral QPM  . gabapentin  600 mg Oral BID  . insulin aspart  0-15 Units Subcutaneous  TID WC  . insulin aspart  0-5 Units Subcutaneous QHS  . insulin aspart  4 Units Subcutaneous TID WC  . insulin glargine  40 Units Subcutaneous Daily  . midodrine  5 mg Oral BID WC  . sodium chloride flush  3 mL Intravenous Q12H   Continuous Infusions: . sodium chloride 75 mL/hr at 07/01/19 0754     LOS: 0 days    Time spent: 18min    Domenic Polite, MD [facility]   07/01/2019, 2:00 PM

## 2019-07-01 NOTE — Evaluation (Signed)
Physical Therapy Evaluation Patient Details Name: [name] [name] MRN: [id number] DOB: [birth year] Today's Date: 07/01/2019   History of Present Illness  58 yo female with onset of fall and syncopal episode was admitted, has noted hypotension with standing.  Has history of neurogenic origin of hypotension, tachycardia, and did not have light headed feelings with low BP at eval.  PMHx:  vertigo, polyneuropathy, cervical spine radiculopathy, LBP with hx HNP, chest pain, DM, HA, PNA, shingles  Clinical Impression  Pt was seen for mobility with ck of her BP as follows:  Supine 120/75, pulse 88;  Sitting 117/80, pulse 92;  Standing 87/58 pulse 92.  Reported to nursing and talked with pt about whether she would consider rehab.  Given LE weakness and her BP drop, would recommend consideration for CIR.  However, if pt can get BP controlled could go home with granddaughters without as much concern.  Follow acutely to try stairs for entrance of home when ready to go.      Follow Up Recommendations CIR    Equipment Recommendations  Other (comment)(If pt goes home will need RW)    Recommendations for Other Services Rehab consult     Precautions / Restrictions Precautions Precautions: Fall Precaution Comments: ck standing BP      Mobility  Bed Mobility Overal bed mobility: Needs Assistance Bed Mobility: Supine to Sit;Sit to Supine     Supine to sit: Min guard;Min assist Sit to supine: Min guard;Min assist   General bed mobility comments: pt requires bed rail and extra time  Transfers Overall transfer level: Needs assistance Equipment used: Rolling walker (2 wheeled);1 person hand held assist Transfers: Sit to/from Stand Sit to Stand: Min guard;Min assist         General transfer comment: min assist to power up and min guard to steady initially   Ambulation/Gait Ambulation/Gait assistance: Min guard Gait Distance (Feet): 8 Feet Assistive device: Rolling walker (2 wheeled);1 person  hand held assist Gait Pattern/deviations: Step-to pattern;Decreased stride length;Wide base of support Gait velocity: reduced   General Gait Details: sidesteps to see supervision level side of bed but BP quite low  Stairs            Wheelchair Mobility    Modified Rankin (Stroke Patients Only)       Balance Overall balance assessment: History of Falls;Needs assistance Sitting-balance support: Feet supported Sitting balance-Leahy Scale: Fair   Postural control: Posterior lean Standing balance support: Bilateral upper extremity supported;During functional activity Standing balance-Leahy Scale: Fair Standing balance comment: any dynamic challenge is less steady                             Pertinent Vitals/Pain Pain Assessment: No/denies pain    Home Living Family/patient expects to be discharged to:: Private residence Living Arrangements: Children;Other relatives Available Help at Discharge: Family;Available 24 hours/day Type of Home: House Home Access: Stairs to enter Entrance Stairs-Rails: Right;Left;Can reach both Entrance Stairs-Number of Steps: 3 Home Layout: One level Home Equipment: Walker - 4 wheels Additional Comments: pt states she has fallen many times but does not need an AD    Prior Function Level of Independence: Independent;Needs assistance(home with daughter's family, declines AD despite falls)   Gait / Transfers Assistance Needed: I gait with no AD but reports falls  ADL's / Homemaking Assistance Needed: has been able to do self care alone        Hand Dominance   Dominant Hand: Right  Extremity/Trunk Assessment   Upper Extremity Assessment Upper Extremity Assessment: Overall WFL for tasks assessed    Lower Extremity Assessment Lower Extremity Assessment: Generalized weakness    Cervical / Trunk Assessment Cervical / Trunk Assessment: Kyphotic  Communication   Communication: No difficulties  Cognition  Arousal/Alertness: Awake/alert Behavior During Therapy: Flat affect Overall Cognitive Status: Within Functional Limits for tasks assessed                                 General Comments: quiet speech      General Comments General comments (skin integrity, edema, etc.): Pt strength is reduced on LE's and inconsistent    Exercises     Assessment/Plan    PT Assessment Patient needs continued PT services  PT Problem List Decreased strength;Decreased range of motion;Decreased activity tolerance;Decreased balance;Decreased mobility;Decreased cognition;Decreased coordination;Decreased knowledge of use of DME;Decreased safety awareness;Cardiopulmonary status limiting activity       PT Treatment Interventions DME instruction;Gait training;Stair training;Functional mobility training;Therapeutic activities;Therapeutic exercise;Balance training;Neuromuscular re-education;Patient/family education    PT Goals (Current goals can be found in the Care Plan section)  Acute Rehab PT Goals Patient Stated Goal: to feel better, get home PT Goal Formulation: With patient Time For Goal Achievement: 07/15/19 Potential to Achieve Goals: Good    Frequency Min 4X/week   Barriers to discharge Inaccessible home environment stairs to enter the house    Co-evaluation               AM-PAC PT "6 Clicks" Mobility  Outcome Measure Help needed turning from your back to your side while in a flat bed without using bedrails?: None Help needed moving from lying on your back to sitting on the side of a flat bed without using bedrails?: A Little Help needed moving to and from a bed to a chair (including a wheelchair)?: A Little Help needed standing up from a chair using your arms (e.g., wheelchair or bedside chair)?: A Little Help needed to walk in hospital room?: A Little Help needed climbing 3-5 steps with a railing? : A Lot 6 Click Score: 18    End of Session Equipment Utilized During  Treatment: Gait belt Activity Tolerance: Patient limited by fatigue;Treatment limited secondary to medical complications (Comment) Patient left: in bed;with call bell/phone within reach;with bed alarm set Nurse Communication: Mobility status;Other (comment)(BP readings) PT Visit Diagnosis: Muscle weakness (generalized) (M62.81);History of falling (Z91.81);Dizziness and giddiness (R42)    Time: [id number] PT Time Calculation (min) (ACUTE ONLY): 35 min   Charges:   PT Evaluation $PT Eval Moderate Complexity: 1 Mod PT Treatments $Therapeutic Exercise: 8-22 mins       Ramond Dial 07/01/2019, 12:17 PM   Mee Hives, PT MS Acute Rehab Dept. Number: [place] and [place]

## 2019-07-01 NOTE — Progress Notes (Signed)
   Vital Signs MEWS/VS Documentation       07/01/2019 0919 07/01/2019 0922 07/01/2019 1229 07/01/2019 1243   MEWS Score:  1  1  2  1    MEWS Score Color:  Green  Green  Yellow  Green   Resp:  --  --  18  --   Pulse:  (!) 109  (!) 108  (!) 107  95   BP:  --  --  97/61  --   Temp:  --  --  98.5 F (36.9 C)  --   O2 Device:  --  --  Room Air  --            Maud Deed Tobias-Diakun 07/01/2019,1:33 PM

## 2019-07-01 NOTE — Progress Notes (Signed)
Patient set up for bath at sink, ccmd notified of pt disconnected from tele for bathing.   Pt independently washing once set up at sink.

## 2019-07-01 NOTE — Progress Notes (Signed)
Patient resting comfortably during shift report. Assisted to restroom.

## 2019-07-01 NOTE — Progress Notes (Signed)
NT notified of Q2H VS until 10pm. Will report off to night shift.

## 2019-07-01 NOTE — Progress Notes (Signed)
Page to MD   5C-17, [name] [name]. Requesting IP orders for home gabapentin.

## 2019-07-01 NOTE — Progress Notes (Signed)
Insulin Dose, Lunch 07/01/2019  CBG: 261, however patient stated she had just eaten before lunch, and had nearly completed her lunch when her BS was checked.   pt was agreeable to taking just 3u, plus her 4u meal coverage. Totaling only 7 units of insulin, vs the SS suggested 8u + 4u, as the late CBG indicated.

## 2019-07-01 NOTE — Progress Notes (Signed)
  Echocardiogram 2D Echocardiogram has been performed.  [name] [name] 07/01/2019, 10:03 AM

## 2019-07-01 NOTE — Progress Notes (Addendum)
Pt has now returned to bed after bath. Reconnected to tele.  Pt denies symptoms/complaints. Pt reconnected to IVF/Restarted.  Pt has VS Q4H orders, will perform VS Q2H x2, then return to Q4H as ordered. Per mews protocol.    Vital Signs MEWS/VS Documentation      07/01/2019 1229 07/01/2019 1243 07/01/2019 1803 07/01/2019 1814   MEWS Score:  2  1  2  2    MEWS Score Color:  Yellow  Green  Yellow  Yellow   Resp:  18  -  16  -   Pulse:  (!) 107  95  (!) 125  (!) 112   BP:  97/61  -  126/75  -   Temp:  98.5 F (36.9 C)  -  99.3 F (37.4 C)  -   O2 Device:  Room [employer]  -  Room Air  -        Wellford N Tobias-Diakun,RN 07/01/2019,6:20 PM

## 2019-07-02 LAB — BASIC METABOLIC PANEL
Anion gap: 7 (ref 5–15)
BUN: 10 mg/dL (ref 6–20)
CO2: 24 mmol/L (ref 22–32)
Calcium: 8.5 mg/dL — ABNORMAL LOW (ref 8.9–10.3)
Chloride: 107 mmol/L (ref 98–111)
Creatinine, Ser: 0.57 mg/dL (ref 0.44–1.00)
GFR calc Af Amer: >60 mL/min (ref >60)
GFR calc non Af Amer: >60 mL/min (ref >60)
Glucose, Bld: 263 mg/dL — ABNORMAL HIGH (ref 70–99)
Potassium: 3.8 mmol/L (ref 3.5–5.1)
Sodium: 138 mmol/L (ref 135–145)

## 2019-07-02 LAB — CBC
HCT: 37 % (ref 36.0–46.0)
Hemoglobin: 12.3 g/dL (ref 12.0–15.0)
MCH: 30.8 pg (ref 26.0–34.0)
MCHC: 33.2 g/dL (ref 30.0–36.0)
MCV: 92.7 fL (ref 80.0–100.0)
Platelets: 192 10*3/uL (ref 150–400)
RBC: 3.99 MIL/uL (ref 3.87–5.11)
RDW: 11.9 % (ref 11.5–15.5)
WBC: 5 10*3/uL (ref 4.0–10.5)
nRBC: 0 % (ref 0.0–0.2)

## 2019-07-02 LAB — GLUCOSE, CAPILLARY
Glucose-Capillary: 229 mg/dL — ABNORMAL HIGH (ref 70–99)
Glucose-Capillary: 260 mg/dL — ABNORMAL HIGH (ref 70–99)

## 2019-07-02 MED ORDER — MIDODRINE HCL 5 MG PO TABS: 10.0000 mg | ORAL_TABLET | Freq: Two times a day (BID) | ORAL | Status: DC

## 2019-07-02 MED ORDER — ACETAMINOPHEN 500 MG PO TABS: 1000.0000 mg | ORAL_TABLET | Freq: Four times a day (QID) | ORAL | 0 refills | Status: DC | PRN

## 2019-07-02 MED ORDER — LANTUS SOLOSTAR 100 UNIT/ML ~~LOC~~ SOPN: 45.0000 [IU] | PEN_INJECTOR | Freq: Every day | SUBCUTANEOUS | 99 refills | Status: DC

## 2019-07-02 MED ORDER — MIDODRINE HCL 10 MG PO TABS: 10.0000 mg | ORAL_TABLET | Freq: Two times a day (BID) | ORAL | 0 refills | Status: DC

## 2019-07-02 NOTE — Plan of Care (Signed)
  Problem: Clinical Measurements: Goal: Diagnostic test results will improve Outcome: Completed/Met   Problem: Nutrition: Goal: Adequate nutrition will be maintained Outcome: Completed/Met   Problem: Coping: Goal: Level of anxiety will decrease Outcome: Completed/Met   Problem: Elimination: Goal: Will not experience complications related to bowel motility Outcome: Completed/Met Goal: Will not experience complications related to urinary retention Outcome: Completed/Met   Problem: Skin Integrity: Goal: Risk for impaired skin integrity will decrease Outcome: Completed/Met

## 2019-07-02 NOTE — Progress Notes (Signed)
Physical Therapy Treatment Patient Details Name: [name] [name] MRN: [id number] DOB: 09/27/1960 Today's Date: 07/02/2019    History of Present Illness 58 yo female with onset of fall and syncopal episode was admitted, has noted hypotension with standing.  Has history of neurogenic origin of hypotension, tachycardia, and did not have light headed feelings with low BP at eval.  PMHx:  vertigo, polyneuropathy, cervical spine radiculopathy, LBP with hx HNP, chest pain, DM, HA, PNA, shingles    PT Comments    Patient received in bed, agrees to PT session. Reports she is just feeling very weak. No dizziness reported this session. She performed bed mobility with supervision/min guard. Transitions to standing with min guard. Ambulated 200 feet, slow pace without assistive device. Slightly unsteady, reaching for rails in hallway, furniture in room for support. No overt LOB, but will benefit from continued skilled PT to improve LE strength for improved balance.  She has family members at home that can assist her or supervise mobility as needed. She will benefit from HHPT to improve strength, balance and safety once discharged.         Follow Up Recommendations  Home health PT;Supervision for mobility/OOB     Equipment Recommendations  None recommended by PT    Recommendations for Other Services       Precautions / Restrictions Precautions Precautions: Fall Restrictions Weight Bearing Restrictions: No    Mobility  Bed Mobility Overal bed mobility: Needs Assistance Bed Mobility: Supine to Sit;Sit to Supine     Supine to sit: Supervision Sit to supine: Supervision   General bed mobility comments: requires extra time  Transfers Overall transfer level: Needs assistance Equipment used: None Transfers: Sit to/from Stand Sit to Stand: Min guard            Ambulation/Gait Ambulation/Gait assistance: Supervision;Min guard Gait Distance (Feet): 200 Feet Assistive device: None Gait  Pattern/deviations: Step-through pattern;Drifts right/left Gait velocity: reduced   General Gait Details: unsteady with mobility, due to LE weakness, reaching for rails, furniture to steady self.   Stairs             Wheelchair Mobility    Modified Rankin (Stroke Patients Only)       Balance Overall balance assessment: Mild deficits observed, not formally tested;Needs assistance;History of Falls Sitting-balance support: Feet supported Sitting balance-Leahy Scale: Good     Standing balance support: Single extremity supported;During functional activity Standing balance-Leahy Scale: Fair Standing balance comment: unsteady due to weakness, improved some as gait distance increased. Reaching for furniture, rails in hallway for support                            Cognition Arousal/Alertness: Awake/alert Behavior During Therapy: [facility] for tasks assessed/performed Overall Cognitive Status: Within Functional Limits for tasks assessed                                        Exercises      General Comments        Pertinent Vitals/Pain Pain Assessment: No/denies pain    Home Living                      Prior Function            PT Goals (current goals can now be found in the care plan section) Acute Rehab PT Goals Patient  Stated Goal: to feel better, get home PT Goal Formulation: With patient Time For Goal Achievement: 07/15/19 Potential to Achieve Goals: Good Progress towards PT goals: Progressing toward goals    Frequency    Min 3X/week      PT Plan Discharge plan needs to be updated;Frequency needs to be updated    Co-evaluation              AM-PAC PT "6 Clicks" Mobility   Outcome Measure  Help needed turning from your back to your side while in a flat bed without using bedrails?: None Help needed moving from lying on your back to sitting on the side of a flat bed without using bedrails?: None Help needed  moving to and from a bed to a chair (including a wheelchair)?: A Little Help needed standing up from a chair using your arms (e.g., wheelchair or bedside chair)?: A Little Help needed to walk in hospital room?: A Little Help needed climbing 3-5 steps with a railing? : A Little 6 Click Score: 20    End of Session Equipment Utilized During Treatment: Gait belt Activity Tolerance: Patient limited by fatigue Patient left: in bed;with call bell/phone within reach;with bed alarm set Nurse Communication: Mobility status PT Visit Diagnosis: Muscle weakness (generalized) (M62.81);History of falling (Z91.81);Unsteadiness on feet (R26.81)     Time: 1040-1059 PT Time Calculation (min) (ACUTE ONLY): 19 min  Charges:  $Gait Training: 8-22 mins                     Ji Feldner, PT, GCS 07/02/19,1:15 PM

## 2019-07-02 NOTE — Care Management (Signed)
Discussed DC plan with patient. Patient wants to proceed with outpatient PT, referral placed to [facility]

## 2019-07-02 NOTE — Progress Notes (Signed)
Pt discharge education completed at bedside  Pt has all belongings  Pt IV removed, catheter intact  Pt telemetry removed, CCMD aware  Pt discharged via wheelchair

## 2019-07-03 NOTE — Discharge Summary (Signed)
Physician Discharge Summary  Vertis Valliant [id number] DOB: 07-Feb-1961 DOA: 06/30/2019  PCP: Antony Blackbird, MD  Admit date: 06/30/2019 Discharge date: 07/02/2019  Time spent: 35 minutes  Recommendations for Outpatient Follow-up:  PCP in 1 week, recently started on fludrocortisone and now on midodrine, monitor tolerance to these medicines, monitor for fluid retention Home health PT  Discharge Diagnoses:  Principal Problem:   Syncope and collapse Chronic orthostatic hypotension Autonomic neuropathy Uncontrolled type 2 diabetes mellitus Severe peripheral neuropathy   Depression   Chronic pain syndrome   Diabetic polyneuropathy associated with type 2 diabetes mellitus ([place])   Discharge Condition: Stable  Diet recommendation: Liberalize salt intake, caution with position changes  Filed Weights   06/30/19 1641 07/01/19 0617 07/02/19 0745  Weight: 60.8 kg 67 kg 65.4 kg    History of present illness:  58 year old female with longstanding uncontrolled diabetes, cervical radiculopathy, chronic pain syndrome, GERD, depression, has been having problems with hypotension for about 6-8 months.  She was recently referred to Dr. Quay Burow for evaluation of this. -Was brought to the ED after a syncopal event yesterday -In the emergency room orthostatic vital signs were positive with approximately 40 mmHg drop in systolic blood pressure upon standing   Hospital Course:   Syncope, orthostatic hypotension -Patient has been symptomatic from this for the last 6 to 8 months -Suspect this is secondary to autonomic neuropathy from longstanding uncontrolled diabetes mellitus, no history of alcohol abuse, hemoglobin A1c is 15 -Hydrated with normal saline, blood pressure dropped by over 40 mmHg on standing yesterday -Was recently started on Florinef last week by Dr. Gwenlyn Found in the office, had not started this at home, we started this inpatient, 2D echocardiogram was completed which showed  preserved EF, no significant valvular disease -We also started her on midodrine 10 mg twice daily -Hydrated initially, fluids discontinued yesterday -Also educated regarding need to liberalize salt intake, caution with position changes, wearing a TED hose/compression stockings -PT OT eval completed, home health PT was recommended and set up at discharge, advised close follow-up with PCP to to assess tolerance of above new medicines  Uncontrolled type 2 diabetes mellitus with hyperglycemia - her last hemoglobin A1c was 14.5 -Admits to compliance with medications however is not compliant with diet -Repeat hemoglobin A1c was 13.1, increased Lantus and NovoLog dose at discharge, advised better compliance with diet, insulin regimen due to numerous above complications of diabetes  Diabetic peripheral neuropathy -Continue gabapentin  Depression -Continue Cymbalta  Hypokalemia -Replaced  Discharge Exam: Vitals:   07/02/19 0821 07/02/19 1232  BP: 120/73 (!) 143/76  Pulse: (!) 103 (!) 111  Resp: 17 18  Temp:  98.8 F (37.1 C)  SpO2:  97%    General: Alert awake oriented x3 Cardiovascular: S1-S2, regular rhythm, tachycardic Respiratory: Clear  Discharge Instructions   Discharge Instructions    Ambulatory referral to Physical Therapy   Complete by: As directed    Discharge instructions   Complete by: As directed    Do not restrict salt, liberalize salt intake, use TED stockings during the daytime, use extreme caution with position changes and walker as needed, follow-up with your primary care physician in 1 week   Increase activity slowly   Complete by: As directed      Allergies as of 07/02/2019      Reactions   Tramadol Nausea And Vomiting   REACTION: Projectile vomiting      Medication List    STOP taking these medications   ibuprofen 200 MG  tablet Commonly known as: ADVIL     TAKE these medications   acetaminophen 500 MG tablet Commonly known as:  TYLENOL Take 2 tablets (1,000 mg total) by mouth every 6 (six) hours as needed for mild pain or headache (pain). What changed:   how much to take  when to take this  reasons to take this  Another medication with the same name was removed. Continue taking this medication, and follow the directions you see here.   DULoxetine 60 MG capsule Commonly known as: CYMBALTA Take 1 capsule (60 mg total) by mouth 2 (two) times daily.   fludrocortisone 0.1 MG tablet Commonly known as: FLORINEF Take 1 tablet (0.1 mg total) by mouth daily.   gabapentin 600 MG tablet Commonly known as: NEURONTIN 1 pill in the am, on mid-day/early evening and 2 at bedtime What changed:   how much to take  how to take this  when to take this  additional instructions   glucose blood test strip [employer] Testing Strips Use as instructed   HumaLOG KwikPen 100 UNIT/ML KwikPen Generic drug: insulin lispro Inject 0.1 mLs (10 Units total) into the skin 3 (three) times daily.   Lantus SoloStar 100 UNIT/ML Solostar Pen Generic drug: Insulin Glargine Inject 45 Units into the skin daily. What changed:   how much to take  when to take this  additional instructions   midodrine 10 MG tablet Commonly known as: PROAMATINE Take 1 tablet (10 mg total) by mouth 2 (two) times daily with a meal.   Pen Needles 31G X 8 MM Misc 1 Units by Does not apply route daily.      Allergies  Allergen Reactions  . Tramadol Nausea And Vomiting    REACTION: Projectile vomiting   Follow-up Information    Fulp, Cammie, MD. Schedule an appointment as soon as possible for a visit in 1 week(s).   Specialty: Family Medicine Contact information: [place] [address] [phone]        [facility] Follow up.   Specialty: Rehabilitation Why: They will call you this week to schedule a home visit Contact information: [address] [id number] [place] [phone]           The results of significant diagnostics from this hospitalization (including imaging, microbiology, ancillary and laboratory) are listed below for reference.    Significant Diagnostic Studies: DG Chest Port 1 View  Result Date: 06/30/2019 CLINICAL DATA:  Pain EXAM: PORTABLE CHEST 1 VIEW COMPARISON:  Radiograph 01/22/2014 FINDINGS: No consolidation, features of edema, pneumothorax, or effusion. Pulmonary vascularity is normally distributed. The cardiomediastinal contours are unremarkable. No acute osseous or soft tissue abnormality. IMPRESSION: No acute cardiopulmonary abnormality. Electronically Signed   By: Lovena Le M.D.   On: 06/30/2019 18:41   ECHOCARDIOGRAM COMPLETE  Result Date: 07/01/2019   ECHOCARDIOGRAM REPORT   Patient Name:   [name] [name] Date of Exam: 07/01/2019 Medical Rec #:  [id number]   Height:       66.0 in Accession #:    [id number]  Weight:       147.7 lb Date of Birth:  26-Aug-1960   BSA:          1.76 m Patient Age:    30 years    BP:           109/66 mmHg Patient Gender: F           HR:  89 bpm. Exam Location:  Inpatient Procedure: 2D Echo Indications:    Syncope 780.2/R55  History:        Patient has no prior history of Echocardiogram examinations.                 Risk Factors:Hypertension and Diabetes.  Sonographer:    Clayton Lefort RDCS (AE) Referring Phys: [place]  1. Left ventricular ejection fraction, by visual estimation, is 60 to 65%. The left ventricle has normal function. There is no left ventricular hypertrophy.  2. Indeterminate diastolic filling due to E-A fusion.  3. The left ventricle has no regional wall motion abnormalities.  4. Global right ventricle has normal systolic function.The right ventricular size is normal. No increase in right ventricular wall thickness.  5. Left atrial size was normal.  6. Right atrial size was normal.  7. The pericardial effusion is circumferential.  8.  Trivial pericardial effusion is present.  9. The mitral valve is normal in structure. No evidence of mitral valve regurgitation. No evidence of mitral stenosis. 10. The tricuspid valve is normal in structure. Tricuspid valve regurgitation is not demonstrated. 11. The aortic valve is tricuspid. Aortic valve regurgitation is not visualized. No evidence of aortic valve sclerosis or stenosis. 12. The pulmonic valve was not well visualized. Pulmonic valve regurgitation is not visualized. FINDINGS  Left Ventricle: Left ventricular ejection fraction, by visual estimation, is 60 to 65%. The left ventricle has normal function. The left ventricle has no regional wall motion abnormalities. There is no left ventricular hypertrophy. Indeterminate diastolic filling due to E-A fusion. Right Ventricle: The right ventricular size is normal. No increase in right ventricular wall thickness. Global RV systolic function is has normal systolic function. Left Atrium: Left atrial size was normal in size. Right Atrium: Right atrial size was normal in size Pericardium: Trivial pericardial effusion is present. The pericardial effusion is circumferential. Mitral Valve: The mitral valve is normal in structure. No evidence of mitral valve regurgitation. No evidence of mitral valve stenosis by observation. MV peak gradient, 6.9 mmHg. Tricuspid Valve: The tricuspid valve is normal in structure. Tricuspid valve regurgitation is not demonstrated. Aortic Valve: The aortic valve is tricuspid. Aortic valve regurgitation is not visualized. The aortic valve is structurally normal, with no evidence of sclerosis or stenosis. Aortic valve mean gradient measures 5.0 mmHg. Aortic valve peak gradient measures 8.1 mmHg. Aortic valve area, by VTI measures 2.18 cm. Pulmonic Valve: The pulmonic valve was not well visualized. Pulmonic valve regurgitation is not visualized. Pulmonic regurgitation is not visualized. No evidence of pulmonic stenosis. Aorta: The  aortic root is normal in size and structure. Pulmonary Artery: Indeterminate PASP, inadequate TR jet. IAS/Shunts: No atrial level shunt detected by color flow Doppler.  LEFT VENTRICLE PLAX 2D LVIDd:         3.50 cm LVIDs:         2.60 cm LV PW:         1.00 cm LV IVS:        1.00 cm LVOT diam:     1.80 cm LV SV:         26 ml LV SV Index:   14.81 LVOT Area:     2.54 cm  RIGHT VENTRICLE             IVC RV Basal diam:  2.60 cm     IVC diam: 1.80 cm RV S prime:     14.80 cm/s TAPSE (M-mode): 2.2 cm LEFT ATRIUM  Index       RIGHT ATRIUM          Index LA diam:        2.70 cm 1.54 cm/m  RA Area:     9.72 cm LA Vol (A2C):   27.4 ml 15.58 ml/m RA Volume:   18.50 ml 10.52 ml/m LA Vol (A4C):   20.3 ml 11.55 ml/m LA Biplane Vol: 23.7 ml 13.48 ml/m  AORTIC VALVE AV Area (Vmax):    2.10 cm AV Area (Vmean):   1.95 cm AV Area (VTI):     2.18 cm AV Vmax:           142.00 cm/s AV Vmean:          104.000 cm/s AV VTI:            0.246 m AV Peak Grad:      8.1 mmHg AV Mean Grad:      5.0 mmHg LVOT Vmax:         117.00 cm/s LVOT Vmean:        79.800 cm/s LVOT VTI:          0.211 m LVOT/AV VTI ratio: 0.86  AORTA Ao Root diam: 2.50 cm Ao Asc diam:  2.90 cm MITRAL VALVE MV Area (PHT): 4.58 cm   SHUNTS MV Peak grad:  6.9 mmHg   Systemic VTI:  0.21 m MV Mean grad:  3.0 mmHg   Systemic Diam: 1.80 cm MV Vmax:       1.31 m/s MV Vmean:      78.2 cm/s MV VTI:        0.31 m MV PHT:        48.00 msec  Carlyle Dolly MD Electronically signed by Carlyle Dolly MD Signature Date/Time: 07/01/2019/11:47:11 AM    Final     Microbiology: Recent Results (from the past 240 hour(s))  Respiratory Panel by RT PCR (Flu A&B, Covid) - Nasopharyngeal Swab     Status: None   Collection Time: 06/30/19  8:29 PM   Specimen: Nasopharyngeal Swab  Result Value Ref Range Status   SARS Coronavirus 2 by RT PCR NEGATIVE NEGATIVE Final    Comment: (NOTE) SARS-CoV-2 target nucleic acids are NOT DETECTED. The SARS-CoV-2 RNA is generally  detectable in upper respiratoy specimens during the acute phase of infection. The lowest concentration of SARS-CoV-2 viral copies this assay can detect is 131 copies/mL. A negative result does not preclude SARS-Cov-2 infection and should not be used as the sole basis for treatment or other patient management decisions. A negative result may occur with  improper specimen collection/handling, submission of specimen other than nasopharyngeal swab, presence of viral mutation(s) within the areas targeted by this assay, and inadequate number of viral copies (<131 copies/mL). A negative result must be combined with clinical observations, patient history, and epidemiological information. The expected result is Negative. Fact Sheet for Patients:  PinkCheek.be Fact Sheet for Healthcare Providers:  GravelBags.it This test is not yet ap proved or cleared by the [place] FDA and  has been authorized for detection and/or diagnosis of SARS-CoV-2 by FDA under an Emergency Use Authorization (EUA). This EUA will remain  in effect (meaning this test can be used) for the duration of the COVID-19 declaration under Section 564(b)(1) of the Act, 21 U.S.C. section 360bbb-3(b)(1), unless the authorization is terminated or revoked sooner.    Influenza A by PCR NEGATIVE NEGATIVE Final   Influenza B by PCR NEGATIVE NEGATIVE Final    Comment: (NOTE)  The Xpert Xpress SARS-CoV-2/FLU/RSV assay is intended as an aid in  the diagnosis of influenza from Nasopharyngeal swab specimens and  should not be used as a sole basis for treatment. Nasal washings and  aspirates are unacceptable for Xpert Xpress SARS-CoV-2/FLU/RSV  testing. Fact Sheet for Patients: PinkCheek.be Fact Sheet for Healthcare Providers: GravelBags.it This test is not yet approved or cleared by the [place] FDA and  has been  authorized for detection and/or diagnosis of SARS-CoV-2 by  FDA under an Emergency Use Authorization (EUA). This EUA will remain  in effect (meaning this test can be used) for the duration of the  Covid-19 declaration under Section 564(b)(1) of the Act, 21  U.S.C. section 360bbb-3(b)(1), unless the authorization is  terminated or revoked. Performed at [facility], [place] [address], [place], [place] [address]      Labs: Basic Metabolic Panel: Recent Labs  Lab 06/28/19 1225 06/30/19 1738 07/01/19 0515 07/02/19 0437  NA 136 140 139 138  K 4.5 3.5 3.2* 3.8  CL 99 105 107 107  CO2 24 25 25 24   GLUCOSE 386* 84 188* 263*  BUN 17 12 7 10   CREATININE 0.79 0.56 0.55 0.57  CALCIUM 9.8 9.3 8.5* 8.5*   Liver Function Tests: Recent Labs  Lab 06/28/19 1225 07/01/19 0515  AST 21 23  ALT 26 26  ALKPHOS 138* 93  BILITOT 0.3 0.8  PROT 6.9 5.9*  ALBUMIN 3.9 2.9*   No results for input(s): LIPASE, AMYLASE in the last 168 hours. No results for input(s): AMMONIA in the last 168 hours. CBC: Recent Labs  Lab 06/30/19 1738 07/01/19 0515 07/02/19 0437  WBC 11.7* 5.6 5.0  NEUTROABS 9.4*  --   --   HGB 13.5 12.3 12.3  HCT 40.4 36.4 37.0  MCV 93.1 91.7 92.7  PLT 250 197 192   Cardiac Enzymes: No results for input(s): CKTOTAL, CKMB, CKMBINDEX, TROPONINI in the last 168 hours. BNP: BNP (last 3 results) No results for input(s): BNP in the last 8760 hours.  ProBNP (last 3 results) No results for input(s): PROBNP in the last 8760 hours.  CBG: Recent Labs  Lab 07/01/19 1120 07/01/19 1621 07/01/19 2143 07/02/19 0622 07/02/19 1106  GLUCAP 261* 269* 220* 229* 260*       Signed:  Domenic Polite MD.  [facility] 07/03/2019, 2:54 PM

## 2019-07-05 ENCOUNTER — Ambulatory Visit: Payer: Self-pay | Attending: Family Medicine | Admitting: Licensed Clinical Social Worker

## 2019-07-05 DIAGNOSIS — F3341 Major depressive disorder, recurrent, in partial remission: Secondary | ICD-10-CM

## 2019-07-06 ENCOUNTER — Other Ambulatory Visit: Payer: Self-pay

## 2019-07-06 ENCOUNTER — Ambulatory Visit: Payer: Self-pay | Admitting: Pharmacist

## 2019-07-06 NOTE — BH Specialist Note (Signed)
Integrated Behavioral Health Visit via Telemedicine (Telephone)  07/06/2019 Philomena Course [id number]   Session Start time: 2:20pm  Session End time: 3:00pm Total time: 72   Referring Provider: Dr. Chapman Fitch Type of Visit: Telephonic Patient location: Home [facility] Provider location: [facility] Office All persons participating in visit: Patient and MSW Intern  Confirmed patient's address: Yes  Confirmed patient's phone number: Yes  Any changes to demographics: No   Confirmed patient's insurance: Yes  Any changes to patient's insurance: No   Discussed confidentiality: Yes    The following statements were read to the patient and/or legal guardian that are established with the [facility] Provider.  "The purpose of this phone visit is to provide behavioral health care while limiting exposure to the coronavirus (COVID19).  There is a possibility of technology failure and discussed alternative modes of communication if that failure occurs."  "By engaging in this telephone visit, you consent to the provision of healthcare.  Additionally, you authorize for your insurance to be billed for the services provided during this telephone visit."   Patient and/or legal guardian consented to telephone visit: Yes   PRESENTING CONCERNS: Patient and/or family reports the following symptoms/concerns: Patient reports an improvement of depressed mood and physical health symptoms since emergency department stay from 06/30/2019 to 07/02/2019 when she was started on midodrine for chronic orthostatic hypotension.  Duration of problem: Ongoing; Severity of problem: moderate   STRENGTHS (Protective Factors/Coping Skills): Patient is compliant with medication management Patient has family support Patient uses faith as encouragement  GOALS ADDRESSED: Patient will: 1.  Reduce symptoms of: anxiety and depression  2.  Increase knowledge and/or ability of: coping skills and  self-management skills  3.  Demonstrate ability to: Increase healthy adjustment to current life circumstances and Increase adequate support systems for patient/family  INTERVENTIONS: Interventions utilized:  Motivational Interviewing, Solution-Focused Strategies, Brief CBT, Supportive Counseling and Psychoeducation and/or Health Education Standardized Assessments completed: Not Needed  ASSESSMENT: Patient currently experiencing an improvement in depressed mood and a decrease in anxiety symptoms. Patient was admitted into [facility] hospital by EMS after fainting from 06/30/2019 to 07/02/2019. Patient reports that after she was stabilized on the medications midodrine and florinef in the hospital for orthostatic hypotension, she began to experience significant improvement in her mood and physical health. Patient reports that she feels back to herself and is confident in managing her depression and anxiety symptoms with the coping skills she has in place.   Patient may benefit from ongoing psychotherapy if symptoms of depression and anxiety return. Patient reports that she contacted [facility] to initiate long-term psychotherapy approximately a week ago and is waiting on a return call after the holidays. Patient reports she is unsure whether she feels she needs ongoing therapy services at this time. MSW and patient discussed coping skills learned in sessions that patient can utilize to manage behavioral health symptoms in the future.   PLAN: 1. Follow up with behavioral health clinician on : MSW intern encouraged patient to contact this MSW Intern or Christa See, LCSW at [facility] if additional support is needed.  2. Behavioral recommendations: MSW Intern encouraged patient to continue to use coping skills discussed in previous sessions and continue engaging in social and family support.  3. Referral(s): [employer] forces logistics/support/administrative officer (LME/Outside Clinic) [place] for ongoing psychotherapy.  Berniece Salines MSW Intern  07/06/2019  2:09pm

## 2019-07-10 ENCOUNTER — Other Ambulatory Visit (HOSPITAL_COMMUNITY): Payer: No Typology Code available for payment source

## 2019-07-31 ENCOUNTER — Other Ambulatory Visit: Payer: Self-pay | Admitting: Family Medicine

## 2019-07-31 DIAGNOSIS — E1165 Type 2 diabetes mellitus with hyperglycemia: Secondary | ICD-10-CM

## 2019-07-31 DIAGNOSIS — E1142 Type 2 diabetes mellitus with diabetic polyneuropathy: Secondary | ICD-10-CM

## 2019-07-31 MED FILL — ?DULOXETINE HCL 60 MG CPEP: 60 | 30 days supply | Qty: 60 | Fill #1

## 2019-07-31 MED FILL — FLUDROCORTISONE 0.1 MG TAB: 0.1 | 30 days supply | Qty: 30 | Fill #1

## 2019-08-01 MED FILL — GABAPENTIN 600 MG TABLET: 600 | 30 days supply | Qty: 120 | Fill #0

## 2019-08-01 NOTE — Telephone Encounter (Signed)
1) Medication(s) Requested (by name):gabapentin (NEURONTIN) 600 MG tablet [id number]   2) [facility], [place]   3) Special Requests:   Approved medications will be sent to the pharmacy, we will reach out if there is an issue.  Requests made after 3pm may not be addressed until the following business day!  If a patient is unsure of the name of the medication(s) please note and ask patient to call back when they are able to provide all info, do not send to responsible party until all information is available!

## 2019-08-02 NOTE — Telephone Encounter (Signed)
Patient has an appointment on 1/20

## 2019-08-09 ENCOUNTER — Encounter: Payer: Self-pay | Admitting: Family Medicine

## 2019-08-09 ENCOUNTER — Ambulatory Visit: Payer: Self-pay | Attending: Family Medicine | Admitting: Family Medicine

## 2019-08-09 ENCOUNTER — Other Ambulatory Visit: Payer: Self-pay

## 2019-08-09 VITALS — BP 130/80 | HR 115 | Temp 98.5°F | Resp 16 | Wt 138.4 lb

## 2019-08-09 DIAGNOSIS — R000 Tachycardia, unspecified: Secondary | ICD-10-CM

## 2019-08-09 DIAGNOSIS — Z09 Encounter for follow-up examination after completed treatment for conditions other than malignant neoplasm: Secondary | ICD-10-CM

## 2019-08-09 DIAGNOSIS — E1142 Type 2 diabetes mellitus with diabetic polyneuropathy: Secondary | ICD-10-CM

## 2019-08-09 DIAGNOSIS — I951 Orthostatic hypotension: Secondary | ICD-10-CM

## 2019-08-09 DIAGNOSIS — F3341 Major depressive disorder, recurrent, in partial remission: Secondary | ICD-10-CM

## 2019-08-09 DIAGNOSIS — E1165 Type 2 diabetes mellitus with hyperglycemia: Secondary | ICD-10-CM

## 2019-08-09 LAB — POCT URINALYSIS DIP (CLINITEK)
Bilirubin, UA: NEGATIVE
Blood, UA: NEGATIVE
Glucose, UA: >=1000 mg/dL — AB
Leukocytes, UA: NEGATIVE
Nitrite, UA: NEGATIVE
POC PROTEIN,UA: NEGATIVE
Spec Grav, UA: 1.015
Urobilinogen, UA: 0.2 U/dL
pH, UA: 7

## 2019-08-09 LAB — GLUCOSE, POCT (MANUAL RESULT ENTRY)
POC Glucose: 446 mg/dL — AB (ref 70–99)
POC Glucose: 420 mg/dL — AB (ref 70–99)

## 2019-08-09 MED ORDER — LANTUS SOLOSTAR 100 UNIT/ML ~~LOC~~ SOPN: 50.0000 [IU] | PEN_INJECTOR | Freq: Every day | SUBCUTANEOUS | 99 refills | Status: DC

## 2019-08-09 MED ORDER — INSULIN ASPART 100 UNIT/ML ~~LOC~~ SOLN
10.0000 [IU] | Freq: Once | SUBCUTANEOUS | Status: AC
  Administered 2019-08-09: 10 [IU] via SUBCUTANEOUS

## 2019-08-09 NOTE — Progress Notes (Signed)
Patient ID: [name] [name], female    DOB: [birth year]  MRN: [id number]   SUBJECTIVE:  [name] [name] is a 59 y.o. female who presents for hospital f/u.  She reports that she does feel better status post hospitalization.  She reports that on the day of her hospitalization, she was driving and started to feel unwell and after getting into her house she was told by a family member that she passed out.  Patient reports that she does not drive by herself and generally has someone else drive her.  She reports that a family member brought her to today's visit.  She reports that she has had issues for months with feeling dizzy/lightheaded especially when changing positions such as when sitting up on the side of the bed in the morning and then attempting to stand up or if she has been seated and then stands up and starts walking.  She does recall prior conversation regarding the need to make very slow changes in position due to issues with regulation of her blood pressure when changing from seated to standing.  She reports that since her hospitalization she started taking the medication daily to help keep her blood pressure higher and is wearing support hose/compression hose and she feels that her blood pressure levels are improved and that she is not as dizzy with changes in position.  She has had no additional syncopal episodes since her hospitalization.  She still has some issues with forgetting to take her once daily insulin and forgetting to take premeal insulin.  Home blood sugars have remained elevated but she does not check them on a daily basis.  She sometimes forgets to check her sugars.  She does have issues with increased thirst and frequent urination.  No abdominal pain or burning with urination.  No chest pain or palpitations and no shortness of breath or cough.  She continues to have painful neuropathy in her feet as well as chronic issues with neck pain.  She believes that the duloxetine has helped with both  her chronic pain as well as with her depression.  She denies any suicidal thoughts or ideations at this time.  Where: [facility] When: 06/30/2019-07/02/2019 Primary [id number] and collapse, chronic orthostatic hypotension, autonomic neuropathy, uncontrolled type 2 DM, severe peripheral neuropathy, depression, chronic pain; diabetic polyneuropathy   Patient Active Problem List   Diagnosis Date Noted  . Adrenal insufficiency ([place]) 09/07/2019  . Recurrent syncope 09/06/2019  . Sinus tachycardia 09/06/2019  . Hypokalemia 09/06/2019  . Pulmonary nodules 09/06/2019  . Orthostasis   . Syncope and collapse 06/30/2019  . Hypotension 06/29/2019  . Chronic pain syndrome 08/24/2017  . Diabetic polyneuropathy associated with type 2 diabetes mellitus ([employer]) 08/24/2017  . Restless leg syndrome 08/24/2017  . Vertigo 04/09/2016  . Acute sinusitis 06/27/2015  . Cervical disc disorder with radiculopathy of cervical region 02/20/2015  . Neck muscle spasm 10/23/2014  . Healthcare maintenance 10/23/2014  . Depression 11/25/2013  . Diabetes type 2, uncontrolled ([place])      Current Outpatient Medications on File Prior to Visit  Medication Sig Dispense Refill  . acetaminophen (TYLENOL) 500 MG tablet Take 2 tablets (1,000 mg total) by mouth every 6 (six) hours as needed for mild pain or headache (pain).  0  . DULoxetine (CYMBALTA) 60 MG capsule Take 1 capsule (60 mg total) by mouth 2 (two) times daily. 60 capsule 4  . glucose blood test strip [employer] Testing Strips Use as instructed 100 each 12  .  HUMALOG KWIKPEN 100 UNIT/ML KwikPen Inject 0.1 mLs (10 Units total) into the skin 3 (three) times daily. (Patient taking differently: Inject 10 Units into the skin daily at 12 noon. Per sliding scale) 15 mL prn  . Insulin Pen Needle (PEN NEEDLES) 31G X 8 MM MISC 1 Units by Does not apply route daily. 100 each 2   No current facility-administered medications on file prior to visit.    Allergies    Allergen Reactions  . Tramadol Nausea And Vomiting    REACTION: Projectile vomiting    Social History   Tobacco Use  . Smoking status: Former Smoker    Quit date: 08/03/1978    Years since quitting: 41.2  . Smokeless tobacco: Never Used  . Tobacco comment: Smoked age [age]  Substance Use Topics  . Alcohol use: No    Comment: Former  . Drug use: No     Family History  Problem Relation Age of Onset  . Depression Mother        type 1  . Diabetes Mother   . Renal Disease Mother   . Lung cancer Father   . Heart attack Brother        Died age 17 - was told due to massive MI/heart "exploded"  . Heart failure Maternal Grandmother        Details not totally clear    Past Surgical History:  Procedure Laterality Date  . ABDOMINAL HYSTERECTOMY    . CESAREAN SECTION      ROS: Review of Systems  Constitutional: Positive for fatigue (mild). Negative for chills and fever.  HENT: Negative for sore throat and trouble swallowing.   Eyes: Positive for visual disturbance (occasional; also uses reading glasses). Negative for photophobia.  Respiratory: Negative for cough.   Cardiovascular: Negative for chest pain, palpitations and leg swelling.  Gastrointestinal: Negative for abdominal pain, blood in stool, constipation, diarrhea and nausea.  Endocrine: Positive for polydipsia, polyphagia and polyuria.  Genitourinary: Positive for frequency (when blood sugars are high). Negative for dysuria.  Musculoskeletal: Negative for arthralgias and gait problem.  Neurological: Positive for dizziness and light-headedness. Negative for headaches.       Dizziness/lightheadedness with changes in position  Hematological: Negative for adenopathy. Does not bruise/bleed easily.  Psychiatric/Behavioral: Negative for self-injury, sleep disturbance and suicidal ideas. The patient is not nervous/anxious.        Depression improved     PHYSICAL EXAM: BP 130/80   Pulse (!) 115   Temp 98.5 F (36.9 C)  (Oral)   Resp 16   Wt 138 lb 6.4 oz (62.8 kg)   SpO2 97%   BMI 22.34 kg/m   Physical Exam Constitutional:      Appearance: Normal appearance. She is not ill-appearing.  Neck:     Vascular: No carotid bruit.  Cardiovascular:     Rate and Rhythm: Regular rhythm. Tachycardia present.     Comments: Mild tachycardia Pulmonary:     Effort: Pulmonary effort is normal.     Breath sounds: Normal breath sounds.  Abdominal:     Palpations: Abdomen is soft.     Tenderness: There is no right CVA tenderness, left CVA tenderness, guarding or rebound.  Musculoskeletal:     Cervical back: Normal range of motion and neck supple.     Right lower leg: No edema.     Left lower leg: No edema.  Skin:    General: Skin is warm and dry.  Neurological:     General: No focal  deficit present.     Mental Status: She is alert and oriented to person, place, and time.  Psychiatric:        Mood and Affect: Mood normal.        Behavior: Behavior normal.      ASSESSMENT AND PLAN: 1. Uncontrolled type 2 diabetes mellitus with hyperglycemia ([place]); 2.  Diabetic peripheral neuropathy; 4.  Hospital discharge follow-up Patient's most recent hemoglobin A1c on 06/28/2019 was 13.1, still consistent with poorly controlled diabetes.  Again discussed the importance of getting hemoglobin A1c closer to 7.0 or less to help stop progression of complications associated with type 2 diabetes as patient currently already has issues with diabetic peripheral neuropathy and autonomic neuropathy.  At today's visit her glucose is 446.  She is being given 10 units of regular insulin while here in the office.  She will have basic metabolic panel and microalbumin in follow-up of her diabetes.  Repeat glucose of 420 prior to discharge from clinic.  She is to take her home insulin doses of both long and short acting insulins. - POCT glucose (manual entry) - Microalbumin / creatinine urine ratio - Basic Metabolic Panel - insulin aspart  (novoLOG) injection 10 Units - POCT URINALYSIS DIP (CLINITEK) - POCT glucose (manual entry)  3. Orthostatic hypotension 4.  Hospital discharge follow-up She is status post hospitalization from 06/30/2019 through 07/02/2019 following a syncopal episode which was thought to be secondary to chronic orthostatic hypotension and autonomic neuropathy related to uncontrolled type 2 diabetes.  She reports some improvement in symptoms by wearing her TED hose/compression stockings and being cautious with position changes.  She reports that she is taking the Florinef as well as midodrine prescribed during her hospitalization.  She has had no further syncopal episodes.  5. Recurrent major depressive disorder, in partial remission ([place]) She reports that she is doing better on Cymbalta/duloxetine and she does not feel as depressed as she has been in the past.  She denies any suicidal thoughts or ideations.  6. Tachycardia Patient with tachycardia at today's visit and again discussed the importance of remaining well-hydrated.  Patient has poorly controlled diabetes which causes her to have frequent urination which can contribute to her dehydration and orthostatic hypotension.  We will recheck patient's electrolytes at today's visit.    Future Appointments  Date Time Provider [place]  11/06/2019  1:30 PM Camillia Herter, NP [facility] None    An After Visit Summary was printed and given to the patient.  Greater than 40 minutes of face-to-face time was spent with the patient at today's visit as well as additional time for review of records related to hospitalization, past visits, specialty follow-up of cardiology, labs and creation of today's visit note.  Antony Blackbird, MD, [facility]

## 2019-08-09 NOTE — Patient Instructions (Signed)

## 2019-08-10 LAB — BASIC METABOLIC PANEL WITH GFR
BUN/Creatinine Ratio: 13 (ref 9–23)
BUN: 8 mg/dL (ref 6–24)
CO2: 26 mmol/L (ref 20–29)
Calcium: 9.3 mg/dL (ref 8.7–10.2)
Chloride: 98 mmol/L (ref 96–106)
Creatinine, Ser: 0.63 mg/dL (ref 0.57–1.00)
GFR calc Af Amer: 114 mL/min/1.73
GFR calc non Af Amer: 98 mL/min/1.73
Glucose: 481 mg/dL — ABNORMAL HIGH (ref 65–99)
Potassium: 4.1 mmol/L (ref 3.5–5.2)
Sodium: 135 mmol/L (ref 134–144)

## 2019-08-10 LAB — MICROALBUMIN / CREATININE URINE RATIO
Creatinine, Urine: 49.2 mg/dL
Microalb/Creat Ratio: 35 mg/g{creat} — ABNORMAL HIGH (ref 0–29)
Microalbumin, Urine: 17.4 ug/mL

## 2019-08-10 MED FILL — ?BASAGLAR 100 UNITS/ML KWPE: 100 | 30 days supply | Qty: 15 | Fill #0

## 2019-08-11 ENCOUNTER — Telehealth (INDEPENDENT_AMBULATORY_CARE_PROVIDER_SITE_OTHER): Payer: Self-pay

## 2019-08-11 NOTE — Telephone Encounter (Signed)
-----   Message from Antony Blackbird, MD sent at 08/10/2019  5:40 PM EST ----- Urine creatinine/microalbumin ratio is slightly increased- can indicate early damage to the kidneys from DM/HTN. Glucose of  481 on recent blood work otherwise normal electrolytes

## 2019-08-11 NOTE — Telephone Encounter (Signed)
Patient verified date of birth. She is aware that her Urine creatinine/microalbumin ratio is slightly increased- can indicate early damage to the kidneys from DM/HTN. Glucose of 481 on recent blood work otherwise normal electrolytes. She verbalized understanding of results. Nat Christen, CMA

## 2019-09-04 MED FILL — FLUDROCORTISONE 0.1 MG TAB: 0.1 | 30 days supply | Qty: 30 | Fill #2

## 2019-09-04 MED FILL — DULoxetine HCL 60 MG CPEP: 60 | 30 days supply | Qty: 60 | Fill #2

## 2019-09-04 MED FILL — GABAPENTIN 600 MG TABLET: 600 | 30 days supply | Qty: 120 | Fill #1

## 2019-09-05 ENCOUNTER — Other Ambulatory Visit: Payer: Self-pay

## 2019-09-05 ENCOUNTER — Emergency Department (HOSPITAL_COMMUNITY): Payer: Self-pay

## 2019-09-05 ENCOUNTER — Inpatient Hospital Stay (HOSPITAL_COMMUNITY)
Admission: EM | Admit: 2019-09-05 | Discharge: 2019-09-07 | DRG: 057 | Disposition: A | Discharge status: Home / Self Care | Payer: Self-pay | Attending: Internal Medicine | Admitting: Internal Medicine

## 2019-09-05 ENCOUNTER — Telehealth: Payer: Self-pay | Admitting: *Deleted

## 2019-09-05 ENCOUNTER — Encounter (HOSPITAL_COMMUNITY): Payer: Self-pay

## 2019-09-05 DIAGNOSIS — R000 Tachycardia, unspecified: Secondary | ICD-10-CM

## 2019-09-05 DIAGNOSIS — H409 Unspecified glaucoma: Secondary | ICD-10-CM | POA: Diagnosis present

## 2019-09-05 DIAGNOSIS — Z818 Family history of other mental and behavioral disorders: Secondary | ICD-10-CM

## 2019-09-05 DIAGNOSIS — Z885 Allergy status to narcotic agent status: Secondary | ICD-10-CM

## 2019-09-05 DIAGNOSIS — E875 Hyperkalemia: Secondary | ICD-10-CM | POA: Diagnosis present

## 2019-09-05 DIAGNOSIS — K219 Gastro-esophageal reflux disease without esophagitis: Secondary | ICD-10-CM | POA: Diagnosis present

## 2019-09-05 DIAGNOSIS — E11649 Type 2 diabetes mellitus with hypoglycemia without coma: Secondary | ICD-10-CM | POA: Diagnosis present

## 2019-09-05 DIAGNOSIS — Z8249 Family history of ischemic heart disease and other diseases of the circulatory system: Secondary | ICD-10-CM

## 2019-09-05 DIAGNOSIS — I959 Hypotension, unspecified: Secondary | ICD-10-CM | POA: Diagnosis present

## 2019-09-05 DIAGNOSIS — I951 Orthostatic hypotension: Secondary | ICD-10-CM

## 2019-09-05 DIAGNOSIS — I10 Essential (primary) hypertension: Secondary | ICD-10-CM | POA: Diagnosis present

## 2019-09-05 DIAGNOSIS — G2581 Restless legs syndrome: Secondary | ICD-10-CM | POA: Diagnosis present

## 2019-09-05 DIAGNOSIS — E2740 Unspecified adrenocortical insufficiency: Secondary | ICD-10-CM | POA: Diagnosis present

## 2019-09-05 DIAGNOSIS — M5010 Cervical disc disorder with radiculopathy, unspecified cervical region: Secondary | ICD-10-CM | POA: Diagnosis present

## 2019-09-05 DIAGNOSIS — Z794 Long term (current) use of insulin: Secondary | ICD-10-CM

## 2019-09-05 DIAGNOSIS — E1142 Type 2 diabetes mellitus with diabetic polyneuropathy: Secondary | ICD-10-CM | POA: Diagnosis present

## 2019-09-05 DIAGNOSIS — Z801 Family history of malignant neoplasm of trachea, bronchus and lung: Secondary | ICD-10-CM

## 2019-09-05 DIAGNOSIS — Z841 Family history of disorders of kidney and ureter: Secondary | ICD-10-CM

## 2019-09-05 DIAGNOSIS — G894 Chronic pain syndrome: Secondary | ICD-10-CM | POA: Diagnosis present

## 2019-09-05 DIAGNOSIS — R918 Other nonspecific abnormal finding of lung field: Secondary | ICD-10-CM | POA: Diagnosis present

## 2019-09-05 DIAGNOSIS — R55 Syncope and collapse: Secondary | ICD-10-CM

## 2019-09-05 DIAGNOSIS — E1165 Type 2 diabetes mellitus with hyperglycemia: Secondary | ICD-10-CM | POA: Diagnosis present

## 2019-09-05 DIAGNOSIS — G903 Multi-system degeneration of the autonomic nervous system: Principal | ICD-10-CM | POA: Diagnosis present

## 2019-09-05 DIAGNOSIS — Z20822 Contact with and (suspected) exposure to covid-19: Secondary | ICD-10-CM | POA: Diagnosis present

## 2019-09-05 DIAGNOSIS — E860 Dehydration: Secondary | ICD-10-CM | POA: Diagnosis present

## 2019-09-05 DIAGNOSIS — Z87891 Personal history of nicotine dependence: Secondary | ICD-10-CM

## 2019-09-05 DIAGNOSIS — Z833 Family history of diabetes mellitus: Secondary | ICD-10-CM

## 2019-09-05 DIAGNOSIS — E876 Hypokalemia: Secondary | ICD-10-CM | POA: Diagnosis present

## 2019-09-05 HISTORY — DX: Complete loss of teeth, unspecified cause, unspecified class: K08.109

## 2019-09-05 HISTORY — DX: Unspecified tear of unspecified meniscus, current injury, unspecified knee, initial encounter: S83.209A

## 2019-09-05 HISTORY — DX: Other intervertebral disc displacement, lumbar region: M51.26

## 2019-09-05 HISTORY — DX: Unspecified osteoarthritis, unspecified site: M19.90

## 2019-09-05 LAB — URINALYSIS, ROUTINE W REFLEX MICROSCOPIC
Bilirubin Urine: NEGATIVE
Glucose, UA: >=500 mg/dL — AB
Hgb urine dipstick: NEGATIVE
Ketones, ur: 20 mg/dL — AB
Leukocytes,Ua: NEGATIVE
Nitrite: NEGATIVE
Protein, ur: NEGATIVE mg/dL
Specific Gravity, Urine: 1.022 (ref 1.005–1.030)
pH: 5 (ref 5.0–8.0)

## 2019-09-05 LAB — I-STAT BETA HCG BLOOD, ED (MC, WL, AP ONLY): I-stat hCG, quantitative: 6 m[IU]/mL — ABNORMAL HIGH (ref <5)

## 2019-09-05 LAB — BASIC METABOLIC PANEL
Anion gap: 13 (ref 5–15)
BUN: 18 mg/dL (ref 6–20)
CO2: 25 mmol/L (ref 22–32)
Calcium: 9.5 mg/dL (ref 8.9–10.3)
Chloride: 103 mmol/L (ref 98–111)
Creatinine, Ser: 0.66 mg/dL (ref 0.44–1.00)
GFR calc Af Amer: >60 mL/min (ref >60)
GFR calc non Af Amer: >60 mL/min (ref >60)
Glucose, Bld: 197 mg/dL — ABNORMAL HIGH (ref 70–99)
Potassium: 3.2 mmol/L — ABNORMAL LOW (ref 3.5–5.1)
Sodium: 141 mmol/L (ref 135–145)

## 2019-09-05 LAB — CBC
HCT: 40 % (ref 36.0–46.0)
Hemoglobin: 13.8 g/dL (ref 12.0–15.0)
MCH: 31.5 pg (ref 26.0–34.0)
MCHC: 34.5 g/dL (ref 30.0–36.0)
MCV: 91.3 fL (ref 80.0–100.0)
Platelets: 245 10*3/uL (ref 150–400)
RBC: 4.38 MIL/uL (ref 3.87–5.11)
RDW: 12.1 % (ref 11.5–15.5)
WBC: 10.8 10*3/uL — ABNORMAL HIGH (ref 4.0–10.5)
nRBC: 0 % (ref 0.0–0.2)

## 2019-09-05 LAB — T4, FREE: Free T4: 0.96 ng/dL (ref 0.61–1.12)

## 2019-09-05 LAB — TSH: TSH: 0.794 u[IU]/mL (ref 0.350–4.500)

## 2019-09-05 LAB — CBG MONITORING, ED: Glucose-Capillary: 181 mg/dL — ABNORMAL HIGH (ref 70–99)

## 2019-09-05 MED ORDER — SODIUM CHLORIDE 0.9 % IV BOLUS
1000.0000 mL | Freq: Once | INTRAVENOUS | Status: AC
  Administered 2019-09-05: 1000 mL via INTRAVENOUS

## 2019-09-05 MED ORDER — IOHEXOL 350 MG/ML SOLN
80.0000 mL | Freq: Once | INTRAVENOUS | Status: AC | PRN
  Administered 2019-09-05: 80 mL via INTRAVENOUS

## 2019-09-05 NOTE — Telephone Encounter (Signed)
I spoke with [name] [name], she stated she's doing okay, instead of her three months with the PA, she will wait for her June appointment with her doctor.

## 2019-09-05 NOTE — ED Triage Notes (Signed)
Pt from home w cc of feeling tired, near syncope, pale & faint. Denies syncopal event, fall, LOC. HX DM2 & HTN  VSS en route

## 2019-09-05 NOTE — ED Provider Notes (Signed)
[facility] EMERGENCY DEPARTMENT Provider Note   CSN: [id number] Arrival date & time: 09/05/19  2023     History Chief Complaint  Patient presents with  . Near Syncope    [name] [name] is a 59 y.o. female.  59 yo F with a chief complaints of a syncopal event.  Occurred without prodrome patient was walking into her kitchen.  Woke up in her bed.  States that family found her on the ground and put her back in the bed.  She denies any recent illness denies cough congestion or fever denies chest pain or abdominal pain.  Denies nausea or vomiting.  Denies diarrhea.  Denies dark stool or blood in her stool.  Denies injury in the syncopal event.  Patient has been taking her medicines as prescribed.  No new medications recently.  The history is provided by the patient.  Near Syncope This is a new problem. The current episode started 2 days ago. The problem occurs constantly. The problem has not changed since onset.Pertinent negatives include no chest pain, no headaches and no shortness of breath. Nothing aggravates the symptoms. Nothing relieves the symptoms. She has tried nothing for the symptoms. The treatment provided no relief.       Past Medical History:  Diagnosis Date  . Cervical disc disorder with radiculopathy of cervical region   . Chest pain   . Chronic low back pain    HNP  . Chronic pain syndrome   . Depression dx 1997  . Depression   . Diabetes mellitus, type 2 ([place]) 2011   No insulin  . Diabetic polyneuropathy ([place])   . GERD (gastroesophageal reflux disease)   . Glaucoma   . Headache(784.0)   . Herniated disc   . Hypertension    Lab 11/2011:  CXR, EKG, CBC, TSH, BMet, troponin-normal; lipid profile: 188, 131, 36, 126   . Non-compliance   . Pancreatic cyst    Endoscopic aspiration in 09/2009  . Pneumonia 08/02/2012  . Shingles   . Vertigo     Patient Active Problem List   Diagnosis Date Noted  . Syncope and collapse 06/30/2019  . Hypotension  06/29/2019  . Chronic pain syndrome 08/24/2017  . Diabetic polyneuropathy associated with type 2 diabetes mellitus ([place]) 08/24/2017  . Restless leg syndrome 08/24/2017  . Vertigo 04/09/2016  . Acute sinusitis 06/27/2015  . Cervical disc disorder with radiculopathy of cervical region 02/20/2015  . Neck muscle spasm 10/23/2014  . Healthcare maintenance 10/23/2014  . Depression 11/25/2013  . Diabetes type 2, uncontrolled ([place])     Past Surgical History:  Procedure Laterality Date  . ABDOMINAL HYSTERECTOMY    . CESAREAN SECTION       OB History   No obstetric history on file.     Family History  Problem Relation Age of Onset  . Depression Mother        type 1  . Diabetes Mother   . Renal Disease Mother   . Lung cancer Father     Social History   Tobacco Use  . Smoking status: Former Smoker    Quit date: 08/03/1978    Years since quitting: 41.1  . Smokeless tobacco: Never Used  Substance Use Topics  . Alcohol use: No    Comment: Former  . Drug use: No    Home Medications Prior to Admission medications   Medication Sig Start Date End Date Taking? Authorizing Provider  acetaminophen (TYLENOL) 500 MG tablet Take 2 tablets (1,000 mg  total) by mouth every 6 (six) hours as needed for mild pain or headache (pain). 07/02/19   Domenic Polite, MD  DULoxetine (CYMBALTA) 60 MG capsule Take 1 capsule (60 mg total) by mouth 2 (two) times daily. 06/28/19   Fulp, Cammie, MD  fludrocortisone (FLORINEF) 0.1 MG tablet Take 1 tablet (0.1 mg total) by mouth daily. 06/29/19   Lorretta Harp, MD  gabapentin (NEURONTIN) 600 MG tablet Take 1-2 tablets (600-1,200 mg total) by mouth See admin instructions. Take one tablet (600 mg) by mouth every morning and afternoon, take two tablets (1200 mg) at night 08/01/19   Fulp, Cammie, MD  glucose blood test strip [facility] Testing Strips Use as instructed 08/18/17   Lockamy, Timothy, DO  HUMALOG KWIKPEN 100 UNIT/ML KwikPen Inject 0.1 mLs (10 Units  total) into the skin 3 (three) times daily. 03/15/19   Fulp, Cammie, MD  Insulin Glargine (LANTUS SOLOSTAR) 100 UNIT/ML Solostar Pen Inject 50 Units into the skin daily. 08/09/19   Fulp, Cammie, MD  Insulin Pen Needle (PEN NEEDLES) 31G X 8 MM MISC 1 Units by Does not apply route daily. 08/20/17   Nuala Alpha, DO  midodrine (PROAMATINE) 10 MG tablet Take 1 tablet (10 mg total) by mouth 2 (two) times daily with a meal. 07/02/19   Domenic Polite, MD    Allergies    Tramadol  Review of Systems   Review of Systems  Constitutional: Negative for chills and fever.  HENT: Negative for congestion and rhinorrhea.   Eyes: Negative for redness and visual disturbance.  Respiratory: Negative for shortness of breath and wheezing.   Cardiovascular: Positive for near-syncope. Negative for chest pain and palpitations.  Gastrointestinal: Negative for nausea and vomiting.  Genitourinary: Negative for dysuria and urgency.  Musculoskeletal: Negative for arthralgias and myalgias.  Skin: Negative for pallor and wound.  Neurological: Positive for syncope. Negative for dizziness and headaches.    Physical Exam Updated Vital Signs BP (!) 154/93   Pulse (!) 122   Resp (!) 7   Ht 5\' 6"  (1.676 m)   Wt 65.8 kg   SpO2 99%   BMI 23.40 kg/m   Physical Exam Vitals and nursing note reviewed.  Constitutional:      General: She is not in acute distress.    Appearance: She is well-developed. She is not diaphoretic.  HENT:     Head: Normocephalic and atraumatic.  Eyes:     Pupils: Pupils are equal, round, and reactive to light.  Cardiovascular:     Rate and Rhythm: Regular rhythm. Tachycardia present.     Heart sounds: No murmur. No friction rub. No gallop.   Pulmonary:     Effort: Pulmonary effort is normal.     Breath sounds: No wheezing or rales.  Abdominal:     General: There is no distension.     Palpations: Abdomen is soft.     Tenderness: There is no abdominal tenderness.  Musculoskeletal:         General: No tenderness.     Cervical back: Normal range of motion and neck supple.  Skin:    General: Skin is warm and dry.  Neurological:     Mental Status: She is alert and oriented to person, place, and time.  Psychiatric:        Behavior: Behavior normal.     ED Results / Procedures / Treatments   Labs (all labs ordered are listed, but only abnormal results are displayed) Labs Reviewed  BASIC METABOLIC PANEL -  Abnormal; Notable for the following components:      Result Value   Potassium 3.2 (*)    Glucose, Bld 197 (*)    All other components within normal limits  CBC - Abnormal; Notable for the following components:   WBC 10.8 (*)    All other components within normal limits  URINALYSIS, ROUTINE W REFLEX MICROSCOPIC - Abnormal; Notable for the following components:   APPearance HAZY (*)    Glucose, UA >=500 (*)    Ketones, ur 20 (*)    Bacteria, UA RARE (*)    All other components within normal limits  CBG MONITORING, ED - Abnormal; Notable for the following components:   Glucose-Capillary 181 (*)    All other components within normal limits  I-STAT BETA HCG BLOOD, ED ([facility], [facility], [facility] ONLY) - Abnormal; Notable for the following components:   I-stat hCG, quantitative 6.0 (*)    All other components within normal limits  SARS CORONAVIRUS 2 (TAT 6-24 HRS)  TSH  T4, FREE  BRAIN NATRIURETIC PEPTIDE  RAPID URINE DRUG SCREEN, HOSP PERFORMED  TROPONIN I (HIGH SENSITIVITY)    EKG EKG Interpretation  Date/Time:  Tuesday September 05 2019 20:35:53 EST Ventricular Rate:  124 PR Interval:    QRS Duration: 81 QT Interval:  339 QTC Calculation: 487 R Axis:   86 Text Interpretation: Sinus tachycardia Borderline prolonged QT interval No significant change since last tracing Confirmed by Deno Etienne [phone]) on 09/05/2019 9:41:32 PM   Radiology CT Angio Chest PE W and/or Wo Contrast  Result Date: 09/05/2019 CLINICAL DATA:  Syncope. EXAM: CT ANGIOGRAPHY CHEST WITH CONTRAST  TECHNIQUE: Multidetector CT imaging of the chest was performed using the standard protocol during bolus administration of intravenous contrast. Multiplanar CT image reconstructions and MIPs were obtained to evaluate the vascular anatomy. CONTRAST:  80 mL of Omnipaque 350. COMPARISON:  None. FINDINGS: Cardiovascular: Contrast injection is sufficient to demonstrate satisfactory opacification of the pulmonary arteries to the segmental level. There is no pulmonary embolus. The main pulmonary artery is within normal limits for size. There is no CT evidence of acute right heart strain. The visualized aorta is normal. Heart size is normal, without pericardial effusion. Mediastinum/Nodes: --No mediastinal or hilar lymphadenopathy. --No axillary lymphadenopathy. --No supraclavicular lymphadenopathy. --Normal thyroid gland. --The esophagus is unremarkable Lungs/Pleura: There are few scattered 3-4 mm pulmonary nodules in the right upper lobe (axial series 6, image 15 and 26). There is a cluster of micro nodules in the right upper lobe (axial series 6, image 47). There is a 3-4 mm pulmonary nodule in the anterior left upper lobe (axial series 6, image 36). Upper Abdomen: No acute abnormality. Musculoskeletal: No chest wall abnormality. No acute or significant osseous findings. Review of the MIP images confirms the above findings. IMPRESSION: 1. No acute pulmonary embolism. 2. No acute cardiopulmonary process. 3. Scattered bilateral pulmonary nodules all measuring less than approximately 5 mm. These are favored to represent an infectious or inflammatory process. No follow-up needed if patient is low-risk (and has no known or suspected primary neoplasm). Non-contrast chest CT can be considered in 12 months if patient is high-risk. This recommendation follows the consensus statement: Guidelines for Management of Incidental Pulmonary Nodules Detected on CT Images: From the Fleischner Society 2017; Radiology 2017; 284:228-243.  Electronically Signed   By: Constance Holster M.D.   On: 09/05/2019 23:50    Procedures Procedures (including critical care time)  Medications Ordered in ED Medications  sodium chloride 0.9 % bolus 1,000 mL (  0 mLs Intravenous Stopped 09/05/19 2159)  sodium chloride 0.9 % bolus 1,000 mL (1,000 mLs Intravenous New Bag/Given 09/05/19 2210)  iohexol (OMNIPAQUE) 350 MG/ML injection 80 mL (80 mLs Intravenous Contrast Given 09/05/19 2324)    ED Course  I have reviewed the triage vital signs and the nursing notes.  Pertinent labs & imaging results that were available during my care of the patient were reviewed by me and considered in my medical decision making (see chart for details).    MDM Rules/Calculators/A&P                      59 yo F with a cc of syncope and collapse.  This is been a recurrent issue for her.  She is on medications to increase her blood pressure, midodrine and Florinef.  She has been taking those as prescribed.  She denies taking any beta blocking agents and recently stopping them.  Denies any stimulants.  She is persistently tachycardic here with a heart rate in the 120s despite a liter of IV fluids.  Given a second liter with persistence.  Thyroid studies added on and are negative.  At this point will obtain a CT angiogram of the chest and a troponin and BNP.  If this were to persist and patient still be symptomatic then I feel she likely would need to be watched on telemetry in the hospital.  CRITICAL CARE Performed by: Cecilio Asper   Total critical care time: 35 minutes  Critical care time was exclusive of separately billable procedures and treating other patients.  Critical care was necessary to treat or prevent imminent or life-threatening deterioration.  Critical care was time spent personally by me on the following activities: development of treatment plan with patient and/or surrogate as well as nursing, discussions with consultants, evaluation of  patient's response to treatment, examination of patient, obtaining history from patient or surrogate, ordering and performing treatments and interventions, ordering and review of laboratory studies, ordering and review of radiographic studies, pulse oximetry and re-evaluation of patient's condition.  The patients results and plan were reviewed and discussed.   Any x-rays performed were independently reviewed by myself.   Differential diagnosis were considered with the presenting HPI.  Medications  sodium chloride 0.9 % bolus 1,000 mL (0 mLs Intravenous Stopped 09/05/19 2159)  sodium chloride 0.9 % bolus 1,000 mL (1,000 mLs Intravenous New Bag/Given 09/05/19 2210)  iohexol (OMNIPAQUE) 350 MG/ML injection 80 mL (80 mLs Intravenous Contrast Given 09/05/19 2324)    Vitals:   09/05/19 2200 09/05/19 2215 09/05/19 2230 09/05/19 2300  BP: 140/78 (!) 150/88 (!) 148/75 (!) 154/93  Pulse: (!) 121 (!) 121 (!) 123 (!) 122  Resp: 12 11 20  (!) 7  SpO2: 98% 98% 100% 99%  Weight:      Height:        Final diagnoses:  Syncope and collapse  Tachycardia    Admission/ observation were discussed with the admitting physician, patient and/or family and they are comfortable with the plan.    Final Clinical Impression(s) / ED Diagnoses Final diagnoses:  Syncope and collapse  Tachycardia    Rx / DC Orders ED Discharge Orders    None       Deno Etienne, DO 09/06/19 1210

## 2019-09-06 ENCOUNTER — Encounter (HOSPITAL_COMMUNITY): Payer: Self-pay | Admitting: Family Medicine

## 2019-09-06 DIAGNOSIS — E1165 Type 2 diabetes mellitus with hyperglycemia: Secondary | ICD-10-CM

## 2019-09-06 DIAGNOSIS — E876 Hypokalemia: Secondary | ICD-10-CM

## 2019-09-06 DIAGNOSIS — G903 Multi-system degeneration of the autonomic nervous system: Principal | ICD-10-CM

## 2019-09-06 DIAGNOSIS — R55 Syncope and collapse: Secondary | ICD-10-CM

## 2019-09-06 DIAGNOSIS — I951 Orthostatic hypotension: Secondary | ICD-10-CM

## 2019-09-06 DIAGNOSIS — R000 Tachycardia, unspecified: Secondary | ICD-10-CM | POA: Diagnosis present

## 2019-09-06 DIAGNOSIS — R918 Other nonspecific abnormal finding of lung field: Secondary | ICD-10-CM

## 2019-09-06 LAB — GLUCOSE, CAPILLARY
Glucose-Capillary: 138 mg/dL — ABNORMAL HIGH (ref 70–99)
Glucose-Capillary: 249 mg/dL — ABNORMAL HIGH (ref 70–99)

## 2019-09-06 LAB — CBC WITH DIFFERENTIAL/PLATELET
Abs Immature Granulocytes: 0.03 10*3/uL (ref 0.00–0.07)
Basophils Absolute: 0 10*3/uL (ref 0.0–0.1)
Basophils Relative: 0 %
Eosinophils Absolute: 0.1 10*3/uL (ref 0.0–0.5)
Eosinophils Relative: 2 %
HCT: 35.3 % — ABNORMAL LOW (ref 36.0–46.0)
Hemoglobin: 11.7 g/dL — ABNORMAL LOW (ref 12.0–15.0)
Immature Granulocytes: 0 %
Lymphocytes Relative: 23 %
Lymphs Abs: 1.8 10*3/uL (ref 0.7–4.0)
MCH: 31 pg (ref 26.0–34.0)
MCHC: 33.1 g/dL (ref 30.0–36.0)
MCV: 93.4 fL (ref 80.0–100.0)
Monocytes Absolute: 0.6 10*3/uL (ref 0.1–1.0)
Monocytes Relative: 7 %
Neutro Abs: 5.2 10*3/uL (ref 1.7–7.7)
Neutrophils Relative %: 68 %
Platelets: 199 10*3/uL (ref 150–400)
RBC: 3.78 MIL/uL — ABNORMAL LOW (ref 3.87–5.11)
RDW: 12.1 % (ref 11.5–15.5)
WBC: 7.7 10*3/uL (ref 4.0–10.5)
nRBC: 0 % (ref 0.0–0.2)

## 2019-09-06 LAB — BASIC METABOLIC PANEL
Anion gap: 11 (ref 5–15)
BUN: 7 mg/dL (ref 6–20)
CO2: 20 mmol/L — ABNORMAL LOW (ref 22–32)
Calcium: 8.2 mg/dL — ABNORMAL LOW (ref 8.9–10.3)
Chloride: 108 mmol/L (ref 98–111)
Creatinine, Ser: 0.55 mg/dL (ref 0.44–1.00)
GFR calc Af Amer: >60 mL/min (ref >60)
GFR calc non Af Amer: >60 mL/min (ref >60)
Glucose, Bld: 243 mg/dL — ABNORMAL HIGH (ref 70–99)
Potassium: 3.8 mmol/L (ref 3.5–5.1)
Sodium: 139 mmol/L (ref 135–145)

## 2019-09-06 LAB — SARS CORONAVIRUS 2 (TAT 6-24 HRS): SARS Coronavirus 2: NEGATIVE

## 2019-09-06 LAB — CBG MONITORING, ED
Glucose-Capillary: 294 mg/dL — ABNORMAL HIGH (ref 70–99)
Glucose-Capillary: 151 mg/dL — ABNORMAL HIGH (ref 70–99)

## 2019-09-06 LAB — RAPID URINE DRUG SCREEN, HOSP PERFORMED
Amphetamines: NOT DETECTED
Barbiturates: NOT DETECTED
Benzodiazepines: NOT DETECTED
Cocaine: NOT DETECTED
Opiates: NOT DETECTED
Tetrahydrocannabinol: NOT DETECTED

## 2019-09-06 LAB — TROPONIN I (HIGH SENSITIVITY): Troponin I (High Sensitivity): 8 ng/L (ref <18)

## 2019-09-06 LAB — HEMOGLOBIN A1C
Hgb A1c MFr Bld: 11.5 % — ABNORMAL HIGH (ref 4.8–5.6)
Mean Plasma Glucose: 283.35 mg/dL

## 2019-09-06 LAB — MAGNESIUM: Magnesium: 1.6 mg/dL — ABNORMAL LOW (ref 1.7–2.4)

## 2019-09-06 MED ORDER — MAGNESIUM SULFATE 4 GM/100ML IV SOLN
4.0000 g | Freq: Once | INTRAVENOUS | Status: AC
  Administered 2019-09-06: 4 g via INTRAVENOUS
  Filled 2019-09-06: qty 100

## 2019-09-06 MED ORDER — ONDANSETRON HCL 4 MG/2ML IJ SOLN: 4.0000 mg | Freq: Four times a day (QID) | INTRAMUSCULAR | Status: DC | PRN

## 2019-09-06 MED ORDER — MAGNESIUM OXIDE 400 (241.3 MG) MG PO TABS
400.0000 mg | ORAL_TABLET | Freq: Every day | ORAL | Status: DC
  Administered 2019-09-06: 400 mg via ORAL
  Filled 2019-09-06: qty 1

## 2019-09-06 MED ORDER — POTASSIUM CHLORIDE CRYS ER 20 MEQ PO TBCR
40.0000 meq | EXTENDED_RELEASE_TABLET | Freq: Once | ORAL | Status: AC
  Administered 2019-09-06: 40 meq via ORAL
  Filled 2019-09-06: qty 4

## 2019-09-06 MED ORDER — GABAPENTIN 300 MG PO CAPS
600.0000 mg | ORAL_CAPSULE | Freq: Two times a day (BID) | ORAL | Status: DC
  Administered 2019-09-06 – 2019-09-07 (×2): 600 mg via ORAL
  Filled 2019-09-06 (×2): qty 2

## 2019-09-06 MED ORDER — FLUDROCORTISONE ACETATE 0.1 MG PO TABS
0.1000 mg | ORAL_TABLET | Freq: Every day | ORAL | Status: DC
  Administered 2019-09-06: 0.1 mg via ORAL
  Filled 2019-09-06: qty 1

## 2019-09-06 MED ORDER — SODIUM CHLORIDE 0.9 % IV SOLN: INTRAVENOUS | Status: AC

## 2019-09-06 MED ORDER — INSULIN GLARGINE 100 UNIT/ML ~~LOC~~ SOLN: 15.0000 [IU] | Freq: Every day | SUBCUTANEOUS | Status: DC

## 2019-09-06 MED ORDER — INSULIN ASPART 100 UNIT/ML ~~LOC~~ SOLN
3.0000 [IU] | Freq: Three times a day (TID) | SUBCUTANEOUS | Status: DC
  Administered 2019-09-06 – 2019-09-07 (×5): 3 [IU] via SUBCUTANEOUS

## 2019-09-06 MED ORDER — INSULIN GLARGINE 100 UNIT/ML ~~LOC~~ SOLN
20.0000 [IU] | Freq: Every day | SUBCUTANEOUS | Status: DC
  Administered 2019-09-06 – 2019-09-07 (×2): 20 [IU] via SUBCUTANEOUS
  Filled 2019-09-06 (×2): qty 0.2

## 2019-09-06 MED ORDER — GABAPENTIN 600 MG PO TABS
1200.0000 mg | ORAL_TABLET | Freq: Every day | ORAL | Status: DC
  Administered 2019-09-06: 1200 mg via ORAL
  Filled 2019-09-06 (×2): qty 2

## 2019-09-06 MED ORDER — MIDODRINE HCL 5 MG PO TABS
10.0000 mg | ORAL_TABLET | Freq: Two times a day (BID) | ORAL | Status: DC
  Administered 2019-09-06 – 2019-09-07 (×3): 10 mg via ORAL
  Filled 2019-09-06 (×4): qty 2

## 2019-09-06 MED ORDER — INSULIN ASPART 100 UNIT/ML ~~LOC~~ SOLN
0.0000 [IU] | Freq: Three times a day (TID) | SUBCUTANEOUS | Status: DC
  Administered 2019-09-06: 2 [IU] via SUBCUTANEOUS
  Administered 2019-09-06: 8 [IU] via SUBCUTANEOUS
  Administered 2019-09-07: 3 [IU] via SUBCUTANEOUS
  Administered 2019-09-07: 12:00:00 5 [IU] via SUBCUTANEOUS

## 2019-09-06 MED ORDER — ACETAMINOPHEN 325 MG PO TABS: 650.0000 mg | ORAL_TABLET | Freq: Four times a day (QID) | ORAL | Status: DC | PRN

## 2019-09-06 MED ORDER — ONDANSETRON HCL 4 MG PO TABS: 4.0000 mg | ORAL_TABLET | Freq: Four times a day (QID) | ORAL | Status: DC | PRN

## 2019-09-06 MED ORDER — INSULIN GLARGINE 100 UNIT/ML ~~LOC~~ SOLN
15.0000 [IU] | Freq: Every day | SUBCUTANEOUS | Status: DC
  Filled 2019-09-06: qty 0.15

## 2019-09-06 MED ORDER — GABAPENTIN 300 MG PO CAPS: 600.0000 mg | ORAL_CAPSULE | Freq: Two times a day (BID) | ORAL | Status: DC

## 2019-09-06 MED ORDER — GABAPENTIN 600 MG PO TABS: 600.0000 mg | ORAL_TABLET | ORAL | Status: DC

## 2019-09-06 MED ORDER — ACETAMINOPHEN 650 MG RE SUPP: 650.0000 mg | Freq: Four times a day (QID) | RECTAL | Status: DC | PRN

## 2019-09-06 MED ORDER — DULOXETINE HCL 60 MG PO CPEP
60.0000 mg | ORAL_CAPSULE | Freq: Two times a day (BID) | ORAL | Status: DC
  Administered 2019-09-06 – 2019-09-07 (×3): 60 mg via ORAL
  Filled 2019-09-06 (×4): qty 1

## 2019-09-06 MED ORDER — ENOXAPARIN SODIUM 40 MG/0.4ML ~~LOC~~ SOLN
40.0000 mg | Freq: Every day | SUBCUTANEOUS | Status: DC
  Administered 2019-09-06 – 2019-09-07 (×2): 40 mg via SUBCUTANEOUS
  Filled 2019-09-06 (×3): qty 0.4

## 2019-09-06 MED ORDER — INSULIN ASPART 100 UNIT/ML ~~LOC~~ SOLN
0.0000 [IU] | Freq: Every day | SUBCUTANEOUS | Status: DC
  Administered 2019-09-06: 2 [IU] via SUBCUTANEOUS

## 2019-09-06 MED ORDER — SODIUM CHLORIDE 0.9 % IV BOLUS
500.0000 mL | Freq: Once | INTRAVENOUS | Status: AC
  Administered 2019-09-06: 500 mL via INTRAVENOUS

## 2019-09-06 MED ORDER — SODIUM CHLORIDE 0.9% FLUSH
3.0000 mL | Freq: Two times a day (BID) | INTRAVENOUS | Status: DC
  Administered 2019-09-06 – 2019-09-07 (×3): 3 mL via INTRAVENOUS

## 2019-09-06 MED ORDER — DULOXETINE HCL 60 MG PO CPEP: 60.0000 mg | ORAL_CAPSULE | Freq: Two times a day (BID) | ORAL | Status: DC

## 2019-09-06 NOTE — Progress Notes (Signed)
   09/06/19 1712  Orthostatic Lying   BP- Lying 120/70  Pulse- Lying 91  Orthostatic Sitting  BP- Sitting 123/78  Pulse- Sitting 105  Orthostatic Standing at 0 minutes  BP- Standing at 0 minutes 97/58  Pulse- Standing at 0 minutes 109  Orthostatic Standing at 3 minutes  BP- Standing at 3 minutes 117/71  Pulse- Standing at 3 minutes 106

## 2019-09-06 NOTE — Consult Note (Addendum)
Cardiology Consultation:   Patient ID: [name] [name]; [id number]; Jul 05, 1961   Admit date: 09/05/2019 Date of Consult: 09/06/2019  Primary Care Provider: Antony Blackbird, MD Primary Cardiologist: Quay Burow, MD Primary Electrophysiologist:  None  Chief Complaint: passed out  Patient Profile:   [name] [name] is a 59 y.o. female with a hx of uncontrolled IDDM with polyneuropathy, chronic orthostatic hypotension, depression, chronic pain, GERD, pancreatic cyst (benign by CT 03/2019), lack of insurance who is being seen today for the evaluation of syncope at the request of Dr. Myna Hidalgo.  History of Present Illness:   She has a history of issues with hypotension over 8 months. She also appears to have chronic sinus tachycardia on many EKGs going back the last few years. She saw Dr. Gwenlyn Found in the outpatient setting 06/29/2019 and Florinef was started. She was admitted 12/11-12/13/20 with near-syncope upon standing. Per her report her family had said she was able to respond some but was incoherent. Workup in the ED was notable for marked orthostasis with a BP drop of 40 points. She was therefore started on midodrine initially at 5mg  BID which was then titrated to 10mg  BID as her BP was still on the softer side. However, in talking to the patient it does not sound like she ever started it for unclear reasons. 2D 07/01/19 showed EF 60-65%, RV normal, trivial pericardial effusion, no significant valvular disease. She does recall being told she had adrenal fatigue but not clear where this came from. She has not been on any long term steroids. ACTH stim test from 12/2018 showed base cortisol 23.6, 30 min level of 32, and 60 min level of 34.1.  She presented back to the ED yesterday with syncope. Earlier in the day she saw her blood pressure was on the lower side and took her medication as usual. She does not recall the exact value but does state she typically runs 80s/50s in the morning. Later in the day while  talking to her son she was seeing white across her vision but could hear him normally. She reports this happens from time to time, either with white across her vision or little spots. Her blood pressure was still soft so she decided to take a second Florinef. Later in the day she was walking towards her bedroom and a family member observed that she leaned against a doorway and slid down. She was then unresponsive so her family moved her to her bed. She remembers feeling "bad" before this event but otherwise has no recollection of actually going down. No preceding or post-event CP, palpitations or dyspnea. She is unsure how long she was out for but she woke up unclear why she was on the bed. She checked CBG (54) and BP which was 69/40. She does report she had not been eating as much the last few days. They waited and rechecked BP which was 51/30s. Given the decline, she called EMS. In the ED she was noted to be persistently tachycardic (sinus, similar to prior baseline HR) and she was given IV fluids. CT angio showed no PE or acute process, did show scattered bilateral pulmonary nodules (infectious vs inflammatory) with follow-up recommended in 12 months if felt to be high risk. Labs revealed mild leukocytosis 10.8, normal hsTroponin, hypokalemia of 3.2, hypomagnesemia of 1.6 (both being repleted), TSH wnl, UDS negative. Orthostatic VS were notable for a drop in BP from 152/98 to 97/66 upon standing - this appears done after IVF.  Telemetry has been unremarkable except for  NSR/ST. HR is now in the 90s at rest. She denies any chest pain, SOB, palpitations. She does have a family history of brother dying suddenly at age 57. She was told the autopsy showed he had a massive MI and that his heart "exploded."  Past Medical History:  Diagnosis Date  . Cervical disc disorder with radiculopathy of cervical region   . Chronic low back pain    HNP  . Chronic pain syndrome   . Degenerative joint disease   . Depression  dx 1997  . Diabetes mellitus, type 2 ([place]) 2011   No insulin  . Diabetic polyneuropathy ([place])   . GERD (gastroesophageal reflux disease)   . Glaucoma   . Headache(784.0)   . Herniated disc   . Hypertension    Lab 11/2011:  CXR, EKG, CBC, TSH, BMet, troponin-normal; lipid profile: 188, 131, 36, 126   . Lumbar herniated disc   . Non-compliance   . Pancreatic cyst    Endoscopic aspiration in 09/2009  . Pneumonia 08/02/2012  . Shingles   . 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 Torn meniscus   . Vertigo     Past Surgical History:  Procedure Laterality Date  . ABDOMINAL HYSTERECTOMY    . CESAREAN SECTION       Inpatient Medications: Scheduled Meds: . DULoxetine  60 mg Oral BID  . enoxaparin (LOVENOX) injection  40 mg Subcutaneous Daily  . fludrocortisone  0.1 mg Oral Daily  . gabapentin  600 mg Oral BID  . gabapentin  1,200 mg Oral QHS  . insulin aspart  0-15 Units Subcutaneous TID WC  . insulin aspart  0-5 Units Subcutaneous QHS  . insulin aspart  3 Units Subcutaneous TID WC  . insulin glargine  20 Units Subcutaneous Daily  . midodrine  10 mg Oral BID WC  . sodium chloride flush  3 mL Intravenous Q12H   Continuous Infusions: . sodium chloride     PRN Meds: acetaminophen **OR** acetaminophen, ondansetron **OR** ondansetron (ZOFRAN) IV  Home Meds: Prior to Admission medications   Medication Sig Start Date End Date Taking? Authorizing Provider  acetaminophen (TYLENOL) 500 MG tablet Take 2 tablets (1,000 mg total) by mouth every 6 (six) hours as needed for mild pain or headache (pain). 07/02/19  Yes Domenic Polite, MD  DULoxetine (CYMBALTA) 60 MG capsule Take 1 capsule (60 mg total) by mouth 2 (two) times daily. 06/28/19  Yes Fulp, Cammie, MD  fludrocortisone (FLORINEF) 0.1 MG tablet Take 1 tablet (0.1 mg total) by mouth daily. 06/29/19  Yes Lorretta Harp, MD  gabapentin (NEURONTIN) 600 MG tablet Take 1-2 tablets (600-1,200 mg total) by mouth See admin  instructions. Take one tablet (600 mg) by mouth every morning and afternoon, take two tablets (1200 mg) at night 08/01/19  Yes Fulp, Cammie, MD  HUMALOG KWIKPEN 100 UNIT/ML KwikPen Inject 0.1 mLs (10 Units total) into the skin 3 (three) times daily. Patient taking differently: Inject 10 Units into the skin daily at 12 noon. Per sliding scale 03/15/19  Yes Fulp, Cammie, MD  Insulin Glargine (LANTUS SOLOSTAR) 100 UNIT/ML Solostar Pen Inject 50 Units into the skin daily. Patient taking differently: Inject 50 Units into the skin daily at 12 noon.  08/09/19  Yes Fulp, Cammie, MD  glucose blood test strip [employer] Testing Strips Use as instructed 08/18/17   Nuala Alpha, DO  Insulin Pen Needle (PEN NEEDLES) 31G X 8 MM MISC 1 Units by Does not apply  route daily. 08/20/17   Nuala Alpha, DO  midodrine (PROAMATINE) 10 MG tablet Take 1 tablet (10 mg total) by mouth 2 (two) times daily with a meal. Patient not taking: Reported on 09/06/2019 07/02/19   Domenic Polite, MD    Allergies:    Allergies  Allergen Reactions  . Tramadol Nausea And Vomiting    REACTION: Projectile vomiting    Social History:   Social History   Socioeconomic History  . Marital status: Single    Spouse name: Not on file  . Number of children: Not on file  . Years of education: Not on file  . Highest education level: Not on file  Occupational History  . Not on file  Tobacco Use  . Smoking status: Former Smoker    Quit date: 08/03/1978    Years since quitting: 41.1  . Smokeless tobacco: Never Used  . Tobacco comment: Smoked age [age]  Substance and Sexual Activity  . Alcohol use: No    Comment: Former  . Drug use: No  . Sexual activity: Yes    Birth control/protection: Surgical  Other Topics Concern  . 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    Social Determinants of Health   Financial Resource Strain:   . Difficulty of Paying Living Expenses: Not on file  Food  Insecurity:   . Worried About Charity fundraiser in the Last Year: Not on file  . Ran Out of Food in the Last Year: Not on file  Transportation Needs:   . Lack of Transportation (Medical): Not on file  . Lack of Transportation (Non-Medical): Not on file  Physical Activity:   . Days of Exercise per Week: Not on file  . Minutes of Exercise per Session: Not on file  Stress:   . Feeling of Stress : Not on file  Social Connections:   . Frequency of Communication with Friends and Family: Not on file  . Frequency of Social Gatherings with Friends and Family: Not on file  . Attends Religious Services: Not on file  . Active Member of Clubs or Organizations: Not on file  . Attends Archivist Meetings: Not on file  . Marital Status: Not on file  Intimate Partner Violence:   . Fear of Current or Ex-Partner: Not on file  . Emotionally Abused: Not on file  . Physically Abused: Not on file  . Sexually Abused: Not on file     Family History:   Family History  Problem Relation Age of Onset  . Depression Mother        type 1  . Diabetes Mother   . Renal Disease Mother   . Lung cancer Father   . Heart attack Brother        Died age 31 - was told due to massive MI/heart "exploded"  . Heart failure Maternal Grandmother        Details not totally clear      ROS:  Please see the history of present illness.  + chronic joint issues. + also reports tooth loss which she was told was related to degeneration of the bone that sits up All other ROS reviewed and negative.     Physical Exam/Data:   Vitals:   09/06/19 0600 09/06/19 0700 09/06/19 0800 09/06/19 1200  BP: 119/62 115/64 125/85 115/63  Pulse: (!) 117 (!) 111 (!) 129 (!) 109  Resp: 15 16 (!) 24 15  Temp:      TempSrc:      SpO2: 96% 96% 97% 98%  Weight:      Height:        Intake/Output Summary (Last 24 hours) at 09/06/2019 1414 Last data filed at 09/06/2019 1225 Gross per 24 hour  Intake 100 ml  Output --  Net 100  ml   Last 3 Weights 09/05/2019 08/09/2019 07/02/2019  Weight (lbs) 145 lb 138 lb 6.4 oz 144 lb 2.9 oz  Weight (kg) 65.772 kg 62.778 kg 65.4 kg     Body mass index is 23.4 kg/m.  General: Thin WF in no acute distress. Head: Normocephalic, atraumatic, sclera non-icteric, no xanthomas, nares are without discharge. Poor dentition Neck: Negative for carotid bruits. JVD not elevated. Lungs: Clear bilaterally to auscultation without wheezes, rales, or rhonchi. Breathing is unlabored. Heart: RRR with S1 S2. No murmurs, rubs, or gallops appreciated. Abdomen: Soft, non-tender, non-distended with normoactive bowel sounds. No hepatomegaly. No rebound/guarding. No obvious abdominal masses. Msk:  Strength and tone appear normal for age. Extremities: No clubbing or cyanosis. No edema.  Distal pedal pulses are 2+ and equal bilaterally. Neuro: Alert and oriented X 3. No facial asymmetry. No focal deficit. Moves all extremities spontaneously. Psych:  Responds to questions appropriately with a normal affect.  EKG:  The EKG was personally reviewed and demonstrates: sinus tachycardia, 124bpm, no acute STT changes  Telemetry:  Telemetry was personally reviewed and demonstrates:  NSR/ST  Relevant CV Studies: 2D Echo 06/2019 1. Left ventricular ejection fraction, by visual estimation, is 60 to  65%. The left ventricle has normal function. There is no left ventricular  hypertrophy.  2. Indeterminate diastolic filling due to E-A fusion.  3. The left ventricle has no regional wall motion abnormalities.  4. Global right ventricle has normal systolic function.The right  ventricular size is normal. No increase in right ventricular wall  thickness.  5. Left atrial size was normal.  6. Right atrial size was normal.  7. The pericardial effusion is circumferential.  8. Trivial pericardial effusion is present.  9. The mitral valve is normal in structure. No evidence of mitral valve  regurgitation. No  evidence of mitral stenosis.  10. The tricuspid valve is normal in structure. Tricuspid valve  regurgitation is not demonstrated.  11. The aortic valve is tricuspid. Aortic valve regurgitation is not  visualized. No evidence of aortic valve sclerosis or stenosis.  12. The pulmonic valve was not well visualized. Pulmonic valve  regurgitation is not visualized.   Laboratory Data:  High Sensitivity Troponin:   Recent Labs  Lab 09/06/19 0011  TROPONINIHS 8     Cardiac EnzymesNo results for input(s): TROPONINI in the last 168 hours. No results for input(s): TROPIPOC in the last 168 hours.  Chemistry Recent Labs  Lab 09/05/19 2045 09/06/19 0947  NA 141 139  K 3.2* 3.8  CL 103 108  CO2 25 20*  GLUCOSE 197* 243*  BUN 18 7  CREATININE 0.66 0.55  CALCIUM 9.5 8.2*  GFRNONAA >60 >60  GFRAA >60 >60  ANIONGAP 13 11    No results for input(s): PROT, ALBUMIN, AST, ALT, ALKPHOS, BILITOT in the last 168 hours. Hematology Recent Labs  Lab 09/05/19 2045 09/06/19 0400  WBC 10.8* 7.7  RBC 4.38 3.78*  HGB 13.8 11.7*  HCT 40.0 35.3*  MCV 91.3 93.4  MCH 31.5 31.0  MCHC 34.5 33.1  RDW 12.1 12.1  PLT 245 199   BNPNo results for input(s): BNP, PROBNP in  the last 168 hours.  DDimer No results for input(s): DDIMER in the last 168 hours.   Radiology/Studies:  CT Angio Chest PE W and/or Wo Contrast  Result Date: 09/05/2019 CLINICAL DATA:  Syncope. EXAM: CT ANGIOGRAPHY CHEST WITH CONTRAST TECHNIQUE: Multidetector CT imaging of the chest was performed using the standard protocol during bolus administration of intravenous contrast. Multiplanar CT image reconstructions and MIPs were obtained to evaluate the vascular anatomy. CONTRAST:  80 mL of Omnipaque 350. COMPARISON:  None. FINDINGS: Cardiovascular: Contrast injection is sufficient to demonstrate satisfactory opacification of the pulmonary arteries to the segmental level. There is no pulmonary embolus. The main pulmonary artery is within  normal limits for size. There is no CT evidence of acute right heart strain. The visualized aorta is normal. Heart size is normal, without pericardial effusion. Mediastinum/Nodes: --No mediastinal or hilar lymphadenopathy. --No axillary lymphadenopathy. --No supraclavicular lymphadenopathy. --Normal thyroid gland. --The esophagus is unremarkable Lungs/Pleura: There are few scattered 3-4 mm pulmonary nodules in the right upper lobe (axial series 6, image 15 and 26). There is a cluster of micro nodules in the right upper lobe (axial series 6, image 47). There is a 3-4 mm pulmonary nodule in the anterior left upper lobe (axial series 6, image 36). Upper Abdomen: No acute abnormality. Musculoskeletal: No chest wall abnormality. No acute or significant osseous findings. Review of the MIP images confirms the above findings. IMPRESSION: 1. No acute pulmonary embolism. 2. No acute cardiopulmonary process. 3. Scattered bilateral pulmonary nodules all measuring less than approximately 5 mm. These are favored to represent an infectious or inflammatory process. No follow-up needed if patient is low-risk (and has no known or suspected primary neoplasm). Non-contrast chest CT can be considered in 12 months if patient is high-risk. This recommendation follows the consensus statement: Guidelines for Management of Incidental Pulmonary Nodules Detected on CT Images: From the Fleischner Society 2017; Radiology 2017; 284:228-243. Electronically Signed   By: Constance Holster M.D.   On: 09/05/2019 23:50    Assessment and Plan:   1. Syncope with documented orthostasis after admission- suspect related to autonomic neuropathy in the context of uncontrolled DM. She was taking Florinef but did not actually start the midodrine that was prescribed in 06/2019. She isn't quite sure why but says she overall felt the Florinef had been going to a good job. She does report chronically low BP in the morning of 80s/50s so does not have much  reserve to begin with. Would suggest initiation of midodrine as previously outlined. Hopefully this will allow her to have a more normal blood pressure as she starts the day. I will review plan with MD. I also relayed question to IM about the interpretation of her 12/2018 ACTH stim test results as the patient had reported a history of "adrenal fatigue."  2. Sinus tachycardia - this appears to be a chronic problem for patient, perhaps inappropriate sinus tach. She has had a good workup already looking at the obvious causes such as anemia, hyperthyroidism, pericardial effusion, UDS, etc, without obvious cause. There is evidence of this baseline HR back to 2018.  3. Uncontrolled DM - DM coordinator has been consulted and will be involved in care. She has had both uncontrolled blood sugars but also reports a CBG of 54 around time of event. This likely contributed to the syncope as well.  4. Hypokalemia/hypomagnesemia - etiology not totally clear, not really on any agents that would cause this. Further repletion/management per primary team. She does not appear to be  chronically hypokalemic.  Dispo: have arranged OP f/u on 3/1 at 2:15pm with our office, will add info to AVS and request TOC call. She was also advised no driving until seen/cleared in follow-up - will need discussion with primary cardiologist upon follow-up assessment.  For questions or updates, please contact [place] Please consult [email or web address] for contact info under    Signed, Charlie Pitter, PA-C  09/06/2019 2:14 PM   I have examined the patient and reviewed assessment and plan and discussed with patient.  Agree with above as stated.  Syncope likely from orthostatic hypotension and low blood sugar. Sinus tach appears compensatory.    Add midodrine as previously recommended.  Hopefully this helps reduce orthostatic BP changes. Continue fluorinef.  Stay well hydrated.  No driving until she is seen back in the office.   She had not  been eating regularly and blood sugar dropped. Difficult to control diabetes is also an issue for her.   Larae Grooms

## 2019-09-06 NOTE — ED Notes (Signed)
Pt ambulated to restroom without difficulty and denies dizziness. BP WNL upon getting back to bed.

## 2019-09-06 NOTE — Progress Notes (Signed)
Inpatient Diabetes Program Recommendations  AACE/ADA: New Consensus Statement on Inpatient Glycemic Control (2015)  Target Ranges:  Prepandial:   less than 140 mg/dL      Peak postprandial:   less than 180 mg/dL (1-2 hours)      Critically ill patients:  140 - 180 mg/dL   Lab Results  Component Value Date   GLUCAP 151 (H) 09/06/2019   HGBA1C 13.1 (A) 06/28/2019    Review of Glycemic Control  Results for CARYOL, SHANE (MRN [id number]) as of 09/06/2019 14:36  Ref. Range 09/05/2019 20:44 09/06/2019 07:49 09/06/2019 12:38  Glucose-Capillary Latest Ref Range: 70 - 99 mg/dL 181 (H) 294 (H) 151 (H)    Diabetes history: DM2 Outpatient Diabetes medications: Lantus 50 units daily + Humalog SSI TID with meals Current orders for Inpatient glycemic control: Lantus 20 units daily + Novolog 0-15 units TID + 0-5 units QHS + Novolog 3 units TID with meals  Note:  Spoke with patient at bedside.  Reviewed patient's current A1c of 13.1%. Explained what a A1c is and what it measures. Also reviewed goal A1c with patient, importance of good glucose control @ home, and blood sugar goals.  Confirmed above home medications.  Patient denies difficulty obtaining medications.  She does not drink any sugary drink.  She sates she tries to watch her CHO intake.  Current with Dr. Chapman Fitch.  Checks her blood sugar 3 times a day.  She states it usually reads in the 200's.  Reviewed Plate Method and CHO's.  Reviewed hypoglycemia and treatment.    Thank you, Reche Dixon, RN, BSN Diabetes Coordinator Inpatient Diabetes Program [phone] (team pager from 8a-5p)

## 2019-09-06 NOTE — H&P (Signed)
History and Physical    [name] [name] [id number] DOB: 01-Mar-1961 DOA: 09/05/2019  PCP: Antony Blackbird, MD   Patient coming from: Home   Chief Complaint: Fatigue, syncope   HPI: [name] [name] is a 59 y.o. female with medical history significant for recurrent syncopal episode suspected secondary to autonomic dysfunction, uncontrolled insulin-dependent diabetes mellitus, depression, and chronic pain, now presenting to the emergency department after a syncopal episode at home.  Patient reports that she had not been eating or drinking much for the past couple days, but continued to feel well until noting some increased fatigue and low blood pressures today.  She took her usual medications that include midodrine and Florinef, reports continued hypotension despite this, took a second dose of her medications, and then reports that she woke up in her bed but unsure of how she got there.  She states that her son had witnessed a syncopal episode and carried her to her bed.  She was not experiencing any chest pain, palpitations, shortness of breath, or headache.  She denies any recent fevers, chills, cough, shortness of breath, or sore throat.  She denies any illicit drug use.  ED Course: Upon arrival to the ED, patient is found to be tachycardic in the 120s with stable blood pressure.  EKG features sinus tachycardia with rate 126 and QTc interval of 496 ms.  CTA chest is negative for PE, negative for acute cardiopulmonary disease, but notable for scattered small nodules.  Chemistry panel features a glucose of 197 and potassium 3.2.  CBC with mild leukocytosis.  TSH and free T4 are normal.  Urinalysis with glucosuria and ketonuria.  Patient was given 2 L normal saline in the ED, reports feeling much better after this, but continues to have heart rate sustaining in the 120s and hospitalists are asked to admit.  Review of Systems:  All other systems reviewed and apart from HPI, are negative.  Past Medical History:   Diagnosis Date   Cervical disc disorder with radiculopathy of cervical region    Chest pain    Chronic low back pain    HNP   Chronic pain syndrome    Depression dx 1997   Depression    Diabetes mellitus, type 2 ([place]) 2011   No insulin   Diabetic polyneuropathy (HCC)    GERD (gastroesophageal reflux disease)    Glaucoma    Headache(784.0)    Herniated disc    Hypertension    Lab 11/2011:  CXR, EKG, CBC, TSH, BMet, troponin-normal; lipid profile: 188, 131, 36, 126    Non-compliance    Pancreatic cyst    Endoscopic aspiration in 09/2009   Pneumonia 08/02/2012   Shingles    Vertigo     Past Surgical History:  Procedure Laterality Date   ABDOMINAL HYSTERECTOMY     CESAREAN SECTION       reports that she quit smoking about 41 years ago. She has never used smokeless tobacco. She reports that she does not drink alcohol or use drugs.  Allergies  Allergen Reactions   Tramadol Nausea And Vomiting    REACTION: Projectile vomiting    Family History  Problem Relation Age of Onset   Depression Mother        type 1   Diabetes Mother    Renal Disease Mother    Lung cancer Father      Prior to Admission medications   Medication Sig Start Date End Date Taking? Authorizing Provider  acetaminophen (TYLENOL) 500 MG tablet Take 2  tablets (1,000 mg total) by mouth every 6 (six) hours as needed for mild pain or headache (pain). 07/02/19   Domenic Polite, MD  DULoxetine (CYMBALTA) 60 MG capsule Take 1 capsule (60 mg total) by mouth 2 (two) times daily. 06/28/19   Fulp, Cammie, MD  fludrocortisone (FLORINEF) 0.1 MG tablet Take 1 tablet (0.1 mg total) by mouth daily. 06/29/19   Lorretta Harp, MD  gabapentin (NEURONTIN) 600 MG tablet Take 1-2 tablets (600-1,200 mg total) by mouth See admin instructions. Take one tablet (600 mg) by mouth every morning and afternoon, take two tablets (1200 mg) at night 08/01/19   Fulp, Cammie, MD  glucose blood test strip [facility]  Testing Strips Use as instructed 08/18/17   Lockamy, Kennth Vanbenschoten, DO  HUMALOG KWIKPEN 100 UNIT/ML KwikPen Inject 0.1 mLs (10 Units total) into the skin 3 (three) times daily. 03/15/19   Fulp, Cammie, MD  Insulin Glargine (LANTUS SOLOSTAR) 100 UNIT/ML Solostar Pen Inject 50 Units into the skin daily. 08/09/19   Fulp, Cammie, MD  Insulin Pen Needle (PEN NEEDLES) 31G X 8 MM MISC 1 Units by Does not apply route daily. 08/20/17   Nuala Alpha, DO  midodrine (PROAMATINE) 10 MG tablet Take 1 tablet (10 mg total) by mouth 2 (two) times daily with a meal. 07/02/19   Domenic Polite, MD    Physical Exam: Vitals:   09/05/19 2215 09/05/19 2230 09/05/19 2300 09/06/19 0101  BP: (!) 150/88 (!) 148/75 (!) 154/93 (!) 147/92  Pulse: (!) 121 (!) 123 (!) 122 (!) 120  Resp: 11 20 (!) 7 11  SpO2: 98% 100% 99% 98%  Weight:      Height:         Constitutional: NAD, calm  Eyes: PERTLA, lids and conjunctivae normal ENMT: Mucous membranes are moist. Posterior pharynx clear of any exudate or lesions.   Neck: normal, supple, no masses, no thyromegaly Respiratory: clear to auscultation bilaterally, no wheezing, no crackles. No accessory muscle use.  Cardiovascular: Rate ~120 and regular. No extremity edema.   Abdomen: No distension, no tenderness, soft. Bowel sounds active.  Musculoskeletal: no clubbing / cyanosis. No joint deformity upper and lower extremities.   Skin: no significant rashes, lesions, ulcers. Warm, dry, well-perfused. Neurologic: no facial asymmetry. Sensation intact. Moving all extremities.  Psychiatric: Alert and oriented to person, place, and situation. Calm and cooperative.    Labs and Imaging on Admission: I have personally reviewed following labs and imaging studies  CBC: Recent Labs  Lab 09/05/19 2045  WBC 10.8*  HGB 13.8  HCT 40.0  MCV 91.3  PLT 99991111   Basic Metabolic Panel: Recent Labs  Lab 09/05/19 2045  NA 141  K 3.2*  CL 103  CO2 25  GLUCOSE 197*  BUN 18  CREATININE  0.66  CALCIUM 9.5   GFR: Estimated Creatinine Clearance: 70.9 mL/min (by C-G formula based on SCr of 0.66 mg/dL). Liver Function Tests: No results for input(s): AST, ALT, ALKPHOS, BILITOT, PROT, ALBUMIN in the last 168 hours. No results for input(s): LIPASE, AMYLASE in the last 168 hours. No results for input(s): AMMONIA in the last 168 hours. Coagulation Profile: No results for input(s): INR, PROTIME in the last 168 hours. Cardiac Enzymes: No results for input(s): CKTOTAL, CKMB, CKMBINDEX, TROPONINI in the last 168 hours. BNP (last 3 results) No results for input(s): PROBNP in the last 8760 hours. HbA1C: No results for input(s): HGBA1C in the last 72 hours. CBG: Recent Labs  Lab 09/05/19 2044  GLUCAP 181*  Lipid Profile: No results for input(s): CHOL, HDL, LDLCALC, TRIG, CHOLHDL, LDLDIRECT in the last 72 hours. Thyroid Function Tests: Recent Labs    09/05/19 2205  TSH 0.794  FREET4 0.96   Anemia Panel: No results for input(s): VITAMINB12, FOLATE, FERRITIN, TIBC, IRON, RETICCTPCT in the last 72 hours. Urine analysis:    Component Value Date/Time   COLORURINE YELLOW 09/05/2019 2030   APPEARANCEUR HAZY (A) 09/05/2019 2030   APPEARANCEUR Clear 03/15/2019 1056   LABSPEC 1.022 09/05/2019 2030   PHURINE 5.0 09/05/2019 2030   GLUCOSEU >=500 (A) 09/05/2019 2030   [place] NEGATIVE 09/05/2019 2030   [place] NEGATIVE 09/05/2019 2030   BILIRUBINUR negative 08/09/2019 1720   BILIRUBINUR Negative 03/15/2019 1056   KETONESUR 20 (A) 09/05/2019 2030   PROTEINUR NEGATIVE 09/05/2019 2030   UROBILINOGEN 0.2 08/09/2019 1720   UROBILINOGEN 0.2 11/12/2013 1755   NITRITE NEGATIVE 09/05/2019 2030   LEUKOCYTESUR NEGATIVE 09/05/2019 2030   Sepsis Labs: @LABRCNTIP (procalcitonin:4,lacticidven:4) )No results found for this or any previous visit (from the past 240 hour(s)).   Radiological Exams on Admission: CT Angio Chest PE W and/or Wo Contrast  Result Date: 09/05/2019 CLINICAL  DATA:  Syncope. EXAM: CT ANGIOGRAPHY CHEST WITH CONTRAST TECHNIQUE: Multidetector CT imaging of the chest was performed using the standard protocol during bolus administration of intravenous contrast. Multiplanar CT image reconstructions and MIPs were obtained to evaluate the vascular anatomy. CONTRAST:  80 mL of Omnipaque 350. COMPARISON:  None. FINDINGS: Cardiovascular: Contrast injection is sufficient to demonstrate satisfactory opacification of the pulmonary arteries to the segmental level. There is no pulmonary embolus. The main pulmonary artery is within normal limits for size. There is no CT evidence of acute right heart strain. The visualized aorta is normal. Heart size is normal, without pericardial effusion. Mediastinum/Nodes: --No mediastinal or hilar lymphadenopathy. --No axillary lymphadenopathy. --No supraclavicular lymphadenopathy. --Normal thyroid gland. --The esophagus is unremarkable Lungs/Pleura: There are few scattered 3-4 mm pulmonary nodules in the right upper lobe (axial series 6, image 15 and 26). There is a cluster of micro nodules in the right upper lobe (axial series 6, image 47). There is a 3-4 mm pulmonary nodule in the anterior left upper lobe (axial series 6, image 36). Upper Abdomen: No acute abnormality. Musculoskeletal: No chest wall abnormality. No acute or significant osseous findings. Review of the MIP images confirms the above findings. IMPRESSION: 1. No acute pulmonary embolism. 2. No acute cardiopulmonary process. 3. Scattered bilateral pulmonary nodules all measuring less than approximately 5 mm. These are favored to represent an infectious or inflammatory process. No follow-up needed if patient is low-risk (and has no known or suspected primary neoplasm). Non-contrast chest CT can be considered in 12 months if patient is high-risk. This recommendation follows the consensus statement: Guidelines for Management of Incidental Pulmonary Nodules Detected on CT Images: From the  Fleischner Society 2017; Radiology 2017; 284:228-243. Electronically Signed   By: Constance Holster M.D.   On: 09/05/2019 23:50    EKG: Independently reviewed. Sinus tachycardia, rate 126, QTc 496 ms.   Assessment/Plan   1. Recurrent syncope  - Presents after a reported syncopal episode at home, found to be tachycardic to 120's in ED with stable BP, denies any prodrome, had echo in December, and CTA chest in ED today negative for PE  - She has had recurrent syncope that was suspected secondary to autonomic dysfunction, reports no events in last 2 months while on midodrine and Florinef, had not eaten or drank much in last couple days  -  May have been orthostatic and secondary to hypovolemia on top of her autonomic dysfunction as she feels much better after IVF in ED but HR remains in 120's  - Monitor on telemetry overnight, check orthostatic vitals though she has already been fluid-resuscitated in ED, continue IVF hydration, replace electrolytes    2. Uncontrolled insulin-dependent DM  - A1c was 13.1% in December 2020  - Continue CBG checks and insulin   3. Hypokalemia  - Serum potassium 3.2 in ED  - Replaced, repeat chem panel in am    4. Pulmonary nodules  - Scattered nodules <5 mm noted incidentally on CTA chest  - She denies smoking hx, cancer hx, or any recent respiratory sxs     DVT prophylaxis: Lovenox  Code Status: Full  Family Communication: Discussed with patient  Disposition Plan: Likely back home pending stable BP and HR  Consults called: None  Admission status: Observation     Vianne Bulls, MD [facility] Pager: See [email or web address]  If 7AM-7PM, please contact the daytime attending [email or web address]  09/06/2019, 1:28 AM

## 2019-09-06 NOTE — ED Notes (Signed)
Notified RN of CBGs results

## 2019-09-06 NOTE — ED Notes (Signed)
Ordered diet tray 

## 2019-09-06 NOTE — Progress Notes (Signed)
Progress Note    [name] [name]  [id number] DOB: 06-16-1961  DOA: 09/05/2019 PCP: Antony Blackbird, MD    Brief Narrative:   Chief complaint: syncope, hypotension, hypoglycemia  Medical records reviewed and are as summarized below:  [name] [name] is an 59 y.o. female with a past medical history significant for neurogenic orthostatic hypotension, diabetes uncontrolled, hypertension, cervical radiculopathy, chronic pain syndrome, GERD, depression presents to the emergency department February 17 chief complaint syncope with hypoglycemia and hypotension.  Work-up in the emergency department reveals tachycardia, orthostatic hypotension hyperkalemia.  Assessment/Plan:   Principal Problem:   Recurrent syncope Active Problems:   Diabetes type 2, uncontrolled (HCC)   Hypotension   Sinus tachycardia   Hypokalemia   Pulmonary nodules  #1.  Recurrent syncope.  Likely multifactorial specific ongoing neurogenic orthostatic hypotension in the setting of hypoglycemia secondary to decreased oral intake and continuation of insulin decreased volume due to dehydration.  Patient was provided with 2 L of normal saline and her blood pressure improved.  Orthostatic vital signs were positive.  Home medications include florinef and midodrine -resume home meds -continue IV fluids -TED hose -recheck orthostatic  #2.  Sinus tachycardia.  Chest pain.  TSH within the limits of normal.  She had a CT angiogram of the chest no acute pulmonary embolism, no acute cardiopulmonary process, she is not hypoxic.  Chart review indicates her last hospitalization she had the same.  Discharge summary notes she was discharged her heart rate at that time was 108.  Currently her heart rate ranges one 110-125. -We'll give her another 500 cc bolus of IV fluids -Consider cardiology consult  #3.  Hypokalemia. repleted and resolved -monitor  #4.  Diabetes type 2.  Uncontrolled.  Hemoglobin A1c 13.1 in December.  Home regimen  includes Lantus 50 units as well as sliding scale at mealtime.  She reports she takes her insulin no matter what her p.o. intake has been.  EMS reported a CBG in the fifties during incident.  This was treated and on admission serum glucose 197.  This morning serum glucose 243. -We'll resume home Lantus at 20 units -Continue sliding scale -Request diabetes coordinator consult  #5.  Pulmonary nodules.  Incidental finding on CT.  Scattered bilateral pulmonary nodules all measuring less than approximately 5 mm favored to represent an infectious or inflammatory process.  No follow-up needed if patient low risk noncontrast chest CT consider 12 months if high risk.   Family Communication/Anticipated D/C date and plan/Code Status   DVT prophylaxis: Lovenox ordered. Code Status: Full Code.  Family Communication: patient at bedside Disposition Plan: home   Medical Consultants:    cardiology   Anti-Infectives:    None  Subjective:   Sitting up in bed watching TV.  Denies pain/dizziness/discomfort.  Has eaten all of her breakfast  Objective:    Vitals:   09/06/19 0500 09/06/19 0600 09/06/19 0700 09/06/19 0800  BP: 120/64 119/62 115/64 125/85  Pulse: (!) 117 (!) 117 (!) 111 (!) 129  Resp: 15 15 16  (!) 24  Temp:      TempSrc:      SpO2: 96% 96% 96% 97%  Weight:      Height:       No intake or output data in the 24 hours ending 09/06/19 1031 Filed Weights   09/05/19 2028  Weight: 65.8 kg    Exam: General: Petite lady who appears chronically ill alert cooperative poor dentition no acute distress CV: Tachycardic but regular.  I hear  no murmur gallop or rub.  No lower extremity edema Respiratory: No increased work of breathing breath sounds are distant but clear bilaterally I hear no wheeze no rhonchi no crackles Abdomen soft positive bowel sounds throughout nontender to palpation no guarding or rebounding Musculoskeletal: Joints without swelling/erythema Neuro: Awake alert  oriented x3 speech clear facial symmetry cranial nerves II through XII grossly intact  Data Reviewed:   I have personally reviewed following labs and imaging studies:  Labs: Labs show the following:   Basic Metabolic Panel: Recent Labs  Lab 09/05/19 October 21, 2043 09/06/19 0400 09/06/19 0947  NA 141  --  139  K 3.2*  --  3.8  CL 103  --  108  CO2 25  --  20*  GLUCOSE 197*  --  243*  BUN 18  --  7  CREATININE 0.66  --  0.55  CALCIUM 9.5  --  8.2*  MG  --  1.6*  --    GFR Estimated Creatinine Clearance: 70.9 mL/min (by C-G formula based on SCr of 0.55 mg/dL). Liver Function Tests: No results for input(s): AST, ALT, ALKPHOS, BILITOT, PROT, ALBUMIN in the last 168 hours. No results for input(s): LIPASE, AMYLASE in the last 168 hours. No results for input(s): AMMONIA in the last 168 hours. Coagulation profile No results for input(s): INR, PROTIME in the last 168 hours.  CBC: Recent Labs  Lab 09/05/19 10/21/2043 09/06/19 0400  WBC 10.8* 7.7  NEUTROABS  --  5.2  HGB 13.8 11.7*  HCT 40.0 35.3*  MCV 91.3 93.4  PLT 245 199   Cardiac Enzymes: No results for input(s): CKTOTAL, CKMB, CKMBINDEX, TROPONINI in the last 168 hours. BNP (last 3 results) No results for input(s): PROBNP in the last 8760 hours. CBG: Recent Labs  Lab 09/05/19 10-21-2042 09/06/19 0749  GLUCAP 181* 294*   D-Dimer: No results for input(s): DDIMER in the last 72 hours. Hgb A1c: No results for input(s): HGBA1C in the last 72 hours. Lipid Profile: No results for input(s): CHOL, HDL, LDLCALC, TRIG, CHOLHDL, LDLDIRECT in the last 72 hours. Thyroid function studies: Recent Labs    09/05/19 2203-10-21  TSH 0.794   Anemia work up: No results for input(s): VITAMINB12, FOLATE, FERRITIN, TIBC, IRON, RETICCTPCT in the last 72 hours. Sepsis Labs: Recent Labs  Lab 09/05/19 10/21/43 09/06/19 0400  WBC 10.8* 7.7    Microbiology Recent Results (from the past 240 hour(s))  SARS CORONAVIRUS 2 (TAT 6-24 HRS) Nasopharyngeal  Nasopharyngeal Swab     Status: None   Collection Time: 09/06/19 12:36 AM   Specimen: Nasopharyngeal Swab  Result Value Ref Range Status   SARS Coronavirus 2 NEGATIVE NEGATIVE Final    Comment: (NOTE) SARS-CoV-2 target nucleic acids are NOT DETECTED. The SARS-CoV-2 RNA is generally detectable in upper and lower respiratory specimens during the acute phase of infection. Negative results do not preclude SARS-CoV-2 infection, do not rule out co-infections with other pathogens, and should not be used as the sole basis for treatment or other patient management decisions. Negative results must be combined with clinical observations, patient history, and epidemiological information. The expected result is Negative. Fact Sheet for Patients: SugarRoll.be Fact Sheet for Healthcare Providers: https://www.woods-mathews.com/ This test is not yet approved or cleared by the [place] FDA and  has been authorized for detection and/or diagnosis of SARS-CoV-2 by FDA under an Emergency Use Authorization (EUA). This EUA will remain  in effect (meaning this test can be used) for the duration of the COVID-19 declaration under  Section 56 4(b)(1) of the Act, 21 U.S.C. section 360bbb-3(b)(1), unless the authorization is terminated or revoked sooner. Performed at [facility], [place] [address], [place], [place] [address]     Procedures and diagnostic studies:  CT Angio Chest PE W and/or Wo Contrast  Result Date: 09/05/2019 CLINICAL DATA:  Syncope. EXAM: CT ANGIOGRAPHY CHEST WITH CONTRAST TECHNIQUE: Multidetector CT imaging of the chest was performed using the standard protocol during bolus administration of intravenous contrast. Multiplanar CT image reconstructions and MIPs were obtained to evaluate the vascular anatomy. CONTRAST:  80 mL of Omnipaque 350. COMPARISON:  None. FINDINGS: Cardiovascular: Contrast injection is sufficient to demonstrate satisfactory  opacification of the pulmonary arteries to the segmental level. There is no pulmonary embolus. The main pulmonary artery is within normal limits for size. There is no CT evidence of acute right heart strain. The visualized aorta is normal. Heart size is normal, without pericardial effusion. Mediastinum/Nodes: --No mediastinal or hilar lymphadenopathy. --No axillary lymphadenopathy. --No supraclavicular lymphadenopathy. --Normal thyroid gland. --The esophagus is unremarkable Lungs/Pleura: There are few scattered 3-4 mm pulmonary nodules in the right upper lobe (axial series 6, image 15 and 26). There is a cluster of micro nodules in the right upper lobe (axial series 6, image 47). There is a 3-4 mm pulmonary nodule in the anterior left upper lobe (axial series 6, image 36). Upper Abdomen: No acute abnormality. Musculoskeletal: No chest wall abnormality. No acute or significant osseous findings. Review of the MIP images confirms the above findings. IMPRESSION: 1. No acute pulmonary embolism. 2. No acute cardiopulmonary process. 3. Scattered bilateral pulmonary nodules all measuring less than approximately 5 mm. These are favored to represent an infectious or inflammatory process. No follow-up needed if patient is low-risk (and has no known or suspected primary neoplasm). Non-contrast chest CT can be considered in 12 months if patient is high-risk. This recommendation follows the consensus statement: Guidelines for Management of Incidental Pulmonary Nodules Detected on CT Images: From the Fleischner Society 2017; Radiology 2017; 284:228-243. Electronically Signed   By: Constance Holster M.D.   On: 09/05/2019 23:50    Medications:   . DULoxetine  60 mg Oral BID  . enoxaparin (LOVENOX) injection  40 mg Subcutaneous Daily  . fludrocortisone  0.1 mg Oral Daily  . gabapentin  600 mg Oral BID  . gabapentin  1,200 mg Oral QHS  . insulin aspart  0-15 Units Subcutaneous TID WC  . insulin aspart  0-5 Units  Subcutaneous QHS  . insulin aspart  3 Units Subcutaneous TID WC  . insulin glargine  15 Units Subcutaneous QHS  . magnesium oxide  400 mg Oral Daily  . midodrine  10 mg Oral BID WC  . sodium chloride flush  3 mL Intravenous Q12H   Continuous Infusions: . sodium chloride 125 mL/hr at 09/06/19 0405  . magnesium sulfate bolus IVPB       LOS: 0 days   Radene Gunning NP [facility]   How to contact the [facility] Attending or Consulting provider [place] or covering provider during after hours [place], for this patient?  1. Check the care team in [facility] and look for a) attending/consulting [facility] provider listed and b) the [facility] team listed 2. Log into [email or web address] and use [place]'s universal password to access. If you do not have the password, please contact the hospital operator. 3. Locate the [facility] provider you are looking for under [facility] and page to a number that you can be  directly reached. 4. If you still have difficulty reaching the provider, please page the [facility] (Director on Call) for the Hospitalists listed on amion for assistance.  09/06/2019, 10:31 AM

## 2019-09-07 DIAGNOSIS — E2740 Unspecified adrenocortical insufficiency: Secondary | ICD-10-CM

## 2019-09-07 LAB — CBC
HCT: 35.4 % — ABNORMAL LOW (ref 36.0–46.0)
Hemoglobin: 11.9 g/dL — ABNORMAL LOW (ref 12.0–15.0)
MCH: 31.2 pg (ref 26.0–34.0)
MCHC: 33.6 g/dL (ref 30.0–36.0)
MCV: 92.9 fL (ref 80.0–100.0)
Platelets: 185 10*3/uL (ref 150–400)
RBC: 3.81 MIL/uL — ABNORMAL LOW (ref 3.87–5.11)
RDW: 12.4 % (ref 11.5–15.5)
WBC: 4.6 10*3/uL (ref 4.0–10.5)
nRBC: 0 % (ref 0.0–0.2)

## 2019-09-07 LAB — BASIC METABOLIC PANEL
Anion gap: 9 (ref 5–15)
BUN: 8 mg/dL (ref 6–20)
CO2: 23 mmol/L (ref 22–32)
Calcium: 8.3 mg/dL — ABNORMAL LOW (ref 8.9–10.3)
Chloride: 109 mmol/L (ref 98–111)
Creatinine, Ser: 0.5 mg/dL (ref 0.44–1.00)
GFR calc Af Amer: >60 mL/min (ref >60)
GFR calc non Af Amer: >60 mL/min (ref >60)
Glucose, Bld: 231 mg/dL — ABNORMAL HIGH (ref 70–99)
Potassium: 3.5 mmol/L (ref 3.5–5.1)
Sodium: 141 mmol/L (ref 135–145)

## 2019-09-07 LAB — CORTISOL: Cortisol, Plasma: 1.1 ug/dL

## 2019-09-07 LAB — GLUCOSE, CAPILLARY
Glucose-Capillary: 182 mg/dL — ABNORMAL HIGH (ref 70–99)
Glucose-Capillary: 201 mg/dL — ABNORMAL HIGH (ref 70–99)

## 2019-09-07 LAB — MAGNESIUM: Magnesium: 2 mg/dL (ref 1.7–2.4)

## 2019-09-07 MED ORDER — LANTUS SOLOSTAR 100 UNIT/ML ~~LOC~~ SOPN: 30.0000 [IU] | PEN_INJECTOR | Freq: Every day | SUBCUTANEOUS | 99 refills | Status: DC

## 2019-09-07 MED ORDER — FLUDROCORTISONE ACETATE 0.1 MG PO TABS
0.1000 mg | ORAL_TABLET | Freq: Every day | ORAL | Status: DC
  Administered 2019-09-07: 0.1 mg via ORAL
  Filled 2019-09-07: qty 1

## 2019-09-07 MED ORDER — PEN NEEDLES 31G X 8 MM MISC: 6 refills | Status: DC

## 2019-09-07 MED ORDER — INSULIN GLARGINE 100 UNIT/ML ~~LOC~~ SOLN: 30.0000 [IU] | Freq: Every day | SUBCUTANEOUS | Status: DC

## 2019-09-07 MED ORDER — POTASSIUM CHLORIDE CRYS ER 20 MEQ PO TBCR
40.0000 meq | EXTENDED_RELEASE_TABLET | Freq: Once | ORAL | Status: AC
  Administered 2019-09-07: 40 meq via ORAL
  Filled 2019-09-07: qty 2

## 2019-09-07 MED ORDER — MIDODRINE HCL 10 MG PO TABS: 10.0000 mg | ORAL_TABLET | Freq: Two times a day (BID) | ORAL | 0 refills | Status: DC

## 2019-09-07 MED ORDER — TRAZODONE HCL 50 MG PO TABS
25.0000 mg | ORAL_TABLET | Freq: Once | ORAL | Status: AC
  Administered 2019-09-07: 25 mg via ORAL
  Filled 2019-09-07: qty 1

## 2019-09-07 MED FILL — MIDODRINE HCL 10 MG TABLET: 10 | 30 days supply | Qty: 60 | Fill #0

## 2019-09-07 MED FILL — PENTIPS 31G X 8 MM MISC: 31G X 8 MM | 30 days supply | Qty: 100 | Fill #0

## 2019-09-07 MED FILL — LANTUS SOLOSTAR 100 UNITS/M: 100 | 30 days supply | Qty: 9 | Fill #0

## 2019-09-07 NOTE — Discharge Summary (Signed)
Physician Discharge Summary  [name] [name] [id number] DOB: 25-Jul-1960 DOA: 09/05/2019  PCP: Antony Blackbird, MD  Admit date: 09/05/2019 Discharge date: 09/07/2019  Admitted From: home Discharge disposition: home   Recommendations for Outpatient Follow-Up:   1. Take medications as prescribed 2. Follow-up with cardiology as scheduled for evaluation of blood pressure and heart rate follow-up with primary care as scheduled for evaluation of diabetes control and possible adrenal insufficiency 3. Avoid hypoglycemia   Discharge Diagnosis:   Principal Problem:   Recurrent syncope Active Problems:   Diabetes type 2, uncontrolled (HCC)   Hypotension   Sinus tachycardia   Hypokalemia   Pulmonary nodules   Orthostasis   Adrenal insufficiency ([place])    Discharge Condition: Improved.  Diet recommendation: Low sodium, heart healthy.  Carbohydrate-modified.    Wound care: None.  Code status: Full.   History of Present Illness:   [name] [name] is a 59 y.o. female with medical history significant for recurrent syncopal episode suspected secondary to autonomic dysfunction, uncontrolled insulin-dependent diabetes mellitus, depression, and chronic pain, presented 2/17 to the emergency department after a syncopal episode at home.  Patient reported that she had not been eating or drinking much for the previous couple days, but continued to feel well until noting some increased fatigue and low blood pressures..  She took her usual medications that include midodrine and Florinef, reported continued hypotension despite this, took a second dose of her medications, and then reported that she woke up in her bed but unsure of how she got there.  She stated that her son had witnessed a syncopal episode and carried her to her bed.  She was not experiencing any chest pain, palpitations, shortness of breath, or headache.  She denied any recent fevers, chills, cough, shortness of breath, or sore throat.   She denies any illicit drug use.  Hospital Course by Problem:   #1.  Recurrent syncope.  Likely multifactorial secondary to neurogenic orthostatic hypotension in the setting of hypoglycemia and significantly decreased oral intake and dehydration.  Patient appeared clinically dry.  Patient was provided with 2 L of normal saline and her blood pressure improved but remained orthostatic.  Home medications of midodrine and Florinef resumed.  Of note patient had a stim test on June 2020 which was not consistent with adrenal insufficiency.  Even so she was provided with more IV fluids Home medications TED hose.  At discharge blood pressure improved.  Still drops with position change but she is asymptomatic.  Recommend close follow-up with PCP.  #2.  Sinus tachycardia.  Chest pain.  TSH within the limits of normal.  She had a CT angiogram of the chest no acute pulmonary embolism, no acute cardiopulmonary process, she is not hypoxic.  Chart review indicates her last hospitalization she had the same.   Appears to be chronic.  Evaluated by cardiology who opine baseline rate 80s to low 100s and no obvious cause noted in work-up.  Recommend simulation of current meds staying well-hydrated and avoiding low blood sugars.  At discharge patient's heart rate 82.  She has follow-up appointment with cardiology in March.  #3.  Hypokalemia. repleted and resolved  #4.  Diabetes type 2.  Uncontrolled.  Hemoglobin A1c 13.1 in December.  Home regimen includes Lantus 50 units as well as sliding scale at mealtime.  She reports she takes her insulin no matter what her p.o. intake has been.  EMS reported a CBG in the fifties during incident.  This was treated  and on admission serum glucose 197.  This morning serum glucose 243.  Will be discharged on Lantus dose of 30 with close outpatient follow-up.  I suspect she will need to be titrated back up to 50 units as I believe she will go home and eat whatever she wants.  #5.   Pulmonary nodules.  Incidental finding on CT.  Scattered bilateral pulmonary nodules all measuring less than approximately 5 mm favored to represent an infectious or inflammatory process.  No follow-up needed if patient low risk noncontrast chest CT consider 12 months if high risk.   Medical Consultants:  [facility] Lack cards    Discharge Exam:   Vitals:   09/07/19 0823 09/07/19 1149  BP:  (!) 146/89  Pulse:  82  Resp:  15  Temp:  97.7 F (36.5 C)  SpO2: 93% 98%   Vitals:   09/07/19 0500 09/07/19 0700 09/07/19 0823 09/07/19 1149  BP:    (!) 146/89  Pulse: (!) 104 (!) 106  82  Resp: (!) 9 17  15   Temp:    97.7 F (36.5 C)  TempSrc:    Oral  SpO2: 95% 97% 93% 98%  Weight:      Height:        General exam: Appears calm and comfortable. Poor dentintion Respiratory system: Clear to auscultation. Respiratory effort normal. Cardiovascular system: S1 & S2 heard, RRR. No JVD,  rubs, gallops or clicks. No murmurs. Gastrointestinal system: Abdomen is nondistended, soft and nontender. No organomegaly or masses felt. Normal bowel sounds heard. Central nervous system: Alert and oriented. No focal neurological deficits. Extremities: No clubbing,  or cyanosis. No edema. Skin: No rashes, lesions or ulcers. Psychiatry: Judgement and insight appear normal. Mood & affect appropriate.    The results of significant diagnostics from this hospitalization (including imaging, microbiology, ancillary and laboratory) are listed below for reference.     Procedures and Diagnostic Studies:   CT Angio Chest PE W and/or Wo Contrast  Result Date: 09/05/2019 CLINICAL DATA:  Syncope. EXAM: CT ANGIOGRAPHY CHEST WITH CONTRAST TECHNIQUE: Multidetector CT imaging of the chest was performed using the standard protocol during bolus administration of intravenous contrast. Multiplanar CT image reconstructions and MIPs were obtained to evaluate the vascular anatomy. CONTRAST:  80 mL of Omnipaque 350. COMPARISON:   None. FINDINGS: Cardiovascular: Contrast injection is sufficient to demonstrate satisfactory opacification of the pulmonary arteries to the segmental level. There is no pulmonary embolus. The main pulmonary artery is within normal limits for size. There is no CT evidence of acute right heart strain. The visualized aorta is normal. Heart size is normal, without pericardial effusion. Mediastinum/Nodes: --No mediastinal or hilar lymphadenopathy. --No axillary lymphadenopathy. --No supraclavicular lymphadenopathy. --Normal thyroid gland. --The esophagus is unremarkable Lungs/Pleura: There are few scattered 3-4 mm pulmonary nodules in the right upper lobe (axial series 6, image 15 and 26). There is a cluster of micro nodules in the right upper lobe (axial series 6, image 47). There is a 3-4 mm pulmonary nodule in the anterior left upper lobe (axial series 6, image 36). Upper Abdomen: No acute abnormality. Musculoskeletal: No chest wall abnormality. No acute or significant osseous findings. Review of the MIP images confirms the above findings. IMPRESSION: 1. No acute pulmonary embolism. 2. No acute cardiopulmonary process. 3. Scattered bilateral pulmonary nodules all measuring less than approximately 5 mm. These are favored to represent an infectious or inflammatory process. No follow-up needed if patient is low-risk (and has no known or suspected primary neoplasm). Non-contrast chest  CT can be considered in 12 months if patient is high-risk. This recommendation follows the consensus statement: Guidelines for Management of Incidental Pulmonary Nodules Detected on CT Images: From the Fleischner Society 2017; Radiology 2017; 284:228-243. Electronically Signed   By: Constance Holster M.D.   On: 09/05/2019 23:50     Labs:   Basic Metabolic Panel: Recent Labs  Lab 09/05/19 2045 09/05/19 2045 09/06/19 0400 09/06/19 0947 09/07/19 0410  NA 141  --   --  139 141  K 3.2*   < >  --  3.8 3.5  CL 103  --   --  108 109   CO2 25  --   --  20* 23  GLUCOSE 197*  --   --  243* 231*  BUN 18  --   --  7 8  CREATININE 0.66  --   --  0.55 0.50  CALCIUM 9.5  --   --  8.2* 8.3*  MG  --   --  1.6*  --  2.0   < > = values in this interval not displayed.   GFR Estimated Creatinine Clearance: 70.9 mL/min (by C-G formula based on SCr of 0.5 mg/dL). Liver Function Tests: No results for input(s): AST, ALT, ALKPHOS, BILITOT, PROT, ALBUMIN in the last 168 hours. No results for input(s): LIPASE, AMYLASE in the last 168 hours. No results for input(s): AMMONIA in the last 168 hours. Coagulation profile No results for input(s): INR, PROTIME in the last 168 hours.  CBC: Recent Labs  Lab 09/05/19 2045 09/06/19 0400 09/07/19 0410  WBC 10.8* 7.7 4.6  NEUTROABS  --  5.2  --   HGB 13.8 11.7* 11.9*  HCT 40.0 35.3* 35.4*  MCV 91.3 93.4 92.9  PLT 245 199 185   Cardiac Enzymes: No results for input(s): CKTOTAL, CKMB, CKMBINDEX, TROPONINI in the last 168 hours. BNP: Invalid input(s): POCBNP CBG: Recent Labs  Lab 09/06/19 1238 09/06/19 1644 09/06/19 2231 09/07/19 0650 09/07/19 1106  GLUCAP 151* 138* 249* 182* 201*   D-Dimer No results for input(s): DDIMER in the last 72 hours. Hgb A1c Recent Labs    09/06/19 1500  HGBA1C 11.5*   Lipid Profile No results for input(s): CHOL, HDL, LDLCALC, TRIG, CHOLHDL, LDLDIRECT in the last 72 hours. Thyroid function studies Recent Labs    09/05/19 2205  TSH 0.794   Anemia work up No results for input(s): VITAMINB12, FOLATE, FERRITIN, TIBC, IRON, RETICCTPCT in the last 72 hours. Microbiology Recent Results (from the past 240 hour(s))  SARS CORONAVIRUS 2 (TAT 6-24 HRS) Nasopharyngeal Nasopharyngeal Swab     Status: None   Collection Time: 09/06/19 12:36 AM   Specimen: Nasopharyngeal Swab  Result Value Ref Range Status   SARS Coronavirus 2 NEGATIVE NEGATIVE Final    Comment: (NOTE) SARS-CoV-2 target nucleic acids are NOT DETECTED. The SARS-CoV-2 RNA is generally  detectable in upper and lower respiratory specimens during the acute phase of infection. Negative results do not preclude SARS-CoV-2 infection, do not rule out co-infections with other pathogens, and should not be used as the sole basis for treatment or other patient management decisions. Negative results must be combined with clinical observations, patient history, and epidemiological information. The expected result is Negative. Fact Sheet for Patients: SugarRoll.be Fact Sheet for Healthcare Providers: https://www.woods-mathews.com/ This test is not yet approved or cleared by the [place] FDA and  has been authorized for detection and/or diagnosis of SARS-CoV-2 by FDA under an Emergency Use Authorization (EUA). This EUA will remain  in effect (meaning this test can be used) for the duration of the COVID-19 declaration under Section 56 4(b)(1) of the Act, 21 U.S.C. section 360bbb-3(b)(1), unless the authorization is terminated or revoked sooner. Performed at [facility], [place] [address], [place], [place] [address]      Discharge Instructions:   Discharge Instructions    Call MD for:  difficulty breathing, headache or visual disturbances   Complete by: As directed    Call MD for:  persistant dizziness or light-headedness   Complete by: As directed    Call MD for:  temperature >100.4   Complete by: As directed    Diet - low sodium heart healthy   Complete by: As directed    Discharge instructions   Complete by: As directed    Take medications as prescribed Follow up with PCP 1-2 weeks for evaluation of diabetes control, BP control and adrenal insufficiency Carb modified diet   Increase activity slowly   Complete by: As directed      Allergies as of 09/07/2019      Reactions   Tramadol Nausea And Vomiting   REACTION: Projectile vomiting      Medication List    TAKE these medications   acetaminophen 500 MG  tablet Commonly known as: TYLENOL Take 2 tablets (1,000 mg total) by mouth every 6 (six) hours as needed for mild pain or headache (pain).   DULoxetine 60 MG capsule Commonly known as: CYMBALTA Take 1 capsule (60 mg total) by mouth 2 (two) times daily.   fludrocortisone 0.1 MG tablet Commonly known as: FLORINEF Take 1 tablet (0.1 mg total) by mouth daily.   gabapentin 600 MG tablet Commonly known as: NEURONTIN Take 1-2 tablets (600-1,200 mg total) by mouth See admin instructions. Take one tablet (600 mg) by mouth every morning and afternoon, take two tablets (1200 mg) at night   glucose blood test strip [employer] Testing Strips Use as instructed   HumaLOG KwikPen 100 UNIT/ML KwikPen Generic drug: insulin lispro Inject 0.1 mLs (10 Units total) into the skin 3 (three) times daily. What changed:   when to take this  additional instructions   Lantus SoloStar 100 UNIT/ML Solostar Pen Generic drug: Insulin Glargine Inject 30 Units into the skin daily. What changed: how much to take   midodrine 10 MG tablet Commonly known as: PROAMATINE Take 1 tablet (10 mg total) by mouth 2 (two) times daily with a meal.   Pen Needles 31G X 8 MM Misc 1 Units by Does not apply route daily.      Follow-up Information    Roby Lofts M., PA-C Follow up.   Specialty: Physician Assistant Why: [facility] location - September 18, 2019 at 2:15 PM (Arrive by 2:00 PM). Daleen Snook is one of the PAs that works with Dr. Gwenlyn Found and our cardiology team. Contact information: [address] [place] [address] [phone]            Time coordinating discharge: 40 minutes  Signed:  Radene Gunning NP  [facility] 09/07/2019, 12:11 PM

## 2019-09-07 NOTE — Progress Notes (Addendum)
Progress Note  Patient Name: [name] [name] Date of Encounter: 09/07/2019  Primary Cardiologist: Quay Burow, MD   Subjective   No acute overnight events. No recurrent syncopal episodes. She was able to ambulate the halls this morning without any problems. No lightheadedness/dizziness. No chest pain or shortness of breath.  Inpatient Medications    Scheduled Meds: . DULoxetine  60 mg Oral BID  . enoxaparin (LOVENOX) injection  40 mg Subcutaneous Daily  . fludrocortisone  0.1 mg Oral Daily  . gabapentin  600 mg Oral BID  . gabapentin  1,200 mg Oral QHS  . insulin aspart  0-15 Units Subcutaneous TID WC  . insulin aspart  0-5 Units Subcutaneous QHS  . insulin aspart  3 Units Subcutaneous TID WC  . insulin glargine  20 Units Subcutaneous Daily  . midodrine  10 mg Oral BID WC  . sodium chloride flush  3 mL Intravenous Q12H   Continuous Infusions: . sodium chloride 100 mL/hr at 09/07/19 0019   PRN Meds: acetaminophen **OR** acetaminophen, ondansetron **OR** ondansetron (ZOFRAN) IV   Vital Signs    Vitals:   09/07/19 0300 09/07/19 0500 09/07/19 0700 09/07/19 0823  BP: (!) 145/89     Pulse: 100 (!) 104 (!) 106   Resp: 13 (!) 9 17   Temp: 97.6 F (36.4 C)     TempSrc: Oral     SpO2: 96% 95% 97% 93%  Weight: 63.3 kg     Height:        Intake/Output Summary (Last 24 hours) at 09/07/2019 0931 Last data filed at 09/07/2019 0828 Gross per 24 hour  Intake 1484.32 ml  Output 2400 ml  Net -915.68 ml   Last 3 Weights 09/07/2019 09/06/2019 09/05/2019  Weight (lbs) 139 lb 8 oz 141 lb 6.4 oz 145 lb  Weight (kg) 63.277 kg 64.139 kg 65.772 kg      Telemetry    Normal sinus rhythm - Personally Reviewed  ECG    No new ECG tracing. - Personally Reviewed  Physical Exam   GEN: No acute distress.   Neck: Supple. Cardiac: RRR. No murmurs, rubs, or gallops.  Respiratory: Clear to auscultation bilaterally. No wheezes, rhonchi, or rales. GI: Soft, non-distended, and non-tender.    MS: Wearing bilateral compression stockings. No lower extremity edema. No deformity. Skin: Warm and dry. Neuro:  No focal deficits. Psych: Normal affect. Responds appropriately.  Labs    High Sensitivity Troponin:   Recent Labs  Lab 09/06/19 0011  TROPONINIHS 8      Chemistry Recent Labs  Lab 09/05/19 2045 09/06/19 0947 09/07/19 0410  NA 141 139 141  K 3.2* 3.8 3.5  CL 103 108 109  CO2 25 20* 23  GLUCOSE 197* 243* 231*  BUN 18 7 8   CREATININE 0.66 0.55 0.50  CALCIUM 9.5 8.2* 8.3*  GFRNONAA >60 >60 >60  GFRAA >60 >60 >60  ANIONGAP 13 11 9      Hematology Recent Labs  Lab 09/05/19 2045 09/06/19 0400 09/07/19 0410  WBC 10.8* 7.7 4.6  RBC 4.38 3.78* 3.81*  HGB 13.8 11.7* 11.9*  HCT 40.0 35.3* 35.4*  MCV 91.3 93.4 92.9  MCH 31.5 31.0 31.2  MCHC 34.5 33.1 33.6  RDW 12.1 12.1 12.4  PLT 245 199 185    BNPNo results for input(s): BNP, PROBNP in the last 168 hours.   DDimer No results for input(s): DDIMER in the last 168 hours.   Radiology    CT Angio Chest PE W and/or Wo  Contrast  Result Date: 09/05/2019 CLINICAL DATA:  Syncope. EXAM: CT ANGIOGRAPHY CHEST WITH CONTRAST TECHNIQUE: Multidetector CT imaging of the chest was performed using the standard protocol during bolus administration of intravenous contrast. Multiplanar CT image reconstructions and MIPs were obtained to evaluate the vascular anatomy. CONTRAST:  80 mL of Omnipaque 350. COMPARISON:  None. FINDINGS: Cardiovascular: Contrast injection is sufficient to demonstrate satisfactory opacification of the pulmonary arteries to the segmental level. There is no pulmonary embolus. The main pulmonary artery is within normal limits for size. There is no CT evidence of acute right heart strain. The visualized aorta is normal. Heart size is normal, without pericardial effusion. Mediastinum/Nodes: --No mediastinal or hilar lymphadenopathy. --No axillary lymphadenopathy. --No supraclavicular lymphadenopathy. --Normal  thyroid gland. --The esophagus is unremarkable Lungs/Pleura: There are few scattered 3-4 mm pulmonary nodules in the right upper lobe (axial series 6, image 15 and 26). There is a cluster of micro nodules in the right upper lobe (axial series 6, image 47). There is a 3-4 mm pulmonary nodule in the anterior left upper lobe (axial series 6, image 36). Upper Abdomen: No acute abnormality. Musculoskeletal: No chest wall abnormality. No acute or significant osseous findings. Review of the MIP images confirms the above findings. IMPRESSION: 1. No acute pulmonary embolism. 2. No acute cardiopulmonary process. 3. Scattered bilateral pulmonary nodules all measuring less than approximately 5 mm. These are favored to represent an infectious or inflammatory process. No follow-up needed if patient is low-risk (and has no known or suspected primary neoplasm). Non-contrast chest CT can be considered in 12 months if patient is high-risk. This recommendation follows the consensus statement: Guidelines for Management of Incidental Pulmonary Nodules Detected on CT Images: From the Fleischner Society 2017; Radiology 2017; 284:228-243. Electronically Signed   By: Constance Holster M.D.   On: 09/05/2019 23:50    Cardiac Studies   Echocardiogram 07/01/2019: Impressions: 1. Left ventricular ejection fraction, by visual estimation, is 60 to  65%. The left ventricle has normal function. There is no left ventricular  hypertrophy.  2. Indeterminate diastolic filling due to E-A fusion.  3. The left ventricle has no regional wall motion abnormalities.  4. Global right ventricle has normal systolic function.The right  ventricular size is normal. No increase in right ventricular wall  thickness.  5. Left atrial size was normal.  6. Right atrial size was normal.  7. The pericardial effusion is circumferential.  8. Trivial pericardial effusion is present.  9. The mitral valve is normal in structure. No evidence of mitral  valve  regurgitation. No evidence of mitral stenosis.  10. The tricuspid valve is normal in structure. Tricuspid valve  regurgitation is not demonstrated.  11. The aortic valve is tricuspid. Aortic valve regurgitation is not  visualized. No evidence of aortic valve sclerosis or stenosis.  12. The pulmonic valve was not well visualized. Pulmonic valve  regurgitation is not visualized.   Patient Profile   Ms. Henton is a 59 y.o. 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 Myna Hidalgo.  Assessment & Plan    Syncope with Documented Orthostasis After Admission - Felt to be related to autonomic neuropathy in the setting of uncontrolled diabetes.  - Echo shows LVEF of 60-65% with normal wall motion and no significant valvular disease.  - No arrhythmias noted on telemetry. -  Continue Florinef 0.1mg  daily. - Midodrine 10mg  twice daily started yesterday.  - Patient feeling better today. Able to ambulate in the falls this morning with no symptoms.  - Will need to monitor for supine hypertension. Consider rechecking orthostatic vital signs today. - Patient has follow-up arranged in our office for 09/18/2019. Patient knows that she should not drive until seen/cleared at follow-up.   Sinus Tachycardia - Appears to be a chronic problem for patient. 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 - Telemetry reviewed and shows sinus rhythm with baseline rates in the 80's to low 100's with brief spikes in the 110's to 120's.  - No obvious causes noted in work up so far such as anemia, hyperthyroidism, pericardial effusion, UDS, etc.  Uncontrolled Diabetes Mellitus - Management per primary team.  Hypokalemic/Hypomagnesemia - Potassium 3.2 on 09/05/2019. Repleted. Improved to 3.5 today. - Magnesium 1.6  yesterday. Repleted. Improved to 2.0 today. - Will defer any additional repletion to primary team.  For questions or updates, please contact [place] Please consult [email or web address] for contact info under        Signed, Darreld Mclean, PA-C  09/07/2019, 9:31 AM    I have examined the patient and reviewed assessment and plan and discussed with patient.  Agree with above as stated.  BP 140/83 while lying down currently.  Less orthostatic symptoms at this time. COntinue current meds.  Stay well hydrated and avoid low blood sugars as well.   Larae Grooms

## 2019-09-07 NOTE — TOC Progression Note (Addendum)
Transition of Care [facility]) - Progression Note    Patient Details  Name: [name] [name] MRN: [id number] Date of Birth: 07-Oct-1960  [facility] CM/SW Contact  Zenon Mayo, RN Phone Number: 09/07/2019, 12:34 PM  Clinical Narrative:    Patient is for dc today, she has follow up at [facility] clinic, TOC is filling her meds for her. NCM assisted with [employer] . Patient states her son has her debit card and the [facility] pharmacy can call him so he can pay it over the phone.  NCM spoke with Konrad Dolores , patient's son, he has paid for her meds.  He states he is working on  making a couple of phone calls to get her transportation home.  NCM received call from son, stating unable to find transportation.  NCM received approval from supervisor Towanda Octave for [facility] voucher.        Expected Discharge Plan and Services           Expected Discharge Date: 09/07/19                                     Social Determinants of Health (SDOH) Interventions    Readmission Risk Interventions No flowsheet data found.

## 2019-09-08 ENCOUNTER — Telehealth: Payer: Self-pay

## 2019-09-08 NOTE — Telephone Encounter (Signed)
[facility]  Follow-up Telephone Call  Date of discharge and from where: 09/07/2019 from [facility]  How have you been since you were released from the hospital? Pt stated feeling much better  Any questions or concerns?  None verbalized  Items Reviewed:  Did the pt receive and understand the discharge instructions provided?  Yes  Medications obtained and verified?  Yes /verified with the patient    Any new allergies since your discharge? None stated  Dietary orders reviewed? Reviewed with patient   Do you have support at home?  Yes, son and family  Functional Questionnaire: (I = Independent and D = Dependent) ADLs: I  Bathing/Dressing- I  Meal Prep- I  Eating- I  Maintaining continence- I  Transferring/Ambulation- I  Managing Meds- I  Follow up appointments reviewed:   PCP Hospital f/u appt confirmed? Scheduled to see Dr Chapman Fitch on 09/20/2019 @ 10:50am  [facility] f/u appt confirmed?  Stated have cardiology apt scheduled    Are transportation arrangements needed? No / son will drive if needed  If their condition worsens, is the pt aware to call PCP or go to the Emergency Dept.? Yes/made pt aware     Was the patient provided with contact information for the PCP's office or ED? YES  Was to pt encouraged to call back with questions or concerns?  PT/ WAS ENCOURAGED TO CALL OFFICE WITH ANY QUESTIONS OR CONCERNS/ VERBALIZED UNDERSTANDING   OTHER    Pt stated last DM check and BP were done yesterday at the Hospital. Asked pt if possible to have it done now/ stated she just woke up and prefers to have it done a little later. Stressed the importance to have regular BP and DM reading throughout the day and to keep a record and bring it to PCP appt. Stressed the importance to take meds as instructed and follow a modified carb diet. Verbalized understanding

## 2019-09-17 NOTE — Progress Notes (Signed)
Cardiology Office Note   Date:  09/18/2019   ID:  [name] [name], DOB 06/22/1961, MRN [id number]  PCP:  Antony Blackbird, MD  Cardiologist:  Quay Burow, MD EP: None  Chief Complaint  Patient presents with  . Hospitalization Follow-up      History of Present Illness: [name] [name] is a 59 y.o. female with PMH of orthostatic hypotension, recurrent syncope, poorly controlled DM type 2, GERD, and depression, who presents for post-hospital follow-up.  She was last evaluated by cardiology during a recent admission to [facility] from 09/06/19-09/07/19 after presenting with syncope. She was hypoglycemic and had markedly positive orthostatic vitals which were felt to be the culprit of her syncope. She was started on midodrine, in addition to home florinef for management of her orthostatic hypotension. Echo 06/2019 showed EF 60-65%, indeterminate LV diastolic function, no RWMA, and no significant valvular abnormalities.   She has done well since discharge from the hospital. No complaints of recurrent syncope. She has occasional lightheadedness, though less than before her admission. She reports BP has been in the 130s/70s at home. Blood sugars have also been more stable since discharge. She has a visit with her PCP 3/3 for further management. She denies chest pain, SOB, palpitations, LE edema, orthopnea, or PND.    Past Medical History:  Diagnosis Date  . Cervical disc disorder with radiculopathy of cervical region   . Chronic low back pain    HNP  . Chronic pain syndrome   . Degenerative joint disease   . Depression dx 1997  . Diabetes mellitus, type 2 ([place]) 2011   No insulin  . Diabetic polyneuropathy ([place])   . GERD (gastroesophageal reflux disease)   . Glaucoma   . Headache(784.0)   . Herniated disc   . Hypertension    Lab 11/2011:  CXR, EKG, CBC, TSH, BMet, troponin-normal; lipid profile: 188, 131, 36, 126   . Lumbar herniated disc   . Non-compliance   . Pancreatic cyst    Endoscopic  aspiration in 09/2009  . Pneumonia 08/02/2012  . Shingles   . 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 Torn meniscus   . Vertigo     Past Surgical History:  Procedure Laterality Date  . ABDOMINAL HYSTERECTOMY    . CESAREAN SECTION       Current Outpatient Medications  Medication Sig Dispense Refill  . acetaminophen (TYLENOL) 500 MG tablet Take 2 tablets (1,000 mg total) by mouth every 6 (six) hours as needed for mild pain or headache (pain).  0  . DULoxetine (CYMBALTA) 60 MG capsule Take 1 capsule (60 mg total) by mouth 2 (two) times daily. 60 capsule 4  . fludrocortisone (FLORINEF) 0.1 MG tablet Take 1 tablet (0.1 mg total) by mouth daily. 30 tablet 2  . gabapentin (NEURONTIN) 600 MG tablet Take 1-2 tablets (600-1,200 mg total) by mouth See admin instructions. Take one tablet (600 mg) by mouth every morning and afternoon, take two tablets (1200 mg) at night 120 tablet 1  . glucose blood test strip Relion Testing Strips Use as instructed 100 each 12  . HUMALOG KWIKPEN 100 UNIT/ML KwikPen Inject 0.1 mLs (10 Units total) into the skin 3 (three) times daily. (Patient taking differently: Inject 10 Units into the skin daily at 12 noon. Per sliding scale) 15 mL prn  . Insulin Glargine (LANTUS SOLOSTAR) 100 UNIT/ML Solostar Pen Inject 30 Units into the skin daily. 15 mL prn  . Insulin Pen Needle (  PEN NEEDLES) 31G X 8 MM MISC 1 Units by Does not apply route daily. 100 each 2  . Insulin Pen Needle (PEN NEEDLES) 31G X 8 MM MISC Use as directed 100 each 6  . midodrine (PROAMATINE) 10 MG tablet Take 1 tablet (10 mg total) by mouth 2 (two) times daily with a meal. 60 tablet 0   No current facility-administered medications for this visit.    Allergies:   Tramadol    Social History:  The patient  reports that she quit smoking about 41 years ago. She has never used smokeless tobacco. She reports that she does not drink alcohol or use drugs.   Family History:  The patient's family history  includes Depression in her mother; Diabetes in her mother; Heart attack in her brother; Heart failure in her maternal grandmother; Lung cancer in her father; Renal Disease in her mother.    ROS:  Please see the history of present illness.   Otherwise, review of systems are positive for none.   All other systems are reviewed and negative.    PHYSICAL EXAM: VS:  BP 132/76   Pulse (!) 109   Temp (!) 97 F (36.1 C)   Ht 5\' 6"  (1.676 m)   Wt 138 lb 6.4 oz (62.8 kg)   SpO2 96%   BMI 22.34 kg/m  , BMI Body mass index is 22.34 kg/m. GEN: Well nourished, well developed, in no acute distress HEENT: sclera anicteric Neck: no JVD, carotid bruits, or masses Cardiac: RRR; no murmurs, rubs, or gallops, no edema  Respiratory:  clear to auscultation bilaterally, normal work of breathing GI: soft, nontender, nondistended, + BS MS: no deformity or atrophy Skin: warm and dry, no rash Neuro:  Strength and sensation are intact Psych: euthymic mood, full affect   EKG:  EKG is not ordered today.   Recent Labs: 07/01/2019: ALT 26 09/05/2019: TSH 0.794 09/07/2019: BUN 8; Creatinine, Ser 0.50; Hemoglobin 11.9; Magnesium 2.0; Platelets 185; Potassium 3.5; Sodium 141    Lipid Panel    Component Value Date/Time   CHOL 197 03/15/2019 1056   TRIG 260 (H) 03/15/2019 1056   HDL 36 (L) 03/15/2019 1056   CHOLHDL 5.5 (H) 03/15/2019 1056   CHOLHDL 7.6 04/10/2016 0714   VLDL 65 (H) 04/10/2016 0714   LDLCALC 109 (H) 03/15/2019 1056      Wt Readings from Last 3 Encounters:  09/18/19 138 lb 6.4 oz (62.8 kg)  09/07/19 139 lb 8 oz (63.3 kg)  08/09/19 138 lb 6.4 oz (62.8 kg)      Other studies Reviewed: Additional studies/ records that were reviewed today include:   Echocardiogram 06/2019: 1. Left ventricular ejection fraction, by visual estimation, is 60 to  65%. The left ventricle has normal function. There is no left ventricular  hypertrophy.  2. Indeterminate diastolic filling due to E-A  fusion.  3. The left ventricle has no regional wall motion abnormalities.  4. Global right ventricle has normal systolic function.The right  ventricular size is normal. No increase in right ventricular wall  thickness.  5. Left atrial size was normal.  6. Right atrial size was normal.  7. The pericardial effusion is circumferential.  8. Trivial pericardial effusion is present.  9. The mitral valve is normal in structure. No evidence of mitral valve  regurgitation. No evidence of mitral stenosis.  10. The tricuspid valve is normal in structure. Tricuspid valve  regurgitation is not demonstrated.  11. The aortic valve is tricuspid. Aortic valve regurgitation  is not  visualized. No evidence of aortic valve sclerosis or stenosis.  12. The pulmonic valve was not well visualized. Pulmonic valve  regurgitation is not visualized.     ASSESSMENT AND PLAN:  1. Orthostatic hypotension: Still with occasional lightheadedness, though this is significantly better with addition of midodrine. She denies any hypotensive episodes since discharge. - Continue florinef and midodrine  2. Recurrent syncope: no syncopal episodes since discharge - Continue florinef and midodrine  3. DM type 2: A1C 11.5 09/06/19 - Continue insulin per PCP   Current medicines are reviewed at length with the patient today.  The patient does not have concerns regarding medicines.  The following changes have been made:  no change  Labs/ tests ordered today include:  No orders of the defined types were placed in this encounter.    Disposition:   FU with Dr. Gwenlyn Found in 6 months  Signed, Abigail Butts, PA-C  09/18/2019 3:12 PM

## 2019-09-18 ENCOUNTER — Other Ambulatory Visit: Payer: Self-pay

## 2019-09-18 ENCOUNTER — Encounter: Payer: Self-pay | Admitting: Medical

## 2019-09-18 ENCOUNTER — Ambulatory Visit (INDEPENDENT_AMBULATORY_CARE_PROVIDER_SITE_OTHER): Payer: Self-pay | Admitting: Medical

## 2019-09-18 VITALS — BP 132/76 | HR 109 | Temp 97.0°F | Ht 66.0 in | Wt 138.4 lb

## 2019-09-18 DIAGNOSIS — E1142 Type 2 diabetes mellitus with diabetic polyneuropathy: Secondary | ICD-10-CM

## 2019-09-18 DIAGNOSIS — G903 Multi-system degeneration of the autonomic nervous system: Secondary | ICD-10-CM

## 2019-09-18 DIAGNOSIS — R55 Syncope and collapse: Secondary | ICD-10-CM

## 2019-09-18 NOTE — Patient Instructions (Signed)

## 2019-09-20 ENCOUNTER — Other Ambulatory Visit: Payer: Self-pay

## 2019-09-20 ENCOUNTER — Ambulatory Visit: Payer: Self-pay | Attending: Family Medicine | Admitting: Family Medicine

## 2019-09-20 ENCOUNTER — Encounter: Payer: Self-pay | Admitting: Family Medicine

## 2019-09-20 DIAGNOSIS — Z91199 Patient's other noncompliance with medication regimen for other reason: Secondary | ICD-10-CM

## 2019-09-20 DIAGNOSIS — E1165 Type 2 diabetes mellitus with hyperglycemia: Secondary | ICD-10-CM

## 2019-09-20 DIAGNOSIS — Z9111 Patient's noncompliance with dietary regimen: Secondary | ICD-10-CM

## 2019-09-20 DIAGNOSIS — Z91148 Patient's other noncompliance with medication regimen for other reason: Secondary | ICD-10-CM

## 2019-09-20 DIAGNOSIS — I951 Orthostatic hypotension: Secondary | ICD-10-CM

## 2019-09-20 DIAGNOSIS — Z9114 Patient's other noncompliance with medication regimen: Secondary | ICD-10-CM

## 2019-09-20 DIAGNOSIS — M25511 Pain in right shoulder: Secondary | ICD-10-CM

## 2019-09-20 DIAGNOSIS — E876 Hypokalemia: Secondary | ICD-10-CM

## 2019-09-20 DIAGNOSIS — Z09 Encounter for follow-up examination after completed treatment for conditions other than malignant neoplasm: Secondary | ICD-10-CM

## 2019-09-20 DIAGNOSIS — R55 Syncope and collapse: Secondary | ICD-10-CM

## 2019-09-20 DIAGNOSIS — R918 Other nonspecific abnormal finding of lung field: Secondary | ICD-10-CM

## 2019-09-20 DIAGNOSIS — Z91119 Patient's noncompliance with dietary regimen due to unspecified reason: Secondary | ICD-10-CM

## 2019-09-20 DIAGNOSIS — K0889 Other specified disorders of teeth and supporting structures: Secondary | ICD-10-CM

## 2019-09-20 DIAGNOSIS — R42 Dizziness and giddiness: Secondary | ICD-10-CM

## 2019-09-20 NOTE — Progress Notes (Signed)
Virtual Visit via Telephone Note  I connected with [name] [name] on 09/20/19 at 10:50 AM EST by telephone and verified that I am speaking with the correct person using two identifiers.   I discussed the limitations, risks, security and privacy concerns of performing an evaluation and management service by telephone and the availability of in person appointments. I also discussed with the patient that there may be a patient responsible charge related to this service. The patient expressed understanding and agreed to proceed.  Patient Location: Home Provider Location: [facility] Others participating in call: none   History of Present Illness:         59 year old female with history of poorly controlled type 2 diabetes with diabetic neuropathy and issues with recurrent orthostatic hypotension who is status post recent hospitalization after witnessed syncopal event at her home.  She was hospitalized at [facility] from 09/05/2019 through 09/07/2019 due to recurrent syncope.  Patient reports no injuries with the syncopal event.  She states that she was told by her son that she fell backwards slightly and slid down a dresser coming to rest in a sitting position on the floor.  Patient states that she recalls going towards the door that day but this is the last thing she recalls.  She did see cardiology yesterday and follow-up and they again discussed orthostatic hypotension.  She is taking the medication daily to help keep her blood pressure higher.  She continues to have issues with dizziness when she changes positions such as from sitting to standing and also she has been walking for a little bit she will also have onset of dizziness as well as abnormal sensation in which her legs feel numb as if they will give away.  She is using her compression hose which have helped with the sensation of dizziness when she goes from sitting to standing.           She reports that she has been compliant with her  diabetes medications.  Blood sugars currently are in the 140s to 150s however she did have 1 day when she checked her blood sugar and it was 200 but she realized that she had had a small Coke to drink and that she had also forgotten to take her insulin.  She denies any hypoglycemic episodes.            She would like to be referred to a dentist as she reports that she had a several bad teeth including one left upper tooth that is broken and to right upper teeth that have holes in them and are decayed and she has pain in the teeth which is increased with attempts to eat.  She also reports that about 2 months ago she had 2 teeth that simply fell out.              She has also had onset of pain in the right upper arm or shoulder area.  She does not recall any incident that caused the pain however she can no longer reach behind her back/scratch her back without increased pain and she is limited in how far she can reach behind her.  She can reach overhead with the right arm/shoulder however she will have some numbness and tingling in her hands when this occurs.  She also reports that at night she cannot sleep on her right shoulder and she actually has increased pain during the night in the right shoulder which awakens her from sleep.  Pain ranges from a 6 to a 10 on a 0-to-10 scale.   Past Medical History:  Diagnosis Date  . Cervical disc disorder with radiculopathy of cervical region   . Chronic low back pain    HNP  . Chronic pain syndrome   . Degenerative joint disease   . Depression dx 1997  . Diabetes mellitus, type 2 ([place]) 2011   No insulin  . Diabetic polyneuropathy ([place])   . GERD (gastroesophageal reflux disease)   . Glaucoma   . Headache(784.0)   . Herniated disc   . Hypertension    Lab 11/2011:  CXR, EKG, CBC, TSH, BMet, troponin-normal; lipid profile: 188, 131, 36, 126   . Lumbar herniated disc   . Non-compliance   . Pancreatic cyst    Endoscopic aspiration in 09/2009  . Pneumonia  08/02/2012  . Shingles   . 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 Torn meniscus   . Vertigo     Past Surgical History:  Procedure Laterality Date  . ABDOMINAL HYSTERECTOMY    . CESAREAN SECTION      Family History  Problem Relation Age of Onset  . Depression Mother        type 1  . Diabetes Mother   . Renal Disease Mother   . Lung cancer Father   . Heart attack Brother        Died age 67 - was told due to massive MI/heart "exploded"  . Heart failure Maternal Grandmother        Details not totally clear    Social History   Tobacco Use  . Smoking status: Former Smoker    Quit date: 08/03/1978    Years since quitting: 41.1  . Smokeless tobacco: Never Used  . Tobacco comment: Smoked age [age]  Substance Use Topics  . Alcohol use: No    Comment: Former  . Drug use: No     Allergies  Allergen Reactions  . Tramadol Nausea And Vomiting    REACTION: Projectile vomiting       Observations/Objective: No vital signs or physical exam conducted as visit was done via telephone  Assessment and Plan: 1. Hospital discharge follow-up; 2. Orthostatic hypotension; Recurrent Syncope; pulmonary nodules Patient's hospital discharge records reviewed and discussed with the patient at today's visit.  She has seen cardiology on 09/18/2019 in follow-up of her orthostatic hypotension, recurrent syncope and hospitalization.  She reports no further syncopal episodes since hospital discharge.  She continues to attempt to remain well-hydrated and to wear support hose and take midodrine as prescribed to help keep her blood pressure in the normal range.  She also had low potassium and low magnesium during her hospitalization and will schedule lab visit in the next 2 weeks to have these levels repeated.  She will need 34-month follow-up CT scan for pulmonary nodule seen during hospitalization imaging. - Magnesium; Future -BMP; Future  3. Uncontrolled type 2 diabetes mellitus with  hyperglycemia ([place]) She reports improved control of her blood sugars as she is now trying to make sure that she takes her medications as prescribed and on a daily basis.  She is to schedule lab visit in the next 2 weeks for BMP.  She is also having issues with tooth pain and poor dentition for which she will be referred to dentistry as her dental issues are likely adversely affected by her poorly controlled diabetes. - Basic Metabolic Panel; Future - Ambulatory referral to Dentistry  4. Dizziness; Hypokalemia; Hypomagnesemia She continues to have dizziness which is most likely related to her orthostatic hypotension/autonomic dysfunction.  She is encouraged to continue use of medication to help keep blood pressure elevated.  Patient's recent cardiology note was reviewed at today's visit and discussed with patient.  She will return for lab visit and follow-up of hypokalemia which may be contributing to dizziness as well as repeat magnesium level as both potassium and magnesium levels were decreased during hospitalization and patient was provided with replacement therapy. - Basic Metabolic Panel; Future  5. Noncompliance with diet and medication regimen She has a history of noncompliance with diet and medication but states that now she is trying to take her medications consistently to help avoid further complications.  7. Tooth pain Patient with history of poorly controlled diabetes and now having issues with poor dentition including losing teeth, a broken tooth and 2 teeth on the right upper side with cavities which are causing pain and limiting patient's ability to eat.  She will be referred to dentistry for further evaluation and treatment. - Ambulatory referral to Dentistry  8. Acute pain of right shoulder She reports recent onset of acute right shoulder pain without known injury.  She will be referred to orthopedics for further evaluation and treatment. - AMB referral to orthopedics   Follow  Up Instructions:Return for Chronic issues-keep follow-up appointment in April, sooner if needed.    I discussed the assessment and treatment plan with the patient. The patient was provided an opportunity to ask questions and all were answered. The patient agreed with the plan and demonstrated an understanding of the instructions.   The patient was advised to call back or seek an in-person evaluation if the symptoms worsen or if the condition fails to improve as anticipated.  I provided 14 minutes of non-face-to-face time during this encounter.  An additional 15 minutes was spent reviewing patient's chart/hospital records/labs and recent specialty follow-up with cardiology.  Additional time was spent on placing orders for labs, specialty follow-up and completion of visit note.   Antony Blackbird, MD

## 2019-10-03 ENCOUNTER — Other Ambulatory Visit: Payer: Self-pay | Admitting: Cardiovascular Disease

## 2019-10-03 ENCOUNTER — Other Ambulatory Visit: Payer: Self-pay | Admitting: Family Medicine

## 2019-10-03 DIAGNOSIS — E1142 Type 2 diabetes mellitus with diabetic polyneuropathy: Secondary | ICD-10-CM

## 2019-10-03 DIAGNOSIS — E1165 Type 2 diabetes mellitus with hyperglycemia: Secondary | ICD-10-CM

## 2019-10-03 MED FILL — DULoxetine HCL 60 MG CPEP: 60 | 30 days supply | Qty: 60 | Fill #3

## 2019-10-04 MED FILL — FLUDROCORTISONE 0.1 MG TAB: 0.1 | 30 days supply | Qty: 30 | Fill #0

## 2019-10-04 MED FILL — ?AMOXICILLIN 500 MG CAPS: 500 | 10 days supply | Qty: 30 | Fill #0

## 2019-10-05 MED FILL — GABAPENTIN 600 MG TABLET: 600 | 30 days supply | Qty: 120 | Fill #0

## 2019-10-26 ENCOUNTER — Ambulatory Visit: Payer: Self-pay | Attending: Family Medicine

## 2019-10-30 MED FILL — FLUDROCORTISONE 0.1 MG TAB: 0.1 | 30 days supply | Qty: 30 | Fill #1

## 2019-10-30 MED FILL — DULoxetine HCL 60 MG CPEP: 60 | 30 days supply | Qty: 60 | Fill #4

## 2019-11-01 ENCOUNTER — Ambulatory Visit: Payer: No Typology Code available for payment source | Admitting: Family Medicine

## 2019-11-01 MED FILL — GABAPENTIN 600 MG TABLET: 600 | 30 days supply | Qty: 120 | Fill #1

## 2019-11-03 NOTE — Progress Notes (Signed)
Patient did not show for appointment.   

## 2019-11-05 ENCOUNTER — Encounter: Payer: Self-pay | Admitting: Family Medicine

## 2019-11-06 ENCOUNTER — Ambulatory Visit (HOSPITAL_BASED_OUTPATIENT_CLINIC_OR_DEPARTMENT_OTHER): Payer: Self-pay | Admitting: Family

## 2019-11-10 ENCOUNTER — Ambulatory Visit (HOSPITAL_COMMUNITY)
Admission: EM | Admit: 2019-11-10 | Discharge: 2019-11-10 | Disposition: A | Discharge status: Home / Self Care | Payer: Self-pay | Attending: Urgent Care | Admitting: Urgent Care

## 2019-11-10 ENCOUNTER — Encounter (HOSPITAL_COMMUNITY): Payer: Self-pay

## 2019-11-10 ENCOUNTER — Ambulatory Visit (INDEPENDENT_AMBULATORY_CARE_PROVIDER_SITE_OTHER): Payer: Self-pay

## 2019-11-10 ENCOUNTER — Other Ambulatory Visit: Payer: Self-pay

## 2019-11-10 DIAGNOSIS — R0781 Pleurodynia: Secondary | ICD-10-CM

## 2019-11-10 DIAGNOSIS — S20211A Contusion of right front wall of thorax, initial encounter: Secondary | ICD-10-CM

## 2019-11-10 MED ORDER — HYDROCODONE-ACETAMINOPHEN 5-325 MG PO TABS: 1.0000 | ORAL_TABLET | Freq: Four times a day (QID) | ORAL | 0 refills | Status: DC | PRN

## 2019-11-10 MED ORDER — HYDROCODONE-ACETAMINOPHEN 5-325 MG PO TABS: 1.0000 | ORAL_TABLET | Freq: Four times a day (QID) | ORAL | 0 refills | Status: AC | PRN

## 2019-11-10 MED ORDER — HYDROCODONE-ACETAMINOPHEN 5-325 MG PO TABS
ORAL_TABLET | ORAL | Status: AC
  Filled 2019-11-10: qty 1

## 2019-11-10 MED ORDER — HYDROCODONE-ACETAMINOPHEN 5-325 MG PO TABS
1.0000 | ORAL_TABLET | Freq: Once | ORAL | Status: AC
  Administered 2019-11-10: 1 via ORAL

## 2019-11-10 NOTE — ED Triage Notes (Signed)
Patient reports falling on Tuesday. States all other problems have resolved, but still have right sided ribcage pain.

## 2019-11-10 NOTE — ED Provider Notes (Signed)
[place]    CSN: [id number] Arrival date & time: 11/10/19  [id number]      History   Chief Complaint Chief Complaint  Patient presents with  . Fall    HPI [name] [name] is a 59 y.o. female.   Pt is a 59 year old female that presents with right rib pain. This has been present since a fall 3 days ago. Reports that she fell on the right side. No other specific injuries.  No trouble breathing but has pain with deep breathing.  No fever, cough,  bruising or deformities.  Has not taken anything for symptoms.  ROS per HPI      Past Medical History:  Diagnosis Date  . Cervical disc disorder with radiculopathy of cervical region   . Chronic low back pain    HNP  . Chronic pain syndrome   . Degenerative joint disease   . Depression dx 1997  . Diabetes mellitus, type 2 ([place]) 2011   No insulin  . Diabetic polyneuropathy ([place])   . GERD (gastroesophageal reflux disease)   . Glaucoma   . Headache(784.0)   . Herniated disc   . Hypertension    Lab 11/2011:  CXR, EKG, CBC, TSH, BMet, troponin-normal; lipid profile: 188, 131, 36, 126   . Lumbar herniated disc   . Non-compliance   . Pancreatic cyst    Endoscopic aspiration in 09/2009  . Pneumonia 08/02/2012  . Shingles   . 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 Torn meniscus   . Vertigo     Patient Active Problem List   Diagnosis Date Noted  . Adrenal insufficiency ([place]) 09/07/2019  . Recurrent syncope 09/06/2019  . Sinus tachycardia 09/06/2019  . Hypokalemia 09/06/2019  . Pulmonary nodules 09/06/2019  . Orthostasis   . Syncope and collapse 06/30/2019  . Hypotension 06/29/2019  . Chronic pain syndrome 08/24/2017  . Diabetic polyneuropathy associated with type 2 diabetes mellitus ([place]) 08/24/2017  . Restless leg syndrome 08/24/2017  . Vertigo 04/09/2016  . Acute sinusitis 06/27/2015  . Cervical disc disorder with radiculopathy of cervical region 02/20/2015  . Neck muscle spasm 10/23/2014  . Healthcare  maintenance 10/23/2014  . Depression 11/25/2013  . Diabetes type 2, uncontrolled ([place])     Past Surgical History:  Procedure Laterality Date  . ABDOMINAL HYSTERECTOMY    . CESAREAN SECTION      OB History   No obstetric history on file.      Home Medications    Prior to Admission medications   Medication Sig Start Date End Date Taking? Authorizing Provider  acetaminophen (TYLENOL) 500 MG tablet Take 2 tablets (1,000 mg total) by mouth every 6 (six) hours as needed for mild pain or headache (pain). 07/02/19   Domenic Polite, MD  DULoxetine (CYMBALTA) 60 MG capsule Take 1 capsule (60 mg total) by mouth 2 (two) times daily. 06/28/19   Fulp, Cammie, MD  fludrocortisone (FLORINEF) 0.1 MG tablet TAKE 1 TABLET (0.1 MG TOTAL) BY MOUTH DAILY. 10/04/19   Lorretta Harp, MD  gabapentin (NEURONTIN) 600 MG tablet TAKE ONE TABLET (600 MG) BY MOUTH EVERY MORNING AND AFTERNOON, TAKE TWO TABLETS AT NIGHT 10/05/19   Charlott Rakes, MD  glucose blood test strip [facility] Testing Strips Use as instructed 08/18/17   Lockamy, Timothy, DO  HUMALOG KWIKPEN 100 UNIT/ML KwikPen Inject 0.1 mLs (10 Units total) into the skin 3 (three) times daily. Patient taking differently: Inject 10 Units into the skin  daily at 12 noon. Per sliding scale 03/15/19   Fulp, Cammie, MD  HYDROcodone-acetaminophen (NORCO/VICODIN) 5-325 MG tablet Take 1-2 tablets by mouth every 6 (six) hours as needed for up to 3 days. 11/10/19 11/13/19  Loura Halt A, NP  Insulin Glargine (LANTUS SOLOSTAR) 100 UNIT/ML Solostar Pen Inject 30 Units into the skin daily. 09/07/19   Black, Lezlie Octave, NP  Insulin Pen Needle (PEN NEEDLES) 31G X 8 MM MISC 1 Units by Does not apply route daily. 08/20/17   Nuala Alpha, DO  Insulin Pen Needle (PEN NEEDLES) 31G X 8 MM MISC Use as directed 09/07/19   Eugenie Filler, MD  midodrine (PROAMATINE) 10 MG tablet Take 1 tablet (10 mg total) by mouth 2 (two) times daily with a meal. 09/07/19   Black, Lezlie Octave, NP     Family History Family History  Problem Relation Age of Onset  . Depression Mother        type 1  . Diabetes Mother   . Renal Disease Mother   . Lung cancer Father   . Heart attack Brother        Died age 84 - was told due to massive MI/heart "exploded"  . Heart failure Maternal Grandmother        Details not totally clear    Social History Social History   Tobacco Use  . Smoking status: Former Smoker    Quit date: 08/03/1978    Years since quitting: 41.2  . Smokeless tobacco: Never Used  . Tobacco comment: Smoked age [age]  Substance Use Topics  . Alcohol use: No    Comment: Former  . Drug use: No     Allergies   Tramadol   Review of Systems Review of Systems   Physical Exam Triage Vital Signs ED Triage Vitals  Enc Vitals Group     BP 11/10/19 0859 113/74     Pulse Rate 11/10/19 0859 (!) 122     Resp 11/10/19 0859 (!) 22     Temp 11/10/19 0859 97.9 F (36.6 C)     Temp Source 11/10/19 0859 Oral     SpO2 11/10/19 0859 100 %     Weight --      Height --      Head Circumference --      Peak Flow --      Pain Score 11/10/19 0858 10     Pain Loc --      Pain Edu? --      Excl. in [place]? --    No data found.  Updated Vital Signs BP 113/74 (BP Location: Left Arm)   Pulse (!) 122   Temp 97.9 F (36.6 C) (Oral)   Resp (!) 22   SpO2 100%   Visual Acuity Right Eye Distance:   Left Eye Distance:   Bilateral Distance:    Right Eye Near:   Left Eye Near:    Bilateral Near:     Physical Exam Vitals and nursing note reviewed.  Constitutional:      General: She is not in acute distress.    Appearance: Normal appearance. She is not ill-appearing, toxic-appearing or diaphoretic.  HENT:     Head: Normocephalic.     Nose: Nose normal.  Eyes:     Conjunctiva/sclera: Conjunctivae normal.  Cardiovascular:     Rate and Rhythm: Normal rate and regular rhythm.  Pulmonary:     Effort: Pulmonary effort is normal.     Breath sounds: Normal breath sounds.  Chest:       Comments: TTP without bruising or deformity.  Musculoskeletal:        General: Normal range of motion.     Cervical back: Normal range of motion.  Skin:    General: Skin is warm and dry.     Findings: No rash.  Neurological:     Mental Status: She is alert.  Psychiatric:        Mood and Affect: Mood normal.      UC Treatments / Results  Labs (all labs ordered are listed, but only abnormal results are displayed) Labs Reviewed - No data to display  EKG   Radiology DG Ribs Unilateral W/Chest Right  Result Date: 11/10/2019 CLINICAL DATA:  Anterior right upper rib pain for 3 days since a fall. Initial encounter. EXAM: RIGHT RIBS AND CHEST - 3+ VIEW COMPARISON:  Single-view of the chest 06/30/2019. FINDINGS: Lungs clear. No pneumothorax or pleural effusion. Heart size is normal. No fracture or other bony abnormality. IMPRESSION: Negative exam. Electronically Signed   By: Inge Rise M.D.   On: 11/10/2019 10:35    Procedures Procedures (including critical care time)  Medications Ordered in UC Medications  HYDROcodone-acetaminophen (NORCO/VICODIN) 5-325 MG per tablet 1 tablet (1 tablet Oral Given 11/10/19 0904)    Initial Impression / Assessment and Plan / UC Course  I have reviewed the triage vital signs and the nursing notes.  Pertinent labs & imaging results that were available during my care of the patient were reviewed by me and considered in my medical decision making (see chart for details).     Rib contusion X-ray negative for fracture Reporting the hydrocodone we gave her here helped her pain.  We will send some more of this to the pharmacy to help with pain until this gets better. Recommended deep breathing exercises to avoid pneumonia Follow up as needed for continued or worsening symptoms  Final Clinical Impressions(s) / UC Diagnoses   Final diagnoses:  Contusion of rib on right side, initial encounter     Discharge Instructions      Your x ray did not show any fractures Most likely just really bad bruising.  We will give you something for pain to help Follow up as needed for continued or worsening symptoms     ED Prescriptions    Medication Sig Dispense Auth. Provider   HYDROcodone-acetaminophen (NORCO/VICODIN) 5-325 MG tablet  (Status: Discontinued) Take 1-2 tablets by mouth every 6 (six) hours as needed for up to 3 days. 12 tablet Antonyo Hinderer A, NP   HYDROcodone-acetaminophen (NORCO/VICODIN) 5-325 MG tablet Take 1-2 tablets by mouth every 6 (six) hours as needed for up to 3 days. 12 tablet Rokhaya Quinn A, NP     I have reviewed the PDMP during this encounter.   Orvan July, NP 11/10/19 1109

## 2019-11-10 NOTE — Discharge Instructions (Addendum)
Your x ray did not show any fractures Most likely just really bad bruising.  We will give you something for pain to help Follow up as needed for continued or worsening symptoms

## 2019-12-04 ENCOUNTER — Other Ambulatory Visit: Payer: Self-pay | Admitting: Family Medicine

## 2019-12-04 DIAGNOSIS — E1142 Type 2 diabetes mellitus with diabetic polyneuropathy: Secondary | ICD-10-CM

## 2019-12-04 DIAGNOSIS — E1165 Type 2 diabetes mellitus with hyperglycemia: Secondary | ICD-10-CM

## 2019-12-04 MED FILL — DULoxetine HCL 60 MG CPEP: 60 | 30 days supply | Qty: 60 | Fill #1

## 2019-12-05 MED FILL — FLUDROCORTISONE 0.1 MG TAB: 0.1 | 30 days supply | Qty: 30 | Fill #2

## 2019-12-06 MED FILL — GABAPENTIN 600 MG TABLET: 600 | 30 days supply | Qty: 120 | Fill #0

## 2019-12-08 ENCOUNTER — Inpatient Hospital Stay
Admission: EM | Admit: 2019-12-08 | Discharge: 2019-12-10 | DRG: 312 | Disposition: A | Discharge status: Home / Self Care | Payer: Medicaid Other | Attending: Internal Medicine | Admitting: Internal Medicine

## 2019-12-08 ENCOUNTER — Other Ambulatory Visit: Payer: Self-pay

## 2019-12-08 ENCOUNTER — Encounter: Payer: Self-pay | Admitting: *Deleted

## 2019-12-08 DIAGNOSIS — H409 Unspecified glaucoma: Secondary | ICD-10-CM | POA: Diagnosis present

## 2019-12-08 DIAGNOSIS — Z79899 Other long term (current) drug therapy: Secondary | ICD-10-CM

## 2019-12-08 DIAGNOSIS — I10 Essential (primary) hypertension: Secondary | ICD-10-CM | POA: Diagnosis present

## 2019-12-08 DIAGNOSIS — Z8249 Family history of ischemic heart disease and other diseases of the circulatory system: Secondary | ICD-10-CM

## 2019-12-08 DIAGNOSIS — G894 Chronic pain syndrome: Secondary | ICD-10-CM | POA: Diagnosis present

## 2019-12-08 DIAGNOSIS — G2581 Restless legs syndrome: Secondary | ICD-10-CM | POA: Diagnosis present

## 2019-12-08 DIAGNOSIS — Z794 Long term (current) use of insulin: Secondary | ICD-10-CM

## 2019-12-08 DIAGNOSIS — Z8701 Personal history of pneumonia (recurrent): Secondary | ICD-10-CM

## 2019-12-08 DIAGNOSIS — M1990 Unspecified osteoarthritis, unspecified site: Secondary | ICD-10-CM | POA: Diagnosis present

## 2019-12-08 DIAGNOSIS — I951 Orthostatic hypotension: Principal | ICD-10-CM | POA: Diagnosis present

## 2019-12-08 DIAGNOSIS — M5010 Cervical disc disorder with radiculopathy, unspecified cervical region: Secondary | ICD-10-CM | POA: Diagnosis present

## 2019-12-08 DIAGNOSIS — Z7952 Long term (current) use of systemic steroids: Secondary | ICD-10-CM

## 2019-12-08 DIAGNOSIS — E1165 Type 2 diabetes mellitus with hyperglycemia: Secondary | ICD-10-CM

## 2019-12-08 DIAGNOSIS — Z20822 Contact with and (suspected) exposure to covid-19: Secondary | ICD-10-CM | POA: Diagnosis present

## 2019-12-08 DIAGNOSIS — E1142 Type 2 diabetes mellitus with diabetic polyneuropathy: Secondary | ICD-10-CM | POA: Diagnosis present

## 2019-12-08 DIAGNOSIS — K219 Gastro-esophageal reflux disease without esophagitis: Secondary | ICD-10-CM | POA: Diagnosis present

## 2019-12-08 DIAGNOSIS — M5126 Other intervertebral disc displacement, lumbar region: Secondary | ICD-10-CM | POA: Diagnosis present

## 2019-12-08 DIAGNOSIS — F329 Major depressive disorder, single episode, unspecified: Secondary | ICD-10-CM | POA: Diagnosis present

## 2019-12-08 DIAGNOSIS — Z833 Family history of diabetes mellitus: Secondary | ICD-10-CM

## 2019-12-08 DIAGNOSIS — E1169 Type 2 diabetes mellitus with other specified complication: Secondary | ICD-10-CM

## 2019-12-08 DIAGNOSIS — Z87891 Personal history of nicotine dependence: Secondary | ICD-10-CM

## 2019-12-08 DIAGNOSIS — E876 Hypokalemia: Secondary | ICD-10-CM | POA: Diagnosis present

## 2019-12-08 DIAGNOSIS — Z885 Allergy status to narcotic agent status: Secondary | ICD-10-CM

## 2019-12-08 DIAGNOSIS — Z818 Family history of other mental and behavioral disorders: Secondary | ICD-10-CM

## 2019-12-08 DIAGNOSIS — E2740 Unspecified adrenocortical insufficiency: Secondary | ICD-10-CM | POA: Diagnosis present

## 2019-12-08 DIAGNOSIS — I959 Hypotension, unspecified: Secondary | ICD-10-CM | POA: Diagnosis present

## 2019-12-08 LAB — CBC
HCT: 35 % — ABNORMAL LOW (ref 36.0–46.0)
Hemoglobin: 12.3 g/dL (ref 12.0–15.0)
MCH: 31.9 pg (ref 26.0–34.0)
MCHC: 35.1 g/dL (ref 30.0–36.0)
MCV: 90.7 fL (ref 80.0–100.0)
Platelets: 182 10*3/uL (ref 150–400)
RBC: 3.86 MIL/uL — ABNORMAL LOW (ref 3.87–5.11)
RDW: 11.9 % (ref 11.5–15.5)
WBC: 5.4 10*3/uL (ref 4.0–10.5)
nRBC: 0 % (ref 0.0–0.2)

## 2019-12-08 LAB — URINALYSIS, COMPLETE (UACMP) WITH MICROSCOPIC
Bacteria, UA: NONE SEEN
Bilirubin Urine: NEGATIVE
Glucose, UA: >=500 mg/dL — AB
Hgb urine dipstick: NEGATIVE
Ketones, ur: 5 mg/dL — AB
Leukocytes,Ua: NEGATIVE
Nitrite: NEGATIVE
Protein, ur: NEGATIVE mg/dL
Specific Gravity, Urine: 1.016 (ref 1.005–1.030)
Squamous Epithelial / HPF: NONE SEEN (ref 0–5)
WBC, UA: NONE SEEN WBC/hpf (ref 0–5)
pH: 6 (ref 5.0–8.0)

## 2019-12-08 LAB — BASIC METABOLIC PANEL
Anion gap: 10 (ref 5–15)
BUN: 12 mg/dL (ref 6–20)
CO2: 24 mmol/L (ref 22–32)
Calcium: 8.6 mg/dL — ABNORMAL LOW (ref 8.9–10.3)
Chloride: 103 mmol/L (ref 98–111)
Creatinine, Ser: 0.42 mg/dL — ABNORMAL LOW (ref 0.44–1.00)
GFR calc Af Amer: >60 mL/min (ref >60)
GFR calc non Af Amer: >60 mL/min (ref >60)
Glucose, Bld: 151 mg/dL — ABNORMAL HIGH (ref 70–99)
Potassium: 2.9 mmol/L — ABNORMAL LOW (ref 3.5–5.1)
Sodium: 137 mmol/L (ref 135–145)

## 2019-12-08 LAB — SARS CORONAVIRUS 2 BY RT PCR (HOSPITAL ORDER, PERFORMED IN ~~LOC~~ HOSPITAL LAB): SARS Coronavirus 2: NEGATIVE

## 2019-12-08 LAB — GLUCOSE, CAPILLARY
Glucose-Capillary: 240 mg/dL — ABNORMAL HIGH (ref 70–99)
Glucose-Capillary: 466 mg/dL — ABNORMAL HIGH (ref 70–99)
Glucose-Capillary: 216 mg/dL — ABNORMAL HIGH (ref 70–99)
Glucose-Capillary: 199 mg/dL — ABNORMAL HIGH (ref 70–99)

## 2019-12-08 LAB — MAGNESIUM: Magnesium: 1.9 mg/dL (ref 1.7–2.4)

## 2019-12-08 MED ORDER — ENOXAPARIN SODIUM 40 MG/0.4ML ~~LOC~~ SOLN
40.0000 mg | SUBCUTANEOUS | Status: DC
  Administered 2019-12-08 – 2019-12-09 (×2): 40 mg via SUBCUTANEOUS
  Filled 2019-12-08 (×3): qty 0.4

## 2019-12-08 MED ORDER — FLUDROCORTISONE ACETATE 0.1 MG PO TABS
0.1000 mg | ORAL_TABLET | Freq: Every day | ORAL | Status: DC
  Administered 2019-12-08 – 2019-12-10 (×3): 0.1 mg via ORAL
  Filled 2019-12-08 (×3): qty 1

## 2019-12-08 MED ORDER — HYDROCORTISONE NICU INJ SYRINGE 50 MG/ML: 0.4000 mg/kg | Freq: Three times a day (TID) | INTRAVENOUS | Status: DC

## 2019-12-08 MED ORDER — GABAPENTIN 600 MG PO TABS
600.0000 mg | ORAL_TABLET | Freq: Two times a day (BID) | ORAL | Status: DC
  Administered 2019-12-08 – 2019-12-10 (×5): 600 mg via ORAL
  Filled 2019-12-08 (×5): qty 1

## 2019-12-08 MED ORDER — MIDODRINE HCL 5 MG PO TABS
10.0000 mg | ORAL_TABLET | Freq: Two times a day (BID) | ORAL | Status: DC
  Administered 2019-12-08 – 2019-12-10 (×4): 10 mg via ORAL
  Filled 2019-12-08 (×7): qty 2

## 2019-12-08 MED ORDER — POTASSIUM CHLORIDE 20 MEQ PO PACK
40.0000 meq | PACK | Freq: Once | ORAL | Status: AC
  Administered 2019-12-08: 40 meq via ORAL
  Filled 2019-12-08: qty 2

## 2019-12-08 MED ORDER — ONDANSETRON HCL 4 MG/2ML IJ SOLN: 4.0000 mg | Freq: Four times a day (QID) | INTRAMUSCULAR | Status: DC | PRN

## 2019-12-08 MED ORDER — SODIUM CHLORIDE 0.9% FLUSH: 3.0000 mL | Freq: Once | INTRAVENOUS | Status: DC

## 2019-12-08 MED ORDER — ACETAMINOPHEN 325 MG PO TABS
650.0000 mg | ORAL_TABLET | Freq: Four times a day (QID) | ORAL | Status: DC | PRN
  Administered 2019-12-09 – 2019-12-10 (×2): 650 mg via ORAL
  Filled 2019-12-08 (×2): qty 2

## 2019-12-08 MED ORDER — INSULIN ASPART 100 UNIT/ML ~~LOC~~ SOLN
0.0000 [IU] | Freq: Three times a day (TID) | SUBCUTANEOUS | Status: DC
  Filled 2019-12-08: qty 1

## 2019-12-08 MED ORDER — ONDANSETRON HCL 4 MG PO TABS: 4.0000 mg | ORAL_TABLET | Freq: Four times a day (QID) | ORAL | Status: DC | PRN

## 2019-12-08 MED ORDER — SODIUM CHLORIDE 0.9 % IV SOLN: INTRAVENOUS | Status: DC

## 2019-12-08 MED ORDER — POTASSIUM CHLORIDE CRYS ER 20 MEQ PO TBCR
40.0000 meq | EXTENDED_RELEASE_TABLET | Freq: Once | ORAL | Status: AC
  Administered 2019-12-08: 40 meq via ORAL
  Filled 2019-12-08: qty 2

## 2019-12-08 MED ORDER — SODIUM CHLORIDE 0.9 % IV BOLUS
1000.0000 mL | Freq: Once | INTRAVENOUS | Status: AC
  Administered 2019-12-08: 1000 mL via INTRAVENOUS

## 2019-12-08 MED ORDER — INSULIN GLARGINE 100 UNIT/ML ~~LOC~~ SOLN
30.0000 [IU] | Freq: Every day | SUBCUTANEOUS | Status: DC
  Administered 2019-12-08 – 2019-12-10 (×2): 30 [IU] via SUBCUTANEOUS
  Filled 2019-12-08 (×4): qty 0.3

## 2019-12-08 MED ORDER — ACETAMINOPHEN 650 MG RE SUPP: 650.0000 mg | Freq: Four times a day (QID) | RECTAL | Status: DC | PRN

## 2019-12-08 MED ORDER — DULOXETINE HCL 30 MG PO CPEP
60.0000 mg | ORAL_CAPSULE | Freq: Two times a day (BID) | ORAL | Status: DC
  Administered 2019-12-08 – 2019-12-10 (×5): 60 mg via ORAL
  Filled 2019-12-08 (×2): qty 2
  Filled 2019-12-08: qty 1
  Filled 2019-12-08 (×2): qty 2

## 2019-12-08 MED ORDER — INSULIN ASPART 100 UNIT/ML ~~LOC~~ SOLN
0.0000 [IU] | Freq: Three times a day (TID) | SUBCUTANEOUS | Status: DC
  Administered 2019-12-08: 15 [IU] via SUBCUTANEOUS
  Administered 2019-12-08: 19:00:00 5 [IU] via SUBCUTANEOUS
  Administered 2019-12-09: 3 [IU] via SUBCUTANEOUS
  Administered 2019-12-09: 5 [IU] via SUBCUTANEOUS
  Administered 2019-12-09 – 2019-12-10 (×2): 3 [IU] via SUBCUTANEOUS
  Administered 2019-12-10: 5 [IU] via SUBCUTANEOUS
  Filled 2019-12-08 (×6): qty 1

## 2019-12-08 MED ORDER — HYDROCORTISONE NA SUCCINATE PF 100 MG IJ SOLR
25.0000 mg | Freq: Three times a day (TID) | INTRAMUSCULAR | Status: DC
  Administered 2019-12-08 – 2019-12-09 (×3): 25 mg via INTRAVENOUS
  Filled 2019-12-08: qty 2
  Filled 2019-12-08 (×2): qty 0.5

## 2019-12-08 MED ORDER — MAGNESIUM HYDROXIDE 400 MG/5ML PO SUSP
30.0000 mL | Freq: Every day | ORAL | Status: DC | PRN
  Filled 2019-12-08: qty 30

## 2019-12-08 MED ORDER — INSULIN ASPART 100 UNIT/ML ~~LOC~~ SOLN
10.0000 [IU] | Freq: Three times a day (TID) | SUBCUTANEOUS | Status: DC
  Administered 2019-12-08 – 2019-12-10 (×6): 10 [IU] via SUBCUTANEOUS
  Filled 2019-12-08 (×6): qty 1

## 2019-12-08 MED ORDER — TRAZODONE HCL 50 MG PO TABS
25.0000 mg | ORAL_TABLET | Freq: Every evening | ORAL | Status: DC | PRN
  Administered 2019-12-08 – 2019-12-09 (×2): 25 mg via ORAL
  Filled 2019-12-08 (×2): qty 1

## 2019-12-08 MED ORDER — HYDROCORTISONE NA SUCCINATE PF 100 MG IJ SOLR
100.0000 mg | Freq: Once | INTRAMUSCULAR | Status: AC
  Administered 2019-12-08: 100 mg via INTRAVENOUS
  Filled 2019-12-08: qty 2
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
## 2019-12-08 NOTE — ED Notes (Signed)
Report called to ashley rn floor nurse °

## 2019-12-08 NOTE — ED Triage Notes (Signed)
Pt called ems due to hypotension.  Pt lives at home with family and ems was told by family that pt has hx of orthostatic hypotension and she has had to come to the hospital many times due to this and is usually discharged after a few bags of fluids.  Pt was orthostatic for ems.  She was [id number] systolic laying down and dropped to 94/60 when she sat up.  Pt denies any CP or sob with this.  Pt is alert and oriented. Pt has 18g in [place] and had 1l NS from EMS
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
## 2019-12-08 NOTE — ED Notes (Signed)
Called chaplain to consult w/ this pt.

## 2019-12-08 NOTE — ED Notes (Signed)
Provided pt w/ phone to speak w/ son.

## 2019-12-08 NOTE — Progress Notes (Signed)
PT Cancellation Note  Patient Details Name: [name] [name] MRN: [id number] DOB: 1961/05/01   Cancelled Treatment:    Reason Eval/Treat Not Completed: Medical issues which prohibited therapy(Chart reviewed, noted K+: 2.9 outside of safe range for PT services. Pt also with resting tachycardia, 120bpm. Will attempt PT evaluation again at later date/time.)  3:53 PM, 12/08/19 Etta Grandchild, PT, DPT Physical Therapist - [facility]  [phone] ([place])    [place] C 12/08/2019, 3:53 PM

## 2019-12-08 NOTE — ED Notes (Signed)
Pt seem ssleepy.  She is arousable to verbal stimuli but very softspoken and appears to want to sleep.  Placed pt in recliner in [place].  Warm blanket given as well as call bell.

## 2019-12-08 NOTE — H&P (Addendum)
[facility] [place] NAME: [name] [name]    MR#:  [id number]  DATE OF BIRTH:  06-14-1961  DATE OF ADMISSION:  12/08/2019  PRIMARY CARE PHYSICIAN: Antony Blackbird, MD   REQUESTING/REFERRING PHYSICIAN: Marjean Donna, MD  CHIEF COMPLAINT:   Chief Complaint  Patient presents with  . Hypotension    HISTORY OF PRESENT ILLNESS:  [name] [name]  is a 59 y.o. Caucasian female with a known history of adrenal insufficiency, type diabetes mellitus, peripheral neuropathy, GERD, hypertension and chronic back pain, who presented to the emergency room with acute onset of hypotension with systolic blood pressure of 69 by EMS.  She was orthostatic for them.  She admitted to mild headache and dizziness as well as a syncope prior to their arrival.  No fever or chills or nausea or vomiting.  No diarrhea or melena or bright red bleeding per rectum.  No bleeding diathesis.  Upon presentation to the emergency room, blood pressure was 138/75 with a heart rate of 111.  The patient's blood pressure in the supine position was 127/82 and dropped to 111/76 in sitting position and was down to 102/55 in standing position.  Heart rate was 110, 112 then 114.  Labs revealed hypokalemia of 2.9 with a sodium of 137 chloride 103.  Blood glucose was 151.  BUN was 12 with creatinine of 0.42.  CBC was unremarkable.  The patient was given 2 L bolus of IV normal saline, 100 mg of IV Solu-Cortef and 40 mEq of p.o. potassium chloride.  He will be admitted to an observation medical monitor bed for further evaluation and management. PAST MEDICAL HISTORY:   Past Medical History:  Diagnosis Date  . Cervical disc disorder with radiculopathy of cervical region   . Chronic low back pain    HNP  . Chronic pain syndrome   . Degenerative joint disease   . Depression dx 1997  . Diabetes mellitus, type 2 ([place]) 2011   No insulin  . Diabetic polyneuropathy ([place])   . GERD (gastroesophageal reflux disease)   .  Glaucoma   . Headache(784.0)   . Herniated disc   . Hypertension    Lab 11/2011:  CXR, EKG, CBC, TSH, BMet, troponin-normal; lipid profile: 188, 131, 36, 126   . Lumbar herniated disc   . Non-compliance   . Pancreatic cyst    Endoscopic aspiration in 09/2009  . Pneumonia 08/02/2012  . Shingles   . 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 Torn meniscus   . Vertigo     PAST SURGICAL HISTORY:   Past Surgical History:  Procedure Laterality Date  . ABDOMINAL HYSTERECTOMY    . CESAREAN SECTION      SOCIAL HISTORY:   Social History   Tobacco Use  . Smoking status: Former Smoker    Quit date: 08/03/1978    Years since quitting: 41.3  . Smokeless tobacco: Never Used  . Tobacco comment: Smoked age [age]  Substance Use Topics  . Alcohol use: No    Comment: Former    FAMILY HISTORY:   Family History  Problem Relation Age of Onset  . Depression Mother        type 1  . Diabetes Mother   . Renal Disease Mother   . Lung cancer Father   . Heart attack Brother        Died age 26 - was told due to massive MI/heart "exploded"  . Heart failure  Maternal Grandmother        Details not totally clear    DRUG ALLERGIES:   Allergies  Allergen Reactions  . Tramadol Nausea And Vomiting    REACTION: Projectile vomiting    REVIEW OF SYSTEMS:   ROS As per history of present illness. All pertinent systems were reviewed above. Constitutional,  HEENT, cardiovascular, respiratory, GI, GU, musculoskeletal, neuro, psychiatric, endocrine,  integumentary and hematologic systems were reviewed and are otherwise  negative/unremarkable except for positive findings mentioned above in the HPI.   MEDICATIONS AT HOME:   Prior to Admission medications   Medication Sig Start Date End Date Taking? Authorizing Provider  gabapentin (NEURONTIN) 600 MG tablet TAKE ONE TABLET (600 MG) BY MOUTH EVERY MORNING AND AFTERNOON, TAKE TWO TABLETS AT NIGHT 12/05/19   Fulp, Cammie, MD  acetaminophen  (TYLENOL) 500 MG tablet Take 2 tablets (1,000 mg total) by mouth every 6 (six) hours as needed for mild pain or headache (pain). 07/02/19   Domenic Polite, MD  DULoxetine (CYMBALTA) 60 MG capsule Take 1 capsule (60 mg total) by mouth 2 (two) times daily. 06/28/19   Fulp, Cammie, MD  fludrocortisone (FLORINEF) 0.1 MG tablet TAKE 1 TABLET (0.1 MG TOTAL) BY MOUTH DAILY. 10/04/19   Lorretta Harp, MD  glucose blood test strip [employer] Testing Strips Use as instructed 08/18/17   Lockamy, Timothy, DO  HUMALOG KWIKPEN 100 UNIT/ML KwikPen Inject 0.1 mLs (10 Units total) into the skin 3 (three) times daily. Patient taking differently: Inject 10 Units into the skin daily at 12 noon. Per sliding scale 03/15/19   Fulp, Cammie, MD  Insulin Glargine (LANTUS SOLOSTAR) 100 UNIT/ML Solostar Pen Inject 30 Units into the skin daily. 09/07/19   Black, Lezlie Octave, NP  Insulin Pen Needle (PEN NEEDLES) 31G X 8 MM MISC 1 Units by Does not apply route daily. 08/20/17   Nuala Alpha, DO  Insulin Pen Needle (PEN NEEDLES) 31G X 8 MM MISC Use as directed 09/07/19   Eugenie Filler, MD  midodrine (PROAMATINE) 10 MG tablet Take 1 tablet (10 mg total) by mouth 2 (two) times daily with a meal. 09/07/19   Black, Lezlie Octave, NP      VITAL SIGNS:  Blood pressure 138/75, pulse (!) 111, temperature 98.2 F (36.8 C), temperature source Oral, resp. rate 14, weight 62.6 kg, SpO2 98 %.  PHYSICAL EXAMINATION:  Physical Exam  GENERAL:  59 y.o.-year-old Caucasian female patient lying in the bed with no acute distress.  EYES: Pupils equal, round, reactive to light and accommodation. No scleral icterus. Extraocular muscles intact.  HEENT: Head atraumatic, normocephalic. Oropharynx and nasopharynx clear.  NECK:  Supple, no jugular venous distention. No thyroid enlargement, no tenderness.  LUNGS: Normal breath sounds bilaterally, no wheezing, rales,rhonchi or crepitation. No use of accessory muscles of respiration.  CARDIOVASCULAR: Regular rate  and rhythm, S1, S2 normal. No murmurs, rubs, or gallops.  ABDOMEN: Soft, nondistended, nontender. Bowel sounds present. No organomegaly or mass.  EXTREMITIES: No pedal edema, cyanosis, or clubbing.  NEUROLOGIC: Cranial nerves II through XII are intact. Muscle strength 5/5 in all extremities. Sensation intact. Gait not checked.  PSYCHIATRIC: The patient is alert and oriented x 3.  Normal affect and good eye contact. SKIN: No obvious rash, lesion, or ulcer.   LABORATORY PANEL:   CBC Recent Labs  Lab 12/08/19 0035  WBC 5.4  HGB 12.3  HCT 35.0*  PLT 182   ------------------------------------------------------------------------------------------------------------------  Chemistries  Recent Labs  Lab 12/08/19 0035  NA  137  K 2.9*  CL 103  CO2 24  GLUCOSE 151*  BUN 12  CREATININE 0.42*  CALCIUM 8.6*   ------------------------------------------------------------------------------------------------------------------  Cardiac Enzymes No results for input(s): TROPONINI in the last 168 hours. ------------------------------------------------------------------------------------------------------------------  RADIOLOGY:  No results found.    IMPRESSION AND PLAN:   1.  Orthostatic hypotension, like secondary to adrenal insufficiency. -The patient will be admitted to an observation medical monitored bed. -We will continue hydration with IV normal saline. -We will continue IV Solu-Cortef. -We will continue ProAmatine and Florinef. -We will follow her orthostatics. -She had a recent 2D echo in December 2020 which was normal and showed an EF of 60 to 65%..  2.  Hypokalemia. -This likely in the setting of adrenal insufficiency. -Potassium was replaced and will be followed. -We will check magnesium level and replace as needed.  3.  Type 2 diabetes mellitus with peripheral neuropathy -The patient will be placed on supplemental coverage with NovoLog and will continue basal  coverage. -We will continue Neurontin and Cymbalta.  4. DVT prophylaxis. Subcutaneous Lovenox.   All the records are reviewed and case discussed with ED provider. The plan of care was discussed in details with the patient (and family). I answered all questions. The patient agreed to proceed with the above mentioned plan. Further management will depend upon hospital course.   CODE STATUS: Full code  Status is: Observation  The patient remains OBS appropriate and will d/c before 2 midnights.  Dispo: The patient is from: Home              Anticipated d/c is to: Home              Anticipated d/c date is: 1 day              Patient currently is not medically stable to d/c.    TOTAL TIME TAKING CARE OF THIS PATIENT: 50 minutes.    Christel Mormon M.D on 12/08/2019 at 6:11 AM  [facility]   From 7 PM-7 AM, contact night-coverage [email or web address]  CC: Primary care physician; Antony Blackbird, MD   Note: This dictation was prepared with Dragon dictation along with smaller phrase technology. Any transcriptional errors that result from this process are unintentional.

## 2019-12-08 NOTE — Progress Notes (Signed)
PROGRESS NOTE    [name] [name]  [id number] DOB: Oct 16, 1960 DOA: 12/08/2019 PCP: Antony Blackbird, MD       Assessment & Plan:   Active Problems:   Hypotension   Orthostatic hypotension: still positive today. Like secondary to adrenal insufficiency. Will continue IV solu-cortef, midodrine & florinef. Echo in December 2020 which was normal and showed an EF of 60 to 65%. Recommend pt see an endocrinologist as an outpatient for further evaluation & treatment for possible adrenal insufficiency. Pt verbalized her understanding   Hypokalemia: KCl repleted. Will continue   DM2: continue on lantus &  Increased SSI to moderate scale w/ accuchecks  Peripheral neuropathy: will continue on duloxetine & gabapentin    DVT prophylaxis: lovenox Code Status: full Family Communication:  Disposition Plan: depends on PT/OT recs    Consultants:    Procedures:   Antimicrobials:   Subjective: Pt c/o feeling off balance when standing up and walking   Objective: Vitals:   12/08/19 0630 12/08/19 0700 12/08/19 0730 12/08/19 0800  BP: (!) 145/91 134/90 119/78 (!) 131/93  Pulse: (!) 105 (!) 104 (!) 103 (!) 104  Resp: 19 13 16 12   Temp:      TempSrc:      SpO2: 97% 98% 97% 97%  Weight:        Intake/Output Summary (Last 24 hours) at 12/08/2019 0812 Last data filed at 12/08/2019 0610 Gross per 24 hour  Intake 2000 ml  Output --  Net 2000 ml   Filed Weights   12/08/19 0033  Weight: 62.6 kg    Examination:  General exam: Appears calm and comfortable  Respiratory system: Clear to auscultation. Respiratory effort normal. Cardiovascular system: S1 & S2+. No rubs, gallops or clicks. No pedal edema. Gastrointestinal system: Abdomen is nondistended, soft and nontender. Normal bowel sounds heard. Central nervous system: Alert and oriented. Moves all 4 extremities  Psychiatry: Judgement and insight appear normal. Mood & affect appropriate.     Data Reviewed: I have personally  reviewed following labs and imaging studies  CBC: Recent Labs  Lab 12/08/19 0035  WBC 5.4  HGB 12.3  HCT 35.0*  MCV 90.7  PLT [id number]   Basic Metabolic Panel: Recent Labs  Lab 12/08/19 0035  NA 137  K 2.9*  CL 103  CO2 24  GLUCOSE 151*  BUN 12  CREATININE 0.42*  CALCIUM 8.6*   GFR: Estimated Creatinine Clearance: 70.9 mL/min (A) (by C-G formula based on SCr of 0.42 mg/dL (L)). Liver Function Tests: No results for input(s): AST, ALT, ALKPHOS, BILITOT, PROT, ALBUMIN in the last 168 hours. No results for input(s): LIPASE, AMYLASE in the last 168 hours. No results for input(s): AMMONIA in the last 168 hours. Coagulation Profile: No results for input(s): INR, PROTIME in the last 168 hours. Cardiac Enzymes: No results for input(s): CKTOTAL, CKMB, CKMBINDEX, TROPONINI in the last 168 hours. BNP (last 3 results) No results for input(s): PROBNP in the last 8760 hours. HbA1C: No results for input(s): HGBA1C in the last 72 hours. CBG: No results for input(s): GLUCAP in the last 168 hours. Lipid Profile: No results for input(s): CHOL, HDL, LDLCALC, TRIG, CHOLHDL, LDLDIRECT in the last 72 hours. Thyroid Function Tests: No results for input(s): TSH, T4TOTAL, FREET4, T3FREE, THYROIDAB in the last 72 hours. Anemia Panel: No results for input(s): VITAMINB12, FOLATE, FERRITIN, TIBC, IRON, RETICCTPCT in the last 72 hours. Sepsis Labs: No results for input(s): PROCALCITON, LATICACIDVEN in the last 168 hours.  No results found for this or  any previous visit (from the past 240 hour(s)).       Radiology Studies: No results found.      Scheduled Meds: . DULoxetine  60 mg Oral BID  . enoxaparin (LOVENOX) injection  40 mg Subcutaneous Q24H  . fludrocortisone  0.1 mg Oral Daily  . gabapentin  600 mg Oral BID  . hydrocortisone sod succinate (SOLU-CORTEF) inj  25 mg Intravenous Q8H  . insulin glargine  30 Units Subcutaneous Daily  . midodrine  10 mg Oral BID WC  . potassium  chloride  40 mEq Oral Once  . sodium chloride flush  3 mL Intravenous Once   Continuous Infusions: . sodium chloride       LOS: 0 days    Time spent: 33 mins     Wyvonnia Dusky, MD [facility] Pager [phone]  If 7PM-7AM, please contact night-coverage [email or web address] 12/08/2019, 8:12 AM

## 2019-12-08 NOTE — ED Notes (Signed)
Assigned bed @ 1338, spoke with RN Janett Billow.

## 2019-12-08 NOTE — ED Notes (Signed)
Chaplain at bedside at this time.

## 2019-12-08 NOTE — ED Provider Notes (Signed)
[facility] Emergency Department Provider Note  ____________________________________________   First MD Initiated Contact with Patient 12/08/19 0421     (approximate)  I have reviewed the triage vital signs and the nursing notes.   HISTORY  Chief Complaint Hypotension   HPI [name] [name] is a 59 y.o. female with below list of previous medical conditions including orthostatic hypotension and phonic adrenal insufficiency presents to the emergency department via EMS secondary to hypotension noted at home with systolic blood pressure in the 0000000 diastolic in the [id number] per the patient.  Patient also states that she felt generalized weakness inability to sit up.  Patient denies any pain.  Patient denies any chest pain or shortness of breath no headache no nausea or vomiting.  Patient denies any recent diarrhea.       Past Medical History:  Diagnosis Date  . Cervical disc disorder with radiculopathy of cervical region   . Chronic low back pain    HNP  . Chronic pain syndrome   . Degenerative joint disease   . Depression dx 1997  . Diabetes mellitus, type 2 ([place]) 2011   No insulin  . Diabetic polyneuropathy ([place])   . GERD (gastroesophageal reflux disease)   . Glaucoma   . Headache(784.0)   . Herniated disc   . Hypertension    Lab 11/2011:  CXR, EKG, CBC, TSH, BMet, troponin-normal; lipid profile: 188, 131, 36, 126   . Lumbar herniated disc   . Non-compliance   . Pancreatic cyst    Endoscopic aspiration in 09/2009  . Pneumonia 08/02/2012  . Shingles   . 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 Torn meniscus   . Vertigo     Patient Active Problem List   Diagnosis Date Noted  . Adrenal insufficiency ([place]) 09/07/2019  . Recurrent syncope 09/06/2019  . Sinus tachycardia 09/06/2019  . Hypokalemia 09/06/2019  . Pulmonary nodules 09/06/2019  . Orthostasis   . Syncope and collapse 06/30/2019  . Hypotension 06/29/2019  . Chronic pain syndrome  08/24/2017  . Diabetic polyneuropathy associated with type 2 diabetes mellitus ([place]) 08/24/2017  . Restless leg syndrome 08/24/2017  . Vertigo 04/09/2016  . Acute sinusitis 06/27/2015  . Cervical disc disorder with radiculopathy of cervical region 02/20/2015  . Neck muscle spasm 10/23/2014  . Healthcare maintenance 10/23/2014  . Depression 11/25/2013  . Diabetes type 2, uncontrolled ([place])     Past Surgical History:  Procedure Laterality Date  . ABDOMINAL HYSTERECTOMY    . CESAREAN SECTION      Prior to Admission medications   Medication Sig Start Date End Date Taking? Authorizing Provider  gabapentin (NEURONTIN) 600 MG tablet TAKE ONE TABLET (600 MG) BY MOUTH EVERY MORNING AND AFTERNOON, TAKE TWO TABLETS AT NIGHT 12/05/19   Fulp, Cammie, MD  acetaminophen (TYLENOL) 500 MG tablet Take 2 tablets (1,000 mg total) by mouth every 6 (six) hours as needed for mild pain or headache (pain). 07/02/19   Domenic Polite, MD  DULoxetine (CYMBALTA) 60 MG capsule Take 1 capsule (60 mg total) by mouth 2 (two) times daily. 06/28/19   Fulp, Cammie, MD  fludrocortisone (FLORINEF) 0.1 MG tablet TAKE 1 TABLET (0.1 MG TOTAL) BY MOUTH DAILY. 10/04/19   Lorretta Harp, MD  glucose blood test strip [employer] Testing Strips Use as instructed 08/18/17   Lockamy, Timothy, DO  HUMALOG KWIKPEN 100 UNIT/ML KwikPen Inject 0.1 mLs (10 Units total) into the skin 3 (three) times daily. Patient taking  differently: Inject 10 Units into the skin daily at 12 noon. Per sliding scale 03/15/19   Fulp, Cammie, MD  Insulin Glargine (LANTUS SOLOSTAR) 100 UNIT/ML Solostar Pen Inject 30 Units into the skin daily. 09/07/19   Black, Lezlie Octave, NP  Insulin Pen Needle (PEN NEEDLES) 31G X 8 MM MISC 1 Units by Does not apply route daily. 08/20/17   Nuala Alpha, DO  Insulin Pen Needle (PEN NEEDLES) 31G X 8 MM MISC Use as directed 09/07/19   Eugenie Filler, MD  midodrine (PROAMATINE) 10 MG tablet Take 1 tablet (10 mg total) by mouth 2 (two)  times daily with a meal. 09/07/19   Black, Lezlie Octave, NP    Allergies Tramadol  Family History  Problem Relation Age of Onset  . Depression Mother        type 1  . Diabetes Mother   . Renal Disease Mother   . Lung cancer Father   . Heart attack Brother        Died age 77 - was told due to massive MI/heart "exploded"  . Heart failure Maternal Grandmother        Details not totally clear    Social History Social History   Tobacco Use  . Smoking status: Former Smoker    Quit date: 08/03/1978    Years since quitting: 41.3  . Smokeless tobacco: Never Used  . Tobacco comment: Smoked age [age]  Substance Use Topics  . Alcohol use: No    Comment: Former  . Drug use: No    Review of Systems Constitutional: No fever/chills Eyes: No visual changes. ENT: No sore throat. Cardiovascular: Denies chest pain. Respiratory: Denies shortness of breath. Gastrointestinal: No abdominal pain.  No nausea, no vomiting.  No diarrhea.  No constipation. Genitourinary: Negative for dysuria. Musculoskeletal: Negative for neck pain.  Negative for back pain. Integumentary: Negative for rash. Neurological: Negative for headaches, focal weakness or numbness.  Positive for generalized weakness   ____________________________________________   PHYSICAL EXAM:  VITAL SIGNS: ED Triage Vitals  Enc Vitals Group     BP 12/08/19 0035 138/75     Pulse Rate 12/08/19 0035 (!) 111     Resp 12/08/19 0035 14     Temp 12/08/19 0035 98.2 F (36.8 C)     Temp Source 12/08/19 0035 Oral     SpO2 12/08/19 0035 98 %     Weight 12/08/19 0033 62.6 kg (138 lb)     Height --      Head Circumference --      Peak Flow --      Pain Score 12/08/19 0029 0     Pain Loc --      Pain Edu? --      Excl. in [place]? --     Constitutional: Alert and oriented.  Eyes: Conjunctivae are normal.  Head: Atraumatic. Mouth/Throat: Patient is wearing a mask. Neck: No stridor.  No meningeal signs.   Cardiovascular: Normal rate,  regular rhythm. Good peripheral circulation. Grossly normal heart sounds. Respiratory: Normal respiratory effort.  No retractions. Gastrointestinal: Soft and nontender. No distention.  Musculoskeletal: No lower extremity tenderness nor edema. No gross deformities of extremities. Neurologic:  Normal speech and language. No gross focal neurologic deficits are appreciated.  Skin: Hyperpigmentation Psychiatric: Mood and affect are normal. Speech and behavior are normal.  ____________________________________________   LABS (all labs ordered are listed, but only abnormal results are displayed)  Labs Reviewed  BASIC METABOLIC PANEL - Abnormal; Notable for the  following components:      Result Value   Potassium 2.9 (*)    Glucose, Bld 151 (*)    Creatinine, Ser 0.42 (*)    Calcium 8.6 (*)    All other components within normal limits  CBC - Abnormal; Notable for the following components:   RBC 3.86 (*)    HCT 35.0 (*)    All other components within normal limits  URINALYSIS, COMPLETE (UACMP) WITH MICROSCOPIC  CBG MONITORING, ED  POC URINE PREG, ED     Procedures   ____________________________________________   INITIAL IMPRESSION / MDM / ASSESSMENT AND PLAN / ED COURSE  As part of my medical decision making, I reviewed the following data within the electronic MEDICAL RECORD NUMBER   59 year old female presented with above-stated history and physical exam consistent with orthostatic hypotension most likely secondary to known history of adrenal insufficiency with associated generalized weakness.  Patient given 2 L IV normal saline however persistently hypotensive.  Patient also given potassium chloride 40 mEq p.o.  Given persistent hypotension and orthostasis patient given Solu-Cortef 100 mg IV and subsequently discussed with the hospitalist after admission for further evaluation and management.  ____________________________________________  FINAL CLINICAL IMPRESSION(S) / ED  DIAGNOSES  Final diagnoses:  Adrenal insufficiency (H[facility])  Orthostatic hypotension     MEDICATIONS GIVEN DURING THIS VISIT:  Medications  sodium chloride flush (NS) 0.9 % injection 3 mL (3 mLs Intravenous Not Given 12/08/19 0053)  midodrine (PROAMATINE) tablet 10 mg (has no administration in time range)  DULoxetine (CYMBALTA) DR capsule 60 mg (has no administration in time range)  fludrocortisone (FLORINEF) tablet 0.1 mg (has no administration in time range)  insulin glargine (LANTUS) Solostar Pen 30 Units (has no administration in time range)  gabapentin (NEURONTIN) tablet 600 mg (has no administration in time range)  enoxaparin (LOVENOX) injection 40 mg (has no administration in time range)  0.9 %  sodium chloride infusion (has no administration in time range)  acetaminophen (TYLENOL) tablet 650 mg (has no administration in time range)    Or  acetaminophen (TYLENOL) suppository 650 mg (has no administration in time range)  traZODone (DESYREL) tablet 25 mg (has no administration in time range)  magnesium hydroxide (MILK OF MAGNESIA) suspension 30 mL (has no administration in time range)  ondansetron (ZOFRAN) tablet 4 mg (has no administration in time range)    Or  ondansetron (ZOFRAN) injection 4 mg (has no administration in time range)  sodium chloride 0.9 % bolus 1,000 mL (0 mLs Intravenous Stopped 12/08/19 0200)  sodium chloride 0.9 % bolus 1,000 mL (0 mLs Intravenous Stopped 12/08/19 0610)  potassium chloride SA (KLOR-CON) CR tablet 40 mEq (40 mEq Oral Given 12/08/19 0455)  hydrocortisone sodium succinate (SOLU-CORTEF) 100 MG injection 100 mg (100 mg Intravenous Given 12/08/19 0541)     ED Discharge Orders    None      *Please note:  Lanisha Safko was evaluated in Emergency Department on 12/08/2019 for the symptoms described in the history of present illness. She was evaluated in the context of the global COVID-19 pandemic, which necessitated consideration that the patient might be  at risk for infection with the SARS-CoV-2 virus that causes COVID-19. Institutional protocols and algorithms that pertain to the evaluation of patients at risk for COVID-19 are in a state of rapid change based on information released by regulatory bodies including the CDC and federal and state organizations. These policies and algorithms were followed during the patient's care in the ED.  Some ED  evaluations and interventions may be delayed as a result of limited staffing during the pandemic.*  Note:  This document was prepared using Dragon voice recognition software and may include unintentional dictation errors.   Gregor Hams, MD 12/08/19 [phone]

## 2019-12-08 NOTE — ED Notes (Signed)
Pt in with co dizziness and hypotension, states hx of the same. States started acutely last night at 2130 hx of the same and as told it was her renal insufficiency. Pt states bp at home was 60 systolic, no co pain.

## 2019-12-08 NOTE — ED Notes (Signed)
OT at bedside at this time.  °

## 2019-12-08 NOTE — Progress Notes (Signed)
Inpatient Diabetes Program Recommendations  AACE/ADA: New Consensus Statement on Inpatient Glycemic Control (2015)  Target Ranges:  Prepandial:   less than 140 mg/dL      Peak postprandial:   less than 180 mg/dL (1-2 hours)      Critically ill patients:  140 - 180 mg/dL   Lab Results  Component Value Date   GLUCAP 466 (H) 12/08/2019   HGBA1C 11.5 (H) 09/06/2019    Review of Glycemic Control Results for JORGINA, RODEZNO (MRN [id number]) as of 12/08/2019 14:42  Ref. Range 12/08/2019 10:18 12/08/2019 12:34  Glucose-Capillary Latest Ref Range: 70 - 99 mg/dL 240 (H) 466 (H)   Diabetes history: DM 2 Outpatient Diabetes medications:  Humalog 10 units tid with meals Lantus 30 units daily Current orders for Inpatient glycemic control:  Lantus 30 units daily Novolog moderate tid with meals  Solucortef 25 mg IV q 8 hours Inpatient Diabetes Program Recommendations:    Please add Novolog 10 units tid with meals (hold if patient eats less than 50%).   Thanks  Adah Perl, RN, BC-ADM Inpatient Diabetes Coordinator Pager [phone] (8a-5p)

## 2019-12-09 DIAGNOSIS — G6289 Other specified polyneuropathies: Secondary | ICD-10-CM

## 2019-12-09 LAB — CBC
HCT: 34.5 % — ABNORMAL LOW (ref 36.0–46.0)
Hemoglobin: 12 g/dL (ref 12.0–15.0)
MCH: 31.9 pg (ref 26.0–34.0)
MCHC: 34.8 g/dL (ref 30.0–36.0)
MCV: 91.8 fL (ref 80.0–100.0)
Platelets: 182 10*3/uL (ref 150–400)
RBC: 3.76 MIL/uL — ABNORMAL LOW (ref 3.87–5.11)
RDW: 12.2 % (ref 11.5–15.5)
WBC: 6.1 10*3/uL (ref 4.0–10.5)
nRBC: 0 % (ref 0.0–0.2)

## 2019-12-09 LAB — BASIC METABOLIC PANEL
Anion gap: 7 (ref 5–15)
BUN: 13 mg/dL (ref 6–20)
CO2: 25 mmol/L (ref 22–32)
Calcium: 8.4 mg/dL — ABNORMAL LOW (ref 8.9–10.3)
Chloride: 107 mmol/L (ref 98–111)
Creatinine, Ser: 0.33 mg/dL — ABNORMAL LOW (ref 0.44–1.00)
GFR calc Af Amer: >60 mL/min (ref >60)
GFR calc non Af Amer: >60 mL/min (ref >60)
Glucose, Bld: 263 mg/dL — ABNORMAL HIGH (ref 70–99)
Potassium: 3.9 mmol/L (ref 3.5–5.1)
Sodium: 139 mmol/L (ref 135–145)

## 2019-12-09 LAB — GLUCOSE, CAPILLARY
Glucose-Capillary: 229 mg/dL — ABNORMAL HIGH (ref 70–99)
Glucose-Capillary: 179 mg/dL — ABNORMAL HIGH (ref 70–99)
Glucose-Capillary: 163 mg/dL — ABNORMAL HIGH (ref 70–99)
Glucose-Capillary: 232 mg/dL — ABNORMAL HIGH (ref 70–99)

## 2019-12-09 MED ORDER — HYDROCORTISONE NA SUCCINATE PF 100 MG IJ SOLR
25.0000 mg | Freq: Two times a day (BID) | INTRAMUSCULAR | Status: DC
  Administered 2019-12-09: 20:00:00 25 mg via INTRAVENOUS
  Filled 2019-12-09 (×2): qty 0.5

## 2019-12-09 MED ORDER — MECLIZINE HCL 25 MG PO TABS
25.0000 mg | ORAL_TABLET | Freq: Three times a day (TID) | ORAL | Status: DC | PRN
  Filled 2019-12-09: qty 1

## 2019-12-09 NOTE — Progress Notes (Signed)
Patient MEWS scores yellow upon sitting and standing r/t hypotension. Will continue with Q4 vitals. Dr. Jimmye Norman aware.   12/09/19 0851  Assess: MEWS Score  Temp 98.7 F (37.1 C)  BP (!) 82/57  Pulse Rate (!) 115  Resp 14  Level of Consciousness Alert  SpO2 98 %  O2 Device Room Air  Assess: MEWS Score  MEWS Temp 0  MEWS Systolic 1  MEWS Pulse 2  MEWS RR 0  MEWS LOC 0  MEWS Score 3  MEWS Score Color Yellow  Assess: if the MEWS score is Yellow or Red  Were vital signs taken at a resting state? Yes  Focused Assessment Documented focused assessment  Early Detection of Sepsis Score *See Row Information* Low  MEWS guidelines implemented *See Row Information* No, previously yellow, continue vital signs every 4 hours

## 2019-12-09 NOTE — Progress Notes (Addendum)
PROGRESS NOTE    [name] [name]  [id number] DOB: 04/09/1961 DOA: 12/08/2019 PCP: Antony Blackbird, MD       Assessment & Plan:   Active Problems:   Hypotension   Orthostatic hypotension: improved but still positive today. Pt c/o dizziness w/ standing and walking. Like secondary to adrenal insufficiency. Will continue IV solu-cortef, midodrine & florinef. Echo in December 2020 which was normal and showed an EF of 60 to 65%. Recommend pt see an endocrinologist as an outpatient for further evaluation & treatment for possible adrenal insufficiency. Pt verbalized her understanding   Hypokalemia: WNL today. Will continue   DM2: continue on lantus &  Increased SSI to moderate scale w/ accuchecks  Peripheral neuropathy: will continue on duloxetine & gabapentin    DVT prophylaxis: lovenox Code Status: full Family Communication:  Disposition Plan: depends on PT/OT recs    Consultants:    Procedures:   Antimicrobials:   Subjective: Pt c/o dizziness w/ standing and walking still.  Objective: Vitals:   12/08/19 1748 12/08/19 1755 12/09/19 0006 12/09/19 0708  BP: (!) 146/87 (!) 151/84 109/61 93/63  Pulse: (!) 115 (!) 118 (!) 120 (!) 109  Resp: 14  16 15   Temp: 98.1 F (36.7 C)  98.2 F (36.8 C) 98.2 F (36.8 C)  TempSrc: Oral  Oral Oral  SpO2: 98% 94% 96% 96%  Weight: 61.5 kg     Height: 5\' 4"  (1.626 m)       Intake/Output Summary (Last 24 hours) at 12/09/2019 0718 Last data filed at 12/09/2019 0400 Gross per 24 hour  Intake 1000 ml  Output --  Net 1000 ml   Filed Weights   12/08/19 0033 12/08/19 1748  Weight: 62.6 kg 61.5 kg    Examination:  General exam: Appears calm and comfortable  Respiratory system: Clear to auscultation. No wheezes, rales Cardiovascular system: S1 & S2+. No rubs, gallops or clicks. No pedal edema. Gastrointestinal system: Abdomen is nondistended, soft and nontender. Normal bowel sounds heard. Central nervous system: Alert and  oriented. Moves all 4 extremities  Psychiatry: Judgement and insight appear normal. Flat mood and affect    Data Reviewed: I have personally reviewed following labs and imaging studies  CBC: Recent Labs  Lab 12/08/19 0035 12/09/19 0556  WBC 5.4 6.1  HGB 12.3 12.0  HCT 35.0* 34.5*  MCV 90.7 91.8  PLT 182 [id number]   Basic Metabolic Panel: Recent Labs  Lab 12/08/19 0035 12/09/19 0556  NA 137 139  K 2.9* 3.9  CL 103 107  CO2 24 25  GLUCOSE 151* 263*  BUN 12 13  CREATININE 0.42* 0.33*  CALCIUM 8.6* 8.4*  MG 1.9  --    GFR: Estimated Creatinine Clearance: 65.4 mL/min (A) (by C-G formula based on SCr of 0.33 mg/dL (L)). Liver Function Tests: No results for input(s): AST, ALT, ALKPHOS, BILITOT, PROT, ALBUMIN in the last 168 hours. No results for input(s): LIPASE, AMYLASE in the last 168 hours. No results for input(s): AMMONIA in the last 168 hours. Coagulation Profile: No results for input(s): INR, PROTIME in the last 168 hours. Cardiac Enzymes: No results for input(s): CKTOTAL, CKMB, CKMBINDEX, TROPONINI in the last 168 hours. BNP (last 3 results) No results for input(s): PROBNP in the last 8760 hours. HbA1C: No results for input(s): HGBA1C in the last 72 hours. CBG: Recent Labs  Lab 12/08/19 1018 12/08/19 1234 12/08/19 1739 12/08/19 2206  GLUCAP 240* 466* 216* 199*   Lipid Profile: No results for input(s): CHOL, HDL, LDLCALC, TRIG,  CHOLHDL, LDLDIRECT in the last 72 hours. Thyroid Function Tests: No results for input(s): TSH, T4TOTAL, FREET4, T3FREE, THYROIDAB in the last 72 hours. Anemia Panel: No results for input(s): VITAMINB12, FOLATE, FERRITIN, TIBC, IRON, RETICCTPCT in the last 72 hours. Sepsis Labs: No results for input(s): PROCALCITON, LATICACIDVEN in the last 168 hours.  Recent Results (from the past 240 hour(s))  SARS Coronavirus 2 by RT PCR (hospital order, performed in [facility] hospital lab) Nasopharyngeal Nasopharyngeal Swab     Status: None    Collection Time: 12/08/19 10:20 AM   Specimen: Nasopharyngeal Swab  Result Value Ref Range Status   SARS Coronavirus 2 NEGATIVE NEGATIVE Final    Comment: (NOTE) SARS-CoV-2 target nucleic acids are NOT DETECTED. The SARS-CoV-2 RNA is generally detectable in upper and lower respiratory specimens during the acute phase of infection. The lowest concentration of SARS-CoV-2 viral copies this assay can detect is 250 copies / mL. A negative result does not preclude SARS-CoV-2 infection and should not be used as the sole basis for treatment or other patient management decisions.  A negative result may occur with improper specimen collection / handling, submission of specimen other than nasopharyngeal swab, presence of viral mutation(s) within the areas targeted by this assay, and inadequate number of viral copies (<250 copies / mL). A negative result must be combined with clinical observations, patient history, and epidemiological information. Fact Sheet for Patients:   StrictlyIdeas.no Fact Sheet for Healthcare Providers: BankingDealers.co.za This test is not yet approved or cleared  by the [place] FDA and has been authorized for detection and/or diagnosis of SARS-CoV-2 by FDA under an Emergency Use Authorization (EUA).  This EUA will remain in effect (meaning this test can be used) for the duration of the COVID-19 declaration under Section 564(b)(1) of the Act, 21 U.S.C. section 360bbb-3(b)(1), unless the authorization is terminated or revoked sooner. Performed at [facility], [address], [place], [place] [address]          Radiology Studies: No results found.      Scheduled Meds: . DULoxetine  60 mg Oral BID  . enoxaparin (LOVENOX) injection  40 mg Subcutaneous Q24H  . fludrocortisone  0.1 mg Oral Daily  . gabapentin  600 mg Oral BID  . hydrocortisone sod succinate (SOLU-CORTEF) inj  25 mg Intravenous Q8H    . insulin aspart  0-15 Units Subcutaneous TID WC  . insulin aspart  10 Units Subcutaneous TID WC  . insulin glargine  30 Units Subcutaneous Daily  . midodrine  10 mg Oral BID WC  . sodium chloride flush  3 mL Intravenous Once   Continuous Infusions: . sodium chloride 100 mL/hr at 12/08/19 0818     LOS: 0 days    Time spent: 30 mins     Wyvonnia Dusky, MD [facility] Pager [phone]  If 7PM-7AM, please contact night-coverage [email or web address] 12/09/2019, 7:18 AM

## 2019-12-09 NOTE — Evaluation (Signed)
Physical Therapy Evaluation Patient Details Name: [name] [name] MRN: [id number] DOB: 1961/04/05 Today's Date: 12/09/2019   History of Present Illness  Pt is a 59 y/o female who presents with acute onset orthostatic hypotension. Her PMH includes adrenal insufficiency, DM, peripheral neuropathy, GERD, HTN, chronic back pain, vertigo,  Clinical Impression  Patient able to demonstrate modI supine > sit with negative orthostatics with transfer, decreased speed d/t hesitation of becoming dizzy. ModI for STS d/t hesitation with positive orthostatics 85/53. Following stand patient is able to ambulate 38ft modI with BP 108/63 following. Following short rest patietn ambulates around nursing station and over last 37ft requires minA from PT or external support of railings around her, reporting her "legs just get tired", with staggering gait, which she reports happens at home as well. PT recommends HHPT, though patient would greatly benefit from a healthier living situation with more space for safe mobility and caregiver support. Would benefit from skilled PT to address above deficits and promote optimal return to PLOF.     Follow Up Recommendations Home health PT    Equipment Recommendations  Rolling walker with 5" wheels    Recommendations for Other Services       Precautions / Restrictions Precautions Precautions: None Restrictions Other Position/Activity Restrictions: primary concern is orthostatic hypotension- be aware of positional changes      Mobility  Bed Mobility Overal bed mobility: Independent             General bed mobility comments: Encouraged pt to take it slow to prevent orthostatic hypotension with position change  Transfers Overall transfer level: Needs assistance   Transfers: Sit to/from Stand Sit to Stand: Modified independent (Device/Increase time)         General transfer comment: Pt able to complete STS transfer without assisatnce, increased time  needed  Ambulation/Gait Ambulation/Gait assistance: Min assist Gait Distance (Feet): 180 Feet Assistive device: (rails in hallways) Gait Pattern/deviations: Staggering right;Staggering left Gait velocity: decreased   General Gait Details: 72ft out of room and back able ind WNL; lap around the nursing station in last 79ft staggering, but able to maintain balance wtih external support (handrails in hall)  Science writer    Modified Rankin (Stroke Patients Only)       Balance Overall balance assessment: Needs assistance   Sitting balance-Leahy Scale: Good     Standing balance support: During functional activity Standing balance-Leahy Scale: Poor Standing balance comment: Over increased distances needs external support to maintain balance                             Pertinent Vitals/Pain Pain Assessment: No/denies pain Pain Score: 0-No pain Pain Location: Her feet (because of peripheral neuropathy she says) Pain Descriptors / Indicators: Grimacing;Tingling Pain Intervention(s): Monitored during session    Home Living Family/patient expects to be discharged to:: Private residence Living Arrangements: Children;Other relatives Available Help at Discharge: Family;Available 24 hours/day Type of Home: House Home Access: Stairs to enter Entrance Stairs-Rails: None Entrance Stairs-Number of Steps: 3 Home Layout: One level Home Equipment: Shower seat;Bedside commode Additional Comments: Pt began living with her son and his family about a month ago because her daughter's home situation changed. She now lives with 8 other people (son, daughter-in-law, daughter-in law's parents and 3-4 children) in a home that has 1 bathroom. The family has several dogs, cats and other animals that frequently cause pt  to trip or feel unsteady. She is worried that they would tear up an AD.    Prior Function Level of Independence: Independent;Needs assistance    Gait / Transfers Assistance Needed: Pt reports having had more than 2 falls per month. She says that her legs hurt because of the peripheral neuropathy and that she has a "staggering walk." She does not use an AD and uses other people and furniture to get her balance around the house. Sometimes she'll just sit down where she is at if she starts to feel unstable (ie: floor, futon, steps).  ADL's / Homemaking Assistance Needed: Pt reports independence in ADL/IADL management. She sets alarms on her phone for medication reminders and uses a pill box. She denies needing assistance to dress/shower/toilet.  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 She spoke well of her previous job and the residents at the [employer] that she worked with, noting that she understood why they had to let her go. She says she still drives, but noted her son has her car and it needs repairs.     Hand Dominance   Dominant Hand: Right    Extremity/Trunk Assessment   Upper Extremity Assessment Upper Extremity Assessment: Defer to OT evaluation RUE Deficits / Details: ~135 degrees ROM in RUE with pt expressing pain; 3+/5 shoulder/elbow flexion    Lower Extremity Assessment Lower Extremity Assessment: Overall WFL for tasks assessed    Cervical / Trunk Assessment Cervical / Trunk Assessment: Other exceptions Cervical / Trunk Exceptions: raised b/l shoulders, R shoulder higher than L shoulder; slight hunched posture  Communication   Communication: No difficulties  Cognition Arousal/Alertness: Awake/alert Behavior During Therapy: WFL for tasks assessed/performed Overall Cognitive Status: Within Functional Limits for tasks assessed                                 General Comments: No concerns for cognition observed during session. Per pt report, she "can't remember anything anymore" and forgets little things "I know  I should know." She said she woke up twice and forgot to go to work last year, resulting in her losing her job and that she sometimes forgets where she is going while she is driving.      General Comments      Exercises Other Exercises Other Exercises: Pt educated on BP changes from supine > sit > stand wtih orthostatics positive with stand, but return to normal limits for activity following amb 18ft Other Exercises: amb 56ft pt able ind with some decreased speed BP 108/63 following; after rest 132ft around nursing station with BP 111/73 following. In last 39ft of amb around nursing station pt requires minA and external support (railings in hallway) to maintain balance   Assessment/Plan    PT Assessment Patient needs continued PT services  PT Problem List Decreased strength;Decreased mobility;Decreased range of motion;Decreased activity tolerance;Decreased balance;Decreased coordination       PT Treatment Interventions DME instruction;Therapeutic activities;Gait training;Therapeutic exercise;Patient/family education;Stair training;Balance training;Functional mobility training;Neuromuscular re-education;Manual techniques    PT Goals (Current goals can be found in the Care Plan section)  Acute Rehab PT Goals Patient Stated Goal: Wants a safer and healthier living situation and decreased falls PT Goal Formulation: With patient Time For Goal Achievement: 12/23/19 Potential to Achieve Goals: Fair    Frequency Min 2X/week  Barriers to discharge Inaccessible home environment;Decreased caregiver support decreased space for safe mobility    Co-evaluation               AM-PAC PT "6 Clicks" Mobility  Outcome Measure Help needed turning from your back to your side while in a flat bed without using bedrails?: A Little Help needed moving from lying on your back to sitting on the side of a flat bed without using bedrails?: A Little Help needed moving to and from a bed to a chair  (including a wheelchair)?: A Little Help needed standing up from a chair using your arms (e.g., wheelchair or bedside chair)?: A Little Help needed to walk in hospital room?: A Little Help needed climbing 3-5 steps with a railing? : A Lot 6 Click Score: 17    End of Session Equipment Utilized During Treatment: Gait belt Activity Tolerance: Patient tolerated treatment well Patient left: in bed;with call bell/phone within reach;with bed alarm set Nurse Communication: Mobility status PT Visit Diagnosis: Unsteadiness on feet (R26.81);Other abnormalities of gait and mobility (R26.89);Repeated falls (R29.6);History of falling (Z91.81);Muscle weakness (generalized) (M62.81);Difficulty in walking, not elsewhere classified (R26.2)    Time: [id number] PT Time Calculation (min) (ACUTE ONLY): 26 min   Charges:   PT Evaluation $PT Eval Moderate Complexity: 1 Mod PT Treatments $Therapeutic Activity: 23-37 mins        Shelton Silvas PT, DPT  Shelton Silvas 12/09/2019, 11:51 AM

## 2019-12-09 NOTE — Progress Notes (Signed)
Yellow mews protocol not initiated. Yellow mews is baseline from admission.   12/09/19 0006  Assess: MEWS Score  Temp 98.2 F (36.8 C)  BP 109/61  Pulse Rate (!) 120  Resp 16  SpO2 96 %  O2 Device Room Air  Assess: MEWS Score  MEWS Temp 0  MEWS Systolic 0  MEWS Pulse 2  MEWS RR 0  MEWS LOC 0  MEWS Score 2  MEWS Score Color Yellow  Assess: if the MEWS score is Yellow or Red  Were vital signs taken at a resting state? Yes  Focused Assessment Documented focused assessment (no changes from shift assessment)  Early Detection of Sepsis Score *See Row Information* Low  MEWS guidelines implemented *See Row Information* No, other (Comment) (yellow baseline from admission)

## 2019-12-09 NOTE — TOC Progression Note (Signed)
Transition of Care [facility]) - Progression Note    Patient Details  Name: Danelly Hassinger MRN: [id number] Date of Birth: September 29, 1960  [facility] CM/SW Contact  Boris Sharper, LCSW Phone Number: 12/09/2019, 2:59 PM  Clinical Narrative:    CSW was contacted by MD regarding pt's living situation and pt asked to speak to [place]. CSW met with pt bedside, pt disclosed that she is need of a better living situation in her living environment. Pt is currently living with her Son, daughter in law, her daughter in laws parents and grandchildren. Pt states the there is a host of animals in the home, the home is unclean and wanted to know if TOC had any options for her. Pt states that she had to move out of her home due to losing her job and not having income, She then moved in with her daughter for some time and was doing well there and then and to move in with her Son. Pt also notified CSW that she was in the process of getting disability and has a Chief Executive Officer. CSW provided pt with a list of shelters and encouraged her to contact Social Services for Housing resources.        Expected Discharge Plan and Services                                                 Social Determinants of Health (SDOH) Interventions    Readmission Risk Interventions No flowsheet data found.

## 2019-12-10 ENCOUNTER — Other Ambulatory Visit: Payer: Self-pay | Admitting: Family Medicine

## 2019-12-10 DIAGNOSIS — E2740 Unspecified adrenocortical insufficiency: Secondary | ICD-10-CM

## 2019-12-10 DIAGNOSIS — I951 Orthostatic hypotension: Secondary | ICD-10-CM

## 2019-12-10 DIAGNOSIS — E1110 Type 2 diabetes mellitus with ketoacidosis without coma: Secondary | ICD-10-CM

## 2019-12-10 LAB — GLUCOSE, CAPILLARY
Glucose-Capillary: 192 mg/dL — ABNORMAL HIGH (ref 70–99)
Glucose-Capillary: 241 mg/dL — ABNORMAL HIGH (ref 70–99)

## 2019-12-10 LAB — CBC
HCT: 34.2 % — ABNORMAL LOW (ref 36.0–46.0)
Hemoglobin: 11.8 g/dL — ABNORMAL LOW (ref 12.0–15.0)
MCH: 31.9 pg (ref 26.0–34.0)
MCHC: 34.5 g/dL (ref 30.0–36.0)
MCV: 92.4 fL (ref 80.0–100.0)
Platelets: 168 10*3/uL (ref 150–400)
RBC: 3.7 MIL/uL — ABNORMAL LOW (ref 3.87–5.11)
RDW: 12.1 % (ref 11.5–15.5)
WBC: 6 10*3/uL (ref 4.0–10.5)
nRBC: 0 % (ref 0.0–0.2)

## 2019-12-10 LAB — BASIC METABOLIC PANEL
Anion gap: 5 (ref 5–15)
BUN: 11 mg/dL (ref 6–20)
CO2: 26 mmol/L (ref 22–32)
Calcium: 8.5 mg/dL — ABNORMAL LOW (ref 8.9–10.3)
Chloride: 108 mmol/L (ref 98–111)
Creatinine, Ser: 0.43 mg/dL — ABNORMAL LOW (ref 0.44–1.00)
GFR calc Af Amer: >60 mL/min (ref >60)
GFR calc non Af Amer: >60 mL/min (ref >60)
Glucose, Bld: 240 mg/dL — ABNORMAL HIGH (ref 70–99)
Potassium: 3.9 mmol/L (ref 3.5–5.1)
Sodium: 139 mmol/L (ref 135–145)

## 2019-12-10 LAB — MAGNESIUM: Magnesium: 1.8 mg/dL (ref 1.7–2.4)

## 2019-12-10 MED ORDER — HYDROCORTISONE NA SUCCINATE PF 100 MG IJ SOLR
25.0000 mg | Freq: Every day | INTRAMUSCULAR | Status: DC
  Administered 2019-12-10: 25 mg via INTRAVENOUS
  Filled 2019-12-10: qty 0.5

## 2019-12-10 NOTE — Progress Notes (Signed)
Patient ID: [name] [name], female   DOB: 11/25/1960, 59 y.o.   MRN: [id number]   Patient with recurrent issues with orthostatic hypotension and status post recent ED visit with recommendation for endocrinology referral for further follow-up of adrenal insufficiency.  Referral to endocrinology placed

## 2019-12-10 NOTE — Discharge Summary (Signed)
Physician Discharge Summary  [name] [name] [id number] DOB: 1960-09-26 DOA: 12/08/2019  PCP: [name] Blackbird, MD  Admit date: 12/08/2019 Discharge date: 12/10/2019  Admitted From: home Disposition:  home  Recommendations for Outpatient Follow-up:  1. Follow up with PCP in 1 week to get a referral to see endocrinology to further       Evaluate for adrenal insufficiency  Home Health: no Equipment/Devices:  Discharge Condition: stable CODE STATUS: full  Diet recommendation: regular  Brief/Interim Summary: HPI was taken from [name] [name]:  [name] [name]  is a 59 y.o. Caucasian female with a known history of adrenal insufficiency, type diabetes mellitus, peripheral neuropathy, GERD, hypertension and chronic back pain, who presented to the emergency room with acute onset of hypotension with systolic blood pressure of 69 by EMS.  She was orthostatic for them.  She admitted to mild headache and dizziness as well as a syncope prior to their arrival.  No fever or chills or nausea or vomiting.  No diarrhea or melena or bright red bleeding per rectum.  No bleeding diathesis.  Upon presentation to the emergency room, blood pressure was 138/75 with a heart rate of 111.  The patient's blood pressure in the supine position was 127/82 and dropped to 111/76 in sitting position and was down to 102/55 in standing position.  Heart rate was 110, 112 then 114.  Labs revealed hypokalemia of 2.9 with a sodium of 137 chloride 103.  Blood glucose was 151.  BUN was 12 with creatinine of 0.42.  CBC was unremarkable.  The patient was given 2 L bolus of IV normal saline, 100 mg of IV Solu-Cortef and 40 mEq of p.o. potassium chloride.  He will be admitted to an observation medical monitor bed for further evaluation and management.  Hospital Course from Dr. Lenise [name] 5/21-5/23/21: Pt was found to have orthostatic hypotension that was treated w/ IVFs, solu-cortef, midodrine & florinef. The orthostatic hypotension had improved w/  above stated treatment. Pt was recommended to get a referral from her PCP to an endocrinologist to further evaluation for possible adrenal insufficiency. Pt verbalized her understanding.   Discharge Diagnoses:  Active Problems:   Hypotension  Orthostatic hypotension: much improved but still positive. Like secondary to adrenal insufficiency. Will continue IV solu-cortef taper while inpatient only & continue on midodrine & florinef. Echo in December 2020 which was normal and showed an EF of 60 to 65%. Recommend pt see an endocrinologist as an outpatient for further evaluation & treatment for possible adrenal insufficiency. Pt verbalized her understanding   Hypokalemia: WNL today. Will continue   DM2: continue on lantus & SSI w/ accuchecks. Carb modified diet   Peripheral neuropathy: will continue on duloxetine & gabapentin  Discharge Instructions  Discharge Instructions    Diet - low sodium heart healthy   Complete by: As directed    Diet Carb Modified   Complete by: As directed    Discharge instructions   Complete by: As directed    F/U PCP in 1 week to get a referral to endocrinology for further evaluation for adrenal insufficiency   Increase activity slowly   Complete by: As directed      Allergies as of 12/10/2019      Reactions   Tramadol Nausea And Vomiting   REACTION: Projectile vomiting      Medication List    TAKE these medications   acetaminophen 500 MG tablet Commonly known as: TYLENOL Take 2 tablets (1,000 mg total) by mouth every 6 (six) hours  as needed for mild pain or headache (pain).   DULoxetine 60 MG capsule Commonly known as: CYMBALTA Take 1 capsule (60 mg total) by mouth 2 (two) times daily.   fludrocortisone 0.1 MG tablet Commonly known as: FLORINEF TAKE 1 TABLET (0.1 MG TOTAL) BY MOUTH DAILY.   gabapentin 600 MG tablet Commonly known as: NEURONTIN TAKE ONE TABLET (600 MG) BY MOUTH EVERY MORNING AND AFTERNOON, TAKE TWO TABLETS AT NIGHT    glucose blood test strip Relion Testing Strips Use as instructed   HumaLOG KwikPen 100 UNIT/ML KwikPen Generic drug: insulin lispro Inject 0.1 mLs (10 Units total) into the skin 3 (three) times daily. What changed:   when to take this  additional instructions   Lantus SoloStar 100 UNIT/ML Solostar Pen Generic drug: insulin glargine Inject 30 Units into the skin daily.   midodrine 10 MG tablet Commonly known as: PROAMATINE Take 1 tablet (10 mg total) by mouth 2 (two) times daily with a meal.   Pen Needles 31G X 8 MM Misc 1 Units by Does not apply route daily.   Pen Needles 31G X 8 MM Misc Use as directed       Allergies  Allergen Reactions  . Tramadol Nausea And Vomiting    REACTION: Projectile vomiting    Consultations:     Procedures/Studies: No results found.    Subjective: Pt c/o feeling cold.    Discharge Exam: Vitals:   12/10/19 0525 12/10/19 0730  BP: 113/68 121/77  Pulse: 92 96  Resp: 16 16  Temp: 98.2 F (36.8 C) 98.1 F (36.7 C)  SpO2: 98% 99%   Vitals:   12/09/19 1128 12/09/19 1652 12/10/19 0525 12/10/19 0730  BP: 120/80 121/74 113/68 121/77  Pulse: (!) 104 93 92 96  Resp:  18 16 16   Temp:  97.7 F (36.5 C) 98.2 F (36.8 C) 98.1 F (36.7 C)  TempSrc:  Oral Oral Oral  SpO2:  98% 98% 99%  Weight:      Height:        General: Pt is alert, awake, not in acute distress Cardiovascular: S1/S2 +, no rubs, no gallops Respiratory: CTA bilaterally, no wheezing, no rhonchi Abdominal: Soft, NT, ND, bowel sounds + Extremities: no edema, no cyanosis    The results of significant diagnostics from this hospitalization (including imaging, microbiology, ancillary and laboratory) are listed below for reference.     Microbiology: Recent Results (from the past 240 hour(s))  SARS Coronavirus 2 by RT PCR (hospital order, performed in [facility] hospital lab) Nasopharyngeal Nasopharyngeal Swab     Status: None   Collection Time: 12/08/19  10:20 AM   Specimen: Nasopharyngeal Swab  Result Value Ref Range Status   SARS Coronavirus 2 NEGATIVE NEGATIVE Final    Comment: (NOTE) SARS-CoV-2 target nucleic acids are NOT DETECTED. The SARS-CoV-2 RNA is generally detectable in upper and lower respiratory specimens during the acute phase of infection. The lowest concentration of SARS-CoV-2 viral copies this assay can detect is 250 copies / mL. A negative result does not preclude SARS-CoV-2 infection and should not be used as the sole basis for treatment or other patient management decisions.  A negative result may occur with improper specimen collection / handling, submission of specimen other than nasopharyngeal swab, presence of viral mutation(s) within the areas targeted by this assay, and inadequate number of viral copies (<250 copies / mL). A negative result must be combined with clinical observations, patient history, and epidemiological information. Fact Sheet for Patients:  StrictlyIdeas.no Fact Sheet for Healthcare Providers: BankingDealers.co.za This test is not yet approved or cleared  by the [place] FDA and has been authorized for detection and/or diagnosis of SARS-CoV-2 by FDA under an Emergency Use Authorization (EUA).  This EUA will remain in effect (meaning this test can be used) for the duration of the COVID-19 declaration under Section 564(b)(1) of the Act, 21 U.S.C. section 360bbb-3(b)(1), unless the authorization is terminated or revoked sooner. Performed at [facility], [address] [place], [place] [address]      Labs: BNP (last 3 results) No results for input(s): BNP in the last 8760 hours. Basic Metabolic Panel: Recent Labs  Lab 12/08/19 0035 12/09/19 0556 12/10/19 0655  NA 137 139 139  K 2.9* 3.9 3.9  CL 103 107 108  CO2 24 25 26   GLUCOSE 151* 263* 240*  BUN 12 13 11   CREATININE 0.42* 0.33* 0.43*  CALCIUM 8.6* 8.4* 8.5*   MG 1.9  --  1.8   Liver Function Tests: No results for input(s): AST, ALT, ALKPHOS, BILITOT, PROT, ALBUMIN in the last 168 hours. No results for input(s): LIPASE, AMYLASE in the last 168 hours. No results for input(s): AMMONIA in the last 168 hours. CBC: Recent Labs  Lab 12/08/19 0035 12/09/19 0556 12/10/19 0655  WBC 5.4 6.1 6.0  HGB 12.3 12.0 11.8*  HCT 35.0* 34.5* 34.2*  MCV 90.7 91.8 92.4  PLT 182 182 168   Cardiac Enzymes: No results for input(s): CKTOTAL, CKMB, CKMBINDEX, TROPONINI in the last 168 hours. BNP: Invalid input(s): POCBNP CBG: Recent Labs  Lab 12/09/19 1149 12/09/19 1652 12/09/19 2157 12/10/19 0823 12/10/19 1153  GLUCAP 179* 163* 232* 192* 241*   D-Dimer No results for input(s): DDIMER in the last 72 hours. Hgb A1c No results for input(s): HGBA1C in the last 72 hours. Lipid Profile No results for input(s): CHOL, HDL, LDLCALC, TRIG, CHOLHDL, LDLDIRECT in the last 72 hours. Thyroid function studies No results for input(s): TSH, T4TOTAL, T3FREE, THYROIDAB in the last 72 hours.  Invalid input(s): FREET3 Anemia work up No results for input(s): VITAMINB12, FOLATE, FERRITIN, TIBC, IRON, RETICCTPCT in the last 72 hours. Urinalysis    Component Value Date/Time   COLORURINE STRAW (A) 12/08/2019 1125   APPEARANCEUR CLEAR (A) 12/08/2019 1125   APPEARANCEUR Clear 03/15/2019 1056   LABSPEC 1.016 12/08/2019 1125   PHURINE 6.0 12/08/2019 1125   GLUCOSEU >=500 (A) 12/08/2019 1125   HGBUR NEGATIVE 12/08/2019 1125   BILIRUBINUR NEGATIVE 12/08/2019 1125   BILIRUBINUR negative 08/09/2019 1720   BILIRUBINUR Negative 03/15/2019 1056   KETONESUR 5 (A) 12/08/2019 1125   PROTEINUR NEGATIVE 12/08/2019 1125   UROBILINOGEN 0.2 08/09/2019 1720   UROBILINOGEN 0.2 11/12/2013 1755   NITRITE NEGATIVE 12/08/2019 1125   LEUKOCYTESUR NEGATIVE 12/08/2019 1125   Sepsis Labs Invalid input(s): PROCALCITONIN,  WBC,  LACTICIDVEN Microbiology Recent Results (from the past  240 hour(s))  SARS Coronavirus 2 by RT PCR (hospital order, performed in [facility] hospital lab) Nasopharyngeal Nasopharyngeal Swab     Status: None   Collection Time: 12/08/19 10:20 AM   Specimen: Nasopharyngeal Swab  Result Value Ref Range Status   SARS Coronavirus 2 NEGATIVE NEGATIVE Final    Comment: (NOTE) SARS-CoV-2 target nucleic acids are NOT DETECTED. The SARS-CoV-2 RNA is generally detectable in upper and lower respiratory specimens during the acute phase of infection. The lowest concentration of SARS-CoV-2 viral copies this assay can detect is 250 copies / mL. A negative result does not preclude SARS-CoV-2  infection and should not be used as the sole basis for treatment or other patient management decisions.  A negative result may occur with improper specimen collection / handling, submission of specimen other than nasopharyngeal swab, presence of viral mutation(s) within the areas targeted by this assay, and inadequate number of viral copies (<250 copies / mL). A negative result must be combined with clinical observations, patient history, and epidemiological information. Fact Sheet for Patients:   StrictlyIdeas.no Fact Sheet for Healthcare Providers: BankingDealers.co.za This test is not yet approved or cleared  by the [place] FDA and has been authorized for detection and/or diagnosis of SARS-CoV-2 by FDA under an Emergency Use Authorization (EUA).  This EUA will remain in effect (meaning this test can be used) for the duration of the COVID-19 declaration under Section 564(b)(1) of the Act, 21 U.S.C. section 360bbb-3(b)(1), unless the authorization is terminated or revoked sooner. Performed at [facility], [address], [place], [place] [address]      Time coordinating discharge: Over 30 minutes  SIGNED:   Wyvonnia Dusky, MD  [facility] 12/10/2019, 12:57 PM Pager   If 7PM-7AM,  please contact night-coverage [email or web address]

## 2019-12-10 NOTE — Progress Notes (Signed)
Patient is adequate for discharge. AVS reviewed. No questions at this time.

## 2019-12-11 ENCOUNTER — Telehealth: Payer: Self-pay

## 2019-12-11 NOTE — Telephone Encounter (Signed)
  [facility]  Follow-up Telephone Call  Date of discharge and from where: 12/10/2019 from [facility]   How have you been since you were released from the hospital? Pt stated feeling much better  Any questions or concerns?  None verbalized  Items Reviewed:  Did the pt receive and understand the discharge instructions provided?  Yes  Medications obtained and verified?  Yes /verified with the patient    Any new allergies since your discharge? None stated  Dietary orders reviewed? Reviewed with patient   Do you have support at home?  Yes, son and family  Functional Questionnaire: (I = Independent and D = Dependent) ADLs: I   Follow up appointments reviewed:   PCP Hospital f/u appt confirmed? Scheduled to see Dr Chapman Fitch on 12/21/2019 @ 3:30am  [facility] f/u appt confirmed?  None     Are transportation arrangements needed? No / son will drive if needed  If their condition worsens, is the pt aware to call PCP or go to the Emergency Dept.? Yes/made pt aware Pt is aware if condition is worsening or start experiencing any of diff breathing, SOB, chest pain, extreme fatigue,  Persistent nausea and vomiting, bleeding , rapid weight gain, severe uncontrolled pain, or visual disturbances to return to ED  Was the patient provided with contact information for the PCP's office or ED? YES  Was to pt encouraged to call back with questions or concerns?  PT/ WAS ENCOURAGED TO CALL OFFICE WITH ANY QUESTIONS OR CONCERNS/ VERBALIZED UNDERSTANDING   OTHER               Pt stated last DM check and BP were done yesterday at the Hospital. Last BS check was in the morning 135 reading and BP 1230/63.  Stressed the importance to have regular BP and DM reading throughout the day and to keep a record and bring it to PCP appt. Stressed the importance to take meds as instructed and follow a modified carb diet. Verbalized  understanding

## 2019-12-21 ENCOUNTER — Ambulatory Visit: Payer: Medicaid Other | Admitting: Family Medicine

## 2019-12-26 ENCOUNTER — Telehealth: Payer: Self-pay | Admitting: Cardiology

## 2020-01-04 MED FILL — GABAPENTIN 600 MG TABLET: 600 | 30 days supply | Qty: 120 | Fill #1

## 2020-01-04 MED FILL — DULoxetine HCL 60 MG CPEP: 60 | 30 days supply | Qty: 30 | Fill #3

## 2020-01-16 ENCOUNTER — Ambulatory Visit: Payer: Self-pay | Admitting: Internal Medicine

## 2020-01-16 ENCOUNTER — Other Ambulatory Visit: Payer: Self-pay

## 2020-01-16 ENCOUNTER — Ambulatory Visit: Payer: Self-pay | Attending: Family | Admitting: Family

## 2020-01-16 DIAGNOSIS — Z79899 Other long term (current) drug therapy: Secondary | ICD-10-CM | POA: Insufficient documentation

## 2020-01-16 DIAGNOSIS — G2581 Restless legs syndrome: Secondary | ICD-10-CM | POA: Insufficient documentation

## 2020-01-16 DIAGNOSIS — E1142 Type 2 diabetes mellitus with diabetic polyneuropathy: Secondary | ICD-10-CM | POA: Insufficient documentation

## 2020-01-16 DIAGNOSIS — G894 Chronic pain syndrome: Secondary | ICD-10-CM | POA: Insufficient documentation

## 2020-01-16 DIAGNOSIS — Z885 Allergy status to narcotic agent status: Secondary | ICD-10-CM | POA: Insufficient documentation

## 2020-01-16 DIAGNOSIS — M25562 Pain in left knee: Secondary | ICD-10-CM | POA: Insufficient documentation

## 2020-01-16 DIAGNOSIS — H409 Unspecified glaucoma: Secondary | ICD-10-CM | POA: Insufficient documentation

## 2020-01-16 DIAGNOSIS — M25511 Pain in right shoulder: Secondary | ICD-10-CM | POA: Insufficient documentation

## 2020-01-16 DIAGNOSIS — F329 Major depressive disorder, single episode, unspecified: Secondary | ICD-10-CM | POA: Insufficient documentation

## 2020-01-16 DIAGNOSIS — Z794 Long term (current) use of insulin: Secondary | ICD-10-CM | POA: Insufficient documentation

## 2020-01-16 DIAGNOSIS — G8929 Other chronic pain: Secondary | ICD-10-CM

## 2020-01-16 DIAGNOSIS — K219 Gastro-esophageal reflux disease without esophagitis: Secondary | ICD-10-CM | POA: Insufficient documentation

## 2020-01-16 DIAGNOSIS — M25512 Pain in left shoulder: Secondary | ICD-10-CM | POA: Insufficient documentation

## 2020-01-16 DIAGNOSIS — I10 Essential (primary) hypertension: Secondary | ICD-10-CM | POA: Insufficient documentation

## 2020-01-16 DIAGNOSIS — M25561 Pain in right knee: Secondary | ICD-10-CM | POA: Insufficient documentation

## 2020-01-16 DIAGNOSIS — E2740 Unspecified adrenocortical insufficiency: Secondary | ICD-10-CM | POA: Insufficient documentation

## 2020-01-16 MED ORDER — DICLOFENAC SODIUM 1 % EX GEL: 1.0000 "application " | Freq: Four times a day (QID) | CUTANEOUS | 0 refills | Status: DC

## 2020-01-16 NOTE — Patient Instructions (Signed)
Diclofenac gel for shoulder and knee pain. Referral to Orthopedics for shoulder and knee pain. Follow-up with primary physician as needed. Shoulder Pain Many things can cause shoulder pain, including:  An injury.  Moving the shoulder in the same way again and again (overuse).  Joint pain (arthritis). Pain can come from:  Swelling and irritation (inflammation) of any part of the shoulder.  An injury to the shoulder joint.  An injury to: ? Tissues that connect muscle to bone (tendons). ? Tissues that connect bones to each other (ligaments). ? Bones. Follow these instructions at home: Watch for changes in your symptoms. Let your doctor know about them. Follow these instructions to help with your pain. If you have a sling:  Wear the sling as told by your doctor. Remove it only as told by your doctor.  Loosen the sling if your fingers: ? Tingle. ? Become numb. ? Turn cold and blue.  Keep the sling clean.  If the sling is not waterproof: ? Do not let it get wet. ? Take the sling off when you shower or bathe. Managing pain, stiffness, and swelling   If told, put ice on the painful area: ? Put ice in a plastic bag. ? Place a towel between your skin and the bag. ? Leave the ice on for 20 minutes, 2-3 times a day. Stop putting ice on if it does not help with the pain.  Squeeze a soft ball or a foam pad as much as possible. This prevents swelling in the shoulder. It also helps to strengthen the arm. General instructions  Take over-the-counter and prescription medicines only as told by your doctor.  Keep all follow-up visits as told by your doctor. This is important. Contact a doctor if:  Your pain gets worse.  Medicine does not help your pain.  You have new pain in your arm, hand, or fingers. Get help right away if:  Your arm, hand, or fingers: ? Tingle. ? Are numb. ? Are swollen. ? Are painful. ? Turn white or blue. Summary  Shoulder pain can be caused by many  things. These include injury, moving the shoulder in the same away again and again, and joint pain.  Watch for changes in your symptoms. Let your doctor know about them.  This condition may be treated with a sling, ice, and pain medicine.  Contact your doctor if the pain gets worse or you have new pain. Get help right away if your arm, hand, or fingers tingle or get numb, swollen, or painful.  Keep all follow-up visits as told by your doctor. This is important. This information is not intended to replace advice given to you by your health care provider. Make sure you discuss any questions you have with your health care provider. Document Revised: 01/18/2018 Document Reviewed: 01/18/2018 Elsevier Patient Education  [place].

## 2020-01-16 NOTE — Progress Notes (Signed)
Virtual Visit via Telephone Note  I connected with [name] [name], on 01/16/2020 at 2:23 PM by telephone due to the COVID-19 pandemic and verified that I am speaking with the correct person using two identifiers.  Due to current restrictions/limitations of in-office visits due to the COVID-19 pandemic, this scheduled clinical appointment was converted to a telehealth visit.   Consent: I discussed the limitations, risks, security and privacy concerns of performing an evaluation and management service by telephone and the availability of in person appointments. I also discussed with the patient that there may be a patient responsible charge related to this service. The patient expressed understanding and agreed to proceed.  Location of Patient: Home  Location of Provider: [facility]  Persons participating in Telemedicine visit: Klyn Kroening Durene Fruits, NP Doloris Hall, CMA  History of Present Illness: [name] [name] is a 59 year old female with history of hypotension, syncope and collapse, acute sinusitis,diabetes type 2 uncontrolled, diabetic polyneuropathy associated with type 2 diabetes mellitus, adrenal insufficiency, depression, chronic pain syndrome, restless leg syndrome, sinus tachycardia, hypokalemia, and pulmonary nodules who presents for shoulder and knee pain.  1. SHOULDER PAIN:  Location: bilateral  Duration: months Aggravating Factors: arm movement, laying on arm Relieving Factors: being really still, pulling arm straight Quality: 10/10 pain, comes and goes Radiation: right arm can barely raise  Numbness/Tingling: bilateral hand numbness ADLs: needs help Previous Injury/Surgery: denies  Chest Pain: denies  Shortness of Breath: denies   2. KNEE PAIN:  Location: bilateral  Onset: 1 year  Duration: constant pain but intensity comes and goes Pain rating: 10/10 Description: sharp pain, right knee is worse Radiation: ankles Swelling:  denies Popping/clicking sounds: popping knees but this is baseline Color: denies Numbness/tingling: tingling to feet and toes throbbing Aggravating factors: denies Relieving factors: none Medications or therapies attempted: Icy Hot patches, Tylenol, Ibuprofen, Gabapentin, Cymbalta, Lidocaine cream and none help Trauma/falls: [place] a couple of days ago after feeling knees going weak. [place] [address] and landed on left side but there were pillows there to protect and cushion fall. Occupation: denies Exercise: denies  Assistive devices such as walker or cane: denies  Requesting to see Orthopedics. Reports she was a patient with Dr. Vira Blanco for pain management and was told that she could not be referred to Orthopedics at that time. Today reports she is no longer seeing pain management because she no longer has health insurance.   Past Medical History:  Diagnosis Date  . Cervical disc disorder with radiculopathy of cervical region   . Chronic low back pain    HNP  . Chronic pain syndrome   . Degenerative joint disease   . Depression dx 1997  . Diabetes mellitus, type 2 ([place]) 2011   No insulin  . Diabetic polyneuropathy ([place])   . GERD (gastroesophageal reflux disease)   . Glaucoma   . Headache(784.0)   . Herniated disc   . Hypertension    Lab 11/2011:  CXR, EKG, CBC, TSH, BMet, troponin-normal; lipid profile: 188, 131, 36, 126   . Lumbar herniated disc   . Non-compliance   . Pancreatic cyst    Endoscopic aspiration in 09/2009  . Pneumonia 08/02/2012  . Shingles   . 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 Torn meniscus   . Vertigo    Allergies  Allergen Reactions  . Tramadol Nausea And Vomiting    REACTION: Projectile vomiting    Current Outpatient Medications on File Prior to Visit  Medication Sig Dispense Refill  . acetaminophen (TYLENOL) 500 MG tablet Take 2 tablets (1,000 mg total) by mouth every 6 (six) hours as needed for mild pain or headache (pain).  0  . DULoxetine  (CYMBALTA) 60 MG capsule Take 1 capsule (60 mg total) by mouth 2 (two) times daily. 60 capsule 4  . fludrocortisone (FLORINEF) 0.1 MG tablet TAKE 1 TABLET (0.1 MG TOTAL) BY MOUTH DAILY. 30 tablet 2  . gabapentin (NEURONTIN) 600 MG tablet TAKE ONE TABLET (600 MG) BY MOUTH EVERY MORNING AND AFTERNOON, TAKE TWO TABLETS AT NIGHT 120 tablet 1  . glucose blood test strip Relion Testing Strips Use as instructed 100 each 12  . HUMALOG KWIKPEN 100 UNIT/ML KwikPen Inject 0.1 mLs (10 Units total) into the skin 3 (three) times daily. (Patient taking differently: Inject 10 Units into the skin daily at 12 noon. Per sliding scale) 15 mL prn  . Insulin Glargine (LANTUS SOLOSTAR) 100 UNIT/ML Solostar Pen Inject 30 Units into the skin daily. 15 mL prn  . Insulin Pen Needle (PEN NEEDLES) 31G X 8 MM MISC 1 Units by Does not apply route daily. 100 each 2  . Insulin Pen Needle (PEN NEEDLES) 31G X 8 MM MISC Use as directed 100 each 6  . midodrine (PROAMATINE) 10 MG tablet Take 1 tablet (10 mg total) by mouth 2 (two) times daily with a meal. 60 tablet 0   No current facility-administered medications on file prior to visit.    Observations/Objective: Alert and oriented x 3. Not in acute distress. Physical examination not completed as this is a telemedicine visit.  Assessment and Plan: 1. Chronic pain of both shoulders: -Patient with bilateral shoulder pain, bilateral hand numbness, and radiation to right arm. -Will try course of Diclofenac gel for this.  -Patient request to be referred to Orthopedics.  -Patient originally referred to Orthopedics on 09/20/2019 by her primary physician. Patient reports she never received a call for this. - diclofenac Sodium (VOLTAREN) 1 % GEL; Apply 1 application topically 4 (four) times daily.  Dispense: 100 g; Refill: 0 - Ambulatory referral to Orthopedic Surgery  2. Chronic pain of both knees: -Patient with bilateral knee pain with tingling feet, throbbing toes, and radiation to  bilateral ankles. -Will try course of Diclofenac gel for this.  -Patient request to be referred to Orthopedics.  -Patient originally referred to Orthopedics on 09/20/2019 by her primary physician. Patient reports she never received a call for this. - diclofenac Sodium (VOLTAREN) 1 % GEL; Apply 1 application topically 4 (four) times daily.  Dispense: 100 g; Refill: 0 - Ambulatory referral to Orthopedic Surgery  Follow Up Instructions: Follow-up with primary physician as needed.   Patient was given clear instructions to go to Emergency Department or return to medical center if symptoms don't improve, worsen, or new problems develop.The patient verbalized understanding.  I discussed the assessment and treatment plan with the patient. The patient was provided an opportunity to ask questions and all were answered. The patient agreed with the plan and demonstrated an understanding of the instructions.   The patient was advised to call back or seek an in-person evaluation if the symptoms worsen or if the condition fails to improve as anticipated.   I provided 17 minutes total of non-face-to-face time during this encounter including median intraservice time, reviewing previous notes, labs, imaging, medications, management and patient verbalized understanding.   Camillia Herter, NP  [facility] [place], [place]   01/16/2020,  2:18 PM

## 2020-01-17 ENCOUNTER — Telehealth: Payer: Self-pay | Admitting: Family Medicine

## 2020-01-17 MED FILL — DICLOFENAC SODIUM 1% GEL: 1 | 25 days supply | Qty: 100 | Fill #0

## 2020-01-17 NOTE — Telephone Encounter (Signed)
I will send you a copy of the CAFA letter by mail today

## 2020-01-24 ENCOUNTER — Other Ambulatory Visit: Payer: Self-pay

## 2020-01-24 ENCOUNTER — Ambulatory Visit (INDEPENDENT_AMBULATORY_CARE_PROVIDER_SITE_OTHER): Payer: Medicaid Other | Admitting: Family Medicine

## 2020-01-24 ENCOUNTER — Encounter: Payer: Self-pay | Admitting: Family Medicine

## 2020-01-24 DIAGNOSIS — M25562 Pain in left knee: Secondary | ICD-10-CM

## 2020-01-24 DIAGNOSIS — G8929 Other chronic pain: Secondary | ICD-10-CM

## 2020-01-24 DIAGNOSIS — M25561 Pain in right knee: Secondary | ICD-10-CM

## 2020-01-24 DIAGNOSIS — M25572 Pain in left ankle and joints of left foot: Secondary | ICD-10-CM

## 2020-01-24 DIAGNOSIS — M25571 Pain in right ankle and joints of right foot: Secondary | ICD-10-CM

## 2020-01-24 DIAGNOSIS — M25511 Pain in right shoulder: Secondary | ICD-10-CM

## 2020-01-24 DIAGNOSIS — M25512 Pain in left shoulder: Secondary | ICD-10-CM

## 2020-01-24 MED ORDER — GLUCOSAMINE SULFATE 1000 MG PO CAPS
1.0000 | ORAL_CAPSULE | Freq: Two times a day (BID) | ORAL | 1 refills | Status: DC
Start: 2020-01-24 — End: 2020-08-12

## 2020-01-24 MED ORDER — MELOXICAM 15 MG PO TABS: 7.5000 mg | ORAL_TABLET | Freq: Every day | ORAL | 6 refills | Status: DC | PRN

## 2020-01-24 MED ORDER — TURMERIC 500 MG PO CAPS
500.0000 mg | ORAL_CAPSULE | Freq: Two times a day (BID) | ORAL | 3 refills | Status: DC
Start: 2020-01-24 — End: 2020-06-02

## 2020-01-24 MED ORDER — VITAMIN D-3 125 MCG (5000 UT) PO TABS
1.0000 | ORAL_TABLET | Freq: Every day | ORAL | 3 refills | Status: DC
Start: 2020-01-24 — End: 2021-06-17

## 2020-01-24 MED FILL — MELOXICAM 15 MG TABLET: 15 | 30 days supply | Qty: 30 | Fill #0

## 2020-01-24 NOTE — Progress Notes (Signed)
Office Visit Note   Patient: [name] [name]           Date of Birth: [birth year]           MRN: [id number] Visit Date: 01/24/2020 Requested by: Camillia Herter, NP [address] [place],  [place] [address] PCP: Antony Blackbird, MD  Subjective: Chief Complaint  Patient presents with  . Right Knee - Pain    Knees give way. Chronic pain.  . Left Knee - Pain    Some time back, she had an MRI on the left knee - torn meniscus.  . Right Shoulder - Pain    Decreased ROM, wakes her from sleep.  . Left Shoulder - Pain    Had MRI in 2019 - report in chart  . pain bilateral ankles and great toes    HPI: She is here with multiple areas of pain.  She states that starting at around age 52, she started having aches and pains throughout her body.  She was diagnosed eventually with neuropathy and is on gabapentin for that.  She has had troubles with her spine and has had epidural injections for that.  In the past couple years her shoulders have been hurting, she had an MRI scan of the left shoulder in 2019 showing rotator cuff tendinopathy but no tears.  She has had troubles with both of her knees, both of her ankles and her great toes.  She has x-rays in the chart which I reviewed, and there mostly unremarkable.  No significant degenerative change.  She is no longer working, is currently pursuing disability.               ROS:   All other systems were reviewed and are negative.  Objective: Vital Signs: There were no vitals taken for this visit.  Physical Exam:  General:  Alert and oriented, in no acute distress. Pulm:  Breathing unlabored. Psy:  Normal mood, congruent affect. Skin: No visible rash Shoulders: She has full range of motion on the left, slightly decreased overhead reach on the right.  Isometric rotator cuff strength is 5/5 bilaterally.  Right shoulder is tender in the posterior subacromial space. Knees: No effusion in either knee today.  Both knees are tender on the medial joint line.   Full extension, flexion of 130 degrees.  No laxity with varus or valgus stress. Ankles: No effusion on either side.  Right ankle is tender laterally and the left ankle is tender medially.  Full range of motion with no ligamentous laxity. Toes: Both great toes are tender at the first MTP.    Imaging: No results found.  Assessment & Plan: 1.  Chronic multiple joint pain involving bilateral shoulders, knees, ankles and great toes. -We will treat conservatively for now with referral to physical therapy, meloxicam as needed, trial of vitamin D3, glucosamine, and turmeric. -If pain persists we might order some labs to further evaluate. -Could contemplate cortisone injection in the future as well.     Procedures: No procedures performed  No notes on file     PMFS History: Patient Active Problem List   Diagnosis Date Noted  . Adrenal insufficiency ([place]) 09/07/2019  . Recurrent syncope 09/06/2019  . Sinus tachycardia 09/06/2019  . Hypokalemia 09/06/2019  . Pulmonary nodules 09/06/2019  . Orthostasis   . Syncope and collapse 06/30/2019  . Hypotension 06/29/2019  . Chronic pain syndrome 08/24/2017  . Diabetic polyneuropathy associated with type 2 diabetes mellitus ([place]) 08/24/2017  . Restless leg  syndrome 08/24/2017  . Vertigo 04/09/2016  . Acute sinusitis 06/27/2015  . Cervical disc disorder with radiculopathy of cervical region 02/20/2015  . Neck muscle spasm 10/23/2014  . Healthcare maintenance 10/23/2014  . Depression 11/25/2013  . Diabetes type 2, uncontrolled ([place])    Past Medical History:  Diagnosis Date  . Cervical disc disorder with radiculopathy of cervical region   . Chronic low back pain    HNP  . Chronic pain syndrome   . Degenerative joint disease   . Depression dx 1997  . Diabetes mellitus, type 2 ([place]) 2011   No insulin  . Diabetic polyneuropathy ([place])   . GERD (gastroesophageal reflux disease)   . Glaucoma   . Headache(784.0)   . Herniated disc   .  Hypertension    Lab 11/2011:  CXR, EKG, CBC, TSH, BMet, troponin-normal; lipid profile: 188, 131, 36, 126   . Lumbar herniated disc   . Non-compliance   . Pancreatic cyst    Endoscopic aspiration in 09/2009  . Pneumonia 08/02/2012  . Shingles   . 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 Torn meniscus   . Vertigo     Family History  Problem Relation Age of Onset  . Depression Mother        type 1  . Diabetes Mother   . Renal Disease Mother   . Lung cancer Father   . Heart attack Brother        Died age 36 - was told due to massive MI/heart "exploded"  . Heart failure Maternal Grandmother        Details not totally clear    Past Surgical History:  Procedure Laterality Date  . ABDOMINAL HYSTERECTOMY    . CESAREAN SECTION     Social History   Occupational History  . Not on file  Tobacco Use  . Smoking status: Former Smoker    Quit date: 08/03/1978    Years since quitting: 41.5  . Smokeless tobacco: Never Used  . Tobacco comment: Smoked age [age]  Substance and Sexual Activity  . Alcohol use: No    Comment: Former  . Drug use: No  . Sexual activity: Yes    Birth control/protection: Surgical

## 2020-01-25 ENCOUNTER — Other Ambulatory Visit: Payer: Self-pay | Admitting: Family Medicine

## 2020-01-25 DIAGNOSIS — E1142 Type 2 diabetes mellitus with diabetic polyneuropathy: Secondary | ICD-10-CM

## 2020-01-25 DIAGNOSIS — E1165 Type 2 diabetes mellitus with hyperglycemia: Secondary | ICD-10-CM

## 2020-02-01 ENCOUNTER — Other Ambulatory Visit: Payer: Self-pay | Admitting: Family Medicine

## 2020-02-01 DIAGNOSIS — E1142 Type 2 diabetes mellitus with diabetic polyneuropathy: Secondary | ICD-10-CM

## 2020-02-01 DIAGNOSIS — E1165 Type 2 diabetes mellitus with hyperglycemia: Secondary | ICD-10-CM

## 2020-02-01 MED FILL — DULoxetine HCL 60 MG CPEP: 60 | 30 days supply | Qty: 60 | Fill #0

## 2020-02-06 ENCOUNTER — Other Ambulatory Visit: Payer: Self-pay | Admitting: Family Medicine

## 2020-02-06 DIAGNOSIS — E1142 Type 2 diabetes mellitus with diabetic polyneuropathy: Secondary | ICD-10-CM

## 2020-02-06 DIAGNOSIS — E1165 Type 2 diabetes mellitus with hyperglycemia: Secondary | ICD-10-CM

## 2020-02-06 MED FILL — GABAPENTIN 600 MG TABLET: 600 | 30 days supply | Qty: 120 | Fill #0

## 2020-02-20 ENCOUNTER — Ambulatory Visit: Payer: Self-pay | Admitting: Physical Therapy

## 2020-02-24 ENCOUNTER — Other Ambulatory Visit: Payer: Self-pay

## 2020-02-24 ENCOUNTER — Emergency Department: Payer: Self-pay

## 2020-02-24 ENCOUNTER — Inpatient Hospital Stay
Admission: EM | Admit: 2020-02-24 | Discharge: 2020-02-27 | DRG: 494 | Disposition: A | Discharge status: Home Health | Payer: Self-pay | Attending: Internal Medicine | Admitting: Internal Medicine

## 2020-02-24 DIAGNOSIS — Z87891 Personal history of nicotine dependence: Secondary | ICD-10-CM

## 2020-02-24 DIAGNOSIS — Z885 Allergy status to narcotic agent status: Secondary | ICD-10-CM

## 2020-02-24 DIAGNOSIS — Z8249 Family history of ischemic heart disease and other diseases of the circulatory system: Secondary | ICD-10-CM

## 2020-02-24 DIAGNOSIS — R739 Hyperglycemia, unspecified: Secondary | ICD-10-CM

## 2020-02-24 DIAGNOSIS — M810 Age-related osteoporosis without current pathological fracture: Secondary | ICD-10-CM | POA: Diagnosis present

## 2020-02-24 DIAGNOSIS — Z01818 Encounter for other preprocedural examination: Secondary | ICD-10-CM

## 2020-02-24 DIAGNOSIS — H409 Unspecified glaucoma: Secondary | ICD-10-CM | POA: Diagnosis present

## 2020-02-24 DIAGNOSIS — W1839XA Other fall on same level, initial encounter: Secondary | ICD-10-CM | POA: Diagnosis present

## 2020-02-24 DIAGNOSIS — G894 Chronic pain syndrome: Secondary | ICD-10-CM | POA: Diagnosis present

## 2020-02-24 DIAGNOSIS — S82851A Displaced trimalleolar fracture of right lower leg, initial encounter for closed fracture: Principal | ICD-10-CM | POA: Diagnosis present

## 2020-02-24 DIAGNOSIS — Z794 Long term (current) use of insulin: Secondary | ICD-10-CM

## 2020-02-24 DIAGNOSIS — S8251XA Displaced fracture of medial malleolus of right tibia, initial encounter for closed fracture: Secondary | ICD-10-CM

## 2020-02-24 DIAGNOSIS — Y9301 Activity, walking, marching and hiking: Secondary | ICD-10-CM | POA: Diagnosis present

## 2020-02-24 DIAGNOSIS — G909 Disorder of the autonomic nervous system, unspecified: Secondary | ICD-10-CM

## 2020-02-24 DIAGNOSIS — M5126 Other intervertebral disc displacement, lumbar region: Secondary | ICD-10-CM | POA: Diagnosis present

## 2020-02-24 DIAGNOSIS — K219 Gastro-esophageal reflux disease without esophagitis: Secondary | ICD-10-CM | POA: Diagnosis present

## 2020-02-24 DIAGNOSIS — I951 Orthostatic hypotension: Secondary | ICD-10-CM

## 2020-02-24 DIAGNOSIS — E1165 Type 2 diabetes mellitus with hyperglycemia: Secondary | ICD-10-CM

## 2020-02-24 DIAGNOSIS — Z90710 Acquired absence of both cervix and uterus: Secondary | ICD-10-CM

## 2020-02-24 DIAGNOSIS — Z833 Family history of diabetes mellitus: Secondary | ICD-10-CM

## 2020-02-24 DIAGNOSIS — E860 Dehydration: Secondary | ICD-10-CM

## 2020-02-24 DIAGNOSIS — Z20822 Contact with and (suspected) exposure to covid-19: Secondary | ICD-10-CM | POA: Diagnosis present

## 2020-02-24 DIAGNOSIS — E1143 Type 2 diabetes mellitus with diabetic autonomic (poly)neuropathy: Secondary | ICD-10-CM | POA: Diagnosis present

## 2020-02-24 DIAGNOSIS — E1142 Type 2 diabetes mellitus with diabetic polyneuropathy: Secondary | ICD-10-CM | POA: Diagnosis present

## 2020-02-24 DIAGNOSIS — I10 Essential (primary) hypertension: Secondary | ICD-10-CM | POA: Diagnosis present

## 2020-02-24 DIAGNOSIS — Z79899 Other long term (current) drug therapy: Secondary | ICD-10-CM

## 2020-02-24 DIAGNOSIS — W19XXXA Unspecified fall, initial encounter: Secondary | ICD-10-CM

## 2020-02-24 DIAGNOSIS — R55 Syncope and collapse: Secondary | ICD-10-CM | POA: Diagnosis present

## 2020-02-24 DIAGNOSIS — Z7952 Long term (current) use of systemic steroids: Secondary | ICD-10-CM

## 2020-02-24 DIAGNOSIS — Y92009 Unspecified place in unspecified non-institutional (private) residence as the place of occurrence of the external cause: Secondary | ICD-10-CM

## 2020-02-24 LAB — COMPREHENSIVE METABOLIC PANEL
ALT: 32 U/L (ref 0–44)
AST: 32 U/L (ref 15–41)
Albumin: 3.4 g/dL — ABNORMAL LOW (ref 3.5–5.0)
Alkaline Phosphatase: 123 U/L (ref 38–126)
Anion gap: 12 (ref 5–15)
BUN: 9 mg/dL (ref 6–20)
CO2: 20 mmol/L — ABNORMAL LOW (ref 22–32)
Calcium: 8.2 mg/dL — ABNORMAL LOW (ref 8.9–10.3)
Chloride: 96 mmol/L — ABNORMAL LOW (ref 98–111)
Creatinine, Ser: 0.7 mg/dL (ref 0.44–1.00)
GFR calc Af Amer: >60 mL/min (ref >60)
GFR calc non Af Amer: >60 mL/min (ref >60)
Glucose, Bld: 646 mg/dL (ref 70–99)
Potassium: 4.4 mmol/L (ref 3.5–5.1)
Sodium: 128 mmol/L — ABNORMAL LOW (ref 135–145)
Total Bilirubin: 0.8 mg/dL (ref 0.3–1.2)
Total Protein: 6.4 g/dL — ABNORMAL LOW (ref 6.5–8.1)

## 2020-02-24 LAB — BLOOD GAS, VENOUS
Acid-base deficit: 1.9 mmol/L (ref 0.0–2.0)
Bicarbonate: 22.2 mmol/L (ref 20.0–28.0)
O2 Saturation: 89.5 %
Patient temperature: 37
pCO2, Ven: 35 mmHg — ABNORMAL LOW (ref 44.0–60.0)
pH, Ven: 7.41 (ref 7.250–7.430)
pO2, Ven: 57 mmHg — ABNORMAL HIGH (ref 32.0–45.0)

## 2020-02-24 LAB — CBC
HCT: 36.8 % (ref 36.0–46.0)
Hemoglobin: 12.6 g/dL (ref 12.0–15.0)
MCH: 31.3 pg (ref 26.0–34.0)
MCHC: 34.2 g/dL (ref 30.0–36.0)
MCV: 91.5 fL (ref 80.0–100.0)
Platelets: 184 10*3/uL (ref 150–400)
RBC: 4.02 MIL/uL (ref 3.87–5.11)
RDW: 11.7 % (ref 11.5–15.5)
WBC: 6.4 10*3/uL (ref 4.0–10.5)
nRBC: 0 % (ref 0.0–0.2)

## 2020-02-24 LAB — GLUCOSE, CAPILLARY
Glucose-Capillary: >600 mg/dL (ref 70–99)
Glucose-Capillary: 306 mg/dL — ABNORMAL HIGH (ref 70–99)

## 2020-02-24 MED ORDER — MORPHINE SULFATE (PF) 4 MG/ML IV SOLN
4.0000 mg | Freq: Once | INTRAVENOUS | Status: AC
  Administered 2020-02-24: 4 mg via INTRAVENOUS
  Filled 2020-02-24: qty 1

## 2020-02-24 MED ORDER — SODIUM CHLORIDE 0.9 % IV BOLUS
1000.0000 mL | Freq: Once | INTRAVENOUS | Status: AC
  Administered 2020-02-24: 1000 mL via INTRAVENOUS

## 2020-02-24 MED ORDER — SODIUM CHLORIDE 0.9 % IV SOLN: Freq: Once | INTRAVENOUS | Status: AC

## 2020-02-24 MED ORDER — FENTANYL CITRATE (PF) 100 MCG/2ML IJ SOLN
50.0000 ug | Freq: Once | INTRAMUSCULAR | Status: AC
  Administered 2020-02-24: 50 ug via INTRAVENOUS
  Filled 2020-02-24: qty 2

## 2020-02-24 NOTE — ED Notes (Signed)
Report given to Ashley, RN

## 2020-02-24 NOTE — ED Notes (Addendum)
Attempted to get the patient up to practice using crutches, pt was able to sit on the side of the bed, but then stated she felt like her BP dropped and she was dizzy.  BP checked at this time was 79/49 HR 124. Pt positioned back in bed at this time and Dr. Archie Balboa notified. Crutches left at bedside.

## 2020-02-24 NOTE — ED Provider Notes (Signed)
[facility] Emergency Department Provider Note   ____________________________________________   I have reviewed the triage vital signs and the nursing notes.   HISTORY  Chief Complaint Right ankle pain  History limited by: Not Limited   HPI Katherleen Folkes is a 59 y.o. female who presents to the emergency department today with chief complaint of right ankle pain after a fall. The patient states she was walking when her knees went out from under her. She says that this happens with some frequency. This time however the patient fell backwards and hurt her right leg. She is complaining of pain to the right ankle that radiates up to her right knee. The patient says she also hit the back of her head but denies LOC. She denies any chest pain or palpitations. Says her sugars have been running high, in the 300s over the past few days. No recent illness or fevers.    Records reviewed. Per medical record review patient has a history of DM.  Past Medical History:  Diagnosis Date  . Cervical disc disorder with radiculopathy of cervical region   . Chronic low back pain    HNP  . Chronic pain syndrome   . Degenerative joint disease   . Depression dx 1997  . Diabetes mellitus, type 2 ([place]) 2011   No insulin  . Diabetic polyneuropathy ([place])   . GERD (gastroesophageal reflux disease)   . Glaucoma   . Headache(784.0)   . Herniated disc   . Hypertension    Lab 11/2011:  CXR, EKG, CBC, TSH, BMet, troponin-normal; lipid profile: 188, 131, 36, 126   . Lumbar herniated disc   . Non-compliance   . Pancreatic cyst    Endoscopic aspiration in 09/2009  . Pneumonia 08/02/2012  . Shingles   . 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 Torn meniscus   . Vertigo     Patient Active Problem List   Diagnosis Date Noted  . Adrenal insufficiency ([place]) 09/07/2019  . Recurrent syncope 09/06/2019  . Sinus tachycardia 09/06/2019  . Hypokalemia 09/06/2019  . Pulmonary nodules  09/06/2019  . Orthostasis   . Syncope and collapse 06/30/2019  . Hypotension 06/29/2019  . Chronic pain syndrome 08/24/2017  . Diabetic polyneuropathy associated with type 2 diabetes mellitus ([place]) 08/24/2017  . Restless leg syndrome 08/24/2017  . Vertigo 04/09/2016  . Acute sinusitis 06/27/2015  . Cervical disc disorder with radiculopathy of cervical region 02/20/2015  . Neck muscle spasm 10/23/2014  . Healthcare maintenance 10/23/2014  . Depression 11/25/2013  . Diabetes type 2, uncontrolled ([place])     Past Surgical History:  Procedure Laterality Date  . ABDOMINAL HYSTERECTOMY    . CESAREAN SECTION      Prior to Admission medications   Medication Sig Start Date End Date Taking? Authorizing Provider  acetaminophen (TYLENOL) 500 MG tablet Take 2 tablets (1,000 mg total) by mouth every 6 (six) hours as needed for mild pain or headache (pain). 07/02/19   Domenic Polite, MD  Cholecalciferol (VITAMIN D-3) 125 MCG (5000 UT) TABS Take 1 tablet by mouth daily. 01/24/20   Hilts, Legrand Como, MD  diclofenac Sodium (VOLTAREN) 1 % GEL Apply 1 application topically 4 (four) times daily. Patient not taking: Reported on 01/24/2020 01/16/20   Camillia Herter, NP  DULoxetine (CYMBALTA) 60 MG capsule TAKE 1 CAPSULE (60 MG TOTAL) BY MOUTH 2 (TWO) TIMES DAILY. 02/01/20   Fulp, Cammie, MD  fludrocortisone (FLORINEF) 0.1 MG tablet TAKE 1  TABLET (0.1 MG TOTAL) BY MOUTH DAILY. 10/04/19   Lorretta Harp, MD  gabapentin (NEURONTIN) 600 MG tablet TAKE ONE TABLET (600 MG) BY MOUTH EVERY MORNING AND AFTERNOON, TAKE TWO TABLETS AT NIGHT 02/06/20   Fulp, Cammie, MD  Glucosamine Sulfate 1000 MG CAPS Take 1 capsule (1,000 mg total) by mouth 2 (two) times daily. 01/24/20   Hilts, Legrand Como, MD  glucose blood test strip [employer] Testing Strips Use as instructed 08/18/17   Lockamy, Timothy, DO  HUMALOG KWIKPEN 100 UNIT/ML KwikPen Inject 0.1 mLs (10 Units total) into the skin 3 (three) times daily. Patient taking differently: Inject  10 Units into the skin daily at 12 noon. Per sliding scale 03/15/19   Fulp, Cammie, MD  Insulin Glargine (LANTUS SOLOSTAR) 100 UNIT/ML Solostar Pen Inject 30 Units into the skin daily. 09/07/19   Black, Lezlie Octave, NP  Insulin Pen Needle (PEN NEEDLES) 31G X 8 MM MISC 1 Units by Does not apply route daily. 08/20/17   Nuala Alpha, DO  Insulin Pen Needle (PEN NEEDLES) 31G X 8 MM MISC Use as directed 09/07/19   Eugenie Filler, MD  meloxicam (MOBIC) 15 MG tablet Take 0.5-1 tablets (7.5-15 mg total) by mouth daily as needed for pain. 01/24/20   Hilts, Legrand Como, MD  midodrine (PROAMATINE) 10 MG tablet Take 1 tablet (10 mg total) by mouth 2 (two) times daily with a meal. 09/07/19   Black, Lezlie Octave, NP  Turmeric 500 MG CAPS Take 500 mg by mouth 2 (two) times daily. 01/24/20   Hilts, Michael, MD    Allergies Tramadol  Family History  Problem Relation Age of Onset  . Depression Mother        type 1  . Diabetes Mother   . Renal Disease Mother   . Lung cancer Father   . Heart attack Brother        Died age 77 - was told due to massive MI/heart "exploded"  . Heart failure Maternal Grandmother        Details not totally clear    Social History Social History   Tobacco Use  . Smoking status: Former Smoker    Quit date: 08/03/1978    Years since quitting: 41.5  . Smokeless tobacco: Never Used  . Tobacco comment: Smoked age [age]  Substance Use Topics  . Alcohol use: No    Comment: Former  . Drug use: No    Review of Systems Constitutional: No fever/chills Eyes: No visual changes. ENT: No sore throat. Cardiovascular: Denies chest pain. Respiratory: Denies shortness of breath. Gastrointestinal: No abdominal pain.  No nausea, no vomiting.  No diarrhea.   Genitourinary: Negative for dysuria. Musculoskeletal: Positive for right ankle pain and swelling. Skin: Negative for rash. Neurological: Negative for headaches, focal weakness or numbness.  ____________________________________________    PHYSICAL EXAM:  VITAL SIGNS: ED Triage Vitals  Enc Vitals Group     BP 02/24/20 1734 109/73     Pulse Rate 02/24/20 1734 (!) 118     Resp 02/24/20 1734 (!) 24     Temp 02/24/20 1739 98.7 F (37.1 C)     Temp Source 02/24/20 1739 Oral     SpO2 02/24/20 1734 98 %     Weight 02/24/20 1734 140 lb (63.5 kg)     Height 02/24/20 1734 5\' 6"  (1.676 m)     Head Circumference --      Peak Flow --      Pain Score 02/24/20 1734 10   Constitutional:  Alert and oriented.  Eyes: Conjunctivae are normal.  ENT      Head: Normocephalic and atraumatic.      Nose: No congestion/rhinnorhea.      Mouth/Throat: Mucous membranes are moist.      Neck: No stridor. Hematological/Lymphatic/Immunilogical: No cervical lymphadenopathy. Cardiovascular: Normal rate, regular rhythm.  No murmurs, rubs, or gallops.  Respiratory: Normal respiratory effort without tachypnea nor retractions. Breath sounds are clear and equal bilaterally. No wheezes/rales/rhonchi. Gastrointestinal: Soft and non tender. No rebound. No guarding.  Genitourinary: Deferred Musculoskeletal: right ankle swollen, tender to palpation and manipulation. Proximal fibula tender to palpation. DP 2+ Neurologic:  Normal speech and language. No gross focal neurologic deficits are appreciated.  Skin:  Skin is warm, dry and intact. No rash noted. Psychiatric: Mood and affect are normal. Speech and behavior are normal. Patient exhibits appropriate insight and judgment.  ____________________________________________    LABS (pertinent positives/negatives)  CBC wbc 6.4, hgb 12.6, plt 184  ____________________________________________   EKG  I, Nance Pear, attending physician, personally viewed and interpreted this EKG  EKG Time: 1735 Rate: 116 Rhythm: sinus tachycardia Axis: right axis deviation Intervals: qtc 459 QRS: narrow ST changes: no st elevation Impression: abnormal ekg  ____________________________________________     RADIOLOGY  Right ankle/tib fib Acute fracture of medial and posterior malleolus and distal fibula   ____________________________________________   PROCEDURES  Procedures  ____________________________________________   INITIAL IMPRESSION / ASSESSMENT AND PLAN / ED COURSE  Pertinent labs & imaging results that were available during my care of the patient were reviewed by me and considered in my medical decision making (see chart for details).   Patient presented to the emergency department today because of concerns for right ankle pain after a fall.  The patient states that her legs went out from under her.  On exam patient does have some swelling to that right ankle however neurovascularly intact.  X-rays do show ankle and distal fibula fracture.  She was placed in a splint.  Blood work however was concerning for hyperglycemia.  At this time no evidence of DKA.  Blood sugars did improve after IV fluids however upon trying to stand and ambulate patient she became quite dizzy her blood pressure dropped and her heart rate accelerated.  I do have concerns for continued dehydration.  Will plan on admission to the hospital service.  Discussed with Dr. Harlow Mares with orthopedics who is aware. ____________________________________________   FINAL CLINICAL IMPRESSION(S) / ED DIAGNOSES  Final diagnoses:  Fall, initial encounter  Hyperglycemia  Dehydration     Note: This dictation was prepared with Dragon dictation. Any transcriptional errors that result from this process are unintentional     Nance Pear, MD 02/24/20 2324

## 2020-02-24 NOTE — ED Notes (Signed)
X-ray at bedside

## 2020-02-24 NOTE — ED Notes (Signed)
Dr. Goodman at bedside.  

## 2020-02-24 NOTE — ED Triage Notes (Signed)
Pt arrives via EMS from home after having a fall- pt states she was walking when her "knees gave out" and she fell- pt had pain and deformity to R ankle- per EMS cbg was >600- pt has had 284ml of fluids- pt has a hx of diabetes

## 2020-02-25 ENCOUNTER — Observation Stay: Payer: Self-pay | Admitting: Anesthesiology

## 2020-02-25 ENCOUNTER — Encounter
Admission: EM | Disposition: A | Discharge status: Home Health | Payer: Self-pay | Source: Home / Self Care | Attending: Internal Medicine

## 2020-02-25 ENCOUNTER — Observation Stay: Payer: Self-pay

## 2020-02-25 DIAGNOSIS — S8254XA Nondisplaced fracture of medial malleolus of right tibia, initial encounter for closed fracture: Secondary | ICD-10-CM

## 2020-02-25 DIAGNOSIS — Z01818 Encounter for other preprocedural examination: Secondary | ICD-10-CM

## 2020-02-25 HISTORY — PX: ORIF ANKLE FRACTURE: SHX5408

## 2020-02-25 LAB — SARS CORONAVIRUS 2 BY RT PCR (HOSPITAL ORDER, PERFORMED IN ~~LOC~~ HOSPITAL LAB): SARS Coronavirus 2: NEGATIVE

## 2020-02-25 LAB — GLUCOSE, CAPILLARY
Glucose-Capillary: 291 mg/dL — ABNORMAL HIGH (ref 70–99)
Glucose-Capillary: 271 mg/dL — ABNORMAL HIGH (ref 70–99)
Glucose-Capillary: 201 mg/dL — ABNORMAL HIGH (ref 70–99)
Glucose-Capillary: 203 mg/dL — ABNORMAL HIGH (ref 70–99)
Glucose-Capillary: 186 mg/dL — ABNORMAL HIGH (ref 70–99)

## 2020-02-25 LAB — HEMOGLOBIN A1C
Hgb A1c MFr Bld: 14.8 % — ABNORMAL HIGH (ref 4.8–5.6)
Mean Plasma Glucose: 378.06 mg/dL

## 2020-02-25 SURGERY — OPEN REDUCTION INTERNAL FIXATION (ORIF) ANKLE FRACTURE
Anesthesia: General | Site: Ankle | Laterality: Right

## 2020-02-25 MED ORDER — OXYCODONE HCL 5 MG/5ML PO SOLN: 5.0000 mg | Freq: Once | ORAL | Status: DC | PRN

## 2020-02-25 MED ORDER — HYDROMORPHONE HCL 1 MG/ML IJ SOLN: 0.5000 mg | INTRAMUSCULAR | Status: DC | PRN

## 2020-02-25 MED ORDER — SUGAMMADEX SODIUM 200 MG/2ML IV SOLN
INTRAVENOUS | Status: DC | PRN
  Administered 2020-02-25: 200 mg via INTRAVENOUS

## 2020-02-25 MED ORDER — FENTANYL CITRATE (PF) 100 MCG/2ML IJ SOLN
INTRAMUSCULAR | Status: AC
  Filled 2020-02-25: qty 2

## 2020-02-25 MED ORDER — SODIUM CHLORIDE 0.9 % IV SOLN
INTRAVENOUS | Status: DC | PRN
  Administered 2020-02-25: 15 ug/min via INTRAVENOUS

## 2020-02-25 MED ORDER — DULOXETINE HCL 60 MG PO CPEP
60.0000 mg | ORAL_CAPSULE | Freq: Two times a day (BID) | ORAL | Status: DC
  Administered 2020-02-25 – 2020-02-27 (×4): 60 mg via ORAL
  Filled 2020-02-25 (×5): qty 1

## 2020-02-25 MED ORDER — KETOROLAC TROMETHAMINE 15 MG/ML IJ SOLN
15.0000 mg | Freq: Four times a day (QID) | INTRAMUSCULAR | Status: AC
  Administered 2020-02-25 – 2020-02-26 (×4): 15 mg via INTRAVENOUS
  Filled 2020-02-25 (×4): qty 1

## 2020-02-25 MED ORDER — INSULIN ASPART 100 UNIT/ML ~~LOC~~ SOLN
0.0000 [IU] | SUBCUTANEOUS | Status: DC
  Administered 2020-02-25: 3 [IU] via SUBCUTANEOUS
  Administered 2020-02-25: 2 [IU] via SUBCUTANEOUS
  Administered 2020-02-25 – 2020-02-26 (×2): 5 [IU] via SUBCUTANEOUS
  Administered 2020-02-26 (×2): 3 [IU] via SUBCUTANEOUS
  Administered 2020-02-26: 2 [IU] via SUBCUTANEOUS
  Administered 2020-02-26: 3 [IU] via SUBCUTANEOUS
  Administered 2020-02-26: 7 [IU] via SUBCUTANEOUS
  Administered 2020-02-27 (×3): 3 [IU] via SUBCUTANEOUS
  Administered 2020-02-27: 2 [IU] via SUBCUTANEOUS
  Administered 2020-02-27: 3 [IU] via SUBCUTANEOUS
  Filled 2020-02-25 (×14): qty 1

## 2020-02-25 MED ORDER — ONDANSETRON HCL 4 MG PO TABS: 4.0000 mg | ORAL_TABLET | Freq: Four times a day (QID) | ORAL | Status: DC | PRN

## 2020-02-25 MED ORDER — ROCURONIUM BROMIDE 100 MG/10ML IV SOLN
INTRAVENOUS | Status: DC | PRN
  Administered 2020-02-25: 40 mg via INTRAVENOUS

## 2020-02-25 MED ORDER — PHENYLEPHRINE HCL (PRESSORS) 10 MG/ML IV SOLN
INTRAVENOUS | Status: AC
  Filled 2020-02-25: qty 1

## 2020-02-25 MED ORDER — CEFAZOLIN SODIUM-DEXTROSE 2-3 GM-%(50ML) IV SOLR
INTRAVENOUS | Status: DC | PRN
Start: 2020-02-25 — End: 2020-02-25
  Administered 2020-02-25: 2 g via INTRAVENOUS

## 2020-02-25 MED ORDER — SODIUM CHLORIDE 0.9 % IV SOLN: INTRAVENOUS | Status: DC

## 2020-02-25 MED ORDER — LACTATED RINGERS IV SOLN: INTRAVENOUS | Status: DC

## 2020-02-25 MED ORDER — ONDANSETRON HCL 4 MG/2ML IJ SOLN: 4.0000 mg | Freq: Once | INTRAMUSCULAR | Status: DC | PRN

## 2020-02-25 MED ORDER — VITAMIN D3 25 MCG (1000 UNIT) PO TABS
5000.0000 [IU] | ORAL_TABLET | Freq: Every day | ORAL | Status: DC
  Administered 2020-02-26 – 2020-02-27 (×2): 5000 [IU] via ORAL
  Filled 2020-02-25 (×6): qty 5

## 2020-02-25 MED ORDER — BUPIVACAINE HCL (PF) 0.5 % IJ SOLN
INTRAMUSCULAR | Status: DC | PRN
  Administered 2020-02-25: 10 mL

## 2020-02-25 MED ORDER — OXYCODONE HCL 5 MG PO TABS
10.0000 mg | ORAL_TABLET | ORAL | Status: DC | PRN
  Administered 2020-02-26 (×2): 10 mg via ORAL
  Filled 2020-02-25 (×2): qty 2

## 2020-02-25 MED ORDER — HYDROCODONE-ACETAMINOPHEN 5-325 MG PO TABS
1.0000 | ORAL_TABLET | ORAL | Status: DC | PRN
  Administered 2020-02-25 – 2020-02-26 (×4): 1 via ORAL
  Filled 2020-02-25 (×4): qty 1

## 2020-02-25 MED ORDER — FLUDROCORTISONE ACETATE 0.1 MG PO TABS
0.1000 mg | ORAL_TABLET | Freq: Every day | ORAL | Status: DC
  Administered 2020-02-26 – 2020-02-27 (×2): 0.1 mg via ORAL
  Filled 2020-02-25 (×3): qty 1

## 2020-02-25 MED ORDER — FENTANYL CITRATE (PF) 100 MCG/2ML IJ SOLN
25.0000 ug | INTRAMUSCULAR | Status: DC | PRN
  Administered 2020-02-25 (×2): 50 ug via INTRAVENOUS

## 2020-02-25 MED ORDER — KETAMINE HCL 50 MG/ML IJ SOLN
INTRAMUSCULAR | Status: DC | PRN
Start: 2020-02-25 — End: 2020-02-25
  Administered 2020-02-25: 2 mg via INTRAMUSCULAR
  Administered 2020-02-25 (×2): 10 mg via INTRAMUSCULAR

## 2020-02-25 MED ORDER — MIDAZOLAM HCL 2 MG/2ML IJ SOLN
INTRAMUSCULAR | Status: AC
  Filled 2020-02-25: qty 2

## 2020-02-25 MED ORDER — ACETAMINOPHEN 325 MG PO TABS: 650.0000 mg | ORAL_TABLET | Freq: Four times a day (QID) | ORAL | Status: DC | PRN

## 2020-02-25 MED ORDER — FENTANYL CITRATE (PF) 100 MCG/2ML IJ SOLN
INTRAMUSCULAR | Status: DC | PRN
  Administered 2020-02-25: 50 ug via INTRAVENOUS

## 2020-02-25 MED ORDER — ACETAMINOPHEN 10 MG/ML IV SOLN
INTRAVENOUS | Status: AC
  Filled 2020-02-25: qty 100

## 2020-02-25 MED ORDER — ONDANSETRON HCL 4 MG/2ML IJ SOLN
4.0000 mg | Freq: Four times a day (QID) | INTRAMUSCULAR | Status: DC | PRN
  Administered 2020-02-25 (×2): 4 mg via INTRAVENOUS

## 2020-02-25 MED ORDER — KETAMINE HCL 50 MG/ML IJ SOLN
INTRAMUSCULAR | Status: AC
  Filled 2020-02-25: qty 10

## 2020-02-25 MED ORDER — EPHEDRINE 5 MG/ML INJ
INTRAVENOUS | Status: AC
  Filled 2020-02-25: qty 20

## 2020-02-25 MED ORDER — ACETAMINOPHEN 10 MG/ML IV SOLN
INTRAVENOUS | Status: DC | PRN
Start: 2020-02-25 — End: 2020-02-25
  Administered 2020-02-25: 1000 mg via INTRAVENOUS

## 2020-02-25 MED ORDER — INSULIN GLARGINE 100 UNIT/ML ~~LOC~~ SOLN
30.0000 [IU] | Freq: Every day | SUBCUTANEOUS | Status: DC
  Administered 2020-02-25: 30 [IU] via SUBCUTANEOUS
  Filled 2020-02-25 (×3): qty 0.3

## 2020-02-25 MED ORDER — CEFAZOLIN SODIUM-DEXTROSE 2-4 GM/100ML-% IV SOLN
2.0000 g | INTRAVENOUS | Status: DC
  Filled 2020-02-25: qty 100

## 2020-02-25 MED ORDER — ESMOLOL HCL 100 MG/10ML IV SOLN
INTRAVENOUS | Status: DC | PRN
  Administered 2020-02-25: 200 mg via INTRAVENOUS

## 2020-02-25 MED ORDER — PROPOFOL 10 MG/ML IV BOLUS
INTRAVENOUS | Status: DC | PRN
  Administered 2020-02-25: 80 mg via INTRAVENOUS

## 2020-02-25 MED ORDER — DEXAMETHASONE SODIUM PHOSPHATE 10 MG/ML IJ SOLN
INTRAMUSCULAR | Status: AC
  Filled 2020-02-25: qty 2

## 2020-02-25 MED ORDER — OXYCODONE HCL 5 MG PO TABS: 5.0000 mg | ORAL_TABLET | ORAL | Status: DC | PRN

## 2020-02-25 MED ORDER — MIDAZOLAM HCL 2 MG/2ML IJ SOLN
INTRAMUSCULAR | Status: DC | PRN
  Administered 2020-02-25: 2 mg via INTRAVENOUS

## 2020-02-25 MED ORDER — EPHEDRINE SULFATE 50 MG/ML IJ SOLN
INTRAMUSCULAR | Status: DC | PRN
  Administered 2020-02-25: 5 mg via INTRAVENOUS

## 2020-02-25 MED ORDER — LIDOCAINE HCL (CARDIAC) PF 100 MG/5ML IV SOSY
PREFILLED_SYRINGE | INTRAVENOUS | Status: DC | PRN
  Administered 2020-02-25: 60 mg via INTRAVENOUS

## 2020-02-25 MED ORDER — ONDANSETRON HCL 4 MG/2ML IJ SOLN
INTRAMUSCULAR | Status: AC
  Filled 2020-02-25: qty 8

## 2020-02-25 MED ORDER — GABAPENTIN 300 MG PO CAPS
600.0000 mg | ORAL_CAPSULE | Freq: Three times a day (TID) | ORAL | Status: DC
  Administered 2020-02-25 – 2020-02-27 (×6): 600 mg via ORAL
  Filled 2020-02-25 (×4): qty 2
  Filled 2020-02-25: qty 6
  Filled 2020-02-25: qty 2

## 2020-02-25 MED ORDER — OXYCODONE HCL 5 MG PO TABS: 5.0000 mg | ORAL_TABLET | Freq: Once | ORAL | Status: DC | PRN

## 2020-02-25 MED ORDER — LIDOCAINE HCL (PF) 2 % IJ SOLN
INTRAMUSCULAR | Status: AC
  Filled 2020-02-25: qty 15

## 2020-02-25 MED ORDER — SODIUM CHLORIDE 0.9 % IV SOLN
INTRAVENOUS | Status: DC | PRN
  Administered 2020-02-25: 100 mL

## 2020-02-25 MED ORDER — PHENYLEPHRINE HCL (PRESSORS) 10 MG/ML IV SOLN
INTRAVENOUS | Status: DC | PRN
  Administered 2020-02-25 (×4): 100 ug via INTRAVENOUS

## 2020-02-25 MED ORDER — BACITRACIN 50000 UNITS IM SOLR
INTRAMUSCULAR | Status: AC
  Filled 2020-02-25: qty 1

## 2020-02-25 MED ORDER — ACETAMINOPHEN 650 MG RE SUPP: 650.0000 mg | Freq: Four times a day (QID) | RECTAL | Status: DC | PRN

## 2020-02-25 MED ORDER — GLYCOPYRROLATE 0.2 MG/ML IJ SOLN
INTRAMUSCULAR | Status: DC | PRN
  Administered 2020-02-25: .2 mg via INTRAVENOUS

## 2020-02-25 MED ORDER — DEXMEDETOMIDINE HCL IN NACL 80 MCG/20ML IV SOLN
INTRAVENOUS | Status: AC
  Filled 2020-02-25: qty 40

## 2020-02-25 MED ORDER — ESMOLOL HCL 100 MG/10ML IV SOLN
INTRAVENOUS | Status: AC
  Filled 2020-02-25: qty 10

## 2020-02-25 MED ORDER — ACETAMINOPHEN 325 MG PO TABS
325.0000 mg | ORAL_TABLET | Freq: Four times a day (QID) | ORAL | Status: DC | PRN
  Administered 2020-02-27: 650 mg via ORAL
  Administered 2020-02-27: 325 mg via ORAL
  Filled 2020-02-25 (×2): qty 2

## 2020-02-25 SURGICAL SUPPLY — 65 items
APL PRP STRL LF DISP 70% ISPRP (MISCELLANEOUS) ×2
BIT DRILL 2.5X110 QC LCP DISP (BIT) ×1 IMPLANT
BIT DRILL CANN 2.7X625 NONSTRL (BIT) ×1 IMPLANT
BIT DRILL LCP QC 2X140 (BIT) ×1 IMPLANT
BNDG COHESIVE 6X5 TAN STRL LF (GAUZE/BANDAGES/DRESSINGS) ×2 IMPLANT
BNDG ELASTIC 6X5.8 VLCR STR LF (GAUZE/BANDAGES/DRESSINGS) ×1 IMPLANT
BNDG ESMARK 6X12 TAN STRL LF (GAUZE/BANDAGES/DRESSINGS) ×2 IMPLANT
BRUSH SCRUB EZ  4% CHG (MISCELLANEOUS) ×4
BRUSH SCRUB EZ 4% CHG (MISCELLANEOUS) ×2 IMPLANT
CANISTER SUCT 1200ML W/VALVE (MISCELLANEOUS) ×2 IMPLANT
CHLORAPREP W/TINT 26 (MISCELLANEOUS) ×4 IMPLANT
COVER WAND RF STERILE (DRAPES) ×2 IMPLANT
CUFF TOURN SGL QUICK 24 (TOURNIQUET CUFF) ×2
CUFF TOURN SGL QUICK 30 (TOURNIQUET CUFF)
CUFF TRNQT CYL 24X4X16.5-23 (TOURNIQUET CUFF) IMPLANT
CUFF TRNQT CYL 30X4X21-28X (TOURNIQUET CUFF) IMPLANT
DRAPE 3/4 80X56 (DRAPES) ×2 IMPLANT
DRAPE FLUOR MINI C-ARM 54X84 (DRAPES) ×2 IMPLANT
ELECT REM PT RETURN 9FT ADLT (ELECTROSURGICAL) ×2
ELECTRODE REM PT RTRN 9FT ADLT (ELECTROSURGICAL) ×1 IMPLANT
GAUZE SPONGE 4X4 12PLY STRL (GAUZE/BANDAGES/DRESSINGS) ×1 IMPLANT
GAUZE XEROFORM 1X8 LF (GAUZE/BANDAGES/DRESSINGS) ×4 IMPLANT
GLOVE INDICATOR 8.0 STRL GRN (GLOVE) ×6 IMPLANT
GLOVE SURG ORTHO 8.0 STRL STRW (GLOVE) ×6 IMPLANT
GOWN STRL REUS W/ TWL LRG LVL3 (GOWN DISPOSABLE) ×1 IMPLANT
GOWN STRL REUS W/ TWL XL LVL3 (GOWN DISPOSABLE) ×1 IMPLANT
GOWN STRL REUS W/TWL LRG LVL3 (GOWN DISPOSABLE) ×4
GOWN STRL REUS W/TWL XL LVL3 (GOWN DISPOSABLE) ×2
GUIDEWARE NON THREAD 1.25X150 (WIRE) ×4
GUIDEWIRE NON THREAD 1.25X150 (WIRE) IMPLANT
KIT TURNOVER KIT A (KITS) ×2 IMPLANT
NS IRRIG 1000ML POUR BTL (IV SOLUTION) ×2 IMPLANT
PACK EXTREMITY (MISCELLANEOUS) ×2 IMPLANT
PAD ABD DERMACEA PRESS 5X9 (GAUZE/BANDAGES/DRESSINGS) ×8 IMPLANT
PAD CAST CTTN 4X4 STRL (SOFTGOODS) ×2 IMPLANT
PADDING CAST BLEND 3X4 NS (MISCELLANEOUS) IMPLANT
PADDING CAST BLEND 3X4YD NS (MISCELLANEOUS) ×1
PADDING CAST COTTON 4X4 STRL (SOFTGOODS) ×4
PLATE 3HOLE DISTAL FIBULA 2.7 (Plate) ×1 IMPLANT
PLATE COMPRESSION 4HOLE 112MM (Plate) ×1 IMPLANT
SCALPEL PROTECTED #10 DISP (BLADE) ×2 IMPLANT
SCALPEL PROTECTED #15 DISP (BLADE) ×2 IMPLANT
SCREW CORTEX LOW PRO 3.5X24 (Screw) ×1 IMPLANT
SCREW CORTEX LOW PRO 3.5X28 (Screw) ×2 IMPLANT
SCREW CORTEX LOW PRO 3.5X30 (Screw) ×1 IMPLANT
SCREW CORTEX LP 3.5X34MM (Screw) ×1 IMPLANT
SCREW LOCK T8 22X2.7XST VA (Screw) IMPLANT
SCREW LOCK VA ST 2.7X12 (Screw) ×1 IMPLANT
SCREW LOCK VA ST 2.7X18 (Screw) ×2 IMPLANT
SCREW LOCK VA ST 2.7X20 (Screw) ×1 IMPLANT
SCREW LOCKING 2.7X16MM VA (Screw) ×3 IMPLANT
SCREW LOCKING 2.7X22MM (Screw) ×4 IMPLANT
SCREW SHORT THREAD 4.0X40 (Screw) ×2 IMPLANT
SPLINT CAST 1 STEP 4X30 (MISCELLANEOUS) ×2 IMPLANT
SPLINT CAST 1 STEP 5X30 WHT (MISCELLANEOUS) ×1 IMPLANT
SPONGE LAP 18X18 RF (DISPOSABLE) ×3 IMPLANT
STAPLER SKIN PROX 35W (STAPLE) ×3 IMPLANT
SUT VIC AB 2-0 CT2 27 (SUTURE) ×3 IMPLANT
SUT VIC AB 2-0 SH 27 (SUTURE) ×2
SUT VIC AB 2-0 SH 27XBRD (SUTURE) ×1 IMPLANT
SUT VIC AB 3-0 SH 27 (SUTURE) ×2
SUT VIC AB 3-0 SH 27X BRD (SUTURE) ×1 IMPLANT
TAPE SURG TRANSPORE 1 IN (GAUZE/BANDAGES/DRESSINGS) ×1 IMPLANT
TAPE SURGICAL TRANSPORE 1 IN (GAUZE/BANDAGES/DRESSINGS) ×2
TOWEL OR 17X26 4PK STRL BLUE (TOWEL DISPOSABLE) ×4 IMPLANT

## 2020-02-25 NOTE — Progress Notes (Signed)
PROGRESS NOTE    [name] [name]  OEH:[id number] DOB: May 18, 1961 DOA: 02/24/2020 PCP: Antony Blackbird, MD   Brief Narrative: Taken from H&P [name] [name] is a 59 y.o. female with medical history significant for autonomic dysfunction with recurrent syncopal/presyncopal episodes on midodrine and Florinef, as well as history of poorly controlled diabetes who presented to the emergency room following a fall which is not unusual for her, in which her knees buckled under her.  This time she fell to the ground injuring her right ankle and was unable to weight-bear on it.  X-ray of the ankle showed medial malleolus fracture on the right.  Also found to have hyperglycemia which improved with insulin intervention.  Initial plan was to discharge home with outpatient orthopedic evaluation.  Later she was taken to the OR by orthopedic this morning.  Subjective: Patient has no new complaint today.  Pain was bearable when seen today.  Waiting for surgery later today.  Assessment & Plan:   Principal Problem:   Fracture of medial malleolus, right, closed Active Problems:   Recurrent syncope   Orthostatic hypotension   Hyperglycemia due to type 2 diabetes mellitus (HCC)   Autonomic dysfunction   Fall at home, initial encounter   Preoperative clearance  Fracture of medial malleolus, right, closed   Fall at home, initial encounter   Preoperative clearance.  Most likely mechanical as described by patient.  Orthostatic hypotension can be a possibility due to his history of autonomic dysfunction. -Going for ORIF today. -Follow-up postoperative recommendations. -Postoperative PT evaluation. -Continue with pain management.  Orthostatic hypotension/ Autonomic dysfunction/ Recurrent syncope. Currently blood pressure within goal. -Continue IV hydration as patient is n.p.o. for surgery. -Continue home dose of Florinef and midodrine. -Continue thigh-high Ted hose.  Hyperglycemia due to type 2 diabetes mellitus ([place])  with neuropathy. Blood sugar 646 in the emergency room with normal anion gap, improving with IV fluids and insulin.  -Add Home dose of Lantus 30 units at bedtime. -Continue supplemental insulin coverage -Check A1c. -Continue home dose of Neurontin and Cymbalta.  Objective: Vitals:   02/25/20 0930 02/25/20 1030 02/25/20 1100 02/25/20 1445  BP: 117/66 123/73 125/68 (!) 102/58  Pulse: (!) 101 (!) 105 (!) 109 (!) 112  Resp: 12 14 13 12   Temp:    (!) 97.3 F (36.3 C)  TempSrc:      SpO2: 96% 93% 94% 100%  Weight:      Height:        Intake/Output Summary (Last 24 hours) at 02/25/2020 1452 Last data filed at 02/25/2020 1437 Gross per 24 hour  Intake 1800 ml  Output 1100 ml  Net 700 ml   Filed Weights   02/24/20 1734  Weight: 63.5 kg    Examination:  General exam: Appears calm and comfortable  Respiratory system: Clear to auscultation. Respiratory effort normal. Cardiovascular system: S1 & S2 heard, RRR. No JVD, murmurs, Gastrointestinal system: Soft, nontender, nondistended, bowel sounds positive. Central nervous system: Alert and oriented. No focal neurological deficits.Symmetric 5 x 5 power. Extremities: No edema, no cyanosis, pulses intact and symmetrical.  Right foot with Ace wrap. Psychiatry: Judgement and insight appear normal. Mood & affect appropriate.    DVT prophylaxis: SCDs.  Lovenox can be given postoperatively Code Status: Full Family Communication: Discussed with patient Disposition Plan:  Status is: Inpatient  Remains inpatient appropriate because:Inpatient level of care appropriate due to severity of illness   Dispo: The patient is from: Home  Anticipated d/c is to: Home              Anticipated d/c date is: 1 day              Patient currently is not medically stable to d/c.  Consultants:   Orthopedic  Procedures:  Antimicrobials:   Data Reviewed: I have personally reviewed following labs and imaging studies  CBC: Recent Labs   Lab 02/24/20 1741  WBC 6.4  HGB 12.6  HCT 36.8  MCV 91.5  PLT 240   Basic Metabolic Panel: Recent Labs  Lab 02/24/20 1741  NA 128*  K 4.4  CL 96*  CO2 20*  GLUCOSE 646*  BUN 9  CREATININE 0.70  CALCIUM 8.2*   GFR: Estimated Creatinine Clearance: 70 mL/min (by C-G formula based on SCr of 0.7 mg/dL). Liver Function Tests: Recent Labs  Lab 02/24/20 1741  AST 32  ALT 32  ALKPHOS 123  BILITOT 0.8  PROT 6.4*  ALBUMIN 3.4*   No results for input(s): LIPASE, AMYLASE in the last 168 hours. No results for input(s): AMMONIA in the last 168 hours. Coagulation Profile: No results for input(s): INR, PROTIME in the last 168 hours. Cardiac Enzymes: No results for input(s): CKTOTAL, CKMB, CKMBINDEX, TROPONINI in the last 168 hours. BNP (last 3 results) No results for input(s): PROBNP in the last 8760 hours. HbA1C: No results for input(s): HGBA1C in the last 72 hours. CBG: Recent Labs  Lab 02/24/20 1742 02/24/20 2147 02/25/20 0548 02/25/20 0825  GLUCAP >600* 306* 291* 271*   Lipid Profile: No results for input(s): CHOL, HDL, LDLCALC, TRIG, CHOLHDL, LDLDIRECT in the last 72 hours. Thyroid Function Tests: No results for input(s): TSH, T4TOTAL, FREET4, T3FREE, THYROIDAB in the last 72 hours. Anemia Panel: No results for input(s): VITAMINB12, FOLATE, FERRITIN, TIBC, IRON, RETICCTPCT in the last 72 hours. Sepsis Labs: No results for input(s): PROCALCITON, LATICACIDVEN in the last 168 hours.  Recent Results (from the past 240 hour(s))  SARS Coronavirus 2 by RT PCR (hospital order, performed in [facility] hospital lab) Nasopharyngeal Nasopharyngeal Swab     Status: None   Collection Time: 02/25/20 12:07 AM   Specimen: Nasopharyngeal Swab  Result Value Ref Range Status   SARS Coronavirus 2 NEGATIVE NEGATIVE Final    Comment: (NOTE) SARS-CoV-2 target nucleic acids are NOT DETECTED.  The SARS-CoV-2 RNA is generally detectable in upper and lower respiratory specimens  during the acute phase of infection. The lowest concentration of SARS-CoV-2 viral copies this assay can detect is 250 copies / mL. A negative result does not preclude SARS-CoV-2 infection and should not be used as the sole basis for treatment or other patient management decisions.  A negative result may occur with improper specimen collection / handling, submission of specimen other than nasopharyngeal swab, presence of viral mutation(s) within the areas targeted by this assay, and inadequate number of viral copies (<250 copies / mL). A negative result must be combined with clinical observations, patient history, and epidemiological information.  Fact Sheet for Patients:   StrictlyIdeas.no  Fact Sheet for Healthcare Providers: BankingDealers.co.za  This test is not yet approved or  cleared by the [place] FDA and has been authorized for detection and/or diagnosis of SARS-CoV-2 by FDA under an Emergency Use Authorization (EUA).  This EUA will remain in effect (meaning this test can be used) for the duration of the COVID-19 declaration under Section 564(b)(1) of the Act, 21 U.S.C. section 360bbb-3(b)(1), unless the authorization is terminated or revoked  sooner.  Performed at [facility], [address] [place], [place] [address]      Radiology Studies: DG Tibia/Fibula Right  Result Date: 02/24/2020 CLINICAL DATA:  Status post fall. EXAM: RIGHT TIBIA AND FIBULA - 2 VIEW COMPARISON:  None. FINDINGS: Acute fracture deformities are seen involving the right medial malleolus and right posterior malleolus. An additional fracture is seen along the distal right fibula, just above the right lateral malleolus. There is no evidence of dislocation. Moderate severity diffuse soft tissue swelling is noted. IMPRESSION: Acute fractures of the right medial malleolus, right posterior malleolus and distal right fibula. Electronically Signed    By: Virgina Norfolk M.D.   On: 02/24/2020 19:03   DG Ankle Complete Right  Result Date: 02/24/2020 CLINICAL DATA:  Fall.  Ankle deformity EXAM: RIGHT ANKLE - COMPLETE 3+ VIEW COMPARISON:  None. FINDINGS: Fracture of the medial malleolus at the level of the talar dome. Slight medial subluxation of the tibia in relation to the talar dome. Fracture of the distal fibula at the metaphysis. Lateral angulation of the distal fibula. IMPRESSION: 1. Fracture the medial malleolus. 2. Mild medial subluxation at the tibiotalar joint. 3. Fracture of the distal fibula at the metaphysis. Electronically Signed   By: Suzy Bouchard M.D.   On: 02/24/2020 18:25   DG MINI C-ARM IMAGE ONLY  Result Date: 02/25/2020 There is no interpretation for this exam.  This order is for images obtained during a surgical procedure.  Please See "Surgeries" Tab for more information regarding the procedure.    Scheduled Meds: . [MAR Hold] insulin aspart  0-9 Units Subcutaneous Q4H   Continuous Infusions: . sodium chloride 100 mL/hr at 02/25/20 1205  . [MAR Hold]  ceFAZolin (ANCEF) IV       LOS: 0 days   Time spent: 35 minutes.  Lorella Nimrod, MD [facility]  If 7PM-7AM, please contact night-coverage [email or web address]  02/25/2020, 2:52 PM   This record has been created using Systems analyst. Errors have been sought and corrected,but may not always be located. Such creation errors do not reflect on the standard of care.

## 2020-02-25 NOTE — Transfer of Care (Signed)
Immediate Anesthesia Transfer of Care Note  Patient: [name] [name]  Procedure(s) Performed: OPEN REDUCTION INTERNAL FIXATION (ORIF) ANKLE FRACTURE (Right Ankle)  Patient Location: PACU  Anesthesia Type:General  Level of Consciousness: awake, drowsy and patient cooperative  Airway & Oxygen Therapy: Patient Spontanous Breathing and Patient connected to face mask oxygen  Post-op Assessment: Report given to RN and Post -op Vital signs reviewed and stable  Post vital signs: Reviewed and stable  Last Vitals:  Vitals Value Taken Time  BP 102/58 02/25/20 1449  Temp    Pulse 117 02/25/20 1454  Resp 15 02/25/20 1454  SpO2 100 % 02/25/20 1454  Vitals shown include unvalidated device data.  Last Pain:  Vitals:   02/25/20 1445  TempSrc:   PainSc: Asleep         Complications: No complications documented.

## 2020-02-25 NOTE — Progress Notes (Signed)
Patient arrived from PACU following ORIF to right ankle( medial malleollus). Patient is alert and  answers questions appropriately. Ice to affected ankle. Lungs clear, HR= 100, positive BS, IV LR at 60ml/hr Right Hand. IV Toradol given for pain.

## 2020-02-25 NOTE — Progress Notes (Signed)
Dr. Lubertha Basque spoke with daughter via phone to notify her that her mother is going into surgery.  Mom did not know phone number for any family members.

## 2020-02-25 NOTE — Anesthesia Procedure Notes (Addendum)
Procedure Name: Intubation Performed by: Fletcher-Harrison, Nicco Reaume, CRNA Pre-anesthesia Checklist: Patient identified, Emergency Drugs available, Suction available and Patient being monitored Patient Re-evaluated:Patient Re-evaluated prior to induction Oxygen Delivery Method: Circle system utilized Preoxygenation: Pre-oxygenation with 100% oxygen Induction Type: IV induction Ventilation: Mask ventilation without difficulty Laryngoscope Size: McGraph and 3 Grade View: Grade I Tube type: Oral Tube size: 6.5 mm Number of attempts: 1 Airway Equipment and Method: Stylet and Oral airway Placement Confirmation: ETT inserted through vocal cords under direct vision,  positive ETCO2,  breath sounds checked- equal and bilateral and CO2 detector Secured at: 21 cm Tube secured with: Tape Dental Injury: Teeth and Oropharynx as per pre-operative assessment        

## 2020-02-25 NOTE — Op Note (Signed)
  02/25/2020  2:56 PM  PATIENT:  [name] [name]  59 y.o. female  PRE-OPERATIVE DIAGNOSIS:  right ankle fracture  POST-OPERATIVE DIAGNOSIS:  Right trimalleolar fracture with medial tibial fracture extension  PROCEDURE:  OPEN REDUCTION INTERNAL FIXATION (ORIF) ANKLE FRACTURE, RIGHT  SURGEON:  Kurtis Bushman, MD  ASST:  none  ANESTHESIA:   General  EBL:  50 mL  TOURNIQUET TIME:  88 min @ 225 mmHg  OPERATIVE FINDINGS:  Unstable trimalleolar fracture, there was severe comminution and poor bone quality. The medial malleolus fracture extended proximally with severe comminution requiring additional fixation.  OPERATIVE PROCEDURE:   The patient was brought to the operating room and placed in the supine position. All bony prominences were padded. General anesthesia was administered. The lower extremity was prepped and draped in the usual sterile fashion. The leg was elevated and exsanguinated and the tourniquet was inflated. Time out was performed.   The syndesmosis was stressed using live fluoroscopy and found to be unstable.   Incision was made over the distal fibula and the fracture was exposed and reduced anatomically with a clamp. The bone quality was poor and a lag screw was not possible. A lateral locking plate was utilized and 4 distal locking screws were placed. A reduction maneuver was performed and the plate was then compressed to the bone with a 3.5 mm syndesmotic screw. Two additional screws were placed proximally, 2 of the 3 proximal screws were syndesmotic screws and the most proximal was a 2.7 mm locking screw.  I used the Fluoroscan to confirm satisfactory reduction and fixation. This wound was irrigated and closed with vicryl sutures and staples.  I then turned my attention to the medial malleolus. Incision was made over the medial malleolus and the fracture exposed and held provisionally with a clamp.  Again, severe comminution was noted and without stable proximal bone.  2 guidepins  were placed for the 4.0 mm cannulated screws and then confirmation of reduction was made with fluoroscopy. I then placed 2  58mm screws which had satisfactory fixation. A medial locking plate was then placed with 4 locking screws distally and 3 bicortical screws proximally. There was excellent fixation. Fluoroscopy showed good reduction and hardware placement.    The medial wound was irrigated, and closed with vicryl and staples. Sponge and needle counts were correct. Sterile gauze was applied followed by a posterior splint. She was awakened and returned to the PACU in stable and satisfactory condition. There were no apparent complications.  Kurtis Bushman, MD

## 2020-02-25 NOTE — H&P (Signed)
History and Physical    [name] [name] KZL:[id number] DOB: 01-31-1961 DOA: 02/24/2020  PCP: Antony Blackbird, MD   Patient coming from: Home  I have personally briefly reviewed patient's old medical records in [place]  Chief Complaint: Fall  HPI: [name] [name] is a 59 y.o. female with medical history significant for autonomic dysfunction with recurrent syncopal/presyncopal episodes on midodrine and Florinef, as well as history of poorly controlled diabetes who presented to the emergency room following a fall which is not unusual for her, in which her knees buckled under her.  This time she fell to the ground injuring her right ankle and was unable to weight-bear on it.  Patient has had many of these episodes before and has had frequent hospitalizations and has had extensive work-up before.  She did not hit her head she did not lose consciousness ED Course: On arrival she was awake and alert with normal vital signs and initial work-up showed a serum blood sugar of 646 with normal anion gap.  X-ray of the ankle showed medial malleolus fracture on the right.  The emergency room provider spoke with orthopedist Dr. Harlow Mares who advised that patient can be discharged from an orthopedic standpoint for outpatient follow-up to arrange for surgery at a later date.  Patient was hydrated and treated with IV insulin with improvement in her blood sugar to the 300s, however when they tried to discharge her with crutches, she had another orthostatic episode with systolic blood pressure falling to the 80s.  Hospitalist consult for admission was requested at that time.  Dr. Harlow Mares was again contacted by ED provider and will consider doing procedure as inpatient. Review of Systems: As per HPI otherwise all other systems on review of systems negative.    Past Medical History:  Diagnosis Date  . Cervical disc disorder with radiculopathy of cervical region   . Chronic low back pain    HNP  . Chronic pain syndrome   .  Degenerative joint disease   . Depression dx 1997  . Diabetes mellitus, type 2 ([place]) 2011   No insulin  . Diabetic polyneuropathy ([place])   . GERD (gastroesophageal reflux disease)   . Glaucoma   . Headache(784.0)   . Herniated disc   . Hypertension    Lab 11/2011:  CXR, EKG, CBC, TSH, BMet, troponin-normal; lipid profile: 188, 131, 36, 126   . Lumbar herniated disc   . Non-compliance   . Pancreatic cyst    Endoscopic aspiration in 09/2009  . Pneumonia 08/02/2012  . Shingles   . 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 Torn meniscus   . Vertigo     Past Surgical History:  Procedure Laterality Date  . ABDOMINAL HYSTERECTOMY    . CESAREAN SECTION       reports that she quit smoking about 41 years ago. She has never used smokeless tobacco. She reports that she does not drink alcohol and does not use drugs.  Allergies  Allergen Reactions  . Tramadol Nausea And Vomiting    REACTION: Projectile vomiting    Family History  Problem Relation Age of Onset  . Depression Mother        type 1  . Diabetes Mother   . Renal Disease Mother   . Lung cancer Father   . Heart attack Brother        Died age 39 - was told due to massive MI/heart "exploded"  . Heart failure Maternal Grandmother  Details not totally clear      Prior to Admission medications   Medication Sig Start Date End Date Taking? Authorizing Provider  acetaminophen (TYLENOL) 500 MG tablet Take 2 tablets (1,000 mg total) by mouth every 6 (six) hours as needed for mild pain or headache (pain). 07/02/19   Domenic Polite, MD  Cholecalciferol (VITAMIN D-3) 125 MCG (5000 UT) TABS Take 1 tablet by mouth daily. 01/24/20   Hilts, Legrand Como, MD  diclofenac Sodium (VOLTAREN) 1 % GEL Apply 1 application topically 4 (four) times daily. Patient not taking: Reported on 01/24/2020 01/16/20   Camillia Herter, NP  DULoxetine (CYMBALTA) 60 MG capsule TAKE 1 CAPSULE (60 MG TOTAL) BY MOUTH 2 (TWO) TIMES DAILY. 02/01/20   Fulp,  Cammie, MD  fludrocortisone (FLORINEF) 0.1 MG tablet TAKE 1 TABLET (0.1 MG TOTAL) BY MOUTH DAILY. 10/04/19   Lorretta Harp, MD  gabapentin (NEURONTIN) 600 MG tablet TAKE ONE TABLET (600 MG) BY MOUTH EVERY MORNING AND AFTERNOON, TAKE TWO TABLETS AT NIGHT 02/06/20   Fulp, Cammie, MD  Glucosamine Sulfate 1000 MG CAPS Take 1 capsule (1,000 mg total) by mouth 2 (two) times daily. 01/24/20   Hilts, Legrand Como, MD  glucose blood test strip [employer] Testing Strips Use as instructed 08/18/17   Lockamy, Timothy, DO  HUMALOG KWIKPEN 100 UNIT/ML KwikPen Inject 0.1 mLs (10 Units total) into the skin 3 (three) times daily. Patient taking differently: Inject 10 Units into the skin daily at 12 noon. Per sliding scale 03/15/19   Fulp, Cammie, MD  Insulin Glargine (LANTUS SOLOSTAR) 100 UNIT/ML Solostar Pen Inject 30 Units into the skin daily. 09/07/19   Black, Lezlie Octave, NP  Insulin Pen Needle (PEN NEEDLES) 31G X 8 MM MISC 1 Units by Does not apply route daily. 08/20/17   Nuala Alpha, DO  Insulin Pen Needle (PEN NEEDLES) 31G X 8 MM MISC Use as directed 09/07/19   Eugenie Filler, MD  meloxicam (MOBIC) 15 MG tablet Take 0.5-1 tablets (7.5-15 mg total) by mouth daily as needed for pain. 01/24/20   Hilts, Legrand Como, MD  midodrine (PROAMATINE) 10 MG tablet Take 1 tablet (10 mg total) by mouth 2 (two) times daily with a meal. 09/07/19   Black, Lezlie Octave, NP  Turmeric 500 MG CAPS Take 500 mg by mouth 2 (two) times daily. 01/24/20   Hilts, Legrand Como, MD    Physical Exam: Vitals:   02/24/20 2100 02/24/20 2149 02/24/20 2318 02/24/20 2330  BP: (!) 154/86 131/81 (!) 79/49 99/60  Pulse: (!) 117 (!) 114  (!) 112  Resp: 11 16  16   Temp:  98 F (36.7 C)    TempSrc:  Oral    SpO2: 96% 96%  93%  Weight:      Height:         Vitals:   02/24/20 2100 02/24/20 2149 02/24/20 2318 02/24/20 2330  BP: (!) 154/86 131/81 (!) 79/49 99/60  Pulse: (!) 117 (!) 114  (!) 112  Resp: 11 16  16   Temp:  98 F (36.7 C)    TempSrc:  Oral    SpO2:  96% 96%  93%  Weight:      Height:          Constitutional: Alert and oriented x 3 . Not in any apparent distress HEENT:      Head: Normocephalic and atraumatic.         Eyes: PERLA, EOMI, Conjunctivae are normal. Sclera is non-icteric.       Mouth/Throat: Mucous  membranes are moist.       Neck: Supple with no signs of meningismus. Cardiovascular: Regular rate and rhythm. No murmurs, gallops, or rubs. 2+ symmetrical distal pulses are present . No JVD. No LE edema Respiratory: Respiratory effort normal .Lungs sounds clear bilaterally. No wheezes, crackles, or rhonchi.  Gastrointestinal: Soft, non tender, and non distended with positive bowel sounds. No rebound or guarding. Genitourinary: No CVA tenderness. Musculoskeletal:  Right ankle in splint.  No cyanosis, or erythema of extremities. Neurologic: Normal speech and language. Face is symmetric. Moving all extremities. No gross focal neurologic deficits . Skin: Skin is warm, dry.  No rash or ulcers Psychiatric: Mood and affect are normal Speech and behavior are normal   Labs on Admission: I have personally reviewed following labs and imaging studies  CBC: Recent Labs  Lab 02/24/20 1741  WBC 6.4  HGB 12.6  HCT 36.8  MCV 91.5  PLT 654   Basic Metabolic Panel: Recent Labs  Lab 02/24/20 1741  NA 128*  K 4.4  CL 96*  CO2 20*  GLUCOSE 646*  BUN 9  CREATININE 0.70  CALCIUM 8.2*   GFR: Estimated Creatinine Clearance: 70 mL/min (by C-G formula based on SCr of 0.7 mg/dL). Liver Function Tests: Recent Labs  Lab 02/24/20 1741  AST 32  ALT 32  ALKPHOS 123  BILITOT 0.8  PROT 6.4*  ALBUMIN 3.4*   No results for input(s): LIPASE, AMYLASE in the last 168 hours. No results for input(s): AMMONIA in the last 168 hours. Coagulation Profile: No results for input(s): INR, PROTIME in the last 168 hours. Cardiac Enzymes: No results for input(s): CKTOTAL, CKMB, CKMBINDEX, TROPONINI in the last 168 hours. BNP (last 3  results) No results for input(s): PROBNP in the last 8760 hours. HbA1C: No results for input(s): HGBA1C in the last 72 hours. CBG: Recent Labs  Lab 02/24/20 1742 02/24/20 2147  GLUCAP >600* 306*   Lipid Profile: No results for input(s): CHOL, HDL, LDLCALC, TRIG, CHOLHDL, LDLDIRECT in the last 72 hours. Thyroid Function Tests: No results for input(s): TSH, T4TOTAL, FREET4, T3FREE, THYROIDAB in the last 72 hours. Anemia Panel: No results for input(s): VITAMINB12, FOLATE, FERRITIN, TIBC, IRON, RETICCTPCT in the last 72 hours. Urine analysis:    Component Value Date/Time   COLORURINE STRAW (A) 12/08/2019 1125   APPEARANCEUR CLEAR (A) 12/08/2019 1125   APPEARANCEUR Clear 03/15/2019 1056   LABSPEC 1.016 12/08/2019 1125   PHURINE 6.0 12/08/2019 1125   GLUCOSEU >=500 (A) 12/08/2019 1125   HGBUR NEGATIVE 12/08/2019 1125   BILIRUBINUR NEGATIVE 12/08/2019 1125   BILIRUBINUR negative 08/09/2019 1720   BILIRUBINUR Negative 03/15/2019 1056   KETONESUR 5 (A) 12/08/2019 1125   PROTEINUR NEGATIVE 12/08/2019 1125   UROBILINOGEN 0.2 08/09/2019 1720   UROBILINOGEN 0.2 11/12/2013 1755   NITRITE NEGATIVE 12/08/2019 1125   LEUKOCYTESUR NEGATIVE 12/08/2019 1125    Radiological Exams on Admission: DG Tibia/Fibula Right  Result Date: 02/24/2020 CLINICAL DATA:  Status post fall. EXAM: RIGHT TIBIA AND FIBULA - 2 VIEW COMPARISON:  None. FINDINGS: Acute fracture deformities are seen involving the right medial malleolus and right posterior malleolus. An additional fracture is seen along the distal right fibula, just above the right lateral malleolus. There is no evidence of dislocation. Moderate severity diffuse soft tissue swelling is noted. IMPRESSION: Acute fractures of the right medial malleolus, right posterior malleolus and distal right fibula. Electronically Signed   By: Virgina Norfolk M.D.   On: 02/24/2020 19:03   DG Ankle Complete Right  Result Date: 02/24/2020 CLINICAL DATA:  Fall.  Ankle  deformity EXAM: RIGHT ANKLE - COMPLETE 3+ VIEW COMPARISON:  None. FINDINGS: Fracture of the medial malleolus at the level of the talar dome. Slight medial subluxation of the tibia in relation to the talar dome. Fracture of the distal fibula at the metaphysis. Lateral angulation of the distal fibula. IMPRESSION: 1. Fracture the medial malleolus. 2. Mild medial subluxation at the tibiotalar joint. 3. Fracture of the distal fibula at the metaphysis. Electronically Signed   By: Suzy Bouchard M.D.   On: 02/24/2020 18:25    EKG: Independently reviewed. Interpretation : Sinus tachycardia  Assessment/Plan 59 year old female with history of diabetes, autonomic dysfunction with recurrent syncopal/presyncopal episodes on midodrine and Florinef,  who presented to the emergency room following a fall sustaining right ankle fracture.  Hyperglycemic and orthostatic in the ER.   Fracture of medial malleolus, right, closed   Fall at home, initial encounter   Preoperative clearance -Fall, suspect related to orthostatic hypotension/presyncope without loss of consciousness -Patient has been thoroughly evaluated in the past with several hospitalizations for same so will not repeat syncopal work-up -IV hydration.  Continue Florinef and midodrine pending med rec -N.p.o. for possible orthopedic repair in the a.m. -Orthopedic consult    Hyperglycemia due to type 2 diabetes mellitus ([place]) -Blood sugar 646 in the emergency room with normal anion gap, improving with IV fluids and insulin -Continue supplemental insulin coverage pending med rec -Follow A1c    Orthostatic hypotension   Autonomic dysfunction   Recurrent syncope -IV hydration -Continue Florinef pending med rec.  Continue midodrine if needed -Permissive supine hypertension to prevent orthostatic drops -Physical therapy evaluation -Thigh-high TED stockings if available, behavioral precautions to avoid falls    DVT prophylaxis: SCD on unaffected  leg Code Status: full code  Family Communication:  none  Disposition Plan: Back to previous home environment Consults called: Orthopedics Status: Observation    Athena Masse MD [facility]     02/25/2020, 12:30 AM

## 2020-02-25 NOTE — Consult Note (Signed)
ORTHOPAEDIC CONSULTATION  REQUESTING PHYSICIAN: Lorella Nimrod, MD  Chief Complaint: Right ankle pain  HPI: [name] [name] is a 59 y.o. female who complains of right ankle pain after fall. Please see H&P and ED notes for details. Denies any numbness, tingling or constitutional symptoms.  Past Medical History:  Diagnosis Date  . Cervical disc disorder with radiculopathy of cervical region   . Chronic low back pain    HNP  . Chronic pain syndrome   . Degenerative joint disease   . Depression dx 1997  . Diabetes mellitus, type 2 ([place]) 2011   No insulin  . Diabetic polyneuropathy ([place])   . GERD (gastroesophageal reflux disease)   . Glaucoma   . Headache(784.0)   . Herniated disc   . Hypertension    Lab 11/2011:  CXR, EKG, CBC, TSH, BMet, troponin-normal; lipid profile: 188, 131, 36, 126   . Lumbar herniated disc   . Non-compliance   . Pancreatic cyst    Endoscopic aspiration in 09/2009  . Pneumonia 08/02/2012  . Shingles   . 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 Torn meniscus   . Vertigo    Past Surgical History:  Procedure Laterality Date  . ABDOMINAL HYSTERECTOMY    . CESAREAN SECTION     Social History   Socioeconomic History  . Marital status: Single    Spouse name: Not on file  . Number of children: Not on file  . Years of education: Not on file  . Highest education level: Not on file  Occupational History  . Not on file  Tobacco Use  . Smoking status: Former Smoker    Quit date: 08/03/1978    Years since quitting: 41.5  . Smokeless tobacco: Never Used  . Tobacco comment: Smoked age [age]  Substance and Sexual Activity  . Alcohol use: No    Comment: Former  . Drug use: No  . Sexual activity: Yes    Birth control/protection: Surgical  Other Topics Concern  . 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    Social Determinants of Health   Financial Resource Strain:   . Difficulty of Paying Living  Expenses:   Food Insecurity:   . Worried About Charity fundraiser in the Last Year:   . Arboriculturist in the Last Year:   Transportation Needs:   . Film/video editor (Medical):   Marland Kitchen Lack of Transportation (Non-Medical):   Physical Activity:   . Days of Exercise per Week:   . Minutes of Exercise per Session:   Stress:   . Feeling of Stress :   Social Connections:   . Frequency of Communication with Friends and Family:   . Frequency of Social Gatherings with Friends and Family:   . Attends Religious Services:   . Active Member of Clubs or Organizations:   . Attends Archivist Meetings:   Marland Kitchen Marital Status:    Family History  Problem Relation Age of Onset  . Depression Mother        type 1  . Diabetes Mother   . Renal Disease Mother   . Lung cancer Father   . Heart attack Brother        Died age 38 - was told due to massive MI/heart "exploded"  . Heart failure Maternal Grandmother        Details not totally clear  Allergies  Allergen Reactions  . Tramadol Nausea And Vomiting    REACTION: Projectile vomiting   Prior to Admission medications   Medication Sig Start Date End Date Taking? Authorizing Provider  acetaminophen (TYLENOL) 500 MG tablet Take 2 tablets (1,000 mg total) by mouth every 6 (six) hours as needed for mild pain or headache (pain). 07/02/19  Yes Domenic Polite, MD  Cholecalciferol (VITAMIN D-3) 125 MCG (5000 UT) TABS Take 1 tablet by mouth daily. 01/24/20  Yes Hilts, Legrand Como, MD  DULoxetine (CYMBALTA) 60 MG capsule TAKE 1 CAPSULE (60 MG TOTAL) BY MOUTH 2 (TWO) TIMES DAILY. 02/01/20  Yes Fulp, Cammie, MD  fludrocortisone (FLORINEF) 0.1 MG tablet TAKE 1 TABLET (0.1 MG TOTAL) BY MOUTH DAILY. 10/04/19  Yes Lorretta Harp, MD  gabapentin (NEURONTIN) 600 MG tablet TAKE ONE TABLET (600 MG) BY MOUTH EVERY MORNING AND AFTERNOON, TAKE TWO TABLETS AT NIGHT 02/06/20  Yes Fulp, Cammie, MD  HUMALOG KWIKPEN 100 UNIT/ML KwikPen Inject 0.1 mLs (10 Units total)  into the skin 3 (three) times daily. Patient taking differently: Inject 10 Units into the skin daily at 12 noon. Per sliding scale 03/15/19  Yes Fulp, Cammie, MD  Insulin Glargine (LANTUS SOLOSTAR) 100 UNIT/ML Solostar Pen Inject 30 Units into the skin daily. 09/07/19  Yes Black, Lezlie Octave, NP  meloxicam (MOBIC) 15 MG tablet Take 0.5-1 tablets (7.5-15 mg total) by mouth daily as needed for pain. 01/24/20  Yes Hilts, Legrand Como, MD  diclofenac Sodium (VOLTAREN) 1 % GEL Apply 1 application topically 4 (four) times daily. Patient not taking: Reported on 01/24/2020 01/16/20   Camillia Herter, NP  Glucosamine Sulfate 1000 MG CAPS Take 1 capsule (1,000 mg total) by mouth 2 (two) times daily. Patient not taking: Reported on 02/25/2020 01/24/20   Hilts, Michael, MD  glucose blood test strip [employer] Testing Strips Use as instructed 08/18/17   Nuala Alpha, DO  Insulin Pen Needle (PEN NEEDLES) 31G X 8 MM MISC 1 Units by Does not apply route daily. 08/20/17   Nuala Alpha, DO  Insulin Pen Needle (PEN NEEDLES) 31G X 8 MM MISC Use as directed 09/07/19   Eugenie Filler, MD  midodrine (PROAMATINE) 10 MG tablet Take 1 tablet (10 mg total) by mouth 2 (two) times daily with a meal. Patient not taking: Reported on 02/25/2020 09/07/19   Radene Gunning, NP  Turmeric 500 MG CAPS Take 500 mg by mouth 2 (two) times daily. Patient not taking: Reported on 02/25/2020 01/24/20   Eunice Blase, MD   DG Tibia/Fibula Right  Result Date: 02/24/2020 CLINICAL DATA:  Status post fall. EXAM: RIGHT TIBIA AND FIBULA - 2 VIEW COMPARISON:  None. FINDINGS: Acute fracture deformities are seen involving the right medial malleolus and right posterior malleolus. An additional fracture is seen along the distal right fibula, just above the right lateral malleolus. There is no evidence of dislocation. Moderate severity diffuse soft tissue swelling is noted. IMPRESSION: Acute fractures of the right medial malleolus, right posterior malleolus and distal right  fibula. Electronically Signed   By: Virgina Norfolk M.D.   On: 02/24/2020 19:03   DG Ankle Complete Right  Result Date: 02/24/2020 CLINICAL DATA:  Fall.  Ankle deformity EXAM: RIGHT ANKLE - COMPLETE 3+ VIEW COMPARISON:  None. FINDINGS: Fracture of the medial malleolus at the level of the talar dome. Slight medial subluxation of the tibia in relation to the talar dome. Fracture of the distal fibula at the metaphysis. Lateral angulation of the distal fibula. IMPRESSION: 1.  Fracture the medial malleolus. 2. Mild medial subluxation at the tibiotalar joint. 3. Fracture of the distal fibula at the metaphysis. Electronically Signed   By: Suzy Bouchard M.D.   On: 02/24/2020 18:25    Positive ROS: All other systems have been reviewed and were otherwise negative with the exception of those mentioned in the HPI and as above.  Physical Exam: General: Alert, no acute distress Cardiovascular: No pedal edema Respiratory: No cyanosis, no use of accessory musculature GI: No organomegaly, abdomen is soft and non-tender Skin: No lesions in the area of chief complaint Neurologic: Sensation intact distally Psychiatric: Patient is competent for consent with normal mood and affect Lymphatic: No axillary or cervical lymphadenopathy  MUSCULOSKELETAL: toes up/down. Splint clean and dry. Compartments soft. Good cap refill. Motor and sensory intact distally.  Assessment: Right ankle fracture, trimalleolar, closed, displaced  Plan: Right ankle ORIF today.  The diagnosis, risks, benefits and alternatives to treatment are all discussed in detail with the patient. Risks include but are not limited to bleeding, infection, deep vein thrombosis, pulmonary embolism, nerve or vascular injury, non-union, repeat operation, persistent pain, weakness, stiffness and death. She understands and is eager to proceed.     Lovell Sheehan, MD    02/25/2020 9:34 AM

## 2020-02-25 NOTE — Anesthesia Preprocedure Evaluation (Addendum)
Anesthesia Evaluation  Patient identified by MRN, date of birth, ID band Patient awake    Reviewed: Allergy & Precautions, H&P , NPO status , Patient's Chart, lab work & pertinent test results  History of Anesthesia Complications Negative for: history of anesthetic complications  Airway Mallampati: II  TM Distance: <3 FB Neck ROM: full    Dental  (+) Missing   Pulmonary neg sleep apnea, neg COPD, former smoker,    breath sounds clear to auscultation       Cardiovascular (-) angina(-) Past MI and (-) Cardiac Stents (-) dysrhythmias  Rhythm:regular Rate:Normal  Low blood pressures   Neuro/Psych  Headaches, PSYCHIATRIC DISORDERS Depression Autonomic dysfunction, orthostatic hypotension    GI/Hepatic Neg liver ROS, GERD  ,  Endo/Other  diabetes  Renal/GU      Musculoskeletal  (+) Arthritis ,   Abdominal   Peds  Hematology negative hematology ROS (+)   Anesthesia Other Findings Past Medical History: No date: Cervical disc disorder with radiculopathy of cervical region No date: Chronic low back pain     Comment:  HNP No date: Chronic pain syndrome No date: Degenerative joint disease dx 1997: Depression 2011: Diabetes mellitus, type 2 ([facility])     Comment:  No insulin No date: Diabetic polyneuropathy ([facility]) No date: GERD (gastroesophageal reflux disease) No date: Glaucoma No date: Headache(784.0) No date: Herniated disc No date: Hypertension     Comment:  Lab 11/2011:  CXR, EKG, CBC, TSH, BMet, troponin-normal;               lipid profile: 188, 131, 36, 126  No date: Lumbar herniated disc No date: Non-compliance No date: Pancreatic cyst     Comment:  Endoscopic aspiration in 09/2009 08/02/2012: Pneumonia No date: Shingles No date: Tooth loss     Comment:  due to degeneration of jaw bone No date: Torn meniscus No date: Vertigo  Past Surgical History: No date: ABDOMINAL HYSTERECTOMY No date: CESAREAN  SECTION  BMI    Body Mass Index: 22.60 kg/m      Reproductive/Obstetrics negative OB ROS                            Anesthesia Physical Anesthesia Plan  ASA: II  Anesthesia Plan: General ETT   Post-op Pain Management:    Induction:   PONV Risk Score and Plan: Ondansetron, Dexamethasone, Midazolam and Treatment may vary due to age or medical condition  Airway Management Planned:   Additional Equipment:   Intra-op Plan:   Post-operative Plan:   Informed Consent: I have reviewed the patients History and Physical, chart, labs and discussed the procedure including the risks, benefits and alternatives for the proposed anesthesia with the patient or authorized representative who has indicated his/her understanding and acceptance.     Dental Advisory Given  Plan Discussed with: Anesthesiologist, CRNA and Surgeon  Anesthesia Plan Comments:        Anesthesia Quick Evaluation

## 2020-02-26 ENCOUNTER — Encounter: Payer: Self-pay | Admitting: Orthopedic Surgery

## 2020-02-26 DIAGNOSIS — R55 Syncope and collapse: Secondary | ICD-10-CM

## 2020-02-26 DIAGNOSIS — Z794 Long term (current) use of insulin: Secondary | ICD-10-CM

## 2020-02-26 DIAGNOSIS — Y92009 Unspecified place in unspecified non-institutional (private) residence as the place of occurrence of the external cause: Secondary | ICD-10-CM

## 2020-02-26 DIAGNOSIS — W19XXXA Unspecified fall, initial encounter: Secondary | ICD-10-CM

## 2020-02-26 DIAGNOSIS — G909 Disorder of the autonomic nervous system, unspecified: Secondary | ICD-10-CM

## 2020-02-26 DIAGNOSIS — E1165 Type 2 diabetes mellitus with hyperglycemia: Secondary | ICD-10-CM

## 2020-02-26 LAB — GLUCOSE, CAPILLARY
Glucose-Capillary: 171 mg/dL — ABNORMAL HIGH (ref 70–99)
Glucose-Capillary: 175 mg/dL — ABNORMAL HIGH (ref 70–99)
Glucose-Capillary: 226 mg/dL — ABNORMAL HIGH (ref 70–99)
Glucose-Capillary: 246 mg/dL — ABNORMAL HIGH (ref 70–99)
Glucose-Capillary: 247 mg/dL — ABNORMAL HIGH (ref 70–99)
Glucose-Capillary: 259 mg/dL — ABNORMAL HIGH (ref 70–99)
Glucose-Capillary: 329 mg/dL — ABNORMAL HIGH (ref 70–99)

## 2020-02-26 LAB — BASIC METABOLIC PANEL
Anion gap: 5 (ref 5–15)
BUN: 6 mg/dL (ref 6–20)
CO2: 26 mmol/L (ref 22–32)
Calcium: 8.1 mg/dL — ABNORMAL LOW (ref 8.9–10.3)
Chloride: 105 mmol/L (ref 98–111)
Creatinine, Ser: 0.51 mg/dL (ref 0.44–1.00)
GFR calc Af Amer: >60 mL/min (ref >60)
GFR calc non Af Amer: >60 mL/min (ref >60)
Glucose, Bld: 194 mg/dL — ABNORMAL HIGH (ref 70–99)
Potassium: 3.5 mmol/L (ref 3.5–5.1)
Sodium: 136 mmol/L (ref 135–145)

## 2020-02-26 LAB — CBC
HCT: 29.9 % — ABNORMAL LOW (ref 36.0–46.0)
Hemoglobin: 10.2 g/dL — ABNORMAL LOW (ref 12.0–15.0)
MCH: 31.7 pg (ref 26.0–34.0)
MCHC: 34.1 g/dL (ref 30.0–36.0)
MCV: 92.9 fL (ref 80.0–100.0)
Platelets: 133 10*3/uL — ABNORMAL LOW (ref 150–400)
RBC: 3.22 MIL/uL — ABNORMAL LOW (ref 3.87–5.11)
RDW: 11.9 % (ref 11.5–15.5)
WBC: 4.7 10*3/uL (ref 4.0–10.5)
nRBC: 0 % (ref 0.0–0.2)

## 2020-02-26 MED ORDER — INSULIN GLARGINE 100 UNIT/ML ~~LOC~~ SOLN
35.0000 [IU] | Freq: Every day | SUBCUTANEOUS | Status: DC
  Administered 2020-02-26: 35 [IU] via SUBCUTANEOUS
  Filled 2020-02-26 (×2): qty 0.35

## 2020-02-26 MED ORDER — SODIUM CHLORIDE 0.9 % IV BOLUS
500.0000 mL | Freq: Once | INTRAVENOUS | Status: AC
  Administered 2020-02-26: 500 mL via INTRAVENOUS

## 2020-02-26 MED ORDER — ENOXAPARIN SODIUM 40 MG/0.4ML ~~LOC~~ SOLN
40.0000 mg | SUBCUTANEOUS | Status: DC
  Administered 2020-02-26 – 2020-02-27 (×2): 40 mg via SUBCUTANEOUS
  Filled 2020-02-26 (×2): qty 0.4

## 2020-02-26 MED ORDER — GLUCERNA SHAKE PO LIQD
237.0000 mL | ORAL | Status: DC
  Administered 2020-02-26: 237 mL via ORAL

## 2020-02-26 MED ORDER — INSULIN ASPART 100 UNIT/ML ~~LOC~~ SOLN
4.0000 [IU] | Freq: Three times a day (TID) | SUBCUTANEOUS | Status: DC
  Administered 2020-02-26 – 2020-02-27 (×4): 4 [IU] via SUBCUTANEOUS
  Filled 2020-02-26 (×4): qty 1

## 2020-02-26 NOTE — Progress Notes (Signed)
PROGRESS NOTE    [name] [name]  KNL:[id number] DOB: [birth year] DOA: 02/24/2020 PCP: Antony Blackbird, MD   Brief Narrative: Taken from H&P [name] [name] is a 59 y.o. female with medical history significant for autonomic dysfunction with recurrent syncopal/presyncopal episodes on midodrine and Florinef, as well as history of poorly controlled diabetes who presented to the emergency room following a fall which is not unusual for her, in which her knees buckled under her.  This time she fell to the ground injuring her right ankle and was unable to weight-bear on it.  X-ray of the ankle showed medial malleolus fracture on the right.  Also found to have hyperglycemia which improved with insulin intervention.  Initial plan was to discharge home with outpatient orthopedic evaluation.  Later she was taken to the OR by orthopedic this morning.  Subjective: Patient was complaining of right ankle pain.  Denies any dizziness while staying in bed.  Assessment & Plan:   Principal Problem:   Fracture of medial malleolus, right, closed Active Problems:   Recurrent syncope   Orthostatic hypotension   Hyperglycemia due to type 2 diabetes mellitus (HCC)   Autonomic dysfunction   Fall at home, initial encounter   Preoperative clearance  Fracture of medial malleolus, right, closed   Fall at home, initial encounter   Most likely mechanical as described by patient.  Orthostatic hypotension can be a possibility due to his history of autonomic dysfunction. - ORIF was done yesterday-tolerated the procedure well. -Postoperative PT evaluation. -Continue with pain management.  Orthostatic hypotension/ Autonomic dysfunction/ Recurrent syncope. Currently blood pressure within goal. -Continue home dose of Florinef and midodrine. -Continue thigh-high Ted hose.  Hyperglycemia due to type 2 diabetes mellitus ([place]) with neuropathy. Blood sugar 646 in the emergency room with normal anion gap, which initially improved with  insulin.  CBG remained elevated despite restarting home dose of Lantus. -Increase Lantus to 35 units at bedtime. -Continue supplemental insulin coverage -Check A1c-elevated at 14.8. -Continue home dose of Neurontin and Cymbalta.  Objective: Vitals:   02/26/20 0726 02/26/20 0945 02/26/20 1126 02/26/20 1528  BP: (!) 95/48 116/62 130/68 94/60  Pulse: 98  (!) 110 100  Resp: 20  20 18   Temp: 97.6 F (36.4 C)  98.5 F (36.9 C) 98.1 F (36.7 C)  TempSrc: Oral     SpO2: 98%  99% 100%  Weight:      Height:        Intake/Output Summary (Last 24 hours) at 02/26/2020 1533 Last data filed at 02/26/2020 0930 Gross per 24 hour  Intake 1219.97 ml  Output 1100 ml  Net 119.97 ml   Filed Weights   02/24/20 1734  Weight: 63.5 kg    Examination:  General.  Well-developed lady, in no acute distress. Pulmonary.  Lungs clear bilaterally, normal respiratory effort. CV.  Regular rate and rhythm, no JVD, rub or murmur. Abdomen.  Soft, nontender, nondistended, BS positive. CNS.  Alert and oriented x3.  No focal neurologic deficit. Extremities.  No edema, no cyanosis, pulses intact and symmetrical.  Right foot and leg in Ace wrap. Psychiatry.  Judgment and insight appears normal.  DVT prophylaxis:  Lovenox  Code Status: Full Family Communication: Discussed with patient Disposition Plan:  Status is: Inpatient  Remains inpatient appropriate because:Inpatient level of care appropriate due to severity of illness   Dispo: The patient is from: Home              Anticipated d/c is to: Home  Anticipated d/c date is: 1 day              Patient currently is not medically stable to d/c.  Consultants:   Orthopedic  Procedures:  Antimicrobials:   Data Reviewed: I have personally reviewed following labs and imaging studies  CBC: Recent Labs  Lab 02/24/20 1741 02/26/20 0507  WBC 6.4 4.7  HGB 12.6 10.2*  HCT 36.8 29.9*  MCV 91.5 92.9  PLT 184 161*   Basic Metabolic  Panel: Recent Labs  Lab 02/24/20 1741 02/26/20 0507  NA 128* 136  K 4.4 3.5  CL 96* 105  CO2 20* 26  GLUCOSE 646* 194*  BUN 9 6  CREATININE 0.70 0.51  CALCIUM 8.2* 8.1*   GFR: Estimated Creatinine Clearance: 70 mL/min (by C-G formula based on SCr of 0.51 mg/dL). Liver Function Tests: Recent Labs  Lab 02/24/20 1741  AST 32  ALT 32  ALKPHOS 123  BILITOT 0.8  PROT 6.4*  ALBUMIN 3.4*   No results for input(s): LIPASE, AMYLASE in the last 168 hours. No results for input(s): AMMONIA in the last 168 hours. Coagulation Profile: No results for input(s): INR, PROTIME in the last 168 hours. Cardiac Enzymes: No results for input(s): CKTOTAL, CKMB, CKMBINDEX, TROPONINI in the last 168 hours. BNP (last 3 results) No results for input(s): PROBNP in the last 8760 hours. HbA1C: Recent Labs    02/24/20 1741  HGBA1C 14.8*   CBG: Recent Labs  Lab 02/26/20 0031 02/26/20 0415 02/26/20 0736 02/26/20 0946 02/26/20 1135  GLUCAP 226* 171* 175* 259* 329*   Lipid Profile: No results for input(s): CHOL, HDL, LDLCALC, TRIG, CHOLHDL, LDLDIRECT in the last 72 hours. Thyroid Function Tests: No results for input(s): TSH, T4TOTAL, FREET4, T3FREE, THYROIDAB in the last 72 hours. Anemia Panel: No results for input(s): VITAMINB12, FOLATE, FERRITIN, TIBC, IRON, RETICCTPCT in the last 72 hours. Sepsis Labs: No results for input(s): PROCALCITON, LATICACIDVEN in the last 168 hours.  Recent Results (from the past 240 hour(s))  SARS Coronavirus 2 by RT PCR (hospital order, performed in [facility] hospital lab) Nasopharyngeal Nasopharyngeal Swab     Status: None   Collection Time: 02/25/20 12:07 AM   Specimen: Nasopharyngeal Swab  Result Value Ref Range Status   SARS Coronavirus 2 NEGATIVE NEGATIVE Final    Comment: (NOTE) SARS-CoV-2 target nucleic acids are NOT DETECTED.  The SARS-CoV-2 RNA is generally detectable in upper and lower respiratory specimens during the acute phase of  infection. The lowest concentration of SARS-CoV-2 viral copies this assay can detect is 250 copies / mL. A negative result does not preclude SARS-CoV-2 infection and should not be used as the sole basis for treatment or other patient management decisions.  A negative result may occur with improper specimen collection / handling, submission of specimen other than nasopharyngeal swab, presence of viral mutation(s) within the areas targeted by this assay, and inadequate number of viral copies (<250 copies / mL). A negative result must be combined with clinical observations, patient history, and epidemiological information.  Fact Sheet for Patients:   StrictlyIdeas.no  Fact Sheet for Healthcare Providers: BankingDealers.co.za  This test is not yet approved or  cleared by the [place] FDA and has been authorized for detection and/or diagnosis of SARS-CoV-2 by FDA under an Emergency Use Authorization (EUA).  This EUA will remain in effect (meaning this test can be used) for the duration of the COVID-19 declaration under Section 564(b)(1) of the Act, 21 U.S.C. section 360bbb-3(b)(1), unless the authorization  is terminated or revoked sooner.  Performed at [facility], [address], [place], [place] [address]      Radiology Studies: DG Tibia/Fibula Right  Result Date: 02/24/2020 CLINICAL DATA:  Status post fall. EXAM: RIGHT TIBIA AND FIBULA - 2 VIEW COMPARISON:  None. FINDINGS: Acute fracture deformities are seen involving the right medial malleolus and right posterior malleolus. An additional fracture is seen along the distal right fibula, just above the right lateral malleolus. There is no evidence of dislocation. Moderate severity diffuse soft tissue swelling is noted. IMPRESSION: Acute fractures of the right medial malleolus, right posterior malleolus and distal right fibula. Electronically Signed   By: Virgina Norfolk M.D.    On: 02/24/2020 19:03   DG Ankle Complete Right  Result Date: 02/24/2020 CLINICAL DATA:  Fall.  Ankle deformity EXAM: RIGHT ANKLE - COMPLETE 3+ VIEW COMPARISON:  None. FINDINGS: Fracture of the medial malleolus at the level of the talar dome. Slight medial subluxation of the tibia in relation to the talar dome. Fracture of the distal fibula at the metaphysis. Lateral angulation of the distal fibula. IMPRESSION: 1. Fracture the medial malleolus. 2. Mild medial subluxation at the tibiotalar joint. 3. Fracture of the distal fibula at the metaphysis. Electronically Signed   By: Suzy Bouchard M.D.   On: 02/24/2020 18:25   DG MINI C-ARM IMAGE ONLY  Result Date: 02/25/2020 There is no interpretation for this exam.  This order is for images obtained during a surgical procedure.  Please See "Surgeries" Tab for more information regarding the procedure.    Scheduled Meds: . cholecalciferol  5,000 Units Oral Daily  . DULoxetine  60 mg Oral BID  . fludrocortisone  0.1 mg Oral Daily  . gabapentin  600 mg Oral TID  . insulin aspart  0-9 Units Subcutaneous Q4H  . insulin aspart  4 Units Subcutaneous TID WC  . insulin glargine  35 Units Subcutaneous Q2200   Continuous Infusions: .  ceFAZolin (ANCEF) IV       LOS: 1 day   Time spent: 30 minutes.  Lorella Nimrod, MD [facility]  If 7PM-7AM, please contact night-coverage [email or web address]  02/26/2020, 3:33 PM   This record has been created using Systems analyst. Errors have been sought and corrected,but may not always be located. Such creation errors do not reflect on the standard of care.

## 2020-02-26 NOTE — Progress Notes (Addendum)
Inpatient Diabetes Program Recommendations  AACE/ADA: New Consensus Statement on Inpatient Glycemic Control   Target Ranges:  Prepandial:   less than 140 mg/dL      Peak postprandial:   less than 180 mg/dL (1-2 hours)      Critically ill patients:  140 - 180 mg/dL  Results for [name] [name], [name] [name] (MRN [id number]) as of 02/26/2020 10:51  Ref. Range 02/26/2020 00:31 02/26/2020 04:15 02/26/2020 07:36 02/26/2020 09:46  Glucose-Capillary Latest Ref Range: 70 - 99 mg/dL 226 (H) 171 (H) 175 (H) 259 (H)  Results for [name] [name], [name] [name] (MRN [id number]) as of 02/26/2020 10:51  Ref. Range 02/25/2020 08:25 02/25/2020 14:59 02/25/2020 17:00 02/25/2020 21:13  Glucose-Capillary Latest Ref Range: 70 - 99 mg/dL 271 (H) 201 (H) 203 (H) 186 (H)   Results for [name] [name], [name] [name] (MRN [id number]) as of 02/26/2020 10:51  Ref. Range 09/06/2019 15:00 02/24/2020 17:41  Hemoglobin A1C Latest Ref Range: 4.8 - 5.6 % 11.5 (H) 14.8 (H)   Review of Glycemic Control  Diabetes history: DM2 Outpatient Diabetes medications: Lantus 30 units daily at noon, Humalog 10 units TID with meals plus additional units for correction Current orders for Inpatient glycemic control: Lantus 30 units QHS, Novolog 0-9 units Q4H  Inpatient Diabetes Program Recommendations:    Insulin-Please consider increasing Lantus to 35 units QHS and adding Novolog 4 units TID with meals for meal coverage if patient eats at least 50% of meals.  HbgA1C: A1C 14.8% on 02/24/20 indicating an average glucose of 378 mg/dl over the past 2-3 months. Patient reports that glucose is consistently elevated outpatient. At time of discharge, MD may want to discharge on similar insulin regimen as used inpatient if glucose is fairly controlled.  Addendum 02/26/20@11 :50-Spoke with patient about diabetes and home regimen for diabetes control. Patient reports being followed by [facility] for diabetes management and currently taking Lantus 30 units daily at noon and Humalog 10 units TID with meals plus  additional units for correction if needed as an outpatient for diabetes control. Patient reports taking DM medications as prescribed.  Patient reports glucose is usually high in the 200-300's mg/dl and notes "HI" on glucometer at times.  Discussed A1C results (14.8% on 02/24/20) and explained that current A1C indicates an average glucose of 378 mg/dl over the past 2-3 months. Discussed glucose and A1C goals. Discussed importance of checking CBGs and maintaining good CBG control to prevent long-term and short-term complications. Explained how hyperglycemia leads to damage within blood vessels which lead to the common complications seen with uncontrolled diabetes. Stressed to the patient the importance of improving glycemic control to prevent further complications from uncontrolled diabetes.   Patient admits that she does not like diet sodas and she drinks mainly regular Coke or Sprite, [employer], or low Social worker.  Patient states that she would like to continue drinking regular Coke but she is willing to cut back on how much Coke she drinks. Discussed carbohydrates, carbohydrate goals per day and meal, along with portion sizes. Encouraged patient to cut back on regular sodas she is drinking to decrease carbohydrates. Patient also admits that sometimes she feels defeated because her glucose is up and down no matter if she does exactly as she is told with diet and medications. Discussed impact of nutrition, exercise, stress, sickness, and medications on diabetes control. Patient states that currently she does not take glucose readings with her to follow up appointments.  Encouraged patient to check glucose 4 times per day (before meals and at bedtime)  and to keep a log book of glucose readings and DM medication taken which patient will need to take to doctor appointments. Explained how the doctor can use the log book to continue to make adjustments with DM medications if needed. Encouraged patient to work with  providers at [facility]ness clinic to get DM under better control to decrease risk of complications. Stressed importance of glycemic control following surgery as well and encouraged patient to reach out to PCP if glucose is consistently staying over 180 mg/dl at home.  Patient verbalized understanding of information discussed and reports no further questions at this time related to diabetes.  Thanks, Barnie Alderman, RN, MSN, CDE Diabetes Coordinator Inpatient Diabetes Program [phone] (Team Pager from 8am to 5pm)

## 2020-02-26 NOTE — Anesthesia Postprocedure Evaluation (Signed)
Anesthesia Post Note  Patient: [name] [name]  Procedure(s) Performed: OPEN REDUCTION INTERNAL FIXATION (ORIF) ANKLE FRACTURE (Right Ankle)  Patient location during evaluation: PACU Anesthesia Type: General Level of consciousness: awake and alert Pain management: pain level controlled Vital Signs Assessment: post-procedure vital signs reviewed and stable Respiratory status: spontaneous breathing, nonlabored ventilation and respiratory function stable Cardiovascular status: blood pressure returned to baseline and stable Postop Assessment: no apparent nausea or vomiting Anesthetic complications: no   No complications documented.   Last Vitals:  Vitals:   02/25/20 1621 02/25/20 1813  BP: (!) 113/55 (!) 148/79  Pulse: (!) 109 (!) 109  Resp: 16 16  Temp: 36.6 C 36.6 C  SpO2: 100% 100%    Last Pain:  Vitals:   02/25/20 2100  TempSrc:   PainSc: [place]

## 2020-02-26 NOTE — Progress Notes (Signed)
Nutrition Brief Note  Patient identified on the Malnutrition Screening Tool (MST) Report.  59 year old female with PMH of T2DM, autonomic dysfunction with recurrent syncopal/presyncopal episodes on midodrine and Florinef. Pt presented to the emergency room following a fall sustaining right ankle fracture.  Spoke with pt via phone call to room. Pt reports good appetite and that she typically eats 3-4 meals daily PTA. Pt denies any recent changes in weight. She requests a Glucerna supplement in the evening before bed. RD to order.  Pt states that she had to order a new lunch tray due to difficulty swallowing. Pt states that her throat is swollen from her surgery because "they put something down my throat." Pt states that she tolerated liquids as well as mashed potatoes without difficulty.  Wt Readings from Last 15 Encounters:  02/24/20 63.5 kg  12/08/19 61.5 kg  09/18/19 62.8 kg  09/07/19 63.3 kg  08/09/19 62.8 kg  07/02/19 65.4 kg  06/29/19 61.7 kg  06/28/19 60.8 kg  05/29/19 60.6 kg  03/31/19 63 kg  03/15/19 63 kg  01/06/19 60.9 kg  10/04/18 58.1 kg  01/25/18 62.1 kg  11/01/17 64.1 kg    Body mass index is 22.6 kg/m. Patient meets criteria for normal weight based on current BMI.  Current diet order is Carb Modified, patient is consuming approximately 100% of meals at this time. Labs and medications reviewed.   No nutrition interventions warranted at this time. If nutrition issues arise, please consult RD.   Gaynell Face, MS, RD, LDN Inpatient Clinical Dietitian Please see AMiON for contact information.

## 2020-02-26 NOTE — Progress Notes (Signed)
Brief Pharmacy  Note  Patient started on Lovenox 40 mg SQ q24h for VTE prophylaxis.  Dorena Bodo, PharmD

## 2020-02-26 NOTE — Progress Notes (Signed)
Subjective:  Patient reports pain as moderate.    Objective:   VITALS:   Vitals:   02/26/20 0029 02/26/20 0412 02/26/20 0726 02/26/20 0945  BP: (!) 102/55 (!) 103/55 (!) 95/48 116/62  Pulse: (!) 126 (!) 109 98   Resp: 17 15 20    Temp: 98.7 F (37.1 C) 98 F (36.7 C) 97.6 F (36.4 C)   TempSrc: Oral Oral Oral   SpO2: 96% 96% 98%   Weight:      Height:        PHYSICAL EXAM:  Sensation intact distally Dorsiflexion/Plantar flexion intact Compartment soft Splint clean and dry  LABS  Results for orders placed or performed during the hospital encounter of 02/24/20 (from the past 24 hour(s))  Glucose, capillary     Status: Abnormal   Collection Time: 02/25/20  2:59 PM  Result Value Ref Range   Glucose-Capillary 201 (H) 70 - 99 mg/dL  Glucose, capillary     Status: Abnormal   Collection Time: 02/25/20  5:00 PM  Result Value Ref Range   Glucose-Capillary 203 (H) 70 - 99 mg/dL   Comment 1 Notify RN    Comment 2 Document in Chart   Glucose, capillary     Status: Abnormal   Collection Time: 02/25/20  9:13 PM  Result Value Ref Range   Glucose-Capillary 186 (H) 70 - 99 mg/dL   Comment 1 Notify RN   Glucose, capillary     Status: Abnormal   Collection Time: 02/26/20 12:31 AM  Result Value Ref Range   Glucose-Capillary 226 (H) 70 - 99 mg/dL   Comment 1 Notify RN   Glucose, capillary     Status: Abnormal   Collection Time: 02/26/20  4:15 AM  Result Value Ref Range   Glucose-Capillary 171 (H) 70 - 99 mg/dL   Comment 1 Notify RN   CBC     Status: Abnormal   Collection Time: 02/26/20  5:07 AM  Result Value Ref Range   WBC 4.7 4.0 - 10.5 K/uL   RBC 3.22 (L) 3.87 - 5.11 MIL/uL   Hemoglobin 10.2 (L) 12.0 - 15.0 g/dL   HCT 29.9 (L) 36 - 46 %   MCV 92.9 80.0 - 100.0 fL   MCH 31.7 26.0 - 34.0 pg   MCHC 34.1 30.0 - 36.0 g/dL   RDW 11.9 11.5 - 15.5 %   Platelets 133 (L) 150 - 400 K/uL   nRBC 0.0 0.0 - 0.2 %  Basic metabolic panel     Status: Abnormal   Collection Time:  02/26/20  5:07 AM  Result Value Ref Range   Sodium 136 135 - 145 mmol/L   Potassium 3.5 3.5 - 5.1 mmol/L   Chloride 105 98 - 111 mmol/L   CO2 26 22 - 32 mmol/L   Glucose, Bld 194 (H) 70 - 99 mg/dL   BUN 6 6 - 20 mg/dL   Creatinine, Ser 0.51 0.44 - 1.00 mg/dL   Calcium 8.1 (L) 8.9 - 10.3 mg/dL   GFR calc non Af Amer >60 >60 mL/min   GFR calc Af Amer >60 >60 mL/min   Anion gap 5 5 - 15  Glucose, capillary     Status: Abnormal   Collection Time: 02/26/20  7:36 AM  Result Value Ref Range   Glucose-Capillary 175 (H) 70 - 99 mg/dL  Glucose, capillary     Status: Abnormal   Collection Time: 02/26/20  9:46 AM  Result Value Ref Range   Glucose-Capillary 259 (H) 70 -  99 mg/dL    DG Tibia/Fibula Right  Result Date: 02/24/2020 CLINICAL DATA:  Status post fall. EXAM: RIGHT TIBIA AND FIBULA - 2 VIEW COMPARISON:  None. FINDINGS: Acute fracture deformities are seen involving the right medial malleolus and right posterior malleolus. An additional fracture is seen along the distal right fibula, just above the right lateral malleolus. There is no evidence of dislocation. Moderate severity diffuse soft tissue swelling is noted. IMPRESSION: Acute fractures of the right medial malleolus, right posterior malleolus and distal right fibula. Electronically Signed   By: Virgina Norfolk M.D.   On: 02/24/2020 19:03   DG Ankle Complete Right  Result Date: 02/24/2020 CLINICAL DATA:  Fall.  Ankle deformity EXAM: RIGHT ANKLE - COMPLETE 3+ VIEW COMPARISON:  None. FINDINGS: Fracture of the medial malleolus at the level of the talar dome. Slight medial subluxation of the tibia in relation to the talar dome. Fracture of the distal fibula at the metaphysis. Lateral angulation of the distal fibula. IMPRESSION: 1. Fracture the medial malleolus. 2. Mild medial subluxation at the tibiotalar joint. 3. Fracture of the distal fibula at the metaphysis. Electronically Signed   By: Suzy Bouchard M.D.   On: 02/24/2020 18:25   DG  MINI C-ARM IMAGE ONLY  Result Date: 02/25/2020 There is no interpretation for this exam.  This order is for images obtained during a surgical procedure.  Please See "Surgeries" Tab for more information regarding the procedure.    Assessment/Plan: 1 Day Post-Op   Principal Problem:   Fracture of medial malleolus, right, closed Active Problems:   Recurrent syncope   Orthostatic hypotension   Hyperglycemia due to type 2 diabetes mellitus ([place])   Autonomic dysfunction   Fall at home, initial encounter   Preoperative clearance   Advance diet Up with therapy  Discharge planning   Lovell Sheehan , MD 02/26/2020, 10:32 AM

## 2020-02-26 NOTE — Evaluation (Signed)
Occupational Therapy Evaluation Patient Details Name: [name] [name] MRN: [id number] DOB: 1961-07-07 Today's Date: 02/26/2020    History of Present Illness [name] [name] is a 59 y.o. female with medical history significant for autonomic dysfunction with recurrent syncopal/presyncopal episodes on midodrine and Florinef, as well as history of poorly controlled diabetes who presented to the emergency room following a fall which is not unusual for her, in which her knees buckled under her.  X-ray 02/24/20: Acute fractures of the right medial malleolus, right posterior malleolus and distal right fibula. S/p R ankle ORIF on 02/25/20   Clinical Impression   [name] [name] was seen for OT evaluation this date. Prior to hospital admission, pt was Independent c mobility and I/ADLs and reports history of falls stating "my legs just go out." Pt lives in [facility] c 7 other family members (son, DIL, FIL, MIL, grandchildren) and multiple dogs/cats which causes some of her falls. Pt reports none are available to assist her as her son/DIL work night shift. Pt presents to acute OT demonstrating impaired ADL performance and functional mobility 2/2 decreased activity tolerance, functional strength/balance deficits, and decreased safety awareness. Pt currently requires CGA + RW for SPT - maintained RLE NWBing pcns. Independent don L sock at bed level. VCs for RLE NWBing to bridge at bed level using LLE for simulated LBD. Pt would benefit from skilled OT to address noted impairments and functional limitations (see below for any additional details) in order to maximize safety and independence while minimizing falls risk and caregiver burden. Upon hospital discharge, recommend HHOT to maximize pt safety and return to functional independence during meaningful occupations of daily life.     Follow Up Recommendations  Home health OT;Other (comment) (SUPERVISION for Mobility)    Equipment Recommendations  3 in 1 bedside commode     Recommendations for Other Services       Precautions / Restrictions Precautions Precautions: Fall Restrictions Weight Bearing Restrictions: Yes RLE Weight Bearing: Non weight bearing      Mobility Bed Mobility Overal bed mobility: Modified Independent             General bed mobility comments: MOD I using BUE to assist RLE and HoB elevated  Transfers Overall transfer level: Needs assistance Equipment used: Rolling walker (2 wheeled) Transfers: Stand Pivot Transfers;Sit to/from Stand Sit to Stand: Min guard Stand pivot transfers: Min guard            Balance Overall balance assessment: Needs assistance Sitting-balance support: No upper extremity supported;Feet supported Sitting balance-Leahy Scale: Good     Standing balance support: Bilateral upper extremity supported Standing balance-Leahy Scale: Fair         ADL either performed or assessed with clinical judgement   ADL Overall ADL's : Needs assistance/impaired       General ADL Comments: Independent don L sock at bed level. VCs for RLE NWBing to bridge at bed level using LLE for simulated LBD. CGA + RW for SPT. Independent seated grooming tasks       Pertinent Vitals/Pain Pain Assessment: 0-10 Pain Score: 7  Pain Location: RLE Pain Descriptors / Indicators: Grimacing;Discomfort;Throbbing Pain Intervention(s): Limited activity within patient's tolerance;Monitored during session;Repositioned     Hand Dominance Right   Extremity/Trunk Assessment Upper Extremity Assessment Upper Extremity Assessment: Overall WFL for tasks assessed   Lower Extremity Assessment Lower Extremity Assessment: RLE deficits/detail RLE: Unable to fully assess due to pain;Unable to fully assess due to immobilization       Communication Communication Communication: No  difficulties   Cognition Arousal/Alertness: Awake/alert Behavior During Therapy: WFL for tasks assessed/performed Overall Cognitive Status: Within  Functional Limits for tasks assessed        General Comments       Exercises Exercises: Other exercises Other Exercises Other Exercises: Pt educated re: OT role, DME recs, d/c recs, falls prevention, RW technqiue, adapted dressing techniques, pet mgmt, importance of supervision for safety, home/routines modifications Other Exercises: LBD, grooming, sup>sit, sit<>stand, SPT, sitting/standing balance/tolerance   Shoulder Instructions      Home Living Family/patient expects to be discharged to:: Private residence Living Arrangements: Children;Other (Comment) (son, DIL, MIL, FIL, 3 grandchildren, 3 dogs, cats) Available Help at Discharge: Family;Available 24 hours/day;Available PRN/intermittently Type of Home: House Home Access: Stairs to enter [employer] of Steps: 3 Entrance Stairs-Rails: None Home Layout: One level     Bathroom Shower/Tub: Teacher, early years/pre: Standard         Additional Comments: Pt concerned over safety, 8 people in home that has 1 bathroom. The family has several dogs, cats and other animals that frequently cause pt to trip or feel unsteady. Dogs are kept in her room and family dismisses her concerns to not have pets in there. Son and DIL work nights and sleep during day, MIL/FIL would not be able to assist.       Prior Functioning/Environment Level of Independence: Independent        Comments: Pt reports hx of falls, reports legs give out. Independent c I/ADLs.        OT Problem List: Decreased activity tolerance;Impaired balance (sitting and/or standing);Decreased safety awareness;Decreased knowledge of use of DME or AE      OT Treatment/Interventions: Self-care/ADL training;Therapeutic exercise;Energy conservation;DME and/or AE instruction;Therapeutic activities;Patient/family education;Balance training    OT Goals(Current goals can be found in the care plan section) Acute Rehab OT Goals Patient Stated Goal: To go to  daughters home  OT Goal Formulation: With patient Time For Goal Achievement: 03/11/20 Potential to Achieve Goals: Good ADL Goals Pt Will Perform Grooming: with modified independence;sitting Pt Will Perform Lower Body Dressing: with modified independence;bed level Pt Will Transfer to Toilet: with modified independence;stand pivot transfer;bedside commode (c LRAD PRN)  OT Frequency: Min 1X/week   Barriers to D/C: Inaccessible home environment;Decreased caregiver support          Co-evaluation              AM-PAC OT "6 Clicks" Daily Activity     Outcome Measure Help from another person eating meals?: None Help from another person taking care of personal grooming?: None Help from another person toileting, which includes using toliet, bedpan, or urinal?: A Little Help from another person bathing (including washing, rinsing, drying)?: A Little Help from another person to put on and taking off regular upper body clothing?: None Help from another person to put on and taking off regular lower body clothing?: A Little 6 Click Score: 21   End of Session Equipment Utilized During Treatment: Gait belt;Rolling walker  Activity Tolerance: Patient tolerated treatment well Patient left: in chair;with call bell/phone within reach;with chair alarm set;with nursing/sitter in room (NT in room at end of session)  OT Visit Diagnosis: Other abnormalities of gait and mobility (R26.89);Repeated falls (R29.6)                Time: 3614-4315 OT Time Calculation (min): 34 min Charges:  OT General Charges $OT Visit: 1 Visit OT Evaluation $OT Eval Moderate Complexity: 1 Mod OT Treatments $  Self Care/Home Management : 23-37 mins  Dessie Coma, M.S. OTR/L  02/26/20, 12:48 PM  ascom [phone]

## 2020-02-26 NOTE — Evaluation (Signed)
Physical Therapy Evaluation Patient Details Name: [name] [name] MRN: [id number] DOB: 1960-11-06 Today's Date: 02/26/2020   History of Present Illness  [name] [name] is a 59 y.o. female with medical history significant for autonomic dysfunction with recurrent syncopal/presyncopal episodes on midodrine and Florinef, as well as history of poorly controlled diabetes who presented to the emergency room following a fall which is not unusual for her, in which her knees buckled under her.  X-ray 02/24/20: Acute fractures of the right medial malleolus, right posterior malleolus and distal right fibula. S/p R ankle ORIF on 02/25/20  Clinical Impression  Pt received in recliner chair upon arrival to room. Pt performed pre-gait activities to check for maintenance of NWB status on RLE. Once pt noted to be able to clear LLE while NWB on RLE, pt ambulated 2 feet utilizing RW with min A for balance. Pt with noted BUE and LLE tremors secondary to fatigue and required seated rest break. Further ambulation distance not attempted secondary to safety concerns. Pt with difficulty elevating RLE secondary to muscle weakness and increased weight of bandaging/splinting material. Pt requires increased time to perform all tasks and denied dizziness with positional changes. Pt performed sit <> stand and stand pivot transfers with RW and CGA for balance with verbal cues for sequencing. Pt currently presents with decreased strength, mobility, endurance, and balance. PT will follow this acute stay to address current deficits and decreased falls risk. Recommend STR at discharge from hospital stay due to decreased ambulation distance and activity tolerance at this time to maximize functional mobility and return to PLOF. It pt unable to go to STR and d/c's home, will need manual wheelchair for mobility.  Vitals: Pre in sitting: BP LUE 109/65 During in standing: BP LUE 109/56 Post in sitting: BP LUE 97/57        Follow Up Recommendations  SNF    Equipment Recommendations  Rolling walker with 5" wheels;3in1 (PT);Wheelchair (measurements PT);Wheelchair cushion (measurements PT) (wheelchair if d/c home)    Recommendations for Other Services       Precautions / Restrictions Precautions Precautions: Fall Restrictions Weight Bearing Restrictions: Yes RLE Weight Bearing: Non weight bearing      Mobility  Bed Mobility Overal bed mobility: Needs Assistance Bed Mobility: Sit to Supine       Sit to supine: Min assist   General bed mobility comments: min A for RLE elevation onto bed secondary to fatigue and heaviness of bandaging  Transfers Overall transfer level: Needs assistance Equipment used: Rolling walker (2 wheeled) Transfers: Sit to/from [facility] Sit to Stand: Min guard Stand pivot transfers: Min guard       General transfer comment: CGA for sit <> stand transfers and stand pivot transfers for balance and safety; increased verbal cues for sequencing and hand/foot placement  Ambulation/Gait Ambulation/Gait assistance: Min assist Gait Distance (Feet): 2 Feet Assistive device: Rolling walker (2 wheeled)   Gait velocity: decreased   General Gait Details: Pt ambulated 2 feet forward with NWB on RLE and min A for steadying. Verbal cues for sequencing and min A for walker management. Pt fatigued and noted tremors in BUE and LLE from fatigue resulting in requiring seated rest break. Deferred further ambulation distance at this time due to safety concerns.  Stairs            Wheelchair Mobility    Modified Rankin (Stroke Patients Only)       Balance Overall balance assessment: Needs assistance Sitting-balance support: Feet supported Sitting balance-Leahy Scale:  Good Sitting balance - Comments: pt able to maintain sitting balance without back support with no difficulty   Standing balance support: Bilateral upper extremity supported Standing balance-Leahy Scale: Fair Standing  balance comment: pt requires min A for static standing balance; noted slight posterior LOB requiring min A for steadying                             Pertinent Vitals/Pain Pain Assessment: Faces Faces Pain Scale: Hurts even more Pain Location: RLE Pain Descriptors / Indicators: Grimacing;Discomfort Pain Intervention(s): Limited activity within patient's tolerance;Monitored during session;Repositioned    Home Living Family/patient expects to be discharged to:: Private residence Living Arrangements: Children;Other (Comment) Available Help at Discharge: Family;Available PRN/intermittently Type of Home: House Home Access: Stairs to enter Entrance Stairs-Rails: None Entrance Stairs-Number of Steps: 3 Home Layout: One level Home Equipment: Walker - 4 wheels Additional Comments: Pt worried about discharging to home setting with multiple pets and trip hazards in the home. Despite living with multiple people, she reports that she will have intermittent assist.     Prior Function Level of Independence: Independent         Comments: Pt endorses mutliple fall history and states that both of her legs give out, R worse than the L. She also states that she passes out occasionally due to low blood pressure and has had this since grade school.     Hand Dominance   Dominant Hand: Right    Extremity/Trunk Assessment   Upper Extremity Assessment Upper Extremity Assessment: Generalized weakness (grossly 3+ to 4-/5 bilat; sh. flex limited to 100 deg bilat)    Lower Extremity Assessment Lower Extremity Assessment: Generalized weakness;RLE deficits/detail (3+ to 4-/5 LLE) RLE Deficits / Details: unable to complete SLR due to increased weight of bandaging/splinting, limited motion during heel slide (elevated by clinician) RLE: Unable to fully assess due to pain;Unable to fully assess due to immobilization       Communication   Communication: No difficulties  Cognition  Arousal/Alertness: Awake/alert Behavior During Therapy: WFL for tasks assessed/performed Overall Cognitive Status: Within Functional Limits for tasks assessed                                 General Comments: Pt A and O x 4 this afternoon.      General Comments      Exercises Other Exercises Other Exercises: pt performed LLE controlled "hopping" in place with BUE on RW while maintaining NWB on RLE for pre-gait activity; min A for steadying   Assessment/Plan    PT Assessment Patient needs continued PT services  PT Problem List Decreased strength;Decreased range of motion;Decreased activity tolerance;Decreased balance;Decreased mobility;Decreased knowledge of use of DME;Pain       PT Treatment Interventions DME instruction;Gait training;Stair training;Functional mobility training;Therapeutic activities;Therapeutic exercise;Balance training;Patient/family education    PT Goals (Current goals can be found in the Care Plan section)  Acute Rehab PT Goals Patient Stated Goal: To go to daughters home  PT Goal Formulation: With patient Time For Goal Achievement: 03/11/20 Potential to Achieve Goals: Good Additional Goals Additional Goal #1: Pt will perform sit <> stand transfers and stand pivot transfers from bed/chair level using RW, maintaining NWB on RLE, with no more than supervision. (to maximize functional mobility)    Frequency BID   Barriers to discharge Decreased caregiver support      Co-evaluation  AM-PAC PT "6 Clicks" Mobility  Outcome Measure Help needed turning from your back to your side while in a flat bed without using bedrails?: A Little Help needed moving from lying on your back to sitting on the side of a flat bed without using bedrails?: A Little Help needed moving to and from a bed to a chair (including a wheelchair)?: A Little Help needed standing up from a chair using your arms (e.g., wheelchair or bedside chair)?: A  Little Help needed to walk in hospital room?: A Little Help needed climbing 3-5 steps with a railing? : A Lot 6 Click Score: 17    End of Session Equipment Utilized During Treatment: Gait belt Activity Tolerance: Patient limited by fatigue Patient left: in bed;with call bell/phone within reach;with bed alarm set;with SCD's reapplied (LLE SCD only) Nurse Communication: Mobility status;Weight bearing status PT Visit Diagnosis: Unsteadiness on feet (R26.81);Repeated falls (R29.6);Muscle weakness (generalized) (M62.81);History of falling (Z91.81);Difficulty in walking, not elsewhere classified (R26.2);Pain Pain - Right/Left: Right Pain - part of body: Leg;Ankle and joints of foot    Time: 7121-9758 PT Time Calculation (min) (ACUTE ONLY): 46 min   Charges:             Vale Haven, SPT  Vale Haven 02/26/2020, 5:32 PM

## 2020-02-27 ENCOUNTER — Ambulatory Visit: Payer: Medicaid Other | Admitting: Physical Therapy

## 2020-02-27 DIAGNOSIS — R739 Hyperglycemia, unspecified: Secondary | ICD-10-CM

## 2020-02-27 DIAGNOSIS — I951 Orthostatic hypotension: Secondary | ICD-10-CM

## 2020-02-27 DIAGNOSIS — Z01818 Encounter for other preprocedural examination: Secondary | ICD-10-CM

## 2020-02-27 DIAGNOSIS — E860 Dehydration: Secondary | ICD-10-CM

## 2020-02-27 LAB — GLUCOSE, CAPILLARY
Glucose-Capillary: 164 mg/dL — ABNORMAL HIGH (ref 70–99)
Glucose-Capillary: 203 mg/dL — ABNORMAL HIGH (ref 70–99)
Glucose-Capillary: 217 mg/dL — ABNORMAL HIGH (ref 70–99)
Glucose-Capillary: 243 mg/dL — ABNORMAL HIGH (ref 70–99)
Glucose-Capillary: 245 mg/dL — ABNORMAL HIGH (ref 70–99)

## 2020-02-27 MED ORDER — GLUCERNA SHAKE PO LIQD: 237.0000 mL | ORAL | 0 refills | Status: DC

## 2020-02-27 NOTE — Progress Notes (Signed)
Inpatient Diabetes Program Recommendations  AACE/ADA: New Consensus Statement on Inpatient Glycemic Control  Target Ranges:  Prepandial:   less than 140 mg/dL      Peak postprandial:   less than 180 mg/dL (1-2 hours)      Critically ill patients:  140 - 180 mg/dL   Results for [name] [name], [name] [name] (MRN [id number]) as of 02/27/2020 11:19  Ref. Range 02/26/2020 07:36 02/26/2020 09:46 02/26/2020 11:35 02/26/2020 16:34 02/26/2020 20:34 02/27/2020 00:47 02/27/2020 04:17 02/27/2020 07:36  Glucose-Capillary Latest Ref Range: 70 - 99 mg/dL 175 (H)   259 (H)  Novolog 5 units 329 (H)  Novolog 7 units 247 (H)  Novolog 7 units 246 (H)  Novolog 3 units  Lantus 35 units 217 (H)  Novolog 3 units 203 (H)  Novolog 3 units 164 (H)  Novolog 6 units  Results for [name] [name], [name] [name] (MRN [id number]) as of 02/27/2020 11:19  Ref. Range 02/24/2020 17:41  Hemoglobin A1C Latest Ref Range: 4.8 - 5.6 % 14.8 (H)   Review of Glycemic Control  history: DM2 Outpatient Diabetes medications: Lantus 30 units daily at noon, Humalog 10 units TID with meals plus additional units for correction Current orders for Inpatient glycemic control: Lantus 35 units QHS, Novolog 0-9 units Q4H, Novolog 4 units TID with meals for meal coverage  Inpatient Diabetes Program Recommendations:    Insulin-Please consider increasing Lantus to 40 units QHS and meal coverage to Novolog 6 units TID with meals if patient eats at least 50% of meals.  HbgA1C: A1C 14.8% on 02/24/20 indicating an average glucose of 378 mg/dl over the past 2-3 months. Patient reports that glucose is consistently elevated outpatient. At time of discharge, MD may want to discharge on similar insulin regimen as used inpatient if glucose is fairly controlled.  Thanks, Barnie Alderman, RN, MSN, CDE Diabetes Coordinator Inpatient Diabetes Program [phone] (Team Pager from 8am to 5pm)

## 2020-02-27 NOTE — Progress Notes (Signed)
Physical Therapy Treatment Patient Details Name: [name] [name] MRN: [id number] DOB: 02-21-1961 Today's Date: 02/27/2020    History of Present Illness [name] [name] is a 59 y.o. female with medical history significant for autonomic dysfunction with recurrent syncopal/presyncopal episodes on midodrine and Florinef, as well as history of poorly controlled diabetes who presented to the emergency room following a fall which is not unusual for her, in which her knees buckled under her.  X-ray 02/24/20: Acute fractures of the right medial malleolus, right posterior malleolus and distal right fibula. S/p R ankle ORIF on 02/25/20    PT Comments    Pt lying in bed upon arrival to room and agreeable to PT session this morning. Pt required demonstration of ambulation with RW while maintaining NWB on RLE. Pt then ambulated 8 feet using RW and min A for steadying. Pt fatigued and requested to sit down stating that she felt weak. Pt able to maintain NWB status for ambulation distance. Pt then performed therex seated in recliner chair for promotion of BLE strengthening to include ability to perform R hip flexion to maintain NWB with standing activity. Pt required mod A to perform RLE SLR secondary to muscle weakness and pain. Pt limited overall due to decreased functional activity tolerance and pain and requires increased time to perform all activities. Continue to recommend STR due to pt's limited ambulation distance, activity tolerance, and overall generalized weakness. If pt is to d/c home, pt will need HHPT and wheelchair for safe mobility.  BP in LUE Semi-supine: 121/77 Sitting before activity: 95/59 Standing: 88/50 Sitting after activity: 128/62 End of session:  119/106    Follow Up Recommendations  SNF     Equipment Recommendations  Rolling walker with 5" wheels;3in1 (PT);Wheelchair (measurements PT);Wheelchair cushion (measurements PT)    Recommendations for Other Services       Precautions /  Restrictions Precautions Precautions: Fall Restrictions Weight Bearing Restrictions: Yes RLE Weight Bearing: Non weight bearing    Mobility  Bed Mobility Overal bed mobility: Modified Independent Bed Mobility: Supine to Sit     Supine to sit: Modified independent (Device/Increase time)     General bed mobility comments: Pt able to sit EOB without physical assistance and denies dizziness with change in position. Mod I for increased time to perform task.   Transfers Overall transfer level: Needs assistance Equipment used: Rolling walker (2 wheeled) Transfers: Sit to/from Stand Sit to Stand: Min guard         General transfer comment: CGA for steadying during sit <> stand trasnfers. Verbal cues for hand placement and hip placement prior to standing.  Ambulation/Gait Ambulation/Gait assistance: Min assist Gait Distance (Feet): 8 Feet Assistive device: Rolling walker (2 wheeled)   Gait velocity: decreased   General Gait Details: Pt ambulated 8 feet using RW with min A for balance and safety. Pt requires increased time to perform task and reports weakness and the need to sit down. Pt remained NWB on RLE.   Stairs             Wheelchair Mobility    Modified Rankin (Stroke Patients Only)       Balance Overall balance assessment: Needs assistance Sitting-balance support: Feet supported Sitting balance-Leahy Scale: Good Sitting balance - Comments: no overt LOB in sitting; noted forward rounded shoulders/posture due to feeling bad   Standing balance support: Bilateral upper extremity supported Standing balance-Leahy Scale: Fair Standing balance comment: min A for static standing balance with BUE support on RW  Cognition Arousal/Alertness: Awake/alert Behavior During Therapy: WFL for tasks assessed/performed Overall Cognitive Status: Within Functional Limits for tasks assessed                                         Exercises Other Exercises Other Exercises: SLR x 10 on RLW with mod A, RLE LAQ x 10, LLE heel slides x 10; pt attempted RLE heel slides (heel elevated by clinician) however pt with increase in pain and deferred    General Comments        Pertinent Vitals/Pain Pain Assessment: Faces Faces Pain Scale: Hurts little more Pain Location: RLE and generalized sensation of not feeling well Pain Descriptors / Indicators: Grimacing;Discomfort;Aching Pain Intervention(s): Monitored during session;Limited activity within patient's tolerance;Repositioned    Home Living                      Prior Function            PT Goals (current goals can now be found in the care plan section) Acute Rehab PT Goals Patient Stated Goal: To go to daughters home  PT Goal Formulation: With patient Time For Goal Achievement: 03/11/20 Potential to Achieve Goals: Good Progress towards PT goals: Progressing toward goals    Frequency    BID      PT Plan Current plan remains appropriate    Co-evaluation              AM-PAC PT "6 Clicks" Mobility   Outcome Measure  Help needed turning from your back to your side while in a flat bed without using bedrails?: A Little Help needed moving from lying on your back to sitting on the side of a flat bed without using bedrails?: A Little Help needed moving to and from a bed to a chair (including a wheelchair)?: A Little Help needed standing up from a chair using your arms (e.g., wheelchair or bedside chair)?: A Little Help needed to walk in hospital room?: A Little Help needed climbing 3-5 steps with a railing? : A Lot 6 Click Score: 17    End of Session Equipment Utilized During Treatment: Gait belt Activity Tolerance: Patient limited by fatigue;Patient limited by pain Patient left: in chair;with call bell/phone within reach;with chair alarm set;with SCD's reapplied (LLE SCD only) Nurse Communication: Mobility status;Weight bearing  status PT Visit Diagnosis: Unsteadiness on feet (R26.81);Repeated falls (R29.6);Muscle weakness (generalized) (M62.81);History of falling (Z91.81);Difficulty in walking, not elsewhere classified (R26.2);Pain Pain - Right/Left: Right Pain - part of body: Leg;Ankle and joints of foot     Time: 0920-0958 PT Time Calculation (min) (ACUTE ONLY): 38 min  Charges:  $Gait Training: 8-22 mins $Therapeutic Exercise: 23-37 mins                     Vale Haven, SPT   Arretta Toenjes 02/27/2020, 1:22 PM

## 2020-02-27 NOTE — Progress Notes (Signed)
DISCHARGE NOTE:  Pt given discharge note, pt verbalized understanding. Pts wheelchair, [facility] and belongings sent with pt. Pt wheeled to the car by staff. Son providing transportation.

## 2020-02-27 NOTE — Discharge Summary (Signed)
Physician Discharge Summary  [name] [name] OIZ:[id number] DOB: 04/11/1961 DOA: 02/24/2020  PCP: Antony Blackbird, MD  Admit date: 02/24/2020 Discharge date: 02/27/2020  Admitted From: Home Disposition:Home    Recommendations for Outpatient Follow-up:  1. Follow up with PCP in 1-2 weeks 2. Please obtain BMP/CBC in one week 3. Please follow up on the following pending results:None  Home Health:Yes Equipment/Devices: Wheelchair, 3 in 1 Discharge Condition: Stable CODE STATUS: Full Diet recommendation: Heart Healthy / Carb Modified   Brief/Interim Summary: [name] [name] a 59 y.o.femalewith medical history significant forautonomic dysfunction with recurrent syncopal/presyncopal episodes on midodrine and Florinef, as well as history of poorly controlled diabetes who presented to the emergency room following a fall which is not unusual for her, in which her knees buckled under her. This time she fell to the ground injuring her right ankle and was unable to weight-bear on it. X-ray of the ankle showed medial malleolus fracture on the right.  Also found to have hyperglycemia which improved with insulin intervention.  Initial plan was to discharge home with outpatient orthopedic evaluation.  Later she was taken to the OR by orthopedic for ORIF.  Patient tolerated the procedure well.  Physical therapist are recommending SNF placement, unable to get a place for her as patient is uninsured.  We arranged wheelchair and 3 and 1 from charitable sources along with some home PT and OT.  Patient will follow up with orthopedic surgery in 1 to 2 weeks.  Patient has a longstanding history of autonomic dysfunction most likely secondary to uncontrolled diabetes.  She was instructed to be mindful and use walker to prevent further falls.  She fractured her ankle with falling at ground-level, that can make her osteoporotic by definition.  She was advised to follow-up with her primary care provider and get a DEXA scan.  She  might get benefit with bisphosphonate after recovering from this acute injury.  Patient has very uncontrolled diabetes, A1c of 14.8.  She was advised to have a close follow-up with her primary care provider for better management of her diabetes.  She will continue with her home meds.   Discharge Diagnoses:  Principal Problem:   Fracture of medial malleolus, right, closed Active Problems:   Recurrent syncope   Orthostatic hypotension   Hyperglycemia due to type 2 diabetes mellitus (HCC)   Autonomic dysfunction   Fall   Preoperative clearance   Dehydration   Hyperglycemia  Discharge Instructions  Discharge Instructions    Diet - low sodium heart healthy   Complete by: As directed    Discharge instructions   Complete by: As directed    It was pleasure taking care of you. Please avoid moving quickly to prevent any future falls.  You can use walker to help. You are being provided with wheelchair to use while recovering from this fracture. Please discuss with your primary care provider regarding the need of a medication to strengthen your bone and to get a DEXA scan in the future. Follow-up with orthopedic. Follow-up with your primary care provider.   Increase activity slowly   Complete by: As directed    Leave dressing on - Keep it clean, dry, and intact until clinic visit   Complete by: As directed      Allergies as of 02/27/2020      Reactions   Tramadol Nausea And Vomiting   REACTION: Projectile vomiting      Medication List    TAKE these medications   acetaminophen 500 MG tablet Commonly  known as: TYLENOL Take 2 tablets (1,000 mg total) by mouth every 6 (six) hours as needed for mild pain or headache (pain).   diclofenac Sodium 1 % Gel Commonly known as: Voltaren Apply 1 application topically 4 (four) times daily.   DULoxetine 60 MG capsule Commonly known as: CYMBALTA TAKE 1 CAPSULE (60 MG TOTAL) BY MOUTH 2 (TWO) TIMES DAILY.   feeding supplement (GLUCERNA  SHAKE) Liqd Take 237 mLs by mouth daily.   fludrocortisone 0.1 MG tablet Commonly known as: FLORINEF TAKE 1 TABLET (0.1 MG TOTAL) BY MOUTH DAILY.   gabapentin 600 MG tablet Commonly known as: NEURONTIN TAKE ONE TABLET (600 MG) BY MOUTH EVERY MORNING AND AFTERNOON, TAKE TWO TABLETS AT NIGHT   Glucosamine Sulfate 1000 MG Caps Take 1 capsule (1,000 mg total) by mouth 2 (two) times daily.   glucose blood test strip [employer] Testing Strips Use as instructed   HumaLOG KwikPen 100 UNIT/ML KwikPen Generic drug: insulin lispro Inject 0.1 mLs (10 Units total) into the skin 3 (three) times daily. What changed:   when to take this  additional instructions   Lantus SoloStar 100 UNIT/ML Solostar Pen Generic drug: insulin glargine Inject 30 Units into the skin daily.   meloxicam 15 MG tablet Commonly known as: MOBIC Take 0.5-1 tablets (7.5-15 mg total) by mouth daily as needed for pain.   midodrine 10 MG tablet Commonly known as: PROAMATINE Take 1 tablet (10 mg total) by mouth 2 (two) times daily with a meal.   Pen Needles 31G X 8 MM Misc Use as directed What changed: Another medication with the same name was removed. Continue taking this medication, and follow the directions you see here.   Turmeric 500 MG Caps Take 500 mg by mouth 2 (two) times daily.   Vitamin D-3 125 MCG (5000 UT) Tabs Take 1 tablet by mouth daily.            Durable Medical Equipment  (From admission, onward)         Start     Ordered   02/27/20 1010  For home use only DME 3 n 1  Once        02/27/20 1009   02/27/20 1007  For home use only DME lightweight manual wheelchair with seat cushion  Once       Comments: Patient suffers from ankle fracture which impairs their ability to perform daily activities like bathing, dressing, feeding in the home.  A  will not help with her ADl's. issue with performing activities of daily living. A wheelchair will allow patient to safely perform daily activities.  Patient is not able to propel themselves in the home using a standard weight wheelchair due to endurance. Patient can self propel in the lightweight wheelchair. Length of need 6 months . Accessories: elevating leg rests (ELRs), wheel locks, extensions and anti-tippers.   02/27/20 1009           Discharge Care Instructions  (From admission, onward)         Start     Ordered   02/27/20 0000  Leave dressing on - Keep it clean, dry, and intact until clinic visit        02/27/20 1217          Follow-up Information    Carlynn Spry, PA-C Follow up.   Specialty: Orthopedic Surgery Why: 10-14 days Contact information: [place] [address] [phone]        Antony Blackbird, MD. Schedule an  appointment as soon as possible for a visit.   Specialty: Family Medicine Contact information: [place] [address] [phone]        Lorretta Harp, MD .   Specialties: Cardiology, Radiology Contact information: [address] [place] [address] [phone]              Allergies  Allergen Reactions  . Tramadol Nausea And Vomiting    REACTION: Projectile vomiting    Consultations:  Orthopedic  Procedures/Studies: DG Tibia/Fibula Right  Result Date: 02/24/2020 CLINICAL DATA:  Status post fall. EXAM: RIGHT TIBIA AND FIBULA - 2 VIEW COMPARISON:  None. FINDINGS: Acute fracture deformities are seen involving the right medial malleolus and right posterior malleolus. An additional fracture is seen along the distal right fibula, just above the right lateral malleolus. There is no evidence of dislocation. Moderate severity diffuse soft tissue swelling is noted. IMPRESSION: Acute fractures of the right medial malleolus, right posterior malleolus and distal right fibula. Electronically Signed   By: Virgina Norfolk M.D.   On: 02/24/2020 19:03   DG Ankle Complete Right  Result Date: 02/24/2020 CLINICAL DATA:  Fall.   Ankle deformity EXAM: RIGHT ANKLE - COMPLETE 3+ VIEW COMPARISON:  None. FINDINGS: Fracture of the medial malleolus at the level of the talar dome. Slight medial subluxation of the tibia in relation to the talar dome. Fracture of the distal fibula at the metaphysis. Lateral angulation of the distal fibula. IMPRESSION: 1. Fracture the medial malleolus. 2. Mild medial subluxation at the tibiotalar joint. 3. Fracture of the distal fibula at the metaphysis. Electronically Signed   By: Suzy Bouchard M.D.   On: 02/24/2020 18:25   DG MINI C-ARM IMAGE ONLY  Result Date: 02/25/2020 There is no interpretation for this exam.  This order is for images obtained during a surgical procedure.  Please See "Surgeries" Tab for more information regarding the procedure.     Subjective: Patient was complaining of some generalized malaise and right ankle pain when seen today.  Discharge Exam: Vitals:   02/27/20 0123 02/27/20 0745  BP: 133/75 95/61  Pulse: (!) 116 95  Resp: 18 20  Temp: 98.7 F (37.1 C) 98.3 F (36.8 C)  SpO2: 98% 97%   Vitals:   02/26/20 1126 02/26/20 1528 02/27/20 0123 02/27/20 0745  BP: 130/68 94/60 133/75 95/61  Pulse: (!) 110 100 (!) 116 95  Resp: 20 18 18 20   Temp: 98.5 F (36.9 C) 98.1 F (36.7 C) 98.7 F (37.1 C) 98.3 F (36.8 C)  TempSrc:  Oral Oral Oral  SpO2: 99% 100% 98% 97%  Weight:      Height:        General: Pt is alert, awake, not in acute distress Cardiovascular: RRR, S1/S2 +, no rubs, no gallops Respiratory: CTA bilaterally, no wheezing, no rhonchi Abdominal: Soft, NT, ND, bowel sounds + Extremities: no edema, no cyanosis.  Right foot with Ace wrap.   The results of significant diagnostics from this hospitalization (including imaging, microbiology, ancillary and laboratory) are listed below for reference.    Microbiology: Recent Results (from the past 240 hour(s))  SARS Coronavirus 2 by RT PCR (hospital order, performed in [facility] hospital lab)  Nasopharyngeal Nasopharyngeal Swab     Status: None   Collection Time: 02/25/20 12:07 AM   Specimen: Nasopharyngeal Swab  Result Value Ref Range Status   SARS Coronavirus 2 NEGATIVE NEGATIVE Final    Comment: (NOTE) SARS-CoV-2 target nucleic acids are NOT  DETECTED.  The SARS-CoV-2 RNA is generally detectable in upper and lower respiratory specimens during the acute phase of infection. The lowest concentration of SARS-CoV-2 viral copies this assay can detect is 250 copies / mL. A negative result does not preclude SARS-CoV-2 infection and should not be used as the sole basis for treatment or other patient management decisions.  A negative result may occur with improper specimen collection / handling, submission of specimen other than nasopharyngeal swab, presence of viral mutation(s) within the areas targeted by this assay, and inadequate number of viral copies (<250 copies / mL). A negative result must be combined with clinical observations, patient history, and epidemiological information.  Fact Sheet for Patients:   StrictlyIdeas.no  Fact Sheet for Healthcare Providers: BankingDealers.co.za  This test is not yet approved or  cleared by the [place] FDA and has been authorized for detection and/or diagnosis of SARS-CoV-2 by FDA under an Emergency Use Authorization (EUA).  This EUA will remain in effect (meaning this test can be used) for the duration of the COVID-19 declaration under Section 564(b)(1) of the Act, 21 U.S.C. section 360bbb-3(b)(1), unless the authorization is terminated or revoked sooner.  Performed at [facility], [address] [place], [place] [address]      Labs: BNP (last 3 results) No results for input(s): BNP in the last 8760 hours. Basic Metabolic Panel: Recent Labs  Lab 02/24/20 1741 02/26/20 0507  NA 128* 136  K 4.4 3.5  CL 96* 105  CO2 20* 26  GLUCOSE 646* 194*  BUN 9 6   CREATININE 0.70 0.51  CALCIUM 8.2* 8.1*   Liver Function Tests: Recent Labs  Lab 02/24/20 1741  AST 32  ALT 32  ALKPHOS 123  BILITOT 0.8  PROT 6.4*  ALBUMIN 3.4*   No results for input(s): LIPASE, AMYLASE in the last 168 hours. No results for input(s): AMMONIA in the last 168 hours. CBC: Recent Labs  Lab 02/24/20 1741 02/26/20 0507  WBC 6.4 4.7  HGB 12.6 10.2*  HCT 36.8 29.9*  MCV 91.5 92.9  PLT 184 133*   Cardiac Enzymes: No results for input(s): CKTOTAL, CKMB, CKMBINDEX, TROPONINI in the last 168 hours. BNP: Invalid input(s): POCBNP CBG: Recent Labs  Lab 02/26/20 2034 02/27/20 0047 02/27/20 0417 02/27/20 0736 02/27/20 1130  GLUCAP 246* 217* 203* 164* 245*   D-Dimer No results for input(s): DDIMER in the last 72 hours. Hgb A1c Recent Labs    02/24/20 1741  HGBA1C 14.8*   Lipid Profile No results for input(s): CHOL, HDL, LDLCALC, TRIG, CHOLHDL, LDLDIRECT in the last 72 hours. Thyroid function studies No results for input(s): TSH, T4TOTAL, T3FREE, THYROIDAB in the last 72 hours.  Invalid input(s): FREET3 Anemia work up No results for input(s): VITAMINB12, FOLATE, FERRITIN, TIBC, IRON, RETICCTPCT in the last 72 hours. Urinalysis    Component Value Date/Time   COLORURINE STRAW (A) 12/08/2019 1125   APPEARANCEUR CLEAR (A) 12/08/2019 1125   APPEARANCEUR Clear 03/15/2019 1056   LABSPEC 1.016 12/08/2019 1125   PHURINE 6.0 12/08/2019 1125   GLUCOSEU >=500 (A) 12/08/2019 1125   HGBUR NEGATIVE 12/08/2019 1125   BILIRUBINUR NEGATIVE 12/08/2019 1125   BILIRUBINUR negative 08/09/2019 1720   BILIRUBINUR Negative 03/15/2019 1056   KETONESUR 5 (A) 12/08/2019 1125   PROTEINUR NEGATIVE 12/08/2019 1125   UROBILINOGEN 0.2 08/09/2019 1720   UROBILINOGEN 0.2 11/12/2013 1755   NITRITE NEGATIVE 12/08/2019 1125   LEUKOCYTESUR NEGATIVE 12/08/2019 1125   Sepsis Labs Invalid input(s): PROCALCITONIN,  WBC,  [place] Microbiology Recent Results (from the past  240 hour(s))  SARS Coronavirus 2 by RT PCR (hospital order, performed in [facility] hospital lab) Nasopharyngeal Nasopharyngeal Swab     Status: None   Collection Time: 02/25/20 12:07 AM   Specimen: Nasopharyngeal Swab  Result Value Ref Range Status   SARS Coronavirus 2 NEGATIVE NEGATIVE Final    Comment: (NOTE) SARS-CoV-2 target nucleic acids are NOT DETECTED.  The SARS-CoV-2 RNA is generally detectable in upper and lower respiratory specimens during the acute phase of infection. The lowest concentration of SARS-CoV-2 viral copies this assay can detect is 250 copies / mL. A negative result does not preclude SARS-CoV-2 infection and should not be used as the sole basis for treatment or other patient management decisions.  A negative result may occur with improper specimen collection / handling, submission of specimen other than nasopharyngeal swab, presence of viral mutation(s) within the areas targeted by this assay, and inadequate number of viral copies (<250 copies / mL). A negative result must be combined with clinical observations, patient history, and epidemiological information.  Fact Sheet for Patients:   StrictlyIdeas.no  Fact Sheet for Healthcare Providers: BankingDealers.co.za  This test is not yet approved or  cleared by the [place] FDA and has been authorized for detection and/or diagnosis of SARS-CoV-2 by FDA under an Emergency Use Authorization (EUA).  This EUA will remain in effect (meaning this test can be used) for the duration of the COVID-19 declaration under Section 564(b)(1) of the Act, 21 U.S.C. section 360bbb-3(b)(1), unless the authorization is terminated or revoked sooner.  Performed at [facility], [address] [place], [place] [address]     Time coordinating discharge: Over 30 minutes  SIGNED:  Lorella Nimrod, MD  [facility] 02/27/2020, 12:19 PM  If 7PM-7AM, please  contact night-coverage [email or web address]  This record has been created using Systems analyst. Errors have been sought and corrected,but may not always be located. Such creation errors do not reflect on the standard of care.

## 2020-02-27 NOTE — TOC Initial Note (Signed)
Transition of Care [facility]) - Initial/Assessment Note    Patient Details  Name: [name] [name] MRN: [id number] Date of Birth: 1960/07/29  [facility] CM/SW Contact:    Shelbie Ammons, RN Phone Number: 02/27/2020, 3:12 PM  Clinical Narrative:   RNCM assessed patient at bedside, she was getting ready to work with therapy. Discussed CM role and that we would discuss discharge planning. Patient initially reported that she was unsure of whether she would be going to her son or daughters home but eventually decided she would go to her sons where she came from. She is agreeable to a wheel chair and 3N1, charity equipment through [employer] and she is agreeable to home health if charity can be arranged.  RNCM reached out to [facility] with [facility] and he will provide charity equipment. RNCM reached out to [place] with [facility] for a charity referral but she is unable to accept at this time she says due to capacity. RNCM reached out to [place] with [facility] and he did accept for home health PT.            Expected Discharge Plan: [place] Barriers to Discharge: No Barriers Identified   Patient Goals and CMS Choice        Expected Discharge Plan and Services Expected Discharge Plan: [place]   Discharge Planning Services: CM Consult Post Acute Care Choice: [place] arrangements for the past 2 months: Single Family Home Expected Discharge Date: 02/27/20               DME Arranged: Wheelchair manual, 3-N-1 DME Agency: [facility] Date DME Agency Contacted: 02/27/20 Time DME Agency Contacted: 69 Representative spoke with at DME Agency: [place]: PT [place]: [place] ([place]) Date [place]: 02/27/20 Time [place]: 1330 Representative spoke with at [place]: Corene Cornea  Prior Living Arrangements/Services Living arrangements for the past 2 months: [place] with:: Adult Children, Minor  Children Patient language and need for interpreter reviewed:: Yes Do you feel safe going back to the place where you live?: Yes      Need for Family Participation in Patient Care: Yes (Comment) Care giver support system in place?: Yes (comment)   Criminal Activity/Legal Involvement Pertinent to Current Situation/Hospitalization: No - Comment as needed  Activities of Daily Living Home Assistive Devices/Equipment: Eyeglasses, Crutches ADL Screening (condition at time of admission) Patient's cognitive ability adequate to safely complete daily activities?: Yes Is the patient deaf or have difficulty hearing?: No Does the patient have difficulty seeing, even when wearing glasses/contacts?: No (reading glasses) Does the patient have difficulty concentrating, remembering, or making decisions?: Yes Patient able to express need for assistance with ADLs?: Yes Does the patient have difficulty dressing or bathing?: Yes Independently performs ADLs?: No Communication: Independent Dressing (OT): Needs assistance Is this a change from baseline?: Change from baseline, expected to last <3days Grooming: Needs assistance Is this a change from baseline?: Change from baseline, expected to last <3 days Feeding: Independent Bathing: Needs assistance Is this a change from baseline?: Change from baseline, expected to last <3 days Toileting: Needs assistance Is this a change from baseline?: Change from baseline, expected to last <3 days In/Out Bed: Needs assistance Is this a change from baseline?: Change from baseline, expected to last <3 days Walks in Home: Independent with device (comment) Does the patient have difficulty walking or climbing stairs?: Yes Weakness of Legs: Both Weakness of Arms/Hands: Both  Permission Sought/Granted                  Emotional Assessment Appearance:: Appears stated age Attitude/Demeanor/Rapport: Engaged Affect (typically observed): Appropriate Orientation: :  Oriented to Self, Oriented to Place, Oriented to  Time, Oriented to Situation Alcohol / Substance Use: Not Applicable Psych Involvement: No (comment)  Admission diagnosis:  Orthostatic hypotension [I95.1] Dehydration [E86.0] Hyperglycemia [R73.9] Fall, initial encounter [W19.XXXA] Patient Active Problem List   Diagnosis Date Noted  . Dehydration   . Hyperglycemia   . Preoperative clearance 02/25/2020  . Fracture of medial malleolus, right, closed 02/24/2020  . Hyperglycemia due to type 2 diabetes mellitus ([place]) 02/24/2020  . Autonomic dysfunction 02/24/2020  . Fall 02/24/2020  . Adrenal insufficiency ([place]) 09/07/2019  . Recurrent syncope 09/06/2019  . Sinus tachycardia 09/06/2019  . Hypokalemia 09/06/2019  . Pulmonary nodules 09/06/2019  . Orthostatic hypotension   . Syncope and collapse 06/30/2019  . Hypotension 06/29/2019  . Chronic pain syndrome 08/24/2017  . Diabetic polyneuropathy associated with type 2 diabetes mellitus ([place]) 08/24/2017  . Restless leg syndrome 08/24/2017  . Vertigo 04/09/2016  . Acute sinusitis 06/27/2015  . Cervical disc disorder with radiculopathy of cervical region 02/20/2015  . Neck muscle spasm 10/23/2014  . Healthcare maintenance 10/23/2014  . Depression 11/25/2013  . Diabetes type 2, uncontrolled ([place])    PCP:  Antony Blackbird, MD Pharmacy:   [facility], [address] [place] [address] Phone: [phone] Fax: [phone]     Social Determinants of Health (SDOH) Interventions    Readmission Risk Interventions No flowsheet data found.

## 2020-02-28 ENCOUNTER — Telehealth: Payer: Self-pay

## 2020-02-28 NOTE — Telephone Encounter (Signed)
Transition Care Management Follow-up Telephone Call Date of discharge and from where: 02/27/2020, [facility]  Call placed to patient # [phone], message left with call back requested to this CM # [phone]  She has a follow up appointment scheduled at [facility] 8/20/210

## 2020-02-29 ENCOUNTER — Ambulatory Visit: Payer: Medicaid Other | Admitting: Physical Therapy

## 2020-03-01 ENCOUNTER — Telehealth: Payer: Self-pay | Admitting: Family Medicine

## 2020-03-01 ENCOUNTER — Telehealth: Payer: Self-pay

## 2020-03-01 NOTE — Telephone Encounter (Signed)
Ok to give verbal order.

## 2020-03-01 NOTE — Telephone Encounter (Signed)
Transition Care Management Follow-up Telephone Call Date of discharge and from where: 02/27/2020, [facility]   Any questions or concerns? None  Items Reviewed: Did the pt receive and understand the discharge instructions provided? YES Medications obtained and verified? YES  Any new allergies since your discharge? NONE Dietary orders reviewed? Yes  Do you have support at home? Daughter and family   Functional Questionnaire: (I = Independent and D = Dependent) ADLs: I with assistance when necessary  Follow up appointments reviewed:  PCP Hospital f/u appt confirmed?  Scheduled to see Dr Wynetta Emery  On  8/20/210 Specialist Hospital f/u appt confirmed?  Yes ortho and [facility] Are transportation arrangements needed? NO  If their condition worsens, /is the pt aware to call PCP or go to the Emergency Dept.?  Pt is aware if condition is worsening or start experiencing any of diff breathing, SOB, chest pain, extreme fatigue, Persistent nausea and vomiting, bleeding , rapid weight gain, severe uncontrolled pain, or visual disturbances to return to ED  Was the patient provided with contact information for the PCP's office or ED? YES given.  Was to pt encouraged to call back with questions or concerns?YES name and contact information .  DME pt was discharged with [facility] and Wheelchair

## 2020-03-01 NOTE — Telephone Encounter (Signed)
Please advise.  Copied from [place] [id number]. Topic: General - Other >> Mar 01, 2020 11:18 AM Rain[place] A wrote: Gerald Stabs PT Called to get Verbal orders for PT 2w2 and 1w2 and home health aid  Best contact [phone]

## 2020-03-04 ENCOUNTER — Encounter: Payer: Medicaid Other | Admitting: Physical Therapy

## 2020-03-05 ENCOUNTER — Telehealth: Payer: Self-pay | Admitting: Family Medicine

## 2020-03-05 NOTE — Telephone Encounter (Signed)
Returned call to [place] at [facility] and gave her the verbal orders.

## 2020-03-05 NOTE — Telephone Encounter (Signed)
Okay to give verbal orders?  ?

## 2020-03-05 NOTE — Telephone Encounter (Signed)
Jeani Hawking With [facility] calling for verbal home health PT orders 1 wk 1 2 wk 2 1 wk 2  Home health aid 2 wk 4  cb: [phone]   option 2

## 2020-03-06 ENCOUNTER — Encounter: Payer: Medicaid Other | Admitting: Physical Therapy

## 2020-03-06 NOTE — Progress Notes (Deleted)
Cardiology Office Note:    Date:  03/06/2020   ID:  [name] [name], DOB 1960-10-03, MRN [id number]  PCP:  Antony Blackbird, MD  Cardiologist:  Quay Burow, MD  Electrophysiologist:  None   Referring MD: Antony Blackbird, MD   Chief Complaint: hospital follow-up after fall and ankle fracture  History of Present Illness:    [name] [name] is a 59 y.o. 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 Gwenlyn Found and presents today for hospital follow-up for fall with ankle fracture.   Patient first seen by Dr. Gwenlyn Found in 06/2019 for further evaluation of symptomatic hypotension with orthostatic component. Echo showed LVEF of 65% with normal wall motion and no significant valvular disease. 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 She was advised to increase salt intake and then was ultimately started on Florinef 0.1mg  daily. Patient was hospitalized in 08/2019 after presenting with syncope. She was noted to be hypoglycemic and had markedly positive orthostatic vitals which were felt to be the culprit of her syncope. She was started on Midodrine in addition to home Florinef. She was seen by Roby Lofts, PA-C, in 09/2019 for follow-up at which time she was doing well since discharge. She did note occasional lightheadedness though less than before her admission. BP and and blood sugars had been stable at home.   She was recently admitted again from 02/24/2020 to 02/27/2020 for right medial malleolus fracture after a fall when her knees buckled under her. She was also found to be hyperglycemic on arrival with glucose of 646 on CMP. She was treated insulin with improvement in hyperglycemia. She underwent ORIF of ankle fracture on 02/25/2020 and tolerated the procedure well. PT recommend SNF placement at discharge but they were unable  to get a place for her due to the fact that she is uninsured. She was discharged with with wheelchair and 3 and 1 from charitable sources along with home PT and OT.   Patient presents today for hospital follow-up. ***  Orthostatic Hypotension with Recurrent Syncope - *** - Continue Florinef 0.1mg  daily and Midodrine 10mg  twice daily. ***  Poorly Controlled Type 2 Diabetes Mellitus - Recent hemoglobin A1c 14.8% on 02/24/2020.  - Diabetic neuropathy may be contributed to orthostatic hypotension. *** - Managed by PCP. ***   Past Medical History:  Diagnosis Date  . Cervical disc disorder with radiculopathy of cervical region   . Chronic low back pain    HNP  . Chronic pain syndrome   . Degenerative joint disease   . Depression dx 1997  . Diabetes mellitus, type 2 ([place]) 2011   No insulin  . Diabetic polyneuropathy ([place])   . GERD (gastroesophageal reflux disease)   . Glaucoma   . Headache(784.0)   . Herniated disc   . Hypertension    Lab 11/2011:  CXR, EKG, CBC, TSH, BMet, troponin-normal; lipid profile: 188, 131, 36, 126   . Lumbar herniated disc   . Non-compliance   . Pancreatic cyst    Endoscopic aspiration in 09/2009  . Pneumonia 08/02/2012  . Shingles   . 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 Torn meniscus   . Vertigo     Past Surgical History:  Procedure Laterality Date  . ABDOMINAL HYSTERECTOMY    . CESAREAN SECTION    . ORIF ANKLE FRACTURE  Right 02/25/2020   Procedure: OPEN REDUCTION INTERNAL FIXATION (ORIF) ANKLE FRACTURE;  Surgeon: Lovell Sheehan, MD;  Location: [facility] ORS;  Service: Orthopedics;  Laterality: Right;    Current Medications: No outpatient medications have been marked as taking for the 03/18/20 encounter (Appointment) with Darreld Mclean, PA-C.     Allergies:   Tramadol   Social History   Socioeconomic History  . Marital status: Single    Spouse name: Not on file  . Number of children: Not on file  . Years of education: Not on  file  . Highest education level: Not on file  Occupational History  . Not on file  Tobacco Use  . Smoking status: Former Smoker    Quit date: 08/03/1978    Years since quitting: 41.6  . Smokeless tobacco: Never Used  . Tobacco comment: Smoked age [age]  Substance and Sexual Activity  . Alcohol use: No    Comment: Former  . Drug use: No  . Sexual activity: Yes    Birth control/protection: Surgical  Other Topics Concern  . 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    Social Determinants of Health   Financial Resource Strain:   . Difficulty of Paying Living Expenses:   Food Insecurity:   . Worried About Charity fundraiser in the Last Year:   . Arboriculturist in the Last Year:   Transportation Needs:   . Film/video editor (Medical):   Marland Kitchen Lack of Transportation (Non-Medical):   Physical Activity:   . Days of Exercise per Week:   . Minutes of Exercise per Session:   Stress:   . Feeling of Stress :   Social Connections:   . Frequency of Communication with Friends and Family:   . Frequency of Social Gatherings with Friends and Family:   . Attends Religious Services:   . Active Member of Clubs or Organizations:   . Attends Archivist Meetings:   Marland Kitchen Marital Status:      Family History: The patient's ***family history includes Depression in her mother; Diabetes in her mother; Heart attack in her brother; Heart failure in her maternal grandmother; Lung cancer in her father; Renal Disease in her mother.  ROS:   Please see the history of present illness.    *** All other systems reviewed and are negative.  EKGs/Labs/Other Studies Reviewed:    The following studies were reviewed today:  Echocardiogram 07/01/2019: Impressions: 1. Left ventricular ejection fraction, by visual estimation, is 60 to  65%. The left ventricle has normal function. There is no left ventricular  hypertrophy.  2. Indeterminate diastolic  filling due to E-A fusion.  3. The left ventricle has no regional wall motion abnormalities.  4. Global right ventricle has normal systolic function.The right  ventricular size is normal. No increase in right ventricular wall  thickness.  5. Left atrial size was normal.  6. Right atrial size was normal.  7. The pericardial effusion is circumferential.  8. Trivial pericardial effusion is present.  9. The mitral valve is normal in structure. No evidence of mitral valve  regurgitation. No evidence of mitral stenosis.  10. The tricuspid valve is normal in structure. Tricuspid valve  regurgitation is not demonstrated.  11. The aortic valve is tricuspid. Aortic valve regurgitation is not  visualized. No evidence of aortic valve sclerosis or stenosis.  12. The pulmonic valve was not well visualized. Pulmonic  valve  regurgitation is not visualized.   EKG:  EKG *** ordered today. EKG personally reviewed and demonstrates ***.  Recent Labs: 09/05/2019: TSH 0.794 12/10/2019: Magnesium 1.8 02/24/2020: ALT 32 02/26/2020: BUN 6; Creatinine, Ser 0.51; Hemoglobin 10.2; Platelets 133; Potassium 3.5; Sodium 136  Recent Lipid Panel    Component Value Date/Time   CHOL 197 03/15/2019 1056   TRIG 260 (H) 03/15/2019 1056   HDL 36 (L) 03/15/2019 1056   CHOLHDL 5.5 (H) 03/15/2019 1056   CHOLHDL 7.6 04/10/2016 0714   VLDL 65 (H) 04/10/2016 0714   LDLCALC 109 (H) 03/15/2019 1056    Physical Exam:    Vital Signs: There were no vitals taken for this visit.    Wt Readings from Last 3 Encounters:  02/24/20 140 lb (63.5 kg)  12/08/19 135 lb 9.3 oz (61.5 kg)  09/18/19 138 lb 6.4 oz (62.8 kg)     General: 59 y.o. female in no acute distress. HEENT: Normocephalic and atraumatic. Sclera clear. EOMs intact. Neck: Supple. No carotid bruits. No JVD. Heart: *** RRR. Distinct S1 and S2. No murmurs, gallops, or rubs. Radial and distal pedal pulses 2+ and equal bilaterally. Lungs: No increased work of  breathing. Clear to ausculation bilaterally. No wheezes, rhonchi, or rales.  Abdomen: Soft, non-distended, and non-tender to palpation. Bowel sounds present in all 4 quadrants.  MSK: Normal strength and tone for age. *** Extremities: No lower extremity edema.    Skin: Warm and dry. Neuro: Alert and oriented x3. No focal deficits. Psych: Normal affect. Responds appropriately.   Assessment:    No diagnosis found.  Plan:     Disposition: Follow up in ***   Medication Adjustments/Labs and Tests Ordered: Current medicines are reviewed at length with the patient today.  Concerns regarding medicines are outlined above.  No orders of the defined types were placed in this encounter.  No orders of the defined types were placed in this encounter.   There are no Patient Instructions on file for this visit.   Signed, Darreld Mclean, PA-C  03/06/2020 1:28 PM    [facility]

## 2020-03-07 MED FILL — DULoxetine HCL 60 MG CPEP: 60 | 30 days supply | Qty: 60 | Fill #1

## 2020-03-08 ENCOUNTER — Other Ambulatory Visit: Payer: Self-pay

## 2020-03-08 ENCOUNTER — Ambulatory Visit: Payer: Medicaid Other | Attending: Internal Medicine | Admitting: Internal Medicine

## 2020-03-08 DIAGNOSIS — E1143 Type 2 diabetes mellitus with diabetic autonomic (poly)neuropathy: Secondary | ICD-10-CM

## 2020-03-08 DIAGNOSIS — Z794 Long term (current) use of insulin: Secondary | ICD-10-CM

## 2020-03-08 DIAGNOSIS — S8254XA Nondisplaced fracture of medial malleolus of right tibia, initial encounter for closed fracture: Secondary | ICD-10-CM

## 2020-03-08 DIAGNOSIS — D649 Anemia, unspecified: Secondary | ICD-10-CM

## 2020-03-08 DIAGNOSIS — E1165 Type 2 diabetes mellitus with hyperglycemia: Secondary | ICD-10-CM

## 2020-03-08 DIAGNOSIS — R296 Repeated falls: Secondary | ICD-10-CM

## 2020-03-08 DIAGNOSIS — Z09 Encounter for follow-up examination after completed treatment for conditions other than malignant neoplasm: Secondary | ICD-10-CM

## 2020-03-08 MED ORDER — HUMALOG KWIKPEN 100 UNIT/ML ~~LOC~~ SOPN: 18.0000 [IU] | PEN_INJECTOR | Freq: Three times a day (TID) | SUBCUTANEOUS | 99 refills | Status: DC

## 2020-03-08 MED ORDER — LANTUS SOLOSTAR 100 UNIT/ML ~~LOC~~ SOPN: 45.0000 [IU] | PEN_INJECTOR | Freq: Every day | SUBCUTANEOUS | 99 refills | Status: DC

## 2020-03-08 NOTE — Progress Notes (Signed)
Virtual Visit via Telephone Note Due to current restrictions/limitations of in-office visits due to the COVID-19 pandemic, this scheduled clinical appointment was converted to a telehealth visit  I connected with [name] [name] on 03/08/20 at 2:21 p.m by telephone and verified that I am speaking with the correct person using two identifiers. I am in my office.  The patient is at home.  Only the patient and myself participated in this encounter.  I discussed the limitations, risks, security and privacy concerns of performing an evaluation and management service by telephone and the availability of in person appointments. I also discussed with the patient that there may be a patient responsible charge related to this service. The patient expressed understanding and agreed to proceed.   History of Present Illness: Patient with history of autonomic dysfunction with recurrent syncopal/presyncopal episodes on midodrine and Florinef, DM type II, diabetic polyneuropathy, adrenal insufficiency, restless leg syndrome, chronic pain multiple joints, pulmonary nodules.  PCP is Dr. Chapman Fitch.  Purpose of today's visit is transition of care.  Patient hospitalized 8/7-04/2020. Date of call from case manager: 03/01/2020.  Patient hospitalized with fall at home with resultant fracture of the right medial malleolus.  She was taken to the operating room and underwent ORIF.  SNF was recommended but patient was uninsured so she was discharged home with a wheelchair and home physical therapy/Occupational Therapy.  Patient sustained this fracture with falling at ground level.  She may be osteoporotic.  Recommended that she get bone density study and consider bisphosphonate therapy after recovery from this fracture. A1c during the hospitalization was 14.8.  Patient was discharged home on Lantus and Humalog.  Today: Patient reports that she is doing okay.  She has an appointment with the orthopedic Dr. Exie Parody on this coming Monday.   She started home physical therapy and has had 2 sessions so far.  In regards to her diabetes.  Sh, she tells me that she checks her blood sugars about 4 times a day and that her numbers vary.  Blood sugars before breakfast 180-200, before lunch around 110, before dinner 210-220 -reports compliance with Lantus.  She tells me she is taking 40 units daily and Humalog SS 10-15 units -daughter-in-law cooks.  She tries to be mindful of her DM -suppose to be on Midodrine and Florinef but states she is on on Florinef.  Last BP yesterday taken by P.T was 118/70.  -referred to endocrine by Dr. Chapman Fitch earlier this yr.  Pt was not seen.  She does OC.    Observations/Objective: Lab Results  Component Value Date   WBC 4.7 02/26/2020   HGB 10.2 (L) 02/26/2020   HCT 29.9 (L) 02/26/2020   MCV 92.9 02/26/2020   PLT 133 (L) 02/26/2020     Chemistry      Component Value Date/Time   NA 136 02/26/2020 0507   NA 135 08/09/2019 1715   K 3.5 02/26/2020 0507   CL 105 02/26/2020 0507   CO2 26 02/26/2020 0507   BUN 6 02/26/2020 0507   BUN 8 08/09/2019 1715   CREATININE 0.51 02/26/2020 0507   CREATININE 0.74 02/20/2015 1259      Component Value Date/Time   CALCIUM 8.1 (L) 02/26/2020 0507   ALKPHOS 123 02/24/2020 1741   AST 32 02/24/2020 1741   ALT 32 02/24/2020 1741   BILITOT 0.8 02/24/2020 1741   BILITOT 0.3 06/28/2019 1225       Assessment and Plan: 1. Hospital discharge follow-up   2. Closed nondisplaced fracture of  medial malleolus of right tibia, initial encounter Followed by orthopedics.  She has a follow-up visit with the orthopedics on Monday.  She is currently receiving home physical therapy. -We will order a DEXA scan once her fracture has healed and consider putting her on a bisphosphonate if indeed this was a fragility fracture.  3. Type 2 diabetes mellitus with diabetic autonomic neuropathy, with long-term current use of insulin (HCC) Blood sugars not at goal.  Stressed the importance  of good diabetes control to help prevent further complications from diabetes.  I recommend that she increase Lantus insulin from 40 units daily to 45 units daily and Humalog insulin to 18 units with meals.  Continue to monitor blood sugars.  Follow-up with the clinical pharmacist in 2 weeks with blood sugar log for further adjustment of insulin. - CBC; Future - Basic metabolic panel; Future - Ambulatory referral to Endocrinology  4. Normocytic anemia - CBC; Future  5. Recurrent falls  Follow Up Instructions: 2 wks with clinical pharmacist for BS review and adjustment of insulin Pt to stop at lab at that visit for blood test ordered today F/u with me or PCP in 7 wks   I discussed the assessment and treatment plan with the patient. The patient was provided an opportunity to ask questions and all were answered. The patient agreed with the plan and demonstrated an understanding of the instructions.   The patient was advised to call back or seek an in-person evaluation if the symptoms worsen or if the condition fails to improve as anticipated.  I provided 14 minutes of non-face-to-face time during this encounter.   Karle Plumber, MD

## 2020-03-11 MED FILL — ?BASAGLAR 100 UNITS/ML KWPE: 100 | 33 days supply | Qty: 15 | Fill #0

## 2020-03-11 MED FILL — ?HUMALOG 100 UNITS/ML KWIKP: 100 | 27 days supply | Qty: 15 | Fill #0

## 2020-03-11 MED FILL — GABAPENTIN 600 MG TABLET: 600 | 30 days supply | Qty: 120 | Fill #1

## 2020-03-12 ENCOUNTER — Encounter: Payer: Medicaid Other | Admitting: Physical Therapy

## 2020-03-12 ENCOUNTER — Ambulatory Visit: Payer: Self-pay | Admitting: Physical Therapy

## 2020-03-18 ENCOUNTER — Ambulatory Visit: Payer: Medicaid Other | Admitting: Student

## 2020-03-20 ENCOUNTER — Encounter: Payer: Medicaid Other | Admitting: Physical Therapy

## 2020-03-22 ENCOUNTER — Ambulatory Visit: Payer: Medicaid Other | Admitting: Pharmacist

## 2020-03-22 NOTE — Progress Notes (Unsigned)
A1C = 14.8 (02/24/20) - up from 11.5 in 09/06/19 Check Clinic BG When diagnosed with DM? Review medications and adherence (timing of meds, etc.)  -  Meds before? When DM diagnosed? Ate or drank anything prior to visit today? At home BGs?  Marland Kitchen Highs . Lows  Hyperglycemia sx (nocturia, neuropathy, visual changes, foot exams) Hypoglycemia symptoms (dizziness, shaky, sweating, hungry, confusion) Diet Exercise Flu vaccine?  Need labs today      S:    PCP: Dr. Chapman Fitch  Patient arrives ***.  Presents for diabetes evaluation, education, and management.  Patient was referred on 03/08/20 by Dr. Wynetta Emery.  Patient was last seen by Primary Care Provider on 09/20/19.   Today, ***. Patient was recently hospitalized 8/7-8/10/21 following a hypotensive episode and fall requiring surgery for her ankle fracture. She was discharged on Lantus and Humalog.   Patient reports Diabetes was diagnosed in ***.   Family/Social History:  Smoking: former smoker FHx: Mother - diabetes, brother - MI  Insurance coverage/medication affordability: *** Medicaid (only family planning)  Medication adherence reported *** .   Current diabetes medications include: *** Current hypertension medications include: none Current hyperlipidemia medications include: none  Patient {Actions; denies-reports:120008} hypoglycemic events.  Patient reported dietary habits: Eats *** meals/day Breakfast:*** Lunch:*** Dinner:*** Snacks:*** Drinks:***  Patient-reported exercise habits: ***   Patient {Actions; denies-reports:120008} nocturia (nighttime urination).  Patient {Actions; denies-reports:120008} neuropathy (nerve pain). Patient {Actions; denies-reports:120008} visual changes. Patient {Actions; denies-reports:120008} self foot exams.     O:  POCT BG ***    Lab Results  Component Value Date   HGBA1C 14.8 (H) 02/24/2020   There were no vitals filed for this visit.  Lipid Panel     Component Value Date/Time    CHOL 197 03/15/2019 1056   TRIG 260 (H) 03/15/2019 1056   HDL 36 (L) 03/15/2019 1056   CHOLHDL 5.5 (H) 03/15/2019 1056   CHOLHDL 7.6 04/10/2016 0714   VLDL 65 (H) 04/10/2016 0714   LDLCALC 109 (H) 03/15/2019 1056    Home fasting blood sugars: ***  2 hour post-meal/random blood sugars: ***.   Clinical Atherosclerotic Cardiovascular Disease (ASCVD): No  The 10-year ASCVD risk score Mikey Bussing DC Jr., et al., 2013) is: 8.7%   Values used to calculate the score:     Age: 59 years     Sex: Female     Is Non-Hispanic African American: No     Diabetic: Yes     Tobacco smoker: No     Systolic Blood Pressure: 259 mmHg     Is BP treated: No     HDL Cholesterol: 36 mg/dL     Total Cholesterol: 197 mg/dL    A/P: Diabetes longstanding*** currently uncontrolled with most recent A1c 14.8 (02/24/20). Patient is *** able to verbalize appropriate hypoglycemia management plan. Medication adherence appears ***. Control is suboptimal due to ***. -{Meds adjust:18428} basal insulin *** (insulin ***). Patient will continue to titrate 1 unit every *** days if fasting blood sugar > 100mg /dl until fasting blood sugars reach goal or next visit.  -{Meds adjust:18428}  rapid insulin *** (insulin ***) to ***.  -{Meds adjust:18428} GLP-1 *** (generic name***) to ***.  -{Meds adjust:18428} SGLT2-I *** (generic name***) to ***. Counseled on sick day rules for ***. -Extensively discussed pathophysiology of diabetes, recommended lifestyle interventions, dietary effects on blood sugar control -Counseled on s/sx of and management of hypoglycemia -Next A1C anticipated ***.   ASCVD risk - primary***secondary prevention in patient with diabetes. Last LDL {Is/is not:9024} controlled.  ASCVD risk score {Is/is not:9024} >20%  - {Desc; low/moderate/high:110033} intensity statin indicated. Aspirin {Is/is not:9024} indicated.  -{Meds adjust:18428} aspirin *** mg  -{Meds adjust:18428} ***statin *** mg.    Written patient  instructions provided.  Total time in face to face counseling *** minutes.   Follow up Pharmacist/PCP*** Clinic Visit in ***.

## 2020-03-24 ENCOUNTER — Emergency Department
Admission: EM | Admit: 2020-03-24 | Discharge: 2020-03-24 | Disposition: A | Discharge status: Home / Self Care | Payer: Self-pay | Attending: Emergency Medicine | Admitting: Emergency Medicine

## 2020-03-24 ENCOUNTER — Emergency Department: Payer: Self-pay

## 2020-03-24 ENCOUNTER — Encounter: Payer: Self-pay | Admitting: Emergency Medicine

## 2020-03-24 ENCOUNTER — Other Ambulatory Visit: Payer: Self-pay

## 2020-03-24 DIAGNOSIS — Z79899 Other long term (current) drug therapy: Secondary | ICD-10-CM | POA: Insufficient documentation

## 2020-03-24 DIAGNOSIS — I959 Hypotension, unspecified: Secondary | ICD-10-CM | POA: Insufficient documentation

## 2020-03-24 DIAGNOSIS — Z794 Long term (current) use of insulin: Secondary | ICD-10-CM | POA: Insufficient documentation

## 2020-03-24 DIAGNOSIS — R531 Weakness: Secondary | ICD-10-CM | POA: Insufficient documentation

## 2020-03-24 DIAGNOSIS — R739 Hyperglycemia, unspecified: Secondary | ICD-10-CM

## 2020-03-24 DIAGNOSIS — Z87891 Personal history of nicotine dependence: Secondary | ICD-10-CM | POA: Insufficient documentation

## 2020-03-24 LAB — CBC WITH DIFFERENTIAL/PLATELET
Abs Immature Granulocytes: 0.03 10*3/uL (ref 0.00–0.07)
Basophils Absolute: 0 10*3/uL (ref 0.0–0.1)
Basophils Relative: 0 %
Eosinophils Absolute: 0.2 10*3/uL (ref 0.0–0.5)
Eosinophils Relative: 3 %
HCT: 31.8 % — ABNORMAL LOW (ref 36.0–46.0)
Hemoglobin: 10.9 g/dL — ABNORMAL LOW (ref 12.0–15.0)
Immature Granulocytes: 0 %
Lymphocytes Relative: 30 %
Lymphs Abs: 2 10*3/uL (ref 0.7–4.0)
MCH: 31.5 pg (ref 26.0–34.0)
MCHC: 34.3 g/dL (ref 30.0–36.0)
MCV: 91.9 fL (ref 80.0–100.0)
Monocytes Absolute: 0.7 10*3/uL (ref 0.1–1.0)
Monocytes Relative: 10 %
Neutro Abs: 3.8 10*3/uL (ref 1.7–7.7)
Neutrophils Relative %: 57 %
Platelets: 172 10*3/uL (ref 150–400)
RBC: 3.46 MIL/uL — ABNORMAL LOW (ref 3.87–5.11)
RDW: 12.4 % (ref 11.5–15.5)
WBC: 6.8 10*3/uL (ref 4.0–10.5)
nRBC: 0 % (ref 0.0–0.2)

## 2020-03-24 LAB — GLUCOSE, CAPILLARY: Glucose-Capillary: 108 mg/dL — ABNORMAL HIGH (ref 70–99)

## 2020-03-24 LAB — URINALYSIS, COMPLETE (UACMP) WITH MICROSCOPIC
Bacteria, UA: NONE SEEN
Bilirubin Urine: NEGATIVE
Glucose, UA: >=500 mg/dL — AB
Hgb urine dipstick: NEGATIVE
Ketones, ur: NEGATIVE mg/dL
Leukocytes,Ua: NEGATIVE
Nitrite: NEGATIVE
Protein, ur: NEGATIVE mg/dL
Specific Gravity, Urine: 1.012 (ref 1.005–1.030)
Squamous Epithelial / HPF: NONE SEEN (ref 0–5)
pH: 6 (ref 5.0–8.0)

## 2020-03-24 LAB — BASIC METABOLIC PANEL
Anion gap: 7 (ref 5–15)
BUN: 11 mg/dL (ref 6–20)
CO2: 26 mmol/L (ref 22–32)
Calcium: 9 mg/dL (ref 8.9–10.3)
Chloride: 104 mmol/L (ref 98–111)
Creatinine, Ser: 0.73 mg/dL (ref 0.44–1.00)
GFR calc Af Amer: >60 mL/min (ref >60)
GFR calc non Af Amer: >60 mL/min (ref >60)
Glucose, Bld: 90 mg/dL (ref 70–99)
Potassium: 3.4 mmol/L — ABNORMAL LOW (ref 3.5–5.1)
Sodium: 137 mmol/L (ref 135–145)

## 2020-03-24 LAB — LACTIC ACID, PLASMA: Lactic Acid, Venous: 1.8 mmol/L (ref 0.5–1.9)

## 2020-03-24 MED ORDER — SODIUM CHLORIDE 0.9 % IV BOLUS
1000.0000 mL | Freq: Once | INTRAVENOUS | Status: AC
  Administered 2020-03-24: 1000 mL via INTRAVENOUS

## 2020-03-24 MED ORDER — SODIUM CHLORIDE 0.9 % IV SOLN: Freq: Once | INTRAVENOUS | Status: AC

## 2020-03-24 NOTE — ED Notes (Signed)
RN has been able to contact [name] [name] @ [phone]. Patient family member stated they will be picking patient up from [facility] ED.

## 2020-03-24 NOTE — ED Provider Notes (Signed)
[facility] Emergency Department Provider Note   ____________________________________________   I have reviewed the triage vital signs and the nursing notes.   HISTORY  Chief Complaint Hyperglycemia   History limited by: Not Limited   HPI [name] [name] is a 59 y.o. female who presents to the emergency department today because of concern for high blood sugar. Per papers that came with patient her sugars have been reading high and in the 500s. The patient had been given insulin at home without significant improvement. The patient states that she has been feeling weak. The patient did have ORIF of right ankle last month and states over the past 3-4 days has started noticing increasing pain and swelling of the ankle. She denies any fevers.    Records reviewed. Per medical record review patient has a history of recent admission with right ankle surgery.   Past Medical History:  Diagnosis Date  . Cervical disc disorder with radiculopathy of cervical region   . Chronic low back pain    HNP  . Chronic pain syndrome   . Degenerative joint disease   . Depression dx 1997  . Diabetes mellitus, type 2 ([place]) 2011   No insulin  . Diabetic polyneuropathy ([place])   . GERD (gastroesophageal reflux disease)   . Glaucoma   . Headache(784.0)   . Herniated disc   . Hypertension    Lab 11/2011:  CXR, EKG, CBC, TSH, BMet, troponin-normal; lipid profile: 188, 131, 36, 126   . Lumbar herniated disc   . Non-compliance   . Pancreatic cyst    Endoscopic aspiration in 09/2009  . Pneumonia 08/02/2012  . Shingles   . 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 Torn meniscus   . Vertigo     Patient Active Problem List   Diagnosis Date Noted  . Dehydration   . Hyperglycemia   . Preoperative clearance 02/25/2020  . Fracture of medial malleolus, right, closed 02/24/2020  . Hyperglycemia due to type 2 diabetes mellitus ([place]) 02/24/2020  . Autonomic dysfunction 02/24/2020   . Fall 02/24/2020  . Adrenal insufficiency ([place]) 09/07/2019  . Recurrent syncope 09/06/2019  . Sinus tachycardia 09/06/2019  . Hypokalemia 09/06/2019  . Pulmonary nodules 09/06/2019  . Orthostatic hypotension   . Syncope and collapse 06/30/2019  . Hypotension 06/29/2019  . Chronic pain syndrome 08/24/2017  . Diabetic polyneuropathy associated with type 2 diabetes mellitus ([place]) 08/24/2017  . Restless leg syndrome 08/24/2017  . Vertigo 04/09/2016  . Acute sinusitis 06/27/2015  . Cervical disc disorder with radiculopathy of cervical region 02/20/2015  . Neck muscle spasm 10/23/2014  . Healthcare maintenance 10/23/2014  . Depression 11/25/2013  . Diabetes type 2, uncontrolled ([place])     Past Surgical History:  Procedure Laterality Date  . ABDOMINAL HYSTERECTOMY    . CESAREAN SECTION    . ORIF ANKLE FRACTURE Right 02/25/2020   Procedure: OPEN REDUCTION INTERNAL FIXATION (ORIF) ANKLE FRACTURE;  Surgeon: Lovell Sheehan, MD;  Location: [facility] ORS;  Service: Orthopedics;  Laterality: Right;    Prior to Admission medications   Medication Sig Start Date End Date Taking? Authorizing Provider  acetaminophen (TYLENOL) 500 MG tablet Take 2 tablets (1,000 mg total) by mouth every 6 (six) hours as needed for mild pain or headache (pain). 07/02/19   Domenic Polite, MD  Cholecalciferol (VITAMIN D-3) 125 MCG (5000 UT) TABS Take 1 tablet by mouth daily. 01/24/20   Hilts, Legrand Como, MD  diclofenac Sodium (VOLTAREN) 1 %  GEL Apply 1 application topically 4 (four) times daily. Patient not taking: Reported on 01/24/2020 01/16/20   Camillia Herter, NP  DULoxetine (CYMBALTA) 60 MG capsule TAKE 1 CAPSULE (60 MG TOTAL) BY MOUTH 2 (TWO) TIMES DAILY. 02/01/20   Fulp, Cammie, MD  feeding supplement, GLUCERNA SHAKE, (GLUCERNA SHAKE) LIQD Take 237 mLs by mouth daily. 02/27/20   Lorella Nimrod, MD  fludrocortisone (FLORINEF) 0.1 MG tablet TAKE 1 TABLET (0.1 MG TOTAL) BY MOUTH DAILY. 10/04/19   Lorretta Harp, MD   gabapentin (NEURONTIN) 600 MG tablet TAKE ONE TABLET (600 MG) BY MOUTH EVERY MORNING AND AFTERNOON, TAKE TWO TABLETS AT NIGHT 02/06/20   Fulp, Cammie, MD  Glucosamine Sulfate 1000 MG CAPS Take 1 capsule (1,000 mg total) by mouth 2 (two) times daily. Patient not taking: Reported on 02/25/2020 01/24/20   Hilts, Michael, MD  glucose blood test strip Relion Testing Strips Use as instructed 08/18/17   Lockamy, Timothy, DO  HUMALOG KWIKPEN 100 UNIT/ML KwikPen Inject 0.18 mLs (18 Units total) into the skin 3 (three) times daily. 03/08/20   Ladell Pier, MD  insulin glargine (LANTUS SOLOSTAR) 100 UNIT/ML Solostar Pen Inject 45 Units into the skin daily. 03/08/20   Ladell Pier, MD  Insulin Pen Needle (PEN NEEDLES) 31G X 8 MM MISC Use as directed 09/07/19   Eugenie Filler, MD  meloxicam (MOBIC) 15 MG tablet Take 0.5-1 tablets (7.5-15 mg total) by mouth daily as needed for pain. 01/24/20   Hilts, Legrand Como, MD  Turmeric 500 MG CAPS Take 500 mg by mouth 2 (two) times daily. Patient not taking: Reported on 02/25/2020 01/24/20   Hilts, Legrand Como, MD    Allergies Tramadol  Family History  Problem Relation Age of Onset  . Depression Mother        type 1  . Diabetes Mother   . Renal Disease Mother   . Lung cancer Father   . Heart attack Brother        Died age 53 - was told due to massive MI/heart "exploded"  . Heart failure Maternal Grandmother        Details not totally clear    Social History Social History   Tobacco Use  . Smoking status: Former Smoker    Quit date: 08/03/1978    Years since quitting: 41.6  . Smokeless tobacco: Never Used  . Tobacco comment: Smoked age [age]  Substance Use Topics  . Alcohol use: No    Comment: Former  . Drug use: No    Review of Systems Constitutional: No fever/chills. Positive for generalized weakness.  Eyes: No visual changes. ENT: No sore throat. Cardiovascular: Denies chest pain. Respiratory: Denies shortness of breath. Gastrointestinal: No  abdominal pain.  No nausea, no vomiting.  No diarrhea.   Genitourinary: Negative for dysuria. Musculoskeletal: Positive for right ankle pain.  Skin: Negative for rash. Neurological: Negative for headaches, focal weakness or numbness.  ____________________________________________   PHYSICAL EXAM:  VITAL SIGNS: ED Triage Vitals  Enc Vitals Group     BP 03/24/20 0416 (!) 61/46     Pulse Rate 03/24/20 0416 85     Resp 03/24/20 0416 18     Temp 03/24/20 0416 98.6 F (37 C)     Temp Source 03/24/20 0416 Oral     SpO2 03/24/20 0416 97 %     Weight 03/24/20 0417 140 lb (63.5 kg)     Height 03/24/20 0417 5\' 6"  (1.676 m)     Head Circumference --  Peak Flow --      Pain Score 03/24/20 0416 4   Constitutional: Alert and oriented.  Eyes: Conjunctivae are normal.  ENT      Head: Normocephalic and atraumatic.      Nose: No congestion/rhinnorhea.      Mouth/Throat: Mucous membranes are moist.      Neck: No stridor. Hematological/Lymphatic/Immunilogical: No cervical lymphadenopathy. Cardiovascular: Normal rate, regular rhythm.  No murmurs, rubs, or gallops.  Respiratory: Normal respiratory effort without tachypnea nor retractions. Breath sounds are clear and equal bilaterally. No wheezes/rales/rhonchi. Gastrointestinal: Soft and non tender. No rebound. No guarding.  Genitourinary: Deferred Musculoskeletal: Right ankle with small amount of swelling. No erythema or warmth appreciated.  Neurologic:  Normal speech and language. No gross focal neurologic deficits are appreciated.  Skin:  Skin is warm, dry and intact. No rash noted. Psychiatric: Mood and affect are normal. Speech and behavior are normal. Patient exhibits appropriate insight and judgment.  ____________________________________________    LABS (pertinent positives/negatives)  CBC wbc 6.8, hgb 10.9, plt 172 Lactic acid 1.8 BMP wnl except k  3.4 ____________________________________________   EKG  None  ____________________________________________    RADIOLOGY  Right ankle Recent ORIF  ____________________________________________   PROCEDURES  Procedures  ____________________________________________   INITIAL IMPRESSION / ASSESSMENT AND PLAN / ED COURSE  Pertinent labs & imaging results that were available during my care of the patient were reviewed by me and considered in my medical decision making (see chart for details).   Patient presented to the emergency department today with primary concern for elevated blood sugar and weakness.  Patient also had some complaint of right ankle pain.  On exam here patient was somewhat somnolent and cannot give a great history.  She was noted to be quite hypotensive.  She was given IV fluids and vital signs improved.  Additionally in further conversation it came out that the patient had also been given medication to help her sleep.  I do wonder if some of the weakness and low blood pressure is related to medication.  She also complained of right ankle pain.  X-rays here do not show any concerning findings around the hardware.  She does have some slight swelling there.  On exam I do not appreciate any erythema or warmth.  I do have a low suspicion for infection.  No elevated white count or fever.  Lactic acid level was negative.  Furthermore the patient had a complaint for elevated blood sugar.  Blood sugar here however the normal limits.  Will continue to hydrate and observe here in the emergency department.  Awaiting urine at time of signout.  ____________________________________________   FINAL CLINICAL IMPRESSION(S) / ED DIAGNOSES  Final diagnoses:  Weakness  Hypotension, unspecified hypotension type     Note: This dictation was prepared with Dragon dictation. Any transcriptional errors that result from this process are unintentional     Nance Pear, MD 03/24/20  [phone]

## 2020-03-24 NOTE — ED Triage Notes (Signed)
Pt reports not taking any meds all day Saturday

## 2020-03-24 NOTE — ED Notes (Signed)
Pt placed in trendelenburg position.

## 2020-03-24 NOTE — ED Provider Notes (Signed)
I assumed care of this patient at approximately 0 700.  Please see outgoing providers note for further details regarding patient's initial evaluation assessment.  In brief patient presented with low blood pressure and reports of blood glucose monitor reading "high" for the last several days.  Patient has been getting insulin at home and did receive an unspecified medication family member to help her sleep last night.  She is somewhat sleepy on arrival to the ED.  Her blood pressure improved with IV fluids and she did not have any evidence of acidosis or DKA on her initial work-up.  At signout plan is to reassess patient and follow-up UA.  UA is remarkable for significant glucose but does not show evidence of infection.  Patient is arousable and states she is sleepy but is oriented denies any acute symptoms including acute chest pain, cough, shortness of breath, abdominal pain but does endorse chronic ankle pain.  On my reassessment patient is alert and oriented x4.  She is hemodynamically stable with a heart rate in the 90s on my bedside exam.  She did not have her walking boot which is supposed to wear at all times after her right ankle ORIF.  Replacement in place.  Patient was observed to stand and ambulate with steady gait unassisted.  States she felt much better than she did earlier sleepy.  Advised patient that she should contact orthopedist as possible for any persistent pain in her ankle after her surgery.  Also advised patient that she should contact her PCP if she is having persistent elevated blood sugars that she may require adjustment of her insulin regimen.  Discharged stable condition.  Strict return precautions advised and discussed.  Also advised patient to never take any sleeping medications are not prescribed to her as this may have contributed to her hypotension seen on arrival.   Lucrezia Starch, MD 03/24/20 [phone]

## 2020-03-24 NOTE — ED Triage Notes (Signed)
Patient presents to Emergency Department via [facility] from home with complaints of hyperglycemia  Per note from daughter-in-law at bedside:  Time, CBG and humalog given  Pt reports right ankle pain from break approx 1 month ago and noncompliance with ankle brace/boot, pt reports being sleepy att, pt reports not knowing "why I'm here"  History of hypotension, DM   Pt hypotensive and triage but A&O x4

## 2020-03-24 NOTE — ED Notes (Signed)
Fluids are 50% complete infusing at this time.

## 2020-03-24 NOTE — ED Notes (Signed)
Pt very sleepy, reports that daughter-in-law's mother gave pt "something to help me sleep"  HIPAA compliant message left for Roselynne Lortz, [place], [phone]

## 2020-03-27 ENCOUNTER — Encounter: Payer: Medicaid Other | Admitting: Physical Therapy

## 2020-04-01 ENCOUNTER — Encounter: Payer: Medicaid Other | Admitting: Physical Therapy

## 2020-04-04 ENCOUNTER — Encounter: Payer: Self-pay | Admitting: Physician Assistant

## 2020-04-04 ENCOUNTER — Ambulatory Visit: Payer: Self-pay | Attending: Physician Assistant | Admitting: Physician Assistant

## 2020-04-04 ENCOUNTER — Encounter: Payer: Medicaid Other | Admitting: Physical Therapy

## 2020-04-04 ENCOUNTER — Other Ambulatory Visit: Payer: Self-pay | Admitting: Physician Assistant

## 2020-04-04 ENCOUNTER — Ambulatory Visit (HOSPITAL_BASED_OUTPATIENT_CLINIC_OR_DEPARTMENT_OTHER): Payer: Self-pay | Admitting: Licensed Clinical Social Worker

## 2020-04-04 ENCOUNTER — Other Ambulatory Visit: Payer: Self-pay

## 2020-04-04 VITALS — BP 132/82 | HR 108 | Temp 97.7°F

## 2020-04-04 DIAGNOSIS — E1143 Type 2 diabetes mellitus with diabetic autonomic (poly)neuropathy: Secondary | ICD-10-CM | POA: Insufficient documentation

## 2020-04-04 DIAGNOSIS — Z9114 Patient's other noncompliance with medication regimen: Secondary | ICD-10-CM | POA: Insufficient documentation

## 2020-04-04 DIAGNOSIS — G8929 Other chronic pain: Secondary | ICD-10-CM | POA: Insufficient documentation

## 2020-04-04 DIAGNOSIS — Z91199 Patient's noncompliance with other medical treatment and regimen due to unspecified reason: Secondary | ICD-10-CM

## 2020-04-04 DIAGNOSIS — I10 Essential (primary) hypertension: Secondary | ICD-10-CM | POA: Insufficient documentation

## 2020-04-04 DIAGNOSIS — Z1331 Encounter for screening for depression: Secondary | ICD-10-CM

## 2020-04-04 DIAGNOSIS — R296 Repeated falls: Secondary | ICD-10-CM

## 2020-04-04 DIAGNOSIS — Z09 Encounter for follow-up examination after completed treatment for conditions other than malignant neoplasm: Secondary | ICD-10-CM

## 2020-04-04 DIAGNOSIS — Z79899 Other long term (current) drug therapy: Secondary | ICD-10-CM | POA: Insufficient documentation

## 2020-04-04 DIAGNOSIS — M5010 Cervical disc disorder with radiculopathy, unspecified cervical region: Secondary | ICD-10-CM | POA: Insufficient documentation

## 2020-04-04 DIAGNOSIS — K219 Gastro-esophageal reflux disease without esophagitis: Secondary | ICD-10-CM | POA: Insufficient documentation

## 2020-04-04 DIAGNOSIS — Z9119 Patient's noncompliance with other medical treatment and regimen: Secondary | ICD-10-CM

## 2020-04-04 DIAGNOSIS — M545 Low back pain: Secondary | ICD-10-CM | POA: Insufficient documentation

## 2020-04-04 DIAGNOSIS — Z794 Long term (current) use of insulin: Secondary | ICD-10-CM | POA: Insufficient documentation

## 2020-04-04 DIAGNOSIS — F331 Major depressive disorder, recurrent, moderate: Secondary | ICD-10-CM

## 2020-04-04 LAB — GLUCOSE, POCT (MANUAL RESULT ENTRY): POC Glucose: 343 mg/dl — AB (ref 70–99)

## 2020-04-04 MED ORDER — INSULIN ASPART 100 UNIT/ML ~~LOC~~ SOLN
15.0000 [IU] | Freq: Once | SUBCUTANEOUS | Status: AC
  Administered 2020-04-04: 15 [IU] via SUBCUTANEOUS

## 2020-04-04 MED ORDER — LANTUS SOLOSTAR 100 UNIT/ML ~~LOC~~ SOPN: 45.0000 [IU] | PEN_INJECTOR | Freq: Every day | SUBCUTANEOUS | 99 refills | Status: DC

## 2020-04-04 MED ORDER — HUMALOG KWIKPEN 100 UNIT/ML ~~LOC~~ SOPN: 18.0000 [IU] | PEN_INJECTOR | Freq: Three times a day (TID) | SUBCUTANEOUS | 99 refills | Status: DC

## 2020-04-04 MED ORDER — GABAPENTIN 600 MG PO TABS: ORAL_TABLET | ORAL | 2 refills | Status: DC

## 2020-04-04 MED ORDER — DULOXETINE HCL 60 MG PO CPEP: 60.0000 mg | ORAL_CAPSULE | Freq: Two times a day (BID) | ORAL | 1 refills | Status: DC

## 2020-04-04 NOTE — Progress Notes (Signed)
Patient ID: [name] [name], female   DOB: 21-Feb-1961, 59 y.o.   MRN: [id number]     [name] [name], is a 59 y.o. female  ZDG:[id number]  [id number]  DOB - 1961-06-07  Subjective:  Chief Complaint and HPI: [name] [name] is a 59 y.o. female here today to establish for a follow up visit After 9/05 ED visit for weakness and hyperglycemia.  She is still having hyperglycemia.  Does not always take meds.  Did not take insulin today.  No new issues or concerns.   Abnormal/high scores on PHQ9 and GAD7.  She says "I have prayed for God to take me my whole life."  She has seen multiple counselors.  meds keep her from crying all the time.   Needs bone density screening  From ED note: Patient presented to the emergency department today with primary concern for elevated blood sugar and weakness.  Patient also had some complaint of right ankle pain.  On exam here patient was somewhat somnolent and cannot give a great history.  She was noted to be quite hypotensive.  She was given IV fluids and vital signs improved.  Additionally in further conversation it came out that the patient had also been given medication to help her sleep.  I do wonder if some of the weakness and low blood pressure is related to medication.  She also complained of right ankle pain.  X-rays here do not show any concerning findings around the hardware.  She does have some slight swelling there.  On exam I do not appreciate any erythema or warmth.  I do have a low suspicion for infection.  No elevated white count or fever.  Lactic acid level was negative.  Furthermore the patient had a complaint for elevated blood sugar.  Blood sugar here however the normal limits.  Will continue to hydrate and observe here in the emergency department.  Awaiting urine at time of signout.   ED/Hospital notes reviewed.   Social History: Family history:  ROS:   Constitutional:  No f/c, No night sweats, No unexplained weight loss. EENT:  No vision changes, No  blurry vision, No hearing changes. No mouth, throat, or ear problems.  Respiratory: No cough, No SOB Cardiac: No CP, no palpitations GI:  No abd pain, No N/V/D. GU: No Urinary s/sx Musculoskeletal: leg pain Neuro: No headache, no dizziness, no motor weakness.  Skin: No rash Endocrine:  No polydipsia. No polyuria.  Psych: Denies SI/HI  No problems updated.  ALLERGIES: Allergies  Allergen Reactions  . Tramadol Nausea And Vomiting    REACTION: Projectile vomiting    PAST MEDICAL HISTORY: Past Medical History:  Diagnosis Date  . Cervical disc disorder with radiculopathy of cervical region   . Chronic low back pain    HNP  . Chronic pain syndrome   . Degenerative joint disease   . Depression dx 1997  . Diabetes mellitus, type 2 ([place]) 2011   No insulin  . Diabetic polyneuropathy ([place])   . GERD (gastroesophageal reflux disease)   . Glaucoma   . Headache(784.0)   . Herniated disc   . Hypertension    Lab 11/2011:  CXR, EKG, CBC, TSH, BMet, troponin-normal; lipid profile: 188, 131, 36, 126   . Lumbar herniated disc   . Non-compliance   . Pancreatic cyst    Endoscopic aspiration in 09/2009  . Pneumonia 08/02/2012  . Shingles   . 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 Torn meniscus   .  Vertigo     MEDICATIONS AT HOME: Prior to Admission medications   Medication Sig Start Date End Date Taking? Authorizing Provider  acetaminophen (TYLENOL) 500 MG tablet Take 2 tablets (1,000 mg total) by mouth every 6 (six) hours as needed for mild pain or headache (pain). 07/02/19  Yes Domenic Polite, MD  Cholecalciferol (VITAMIN D-3) 125 MCG (5000 UT) TABS Take 1 tablet by mouth daily. 01/24/20  Yes Hilts, Legrand Como, MD  DULoxetine (CYMBALTA) 60 MG capsule Take 1 capsule (60 mg total) by mouth 2 (two) times daily. 04/04/20  Yes Chance Karam, Dionne Bucy, PA-C  fludrocortisone (FLORINEF) 0.1 MG tablet TAKE 1 TABLET (0.1 MG TOTAL) BY MOUTH DAILY. 10/04/19  Yes Lorretta Harp, MD  gabapentin  (NEURONTIN) 600 MG tablet TAKE ONE TABLET (600 MG) BY MOUTH EVERY MORNING AND AFTERNOON, TAKE TWO TABLETS AT NIGHT 04/04/20  Yes Tylicia Sherman M, PA-C  Glucosamine Sulfate 1000 MG CAPS Take 1 capsule (1,000 mg total) by mouth 2 (two) times daily. 01/24/20  Yes Hilts, Michael, MD  glucose blood test strip Relion Testing Strips Use as instructed 08/18/17  Yes Lockamy, Timothy, DO  HUMALOG KWIKPEN 100 UNIT/ML KwikPen Inject 18 Units into the skin 3 (three) times daily. 04/04/20  Yes Takeyla Million M, PA-C  insulin glargine (LANTUS SOLOSTAR) 100 UNIT/ML Solostar Pen Inject 45 Units into the skin daily. 04/04/20  Yes Argentina Donovan, PA-C  Insulin Pen Needle (PEN NEEDLES) 31G X 8 MM MISC Use as directed 09/07/19  Yes Eugenie Filler, MD  diclofenac Sodium (VOLTAREN) 1 % GEL Apply 1 application topically 4 (four) times daily. Patient not taking: Reported on 04/04/2020 01/16/20   Camillia Herter, NP  feeding supplement, GLUCERNA SHAKE, (GLUCERNA SHAKE) LIQD Take 237 mLs by mouth daily. 02/27/20   Lorella Nimrod, MD  meloxicam (MOBIC) 15 MG tablet Take 0.5-1 tablets (7.5-15 mg total) by mouth daily as needed for pain. Patient not taking: Reported on 04/04/2020 01/24/20   Hilts, Legrand Como, MD  Turmeric 500 MG CAPS Take 500 mg by mouth 2 (two) times daily. Patient not taking: Reported on 04/04/2020 01/24/20   Hilts, Legrand Como, MD     Objective:  EXAM:   Vitals:   04/04/20 1413  BP: 132/82  Pulse: (!) 108  Temp: 97.7 F (36.5 C)  TempSrc: Temporal  SpO2: 99%    General appearance : A&OX3. NAD. Non-toxic-appearing HEENT: Atraumatic and Normocephalic.  PERRLA.  Neck: supple, no JVD. No cervical lymphadenopathy. No thyromegaly Chest/Lungs:  Breathing-non-labored, Good air entry bilaterally, breath sounds normal without rales, rhonchi, or wheezing  CVS: S1 S2 regular, no murmurs, gallops, rubs  Extremities: Bilateral Lower Ext shows no edema, both legs are warm to touch with = pulse throughout;  Wearing R leg  brace Neurology:  CN II-XII grossly intact, Non focal.   Psych:  TP linear. J/I WNL. Normal speech. Appropriate eye contact and affect.  Skin:  No Rash  Data Review Lab Results  Component Value Date   HGBA1C 14.8 (H) 02/24/2020   HGBA1C 11.5 (H) 09/06/2019   HGBA1C 13.1 (A) 06/28/2019     Assessment & Plan   1. Type 2 diabetes mellitus with diabetic autonomic neuropathy, with long-term current use of insulin (HCC) Uncontrolled/noncompliance.  Compliance imperative.   - Glucose (CBG) - insulin aspart (novoLOG) injection 15 Units - insulin glargine (LANTUS SOLOSTAR) 100 UNIT/ML Solostar Pen; Inject 45 Units into the skin daily.  Dispense: 15 mL; Refill: prn - HUMALOG KWIKPEN 100 UNIT/ML KwikPen; Inject 18 Units into  the skin 3 (three) times daily.  Dispense: 15 mL; Refill: prn - gabapentin (NEURONTIN) 600 MG tablet; TAKE ONE TABLET (600 MG) BY MOUTH EVERY MORNING AND AFTERNOON, TAKE TWO TABLETS AT NIGHT  Dispense: 120 tablet; Refill: 2 - DULoxetine (CYMBALTA) 60 MG capsule; Take 1 capsule (60 mg total) by mouth 2 (two) times daily.  Dispense: 60 capsule; Refill: 1  2. Hospital discharge follow-up  3. Noncompliance Discussed importance at length  4. Positive depression screening Counseled on self-care, had her meet with Christa See - Ambulatory referral to Psychology - Ambulatory referral to Social Work - DULoxetine (CYMBALTA) 60 MG capsule; Take 1 capsule (60 mg total) by mouth 2 (two) times daily.  Dispense: 60 capsule; Refill: 1     Patient have been counseled extensively about nutrition and exercise  Return for 3 weeks with [facility];  2 months with PCP.  The patient was given clear instructions to go to ER or return to medical center if symptoms don't improve, worsen or new problems develop. The patient verbalized understanding. The patient was told to call to get lab results if they haven't heard anything in the next week.     Freeman Caldron, PA-C [facility] [place], [place]   04/04/2020, 2:27 PM

## 2020-04-04 NOTE — Patient Instructions (Signed)
Take your lantus when you get home.  Check your blood sugars every 4-6 hours. Do not skip medication doses

## 2020-04-05 MED FILL — LANTUS SOLOSTAR 100 UNITS/M: 100 | 33 days supply | Qty: 15 | Fill #0

## 2020-04-05 MED FILL — DULoxetine HCL 60 MG CPEP: 60 | 30 days supply | Qty: 60 | Fill #0

## 2020-04-05 MED FILL — HUMALOG 100 UNITS/ML KWIKPE: 100 | 27 days supply | Qty: 15 | Fill #0

## 2020-04-08 ENCOUNTER — Encounter: Payer: Medicaid Other | Admitting: Physical Therapy

## 2020-04-08 MED FILL — GABAPENTIN 600 MG TABLET: 600 | 30 days supply | Qty: 120 | Fill #0

## 2020-04-11 ENCOUNTER — Encounter: Payer: Medicaid Other | Admitting: Physical Therapy

## 2020-04-15 ENCOUNTER — Encounter: Payer: Medicaid Other | Admitting: Physical Therapy

## 2020-04-15 ENCOUNTER — Encounter: Payer: Self-pay | Admitting: Internal Medicine

## 2020-04-17 ENCOUNTER — Encounter: Payer: Medicaid Other | Admitting: Physical Therapy

## 2020-04-19 ENCOUNTER — Emergency Department (HOSPITAL_COMMUNITY)
Admission: EM | Admit: 2020-04-19 | Discharge: 2020-04-20 | Disposition: A | Discharge status: Home / Self Care | Payer: Self-pay | Attending: Emergency Medicine | Admitting: Emergency Medicine

## 2020-04-19 DIAGNOSIS — L299 Pruritus, unspecified: Secondary | ICD-10-CM | POA: Insufficient documentation

## 2020-04-19 DIAGNOSIS — I9589 Other hypotension: Secondary | ICD-10-CM | POA: Insufficient documentation

## 2020-04-19 DIAGNOSIS — Z794 Long term (current) use of insulin: Secondary | ICD-10-CM | POA: Insufficient documentation

## 2020-04-19 DIAGNOSIS — E860 Dehydration: Secondary | ICD-10-CM | POA: Insufficient documentation

## 2020-04-19 DIAGNOSIS — E1142 Type 2 diabetes mellitus with diabetic polyneuropathy: Secondary | ICD-10-CM | POA: Insufficient documentation

## 2020-04-19 DIAGNOSIS — R000 Tachycardia, unspecified: Secondary | ICD-10-CM | POA: Insufficient documentation

## 2020-04-19 DIAGNOSIS — Z87891 Personal history of nicotine dependence: Secondary | ICD-10-CM | POA: Insufficient documentation

## 2020-04-19 DIAGNOSIS — E861 Hypovolemia: Secondary | ICD-10-CM

## 2020-04-19 DIAGNOSIS — R739 Hyperglycemia, unspecified: Secondary | ICD-10-CM

## 2020-04-19 DIAGNOSIS — H938X3 Other specified disorders of ear, bilateral: Secondary | ICD-10-CM | POA: Insufficient documentation

## 2020-04-19 DIAGNOSIS — I10 Essential (primary) hypertension: Secondary | ICD-10-CM | POA: Insufficient documentation

## 2020-04-19 DIAGNOSIS — E1165 Type 2 diabetes mellitus with hyperglycemia: Secondary | ICD-10-CM | POA: Insufficient documentation

## 2020-04-19 NOTE — ED Triage Notes (Signed)
Pt coming with EMS for hypotension. Hx of same and usually received fluids and goes home. Pt c/o HA and ringing in ears Pt tachycardic and CBG at 470.

## 2020-04-20 ENCOUNTER — Other Ambulatory Visit: Payer: Self-pay

## 2020-04-20 ENCOUNTER — Emergency Department (HOSPITAL_COMMUNITY): Payer: Self-pay

## 2020-04-20 LAB — I-STAT VENOUS BLOOD GAS, ED
Acid-Base Excess: 2 mmol/L (ref 0.0–2.0)
Bicarbonate: 27.2 mmol/L (ref 20.0–28.0)
Calcium, Ion: 1.15 mmol/L (ref 1.15–1.40)
HCT: 34 % — ABNORMAL LOW (ref 36.0–46.0)
Hemoglobin: 11.6 g/dL — ABNORMAL LOW (ref 12.0–15.0)
O2 Saturation: 98 %
Potassium: 4 mmol/L (ref 3.5–5.1)
Sodium: 136 mmol/L (ref 135–145)
TCO2: 29 mmol/L (ref 22–32)
pCO2, Ven: 41.9 mmHg — ABNORMAL LOW (ref 44.0–60.0)
pH, Ven: 7.421 (ref 7.250–7.430)
pO2, Ven: 97 mmHg — ABNORMAL HIGH (ref 32.0–45.0)

## 2020-04-20 LAB — CBC WITH DIFFERENTIAL/PLATELET
Abs Immature Granulocytes: 0.03 10*3/uL (ref 0.00–0.07)
Basophils Absolute: 0 10*3/uL (ref 0.0–0.1)
Basophils Relative: 0 %
Eosinophils Absolute: 0.1 10*3/uL (ref 0.0–0.5)
Eosinophils Relative: 2 %
HCT: 35.5 % — ABNORMAL LOW (ref 36.0–46.0)
Hemoglobin: 11.6 g/dL — ABNORMAL LOW (ref 12.0–15.0)
Immature Granulocytes: 1 %
Lymphocytes Relative: 32 %
Lymphs Abs: 2 10*3/uL (ref 0.7–4.0)
MCH: 30.9 pg (ref 26.0–34.0)
MCHC: 32.7 g/dL (ref 30.0–36.0)
MCV: 94.4 fL (ref 80.0–100.0)
Monocytes Absolute: 0.5 10*3/uL (ref 0.1–1.0)
Monocytes Relative: 7 %
Neutro Abs: 3.7 10*3/uL (ref 1.7–7.7)
Neutrophils Relative %: 58 %
Platelets: 248 10*3/uL (ref 150–400)
RBC: 3.76 MIL/uL — ABNORMAL LOW (ref 3.87–5.11)
RDW: 12.3 % (ref 11.5–15.5)
WBC: 6.4 10*3/uL (ref 4.0–10.5)
nRBC: 0 % (ref 0.0–0.2)

## 2020-04-20 LAB — URINALYSIS, ROUTINE W REFLEX MICROSCOPIC
Bacteria, UA: NONE SEEN
Bilirubin Urine: NEGATIVE
Glucose, UA: >=500 mg/dL — AB
Hgb urine dipstick: NEGATIVE
Ketones, ur: NEGATIVE mg/dL
Nitrite: NEGATIVE
Protein, ur: NEGATIVE mg/dL
Specific Gravity, Urine: 1.035 — ABNORMAL HIGH (ref 1.005–1.030)
pH: 5 (ref 5.0–8.0)

## 2020-04-20 LAB — BASIC METABOLIC PANEL
Anion gap: 10 (ref 5–15)
Anion gap: 6 (ref 5–15)
BUN: 14 mg/dL (ref 6–20)
BUN: 14 mg/dL (ref 6–20)
CO2: 26 mmol/L (ref 22–32)
CO2: 23 mmol/L (ref 22–32)
Calcium: 8.7 mg/dL — ABNORMAL LOW (ref 8.9–10.3)
Calcium: 7.8 mg/dL — ABNORMAL LOW (ref 8.9–10.3)
Chloride: 98 mmol/L (ref 98–111)
Chloride: 96 mmol/L — ABNORMAL LOW (ref 98–111)
Creatinine, Ser: 0.67 mg/dL (ref 0.44–1.00)
Creatinine, Ser: 0.52 mg/dL (ref 0.44–1.00)
GFR calc Af Amer: >60 mL/min (ref >60)
GFR calc Af Amer: >60 mL/min (ref >60)
GFR calc non Af Amer: >60 mL/min (ref >60)
GFR calc non Af Amer: >60 mL/min (ref >60)
Glucose, Bld: 361 mg/dL — ABNORMAL HIGH (ref 70–99)
Glucose, Bld: 270 mg/dL — ABNORMAL HIGH (ref 70–99)
Potassium: 4.1 mmol/L (ref 3.5–5.1)
Potassium: 3.7 mmol/L (ref 3.5–5.1)
Sodium: 134 mmol/L — ABNORMAL LOW (ref 135–145)
Sodium: 125 mmol/L — ABNORMAL LOW (ref 135–145)

## 2020-04-20 LAB — ETHANOL: Alcohol, Ethyl (B): <10 mg/dL (ref <10)

## 2020-04-20 MED ORDER — ACETAMINOPHEN 500 MG PO TABS
1000.0000 mg | ORAL_TABLET | Freq: Once | ORAL | Status: AC
  Administered 2020-04-20: 1000 mg via ORAL
  Filled 2020-04-20: qty 2

## 2020-04-20 MED ORDER — IOHEXOL 350 MG/ML SOLN
100.0000 mL | Freq: Once | INTRAVENOUS | Status: AC | PRN
  Administered 2020-04-20: 100 mL via INTRAVENOUS

## 2020-04-20 MED ORDER — SODIUM CHLORIDE 0.9 % IV BOLUS (SEPSIS)
1000.0000 mL | Freq: Once | INTRAVENOUS | Status: AC
  Administered 2020-04-20: 1000 mL via INTRAVENOUS

## 2020-04-20 MED ORDER — MECLIZINE HCL 25 MG PO TABS
25.0000 mg | ORAL_TABLET | Freq: Once | ORAL | Status: AC
  Administered 2020-04-20: 25 mg via ORAL
  Filled 2020-04-20: qty 1

## 2020-04-20 MED ORDER — SODIUM CHLORIDE 0.9 % IV SOLN
1000.0000 mL | INTRAVENOUS | Status: DC
  Administered 2020-04-20: 1000 mL via INTRAVENOUS

## 2020-04-20 NOTE — ED Notes (Signed)
CBG taken at 0148 and resulted at 325

## 2020-04-20 NOTE — ED Provider Notes (Signed)
[facility] EMERGENCY DEPARTMENT Provider Note  CSN: [id number] Arrival date & time: 04/19/20 2359  Chief Complaint(s) Hypotension  HPI [name] [name] is a 59 y.o. female extensive past medical history listed below including adrenal insufficiency on [place], hypotension/orthostasis resulting in frequent falls who was previously placed on midodrine taken up by her primary care provider.  She presents today for several days of bilateral ear fullness, itching and ringing as well as dizziness.  Patient also reports lightheadedness upon standing.    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  She denies any focal deficits.  Patient reports generalized fatigue which prompted her call to EMS.  It was reported the patient had soft blood pressures but the actual reading was not given.  Patient was given IV fluids to which she responded well. EMS also noted CBGs in [place].   Denies any recent fevers or infections.  No associated chest pain or shortness of breath.  No abdominal pain.  No nausea or vomiting.  No diarrhea.  No urinary symptoms.    HPI  Past Medical History Past Medical History:  Diagnosis Date  . Cervical disc disorder with radiculopathy of cervical region   . Chronic low back pain    HNP  . Chronic pain syndrome   . Degenerative joint disease   . Depression dx 1997  . Diabetes mellitus, type 2 ([place]) 2011   No insulin  . Diabetic polyneuropathy ([place])   . GERD (gastroesophageal reflux disease)   . Glaucoma   . Headache(784.0)   . Herniated disc   . Hypertension    Lab 11/2011:  CXR, EKG, CBC, TSH, BMet, troponin-normal; lipid profile: 188, 131, 36, 126   . Lumbar herniated disc   . Non-compliance   . Pancreatic cyst    Endoscopic aspiration in 09/2009  . Pneumonia 08/02/2012  . Shingles   . 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 Torn meniscus   . Vertigo    Patient Active Problem List    Diagnosis Date Noted  . Dehydration   . Hyperglycemia   . Preoperative clearance 02/25/2020  . Fracture of medial malleolus, right, closed 02/24/2020  . Hyperglycemia due to type 2 diabetes mellitus ([place]) 02/24/2020  . Autonomic dysfunction 02/24/2020  . Fall 02/24/2020  . Adrenal insufficiency ([place]) 09/07/2019  . Recurrent syncope 09/06/2019  . Sinus tachycardia 09/06/2019  . Hypokalemia 09/06/2019  . Pulmonary nodules 09/06/2019  . Orthostatic hypotension   . Syncope and collapse 06/30/2019  . Hypotension 06/29/2019  . Chronic pain syndrome 08/24/2017  . Diabetic polyneuropathy associated with type 2 diabetes mellitus ([place]) 08/24/2017  . Restless leg syndrome 08/24/2017  . Vertigo 04/09/2016  . Acute sinusitis 06/27/2015  . Cervical disc disorder with radiculopathy of cervical region 02/20/2015  . Neck muscle spasm 10/23/2014  . Healthcare maintenance 10/23/2014  . Depression 11/25/2013  . Diabetes type 2, uncontrolled ([place])    Home Medication(s) Prior to Admission medications   Medication Sig Start Date End Date Taking? Authorizing Provider  acetaminophen (TYLENOL) 500 MG tablet Take 2 tablets (1,000 mg total) by mouth every 6 (six) hours as needed for mild pain or headache (pain). 07/02/19   Domenic Polite, MD  Cholecalciferol (VITAMIN D-3) 125 MCG (5000 UT) TABS Take 1 tablet by mouth daily. 01/24/20   Hilts, Legrand Como, MD  diclofenac Sodium (VOLTAREN) 1 % GEL Apply 1 application topically 4 (four) times daily. Patient not taking: Reported on  04/04/2020 01/16/20   Camillia Herter, NP  DULoxetine (CYMBALTA) 60 MG capsule Take 1 capsule (60 mg total) by mouth 2 (two) times daily. 04/04/20   Argentina Donovan, PA-C  feeding supplement, GLUCERNA SHAKE, (GLUCERNA SHAKE) LIQD Take 237 mLs by mouth daily. 02/27/20   Lorella Nimrod, MD  fludrocortisone (FLORINEF) 0.1 MG tablet TAKE 1 TABLET (0.1 MG TOTAL) BY MOUTH DAILY. 10/04/19   Lorretta Harp, MD  gabapentin (NEURONTIN) 600 MG tablet  TAKE ONE TABLET (600 MG) BY MOUTH EVERY MORNING AND AFTERNOON, TAKE TWO TABLETS AT NIGHT 04/04/20   Argentina Donovan, PA-C  Glucosamine Sulfate 1000 MG CAPS Take 1 capsule (1,000 mg total) by mouth 2 (two) times daily. 01/24/20   Hilts, Legrand Como, MD  glucose blood test strip [employer] Testing Strips Use as instructed 08/18/17   Lockamy, Timothy, DO  HUMALOG KWIKPEN 100 UNIT/ML KwikPen Inject 18 Units into the skin 3 (three) times daily. 04/04/20   Argentina Donovan, PA-C  insulin glargine (LANTUS SOLOSTAR) 100 UNIT/ML Solostar Pen Inject 45 Units into the skin daily. 04/04/20   Argentina Donovan, PA-C  Insulin Pen Needle (PEN NEEDLES) 31G X 8 MM MISC Use as directed 09/07/19   Eugenie Filler, MD  meloxicam (MOBIC) 15 MG tablet Take 0.5-1 tablets (7.5-15 mg total) by mouth daily as needed for pain. Patient not taking: Reported on 04/04/2020 01/24/20   Hilts, Legrand Como, MD  Turmeric 500 MG CAPS Take 500 mg by mouth 2 (two) times daily. Patient not taking: Reported on 04/04/2020 01/24/20   Eunice Blase, MD                                                                                                                                    Past Surgical History Past Surgical History:  Procedure Laterality Date  . ABDOMINAL HYSTERECTOMY    . CESAREAN SECTION    . ORIF ANKLE FRACTURE Right 02/25/2020   Procedure: OPEN REDUCTION INTERNAL FIXATION (ORIF) ANKLE FRACTURE;  Surgeon: Lovell Sheehan, MD;  Location: [facility] ORS;  Service: Orthopedics;  Laterality: Right;   Family History Family History  Problem Relation Age of Onset  . Depression Mother        type 1  . Diabetes Mother   . Renal Disease Mother   . Lung cancer Father   . Heart attack Brother        Died age 41 - was told due to massive MI/heart "exploded"  . Heart failure Maternal Grandmother        Details not totally clear    Social History Social History   Tobacco Use  . Smoking status: Former Smoker    Quit date: 08/03/1978    Years since  quitting: 41.7  . Smokeless tobacco: Never Used  . Tobacco comment: Smoked age [age]  Substance Use Topics  . Alcohol use: No    Comment: Former  . Drug use:  No   Allergies Tramadol  Review of Systems Review of Systems All other systems are reviewed and are negative for acute change except as noted in the HPI  Physical Exam Vital Signs  I have reviewed the triage vital signs BP 140/84   Pulse (!) 109   Temp 98.1 F (36.7 C) (Oral)   Resp 13   Ht 5\' 6"  (1.676 m)   Wt 63.5 kg   SpO2 100%   BMI 22.60 kg/m   Physical Exam Vitals reviewed.  Constitutional:      General: She is not in acute distress.    Appearance: She is well-developed. She is not diaphoretic.  HENT:     Head: Normocephalic and atraumatic.     Right Ear: There is impacted cerumen.     Left Ear: Tympanic membrane normal.     Nose: Nose normal.  Eyes:     General: No scleral icterus.       Right eye: No discharge.        Left eye: No discharge.     Conjunctiva/sclera: Conjunctivae normal.     Pupils: Pupils are equal, round, and reactive to light.  Cardiovascular:     Rate and Rhythm: Regular rhythm. Tachycardia present.     Heart sounds: No murmur heard.  No friction rub. No gallop.   Pulmonary:     Effort: Pulmonary effort is normal. No respiratory distress.     Breath sounds: Normal breath sounds. No stridor. No rales.  Abdominal:     General: There is no distension.     Palpations: Abdomen is soft.     Tenderness: There is no abdominal tenderness.  Musculoskeletal:        General: No tenderness.     Cervical back: Normal range of motion and neck supple.       Legs:  Skin:    General: Skin is warm and dry.     Findings: No erythema or rash.  Neurological:     Mental Status: She is alert and oriented to person, place, and time.     Comments: Mental Status:  Alert and oriented to person, place, and time.  Attention and concentration normal.  Speech clear.  Recent memory is  intact  Cranial Nerves:  II Visual Fields: Intact to confrontation. Visual fields intact. III, IV, VI: Pupils equal and reactive to light and near. Full eye movement with nystagmus  V Facial Sensation: Normal. No weakness of masticatory muscles  VII: No facial weakness or asymmetry  VIII Auditory Acuity: Grossly normal  IX/X: The uvula is midline; the palate elevates symmetrically  XI: Normal sternocleidomastoid and trapezius strength  XII: The tongue is midline. No atrophy or fasciculations.   Motor System: Muscle Strength: 5/5 and symmetric in the upper and lower extremities. No pronation or drift.  Muscle Tone: Tone and muscle bulk are normal in the upper and lower extremities.   Reflexes: DTRs: 1+ and symmetrical in all four extremities. No Clonus Coordination:No tremor.  Sensation: Intact to light touch..  Gait: deferred   HINTS Plus: Nystagmus: unilateral  Head impulse: abnormal Skew: normal Hearing: intact       ED Results and Treatments Labs (all labs ordered are listed, but only abnormal results are displayed) Labs Reviewed  BASIC METABOLIC PANEL - Abnormal; Notable for the following components:      Result Value   Sodium 134 (*)    Glucose, Bld 361 (*)    Calcium 8.7 (*)    All  other components within normal limits  CBC WITH DIFFERENTIAL/PLATELET - Abnormal; Notable for the following components:   RBC 3.76 (*)    Hemoglobin 11.6 (*)    HCT 35.5 (*)    All other components within normal limits  URINALYSIS, ROUTINE W REFLEX MICROSCOPIC - Abnormal; Notable for the following components:   APPearance HAZY (*)    Specific Gravity, Urine 1.035 (*)    Glucose, UA >=500 (*)    Leukocytes,Ua SMALL (*)    All other components within normal limits  I-STAT VENOUS BLOOD GAS, ED - Abnormal; Notable for the following components:   pCO2, Ven 41.9 (*)    pO2, Ven 97.0 (*)    HCT 34.0 (*)    Hemoglobin 11.6 (*)    All other components within normal limits  ETHANOL   BASIC METABOLIC PANEL  BASIC METABOLIC PANEL  BASIC METABOLIC PANEL  CBG MONITORING, ED                                                                                                                         EKG  EKG Interpretation  Date/Time:  Saturday April 20 2020 02:00:39 EDT Ventricular Rate:  113 PR Interval:    QRS Duration: 82 QT Interval:  341 QTC Calculation: 468 R Axis:   83 Text Interpretation: Sinus tachycardia Borderline right axis deviation Low voltage, precordial leads No acute changes Confirmed by Addison Lank [phone]) on 04/20/2020 2:05:19 AM      Radiology CT Angio Chest PE W and/or Wo Contrast  Result Date: 04/20/2020 CLINICAL DATA:  Shortness of breath EXAM: CT ANGIOGRAPHY CHEST WITH CONTRAST TECHNIQUE: Multidetector CT imaging of the chest was performed using the standard protocol during bolus administration of intravenous contrast. Multiplanar CT image reconstructions and MIPs were obtained to evaluate the vascular anatomy. CONTRAST:  152mL OMNIPAQUE IOHEXOL 350 MG/ML SOLN COMPARISON:  September 05, 2019 FINDINGS: Cardiovascular: There is a optimal opacification of the pulmonary arteries. There is no central,segmental, or subsegmental filling defects within the pulmonary arteries. The heart is normal in size. No pericardial effusion or thickening. No evidence right heart strain. There is normal three-vessel brachiocephalic anatomy without proximal stenosis. The thoracic aorta is normal in appearance. Coronary artery calcifications are seen. Mediastinum/Nodes: No hilar, mediastinal, or axillary adenopathy. Thyroid gland, trachea, and esophagus demonstrate no significant findings. Lungs/Pleura: Minimal bibasilar dependent atelectasis is noted. No pleural effusion or pneumothorax. No airspace consolidation. Upper Abdomen: No acute abnormalities present in the visualized portions of the upper abdomen. Musculoskeletal: No chest wall abnormality. No acute or significant  osseous findings. Review of the MIP images confirms the above findings. IMPRESSION: No central, segmental, or subsegmental pulmonary embolism. No other acute intrathoracic pathology to explain the patient's symptoms. Electronically Signed   By: Prudencio Pair M.D.   On: 04/20/2020 03:23    Pertinent labs & imaging results that were available during my care of the patient were reviewed by me and considered in my medical decision making (see chart  for details).  Medications Ordered in ED Medications  sodium chloride 0.9 % bolus 1,000 mL (0 mLs Intravenous Stopped 04/20/20 0230)    Followed by  0.9 %  sodium chloride infusion (1,000 mLs Intravenous New Bag/Given 04/20/20 0452)  meclizine (ANTIVERT) tablet 25 mg (25 mg Oral Given 04/20/20 0155)  iohexol (OMNIPAQUE) 350 MG/ML injection 100 mL (100 mLs Intravenous Contrast Given 04/20/20 0307)  acetaminophen (TYLENOL) tablet 1,000 mg (1,000 mg Oral Given 04/20/20 6644)                                                                                                                                    Procedures Procedures  (including critical care time)  Medical Decision Making / ED Course I have reviewed the nursing notes for this encounter and the patient's prior records (if available in EHR or on provided paperwork).   [name] [name] was evaluated in Emergency Department on 04/20/2020 for the symptoms described in the history of present illness. She was evaluated in the context of the global COVID-19 pandemic, which necessitated consideration that the patient might be at risk for infection with the SARS-CoV-2 virus that causes COVID-19. Institutional protocols and algorithms that pertain to the evaluation of patients at risk for COVID-19 are in a state of rapid change based on information released by regulatory bodies including the CDC and federal and state organizations. These policies and algorithms were followed during the patient's care in the  ED.    Dizziness: HINTS reassuring for peripheral vertigo, which she has a h/o. antivert given. Doubt CVA. Dizziness resolved.  Hypotension: Improved with IVF. Still tachy.  Will evaluate for infection, though low suspicion at this time. Given her ankle fracture and immobility with tachycardia, will get CTA to r/o PE. CTA negative for PE or infection UA not consistent with UTI. After IVFs, patients BP and HR significantly improved  Vitals with BMI 04/20/2020 04/20/2020 04/20/2020  Height - - 5\' 6"   Weight - - 140 lbs  BMI - - 03.47  Systolic 425 956 -  Diastolic 84 82 -  Pulse 98 112 -   She had hyperglycemia w/o DKA.   The patient appears reasonably screened and/or stabilized for discharge and I doubt any other medical condition or other [facility] requiring further screening, evaluation, or treatment in the ED at this time prior to discharge. Safe for discharge with strict return precautions.       Final Clinical Impression(s) / ED Diagnoses Final diagnoses:  Dehydration  Hypotension due to hypovolemia  Hyperglycemia   The patient appears reasonably screened and/or stabilized for discharge and I doubt any other medical condition or other [facility] requiring further screening, evaluation, or treatment in the ED at this time prior to discharge. Safe for discharge with strict return precautions.  Disposition: Discharge  Condition: Good  I have discussed the results, Dx and Tx plan with the patient/family who expressed understanding and agree(s) with  the plan. Discharge instructions discussed at length. The patient/family was given strict return precautions who verbalized understanding of the instructions. No further questions at time of discharge.    ED Discharge Orders    None       Follow Up: Antony Blackbird, MD [place] [address] [phone]  Call  To schedule an appointment for close follow up      This chart was dictated using voice  recognition software.  Despite best efforts to proofread,  errors can occur which can change the documentation meaning.   Fatima Blank, MD 04/20/20 [phone]

## 2020-04-22 ENCOUNTER — Encounter: Payer: Medicaid Other | Admitting: Physical Therapy

## 2020-04-22 LAB — CBG MONITORING, ED: Glucose-Capillary: 325 mg/dL — ABNORMAL HIGH (ref 70–99)

## 2020-04-23 ENCOUNTER — Telehealth (INDEPENDENT_AMBULATORY_CARE_PROVIDER_SITE_OTHER): Payer: No Payment, Other | Admitting: Psychiatry

## 2020-04-23 ENCOUNTER — Other Ambulatory Visit: Payer: Self-pay | Admitting: Psychiatry

## 2020-04-23 ENCOUNTER — Other Ambulatory Visit: Payer: Self-pay

## 2020-04-23 DIAGNOSIS — Z1331 Encounter for screening for depression: Secondary | ICD-10-CM

## 2020-04-23 DIAGNOSIS — Z794 Long term (current) use of insulin: Secondary | ICD-10-CM

## 2020-04-23 DIAGNOSIS — F331 Major depressive disorder, recurrent, moderate: Secondary | ICD-10-CM

## 2020-04-23 DIAGNOSIS — E1143 Type 2 diabetes mellitus with diabetic autonomic (poly)neuropathy: Secondary | ICD-10-CM | POA: Diagnosis not present

## 2020-04-23 DIAGNOSIS — F419 Anxiety disorder, unspecified: Secondary | ICD-10-CM | POA: Diagnosis not present

## 2020-04-23 MED ORDER — DULOXETINE HCL 60 MG PO CPEP: 60.0000 mg | ORAL_CAPSULE | Freq: Two times a day (BID) | ORAL | 3 refills | Status: DC

## 2020-04-23 MED ORDER — GABAPENTIN 600 MG PO TABS: ORAL_TABLET | ORAL | 2 refills | Status: DC

## 2020-04-24 ENCOUNTER — Encounter: Payer: Medicaid Other | Admitting: Physical Therapy

## 2020-04-26 ENCOUNTER — Other Ambulatory Visit: Payer: Self-pay

## 2020-04-26 ENCOUNTER — Ambulatory Visit: Payer: Medicaid Other | Admitting: Internal Medicine

## 2020-04-26 ENCOUNTER — Encounter: Payer: Self-pay | Admitting: Pharmacist

## 2020-04-26 ENCOUNTER — Ambulatory Visit: Payer: Self-pay | Attending: Family Medicine | Admitting: Licensed Clinical Social Worker

## 2020-04-26 ENCOUNTER — Other Ambulatory Visit: Payer: Self-pay | Admitting: Family Medicine

## 2020-04-26 ENCOUNTER — Ambulatory Visit (HOSPITAL_BASED_OUTPATIENT_CLINIC_OR_DEPARTMENT_OTHER): Payer: Medicaid Other | Admitting: Pharmacist

## 2020-04-26 ENCOUNTER — Encounter (HOSPITAL_COMMUNITY): Payer: Self-pay

## 2020-04-26 DIAGNOSIS — Z794 Long term (current) use of insulin: Secondary | ICD-10-CM

## 2020-04-26 DIAGNOSIS — E1143 Type 2 diabetes mellitus with diabetic autonomic (poly)neuropathy: Secondary | ICD-10-CM

## 2020-04-26 DIAGNOSIS — F331 Major depressive disorder, recurrent, moderate: Secondary | ICD-10-CM

## 2020-04-26 MED ORDER — TRUE METRIX BLOOD GLUCOSE TEST VI STRP: ORAL_STRIP | 2 refills | Status: DC

## 2020-04-26 MED ORDER — TRUE METRIX METER W/DEVICE KIT: PACK | 0 refills | Status: DC

## 2020-04-26 MED FILL — TRUE METRIX TEST STRIP: 100 days supply | Qty: 100 | Fill #0

## 2020-04-26 MED FILL — TRUE METRIX GO GLUCOSE METE: W/DEVICE | 1 days supply | Qty: 1 | Fill #0
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
## 2020-04-26 NOTE — Patient Instructions (Signed)
Follow up in one month.

## 2020-04-26 NOTE — Progress Notes (Signed)
    S:    PCP: Dr. Chapman Fitch   No chief complaint on file.  Patient arrives in good spirits.  Presents for diabetes evaluation, education, and management. Patient was referred on 04/04/2020 by Levada Dy.  Patient was last seen by Primary Care Provider on 09/20/2019.   Patient reports Diabetes was diagnosed in 2011.   Family/Social History:  - FHx: depression, DM, MI, CHF - Alcohol: denies use  - Tobacco: former smoker   Human resources officer affordability: [place] Medicaid (Family Planning)   Medication adherence denied. Does not take insulin every day.    Current diabetes medications include: Humalog 18 units TID (not taking); Lantus 45 units daily (last took ~1-2 days ago)  Patient denies hypoglycemic events.  Patient reported dietary habits: - Admits to dietary indiscretion  - Enjoys her sweets   Patient-reported exercise habits:  - Limited since requiring a walking boot but this recently removed. She plans to become more active.    Patient reports nocturia (nighttime urination).  Patient reports neuropathy (nerve pain). Patient reports visual changes. Patient denies self foot exams.     O:  Physical Exam   ROS   Lab Results  Component Value Date   HGBA1C 14.8 (H) 02/24/2020   There were no vitals filed for this visit.  Lipid Panel     Component Value Date/Time   CHOL 197 03/15/2019 1056   TRIG 260 (H) 03/15/2019 1056   HDL 36 (L) 03/15/2019 1056   CHOLHDL 5.5 (H) 03/15/2019 1056   CHOLHDL 7.6 04/10/2016 0714   VLDL 65 (H) 04/10/2016 0714   LDLCALC 109 (H) 03/15/2019 1056    Home fasting blood sugars: not checking   2 hour post-meal/random blood sugars: not checking .   Clinical Atherosclerotic Cardiovascular Disease (ASCVD): No  The 10-year ASCVD risk score Mikey Bussing DC Jr., et al., 2013) is: 9.7%   Values used to calculate the score:     Age: 59 years     Sex: Female     Is Non-Hispanic African American: No     Diabetic: Yes     Tobacco smoker: No      Systolic Blood Pressure: 470 mmHg     Is BP treated: No     HDL Cholesterol: 36 mg/dL     Total Cholesterol: 197 mg/dL    A/P: Diabetes longstanding currently uncontrolled. Patient is able to verbalize appropriate hypoglycemia management plan. Medication adherence denied. Control is suboptimal due to dietary indiscretion, physical inactivity, and medication noncompliance.  -Continued current regimen.  -I have encouraged her to take her insulin daily for 1 week. She will also check her home CBGs. We will review these next week via telemedicine.  -Extensively discussed pathophysiology of diabetes, recommended lifestyle interventions, dietary effects on blood sugar control -Counseled on s/sx of and management of hypoglycemia -Next A1C anticipated 05/2020.   Written patient instructions provided.  Total time in face to face counseling 20 minutes.   Follow up Pharmacist Clinic Visit in 1 week.   Patient seen with: Jacobo Forest, PharmD Candidate  [employer] [facility]  Class of 2022  [place], PharmD, [place] [phone]

## 2020-04-29 ENCOUNTER — Encounter: Payer: Medicaid Other | Admitting: Physical Therapy

## 2020-04-30 NOTE — BH Specialist Note (Signed)
Integrated Behavioral Health Initial Visit  MRN: [id number] Name: [name] [name]  Number of [place] Clinician visits:: 1/6 Session Start time: 2:25 PM session End time: 2:45 PM Total time: 20  Type of Service: [place] Interpretor:No. Interpretor Name and Language: N/A   Warm Hand Off Completed.       SUBJECTIVE: Tien Aispuro is a 59 y.o. female accompanied by Self Patient was referred by PA-C. McClung for depression. Patient reports the following symptoms/concerns: Patient reports ongoing symptoms of depression. States that she has observed a decrease in crying with compliance to meds. Duration of problem: Ongoing; Severity of problem: moderate  OBJECTIVE: Mood: Depressed and Affect: Appropriate Risk of harm to self or others: No plan to harm self or others  LIFE CONTEXT: Family and Social: Patient resides with family School/Work: Patient is uninsured Self-Care: She dissipate in medication management through PCP Life Changes: Patient sustained an injury after a recent fall. This has resulted in limited mobility negatively impacting patient's depression symptoms  STRENGTHS: Pt has strong support system Pt is participating in medication management  GOALS ADDRESSED: Patient will: 1. Reduce symptoms of: depression agreed to comply with medications to assist in management of symptoms 2. Increase knowledge and/or ability of: coping skills patient agreed to utilize healthy coping skills discussed  PROGRESS OF GOALS: Ongoing   INTERVENTIONS: Interventions utilized: Solution-Focused Strategies  Standardized Assessments completed: GAD-7 and PHQ 2&9  ASSESSMENT: Patient currently experiencing difficulty managing depression symptoms after patient sustained an injury resulting in limited mobility. She reports decrease in crying and denies current suicidal or homicidal ideations.   Patient may benefit from therapy and continued  medication management.  PLAN: 1. Follow up with behavioral health clinician on : 04/26/2020 2. Behavioral recommendations: Utilize strategies discussed and continue to comply with medications 3. Referral(s): [place] (In Clinic) 4. "From scale of 1-10, how likely are you to follow plan?":   Rebekah Chesterfield, LCSW 04/30/20 9:54 AM

## 2020-05-01 ENCOUNTER — Encounter: Payer: Medicaid Other | Admitting: Physical Therapy

## 2020-05-06 MED FILL — GABAPENTIN 600 MG TABLET: 600 | 30 days supply | Qty: 120 | Fill #1

## 2020-05-14 NOTE — BH Specialist Note (Signed)
Integrated Behavioral Health Follow Up Visit  MRN: [id number] Name: [name] [name]  Number of [place] Clinician visits: 2/6 Session Start time: 1:35 PM  Session End time: 2:05 PM Total time: 30  Type of Service: [place] Interpretor:No. Interpretor Name and Language: NA  SUBJECTIVE: [name] [name] is a 59 y.o. female accompanied by self Patient was referred by Weyman Pedro for depression. Patient reports the following symptoms/concerns: Pt reports that symptoms of depression and anxiety have decreased with medication management. Pt shared that she has completed visit with psychiatrist  Duration of problem: Ongoing; Severity of problem: moderate  OBJECTIVE: Mood: Pleasant and Affect: Appropriate Risk of harm to self or others: No plan to harm self or others  LIFE CONTEXT: Family and Social: Pt continues to receive support from family School/Work: Pt is uninsured Self-Care: Pt is participating in medication management and has established with behavioral health  Life Changes: Pt reports improvement in management of symptoms with medication management. States ankle has healed resulting in pt's ability to increase exercise  GOALS ADDRESSED: Patient will: 1.  Reduce symptoms of: anxiety and depression Pt agreed to continue with medication management 2.  Increase knowledge and/or ability of: coping skills Pt agreed to continue practicing gratitude thinking   INTERVENTIONS: Interventions utilized:  Solution-Focused Strategies Standardized Assessments completed: GAD-7 and PHQ 2&9 with C-SSRS  ASSESSMENT: Patient currently experiencing difficulty managing depression and anxiety symptoms. She shared decrease in symptoms with medication management; however, there is no reported change in phq9 and gad7 scores. Pt scored positive on phq9; however, denies active thoughts and/or intent of harming self or others.   Patient may benefit from  continued medication management and therapy. She has initiated services through outpatient [facility]. Gratitude thinking prompts provided to patient.   PLAN: 1. Follow up with behavioral health clinician on : Contact LCSW with any additional behavioral health and/or resource needs 2. Behavioral recommendations: Utilize strategies discussed and resources provided 3. Referral(s): [place] (In Clinic) 4. "From scale of 1-10, how likely are you to follow plan?":   Rebekah Chesterfield, LCSW 05/14/2020 2:19 AM

## 2020-05-24 MED FILL — ?DULOXetine HCL 60MG CAPS: 60 | 30 days supply | Qty: 60 | Fill #1

## 2020-05-26 ENCOUNTER — Emergency Department: Payer: Medicaid Other

## 2020-05-26 ENCOUNTER — Emergency Department
Admission: EM | Admit: 2020-05-26 | Discharge: 2020-05-27 | Disposition: A | Discharge status: Home / Self Care | Payer: Medicaid Other | Attending: Emergency Medicine | Admitting: Emergency Medicine

## 2020-05-26 ENCOUNTER — Encounter: Payer: Self-pay | Admitting: *Deleted

## 2020-05-26 ENCOUNTER — Other Ambulatory Visit: Payer: Self-pay

## 2020-05-26 DIAGNOSIS — Z794 Long term (current) use of insulin: Secondary | ICD-10-CM | POA: Insufficient documentation

## 2020-05-26 DIAGNOSIS — N289 Disorder of kidney and ureter, unspecified: Secondary | ICD-10-CM | POA: Insufficient documentation

## 2020-05-26 DIAGNOSIS — E1142 Type 2 diabetes mellitus with diabetic polyneuropathy: Secondary | ICD-10-CM | POA: Insufficient documentation

## 2020-05-26 DIAGNOSIS — E2740 Unspecified adrenocortical insufficiency: Secondary | ICD-10-CM

## 2020-05-26 DIAGNOSIS — E860 Dehydration: Secondary | ICD-10-CM

## 2020-05-26 DIAGNOSIS — Z87891 Personal history of nicotine dependence: Secondary | ICD-10-CM | POA: Insufficient documentation

## 2020-05-26 DIAGNOSIS — I959 Hypotension, unspecified: Secondary | ICD-10-CM | POA: Insufficient documentation

## 2020-05-26 LAB — CBC WITH DIFFERENTIAL/PLATELET
Abs Immature Granulocytes: 0.03 10*3/uL (ref 0.00–0.07)
Basophils Absolute: 0 10*3/uL (ref 0.0–0.1)
Basophils Relative: 0 %
Eosinophils Absolute: 0.1 10*3/uL (ref 0.0–0.5)
Eosinophils Relative: 2 %
HCT: 35.8 % — ABNORMAL LOW (ref 36.0–46.0)
Hemoglobin: 11.9 g/dL — ABNORMAL LOW (ref 12.0–15.0)
Immature Granulocytes: 0 %
Lymphocytes Relative: 28 %
Lymphs Abs: 1.9 10*3/uL (ref 0.7–4.0)
MCH: 30.8 pg (ref 26.0–34.0)
MCHC: 33.2 g/dL (ref 30.0–36.0)
MCV: 92.7 fL (ref 80.0–100.0)
Monocytes Absolute: 0.6 10*3/uL (ref 0.1–1.0)
Monocytes Relative: 8 %
Neutro Abs: 4.1 10*3/uL (ref 1.7–7.7)
Neutrophils Relative %: 62 %
Platelets: 203 10*3/uL (ref 150–400)
RBC: 3.86 MIL/uL — ABNORMAL LOW (ref 3.87–5.11)
RDW: 11.8 % (ref 11.5–15.5)
WBC: 6.7 10*3/uL (ref 4.0–10.5)
nRBC: 0 % (ref 0.0–0.2)

## 2020-05-26 LAB — COMPREHENSIVE METABOLIC PANEL
ALT: 22 U/L (ref 0–44)
AST: 25 U/L (ref 15–41)
Albumin: 3.2 g/dL — ABNORMAL LOW (ref 3.5–5.0)
Alkaline Phosphatase: 170 U/L — ABNORMAL HIGH (ref 38–126)
Anion gap: 8 (ref 5–15)
BUN: 16 mg/dL (ref 6–20)
CO2: 26 mmol/L (ref 22–32)
Calcium: 8.8 mg/dL — ABNORMAL LOW (ref 8.9–10.3)
Chloride: 98 mmol/L (ref 98–111)
Creatinine, Ser: 1.22 mg/dL — ABNORMAL HIGH (ref 0.44–1.00)
GFR, Estimated: 51 mL/min — ABNORMAL LOW (ref >60)
Glucose, Bld: 496 mg/dL — ABNORMAL HIGH (ref 70–99)
Potassium: 4.7 mmol/L (ref 3.5–5.1)
Sodium: 132 mmol/L — ABNORMAL LOW (ref 135–145)
Total Bilirubin: 0.5 mg/dL (ref 0.3–1.2)
Total Protein: 6.4 g/dL — ABNORMAL LOW (ref 6.5–8.1)

## 2020-05-26 LAB — CBG MONITORING, ED: Glucose-Capillary: 459 mg/dL — ABNORMAL HIGH (ref 70–99)

## 2020-05-26 LAB — BLOOD GAS, VENOUS

## 2020-05-26 LAB — TROPONIN I (HIGH SENSITIVITY): Troponin I (High Sensitivity): 6 ng/L (ref <18)

## 2020-05-26 MED ORDER — LACTATED RINGERS IV BOLUS
1000.0000 mL | Freq: Once | INTRAVENOUS | Status: AC
  Administered 2020-05-26: 1000 mL via INTRAVENOUS

## 2020-05-26 MED ORDER — HYDROCORTISONE NA SUCCINATE PF 100 MG IJ SOLR
100.0000 mg | Freq: Once | INTRAMUSCULAR | Status: AC
  Administered 2020-05-26: 100 mg via INTRAVENOUS
  Filled 2020-05-26: qty 2

## 2020-05-26 NOTE — ED Notes (Signed)
Patient transported to X-ray 

## 2020-05-26 NOTE — ED Provider Notes (Signed)
 [facility] Emergency Department Provider Note  ____________________________________________  Time seen: Approximately 11:23 PM  I have reviewed the triage vital signs and the nursing notes.   HISTORY  Chief Complaint Hypotension and Hyperglycemia   HPI [name] [name] is a 59 y.o. female with a history of adrenal insufficiency, type 2 diabetes, chronic pain, polyneuropathy, autonomic dysfunction who presents for evaluation of fall.  Patient had 2 falls today.  She denies feeling dizzy but does complain feeling very weak.  Patient has a history of autonomic dysfunction and reports that she often has episodes like this is due to her blood pressure being very low.  She is supposed to be on fludrocortisone for her adrenal insufficiency but she is unable to afforded so she is not taking it.  She denies head trauma or LOC.  She denies lightheadedness, chest pain, shortness of breath, fever, chills, dysuria or hematuria, vomiting or diarrhea, abdominal pain, neck pain or back pain.  She reports that she called 911 because she usually does not fall more than once a day and she wanted to make sure everything was okay.  She did fall onto her right shoulder and right hip and reports mild pain from the fall that happened earlier today.  She has been able to bear weight and ambulate with no difficulty.  She denies any known exposures to Covid.  She is unvaccinated.  When EMS arrived patient's blood pressure was very low with systolics in the 60s.  Her sugar was also elevated in the 500s.  She endorses compliance with her insulin.   Past Medical History:  Diagnosis Date  . Cervical disc disorder with radiculopathy of cervical region   . Chronic low back pain    HNP  . Chronic pain syndrome   . Degenerative joint disease   . Depression dx 1997  . Diabetes mellitus, type 2 (HCC) 2011   No insulin  . Diabetic polyneuropathy (HCC)   . GERD (gastroesophageal reflux disease)   .  Glaucoma   . Headache(784.0)   . Herniated disc   . Hypertension    Lab 11/2011:  CXR, EKG, CBC, TSH, BMet, troponin-normal; lipid profile: 188, 131, 36, 126   . Lumbar herniated disc   . Non-compliance   . Pancreatic cyst    Endoscopic aspiration in 09/2009  . Pneumonia 08/02/2012  . Shingles   . 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 Torn meniscus   . Vertigo     Patient Active Problem List   Diagnosis Date Noted  . Dehydration   . Hyperglycemia   . Preoperative clearance 02/25/2020  . Fracture of medial malleolus, right, closed 02/24/2020  . Hyperglycemia due to type 2 diabetes mellitus (HCC) 02/24/2020  . Autonomic dysfunction 02/24/2020  . Fall 02/24/2020  . Adrenal insufficiency (HCC) 09/07/2019  . Recurrent syncope 09/06/2019  . Sinus tachycardia 09/06/2019  . Hypokalemia 09/06/2019  . Pulmonary nodules 09/06/2019  . Orthostatic hypotension   . Syncope and collapse 06/30/2019  . Hypotension 06/29/2019  . Chronic pain syndrome 08/24/2017  . Diabetic polyneuropathy associated with type 2 diabetes mellitus (HCC) 08/24/2017  . Restless leg syndrome 08/24/2017  . Vertigo 04/09/2016  . Acute sinusitis 06/27/2015  . Cervical disc disorder with radiculopathy of cervical region 02/20/2015  . Neck muscle spasm 10/23/2014  . Healthcare maintenance 10/23/2014  . Depression 11/25/2013  . Diabetes type 2, uncontrolled (HCC)     Past Surgical History:  Procedure Laterality Date  .   ABDOMINAL HYSTERECTOMY    . CESAREAN SECTION    . ORIF ANKLE FRACTURE Right 02/25/2020   Procedure: OPEN REDUCTION INTERNAL FIXATION (ORIF) ANKLE FRACTURE;  Surgeon: Bowers, James R, MD;  Location: [facility] ORS;  Service: Orthopedics;  Laterality: Right;    Prior to Admission medications   Medication Sig Start Date End Date Taking? Authorizing Provider  acetaminophen (TYLENOL) 500 MG tablet Take 2 tablets (1,000 mg total) by mouth every 6 (six) hours as needed for mild pain or headache  (pain). 07/02/19   Joseph, Preetha, MD  Blood Glucose Monitoring Suppl (TRUE METRIX METER) w/Device KIT Use as instructed to check blood sugar once daily. 04/26/20   Fulp, Cammie, MD  Cholecalciferol (VITAMIN D-3) 125 MCG (5000 UT) TABS Take 1 tablet by mouth daily. 01/24/20   Hilts, Michael, MD  diclofenac Sodium (VOLTAREN) 1 % GEL Apply 1 application topically 4 (four) times daily. 01/16/20   Stephens, Amy J, NP  DULoxetine (CYMBALTA) 60 MG capsule Take 1 capsule (60 mg total) by mouth 2 (two) times daily. 04/23/20   Lord, Jamison Y, NP  feeding supplement, GLUCERNA SHAKE, (GLUCERNA SHAKE) LIQD Take 237 mLs by mouth daily. 02/27/20   Amin, Sumayya, MD  fludrocortisone (FLORINEF) 0.1 MG tablet Take 1 tablet (0.1 mg total) by mouth daily. 05/27/20   Veronese, [place], MD  gabapentin (NEURONTIN) 600 MG tablet TAKE ONE TABLET (600 MG) BY MOUTH EVERY MORNING AND AFTERNOON, TAKE TWO TABLETS AT NIGHT 04/23/20   Lord, Jamison Y, NP  Glucosamine Sulfate 1000 MG CAPS Take 1 capsule (1,000 mg total) by mouth 2 (two) times daily. 01/24/20   Hilts, Michael, MD  glucose blood (TRUE METRIX BLOOD GLUCOSE TEST) test strip Use as instructed to check blood sugar once daily. 04/26/20   Fulp, Cammie, MD  HUMALOG KWIKPEN 100 UNIT/ML KwikPen Inject 18 Units into the skin 3 (three) times daily. 04/04/20   McClung, Angela M, PA-C  insulin glargine (LANTUS SOLOSTAR) 100 UNIT/ML Solostar Pen Inject 45 Units into the skin daily. 04/04/20   McClung, Angela M, PA-C  Insulin Pen Needle (PEN NEEDLES) 31G X 8 MM MISC Use as directed 09/07/19   Thompson, Daniel V, MD  meloxicam (MOBIC) 15 MG tablet Take 0.5-1 tablets (7.5-15 mg total) by mouth daily as needed for pain. 01/24/20   Hilts, Michael, MD  Turmeric 500 MG CAPS Take 500 mg by mouth 2 (two) times daily. Patient not taking: Reported on 04/04/2020 01/24/20   Hilts, Michael, MD    Allergies Tramadol  Family History  Problem Relation Age of Onset  . Depression Mother        type 1  .  Diabetes Mother   . Renal Disease Mother   . Lung cancer Father   . Heart attack Brother        Died age 35 - was told due to massive MI/heart "exploded"  . Heart failure Maternal Grandmother        Details not totally clear    Social History Social History   Tobacco Use  . Smoking status: Former Smoker    Quit date: 08/03/1978    Years since quitting: 41.8  . Smokeless tobacco: Never Used  . Tobacco comment: Smoked age [age]  Substance Use Topics  . Alcohol use: No    Comment: Former  . Drug use: No    Review of Systems  Constitutional: Negative for fever. + generalized weakness Eyes: Negative for visual changes. ENT: Negative for sore throat. Neck: No neck pain    Cardiovascular: Negative for chest pain. Respiratory: Negative for shortness of breath. Gastrointestinal: Negative for abdominal pain, vomiting or diarrhea. Genitourinary: Negative for dysuria. Musculoskeletal: Negative for back pain. + R shoulder and R hip pain Skin: Negative for rash. Neurological: Negative for headaches, weakness or numbness. Psych: No SI or HI  ____________________________________________   PHYSICAL EXAM:  VITAL SIGNS:  Vitals:   05/27/20 0400 05/27/20 0419  BP: 134/80   Pulse: (!) 122   Resp: (!) 21   Temp:  97.8 F (36.6 C)  SpO2: 100%      Constitutional: Alert and oriented. Well appearing and in no apparent distress. HEENT:      Head: Normocephalic and atraumatic.         Eyes: Conjunctivae are normal. Sclera is non-icteric.       Mouth/Throat: Mucous membranes are dry.       Neck: Supple with no signs of meningismus. Cardiovascular: Regular rate and rhythm. No murmurs, gallops, or rubs. 2+ symmetrical distal pulses are present in all extremities. Respiratory: Normal respiratory effort. Lungs are clear to auscultation bilaterally. Gastrointestinal: Soft, non tender, and non distended. Musculoskeletal: Nontender with normal range of motion in all extremities. No edema,  cyanosis, or erythema of extremities. Neurologic: Normal speech and language. Face is symmetric.  Intact strength and sensation x4 Skin: Skin is warm, dry and intact. No rash noted. Psychiatric: Mood and affect are normal. Speech and behavior are normal.  ____________________________________________   LABS (all labs ordered are listed, but only abnormal results are displayed)  Labs Reviewed  CBC WITH DIFFERENTIAL/PLATELET - Abnormal; Notable for the following components:      Result Value   RBC 3.86 (*)    Hemoglobin 11.9 (*)    HCT 35.8 (*)    All other components within normal limits  COMPREHENSIVE METABOLIC PANEL - Abnormal; Notable for the following components:   Sodium 132 (*)    Glucose, Bld 496 (*)    Creatinine, Ser 1.22 (*)    Calcium 8.8 (*)    Total Protein 6.4 (*)    Albumin 3.2 (*)    Alkaline Phosphatase 170 (*)    GFR, Estimated 51 (*)    All other components within normal limits  CBG MONITORING, ED - Abnormal; Notable for the following components:   Glucose-Capillary 459 (*)    All other components within normal limits  CBG MONITORING, ED - Abnormal; Notable for the following components:   Glucose-Capillary 356 (*)    All other components within normal limits  CBG MONITORING, ED - Abnormal; Notable for the following components:   Glucose-Capillary 347 (*)    All other components within normal limits  BLOOD GAS, VENOUS  URINALYSIS, COMPLETE (UACMP) WITH MICROSCOPIC  TROPONIN I (HIGH SENSITIVITY)  TROPONIN I (HIGH SENSITIVITY)   ____________________________________________  EKG  ED ECG REPORT I, Rudene Re, the attending physician, personally viewed and interpreted this ECG.  Normal sinus rhythm, rate of 82, normal intervals, right axis deviation, no ST elevations or depressions.  Unchanged from prior from a month ago. ____________________________________________  RADIOLOGY  I have personally reviewed the images performed during this visit and  I agree with the Radiologist's read.   Interpretation by Radiologist:  DG Shoulder Right  Result Date: 05/27/2020 CLINICAL DATA:  Fall, hypotensive, hyperglycemia EXAM: RIGHT SHOULDER - 2+ VIEW COMPARISON:  None. FINDINGS: Transcortical lucency through the inferior scapular body seen on the scapular Y-view. Could reflect a nondisplaced fracture or prominent vascular channel. No other acute fracture or traumatic  malalignment. Mild glenohumeral and acromioclavicular arthrosis with the humeral head normally seated. No other acute abnormality of the chest wall or shoulder. Telemetry leads overlie the chest. IMPRESSION: 1. Transcortical lucency through the inferior scapular body seen on the scapular Y-view. Could reflect a nondisplaced fracture or prominent vascular channel. Correlate with point tenderness. 2. No other acute fracture or traumatic malalignment. Electronically Signed   By: Lovena Le M.D.   On: 05/27/2020 00:24   DG Hip Unilat W or Wo Pelvis 2-3 Views Right  Result Date: 05/27/2020 CLINICAL DATA:  Fall EXAM: DG HIP (WITH OR WITHOUT PELVIS) 2-3V RIGHT COMPARISON:  None. FINDINGS: Bones of the pelvis are intact and congruent. Arcuate lines are contiguous. Proximal femora are intact and the femoral heads are normally located within the acetabula. Mild degenerative changes in the lower lumbosacral spine. Additional mild degenerative changes in the bilateral hips and SI joints. Vascular calcium in the pelvis. Mild lateral soft tissue swelling/contusive change of the left hip. No other acute soft tissue abnormality. IMPRESSION: 1. No acute fracture or traumatic malalignment. 2. Mild lateral soft tissue swelling/contusive change of the left hip. Electronically Signed   By: Lovena Le M.D.   On: 05/27/2020 00:28      ____________________________________________   PROCEDURES  Procedure(s) performed:yes  .1-3 Lead EKG Interpretation Performed by: Rudene Re, MD Authorized by:  Rudene Re, MD     Interpretation: normal     ECG rate assessment: normal     Rhythm: sinus rhythm     Ectopy: none     Critical Care performed:  None ____________________________________________   INITIAL IMPRESSION / ASSESSMENT AND PLAN / ED COURSE   59 y.o. female with a history of adrenal insufficiency, type 2 diabetes, chronic pain, polyneuropathy, autonomic dysfunction who presents for evaluation of fall x 2 today due to generalized weakness.  Patient is supposed to be on steroids for her adrenal insufficiency but she is unable to afford.  Has had several similar episodes of pretty significant hypotension leading to weakness and fall.  She denies any syncope at home.  On exam no signs of traumatic injury but patient is complaining of mild right shoulder and hip pain therefore x-rays have been ordered.  No head trauma, no LOC, head is atraumatic, no CT and L-spine tenderness, she is not on blood thinners.  Initial blood pressure on arrival with systolics in the 59F.  Afebrile with no tachycardia.  Per history no signs of infectious etiology.  Differential diagnoses including adrenal insufficiency, autonomic dysfunction, DKA, dehydration, sepsis.  No signs of stroke on exam.  We will start with hydration with 2 L of LR, will give patient a dose of 100 mg of hydrocortisone.  Blood work including CBC, CMP, urinalysis, VBG, troponin are pending.  EKG with no signs of ischemia or dysrhythmias.  Old medical records reviewed including most recent PCP visit a month ago where the note states that patient is noncompliant with her insulin regimen.  _________________________ 4:50 AM on 05/27/2020 ----------------------------------------- Blood work consistent with dehydration, mildly elevated creatinine, hyperglycemia with no evidence of DKA with a normal VBG. Blood glucose improved after IV fluids and so did patient's blood pressure after fluids and hydrocortisone. I explained to the patient  the importance of taking her steroids which only cost $10 for a month supply. Patient cried and said that she hates to ask her son for money because he already helps her a lot. I explained to her that without this medication she is going  to be in and out of the hospital because of hypotension and possible orthostatic syncope causing pretty serious injuries. Since receiving the steroids her heart rate slowly went up from 80s to 120. Sinus tachycardia common side effect of the steroids. BP has been normal. XRs were visualized with no acute traumatic injury, radiology reviewed and raised suspicion for a possible scapular fracture vs prominent vasculature. Patient has no tenderness to palpation or any signs of trauma in that location.     _____________________________________________ Please note:  Patient was evaluated in Emergency Department today for the symptoms described in the history of present illness. Patient was evaluated in the context of the global COVID-19 pandemic, which necessitated consideration that the patient might be at risk for infection with the SARS-CoV-2 virus that causes COVID-19. Institutional protocols and algorithms that pertain to the evaluation of patients at risk for COVID-19 are in a state of rapid change based on information released by regulatory bodies including the CDC and federal and state organizations. These policies and algorithms were followed during the patient's care in the ED.  Some ED evaluations and interventions may be delayed as a result of limited staffing during the pandemic.   [place] Controlled Substance Database was reviewed by me. ____________________________________________   FINAL CLINICAL IMPRESSION(S) / ED DIAGNOSES   Final diagnoses:  Adrenal insufficiency (HCC)  Hypotension, unspecified hypotension type  Dehydration      NEW MEDICATIONS STARTED DURING THIS VISIT:  ED Discharge Orders         Ordered    fludrocortisone (FLORINEF) 0.1 MG  tablet  Daily        05/27/20 0446           Note:  This document was prepared using Dragon voice recognition software and may include unintentional dictation errors.    Alfred Levins, [place], MD 05/27/20 [phone]

## 2020-05-26 NOTE — ED Triage Notes (Signed)
Pt arrives from home with hypotension and hyperglycemia.  Pt is alert and oriented, 18 IV in left AC, 500cc NS from ems pta.  Bloodwork sent and medications and fluids given.

## 2020-05-27 ENCOUNTER — Encounter: Payer: Self-pay | Admitting: Radiology

## 2020-05-27 LAB — TROPONIN I (HIGH SENSITIVITY): Troponin I (High Sensitivity): 6 ng/L (ref <18)

## 2020-05-27 LAB — BLOOD GAS, VENOUS
Acid-Base Excess: 1.3 mmol/L (ref 0.0–2.0)
Bicarbonate: 27.6 mmol/L (ref 20.0–28.0)
O2 Saturation: 21.6 %
Patient temperature: 37
pCO2, Ven: 50 mmHg (ref 44.0–60.0)
pH, Ven: 7.35 (ref 7.250–7.430)

## 2020-05-27 LAB — CBG MONITORING, ED
Glucose-Capillary: 356 mg/dL — ABNORMAL HIGH (ref 70–99)
Glucose-Capillary: 347 mg/dL — ABNORMAL HIGH (ref 70–99)

## 2020-05-27 MED ORDER — LACTATED RINGERS IV BOLUS
1000.0000 mL | Freq: Once | INTRAVENOUS | Status: AC
  Administered 2020-05-27: 1000 mL via INTRAVENOUS

## 2020-05-27 MED ORDER — FLUDROCORTISONE ACETATE 0.1 MG PO TABS
0.1000 mg | ORAL_TABLET | Freq: Every day | ORAL | 2 refills | Status: DC
Start: 2020-05-27 — End: 2020-08-12

## 2020-05-27 NOTE — Discharge Instructions (Addendum)
As explained to you it is very important that you take your daily dose of Florinef to prevent your blood pressure to become so low. While on this medication, your sugar can get high. Therefore it is important that you continue to check her sugars at home and use her sliding scale as prescribed. Continue to drink plenty of fluids with electrolyte at home follow-up with your primary care doctor in 2 days.

## 2020-05-27 NOTE — ED Notes (Signed)
Pt BP cuff had been removed, pt is still and states that she feels very sleepy.  BP improved

## 2020-05-29 ENCOUNTER — Other Ambulatory Visit: Payer: Self-pay

## 2020-05-29 ENCOUNTER — Ambulatory Visit: Payer: Medicaid Other | Attending: Nurse Practitioner | Admitting: Nurse Practitioner

## 2020-05-29 DIAGNOSIS — E11649 Type 2 diabetes mellitus with hypoglycemia without coma: Secondary | ICD-10-CM | POA: Insufficient documentation

## 2020-05-29 DIAGNOSIS — Z79899 Other long term (current) drug therapy: Secondary | ICD-10-CM | POA: Insufficient documentation

## 2020-05-29 DIAGNOSIS — Z87891 Personal history of nicotine dependence: Secondary | ICD-10-CM | POA: Insufficient documentation

## 2020-05-29 DIAGNOSIS — Z794 Long term (current) use of insulin: Secondary | ICD-10-CM

## 2020-05-29 DIAGNOSIS — Z833 Family history of diabetes mellitus: Secondary | ICD-10-CM | POA: Insufficient documentation

## 2020-05-29 DIAGNOSIS — E1165 Type 2 diabetes mellitus with hyperglycemia: Secondary | ICD-10-CM | POA: Insufficient documentation

## 2020-05-29 DIAGNOSIS — Z09 Encounter for follow-up examination after completed treatment for conditions other than malignant neoplasm: Secondary | ICD-10-CM | POA: Insufficient documentation

## 2020-05-29 DIAGNOSIS — E1143 Type 2 diabetes mellitus with diabetic autonomic (poly)neuropathy: Secondary | ICD-10-CM | POA: Insufficient documentation

## 2020-06-02 ENCOUNTER — Other Ambulatory Visit: Payer: Self-pay | Admitting: Nurse Practitioner

## 2020-06-02 ENCOUNTER — Emergency Department
Admission: EM | Admit: 2020-06-02 | Discharge: 2020-06-02 | Disposition: A | Discharge status: Home / Self Care | Payer: Self-pay | Attending: Emergency Medicine | Admitting: Emergency Medicine

## 2020-06-02 ENCOUNTER — Emergency Department: Payer: Self-pay

## 2020-06-02 ENCOUNTER — Other Ambulatory Visit: Payer: Self-pay

## 2020-06-02 ENCOUNTER — Encounter: Payer: Self-pay | Admitting: Nurse Practitioner

## 2020-06-02 ENCOUNTER — Encounter: Payer: Self-pay | Admitting: Emergency Medicine

## 2020-06-02 DIAGNOSIS — E119 Type 2 diabetes mellitus without complications: Secondary | ICD-10-CM | POA: Insufficient documentation

## 2020-06-02 DIAGNOSIS — S92415A Nondisplaced fracture of proximal phalanx of left great toe, initial encounter for closed fracture: Secondary | ICD-10-CM

## 2020-06-02 DIAGNOSIS — I10 Essential (primary) hypertension: Secondary | ICD-10-CM | POA: Insufficient documentation

## 2020-06-02 DIAGNOSIS — W1849XA Other slipping, tripping and stumbling without falling, initial encounter: Secondary | ICD-10-CM | POA: Insufficient documentation

## 2020-06-02 DIAGNOSIS — Z87891 Personal history of nicotine dependence: Secondary | ICD-10-CM | POA: Insufficient documentation

## 2020-06-02 DIAGNOSIS — Y9301 Activity, walking, marching and hiking: Secondary | ICD-10-CM | POA: Insufficient documentation

## 2020-06-02 DIAGNOSIS — S93602A Unspecified sprain of left foot, initial encounter: Secondary | ICD-10-CM | POA: Insufficient documentation

## 2020-06-02 DIAGNOSIS — Z794 Long term (current) use of insulin: Secondary | ICD-10-CM | POA: Insufficient documentation

## 2020-06-02 MED ORDER — HYDROCODONE-ACETAMINOPHEN 5-325 MG PO TABS
1.0000 | ORAL_TABLET | Freq: Once | ORAL | Status: AC
  Administered 2020-06-02: 1 via ORAL
  Filled 2020-06-02: qty 1

## 2020-06-02 MED ORDER — ATORVASTATIN CALCIUM 20 MG PO TABS: 20.0000 mg | ORAL_TABLET | Freq: Every day | ORAL | 3 refills | Status: DC

## 2020-06-02 MED ORDER — HYDROCODONE-ACETAMINOPHEN 5-325 MG PO TABS: 1.0000 | ORAL_TABLET | Freq: Three times a day (TID) | ORAL | 0 refills | Status: AC | PRN

## 2020-06-02 MED ORDER — ONDANSETRON 4 MG PO TBDP
4.0000 mg | ORAL_TABLET | Freq: Once | ORAL | Status: AC
  Administered 2020-06-02: 4 mg via ORAL
  Filled 2020-06-02: qty 1

## 2020-06-02 NOTE — ED Provider Notes (Signed)
[facility] Emergency Department Provider Note ____________________________________________  Time seen: 1212  I have reviewed the triage vital signs and the nursing notes.  HISTORY  Chief Complaint  Toe Pain  HPI [name] [name] is a 59 y.o. female presents to the ED for evaluation of pain to the left great toe.  Patient describes an injury that occurred yesterday when she was walking outside.   She describes tripping over her feet, but denies outright fall patient is at times has had increasing pain, swelling, disability to the left great toe but she denies any other injury at that time.  Past Medical History:  Diagnosis Date  . Cervical disc disorder with radiculopathy of cervical region   . Chronic low back pain    HNP  . Chronic pain syndrome   . Degenerative joint disease   . Depression dx 1997  . Diabetes mellitus, type 2 ([place]) 2011   No insulin  . Diabetic polyneuropathy ([place])   . GERD (gastroesophageal reflux disease)   . Glaucoma   . Headache(784.0)   . Herniated disc   . Hypertension    Lab 11/2011:  CXR, EKG, CBC, TSH, BMet, troponin-normal; lipid profile: 188, 131, 36, 126   . Lumbar herniated disc   . Non-compliance   . Pancreatic cyst    Endoscopic aspiration in 09/2009  . Pneumonia 08/02/2012  . Shingles   . 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 Torn meniscus   . Vertigo     Patient Active Problem List   Diagnosis Date Noted  . Dehydration   . Hyperglycemia   . Preoperative clearance 02/25/2020  . Fracture of medial malleolus, right, closed 02/24/2020  . Hyperglycemia due to type 2 diabetes mellitus ([place]) 02/24/2020  . Autonomic dysfunction 02/24/2020  . Fall 02/24/2020  . Adrenal insufficiency ([place]) 09/07/2019  . Recurrent syncope 09/06/2019  . Sinus tachycardia 09/06/2019  . Hypokalemia 09/06/2019  . Pulmonary nodules 09/06/2019  . Orthostatic hypotension   . Syncope and collapse 06/30/2019  . Hypotension  06/29/2019  . Chronic pain syndrome 08/24/2017  . Diabetic polyneuropathy associated with type 2 diabetes mellitus ([place]) 08/24/2017  . Restless leg syndrome 08/24/2017  . Vertigo 04/09/2016  . Acute sinusitis 06/27/2015  . Cervical disc disorder with radiculopathy of cervical region 02/20/2015  . Neck muscle spasm 10/23/2014  . Healthcare maintenance 10/23/2014  . Depression 11/25/2013  . Diabetes type 2, uncontrolled ([place])     Past Surgical History:  Procedure Laterality Date  . ABDOMINAL HYSTERECTOMY    . CESAREAN SECTION    . ORIF ANKLE FRACTURE Right 02/25/2020   Procedure: OPEN REDUCTION INTERNAL FIXATION (ORIF) ANKLE FRACTURE;  Surgeon: Lovell Sheehan, MD;  Location: [facility] ORS;  Service: Orthopedics;  Laterality: Right;    Prior to Admission medications   Medication Sig Start Date End Date Taking? Authorizing Provider  acetaminophen (TYLENOL) 500 MG tablet Take 2 tablets (1,000 mg total) by mouth every 6 (six) hours as needed for mild pain or headache (pain). 07/02/19   Domenic Polite, MD  Blood Glucose Monitoring Suppl (TRUE METRIX METER) w/Device KIT Use as instructed to check blood sugar once daily. 04/26/20   Fulp, Cammie, MD  Cholecalciferol (VITAMIN D-3) 125 MCG (5000 UT) TABS Take 1 tablet by mouth daily. 01/24/20   Hilts, Legrand Como, MD  diclofenac Sodium (VOLTAREN) 1 % GEL Apply 1 application topically 4 (four) times daily. 01/16/20   Camillia Herter, NP  DULoxetine (CYMBALTA) 60  MG capsule Take 1 capsule (60 mg total) by mouth 2 (two) times daily. 04/23/20   Patrecia Pour, NP  feeding supplement, GLUCERNA SHAKE, (GLUCERNA SHAKE) LIQD Take 237 mLs by mouth daily. 02/27/20   Lorella Nimrod, MD  fludrocortisone (FLORINEF) 0.1 MG tablet Take 1 tablet (0.1 mg total) by mouth daily. Patient not taking: Reported on 05/29/2020 05/27/20   Rudene Re, MD  gabapentin (NEURONTIN) 600 MG tablet TAKE ONE TABLET (600 MG) BY MOUTH EVERY MORNING AND AFTERNOON, TAKE TWO TABLETS AT NIGHT  04/23/20   Patrecia Pour, NP  Glucosamine Sulfate 1000 MG CAPS Take 1 capsule (1,000 mg total) by mouth 2 (two) times daily. 01/24/20   Hilts, Legrand Como, MD  glucose blood (TRUE METRIX BLOOD GLUCOSE TEST) test strip Use as instructed to check blood sugar once daily. 04/26/20   Fulp, Cammie, MD  HUMALOG KWIKPEN 100 UNIT/ML KwikPen Inject 18 Units into the skin 3 (three) times daily. Patient taking differently: Inject 25 Units into the skin 3 (three) times daily.  04/04/20   Argentina Donovan, PA-C  HYDROcodone-acetaminophen (NORCO) 5-325 MG tablet Take 1 tablet by mouth 3 (three) times daily as needed for up to 3 days. 06/02/20 06/05/20  Cleave Ternes, Dannielle Karvonen, PA-C  insulin glargine (LANTUS SOLOSTAR) 100 UNIT/ML Solostar Pen Inject 45 Units into the skin daily. Patient taking differently: Inject 50 Units into the skin daily.  04/04/20   Argentina Donovan, PA-C  Insulin Pen Needle (PEN NEEDLES) 31G X 8 MM MISC Use as directed 09/07/19   Eugenie Filler, MD  meloxicam (MOBIC) 15 MG tablet Take 0.5-1 tablets (7.5-15 mg total) by mouth daily as needed for pain. 01/24/20   Hilts, Legrand Como, MD  Turmeric 500 MG CAPS Take 500 mg by mouth 2 (two) times daily. Patient not taking: Reported on 04/04/2020 01/24/20   Hilts, Legrand Como, MD    Allergies Tramadol  Family History  Problem Relation Age of Onset  . Depression Mother        type 1  . Diabetes Mother   . Renal Disease Mother   . Lung cancer Father   . Heart attack Brother        Died age 47 - was told due to massive MI/heart "exploded"  . Heart failure Maternal Grandmother        Details not totally clear    Social History Social History   Tobacco Use  . Smoking status: Former Smoker    Quit date: 08/03/1978    Years since quitting: 41.8  . Smokeless tobacco: Never Used  . Tobacco comment: Smoked age [age]  Substance Use Topics  . Alcohol use: No    Comment: Former  . Drug use: No    Review of Systems  Constitutional: Negative for  fever. Cardiovascular: Negative for chest pain. Respiratory: Negative for shortness of breath. Gastrointestinal: Negative for abdominal pain, vomiting and diarrhea. Genitourinary: Negative for dysuria. Musculoskeletal: Negative for back pain.  Left great toe pain as above. Skin: Negative for rash. Neurological: Negative for headaches, focal weakness or numbness. ____________________________________________  PHYSICAL EXAM:  VITAL SIGNS: ED Triage Vitals  Enc Vitals Group     BP 06/02/20 1212 (!) 147/98     Pulse Rate 06/02/20 1212 (!) 108     Resp 06/02/20 1212 20     Temp 06/02/20 1212 98.4 F (36.9 C)     Temp Source 06/02/20 1212 Oral     SpO2 06/02/20 1212 98 %     Weight  06/02/20 1201 140 lb (63.5 kg)     Height 06/02/20 1201 5' 6"  (1.676 m)     Head Circumference --      Peak Flow --      Pain Score 06/02/20 1200 5     Pain Loc --      Pain Edu? --      Excl. in [place]? --     Constitutional: Alert and oriented. Well appearing and in no distress. Head: Normocephalic and atraumatic. Eyes: Conjunctivae are normal. Normal extraocular movements Cardiovascular: Normal rate, regular rhythm. Normal distal pulses. Respiratory: Normal respiratory effort. No wheezes/rales/rhonchi. Musculoskeletal: Right foot and great toe without obvious deformity or dislocation.  Great toe does have some moderate erythema and edema concerning for underlying soft tissue injury.  Patient is exquisitely tender to palpation to the lateral proximal phalanx.  No nail injury or subungual hematomas noted.  Nontender with normal range of motion in all extremities.  Neurologic:  Normal gait without ataxia. Normal speech and language. No gross focal neurologic deficits are appreciated. Skin:  Skin is warm, dry and intact. No rash noted. ____________________________________________   RADIOLOGY  DG Left Great Toe  Questionable lateral great toe deformity at the proximal phalanx  I, Jovee Dettinger V Bacon-Patton Swisher,  personally viewed and evaluated these images (plain radiographs) as part of my medical decision making, as well as reviewing the written report by the radiologist. ____________________________________________  PROCEDURES  Zofran 4 mg ODT Norco 5-325 mg p.o. Postop shoe  Procedures ____________________________________________  INITIAL IMPRESSION / ASSESSMENT AND PLAN / ED COURSE  Patient ED evaluation of mechanical injury to the left foot and great toe resulting in increased pain, swelling, disability.  X-ray images reviewed by me do show a possible defect of the proximal phalanx on the AP view.  Radiologist read images is negative overall.  Patient will be treated with a suspicion for close fracture to the left great toe patient is placed in a postop shoe for comfort.  She is discharged with a prescription for hydrocodone to take as needed.  She will follow-up with Dr. Harlow Mares who recently managed her right ankle ORIF or podiatry.  [name] [name] was evaluated in Emergency Department on 06/02/2020 for the symptoms described in the history of present illness. She was evaluated in the context of the global COVID-19 pandemic, which necessitated consideration that the patient might be at risk for infection with the SARS-CoV-2 virus that causes COVID-19. Institutional protocols and algorithms that pertain to the evaluation of patients at risk for COVID-19 are in a state of rapid change based on information released by regulatory bodies including the CDC and federal and state organizations. These policies and algorithms were followed during the patient's care in the ED.  I reviewed the patient's prescription history over the last 12 months in the multi-state controlled substances database(s) that includes [place], [place], [place], [place], [place], [place], [place], [place], [place], [place], [place], [place], [place], and [place].  Results were notable for no current  RX. ____________________________________________  FINAL CLINICAL IMPRESSION(S) / ED DIAGNOSES  Final diagnoses:  Closed nondisplaced fracture of proximal phalanx of left great toe, initial encounter  Foot sprain, left, initial encounter      Melvenia Needles, PA-C 06/02/20 1901    Naaman Plummer, MD 06/05/20 [phone]

## 2020-06-02 NOTE — Progress Notes (Signed)
Virtual Visit via Telephone Note Due to national recommendations of social distancing due to [place] 19, telehealth visit is felt to be most appropriate for this patient at this time.  I discussed the limitations, risks, security and privacy concerns of performing an evaluation and management service by telephone and the availability of in person appointments. I also discussed with the patient that there may be a patient responsible charge related to this service. The patient expressed understanding and agreed to proceed.    I connected with [name] [name] on 05/29/20  at   9:30 AM EST  EDT by telephone and verified that I am speaking with the correct person using two identifiers.   Consent I discussed the limitations, risks, security and privacy concerns of performing an evaluation and management service by telephone and the availability of in person appointments. I also discussed with the patient that there may be a patient responsible charge related to this service. The patient expressed understanding and agreed to proceed.   Location of Patient: Private Residence    Location of Provider: [facility] [employer] Office    Persons participating in Telemedicine visit: [name] Rankins FNP-BC [place]    History of Present Illness: Telemedicine visit for: HFU   She has a history of adrenal insufficiency, DM 2 with poly neuropathy, chronic pain, autonomic dysfunction.  She was evaluated in the ED on 05-26-2020 after having 2 falls that day. She states that her autonomic dysfunction causes her blood pressure to drop and results in weakness and falls. In the ED her SBP was in the 60s and CBG 500s. She was hydrated with IVF and given hydrocortisone. EKG normal. Neg for DKA. She had not been taking her fludrocortisone.   Today she states she has still not picked up her medication. Depressed about her financial situation, not being able to work and her medical conditions.  She is not taking her insulin and is aware of the complications that can occur with DKA if her blood glucose levels get too high. She is taking gabapentin 600 mg TID and cymbalta 60 mg BID for anxiety and depression.   DM TYPE 2 She has increased her humalog from 15 to 25 units TID and  lantus from 45- 50 units daily however she is not administering her insulin every day as prescribed. I added atorvastatin 20 mg daily.  Lab Results  Component Value Date   HGBA1C 14.8 (H) 02/24/2020      Past Medical History:  Diagnosis Date  . Cervical disc disorder with radiculopathy of cervical region   . Chronic low back pain    HNP  . Chronic pain syndrome   . Degenerative joint disease   . Depression dx 1997  . Diabetes mellitus, type 2 ([place]) 2011   No insulin  . Diabetic polyneuropathy ([place])   . GERD (gastroesophageal reflux disease)   . Glaucoma   . Headache(784.0)   . Herniated disc   . Hypertension    Lab 11/2011:  CXR, EKG, CBC, TSH, BMet, troponin-normal; lipid profile: 188, 131, 36, 126   . Lumbar herniated disc   . Non-compliance   . Pancreatic cyst    Endoscopic aspiration in 09/2009  . Pneumonia 08/02/2012  . Shingles   . 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 Torn meniscus   . Vertigo     Past Surgical History:  Procedure Laterality Date  . ABDOMINAL HYSTERECTOMY    .  CESAREAN SECTION    . ORIF ANKLE FRACTURE Right 02/25/2020   Procedure: OPEN REDUCTION INTERNAL FIXATION (ORIF) ANKLE FRACTURE;  Surgeon: Lovell Sheehan, MD;  Location: [facility] ORS;  Service: Orthopedics;  Laterality: Right;    Family History  Problem Relation Age of Onset  . Depression Mother        type 1  . Diabetes Mother   . Renal Disease Mother   . Lung cancer Father   . Heart attack Brother        Died age 89 - was told due to massive MI/heart "exploded"  . Heart failure Maternal Grandmother        Details not totally clear    Social History   Socioeconomic History  . Marital  status: Divorced    Spouse name: Not on file  . Number of children: Not on file  . Years of education: Not on file  . Highest education level: Not on file  Occupational History  . Not on file  Tobacco Use  . Smoking status: Former Smoker    Quit date: 08/03/1978    Years since quitting: 41.8  . Smokeless tobacco: Never Used  . Tobacco comment: Smoked age [age]  Substance and Sexual Activity  . Alcohol use: No    Comment: Former  . Drug use: No  . Sexual activity: Yes    Birth control/protection: Surgical  Other Topics Concern  . 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    Social Determinants of Health   Financial Resource Strain:   . Difficulty of Paying Living Expenses: Not on file  Food Insecurity:   . Worried About Charity fundraiser in the Last Year: Not on file  . Ran Out of Food in the Last Year: Not on file  Transportation Needs:   . Lack of Transportation (Medical): Not on file  . Lack of Transportation (Non-Medical): Not on file  Physical Activity:   . Days of Exercise per Week: Not on file  . Minutes of Exercise per Session: Not on file  Stress:   . Feeling of Stress : Not on file  Social Connections:   . Frequency of Communication with Friends and Family: Not on file  . Frequency of Social Gatherings with Friends and Family: Not on file  . Attends Religious Services: Not on file  . Active Member of Clubs or Organizations: Not on file  . Attends Archivist Meetings: Not on file  . Marital Status: Not on file     Observations/Objective: Awake, alert and oriented x 3   ROS  Assessment and Plan: Eva was seen today for follow-up.  Diagnoses and all orders for this visit:  Hospital discharge follow-up  Type 2 diabetes mellitus with diabetic autonomic neuropathy, with long-term current use of insulin (HCC) -     atorvastatin (LIPITOR) 20 MG tablet; Take 1 tablet (20 mg total) by mouth daily.  Continue blood sugar control as discussed in office today, low carbohydrate diet, and regular physical exercise as tolerated, 150 minutes per week (30 min each day, 5 days per week, or 50 min 3 days per week). Keep blood sugar logs with fasting goal of 90-130 mg/dl, post prandial (after you eat) less than 180.  For Hypoglycemia: BS <60 and Hyperglycemia BS >400; contact the clinic ASAP. Annual eye exams and foot exams are recommended.    Follow Up Instructions Return in about 4  weeks (around 06/26/2020) for DM .     I discussed the assessment and treatment plan with the patient. The patient was provided an opportunity to ask questions and all were answered. The patient agreed with the plan and demonstrated an understanding of the instructions.   The patient was advised to call back or seek an in-person evaluation if the symptoms worsen or if the condition fails to improve as anticipated.  I provided 18 minutes of non-face-to-face time during this encounter including median intraservice time, reviewing previous notes, labs, imaging, medications and explaining diagnosis and management.  Gildardo Pounds, FNP-BC

## 2020-06-02 NOTE — ED Notes (Signed)
Pt presents to the ED for L great toe pain. Pt said she stumbled yesterday but doesn't remember injury to that specific toe. On arrival, pt L great toe and red and swollen. Ambulatory to room. Pulse present. Denies numbness or tingling

## 2020-06-02 NOTE — Discharge Instructions (Signed)
I suspect you have a closed fracture of the great toe after injury.  You should wear the postop shoe with follow-up with Dr. Harlow Mares or podiatry for further evaluation of the injuries.

## 2020-06-02 NOTE — ED Triage Notes (Signed)
Pt reports pain and swelling to left great toe since yesterday. Pt denies obvious injuries, reports she did stump it other other day but it wasn't that bad

## 2020-06-03 MED FILL — GABAPENTIN 600 MG TABLET: 600 | 30 days supply | Qty: 120 | Fill #2

## 2020-06-03 MED FILL — ?ATORVASTATIN 20 MG TABLET: 20 | 30 days supply | Qty: 30 | Fill #0

## 2020-06-16 ENCOUNTER — Encounter: Payer: Self-pay | Admitting: Family Medicine

## 2020-07-02 ENCOUNTER — Telehealth: Payer: Self-pay

## 2020-07-02 MED FILL — ?DULOXetine HCL 60MG CAPS: 60 | 30 days supply | Qty: 60 | Fill #0

## 2020-07-02 NOTE — Telephone Encounter (Signed)
Called pt and LVM letting patient know her appointment for 12/22 would need to be rescheduled for a time later that day or the next day. Advised patient to call back to schedule. Or patient can go to mobile medicine unit.

## 2020-07-04 ENCOUNTER — Ambulatory Visit: Payer: Medicaid Other | Admitting: Physician Assistant

## 2020-07-04 ENCOUNTER — Ambulatory Visit: Payer: Medicaid Other | Admitting: Family Medicine

## 2020-07-09 MED FILL — GABAPENTIN 600 MG TABLET: 600 | 30 days supply | Qty: 120 | Fill #0

## 2020-07-10 ENCOUNTER — Ambulatory Visit: Payer: Medicaid Other | Admitting: Family Medicine

## 2020-07-23 ENCOUNTER — Telehealth (HOSPITAL_COMMUNITY): Payer: No Payment, Other

## 2020-07-23 ENCOUNTER — Other Ambulatory Visit: Payer: Self-pay

## 2020-07-28 ENCOUNTER — Other Ambulatory Visit: Payer: Self-pay

## 2020-07-28 ENCOUNTER — Inpatient Hospital Stay (HOSPITAL_COMMUNITY)
Admission: EM | Admit: 2020-07-28 | Discharge: 2020-08-12 | DRG: 177 | Disposition: A | Discharge status: Home Health | Payer: HRSA Program | Attending: Internal Medicine | Admitting: Internal Medicine

## 2020-07-28 ENCOUNTER — Emergency Department (HOSPITAL_COMMUNITY): Payer: HRSA Program

## 2020-07-28 DIAGNOSIS — E2740 Unspecified adrenocortical insufficiency: Secondary | ICD-10-CM | POA: Diagnosis present

## 2020-07-28 DIAGNOSIS — G894 Chronic pain syndrome: Secondary | ICD-10-CM | POA: Diagnosis present

## 2020-07-28 DIAGNOSIS — I10 Essential (primary) hypertension: Secondary | ICD-10-CM | POA: Diagnosis present

## 2020-07-28 DIAGNOSIS — U071 COVID-19: Secondary | ICD-10-CM | POA: Diagnosis not present

## 2020-07-28 DIAGNOSIS — J80 Acute respiratory distress syndrome: Secondary | ICD-10-CM | POA: Diagnosis present

## 2020-07-28 DIAGNOSIS — Z885 Allergy status to narcotic agent status: Secondary | ICD-10-CM

## 2020-07-28 DIAGNOSIS — E1110 Type 2 diabetes mellitus with ketoacidosis without coma: Secondary | ICD-10-CM | POA: Diagnosis not present

## 2020-07-28 DIAGNOSIS — Z87891 Personal history of nicotine dependence: Secondary | ICD-10-CM

## 2020-07-28 DIAGNOSIS — E1142 Type 2 diabetes mellitus with diabetic polyneuropathy: Secondary | ICD-10-CM | POA: Diagnosis present

## 2020-07-28 DIAGNOSIS — E1143 Type 2 diabetes mellitus with diabetic autonomic (poly)neuropathy: Secondary | ICD-10-CM

## 2020-07-28 DIAGNOSIS — M5010 Cervical disc disorder with radiculopathy, unspecified cervical region: Secondary | ICD-10-CM | POA: Diagnosis present

## 2020-07-28 DIAGNOSIS — J159 Unspecified bacterial pneumonia: Secondary | ICD-10-CM | POA: Diagnosis present

## 2020-07-28 DIAGNOSIS — Z90710 Acquired absence of both cervix and uterus: Secondary | ICD-10-CM

## 2020-07-28 DIAGNOSIS — Z9114 Patient's other noncompliance with medication regimen: Secondary | ICD-10-CM

## 2020-07-28 DIAGNOSIS — T3695XA Adverse effect of unspecified systemic antibiotic, initial encounter: Secondary | ICD-10-CM | POA: Diagnosis present

## 2020-07-28 DIAGNOSIS — R54 Age-related physical debility: Secondary | ICD-10-CM | POA: Diagnosis present

## 2020-07-28 DIAGNOSIS — H409 Unspecified glaucoma: Secondary | ICD-10-CM | POA: Diagnosis present

## 2020-07-28 DIAGNOSIS — Z0189 Encounter for other specified special examinations: Secondary | ICD-10-CM

## 2020-07-28 DIAGNOSIS — Z833 Family history of diabetes mellitus: Secondary | ICD-10-CM

## 2020-07-28 DIAGNOSIS — R0902 Hypoxemia: Secondary | ICD-10-CM

## 2020-07-28 DIAGNOSIS — K521 Toxic gastroenteritis and colitis: Secondary | ICD-10-CM | POA: Diagnosis present

## 2020-07-28 DIAGNOSIS — N179 Acute kidney failure, unspecified: Secondary | ICD-10-CM

## 2020-07-28 DIAGNOSIS — Z794 Long term (current) use of insulin: Secondary | ICD-10-CM

## 2020-07-28 DIAGNOSIS — Z1331 Encounter for screening for depression: Secondary | ICD-10-CM

## 2020-07-28 DIAGNOSIS — J1282 Pneumonia due to coronavirus disease 2019: Secondary | ICD-10-CM | POA: Diagnosis present

## 2020-07-28 DIAGNOSIS — Z8249 Family history of ischemic heart disease and other diseases of the circulatory system: Secondary | ICD-10-CM

## 2020-07-28 DIAGNOSIS — R140 Abdominal distension (gaseous): Secondary | ICD-10-CM

## 2020-07-28 DIAGNOSIS — I951 Orthostatic hypotension: Secondary | ICD-10-CM | POA: Diagnosis not present

## 2020-07-28 DIAGNOSIS — Z79899 Other long term (current) drug therapy: Secondary | ICD-10-CM

## 2020-07-28 DIAGNOSIS — Z791 Long term (current) use of non-steroidal anti-inflammatories (NSAID): Secondary | ICD-10-CM

## 2020-07-28 DIAGNOSIS — A4189 Other specified sepsis: Secondary | ICD-10-CM | POA: Diagnosis present

## 2020-07-28 LAB — BASIC METABOLIC PANEL
Anion gap: 25 — ABNORMAL HIGH (ref 5–15)
BUN: 18 mg/dL (ref 6–20)
CO2: 14 mmol/L — ABNORMAL LOW (ref 22–32)
Calcium: 9.8 mg/dL (ref 8.9–10.3)
Chloride: 98 mmol/L (ref 98–111)
Creatinine, Ser: 1.29 mg/dL — ABNORMAL HIGH (ref 0.44–1.00)
GFR, Estimated: 48 mL/min — ABNORMAL LOW (ref >60)
Glucose, Bld: 461 mg/dL — ABNORMAL HIGH (ref 70–99)
Potassium: 4.8 mmol/L (ref 3.5–5.1)
Sodium: 137 mmol/L (ref 135–145)

## 2020-07-28 LAB — RESP PANEL BY RT-PCR (FLU A&B, COVID) ARPGX2
Influenza A by PCR: NEGATIVE
Influenza B by PCR: NEGATIVE
SARS Coronavirus 2 by RT PCR: POSITIVE — AB

## 2020-07-28 LAB — CBC
HCT: 43.3 % (ref 36.0–46.0)
Hemoglobin: 14.5 g/dL (ref 12.0–15.0)
MCH: 31.2 pg (ref 26.0–34.0)
MCHC: 33.5 g/dL (ref 30.0–36.0)
MCV: 93.1 fL (ref 80.0–100.0)
Platelets: 282 10*3/uL (ref 150–400)
RBC: 4.65 MIL/uL (ref 3.87–5.11)
RDW: 13.1 % (ref 11.5–15.5)
WBC: 20 10*3/uL — ABNORMAL HIGH (ref 4.0–10.5)
nRBC: 0 % (ref 0.0–0.2)

## 2020-07-28 LAB — CBG MONITORING, ED: Glucose-Capillary: 558 mg/dL (ref 70–99)

## 2020-07-28 MED ORDER — ALBUTEROL SULFATE HFA 108 (90 BASE) MCG/ACT IN AERS
2.0000 | INHALATION_SPRAY | RESPIRATORY_TRACT | Status: DC | PRN
  Filled 2020-07-28: qty 6.7

## 2020-07-28 NOTE — ED Triage Notes (Signed)
Pt presents to ED POV. Pt c/o SOB. Pt reports that she was exposed to covid on 12/25 and began to get sick afterwards. Pt is not vaccinated for covid. Pt is tachypnec in triage and tearful d/t throat pain.

## 2020-07-29 ENCOUNTER — Inpatient Hospital Stay (HOSPITAL_COMMUNITY): Payer: HRSA Program

## 2020-07-29 ENCOUNTER — Encounter (HOSPITAL_COMMUNITY): Payer: Self-pay | Admitting: Internal Medicine

## 2020-07-29 ENCOUNTER — Other Ambulatory Visit: Payer: Self-pay

## 2020-07-29 ENCOUNTER — Encounter (HOSPITAL_COMMUNITY): Payer: Medicaid Other

## 2020-07-29 DIAGNOSIS — J9601 Acute respiratory failure with hypoxia: Secondary | ICD-10-CM

## 2020-07-29 DIAGNOSIS — E1142 Type 2 diabetes mellitus with diabetic polyneuropathy: Secondary | ICD-10-CM | POA: Diagnosis present

## 2020-07-29 DIAGNOSIS — Z79899 Other long term (current) drug therapy: Secondary | ICD-10-CM | POA: Diagnosis not present

## 2020-07-29 DIAGNOSIS — I951 Orthostatic hypotension: Secondary | ICD-10-CM | POA: Diagnosis not present

## 2020-07-29 DIAGNOSIS — Z791 Long term (current) use of non-steroidal anti-inflammatories (NSAID): Secondary | ICD-10-CM | POA: Diagnosis not present

## 2020-07-29 DIAGNOSIS — E2740 Unspecified adrenocortical insufficiency: Secondary | ICD-10-CM | POA: Diagnosis present

## 2020-07-29 DIAGNOSIS — G894 Chronic pain syndrome: Secondary | ICD-10-CM | POA: Diagnosis present

## 2020-07-29 DIAGNOSIS — N179 Acute kidney failure, unspecified: Secondary | ICD-10-CM | POA: Diagnosis present

## 2020-07-29 DIAGNOSIS — U071 COVID-19: Secondary | ICD-10-CM

## 2020-07-29 DIAGNOSIS — Z87891 Personal history of nicotine dependence: Secondary | ICD-10-CM | POA: Diagnosis not present

## 2020-07-29 DIAGNOSIS — R54 Age-related physical debility: Secondary | ICD-10-CM | POA: Diagnosis present

## 2020-07-29 DIAGNOSIS — H409 Unspecified glaucoma: Secondary | ICD-10-CM | POA: Diagnosis present

## 2020-07-29 DIAGNOSIS — Z9114 Patient's other noncompliance with medication regimen: Secondary | ICD-10-CM | POA: Diagnosis not present

## 2020-07-29 DIAGNOSIS — M5010 Cervical disc disorder with radiculopathy, unspecified cervical region: Secondary | ICD-10-CM | POA: Diagnosis present

## 2020-07-29 DIAGNOSIS — R0602 Shortness of breath: Secondary | ICD-10-CM

## 2020-07-29 DIAGNOSIS — T3695XA Adverse effect of unspecified systemic antibiotic, initial encounter: Secondary | ICD-10-CM | POA: Diagnosis present

## 2020-07-29 DIAGNOSIS — A4189 Other specified sepsis: Secondary | ICD-10-CM | POA: Diagnosis present

## 2020-07-29 DIAGNOSIS — J80 Acute respiratory distress syndrome: Secondary | ICD-10-CM | POA: Diagnosis present

## 2020-07-29 DIAGNOSIS — E1110 Type 2 diabetes mellitus with ketoacidosis without coma: Secondary | ICD-10-CM | POA: Diagnosis present

## 2020-07-29 DIAGNOSIS — Z794 Long term (current) use of insulin: Secondary | ICD-10-CM | POA: Diagnosis not present

## 2020-07-29 DIAGNOSIS — Z885 Allergy status to narcotic agent status: Secondary | ICD-10-CM | POA: Diagnosis not present

## 2020-07-29 DIAGNOSIS — R0902 Hypoxemia: Secondary | ICD-10-CM | POA: Diagnosis not present

## 2020-07-29 DIAGNOSIS — Z90710 Acquired absence of both cervix and uterus: Secondary | ICD-10-CM | POA: Diagnosis not present

## 2020-07-29 DIAGNOSIS — J1282 Pneumonia due to coronavirus disease 2019: Secondary | ICD-10-CM | POA: Diagnosis present

## 2020-07-29 DIAGNOSIS — I10 Essential (primary) hypertension: Secondary | ICD-10-CM | POA: Diagnosis present

## 2020-07-29 DIAGNOSIS — K521 Toxic gastroenteritis and colitis: Secondary | ICD-10-CM | POA: Diagnosis present

## 2020-07-29 DIAGNOSIS — J159 Unspecified bacterial pneumonia: Secondary | ICD-10-CM | POA: Diagnosis present

## 2020-07-29 LAB — BETA-HYDROXYBUTYRIC ACID
Beta-Hydroxybutyric Acid: >8 mmol/L — ABNORMAL HIGH (ref 0.05–0.27)
Beta-Hydroxybutyric Acid: 0.59 mmol/L — ABNORMAL HIGH (ref 0.05–0.27)

## 2020-07-29 LAB — HEMOGLOBIN A1C
Hgb A1c MFr Bld: 12.3 % — ABNORMAL HIGH (ref 4.8–5.6)
Mean Plasma Glucose: 306.31 mg/dL

## 2020-07-29 LAB — HIV ANTIBODY (ROUTINE TESTING W REFLEX): HIV Screen 4th Generation wRfx: NONREACTIVE

## 2020-07-29 LAB — CBG MONITORING, ED
Glucose-Capillary: >600 mg/dL (ref 70–99)
Glucose-Capillary: >600 mg/dL (ref 70–99)
Glucose-Capillary: >600 mg/dL (ref 70–99)
Glucose-Capillary: 512 mg/dL (ref 70–99)
Glucose-Capillary: 563 mg/dL (ref 70–99)
Glucose-Capillary: 449 mg/dL — ABNORMAL HIGH (ref 70–99)
Glucose-Capillary: 398 mg/dL — ABNORMAL HIGH (ref 70–99)
Glucose-Capillary: 367 mg/dL — ABNORMAL HIGH (ref 70–99)
Glucose-Capillary: 288 mg/dL — ABNORMAL HIGH (ref 70–99)
Glucose-Capillary: 247 mg/dL — ABNORMAL HIGH (ref 70–99)

## 2020-07-29 LAB — TRIGLYCERIDES: Triglycerides: 156 mg/dL — ABNORMAL HIGH (ref <150)

## 2020-07-29 LAB — BASIC METABOLIC PANEL
Anion gap: 16 — ABNORMAL HIGH (ref 5–15)
Anion gap: 14 (ref 5–15)
BUN: 33 mg/dL — ABNORMAL HIGH (ref 6–20)
BUN: 32 mg/dL — ABNORMAL HIGH (ref 6–20)
CO2: 19 mmol/L — ABNORMAL LOW (ref 22–32)
CO2: 21 mmol/L — ABNORMAL LOW (ref 22–32)
Calcium: 9 mg/dL (ref 8.9–10.3)
Calcium: 9.2 mg/dL (ref 8.9–10.3)
Chloride: 100 mmol/L (ref 98–111)
Chloride: 100 mmol/L (ref 98–111)
Creatinine, Ser: 1.18 mg/dL — ABNORMAL HIGH (ref 0.44–1.00)
Creatinine, Ser: 1.07 mg/dL — ABNORMAL HIGH (ref 0.44–1.00)
GFR, Estimated: 53 mL/min — ABNORMAL LOW (ref >60)
GFR, Estimated: 59 mL/min — ABNORMAL LOW (ref >60)
Glucose, Bld: 325 mg/dL — ABNORMAL HIGH (ref 70–99)
Glucose, Bld: 254 mg/dL — ABNORMAL HIGH (ref 70–99)
Potassium: 3.7 mmol/L (ref 3.5–5.1)
Potassium: 3.3 mmol/L — ABNORMAL LOW (ref 3.5–5.1)
Sodium: 135 mmol/L (ref 135–145)
Sodium: 135 mmol/L (ref 135–145)

## 2020-07-29 LAB — COMPREHENSIVE METABOLIC PANEL
ALT: 30 U/L (ref 0–44)
AST: 22 U/L (ref 15–41)
Albumin: 3.6 g/dL (ref 3.5–5.0)
Alkaline Phosphatase: 190 U/L — ABNORMAL HIGH (ref 38–126)
Anion gap: 32 — ABNORMAL HIGH (ref 5–15)
BUN: 39 mg/dL — ABNORMAL HIGH (ref 6–20)
CO2: 11 mmol/L — ABNORMAL LOW (ref 22–32)
Calcium: 9.3 mg/dL (ref 8.9–10.3)
Chloride: 89 mmol/L — ABNORMAL LOW (ref 98–111)
Creatinine, Ser: 2.1 mg/dL — ABNORMAL HIGH (ref 0.44–1.00)
GFR, Estimated: 26 mL/min — ABNORMAL LOW (ref >60)
Glucose, Bld: 775 mg/dL (ref 70–99)
Potassium: 4.4 mmol/L (ref 3.5–5.1)
Sodium: 132 mmol/L — ABNORMAL LOW (ref 135–145)
Total Bilirubin: 1.9 mg/dL — ABNORMAL HIGH (ref 0.3–1.2)
Total Protein: 8 g/dL (ref 6.5–8.1)

## 2020-07-29 LAB — PROCALCITONIN: Procalcitonin: 34.74 ng/mL

## 2020-07-29 LAB — ECHOCARDIOGRAM LIMITED
Calc EF: 64.6 %
Height: 66 in
S' Lateral: 1.7 cm
Single Plane A2C EF: 69.1 %
Single Plane A4C EF: 59.2 %
Weight: 1982.38 oz

## 2020-07-29 LAB — I-STAT VENOUS BLOOD GAS, ED
Acid-base deficit: 11 mmol/L — ABNORMAL HIGH (ref 0.0–2.0)
Bicarbonate: 13.4 mmol/L — ABNORMAL LOW (ref 20.0–28.0)
Calcium, Ion: 1.14 mmol/L — ABNORMAL LOW (ref 1.15–1.40)
HCT: 39 % (ref 36.0–46.0)
Hemoglobin: 13.3 g/dL (ref 12.0–15.0)
O2 Saturation: 87 %
Potassium: 4.2 mmol/L (ref 3.5–5.1)
Sodium: 137 mmol/L (ref 135–145)
TCO2: 14 mmol/L — ABNORMAL LOW (ref 22–32)
pCO2, Ven: 26.9 mmHg — ABNORMAL LOW (ref 44.0–60.0)
pH, Ven: 7.306 (ref 7.250–7.430)
pO2, Ven: 57 mmHg — ABNORMAL HIGH (ref 32.0–45.0)

## 2020-07-29 LAB — GLUCOSE, CAPILLARY
Glucose-Capillary: 249 mg/dL — ABNORMAL HIGH (ref 70–99)
Glucose-Capillary: 256 mg/dL — ABNORMAL HIGH (ref 70–99)
Glucose-Capillary: 255 mg/dL — ABNORMAL HIGH (ref 70–99)
Glucose-Capillary: 179 mg/dL — ABNORMAL HIGH (ref 70–99)
Glucose-Capillary: 204 mg/dL — ABNORMAL HIGH (ref 70–99)
Glucose-Capillary: 175 mg/dL — ABNORMAL HIGH (ref 70–99)
Glucose-Capillary: 168 mg/dL — ABNORMAL HIGH (ref 70–99)
Glucose-Capillary: 114 mg/dL — ABNORMAL HIGH (ref 70–99)
Glucose-Capillary: 176 mg/dL — ABNORMAL HIGH (ref 70–99)

## 2020-07-29 LAB — TROPONIN I (HIGH SENSITIVITY)
Troponin I (High Sensitivity): 35 ng/L — ABNORMAL HIGH (ref <18)
Troponin I (High Sensitivity): 54 ng/L — ABNORMAL HIGH (ref <18)

## 2020-07-29 LAB — FERRITIN: Ferritin: 357 ng/mL — ABNORMAL HIGH (ref 11–307)

## 2020-07-29 LAB — C-REACTIVE PROTEIN: CRP: 28.7 mg/dL — ABNORMAL HIGH (ref <1.0)

## 2020-07-29 LAB — LACTIC ACID, PLASMA
Lactic Acid, Venous: 2.9 mmol/L (ref 0.5–1.9)
Lactic Acid, Venous: 2.4 mmol/L (ref 0.5–1.9)
Lactic Acid, Venous: 2 mmol/L (ref 0.5–1.9)

## 2020-07-29 LAB — CORTISOL: Cortisol, Plasma: >100 ug/dL

## 2020-07-29 LAB — I-STAT BETA HCG BLOOD, ED (MC, WL, AP ONLY): I-stat hCG, quantitative: 11.8 m[IU]/mL — ABNORMAL HIGH (ref <5)

## 2020-07-29 LAB — LACTATE DEHYDROGENASE: LDH: 242 U/L — ABNORMAL HIGH (ref 98–192)

## 2020-07-29 LAB — MRSA PCR SCREENING: MRSA by PCR: NEGATIVE

## 2020-07-29 MED ORDER — LACTATED RINGERS IV SOLN: INTRAVENOUS | Status: DC

## 2020-07-29 MED ORDER — METHYLPREDNISOLONE SODIUM SUCC 125 MG IJ SOLR
60.0000 mg | INTRAMUSCULAR | Status: DC
  Administered 2020-07-29 – 2020-07-31 (×3): 60 mg via INTRAVENOUS
  Filled 2020-07-29 (×3): qty 2

## 2020-07-29 MED ORDER — POTASSIUM CHLORIDE 10 MEQ/100ML IV SOLN
10.0000 meq | INTRAVENOUS | Status: AC
  Administered 2020-07-29 (×2): 10 meq via INTRAVENOUS
  Filled 2020-07-29 (×2): qty 100

## 2020-07-29 MED ORDER — ENOXAPARIN SODIUM 40 MG/0.4ML ~~LOC~~ SOLN
40.0000 mg | SUBCUTANEOUS | Status: DC
  Administered 2020-07-30 – 2020-08-12 (×14): 40 mg via SUBCUTANEOUS
  Filled 2020-07-29 (×14): qty 0.4

## 2020-07-29 MED ORDER — ACETAMINOPHEN 650 MG RE SUPP
650.0000 mg | RECTAL | Status: DC | PRN
  Administered 2020-07-29: 650 mg via RECTAL
  Filled 2020-07-29: qty 1

## 2020-07-29 MED ORDER — INSULIN REGULAR(HUMAN) IN NACL 100-0.9 UT/100ML-% IV SOLN
INTRAVENOUS | Status: DC
  Administered 2020-07-29: 5.5 [IU]/h via INTRAVENOUS
  Administered 2020-07-29: 9.5 [IU]/h via INTRAVENOUS
  Filled 2020-07-29 (×2): qty 100

## 2020-07-29 MED ORDER — DOXYCYCLINE HYCLATE 100 MG PO TABS
100.0000 mg | ORAL_TABLET | Freq: Two times a day (BID) | ORAL | Status: DC
  Filled 2020-07-29: qty 1

## 2020-07-29 MED ORDER — SODIUM CHLORIDE 0.9 % IV SOLN
2.0000 g | INTRAVENOUS | Status: AC
  Administered 2020-07-29 – 2020-08-02 (×5): 2 g via INTRAVENOUS
  Filled 2020-07-29 (×5): qty 20

## 2020-07-29 MED ORDER — ENOXAPARIN SODIUM 30 MG/0.3ML ~~LOC~~ SOLN
30.0000 mg | SUBCUTANEOUS | Status: DC
  Administered 2020-07-29: 30 mg via SUBCUTANEOUS
  Filled 2020-07-29: qty 0.3

## 2020-07-29 MED ORDER — POTASSIUM CHLORIDE 10 MEQ/100ML IV SOLN
10.0000 meq | INTRAVENOUS | Status: AC
  Administered 2020-07-29 – 2020-07-30 (×4): 10 meq via INTRAVENOUS
  Filled 2020-07-29 (×4): qty 100

## 2020-07-29 MED ORDER — DEXTROSE 50 % IV SOLN: 0.0000 mL | INTRAVENOUS | Status: DC | PRN

## 2020-07-29 MED ORDER — SODIUM CHLORIDE 0.9 % IV BOLUS
1000.0000 mL | Freq: Once | INTRAVENOUS | Status: AC
  Administered 2020-07-29: 1000 mL via INTRAVENOUS

## 2020-07-29 MED ORDER — SODIUM CHLORIDE 0.9 % IV SOLN
500.0000 mg | INTRAVENOUS | Status: DC
  Administered 2020-07-30 – 2020-08-03 (×5): 500 mg via INTRAVENOUS
  Filled 2020-07-29 (×5): qty 500

## 2020-07-29 MED ORDER — LACTATED RINGERS IV BOLUS
20.0000 mL/kg | Freq: Once | INTRAVENOUS | Status: AC
  Administered 2020-07-29: 1270 mL via INTRAVENOUS

## 2020-07-29 MED ORDER — CHLORHEXIDINE GLUCONATE CLOTH 2 % EX PADS
6.0000 | MEDICATED_PAD | Freq: Every day | CUTANEOUS | Status: DC
  Administered 2020-07-29 – 2020-08-03 (×6): 6 via TOPICAL

## 2020-07-29 MED ORDER — INSULIN ASPART 100 UNIT/ML ~~LOC~~ SOLN: 10.0000 [IU] | Freq: Once | SUBCUTANEOUS | Status: DC

## 2020-07-29 MED ORDER — DEXTROSE IN LACTATED RINGERS 5 % IV SOLN: INTRAVENOUS | Status: DC

## 2020-07-29 NOTE — Progress Notes (Signed)
  Echocardiogram 2D Echocardiogram has been performed.  [name] [name] 07/29/2020, 4:44 PM

## 2020-07-29 NOTE — ED Notes (Signed)
ICU reports room is still being cleaned

## 2020-07-29 NOTE — Progress Notes (Signed)
[facility] Progress Note Patient Name: [name] [name] DOB: 1960-12-12 MRN: [id number]   Date of Service  07/29/2020  HPI/Events of Note  Multiple issues: 1. Sinus Tachycardia - HR = 141. Likely d/t volume depletion vs COVID. 2. K+ = 3.3 and Creatinine = 1.07. 3. Urinary retention - Bladder scan residual = NA. 4. Too weak to swallow Doxycycline PO. Doxycycline only comes as PO formulation.   eICU Interventions  Plan: 1. Bolus with 0.9 NaCl 1 liter IV over 1 hour now.  2. Replace K+. 3. I/O Cath PRN. 4. Place NGT. 5. Portable Abdominal film post placement of NGT.     Intervention Category Major Interventions: Electrolyte abnormality - evaluation and management;Arrhythmia - evaluation and management;Other:  Lysle Dingwall 07/29/2020, 8:26 PM

## 2020-07-29 NOTE — Progress Notes (Signed)
Inpatient Diabetes Program Recommendations  AACE/ADA: New Consensus Statement on Inpatient Glycemic Control (2015)  Target Ranges:  Prepandial:   less than 140 mg/dL      Peak postprandial:   less than 180 mg/dL (1-2 hours)      Critically ill patients:  140 - 180 mg/dL   Lab Results  Component Value Date   GLUCAP 256 (H) 07/29/2020   HGBA1C 12.3 (H) 07/29/2020    Review of Glycemic Control Results for MAKINSLEY, SCHIAVI (MRN [id number]) as of 07/29/2020 15:45  Ref. Range 07/29/2020 11:31 07/29/2020 12:37 07/29/2020 13:40 07/29/2020 13:52 07/29/2020 14:51  Glucose-Capillary Latest Ref Range: 70 - 99 mg/dL 367 (H) 288 (H) 249 (H) 247 (H) 256 (H)   Diabetes history: DM 2 Outpatient Diabetes medications:  Lantus 50 units daily, Humalog 18 units tid with meals Current orders for Inpatient glycemic control:  IV insulin/DKA order set Solumedrol 60 mg IV q 24 hours Inpatient Diabetes Program Recommendations:    Agree with insulin drip. Once acidosis cleared, consider adding Levemir 12 units bid and Novolog moderate q 4 hours.  Will follow- A1C=12.3% so patient needs improved glycemic control. Once patient feeling better, will need to discuss DM with patient when appropriate.   Thanks,  Adah Perl, RN, BC-ADM Inpatient Diabetes Coordinator Pager [phone] (8a-5p)

## 2020-07-29 NOTE — ED Notes (Signed)
Lactic       2.9 Glucose  775

## 2020-07-29 NOTE — H&P (Addendum)
NAME:  [name] [name], MRN:  [id number], DOB:  August 31, 1960, LOS: 0 ADMISSION DATE:  07/28/2020, CONSULTATION DATE:  1/10 REFERRING MD:  Dr. Rex Kras, CHIEF COMPLAINT:  COVID/DKA   Brief History:  60 year female presented with SOB. Admitted for DKA, but while still in ED became hypoxic after IVF bolus. COVID-19 positive.   History of Present Illness:  60 year old female with PMH as below, which is significant for DM, chronic pain syndrome, and HTN. Presented from home with complaints of SOB and generalized malaise for approximately one week. She has been poorly compliant with insulin during that time due to overall ill feeling and poor PO intake. Sick contact in the home with COVID-19 exposure 12/25. Presented to ED 1/9 PM and was brought back to room 1/10 AM. O2 sats 99% on room air at that time. Initial workup concerning for DKA and she was given 2.5L IVF resuscitation and was started on insulin infusion. She then became hypoxic requiring escalation to 100% 35LPM heated hight flow. CXR essentially clear. PCCM asked to admit.   Past Medical History:   has a past medical history of Cervical disc disorder with radiculopathy of cervical region, Chronic pain syndrome, Degenerative joint disease, Depression (dx 1997), Diabetes mellitus, type 2 ([place]) (2011), Diabetic polyneuropathy ([place]), GERD (gastroesophageal reflux disease), Glaucoma, Headache(784.0), Hypertension, Lumbar herniated disc, Non-compliance, Pancreatic cyst, Pneumonia (08/02/2012), Shingles, Tooth loss, Torn meniscus, and Vertigo.   Significant Hospital Events:  1/9 presented to ED 1/10 admit for DKA, COVID-19 PNA  Consults:    Procedures:    Significant Diagnostic Tests:    Micro Data:    Antimicrobials:     Interim History / Subjective:    Objective   Blood pressure (!) 150/63, pulse (!) 150, temperature 98.3 F (36.8 C), temperature source Oral, resp. rate (!) 24, height 5' 6"  (1.676 m), weight 63.5 kg, SpO2 (!) 89 %.     FiO2 (%):  [100 %] 100 %   Intake/Output Summary (Last 24 hours) at 07/29/2020 1125 Last data filed at 07/29/2020 9326 Gross per 24 hour  Intake 100 ml  Output --  Net 100 ml   Filed Weights   07/29/20 0600  Weight: 63.5 kg    Examination: General: frail middle aged female in NAD HENT: [place]/AT, PERRL, no JVD, poor dentition.  Lungs: Clear bilateral breath sounds Cardiovascular: Tachy to 150s, regular Abdomen: Soft, diffuse mild tenderness. Non-distended Extremities: No acute deformity or ROM limitation Neuro: Alert, oriented, non-focal.   Resolved Hospital Problem list     Assessment & Plan:   Diabetic ketoacidosis - s/p 2.5 L IVF - LR at 125 ml/Hr ongoing - See CXR prior to additional volume considering respiratory compromise after IVF.   - Insulin per endotool protocol  COVID-19 infection - Heated high flow 100% 35LPM - Keep SpO2 > 85% at rest.  - Will start steroid regimen with low threshold to DC considering DKA/hyperglycemia.  - No linagliptan due to DKA - encourage IS, awake proning - Echo - Doppler lower extremities, cannot CTA chest due to renal failure. CXR clear.   Chronic pain - Will need to hold off on opioid pain medications for now - Tylenol PRN, hold meloxicam (renal failure)  AKI - Hydrate if oxygenation will allow - Trend BMP  Adrenal insufficiency - check cortisol - hold home florinef  Leukocytosis: likely reactive  Best practice (evaluated daily)  Diet: NPO Pain/Anxiety/Delirium protocol (if indicated): NA VAP protocol (if indicated): NA DVT prophylaxis: Lovenox GI prophylaxis:  NA Glucose control: Endotool Mobility: BR Disposition:ICU  Goals of Care:  Last date of multidisciplinary goals of care discussion: Family and staff present:  Summary of discussion:  Follow up goals of care discussion due: 1/17 Code Status: FULL  Labs   CBC: Recent Labs  Lab 07/28/20 1809 07/29/20 0917  WBC 20.0*  --   HGB 14.5 13.3  HCT 43.3  39.0  MCV 93.1  --   PLT 282  --     Basic Metabolic Panel: Recent Labs  Lab 07/28/20 1809 07/29/20 0730 07/29/20 0917  NA 137 132* 137  K 4.8 4.4 4.2  CL 98 89*  --   CO2 14* 11*  --   GLUCOSE 461* 775*  --   BUN 18 39*  --   CREATININE 1.29* 2.10*  --   CALCIUM 9.8 9.3  --    GFR: Estimated Creatinine Clearance: 26.7 mL/min (A) (by C-G formula based on SCr of 2.1 mg/dL (H)). Recent Labs  Lab 07/28/20 1809 07/29/20 0730 07/29/20 0908  PROCALCITON  --  34.74  --   WBC 20.0*  --   --   LATICACIDVEN  --  2.9* 2.4*    Liver Function Tests: Recent Labs  Lab 07/29/20 0730  AST 22  ALT 30  ALKPHOS 190*  BILITOT 1.9*  PROT 8.0  ALBUMIN 3.6   No results for input(s): LIPASE, AMYLASE in the last 168 hours. No results for input(s): AMMONIA in the last 168 hours.  ABG    Component Value Date/Time   HCO3 13.4 (L) 07/29/2020 0917   TCO2 14 (L) 07/29/2020 0917   ACIDBASEDEF 11.0 (H) 07/29/2020 0917   O2SAT 87.0 07/29/2020 0917     Coagulation Profile: No results for input(s): INR, PROTIME in the last 168 hours.  Cardiac Enzymes: No results for input(s): CKTOTAL, CKMB, CKMBINDEX, TROPONINI in the last 168 hours.  HbA1C: HbA1c POC (<> result, manual entry)  Date/Time Value Ref Range Status  01/25/2018 01:36 PM >15.0 4.0 - 5.6 % Final   Hgb A1c MFr Bld  Date/Time Value Ref Range Status  02/24/2020 05:41 PM 14.8 (H) 4.8 - 5.6 % Final    Comment:    (NOTE) Pre diabetes:          5.7%-6.4%  Diabetes:              >6.4%  Glycemic control for   <7.0% adults with diabetes   09/06/2019 03:00 PM 11.5 (H) 4.8 - 5.6 % Final    Comment:    (NOTE) Pre diabetes:          5.7%-6.4% Diabetes:              >6.4% Glycemic control for   <7.0% adults with diabetes     CBG: Recent Labs  Lab 07/29/20 0809 07/29/20 0851 07/29/20 0923 07/29/20 0958 07/29/20 1034  GLUCAP >600* 563* 512* 449* 398*    Review of Systems:   Bolds are positive  Constitutional:  weight loss, gain, night sweats, Fevers, chills, fatigue, body aches .  HEENT: headaches, Sore throat, sneezing, nasal congestion, post nasal drip, Difficulty swallowing, Tooth/dental problems, visual complaints visual changes, ear ache CV:  chest pain (reproducable), radiates:,Orthopnea, PND, swelling in lower extremities, dizziness, palpitations, syncope.  GI  heartburn, indigestion, abdominal pain, nausea, vomiting, diarrhea, change in bowel habits, loss of appetite, bloody stools.  Resp: cough, productive: , hemoptysis, dyspnea, chest pain, pleuritic.  Skin: rash or itching or icterus GU: dysuria, change in color of  urine, urgency or frequency. flank pain, hematuria  MS: joint pain or swelling. decreased range of motion  Psych: change in mood or affect. depression or anxiety.  Neuro: difficulty with speech, weakness, numbness, ataxia   Past Medical History:  She,  has a past medical history of Cervical disc disorder with radiculopathy of cervical region, Chronic pain syndrome, Degenerative joint disease, Depression (dx 1997), Diabetes mellitus, type 2 ([place]) (2011), Diabetic polyneuropathy ([place]), GERD (gastroesophageal reflux disease), Glaucoma, Headache(784.0), Hypertension, Lumbar herniated disc, Non-compliance, Pancreatic cyst, Pneumonia (08/02/2012), Shingles, Tooth loss, Torn meniscus, and Vertigo.   Surgical History:   Past Surgical History:  Procedure Laterality Date  . ABDOMINAL HYSTERECTOMY    . CESAREAN SECTION    . ORIF ANKLE FRACTURE Right 02/25/2020   Procedure: OPEN REDUCTION INTERNAL FIXATION (ORIF) ANKLE FRACTURE;  Surgeon: Lovell Sheehan, MD;  Location: [facility] ORS;  Service: Orthopedics;  Laterality: Right;     Social History:   reports that she quit smoking about 42 years ago. She has never used smokeless tobacco. She reports that she does not drink alcohol and does not use drugs.   Family History:  Her family history includes Depression in her mother; Diabetes in her  mother; Heart attack in her brother; Heart failure in her maternal grandmother; Lung cancer in her father; Renal Disease in her mother.   Allergies Allergies  Allergen Reactions  . Tramadol Nausea And Vomiting    REACTION: Projectile vomiting     Home Medications  Prior to Admission medications   Medication Sig Start Date End Date Taking? Authorizing Provider  acetaminophen (TYLENOL) 500 MG tablet Take 2 tablets (1,000 mg total) by mouth every 6 (six) hours as needed for mild pain or headache (pain). 07/02/19  Yes Domenic Polite, MD  Cholecalciferol (VITAMIN D-3) 125 MCG (5000 UT) TABS Take 1 tablet by mouth daily. 01/24/20  Yes Hilts, Legrand Como, MD  diclofenac Sodium (VOLTAREN) 1 % GEL Apply 1 application topically 4 (four) times daily. 01/16/20  Yes Minette Brine, Amy J, NP  DULoxetine (CYMBALTA) 60 MG capsule Take 1 capsule (60 mg total) by mouth 2 (two) times daily. 04/23/20  Yes Patrecia Pour, NP  gabapentin (NEURONTIN) 600 MG tablet TAKE ONE TABLET (600 MG) BY MOUTH EVERY MORNING AND AFTERNOON, TAKE TWO TABLETS AT NIGHT Patient taking differently: Take 600 mg by mouth See admin instructions. TAKE ONE TABLET (600 MG) BY MOUTH EVERY MORNING AND AFTERNOON, TAKE TWO TABLETS AT NIGHT 04/23/20  Yes Lord, Asa Saunas, NP  HUMALOG KWIKPEN 100 UNIT/ML KwikPen Inject 18 Units into the skin 3 (three) times daily. Patient taking differently: Inject 25 Units into the skin 3 (three) times daily. 04/04/20  Yes McClung, Angela M, PA-C  insulin glargine (LANTUS SOLOSTAR) 100 UNIT/ML Solostar Pen Inject 45 Units into the skin daily. Patient taking differently: Inject 50 Units into the skin daily. 04/04/20  Yes McClung, Dionne Bucy, PA-C  Multiple Vitamins-Minerals (CENTRUM SILVER 50+MEN) TABS Take 1 tablet by mouth daily.   Yes [provider]  atorvastatin (LIPITOR) 20 MG tablet Take 1 tablet (20 mg total) by mouth daily. Patient not taking: Reported on 07/29/2020 06/02/20   Gildardo Pounds, NP  Blood Glucose  Monitoring Suppl (TRUE METRIX METER) w/Device KIT Use as instructed to check blood sugar once daily. 04/26/20   Fulp, Cammie, MD  fludrocortisone (FLORINEF) 0.1 MG tablet Take 1 tablet (0.1 mg total) by mouth daily. Patient not taking: No sig reported 05/27/20   Rudene Re, MD  Glucosamine Sulfate 1000  MG CAPS Take 1 capsule (1,000 mg total) by mouth 2 (two) times daily. 01/24/20   Hilts, Legrand Como, MD  glucose blood (TRUE METRIX BLOOD GLUCOSE TEST) test strip Use as instructed to check blood sugar once daily. 04/26/20   Fulp, Cammie, MD  Insulin Pen Needle (PEN NEEDLES) 31G X 8 MM MISC Use as directed 09/07/19   Eugenie Filler, MD  meloxicam (MOBIC) 15 MG tablet Take 0.5-1 tablets (7.5-15 mg total) by mouth daily as needed for pain. 01/24/20   Hilts, Legrand Como, MD     Critical care time: 45 minutes      Georgann Housekeeper, AGACNP-BC [place] for personal pager PCCM on call pager [phone]  07/29/2020 12:15 PM

## 2020-07-29 NOTE — ED Provider Notes (Signed)
Patient in waiting room, noted to have glucose of 558, labs concerning for ketoacidosis.  Will give subcu insulin and attempt to get a bed in the ED soon as possible.   Delora Fuel, MD 22/48/25 [phone]

## 2020-07-29 NOTE — Progress Notes (Signed)
[facility] Progress Note Patient Name: [name] [name] DOB: Sep 06, 1960 MRN: [id number]   Date of Service  07/29/2020  HPI/Events of Note  Patient coughed out NGT placed by nursing for Doxycycline. Progress notes don't reveal a clue as to why Doyccycline started. CAP? Bronchitis?  eICU Interventions  Plan: 1. D/C Doxycycline. 2. Azithromycin 500 mg IV now and Q 24 hours.      Intervention Category Major Interventions: Other:  Lysle Dingwall 07/29/2020, 11:26 PM

## 2020-07-29 NOTE — Progress Notes (Signed)
RT NOTES: Patient transported from ED by RN and tech on nonrebreather, pushed [place] but did not place her back on the [place]. ED RT was not called for transport. ICU RN remained in room with patient until RT arrived to turn the [facility] on. Placed patient back on [place] 40L/100% at this time with NRB mask on top. Sats 96%. Will continue to monitor.

## 2020-07-29 NOTE — CV Procedure (Signed)
BLE venous duplex completed.  Results can be found under chart review under CV PROC. 07/29/2020 6:47 PM Osher Oettinger RVT, RDMS

## 2020-07-29 NOTE — ED Provider Notes (Signed)
[name] EMERGENCY DEPARTMENT Provider Note   CSN: [id number] Arrival date & time: 07/28/20  1750     History Chief Complaint  Patient presents with  . Shortness of Breath    QUITA [name] is a 60 y.o. female with PMH of type II DM, adrenal insufficiency, and chronic pain syndrome with complaints of shortness of breath.    Due to excessive wait times, patient was seen in the waiting room for approximately 13 hours prior to my examination.  Upon my exam, patient was noted to be in DKA.  She is reluctant to speak due to sore throat symptoms and dryness, but patient is answering questions appropriately.  She has not been taking her diabetic medications "for a while".  Patient states that her entire family has tested positive for COVID-19 so she is not surprised by her positive test here in the ED.  She endorses chest pain, shortness of breath, extreme thirst, nausea, and weakness.  She denies any recent history of clots or clotting disorder, abdominal pain, hemoptysis, or dysuria.  Patient was not immunized for COVID-19.  HPI     Past Medical History:  Diagnosis Date  . Cervical disc disorder with radiculopathy of cervical region   . Chronic low back pain    HNP  . Chronic pain syndrome   . Degenerative joint disease   . Depression dx 1997  . Diabetes mellitus, type 2 ([place]) 2011   No insulin  . Diabetic polyneuropathy ([place])   . GERD (gastroesophageal reflux disease)   . Glaucoma   . Headache(784.0)   . Herniated disc   . Hypertension    Lab 11/2011:  CXR, EKG, CBC, TSH, BMet, troponin-normal; lipid profile: 188, 131, 36, 126   . Lumbar herniated disc   . Non-compliance   . Pancreatic cyst    Endoscopic aspiration in 09/2009  . Pneumonia 08/02/2012  . Shingles   . 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 Torn meniscus   . Vertigo     Patient Active Problem List   Diagnosis Date Noted  . Dehydration   . Hyperglycemia   . Preoperative  clearance 02/25/2020  . Fracture of medial malleolus, right, closed 02/24/2020  . Hyperglycemia due to type 2 diabetes mellitus ([place]) 02/24/2020  . Autonomic dysfunction 02/24/2020  . Fall 02/24/2020  . Adrenal insufficiency ([place]) 09/07/2019  . Recurrent syncope 09/06/2019  . Sinus tachycardia 09/06/2019  . Hypokalemia 09/06/2019  . Pulmonary nodules 09/06/2019  . Orthostatic hypotension   . Syncope and collapse 06/30/2019  . Hypotension 06/29/2019  . Chronic pain syndrome 08/24/2017  . Diabetic polyneuropathy associated with type 2 diabetes mellitus ([place]) 08/24/2017  . Restless leg syndrome 08/24/2017  . Vertigo 04/09/2016  . Acute sinusitis 06/27/2015  . Cervical disc disorder with radiculopathy of cervical region 02/20/2015  . Neck muscle spasm 10/23/2014  . Healthcare maintenance 10/23/2014  . Depression 11/25/2013  . Diabetes type 2, uncontrolled ([place])     Past Surgical History:  Procedure Laterality Date  . ABDOMINAL HYSTERECTOMY    . CESAREAN SECTION    . ORIF ANKLE FRACTURE Right 02/25/2020   Procedure: OPEN REDUCTION INTERNAL FIXATION (ORIF) ANKLE FRACTURE;  Surgeon: Lovell Sheehan, MD;  Location: [facility] ORS;  Service: Orthopedics;  Laterality: Right;     OB History   No obstetric history on file.     Family History  Problem Relation Age of Onset  . Depression Mother  type 1  . Diabetes Mother   . Renal Disease Mother   . Lung cancer Father   . Heart attack Brother        Died age 48 - was told due to massive MI/heart "exploded"  . Heart failure Maternal Grandmother        Details not totally clear    Social History   Tobacco Use  . Smoking status: Former Smoker    [name] date: 08/03/1978    Years since quitting: 42.0  . Smokeless tobacco: Never Used  . Tobacco comment: Smoked age [age]  Substance Use Topics  . Alcohol use: No    Comment: Former  . Drug use: No    Home Medications Prior to Admission medications   Medication Sig Start Date  End Date Taking? Authorizing Provider  acetaminophen (TYLENOL) 500 MG tablet Take 2 tablets (1,000 mg total) by mouth every 6 (six) hours as needed for mild pain or headache (pain). 07/02/19  Yes Domenic Polite, MD  Cholecalciferol (VITAMIN D-3) 125 MCG (5000 UT) TABS Take 1 tablet by mouth daily. 01/24/20  Yes Hilts, Legrand Como, MD  diclofenac Sodium (VOLTAREN) 1 % GEL Apply 1 application topically 4 (four) times daily. 01/16/20  Yes Minette Brine, Amy J, NP  DULoxetine (CYMBALTA) 60 MG capsule Take 1 capsule (60 mg total) by mouth 2 (two) times daily. 04/23/20  Yes Patrecia Pour, NP  gabapentin (NEURONTIN) 600 MG tablet TAKE ONE TABLET (600 MG) BY MOUTH EVERY MORNING AND AFTERNOON, TAKE TWO TABLETS AT NIGHT Patient taking differently: Take 600 mg by mouth See admin instructions. TAKE ONE TABLET (600 MG) BY MOUTH EVERY MORNING AND AFTERNOON, TAKE TWO TABLETS AT NIGHT 04/23/20  Yes Lord, Asa Saunas, NP  HUMALOG KWIKPEN 100 UNIT/ML KwikPen Inject 18 Units into the skin 3 (three) times daily. Patient taking differently: Inject 25 Units into the skin 3 (three) times daily. 04/04/20  Yes McClung, Angela M, PA-C  insulin glargine (LANTUS SOLOSTAR) 100 UNIT/ML Solostar Pen Inject 45 Units into the skin daily. Patient taking differently: Inject 50 Units into the skin daily. 04/04/20  Yes McClung, Dionne Bucy, PA-C  Multiple Vitamins-Minerals (CENTRUM SILVER 50+MEN) TABS Take 1 tablet by mouth daily.   Yes [provider]  atorvastatin (LIPITOR) 20 MG tablet Take 1 tablet (20 mg total) by mouth daily. Patient not taking: Reported on 07/29/2020 06/02/20   Gildardo Pounds, NP  Blood Glucose Monitoring Suppl (TRUE METRIX METER) w/Device KIT Use as instructed to check blood sugar once daily. 04/26/20   Fulp, Cammie, MD  fludrocortisone (FLORINEF) 0.1 MG tablet Take 1 tablet (0.1 mg total) by mouth daily. Patient not taking: No sig reported 05/27/20   Alfred Levins, [place], MD  Glucosamine Sulfate 1000 MG CAPS Take 1  capsule (1,000 mg total) by mouth 2 (two) times daily. 01/24/20   Hilts, Legrand Como, MD  glucose blood (TRUE METRIX BLOOD GLUCOSE TEST) test strip Use as instructed to check blood sugar once daily. 04/26/20   Fulp, Cammie, MD  Insulin Pen Needle (PEN NEEDLES) 31G X 8 MM MISC Use as directed 09/07/19   Eugenie Filler, MD  meloxicam (MOBIC) 15 MG tablet Take 0.5-1 tablets (7.5-15 mg total) by mouth daily as needed for pain. 01/24/20   Hilts, Legrand Como, MD    Allergies    Tramadol  Review of Systems   Review of Systems  All other systems reviewed and are negative.   Physical Exam Updated Vital Signs BP 131/62   Pulse (!) 146  Temp 98.3 F (36.8 C) (Oral)   Resp (!) 25   Ht 5' 6" (1.676 m)   Wt 63.5 kg   SpO2 91%   BMI 22.60 kg/m   Physical Exam Vitals and nursing note reviewed. Exam conducted with a chaperone present.  Constitutional:      General: She is in acute distress.     Appearance: She is ill-appearing.  HENT:     Head: Normocephalic and atraumatic.     Mouth/Throat:     Mouth: Mucous membranes are dry.     Comments: Difficulty visualizing posterior oropharynx even with tongue depressor.  No significant perioral inflammatory changes or unilateral swelling. Eyes:     General: No scleral icterus.    Conjunctiva/sclera: Conjunctivae normal.  Cardiovascular:     Rate and Rhythm: Regular rhythm. Tachycardia present.     Pulses: Normal pulses.     Comments: Tachycardic in 140s.   Pulmonary:     Comments: Tachypnea and mild increased work of breathing.  Will place on 2 L supplemental O2 given borderline hypoxia. Musculoskeletal:     Cervical back: Normal range of motion. No rigidity.  Skin:    General: Skin is dry.  Neurological:     General: No focal deficit present.     Mental Status: She is alert and oriented to person, place, and time.     GCS: GCS eye subscore is 4. GCS verbal subscore is 5. GCS motor subscore is 6.  Psychiatric:        Mood and Affect: Mood  normal.        Behavior: Behavior normal.        Thought Content: Thought content normal.     ED Results / Procedures / Treatments   Labs (all labs ordered are listed, but only abnormal results are displayed) Labs Reviewed  RESP PANEL BY RT-PCR (FLU A&B, COVID) ARPGX2 - Abnormal; Notable for the following components:      Result Value   SARS Coronavirus 2 by RT PCR POSITIVE (*)    All other components within normal limits  BASIC METABOLIC PANEL - Abnormal; Notable for the following components:   CO2 14 (*)    Glucose, Bld 461 (*)    Creatinine, Ser 1.29 (*)    GFR, Estimated 48 (*)    Anion gap 25 (*)    All other components within normal limits  CBC - Abnormal; Notable for the following components:   WBC 20.0 (*)    All other components within normal limits  COMPREHENSIVE METABOLIC PANEL - Abnormal; Notable for the following components:   Sodium 132 (*)    Chloride 89 (*)    CO2 11 (*)    Glucose, Bld 775 (*)    BUN 39 (*)    Creatinine, Ser 2.10 (*)    Alkaline Phosphatase 190 (*)    Total Bilirubin 1.9 (*)    GFR, Estimated 26 (*)    Anion gap 32 (*)    All other components within normal limits  LACTIC ACID, PLASMA - Abnormal; Notable for the following components:   Lactic Acid, Venous 2.9 (*)    All other components within normal limits  LACTATE DEHYDROGENASE - Abnormal; Notable for the following components:   LDH 242 (*)    All other components within normal limits  FERRITIN - Abnormal; Notable for the following components:   Ferritin 357 (*)    All other components within normal limits  TRIGLYCERIDES - Abnormal; Notable for the following  components:   Triglycerides 156 (*)    All other components within normal limits  C-REACTIVE PROTEIN - Abnormal; Notable for the following components:   CRP 28.7 (*)    All other components within normal limits  CBG MONITORING, ED - Abnormal; Notable for the following components:   Glucose-Capillary 558 (*)    All other  components within normal limits  CBG MONITORING, ED - Abnormal; Notable for the following components:   Glucose-Capillary >600 (*)    All other components within normal limits  I-STAT VENOUS BLOOD GAS, ED - Abnormal; Notable for the following components:   pCO2, Ven 26.9 (*)    pO2, Ven 57.0 (*)    Bicarbonate 13.4 (*)    TCO2 14 (*)    Acid-base deficit 11.0 (*)    Calcium, Ion 1.14 (*)    All other components within normal limits  CBG MONITORING, ED - Abnormal; Notable for the following components:   Glucose-Capillary >600 (*)    All other components within normal limits  CBG MONITORING, ED - Abnormal; Notable for the following components:   Glucose-Capillary >600 (*)    All other components within normal limits  CBG MONITORING, ED - Abnormal; Notable for the following components:   Glucose-Capillary 563 (*)    All other components within normal limits  CBG MONITORING, ED - Abnormal; Notable for the following components:   Glucose-Capillary 512 (*)    All other components within normal limits  CULTURE, BLOOD (ROUTINE X 2)  CULTURE, BLOOD (ROUTINE X 2)  PROCALCITONIN  URINALYSIS, ROUTINE W REFLEX MICROSCOPIC  BETA-HYDROXYBUTYRIC ACID  BETA-HYDROXYBUTYRIC ACID  BETA-HYDROXYBUTYRIC ACID  LACTIC ACID, PLASMA  I-STAT BETA HCG BLOOD, ED ([facility], [facility], [facility] ONLY)  I-STAT VENOUS BLOOD GAS, ED    EKG None  Radiology DG Chest Portable 1 View  Result Date: 07/28/2020 CLINICAL DATA:  Shortness of breath EXAM: PORTABLE CHEST 1 VIEW COMPARISON:  11/10/2019 FINDINGS: Heart and mediastinal contours are within normal limits. No focal opacities or effusions. No acute bony abnormality. IMPRESSION: No active disease. Electronically Signed   By: Rolm Baptise M.D.   On: 07/28/2020 19:40    Procedures .Critical Care Performed by: Corena Herter, PA-C Authorized by: Corena Herter, PA-C   Critical care provider statement:    Critical care time (minutes):  60   Critical care was necessary to  treat or prevent imminent or life-threatening deterioration of the following conditions:  Metabolic crisis and respiratory failure   Critical care was time spent personally by me on the following activities:  Discussions with consultants, evaluation of patient's response to treatment, examination of patient, ordering and performing treatments and interventions, ordering and review of laboratory studies, ordering and review of radiographic studies, pulse oximetry, re-evaluation of patient's condition, obtaining history from patient or surrogate and review of old charts Comments:     DKA. COVID-19 hypoxia.   (including critical care time)  Medications Ordered in ED Medications  albuterol (VENTOLIN HFA) 108 (90 Base) MCG/ACT inhaler 2 puff (has no administration in time range)  insulin aspart (novoLOG) injection 10 Units (10 Units Subcutaneous Not Given 07/29/20 0735)  insulin regular, human (MYXREDLIN) 100 units/ 100 mL infusion (9 Units/hr Intravenous Rate/Dose Change 07/29/20 0924)  lactated ringers infusion (has no administration in time range)  dextrose 5 % in lactated ringers infusion (0 mLs Intravenous Hold 07/29/20 0853)  dextrose 50 % solution 0-50 mL (has no administration in time range)  potassium chloride 10 mEq in 100 mL IVPB (  10 mEq Intravenous New Bag/Given 07/29/20 0925)  lactated ringers bolus 1,270 mL (1,270 mLs Intravenous New Bag/Given 07/29/20 7001)    ED Course  I have reviewed the triage vital signs and the nursing notes.  Pertinent labs & imaging results that were available during my care of the patient were reviewed by me and considered in my medical decision making (see chart for details).  Clinical Course as of 07/29/20 1043  Mon Jul 29, 2020  0930 Spoke with Shirlee Limerick from critical care who will come see the patient. [GG]    Clinical Course User Index [GG] Corena Herter, PA-C   MDM Rules/Calculators/A&P                          Patient with DKA, metabolic acidosis  with elevated anion gap.  Also noted to have leukocytosis, elevated lactic acid at 2.9, and hyperglycemia greater than 600.  She is mentating appropriately.  She was however also found to be COVID-19 positive with associated elevated inflammatory markers.  Repeat CMP demonstrates AKI with labs obtained when she first arrived to the ED.  Creatinine elevated 2.1 and GFR reduced to 26, from 1.29 and 48 respectively.  This is prerenal azotemia and will proceed with aggressive fluid resuscitation despite her COVID-19 diagnosis.  Plain films of chest do not demonstrate any evidence of pneumonia, although she is dry.  Patient was placed on 2 L supplemental O2 she was 88-90% on RA.  Will admit to hospitalist for continued stabilization.  I suspect that her leukocytosis is due to hemoconcentration and DKA, lower suspicion for superimposed bacterial infection.  UA however is pending.   Spoke with Shirlee Limerick from critical care who will come see the patient.   Final Clinical Impression(s) / ED Diagnoses Final diagnoses:  Diabetic ketoacidosis without coma associated with type 2 diabetes mellitus ([place])  COVID-19  AKI (acute kidney injury) [facility])    Rx / DC Orders ED Discharge Orders    None       Corena Herter, PA-C 08/05/20 7494    Little, Wenda Overland, MD 08/06/20 1359

## 2020-07-29 NOTE — ED Notes (Signed)
Lunch Tray Ordered 1115. 

## 2020-07-29 NOTE — ED Notes (Signed)
PA made aware of recent O2 sats dropping

## 2020-07-29 NOTE — Hospital Course (Signed)
Admitted 07/28/2020  Allergies: Tramadol Pertinent Hx: *** type II DM, adrenal insufficiency, and chronic pain syndrome   60 y.o. female p/w ***SOB  * ***Chest pain and shortness of breath: *COVID-19: Unvaccinated. *DKA: Has been off of diabetes medications for a while.    Consults: ***  Meds: *** VTE ppx: *** IVF: *** Diet: ***   []  Follow-up COVID***         HPI: 60 year old female with past medical history of type II DM (noncompliant to medications), adrenal insufficiency, and chronic pain syndrome, who presented with chest pain and shortness of breath.      ED course: Patient was tachycardic in the 140s and hypoxic at*** on arrival.  COVID test came back positive.  And also her labs showed****consistent with DKA.  She was placed on 2 L oxygen through nasal cannula and Endo tool ordered for DKA.  She is unvaccinated against COVID.  IMTS was consulted for admission.     Home meds: Patient not taking: Hydrocortisone 0.1 mg daily (patient not taking, Lipitor Chest x-ray: No active cardiopulmonary disease.    Assessment and plan:   Patient was in acute distress and ill-appearing.  ED provider and also tachycardic in the 140 on arrival. O2 saturation***.  She was placed on 2 L oxygen through nasal cannula. She was found to be in DKA and found to be positive for COVID.Marland Kitchen She is unvaccinated for COVID.      COVID-19 infection: Presented with shortness of breath.  Her oxygen saturation dropped to 86% in ED. she required 35 L high flow nasal cannula of oxygen for O2 saturation of 95-93% She is unvaccinated against COVID-19 Currently on 2 L of nasal oxygen and saturating *** Chest x-ray is unremarkable.  -Airborne and contact precautions -Daily inflammatory labs -***

## 2020-07-30 DIAGNOSIS — E1110 Type 2 diabetes mellitus with ketoacidosis without coma: Secondary | ICD-10-CM

## 2020-07-30 LAB — BASIC METABOLIC PANEL
Anion gap: 11 (ref 5–15)
Anion gap: 12 (ref 5–15)
Anion gap: 12 (ref 5–15)
Anion gap: 8 (ref 5–15)
Anion gap: 8 (ref 5–15)
BUN: 15 mg/dL (ref 6–20)
BUN: 16 mg/dL (ref 6–20)
BUN: 22 mg/dL — ABNORMAL HIGH (ref 6–20)
BUN: 23 mg/dL — ABNORMAL HIGH (ref 6–20)
BUN: 28 mg/dL — ABNORMAL HIGH (ref 6–20)
CO2: 16 mmol/L — ABNORMAL LOW (ref 22–32)
CO2: 18 mmol/L — ABNORMAL LOW (ref 22–32)
CO2: 20 mmol/L — ABNORMAL LOW (ref 22–32)
CO2: 21 mmol/L — ABNORMAL LOW (ref 22–32)
CO2: 21 mmol/L — ABNORMAL LOW (ref 22–32)
Calcium: 8.3 mg/dL — ABNORMAL LOW (ref 8.9–10.3)
Calcium: 8.7 mg/dL — ABNORMAL LOW (ref 8.9–10.3)
Calcium: 8.4 mg/dL — ABNORMAL LOW (ref 8.9–10.3)
Calcium: 8.3 mg/dL — ABNORMAL LOW (ref 8.9–10.3)
Chloride: 103 mmol/L (ref 98–111)
Chloride: 104 mmol/L (ref 98–111)
Chloride: 105 mmol/L (ref 98–111)
Chloride: 107 mmol/L (ref 98–111)
Chloride: 108 mmol/L (ref 98–111)
Creatinine, Ser: 0.49 mg/dL (ref 0.44–1.00)
Creatinine, Ser: 0.49 mg/dL (ref 0.44–1.00)
Creatinine, Ser: 0.51 mg/dL (ref 0.44–1.00)
Creatinine, Ser: 0.65 mg/dL (ref 0.44–1.00)
Creatinine, Ser: 0.72 mg/dL (ref 0.44–1.00)
GFR, Estimated: >60 mL/min (ref >60)
GFR, Estimated: >60 mL/min (ref >60)
GFR, Estimated: >60 mL/min (ref >60)
GFR, Estimated: >60 mL/min (ref >60)
GFR, Estimated: >60 mL/min (ref >60)
Glucose, Bld: 149 mg/dL — ABNORMAL HIGH (ref 70–99)
Glucose, Bld: 160 mg/dL — ABNORMAL HIGH (ref 70–99)
Glucose, Bld: 174 mg/dL — ABNORMAL HIGH (ref 70–99)
Glucose, Bld: 209 mg/dL — ABNORMAL HIGH (ref 70–99)
Glucose, Bld: 229 mg/dL — ABNORMAL HIGH (ref 70–99)
Potassium: 4 mmol/L (ref 3.5–5.1)
Potassium: 4 mmol/L (ref 3.5–5.1)
Potassium: 4.1 mmol/L (ref 3.5–5.1)
Potassium: 3.4 mmol/L — ABNORMAL LOW (ref 3.5–5.1)
Sodium: 132 mmol/L — ABNORMAL LOW (ref 135–145)
Sodium: 134 mmol/L — ABNORMAL LOW (ref 135–145)
Sodium: 135 mmol/L (ref 135–145)
Sodium: 136 mmol/L (ref 135–145)
Sodium: 137 mmol/L (ref 135–145)

## 2020-07-30 LAB — URINALYSIS, ROUTINE W REFLEX MICROSCOPIC
Bacteria, UA: NONE SEEN
Bilirubin Urine: NEGATIVE
Glucose, UA: >=500 mg/dL — AB
Ketones, ur: 20 mg/dL — AB
Leukocytes,Ua: NEGATIVE
Nitrite: NEGATIVE
Protein, ur: 30 mg/dL — AB
Specific Gravity, Urine: 1.024 (ref 1.005–1.030)
pH: 5 (ref 5.0–8.0)

## 2020-07-30 LAB — MAGNESIUM
Magnesium: 1.5 mg/dL — ABNORMAL LOW (ref 1.7–2.4)
Magnesium: 2.2 mg/dL (ref 1.7–2.4)
Magnesium: 1.9 mg/dL (ref 1.7–2.4)

## 2020-07-30 LAB — GLUCOSE, CAPILLARY
Glucose-Capillary: 121 mg/dL — ABNORMAL HIGH (ref 70–99)
Glucose-Capillary: 144 mg/dL — ABNORMAL HIGH (ref 70–99)
Glucose-Capillary: 153 mg/dL — ABNORMAL HIGH (ref 70–99)
Glucose-Capillary: 156 mg/dL — ABNORMAL HIGH (ref 70–99)
Glucose-Capillary: 164 mg/dL — ABNORMAL HIGH (ref 70–99)
Glucose-Capillary: 165 mg/dL — ABNORMAL HIGH (ref 70–99)
Glucose-Capillary: 168 mg/dL — ABNORMAL HIGH (ref 70–99)
Glucose-Capillary: 169 mg/dL — ABNORMAL HIGH (ref 70–99)
Glucose-Capillary: 169 mg/dL — ABNORMAL HIGH (ref 70–99)
Glucose-Capillary: 178 mg/dL — ABNORMAL HIGH (ref 70–99)
Glucose-Capillary: 186 mg/dL — ABNORMAL HIGH (ref 70–99)
Glucose-Capillary: 216 mg/dL — ABNORMAL HIGH (ref 70–99)
Glucose-Capillary: 219 mg/dL — ABNORMAL HIGH (ref 70–99)

## 2020-07-30 LAB — BETA-HYDROXYBUTYRIC ACID
Beta-Hydroxybutyric Acid: 1.17 mmol/L — ABNORMAL HIGH (ref 0.05–0.27)
Beta-Hydroxybutyric Acid: 0.06 mmol/L (ref 0.05–0.27)
Beta-Hydroxybutyric Acid: 0.86 mmol/L — ABNORMAL HIGH (ref 0.05–0.27)

## 2020-07-30 LAB — CORTISOL: Cortisol, Plasma: >100 ug/dL

## 2020-07-30 LAB — PROCALCITONIN: Procalcitonin: 5.76 ng/mL

## 2020-07-30 MED ORDER — LACTATED RINGERS IV SOLN: INTRAVENOUS | Status: DC

## 2020-07-30 MED ORDER — LACTATED RINGERS IV BOLUS
1000.0000 mL | Freq: Once | INTRAVENOUS | Status: AC
  Administered 2020-07-30: 1000 mL via INTRAVENOUS

## 2020-07-30 MED ORDER — BARICITINIB 2 MG PO TABS
4.0000 mg | ORAL_TABLET | Freq: Every day | ORAL | Status: DC
  Administered 2020-07-30 – 2020-08-01 (×3): 4 mg via ORAL
  Filled 2020-07-30 (×3): qty 4

## 2020-07-30 MED ORDER — MELOXICAM 7.5 MG PO TABS
7.5000 mg | ORAL_TABLET | Freq: Every day | ORAL | Status: DC | PRN
Start: 2020-07-30 — End: 2020-08-01
  Administered 2020-07-30: 7.5 mg via ORAL
  Filled 2020-07-30 (×4): qty 1

## 2020-07-30 MED ORDER — METOPROLOL TARTRATE 5 MG/5ML IV SOLN
2.5000 mg | INTRAVENOUS | Status: DC | PRN
  Administered 2020-07-30 (×2): 5 mg via INTRAVENOUS
  Filled 2020-07-30 (×2): qty 5

## 2020-07-30 MED ORDER — INSULIN DETEMIR 100 UNIT/ML ~~LOC~~ SOLN
15.0000 [IU] | Freq: Two times a day (BID) | SUBCUTANEOUS | Status: DC
  Administered 2020-07-30: 15 [IU] via SUBCUTANEOUS
  Filled 2020-07-30 (×2): qty 0.15

## 2020-07-30 MED ORDER — INSULIN ASPART 100 UNIT/ML ~~LOC~~ SOLN
0.0000 [IU] | Freq: Three times a day (TID) | SUBCUTANEOUS | Status: DC
  Administered 2020-07-30: 3 [IU] via SUBCUTANEOUS
  Administered 2020-07-31 (×2): 2 [IU] via SUBCUTANEOUS
  Administered 2020-08-01: 3 [IU] via SUBCUTANEOUS
  Administered 2020-08-01: 8 [IU] via SUBCUTANEOUS
  Administered 2020-08-02: 2 [IU] via SUBCUTANEOUS
  Administered 2020-08-02: 11 [IU] via SUBCUTANEOUS
  Administered 2020-08-03: 5 [IU] via SUBCUTANEOUS
  Administered 2020-08-03: 3 [IU] via SUBCUTANEOUS
  Administered 2020-08-04: 2 [IU] via SUBCUTANEOUS
  Administered 2020-08-04: 15 [IU] via SUBCUTANEOUS
  Administered 2020-08-04: 5 [IU] via SUBCUTANEOUS
  Administered 2020-08-05: 15 [IU] via SUBCUTANEOUS
  Administered 2020-08-05 (×2): 3 [IU] via SUBCUTANEOUS
  Administered 2020-08-06: 11 [IU] via SUBCUTANEOUS
  Administered 2020-08-06: 3 [IU] via SUBCUTANEOUS
  Administered 2020-08-06 – 2020-08-07 (×2): 8 [IU] via SUBCUTANEOUS
  Administered 2020-08-07: 15 [IU] via SUBCUTANEOUS
  Administered 2020-08-07 – 2020-08-08 (×2): 8 [IU] via SUBCUTANEOUS

## 2020-07-30 MED ORDER — MAGNESIUM SULFATE IN D5W 1-5 GM/100ML-% IV SOLN
1.0000 g | Freq: Once | INTRAVENOUS | Status: AC
  Administered 2020-07-30: 1 g via INTRAVENOUS
  Filled 2020-07-30: qty 100

## 2020-07-30 MED ORDER — INSULIN DETEMIR 100 UNIT/ML ~~LOC~~ SOLN
15.0000 [IU] | Freq: Two times a day (BID) | SUBCUTANEOUS | Status: DC
  Administered 2020-07-30 – 2020-08-01 (×5): 15 [IU] via SUBCUTANEOUS
  Filled 2020-07-30 (×8): qty 0.15

## 2020-07-30 MED ORDER — PANTOPRAZOLE SODIUM 40 MG IV SOLR
40.0000 mg | Freq: Every day | INTRAVENOUS | Status: DC
  Administered 2020-07-30 (×2): 40 mg via INTRAVENOUS
  Filled 2020-07-30 (×2): qty 40

## 2020-07-30 MED ORDER — INSULIN ASPART 100 UNIT/ML ~~LOC~~ SOLN
0.0000 [IU] | Freq: Every day | SUBCUTANEOUS | Status: DC
  Administered 2020-07-30: 2 [IU] via SUBCUTANEOUS
  Administered 2020-08-02: 4 [IU] via SUBCUTANEOUS
  Administered 2020-08-03: 2 [IU] via SUBCUTANEOUS
  Administered 2020-08-04: 3 [IU] via SUBCUTANEOUS
  Administered 2020-08-05: 5 [IU] via SUBCUTANEOUS
  Administered 2020-08-06: 3 [IU] via SUBCUTANEOUS
  Administered 2020-08-07: 4 [IU] via SUBCUTANEOUS
  Administered 2020-08-08: 2 [IU] via SUBCUTANEOUS
  Administered 2020-08-09: 3 [IU] via SUBCUTANEOUS
  Administered 2020-08-10: 2 [IU] via SUBCUTANEOUS

## 2020-07-30 MED ORDER — INSULIN ASPART 100 UNIT/ML ~~LOC~~ SOLN
4.0000 [IU] | Freq: Three times a day (TID) | SUBCUTANEOUS | Status: DC
  Administered 2020-08-01 (×3): 4 [IU] via SUBCUTANEOUS

## 2020-07-30 MED ORDER — ALUM & MAG HYDROXIDE-SIMETH 200-200-20 MG/5ML PO SUSP
30.0000 mL | ORAL | Status: DC | PRN
  Administered 2020-07-31: 30 mL via ORAL
  Filled 2020-07-30: qty 30

## 2020-07-30 MED ORDER — FAMOTIDINE IN NACL 20-0.9 MG/50ML-% IV SOLN
20.0000 mg | Freq: Once | INTRAVENOUS | Status: AC
  Administered 2020-07-30: 20 mg via INTRAVENOUS
  Filled 2020-07-30 (×2): qty 50

## 2020-07-30 MED ORDER — METOPROLOL TARTRATE 5 MG/5ML IV SOLN
2.5000 mg | Freq: Once | INTRAVENOUS | Status: AC
  Administered 2020-07-30: 2.5 mg via INTRAVENOUS
  Filled 2020-07-30: qty 5

## 2020-07-30 MED ORDER — INSULIN ASPART 100 UNIT/ML ~~LOC~~ SOLN
2.0000 [IU] | SUBCUTANEOUS | Status: DC
  Administered 2020-07-30: 4 [IU] via SUBCUTANEOUS

## 2020-07-30 NOTE — Progress Notes (Signed)
Per pharmacy and MD patient given 15 units Levemir to transition patient from [place] at 1327. Endotool however, stated to give 17 units per pharmacy and MD ordered Levemir was given rather than Endotool recommendations.

## 2020-07-30 NOTE — Progress Notes (Signed)
NAME:  [name] [name], MRN:  [id number], DOB:  02/29/1961, LOS: 1 ADMISSION DATE:  07/28/2020, CONSULTATION DATE:  1/10 REFERRING MD:  Dr. Rex Kras, CHIEF COMPLAINT:  COVID/DKA   Brief History:  60 year female presented with SOB. Admitted for DKA, but while still in ED became hypoxic after IVF bolus. COVID-19 positive.   History of Present Illness:  60 year old female with PMH as below, which is significant for DM, chronic pain syndrome, and HTN. Presented from home with complaints of SOB and generalized malaise for approximately one week. She has been poorly compliant with insulin during that time due to overall ill feeling and poor PO intake. Sick contact in the home with COVID-19 exposure 12/25. Presented to ED 1/9 PM and was brought back to room 1/10 AM. O2 sats 99% on room air at that time. Initial workup concerning for DKA and she was given 2.5L IVF resuscitation and was started on insulin infusion. She then became hypoxic requiring escalation to 100% 35LPM heated hight flow. CXR essentially clear. PCCM asked to admit.   Past Medical History:   has a past medical history of Cervical disc disorder with radiculopathy of cervical region, Chronic pain syndrome, Degenerative joint disease, Depression (dx 1997), Diabetes mellitus, type 2 ([place]) (2011), Diabetic polyneuropathy ([place]), GERD (gastroesophageal reflux disease), Glaucoma, Headache(784.0), Hypertension, Lumbar herniated disc, Non-compliance, Pancreatic cyst, Pneumonia (08/02/2012), Shingles, Tooth loss, Torn meniscus, and Vertigo.   Significant Hospital Events:  1/9 presented to ED 1/10 admit for DKA, COVID-19 PNA. No evidence of DVT and no evidence of R heart strain on ECHO to explain tachycardia. Put on doxy (later changed to azithro) and rocephin for possible CAP 1/11 more IVF-- BHB increasing. Cont on insulin gtt   Consults:    Procedures:    Significant Diagnostic Tests:  1/10 ECHO>LVEF 65-70%  1/10 lower ext dopplers> no  evidence of DVT    Micro Data:  1/10 COVID positive  Antimicrobials:  Doxy 1/10 --> Azithro1/11> Rocephin 1/10>   Interim History / Subjective:  Still very tachycardic  ECHO and BLE dopplers not suggestive of DVT or R heart strain (PE)   BHB has increased overnight from 0.59 to 1.17 Endotool prompting transition off insulin gtt however with rising BHB, continued appropriately Objective   Blood pressure 120/70, pulse (!) 137, temperature 99.7 F (37.6 C), temperature source Oral, resp. rate (!) 22, height 5\' 6"  (1.676 m), weight 56.2 kg, SpO2 91 %.    FiO2 (%):  [50 %-100 %] 50 %   Intake/Output Summary (Last 24 hours) at 07/30/2020 0813 Last data filed at 07/30/2020 0600 Gross per 24 hour  Intake 4332.47 ml  Output 1300 ml  Net 3032.47 ml   Filed Weights   07/29/20 0600 07/29/20 1445  Weight: 63.5 kg 56.2 kg    Examination: General: Chronically and acutely ill appearing middle aged F, reclined in bed NAD  HENT: NCAT pink mmm but scalloped edges of tongue. Anicteric sclera  Lungs: Slightly incr RR. Symmetrical chest expansion, even unlabored respirations. Slightly coarse but no rhonchi or wheeze.  Cardiovascular: tachy rate reg rhythm s1s2 2+ radial pulses  Abdomen: soft round ndnt +bowel sounds  Extremities: No obvious joint deformity no cyanosis or clubbing. No edema  Neuro: Drowsy, awakens, follows commands. PERRL Skin: c/d/w. Mild + turgor   Resolved Hospital Problem list     Assessment & Plan:   DKA  NAGMA - in setting of DKA P -will give additional LR bolus (looks like has only  had approx 3L since presentation -cont mIVF -On endotool --continuing insulin gtt with elevated beta hydroxB (BHB has actually increased overnight)  -cont to trend BMP, mag, phos, BHB   Acute hypoxic respiratory failure due to COVID 19 infection  P - Supplemental O2 for SpO2 > 85 at rest -- currently on HHFNC  -steroids started -- continue to eval risk/benefit in setting of DKA   - No linagliptan due to DKA  - encourage IS, awake proning - PRN CXR   Chronic pain  - holding opioids, holding home meloxicam  -PRN APAP. Can consider lido patches if needed for back pain   AKI, improved  -suspect prerenal / hypovolemic etiology 2/2 DKA P - Cont IVF  -Trend renal indices. UOP   Leukocytosis -reactive vs infectious P -empiric CAP coverage, favor short course -trend WBC (may be clouded picture however with steroids for COVID-19), fever curve.   Hx Adrenal insufficiency - check cortisol, florinef held at admission    Best practice (evaluated daily)  Diet: NPO  Pain/Anxiety/Delirium protocol (if indicated): NA VAP protocol (if indicated): NA DVT prophylaxis: Lovenox GI prophylaxis: NA Glucose control: Endotool Mobility: BR Disposition:ICU  Goals of Care:  Last date of multidisciplinary goals of care discussion: Family and staff present:  Summary of discussion:  Follow up goals of care discussion due: 1/17 Code Status: FULL  Labs   CBC: Recent Labs  Lab 07/28/20 1809 07/29/20 0917  WBC 20.0*  --   HGB 14.5 13.3  HCT 43.3 39.0  MCV 93.1  --   PLT 282  --     Basic Metabolic Panel: Recent Labs  Lab 07/29/20 0730 07/29/20 0917 07/29/20 1231 07/29/20 1608 07/29/20 2337 07/30/20 0326  NA 132* 137 135 135 132* 137  K 4.4 4.2 3.7 3.3* 4.0 4.0  CL 89*  --  100 100 104 108  CO2 11*  --  19* 21* 16* 21*  GLUCOSE 775*  --  325* 254* 174* 160*  BUN 39*  --  33* 32* 28* 23*  CREATININE 2.10*  --  1.18* 1.07* 0.72 0.65  CALCIUM 9.3  --  9.0 9.2 8.3* 8.7*   GFR: Estimated Creatinine Clearance: 66.3 mL/min (by C-G formula based on SCr of 0.65 mg/dL). Recent Labs  Lab 07/28/20 1809 07/29/20 0730 07/29/20 0908 07/29/20 1233  PROCALCITON  --  34.74  --   --   WBC 20.0*  --   --   --   LATICACIDVEN  --  2.9* 2.4* 2.0*    Liver Function Tests: Recent Labs  Lab 07/29/20 0730  AST 22  ALT 30  ALKPHOS 190*  BILITOT 1.9*  PROT 8.0   ALBUMIN 3.6   No results for input(s): LIPASE, AMYLASE in the last 168 hours. No results for input(s): AMMONIA in the last 168 hours.  ABG    Component Value Date/Time   HCO3 13.4 (L) 07/29/2020 0917   TCO2 14 (L) 07/29/2020 0917   ACIDBASEDEF 11.0 (H) 07/29/2020 0917   O2SAT 87.0 07/29/2020 0917     Coagulation Profile: No results for input(s): INR, PROTIME in the last 168 hours.  Cardiac Enzymes: No results for input(s): CKTOTAL, CKMB, CKMBINDEX, TROPONINI in the last 168 hours.  HbA1C: HbA1c POC (<> result, manual entry)  Date/Time Value Ref Range Status  01/25/2018 01:36 PM >15.0 4.0 - 5.6 % Final   Hgb A1c MFr Bld  Date/Time Value Ref Range Status  07/29/2020 12:31 PM 12.3 (H) 4.8 - 5.6 % Final  Comment:    (NOTE) Pre diabetes:          5.7%-6.4%  Diabetes:              >6.4%  Glycemic control for   <7.0% adults with diabetes   02/24/2020 05:41 PM 14.8 (H) 4.8 - 5.6 % Final    Comment:    (NOTE) Pre diabetes:          5.7%-6.4%  Diabetes:              >6.4%  Glycemic control for   <7.0% adults with diabetes     CBG: Recent Labs  Lab 07/30/20 0018 07/30/20 0220 07/30/20 0325 07/30/20 0529 07/30/20 0745  GLUCAP 168* 169* 156* 144* 219*    CRITICAL CARE Performed by: Cristal Generous   Total critical care time: 42 minutes  Critical care time was exclusive of separately billable procedures and treating other patients.  Critical care was necessary to treat or prevent imminent or life-threatening deterioration.  Critical care was time spent personally by me on the following activities: development of treatment plan with patient and/or surrogate as well as nursing, discussions with consultants, evaluation of patient's response to treatment, examination of patient, obtaining history from patient or surrogate, ordering and performing treatments and interventions, ordering and review of laboratory studies, ordering and review of radiographic studies,  pulse oximetry and re-evaluation of patient's condition.  Eliseo Gum MSN, AGACNP-BC [place] [id number] If no answer, [id number] 07/30/2020, 8:14 AM

## 2020-07-30 NOTE — Progress Notes (Signed)
PCCM Family communication  Multiple attempts to reach family 1/11 have not been successful. Will continue to attempt throughout the day as able.    Eliseo Gum MSN, AGACNP-BC [facility] 07/30/2020, 10:52 AM

## 2020-07-30 NOTE — Progress Notes (Signed)
eLink Physician-Brief Progress Note Patient Name: LEXI CONATY DOB: [birth year] MRN: [id number]   Date of Service  07/30/2020  HPI/Events of Note  Patient c/o heartburn. Not able to take PO well.  eICU Interventions  Plan: 1. Protonix 40 mg iV now and Q day. 2. Pepcid 20 mg IV X 1 now.  3 Mylanta 30 mL PO now.      Intervention Category Major Interventions: Other:  Lysle Dingwall 07/30/2020, 7:38 PM

## 2020-07-30 NOTE — Progress Notes (Signed)
RT note. Pt. Placed on 10L [place] at this time, sat 98%. RT will continue to monitor.

## 2020-07-30 NOTE — Progress Notes (Signed)
[facility] Progress Note Patient Name: [name] [name] DOB: May 21, 1961 MRN: [id number]   Date of Service  07/30/2020  HPI/Events of Note  EndoTool is prompting to transition to Levemir + Novolog SSI. However, HCO3- = 16 and Beta Hydroxybuterate = 1.17 (Markedly elevated).   eICU Interventions  Continue insulin IV infusion.      Intervention Category Major Interventions: Hyperglycemia - active titration of insulin therapy  Shiri Hodapp [place] 07/30/2020, 2:13 AM

## 2020-07-31 ENCOUNTER — Inpatient Hospital Stay (HOSPITAL_COMMUNITY): Payer: HRSA Program

## 2020-07-31 LAB — CBC
HCT: 33.9 % — ABNORMAL LOW (ref 36.0–46.0)
Hemoglobin: 11.4 g/dL — ABNORMAL LOW (ref 12.0–15.0)
MCH: 30.4 pg (ref 26.0–34.0)
MCHC: 33.6 g/dL (ref 30.0–36.0)
MCV: 90.4 fL (ref 80.0–100.0)
Platelets: 160 10*3/uL (ref 150–400)
RBC: 3.75 MIL/uL — ABNORMAL LOW (ref 3.87–5.11)
RDW: 13 % (ref 11.5–15.5)
WBC: 7.9 10*3/uL (ref 4.0–10.5)
nRBC: 0 % (ref 0.0–0.2)

## 2020-07-31 LAB — CALCIUM, IONIZED: Calcium, Ionized, Serum: 5 mg/dL (ref 4.5–5.6)

## 2020-07-31 LAB — GLUCOSE, CAPILLARY
Glucose-Capillary: 128 mg/dL — ABNORMAL HIGH (ref 70–99)
Glucose-Capillary: 135 mg/dL — ABNORMAL HIGH (ref 70–99)
Glucose-Capillary: 106 mg/dL — ABNORMAL HIGH (ref 70–99)
Glucose-Capillary: 163 mg/dL — ABNORMAL HIGH (ref 70–99)

## 2020-07-31 LAB — MAGNESIUM
Magnesium: 2 mg/dL (ref 1.7–2.4)
Magnesium: 1.9 mg/dL (ref 1.7–2.4)
Magnesium: 2 mg/dL (ref 1.7–2.4)

## 2020-07-31 LAB — PROCALCITONIN: Procalcitonin: 4.2 ng/mL

## 2020-07-31 MED ORDER — POTASSIUM CHLORIDE CRYS ER 20 MEQ PO TBCR
30.0000 meq | EXTENDED_RELEASE_TABLET | Freq: Two times a day (BID) | ORAL | Status: AC
  Administered 2020-07-31 (×2): 30 meq via ORAL
  Filled 2020-07-31 (×2): qty 1

## 2020-07-31 MED ORDER — ONDANSETRON HCL 4 MG/2ML IJ SOLN
4.0000 mg | Freq: Four times a day (QID) | INTRAMUSCULAR | Status: DC | PRN
  Administered 2020-08-01 (×2): 4 mg via INTRAVENOUS
  Filled 2020-07-31 (×2): qty 2

## 2020-07-31 MED ORDER — PANTOPRAZOLE SODIUM 40 MG PO TBEC
40.0000 mg | DELAYED_RELEASE_TABLET | Freq: Every day | ORAL | Status: DC
  Administered 2020-07-31 – 2020-08-08 (×9): 40 mg via ORAL
  Filled 2020-07-31 (×9): qty 1

## 2020-07-31 MED ORDER — PROMETHAZINE HCL 25 MG/ML IJ SOLN
12.5000 mg | Freq: Four times a day (QID) | INTRAMUSCULAR | Status: DC | PRN
  Administered 2020-07-31: 12.5 mg via INTRAVENOUS
  Filled 2020-07-31: qty 1

## 2020-07-31 MED ORDER — ENSURE ENLIVE PO LIQD
237.0000 mL | Freq: Two times a day (BID) | ORAL | Status: DC
  Administered 2020-07-31 – 2020-08-09 (×19): 237 mL via ORAL

## 2020-07-31 MED ORDER — ACETAMINOPHEN 325 MG PO TABS
650.0000 mg | ORAL_TABLET | Freq: Four times a day (QID) | ORAL | Status: DC | PRN
  Administered 2020-07-31 – 2020-08-01 (×2): 650 mg via ORAL
  Filled 2020-07-31 (×3): qty 2

## 2020-07-31 MED ORDER — GABAPENTIN 400 MG PO CAPS
400.0000 mg | ORAL_CAPSULE | Freq: Two times a day (BID) | ORAL | Status: DC
  Administered 2020-07-31 – 2020-08-01 (×4): 400 mg via ORAL
  Filled 2020-07-31 (×4): qty 1

## 2020-07-31 MED ORDER — DULOXETINE HCL 60 MG PO CPEP
60.0000 mg | ORAL_CAPSULE | Freq: Two times a day (BID) | ORAL | Status: DC
Start: 2020-07-31 — End: 2020-08-13
  Administered 2020-07-31 – 2020-08-12 (×26): 60 mg via ORAL
  Filled 2020-07-31 (×26): qty 1

## 2020-07-31 MED ORDER — ONDANSETRON HCL 4 MG/2ML IJ SOLN
INTRAMUSCULAR | Status: AC
  Administered 2020-07-31: 4 mg via INTRAVENOUS
  Filled 2020-07-31: qty 2

## 2020-07-31 NOTE — Progress Notes (Addendum)
NAME:  [name] [name], MRN:  [id number], DOB:  02-Mar-1961, LOS: 2 ADMISSION DATE:  07/28/2020, CONSULTATION DATE:  1/10 REFERRING MD:  Dr. Rex Kras, CHIEF COMPLAINT:  COVID/DKA   Brief History:  60 year female presented with SOB. Admitted for DKA, but while still in ED became hypoxic after IVF bolus. COVID-19 positive.   History of Present Illness:  60 year old female with PMH as below, which is significant for DM, chronic pain syndrome, and HTN. Presented from home with complaints of SOB and generalized malaise for approximately one week. She has been poorly compliant with insulin during that time due to overall ill feeling and poor PO intake. Sick contact in the home with COVID-19 exposure 12/25. Presented to ED 1/9 PM and was brought back to room 1/10 AM. O2 sats 99% on room air at that time. Initial workup concerning for DKA and she was given 2.5L IVF resuscitation and was started on insulin infusion. She then became hypoxic requiring escalation to 100% 35LPM heated hight flow. CXR essentially clear. PCCM asked to admit.   Past Medical History:   has a past medical history of Cervical disc disorder with radiculopathy of cervical region, Chronic pain syndrome, Degenerative joint disease, Depression (dx 1997), Diabetes mellitus, type 2 ([place]) (2011), Diabetic polyneuropathy ([place]), GERD (gastroesophageal reflux disease), Glaucoma, Headache(784.0), Hypertension, Lumbar herniated disc, Non-compliance, Pancreatic cyst, Pneumonia (08/02/2012), Shingles, Tooth loss, Torn meniscus, and Vertigo.   Significant Hospital Events:  1/9 presented to ED 1/10 admit for DKA, COVID-19 PNA. No evidence of DVT and no evidence of R heart strain on ECHO to explain tachycardia. Put on doxy (later changed to azithro) and rocephin for possible CAP 1/11 more IVF-- BHB increasing. Cont on insulin gtt   Consults:    Procedures:    Significant Diagnostic Tests:  1/10 ECHO>LVEF 65-70%  1/10 lower ext dopplers> no  evidence of DVT  1/12 CXR> bibasilar infiltrates, small pleural effusions  Micro Data:  1/10 COVID positive 1/10 BCx> no growth 2 days  Antimicrobials:  Doxy 1/10 --> Azithro1/11> Rocephin 1/10>   Interim History / Subjective:   Transitioned off of insulin gtt 1/11 This morning complaining of pain and cough   Objective   Blood pressure 136/75, pulse (!) 122, temperature 97.9 F (36.6 C), temperature source Oral, resp. rate (!) 26, height 5\' 6"  (1.676 m), weight 63.5 kg, SpO2 92 %.    FiO2 (%):  [40 %-50 %] 40 %   Intake/Output Summary (Last 24 hours) at 07/31/2020 1140 Last data filed at 07/31/2020 0800 Gross per 24 hour  Intake 2776.52 ml  Output --  Net 2776.52 ml   Filed Weights   07/29/20 0600 07/29/20 1445 07/31/20 0600  Weight: 63.5 kg 56.2 kg 63.5 kg    Examination: General: Chronically and acutely ill middle aged F seated in bed drowsy NAD  HENT: NCAT pink mm anicteric sclera.  Lungs: Symmetrical chest expansion, even unlabored respirations. Clear anterior sounds  Cardiovascular: rrr s1s2 no rgm. Cap refill brisk Abdomen: Soft round ndnt + bowel sounds  Extremities: No obvious joint deformity no cyanosis or clubbing no peripheral edema   Neuro: Drowsy, awakens to voice follows commands  Skin: c/d/w no rash   Resolved Hospital Problem list   DKA NAGMA AKI Assessment & Plan:    Acute hypoxic respiratory failure due to COVID-19 PNA Possible CAP  P - Supplemental O2  - solumedrol, baricitinib,  -empiric CAP-- azithromycin and rocephin  -IS   DM -DKA improved, transitioned off  of insulin gtt P -cont IVF -PRN zofran for nausea -SSI + levemir 15units q12hr  Nausea P -PRN zofran -cont mIVF   Chronic pain - resuming home mobic and gabapentin   Hx Adrenal insufficiency - check cortisol, florinef held at admission  -on solumedrol for COVID   Best practice (evaluated daily)  Diet: carb mod  Pain/Anxiety/Delirium protocol (if indicated): home  mobic, home gapa, PRN APAP VAP protocol (if indicated): NA DVT prophylaxis: Lovenox GI prophylaxis: NA Glucose control: SSI + basal  Mobility: BR Disposition: Stable for transfer to medsurg. Will ask [facility] to resume care with PCCM off 1/13   Goals of Care:  Last date of multidisciplinary goals of care discussion: Family and staff present:  Summary of discussion:  Follow up goals of care discussion due: 1/17 Code Status: FULL  Labs   CBC: Recent Labs  Lab 07/28/20 1809 07/29/20 0917 07/31/20 0336  WBC 20.0*  --  7.9  HGB 14.5 13.3 11.4*  HCT 43.3 39.0 33.9*  MCV 93.1  --  90.4  PLT 282  --  578    Basic Metabolic Panel: Recent Labs  Lab 07/30/20 0326 07/30/20 0733 07/30/20 1017 07/30/20 1632 07/30/20 2010 07/31/20 0336 07/31/20 0744  NA 137 136  --  135 134* 135  --   K 4.0 4.1  --  3.4* 3.5 3.6  --   CL 108 107  --  103 105 104  --   CO2 21* 18*  --  20* 21* 22  --   GLUCOSE 160* 209*  --  149* 229* 151*  --   BUN 23* 22*  --  16 15 14   --   CREATININE 0.65 0.51  --  0.49 0.49 0.47  --   CALCIUM 8.7* 8.4*  --  8.3* 8.2* 8.3*  --   MG  --   --  1.5* 2.2 1.9 2.0 1.9   GFR: Estimated Creatinine Clearance: 70 mL/min (by C-G formula based on SCr of 0.47 mg/dL). Recent Labs  Lab 07/28/20 1809 07/29/20 0730 07/29/20 0908 07/29/20 1233 07/30/20 1632 07/31/20 0336  PROCALCITON  --  34.74  --   --  5.76 4.20  WBC 20.0*  --   --   --   --  7.9  LATICACIDVEN  --  2.9* 2.4* 2.0*  --   --     Liver Function Tests: Recent Labs  Lab 07/29/20 0730  AST 22  ALT 30  ALKPHOS 190*  BILITOT 1.9*  PROT 8.0  ALBUMIN 3.6   No results for input(s): LIPASE, AMYLASE in the last 168 hours. No results for input(s): AMMONIA in the last 168 hours.  ABG    Component Value Date/Time   HCO3 13.4 (L) 07/29/2020 0917   TCO2 14 (L) 07/29/2020 0917   ACIDBASEDEF 11.0 (H) 07/29/2020 0917   O2SAT 87.0 07/29/2020 0917     Coagulation Profile: No results for input(s): INR,  PROTIME in the last 168 hours.  Cardiac Enzymes: No results for input(s): CKTOTAL, CKMB, CKMBINDEX, TROPONINI in the last 168 hours.  HbA1C: HbA1c POC (<> result, manual entry)  Date/Time Value Ref Range Status  01/25/2018 01:36 PM >15.0 4.0 - 5.6 % Final   Hgb A1c MFr Bld  Date/Time Value Ref Range Status  07/29/2020 12:31 PM 12.3 (H) 4.8 - 5.6 % Final    Comment:    (NOTE) Pre diabetes:          5.7%-6.4%  Diabetes:              >  6.4%  Glycemic control for   <7.0% adults with diabetes   02/24/2020 05:41 PM 14.8 (H) 4.8 - 5.6 % Final    Comment:    (NOTE) Pre diabetes:          5.7%-6.4%  Diabetes:              >6.4%  Glycemic control for   <7.0% adults with diabetes     CBG: Recent Labs  Lab 07/30/20 1435 07/30/20 1605 07/30/20 1838 07/30/20 2118 07/31/20 0817  GLUCAP 121* 153* 164* 216* 128*    CCT: n/a   Eliseo Gum MSN, AGACNP-BC [place] [id number] If no answer, [id number] 07/31/2020, 11:40 AM

## 2020-07-31 NOTE — Progress Notes (Signed)
Initial Nutrition Assessment  DOCUMENTATION CODES:   Not applicable  INTERVENTION:   Ensure Enlive po BID, each supplement provides 350 kcal and 20 grams of protein   NUTRITION DIAGNOSIS:   Inadequate oral intake related to acute illness as evidenced by NPO status.  GOAL:   Patient will meet greater than or equal to 90% of their needs  MONITOR:   PO intake,Supplement acceptance,Labs,Weight trends  REASON FOR ASSESSMENT:   Malnutrition Screening Tool    ASSESSMENT:   60 yo female admitted with DKA, COVID+ . PMH DM, chronic pain syndrome, HTN   Off insulin drip, currently tolerating room air  Recorded po intake 25% at breakfast this AM  Current wt 63.5 kg  Labs: reviewed Meds: ss novolog, novolog with meals, levemir, solumedrol, KCl  Diet Order:   Diet Order            Diet Carb Modified Fluid consistency: Thin; Room service appropriate? Yes  Diet effective now                 EDUCATION NEEDS:   Not appropriate for education at this time  Skin:  Skin Assessment: Reviewed RN Assessment  Last BM:  1/11  Height:   Ht Readings from Last 1 Encounters:  07/29/20 5\' 6"  (1.676 m)    Weight:   Wt Readings from Last 1 Encounters:  07/31/20 63.5 kg     BMI:  Body mass index is 22.6 kg/m.  Estimated Nutritional Needs:   Kcal:  1800-2000 kcals  Protein:  90-100 g  Fluid:  >/= 1.8 L   Kerman Passey MS, RDN, LDN, CNSC Registered Dietitian III Clinical Nutrition RD Pager and On-Call Pager Number Located in [place]

## 2020-07-31 NOTE — Plan of Care (Signed)

## 2020-07-31 NOTE — Progress Notes (Signed)
[facility] Progress Note Patient Name: [name] [name] DOB: 04/28/1961 MRN: [id number]   Date of Service  07/31/2020  HPI/Events of Note  Taking PO diet - Nursing request to change rectal Tylenol to PO Tylenol.   eICU Interventions  Will change Tylenol to 650 mg PO Q 6 hours PRN Temp > 101.0 F or moderate pain.      Intervention Category Major Interventions: Other:  Lysle Dingwall 07/31/2020, 3:58 AM

## 2020-08-01 ENCOUNTER — Encounter (HOSPITAL_COMMUNITY): Payer: Self-pay | Admitting: Pulmonary Disease

## 2020-08-01 DIAGNOSIS — R059 Cough, unspecified: Secondary | ICD-10-CM

## 2020-08-01 LAB — GLUCOSE, CAPILLARY
Glucose-Capillary: 114 mg/dL — ABNORMAL HIGH (ref 70–99)
Glucose-Capillary: 155 mg/dL — ABNORMAL HIGH (ref 70–99)
Glucose-Capillary: 186 mg/dL — ABNORMAL HIGH (ref 70–99)
Glucose-Capillary: 254 mg/dL — ABNORMAL HIGH (ref 70–99)
Glucose-Capillary: 165 mg/dL — ABNORMAL HIGH (ref 70–99)

## 2020-08-01 LAB — BASIC METABOLIC PANEL
Anion gap: 9 (ref 5–15)
BUN: 22 mg/dL — ABNORMAL HIGH (ref 6–20)
CO2: 22 mmol/L (ref 22–32)
Calcium: 8.1 mg/dL — ABNORMAL LOW (ref 8.9–10.3)
Chloride: 109 mmol/L (ref 98–111)
Creatinine, Ser: 0.49 mg/dL (ref 0.44–1.00)
GFR, Estimated: >60 mL/min (ref >60)
Glucose, Bld: 135 mg/dL — ABNORMAL HIGH (ref 70–99)
Potassium: 4.4 mmol/L (ref 3.5–5.1)
Sodium: 140 mmol/L (ref 135–145)

## 2020-08-01 LAB — C-REACTIVE PROTEIN: CRP: 10.5 mg/dL — ABNORMAL HIGH (ref <1.0)

## 2020-08-01 MED ORDER — PREDNISONE 20 MG PO TABS
50.0000 mg | ORAL_TABLET | Freq: Every day | ORAL | Status: DC
Start: 2020-08-01 — End: 2020-08-05
  Administered 2020-08-01 – 2020-08-05 (×5): 50 mg via ORAL
  Filled 2020-08-01: qty 3
  Filled 2020-08-01: qty 5
  Filled 2020-08-01 (×3): qty 3

## 2020-08-01 MED ORDER — SACCHAROMYCES BOULARDII 250 MG PO CAPS
250.0000 mg | ORAL_CAPSULE | Freq: Two times a day (BID) | ORAL | Status: DC
Start: 2020-08-01 — End: 2020-08-13
  Administered 2020-08-01 – 2020-08-12 (×24): 250 mg via ORAL
  Filled 2020-08-01 (×24): qty 1

## 2020-08-01 MED ORDER — DM-GUAIFENESIN ER 30-600 MG PO TB12
1.0000 | ORAL_TABLET | Freq: Two times a day (BID) | ORAL | Status: DC
  Administered 2020-08-01 – 2020-08-12 (×23): 1 via ORAL
  Filled 2020-08-01 (×24): qty 1

## 2020-08-01 NOTE — Progress Notes (Signed)
   08/01/20 1135  Assess: MEWS Score  Temp 97.7 F (36.5 C)  BP 120/72  Pulse Rate (!) 120  Resp (!) 26  SpO2 94 %  O2 Device Room Air  Assess: MEWS Score  MEWS Temp 0  MEWS Systolic 0  MEWS Pulse 2  MEWS RR 2  MEWS LOC 0  MEWS Score 4  MEWS Score Color Red  Assess: if the MEWS score is Yellow or Red  Were vital signs taken at a resting state? Yes  Focused Assessment No change from prior assessment  Early Detection of Sepsis Score *See Row Information* Low  MEWS guidelines implemented *See Row Information* Yes  Take Vital Signs  Increase Vital Sign Frequency  Red: Q 1hr X 4 then Q 4hr X 4, if remains red, continue Q 4hrs  Notify: Charge Nurse/RN  Name of Charge Nurse/RN Notified Erin, RN  Date Charge Nurse/RN Notified 08/01/20  Time Charge Nurse/RN Notified 0136

## 2020-08-01 NOTE — Progress Notes (Signed)
TR[facility] night shift MedSurg coverage note.  The patient has been coughing and the staff is requesting an antitussive..  Mucinex DM p.o. twice daily ordered.  [name] [name] at these, [place].

## 2020-08-01 NOTE — TOC Initial Note (Signed)
Transition of Care [facility]) - Initial/Assessment Note    Patient Details  Name: [name] [name] MRN: [id number] Date of Birth: 12-Feb-1961  [facility] CM/SW Contact:    Verdell Carmine, RN Phone Number: 08/01/2020, 6:22 PM  Clinical Narrative:                 Patient admitted with COVID pneumonia/DKA requiring high flow oxygen CCM consulted. Patient uninsured, Will likely need oxygen and [place] and DME post hospitalization EDD > 3 days. CM will follow for needs and involve CSW if needed.  Expected Discharge Plan: [place] Barriers to Discharge: Continued Medical Work up   Patient Goals and CMS Choice        Expected Discharge Plan and Services Expected Discharge Plan: [place]       Living arrangements for the past 2 months: Single Family Home                                      Prior Living Arrangements/Services Living arrangements for the past 2 months: Single Family Home   Patient language and need for interpreter reviewed:: Yes        Need for Family Participation in Patient Care: Yes (Comment) Care giver support system in place?: Yes (comment)   Criminal Activity/Legal Involvement Pertinent to Current Situation/Hospitalization: No - Comment as needed  Activities of Daily Living Home Assistive Devices/Equipment: None ADL Screening (condition at time of admission) Patient's cognitive ability adequate to safely complete daily activities?: Yes Is the patient deaf or have difficulty hearing?: No Does the patient have difficulty seeing, even when wearing glasses/contacts?: No Does the patient have difficulty concentrating, remembering, or making decisions?: No Patient able to express need for assistance with ADLs?: Yes Does the patient have difficulty dressing or bathing?: No Independently performs ADLs?: Yes (appropriate for developmental age) Does the patient have difficulty walking or climbing  stairs?: No Weakness of Legs: None Weakness of Arms/Hands: None  Permission Sought/Granted                  Emotional Assessment       Orientation: : Oriented to Self,Oriented to Place,Oriented to  Time,Oriented to Situation Alcohol / Substance Use: Not Applicable Psych Involvement: No (comment)  Admission diagnosis:  DKA (diabetic ketoacidosis) ([place]) [E11.10] Hypoxia [R09.02] AKI (acute kidney injury) ([place]) [N17.9] Diabetic ketoacidosis without coma associated with type 2 diabetes mellitus ([place]) [E11.10] COVID-19 [U07.1] Patient Active Problem List   Diagnosis Date Noted  . DKA (diabetic ketoacidosis) ([place]) 07/29/2020  . Dehydration   . Hyperglycemia   . Preoperative clearance 02/25/2020  . Fracture of medial malleolus, right, closed 02/24/2020  . Hyperglycemia due to type 2 diabetes mellitus ([place]) 02/24/2020  . Autonomic dysfunction 02/24/2020  . Fall 02/24/2020  . Adrenal insufficiency ([place]) 09/07/2019  . Recurrent syncope 09/06/2019  . Sinus tachycardia 09/06/2019  . Hypokalemia 09/06/2019  . Pulmonary nodules 09/06/2019  . Orthostatic hypotension   . Syncope and collapse 06/30/2019  . Hypotension 06/29/2019  . Chronic pain syndrome 08/24/2017  . Diabetic polyneuropathy associated with type 2 diabetes mellitus ([place]) 08/24/2017  . Restless leg syndrome 08/24/2017  . Vertigo 04/09/2016  . Acute sinusitis 06/27/2015  . Cervical disc disorder with radiculopathy of cervical region 02/20/2015  . Neck muscle spasm 10/23/2014  . Healthcare maintenance 10/23/2014  . Depression 11/25/2013  .  Diabetes type 2, uncontrolled ([place])    PCP:  Antony Blackbird, MD (Inactive) Pharmacy:   [facility], [address] [place] [address] Phone: [phone] Fax: [phone]  [place]), [place] - [address] [place] ([place]) [place] [address] Phone: [phone]  Fax: [phone]     Social Determinants of Health (SDOH) Interventions    Readmission Risk Interventions No flowsheet data found.

## 2020-08-01 NOTE — Progress Notes (Signed)
[facility] Progress Note  Patient: [name] [name]    [id number]  DOA: 07/28/2020     Date of Service: the patient was seen and examined on 08/01/2020  Brief hospital course: Past medical history of type II DM, HTN and chronic pain syndrome.  Presents with complaints of shortness of breath and fatigue with poor p.o. intake found to have COVID-19 pneumonia as well as DKA.  Admitted to the ICU. Transfer to [facility] on 1/13 Currently plan is resolution of diarrhea and improvement in oral intake.  Assessment and Plan: 1. Acute Hypoxic respiratory failure, POA, 86 % on room air on admission.  Acute COVID-19 Viral Pneumonia Community-acquired pneumonia Sepsis secondary to viral and community-acquired bacterial pneumonia, POA Meeting SIRS criteria on admission with tachycardia, tachypnea and fever. CXR: hazy bilateral peripheral opacities Oxygen requirement: Currently on room air.  At peak was on heated high flow nasal cannula plus nonrebreather CRP: 28.7-10.5 Remdesivir: Not initiated Steroids: Initially on Solu-Medrol currently on prednisone. Baricitinib/Actemra: Started on 1/11, will discontinue it on 1/13 given concern with bacterial infection Antibiotics: Started on IV azithromycin and ceftriaxone on admission continue to complete a 5-day treatment course. DVT Prophylaxis: enoxaparin (LOVENOX) injection 40 mg Start: 07/30/20 1230  Prone positioning and incentive spirometer use recommended.  The treatment plan and use of medications and known side effects were discussed with patient/family. It was clearly explained that complete risks and long-term side effects are unknown. Patient/family agree with the treatment plan.    2.  DKA Diabetes mellitus, uncontrolled Anion gap resolved with IV fluids and IV insulin. Currently on IV fluids with Also on sliding scale insulin as well as Levemir. Monitor.  3.  Intractable nausea Currently resolved.  Oral intake improving.  4.   Diarrhea Patient reports ongoing diarrhea since she is sick. Today 2 episodes of diarrhea reported by the patient. Likely associated with antibiotics. Add Florastor.  5.  Chronic pain syndrome Continue home regimen.  6.  Adrenal insufficiency Cortisol level significantly elevated with concern Florinef held at admission. Currently on prednisone.  Monitor.  Diet: Carb modified diet DVT Prophylaxis:   enoxaparin (LOVENOX) injection 40 mg Start: 07/30/20 1230    Advance goals of care discussion: Full code  Family Communication: no family was present at bedside, at the time of interview.   Disposition:  Status is: Inpatient  Remains inpatient appropriate because:IV treatments appropriate due to intensity of illness or inability to take PO   Dispo: The patient is from: Home              Anticipated d/c is to: Home              Anticipated d/c date is: 2 days              Patient currently is not medically stable to d/c.        Subjective: Reports diarrhea.  Reports poor p.o. intake.  No nausea vomiting.  Reports fatigue and tiredness.  Physical Exam:  General: Appear in mild distress, no Rash; Oral Mucosa Clear, moist. no Abnormal Neck Mass Or lumps, Conjunctiva normal  Cardiovascular: S1 and S2 Present, no Murmur, Respiratory: increased respiratory effort, Bilateral Air entry present and bilateral  Crackles, no wheezes Abdomen: Bowel Sound present, Soft and no tenderness Extremities: trace Pedal edema Neurology: alert and oriented to time, place, and person affect appropriate. no new focal deficit Gait not checked due to patient safety concerns    Vitals:   08/01/20 1241 08/01/20 1342 08/01/20 1431  08/01/20 1539  BP: 132/74 (!) 134/96 128/71 125/74  Pulse: (!) 117 (!) 108 (!) 120 (!) 116  Resp: (!) 22 (!) 21 (!) 23 (!) 22  Temp: 97.8 F (36.6 C) 98.1 F (36.7 C) 97.7 F (36.5 C) 98.3 F (36.8 C)  TempSrc: Oral Oral Oral Axillary  SpO2: 90% 90% 92% 94%   Weight:      Height:        Intake/Output Summary (Last 24 hours) at 08/01/2020 1824 Last data filed at 08/01/2020 1819 Gross per 24 hour  Intake 1504.82 ml  Output 300 ml  Net 1204.82 ml   Filed Weights   07/29/20 0600 07/29/20 1445 07/31/20 0600  Weight: 63.5 kg 56.2 kg 63.5 kg    Data Reviewed: I have personally reviewed and interpreted daily labs, tele strips, imaging. I reviewed all nursing notes, pharmacy notes, vitals, pertinent old records I have discussed plan of care as described above with RN and patient/family.  CBC: Recent Labs  Lab 07/28/20 1809 07/29/20 0917 07/31/20 0336  WBC 20.0*  --  7.9  HGB 14.5 13.3 11.4*  HCT 43.3 39.0 33.9*  MCV 93.1  --  90.4  PLT 282  --  536   Basic Metabolic Panel: Recent Labs  Lab 07/29/20 1608 07/29/20 2337 07/30/20 0326 07/30/20 0733 07/30/20 1017 07/30/20 1632 07/30/20 2010 07/31/20 0336 07/31/20 0744 07/31/20 1523 08/01/20 0632  NA 135 132* 137 136  --   --   --   --   --   --  140  K 3.3* 4.0 4.0 4.1  --   --   --   --   --   --  4.4  CL 100 104 108 107  --   --   --   --   --   --  109  CO2 21* 16* 21* 18*  --   --   --   --   --   --  22  GLUCOSE 254* 174* 160* 209*  --   --   --   --   --   --  135*  BUN 32* 28* 23* 22*  --   --   --   --   --   --  22*  CREATININE 1.07* 0.72 0.65 0.51  --   --   --   --   --   --  0.49  CALCIUM 9.2 8.3* 8.7* 8.4*  --   --   --   --   --   --  8.1*  MG  --   --   --   --    < > 2.2 1.9 2.0 1.9 2.0  --    < > = values in this interval not displayed.    Studies: No results found.  Scheduled Meds: . Chlorhexidine Gluconate Cloth  6 each Topical Daily  . DULoxetine  60 mg Oral BID  . enoxaparin (LOVENOX) injection  40 mg Subcutaneous Q24H  . feeding supplement  237 mL Oral BID BM  . gabapentin  400 mg Oral BID  . insulin aspart  0-15 Units Subcutaneous TID WC  . insulin aspart  0-5 Units Subcutaneous QHS  . insulin aspart  4 Units Subcutaneous TID WC  . insulin  detemir  15 Units Subcutaneous Q12H  . pantoprazole  40 mg Oral Daily  . predniSONE  50 mg Oral Q breakfast  . saccharomyces boulardii  250 mg Oral BID   Continuous Infusions: .  azithromycin Stopped (08/01/20 0031)  . cefTRIAXone (ROCEPHIN)  IV Stopped (08/01/20 1548)  . lactated ringers 50 mL/hr at 08/01/20 1100   PRN Meds: acetaminophen, albuterol, alum & mag hydroxide-simeth, dextrose, metoprolol tartrate, ondansetron (ZOFRAN) IV, promethazine  Time spent: 35 minutes  Author: Berle Mull, MD [facility] 08/01/2020 6:24 PM  To reach On-call, see care teams to locate the attending and reach out via [email or web address]. Between 7PM-7AM, please contact night-coverage If you still have difficulty reaching the attending provider, please page the [facility] (Director on Call) for [facility] on amion for assistance.

## 2020-08-02 LAB — COMPREHENSIVE METABOLIC PANEL
ALT: 72 U/L — ABNORMAL HIGH (ref 0–44)
AST: 71 U/L — ABNORMAL HIGH (ref 15–41)
Albumin: 2 g/dL — ABNORMAL LOW (ref 3.5–5.0)
Alkaline Phosphatase: 174 U/L — ABNORMAL HIGH (ref 38–126)
Anion gap: 8 (ref 5–15)
BUN: 17 mg/dL (ref 6–20)
CO2: 25 mmol/L (ref 22–32)
Calcium: 8.3 mg/dL — ABNORMAL LOW (ref 8.9–10.3)
Chloride: 106 mmol/L (ref 98–111)
Creatinine, Ser: 0.56 mg/dL (ref 0.44–1.00)
GFR, Estimated: >60 mL/min (ref >60)
Glucose, Bld: 80 mg/dL (ref 70–99)
Potassium: 4 mmol/L (ref 3.5–5.1)
Sodium: 139 mmol/L (ref 135–145)
Total Bilirubin: 0.8 mg/dL (ref 0.3–1.2)
Total Protein: 5.4 g/dL — ABNORMAL LOW (ref 6.5–8.1)

## 2020-08-02 LAB — CBC WITH DIFFERENTIAL/PLATELET
Abs Immature Granulocytes: 0.25 10*3/uL — ABNORMAL HIGH (ref 0.00–0.07)
Basophils Absolute: 0.1 10*3/uL (ref 0.0–0.1)
Basophils Relative: 1 %
Eosinophils Absolute: 0 10*3/uL (ref 0.0–0.5)
Eosinophils Relative: 0 %
HCT: 32.8 % — ABNORMAL LOW (ref 36.0–46.0)
Hemoglobin: 11.6 g/dL — ABNORMAL LOW (ref 12.0–15.0)
Immature Granulocytes: 2 %
Lymphocytes Relative: 10 %
Lymphs Abs: 1.3 10*3/uL (ref 0.7–4.0)
MCH: 33.1 pg (ref 26.0–34.0)
MCHC: 35.4 g/dL (ref 30.0–36.0)
MCV: 93.7 fL (ref 80.0–100.0)
Monocytes Absolute: 1.2 10*3/uL — ABNORMAL HIGH (ref 0.1–1.0)
Monocytes Relative: 10 %
Neutro Abs: 9.6 10*3/uL — ABNORMAL HIGH (ref 1.7–7.7)
Neutrophils Relative %: 77 %
Platelets: 201 10*3/uL (ref 150–400)
RBC: 3.5 MIL/uL — ABNORMAL LOW (ref 3.87–5.11)
RDW: 13.4 % (ref 11.5–15.5)
WBC: 12.5 10*3/uL — ABNORMAL HIGH (ref 4.0–10.5)
nRBC: 0 % (ref 0.0–0.2)

## 2020-08-02 LAB — GLUCOSE, CAPILLARY
Glucose-Capillary: 66 mg/dL — ABNORMAL LOW (ref 70–99)
Glucose-Capillary: 77 mg/dL (ref 70–99)
Glucose-Capillary: 138 mg/dL — ABNORMAL HIGH (ref 70–99)
Glucose-Capillary: 312 mg/dL — ABNORMAL HIGH (ref 70–99)
Glucose-Capillary: 350 mg/dL — ABNORMAL HIGH (ref 70–99)

## 2020-08-02 LAB — C-REACTIVE PROTEIN: CRP: 5.9 mg/dL — ABNORMAL HIGH (ref <1.0)

## 2020-08-02 LAB — PROCALCITONIN: Procalcitonin: 0.79 ng/mL

## 2020-08-02 MED ORDER — METHOCARBAMOL 500 MG PO TABS
500.0000 mg | ORAL_TABLET | Freq: Three times a day (TID) | ORAL | Status: DC | PRN
  Administered 2020-08-02 – 2020-08-03 (×2): 500 mg via ORAL
  Filled 2020-08-02 (×2): qty 1

## 2020-08-02 MED ORDER — GABAPENTIN 300 MG PO CAPS
600.0000 mg | ORAL_CAPSULE | Freq: Three times a day (TID) | ORAL | Status: DC
Start: 2020-08-02 — End: 2020-08-13
  Administered 2020-08-02 – 2020-08-12 (×33): 600 mg via ORAL
  Filled 2020-08-02 (×33): qty 2

## 2020-08-02 MED ORDER — INSULIN ASPART 100 UNIT/ML ~~LOC~~ SOLN
4.0000 [IU] | Freq: Three times a day (TID) | SUBCUTANEOUS | Status: DC
  Administered 2020-08-02 – 2020-08-08 (×17): 4 [IU] via SUBCUTANEOUS

## 2020-08-02 MED ORDER — KETOROLAC TROMETHAMINE 15 MG/ML IJ SOLN
15.0000 mg | Freq: Four times a day (QID) | INTRAMUSCULAR | Status: AC | PRN
  Administered 2020-08-02 – 2020-08-03 (×2): 15 mg via INTRAVENOUS
  Filled 2020-08-02 (×2): qty 1

## 2020-08-02 MED ORDER — ACETAMINOPHEN 325 MG PO TABS
650.0000 mg | ORAL_TABLET | Freq: Four times a day (QID) | ORAL | Status: DC | PRN
  Administered 2020-08-04 – 2020-08-10 (×3): 650 mg via ORAL
  Filled 2020-08-02 (×3): qty 2

## 2020-08-02 MED ORDER — INSULIN DETEMIR 100 UNIT/ML ~~LOC~~ SOLN
20.0000 [IU] | Freq: Every day | SUBCUTANEOUS | Status: DC
  Administered 2020-08-02 – 2020-08-05 (×4): 20 [IU] via SUBCUTANEOUS
  Filled 2020-08-02 (×5): qty 0.2

## 2020-08-02 NOTE — Evaluation (Signed)
Physical Therapy Evaluation Patient Details Name: [name] [name] MRN: [id number] DOB: May 27, 1961 Today's Date: 08/02/2020   History of Present Illness  Pt is a 60 y.o. female admitted 07/28/20 with SOB, fatigue and poor p.o. intake; workup for DKA, acute hypoxic respiratory failure due to COVID-19 PNA. PMH includes DM2, HTN, chronic pain syndrome, multiple falls (fall s/p R ankle ORIF 02/25/20).    Clinical Impression  Pt presents with an overall decrease in functional mobility secondary to above. PTA, pt mod indep with multiple recent falls, lives with multiple family members who are not able to assist if needed. Initiated education re: current condition, activity recommendations, energy conservation, and importance of mobility. Today, pt able to initiate transfer/gait training with RW and seated ADL tasks; requiring up to [facility] for mobility and dependent for posterior pericare due to bowel incontinence; pt at high risk for falls. Pt would benefit from continued acute PT services to maximize functional mobility and independence prior to d/c with SNF-level therapies; pt in agreement.    Follow Up Recommendations SNF;Supervision for mobility/OOB    Equipment Recommendations  None recommended by PT    Recommendations for Other Services       Precautions / Restrictions Precautions Precautions: Fall;Other (comment) Precaution Comments: Bowel incontinence Restrictions Weight Bearing Restrictions: No      Mobility  Bed Mobility Overal bed mobility: Needs Assistance Bed Mobility: Supine to Sit     Supine to sit: Supervision;HOB elevated          Transfers Overall transfer level: Needs assistance Equipment used: Rolling walker (2 wheeled) Transfers: Sit to/from Stand Sit to Stand: Mod assist;Min assist;+2 safety/equipment         General transfer comment: Initial modA to assist trunk elevation standing from EOB to RW, cues for hand placement; bowel incontinence upon standing;  additional trial from [facility] with minA for stability  Ambulation/Gait Ambulation/Gait assistance: Min assist;+2 safety/equipment Gait Distance (Feet): 14 Feet Assistive device: Rolling walker (2 wheeled) Gait Pattern/deviations: Step-through pattern;Decreased stride length;Trunk flexed Gait velocity: Decreased   General Gait Details: Slow, labored gait with RW and intermittent minA for stability; further distance limited by fatigue and pain  Stairs            Wheelchair Mobility    Modified Rankin (Stroke Patients Only)       Balance Overall balance assessment: Needs assistance   Sitting balance-Leahy Scale: Fair       Standing balance-Leahy Scale: Poor Standing balance comment: Reliant on UE support; dependent for posterior pericare                             Pertinent Vitals/Pain Pain Assessment: Faces Faces Pain Scale: Hurts little more Pain Location: "Everywhere... because of COVID" Pain Descriptors / Indicators: Discomfort Pain Intervention(s): Monitored during session;Patient requesting pain meds-RN notified    Home Living Family/patient expects to be discharged to:: Private residence Living Arrangements: Children   Type of Home: House Home Access: Stairs to enter Entrance Stairs-Rails: None Entrance Stairs-Number of Steps: 3 Home Layout: One level Home Equipment: Toilet riser;Wheelchair - [employer] - 2 wheels;Shower seat;Bedside commode Additional Comments: Lives with son's family (son, daughter-in-law, grandkids (11, 40, 41 y.o.), DIL's mom; crowded with multiple family members and multiple pets. HH stopped working with patient due to condition of household    Prior Function Level of Independence: Independent         Comments: Reports indepenedent without DME; able to perform some  household tasks. Typically "furniture surfs" for stability; endorses falls with recent visit to ED, but does not recall details.     Hand Dominance         Extremity/Trunk Assessment   Upper Extremity Assessment Upper Extremity Assessment: Generalized weakness    Lower Extremity Assessment Lower Extremity Assessment: Generalized weakness       Communication   Communication: No difficulties  Cognition Arousal/Alertness: Awake/alert Behavior During Therapy: Flat affect Overall Cognitive Status: No family/caregiver present to determine baseline cognitive functioning                                 General Comments: Generally flat affect and soft spoken (reports sore throat); answering questions and following commands well. Question some slowed processing, could be more fatigue-related      General Comments      Exercises     Assessment/Plan    PT Assessment Patient needs continued PT services  PT Problem List Decreased strength;Decreased activity tolerance;Decreased balance;Decreased mobility;Decreased knowledge of precautions       PT Treatment Interventions DME instruction;Gait training;Stair training;Functional mobility training;Therapeutic activities;Therapeutic exercise;Balance training;Patient/family education    PT Goals (Current goals can be found in the Care Plan section)  Acute Rehab PT Goals Patient Stated Goal: Hopeful for post-acute rehab to regain strength before return home PT Goal Formulation: With patient Time For Goal Achievement: 08/16/20 Potential to Achieve Goals: Good    Frequency Min 2X/week   Barriers to discharge Decreased caregiver support;Inaccessible home environment      Co-evaluation PT/OT/SLP Co-Evaluation/Treatment: Yes Reason for Co-Treatment: For patient/therapist safety;To address functional/ADL transfers           AM-PAC PT "6 Clicks" Mobility  Outcome Measure Help needed turning from your back to your side while in a flat bed without using bedrails?: A Little Help needed moving from lying on your back to sitting on the side of a flat bed without using  bedrails?: A Little Help needed moving to and from a bed to a chair (including a wheelchair)?: A Lot Help needed standing up from a chair using your arms (e.g., wheelchair or bedside chair)?: A Lot Help needed to walk in hospital room?: A Little Help needed climbing 3-5 steps with a railing? : A Lot 6 Click Score: 15    End of Session Equipment Utilized During Treatment: Gait belt Activity Tolerance: Patient limited by fatigue Patient left: in chair;with call bell/phone within reach;with chair alarm set Nurse Communication: Mobility status PT Visit Diagnosis: Other abnormalities of gait and mobility (R26.89);Muscle weakness (generalized) (M62.81)    Time: 9381-8299 PT Time Calculation (min) (ACUTE ONLY): 41 min   Charges:   PT Evaluation $PT Eval Moderate Complexity: 1 Mod     Mabeline Caras, PT, DPT Acute Rehabilitation Services  Pager [phone] Office [place] 08/02/2020, 11:19 AM

## 2020-08-02 NOTE — Progress Notes (Addendum)
CSW notes PT recommendation for SNF. Unfortunately patient is uninsured and there are no COVID SNF beds available at this time. Hopeful for patient improvement, otherwise will be on Difficult to Place list. Patient received a wheelchair and 3in1 upon a recent admission.   Chyrel Taha LCSW

## 2020-08-02 NOTE — Evaluation (Signed)
Occupational Therapy Evaluation Patient Details Name: [name] [name] MRN: [id number] DOB: [birth year] Today's Date: 08/02/2020    History of Present Illness Pt is a 60 y.o. female admitted 07/28/20 with SOB, fatigue and poor p.o. intake; workup for DKA, acute hypoxic respiratory failure due to COVID-19 PNA. PMH includes DM2, HTN, chronic pain syndrome, multiple falls (fall s/p R ankle ORIF 02/25/20).   Clinical Impression   Pt is typically independent in ADL and transfers, although does "furniture surf". She does not participate in IADL and relies on her family. Today she requires extra time for all aspects of bed mobility, ADL, and in room mobility with RW. She is overall mod A for transfers (power up) and max A for rear peri care. Max A for LB ADL at this time. She will benefit from skilled OT in the acute setting as well as SNF post-acute to maximize safety and independence in ADL and functional transfers and for overall strength and function.    Follow Up Recommendations  SNF    Equipment Recommendations  None recommended by OT (Pt has appropriate DME)    Recommendations for Other Services       Precautions / Restrictions Precautions Precautions: Fall;Other (comment) Precaution Comments: Bowel incontinence Restrictions Weight Bearing Restrictions: No      Mobility Bed Mobility Overal bed mobility: Needs Assistance Bed Mobility: Supine to Sit     Supine to sit: Supervision;HOB elevated     General bed mobility comments: increased time required, use of bed rail    Transfers Overall transfer level: Needs assistance Equipment used: Rolling walker (2 wheeled) Transfers: Sit to/from Stand Sit to Stand: Mod assist;Min assist;+2 safety/equipment         General transfer comment: Initial modA to assist trunk elevation standing from EOB to RW, cues for hand placement; bowel incontinence upon standing; additional trial from [facility] with minA for stability    Balance Overall  balance assessment: Needs assistance Sitting-balance support: Feet supported;Single extremity supported Sitting balance-Leahy Scale: Fair Sitting balance - Comments: unchallenged   Standing balance support: Bilateral upper extremity supported Standing balance-Leahy Scale: Poor Standing balance comment: Reliant on UE support; dependent for posterior pericare                           ADL either performed or assessed with clinical judgement   ADL Overall ADL's : Needs assistance/impaired Eating/Feeding: Modified independent;Sitting   Grooming: Set up;Sitting Grooming Details (indicate cue type and reason): unable to perform in standing Upper Body Bathing: Moderate assistance   Lower Body Bathing: Moderate assistance   Upper Body Dressing : Minimal assistance   Lower Body Dressing: Maximal assistance;Bed level Lower Body Dressing Details (indicate cue type and reason): unable to don socks at this time Toilet Transfer: Moderate assistance;Minimal assistance;Ambulation;RW Toilet Transfer Details (indicate cue type and reason): increased time, mod A for initial boost, min guard A for ambulation (short) Toileting- Clothing Manipulation and Hygiene: Moderate assistance;+2 for safety/equipment;+2 for physical assistance;Sit to/from stand Toileting - Clothing Manipulation Details (indicate cue type and reason): mod A for boost and hold in standing,2nd person performing rear peri care     Functional mobility during ADLs: Minimal assistance;Min guard;+2 for safety/equipment;Rolling walker (increased time required, fatigues quickly) General ADL Comments: fatigues quickly, former CNA     Vision         Perception     Praxis      Pertinent Vitals/Pain Pain Assessment: Faces Faces Pain Scale: Hurts  little more Pain Location: "Everywhere... because of COVID" Pain Descriptors / Indicators: Discomfort;Aching Pain Intervention(s): Monitored during session;Repositioned      Hand Dominance Right   Extremity/Trunk Assessment Upper Extremity Assessment Upper Extremity Assessment: Generalized weakness   Lower Extremity Assessment Lower Extremity Assessment: Generalized weakness       Communication Communication Communication: No difficulties   Cognition Arousal/Alertness: Awake/alert Behavior During Therapy: Flat affect Overall Cognitive Status: No family/caregiver present to determine baseline cognitive functioning                                 General Comments: Generally flat affect and soft spoken (reports sore throat); answering questions and following commands well. Question some slowed processing, could be more fatigue-related   General Comments       Exercises     Shoulder Instructions      Home Living Family/patient expects to be discharged to:: Private residence Living Arrangements: Children Available Help at Discharge: Family;Available PRN/intermittently Type of Home: House Home Access: Stairs to enter [employer] of Steps: 3 Entrance Stairs-Rails: None Home Layout: One level     Bathroom Shower/Tub: Teacher, early years/pre: Handicapped height Bathroom Accessibility: No   Home Equipment: Toilet riser;Wheelchair - [employer] - 2 wheels;Shower seat;Bedside commode   Additional Comments: Lives with son's family (son, daughter-in-law, grandkids (52, 28, 68 y.o.), DIL's mom; crowded with multiple family members and multiple pets. HH stopped working with patient due to condition of household      Prior Functioning/Environment Level of Independence: Independent        Comments: Reports independent without DME; able to perform some household tasks. Typically "furniture surfs" for stability; endorses falls with recent visit to ED, but does not recall details.        OT Problem List: Decreased strength;Decreased activity tolerance;Decreased range of motion;Impaired balance (sitting and/or  standing);Decreased cognition;Decreased safety awareness;Decreased knowledge of use of DME or AE;Decreased knowledge of precautions;Cardiopulmonary status limiting activity      OT Treatment/Interventions: Self-care/ADL training;Therapeutic exercise;Energy conservation;DME and/or AE instruction;Therapeutic activities;Patient/family education;Balance training    OT Goals(Current goals can be found in the care plan section) Acute Rehab OT Goals Patient Stated Goal: Hopeful for post-acute rehab to regain strength before return home OT Goal Formulation: With patient Time For Goal Achievement: 08/16/20 Potential to Achieve Goals: Good ADL Goals Pt Will Perform Grooming: with min guard assist;standing Pt Will Perform Upper Body Dressing: with set-up;sitting Pt Will Perform Lower Body Dressing: with set-up;sit to/from stand Pt Will Transfer to Toilet: with min guard assist;ambulating Pt Will Perform Toileting - Clothing Manipulation and hygiene: with set-up;sit to/from stand Additional ADL Goal #1: pt will verbalize 3 strategies for energy conservation during ADL with no cues Additional ADL Goal #2: Pt will perform HEP at independent level per handout to strengthen body in preparation for ADL  OT Frequency: Min 3X/week   Barriers to D/C: Decreased caregiver support  unable to get HH therapies in the past due to living conditions       Co-evaluation PT/OT/SLP Co-Evaluation/Treatment: Yes Reason for Co-Treatment: For patient/therapist safety;To address functional/ADL transfers PT goals addressed during session: Mobility/safety with mobility;Proper use of DME OT goals addressed during session: ADL's and self-care;Strengthening/ROM      AM-PAC OT "6 Clicks" Daily Activity     Outcome Measure Help from another person eating meals?: None Help from another person taking care of personal grooming?: A Lot Help from another  person toileting, which includes using toliet, bedpan, or urinal?: A  Lot Help from another person bathing (including washing, rinsing, drying)?: A Lot Help from another person to put on and taking off regular upper body clothing?: A Little Help from another person to put on and taking off regular lower body clothing?: A Lot 6 Click Score: 15   End of Session Equipment Utilized During Treatment: Gait belt;Rolling walker Nurse Communication: Mobility status  Activity Tolerance: Patient tolerated treatment well Patient left: in chair;with call bell/phone within reach;with chair alarm set  OT Visit Diagnosis: Unsteadiness on feet (R26.81);Other abnormalities of gait and mobility (R26.89);Muscle weakness (generalized) (M62.81);History of falling (Z91.81);Other symptoms and signs involving cognitive function                Time: 8315-1761 OT Time Calculation (min): 41 min Charges:  OT General Charges $OT Visit: 1 Visit OT Evaluation $OT Eval Moderate Complexity: 1 Mod OT Treatments $Self Care/Home Management : 8-22 mins  Jesse Sans OTR/L Acute Rehabilitation Services Pager: [phone] Office: [place] 08/02/2020, 3:03 PM

## 2020-08-02 NOTE — Progress Notes (Signed)
[facility] Progress Note  Patient: [name] [name]    JQB:[id number]  DOA: 07/28/2020     Date of Service: the patient was seen and examined on 08/02/2020  Brief hospital course: Past medical history of type II DM, HTN and chronic pain syndrome.  Presents with complaints of shortness of breath and fatigue with poor p.o. intake found to have COVID-19 pneumonia as well as DKA.  Admitted to the ICU. Transfer to [facility] on 1/13 Currently plan is improvement in oral intake  Assessment and Plan: 1. Acute Hypoxic respiratory failure, POA, 86 % on room air on admission.  Acute COVID-19 Viral Pneumonia Community-acquired pneumonia Sepsis secondary to viral and community-acquired bacterial pneumonia, POA Meeting SIRS criteria on admission with tachycardia, tachypnea and fever. CXR: hazy bilateral peripheral opacities Oxygen requirement: Currently on room air.  At peak was on heated high flow nasal cannula plus nonrebreather CRP: 28.7-10.5 Remdesivir: Not initiated Steroids: Initially on Solu-Medrol currently on prednisone. Baricitinib/Actemra: Started on 1/11, will discontinue it on 1/13 given concern with bacterial infection Antibiotics: Started on IV azithromycin and ceftriaxone on admission continue to complete a 5-day treatment course. DVT Prophylaxis: enoxaparin (LOVENOX) injection 40 mg Start: 07/30/20 1230  Prone positioning and incentive spirometer use recommended.  The treatment plan and use of medications and known side effects were discussed with patient/family. It was clearly explained that complete risks and long-term side effects are unknown. Patient/family agree with the treatment plan.    2.  DKA Diabetes mellitus, uncontrolled Anion gap resolved with IV fluids and IV insulin. Currently on IV fluids with Also on sliding scale insulin as well as Levemir. Monitor.  3.  Intractable nausea Currently resolved.  Oral intake improving.  4.  Diarrhea Patient reports ongoing  diarrhea since she is sick. Today 2 episodes of diarrhea reported by the patient. Likely associated with antibiotics. Add Florastor.  5.  Chronic pain syndrome Continue home regimen. Patient is crying today that she is having pain all over her body. Unable to specify where. Increasing gabapentin to 3 times daily.  6.  Adrenal insufficiency Cortisol level significantly elevated with concern Florinef held at admission. Currently on prednisone.  Monitor.  Diet: Carb modified diet DVT Prophylaxis:   enoxaparin (LOVENOX) injection 40 mg Start: 07/30/20 1230    Advance goals of care discussion: Full code  Family Communication: no family was present at bedside, at the time of interview.   Disposition:  Status is: Inpatient  Remains inpatient appropriate because:IV treatments appropriate due to intensity of illness or inability to take PO   Dispo: The patient is from: Home              Anticipated d/c is to: Home              Anticipated d/c date is: 2 days              Patient currently is not medically stable to d/c.        Subjective: No further diarrhea.  Crying that she is having pain all over her body.  No nausea or vomiting.  Physical Exam:  General: Appear in mild distress, no Rash; Oral Mucosa Clear, moist. no Abnormal Neck Mass Or lumps, Conjunctiva normal  Cardiovascular: S1 and S2 Present, no Murmur, Respiratory: Normal respiratory effort, Bilateral Air entry present and no crackles, no wheezes Abdomen: Bowel Sound present, Soft and reports diffuse mild tenderness Extremities: trace Pedal edema Neurology: alert and oriented to time, place, and person affect appropriate. no  new focal deficit Gait not checked due to patient safety concerns  Vitals:   08/02/20 0808 08/02/20 1212 08/02/20 1700 08/02/20 1946  BP: (!) 141/88 115/67 123/64 124/67  Pulse: 91 84 89 100  Resp: 18 17 18 18   Temp: 98.1 F (36.7 C) 98 F (36.7 C) 99 F (37.2 C) 98.5 F (36.9 C)   TempSrc: Oral Oral Oral Oral  SpO2: 98% 97% 96% 92%  Weight:      Height:        Intake/Output Summary (Last 24 hours) at 08/02/2020 2020 Last data filed at 08/02/2020 0900 Gross per 24 hour  Intake 876.21 ml  Output --  Net 876.21 ml   Filed Weights   07/29/20 0600 07/29/20 1445 07/31/20 0600  Weight: 63.5 kg 56.2 kg 63.5 kg    Data Reviewed: I have personally reviewed and interpreted daily labs, tele strips, imaging. I reviewed all nursing notes, pharmacy notes, vitals, pertinent old records I have discussed plan of care as described above with RN and patient/family.  CBC: Recent Labs  Lab 07/28/20 1809 07/29/20 0917 07/31/20 0336 08/02/20 0809  WBC 20.0*  --  7.9 12.5*  NEUTROABS  --   --   --  9.6*  HGB 14.5 13.3 11.4* 11.6*  HCT 43.3 39.0 33.9* 32.8*  MCV 93.1  --  90.4 93.7  PLT 282  --  160 671   Basic Metabolic Panel: Recent Labs  Lab 07/29/20 2337 07/30/20 0326 07/30/20 0733 07/30/20 1017 07/30/20 1632 07/30/20 2010 07/31/20 0336 07/31/20 0744 07/31/20 1523 08/01/20 0632 08/02/20 0809  NA 132* 137 136  --   --   --   --   --   --  140 139  K 4.0 4.0 4.1  --   --   --   --   --   --  4.4 4.0  CL 104 108 107  --   --   --   --   --   --  109 106  CO2 16* 21* 18*  --   --   --   --   --   --  22 25  GLUCOSE 174* 160* 209*  --   --   --   --   --   --  135* 80  BUN 28* 23* 22*  --   --   --   --   --   --  22* 17  CREATININE 0.72 0.65 0.51  --   --   --   --   --   --  0.49 0.56  CALCIUM 8.3* 8.7* 8.4*  --   --   --   --   --   --  8.1* 8.3*  MG  --   --   --    < > 2.2 1.9 2.0 1.9 2.0  --   --    < > = values in this interval not displayed.    Studies: No results found.  Scheduled Meds:  Chlorhexidine Gluconate Cloth  6 each Topical Daily   dextromethorphan-guaiFENesin  1 tablet Oral BID   DULoxetine  60 mg Oral BID   enoxaparin (LOVENOX) injection  40 mg Subcutaneous Q24H   feeding supplement  237 mL Oral BID BM   gabapentin  600 mg  Oral TID   insulin aspart  0-15 Units Subcutaneous TID WC   insulin aspart  0-5 Units Subcutaneous QHS   insulin aspart  4 Units Subcutaneous TID WC  insulin detemir  20 Units Subcutaneous QHS   pantoprazole  40 mg Oral Daily   predniSONE  50 mg Oral Q breakfast   saccharomyces boulardii  250 mg Oral BID   Continuous Infusions:  azithromycin Stopped (08/02/20 0318)   PRN Meds: acetaminophen, albuterol, alum & mag hydroxide-simeth, dextrose, ketorolac, methocarbamol, metoprolol tartrate, ondansetron (ZOFRAN) IV, promethazine  Time spent: 35 minutes  Author: Berle Mull, MD [facility] 08/02/2020 8:20 PM  To reach On-call, see care teams to locate the attending and reach out via [email or web address]. Between 7PM-7AM, please contact night-coverage If you still have difficulty reaching the attending provider, please page the [facility] (Director on Call) for [facility] on amion for assistance.

## 2020-08-03 ENCOUNTER — Inpatient Hospital Stay (HOSPITAL_COMMUNITY): Payer: HRSA Program

## 2020-08-03 LAB — GLUCOSE, CAPILLARY
Glucose-Capillary: 111 mg/dL — ABNORMAL HIGH (ref 70–99)
Glucose-Capillary: 110 mg/dL — ABNORMAL HIGH (ref 70–99)
Glucose-Capillary: 170 mg/dL — ABNORMAL HIGH (ref 70–99)
Glucose-Capillary: 249 mg/dL — ABNORMAL HIGH (ref 70–99)

## 2020-08-03 LAB — CULTURE, BLOOD (ROUTINE X 2)
Culture: NO GROWTH
Culture: NO GROWTH
Special Requests: ADEQUATE

## 2020-08-03 LAB — PROCALCITONIN: Procalcitonin: 0.67 ng/mL

## 2020-08-03 MED ORDER — LOPERAMIDE HCL 2 MG PO CAPS
2.0000 mg | ORAL_CAPSULE | Freq: Once | ORAL | Status: AC | PRN
  Administered 2020-08-03: 2 mg via ORAL
  Filled 2020-08-03: qty 1

## 2020-08-03 NOTE — Progress Notes (Signed)
[facility] Progress Note  Patient: [name] [name]    WUJ:[id number]  DOA: 07/28/2020     Date of Service: the patient was seen and examined on 08/03/2020  Brief hospital course: Past medical history of type II DM, HTN and chronic pain syndrome.  Presents with complaints of shortness of breath and fatigue with poor p.o. intake found to have COVID-19 pneumonia as well as DKA.  Admitted to the ICU. Transfer to [facility] on 1/13 Currently plan is arrange for safe discharge  Assessment and Plan: 1. Acute Hypoxic respiratory failure, POA, 86 % on room air on admission.  Acute COVID-19 Viral Pneumonia Community-acquired pneumonia Sepsis secondary to viral and community-acquired bacterial pneumonia, POA Meeting SIRS criteria on admission with tachycardia, tachypnea and fever. CXR: hazy bilateral peripheral opacities Oxygen requirement: Currently on room air.  At peak was on heated high flow nasal cannula plus nonrebreather CRP: 28.7-10.5-5.9 Remdesivir: Not initiated Steroids: Initially on Solu-Medrol currently on prednisone. Baricitinib/Actemra: Started on 1/11, stopped on 1/13 given concern with bacterial infection Antibiotics: Started on IV azithromycin and ceftriaxone on admission continue to complete a 5-day treatment course. DVT Prophylaxis: enoxaparin (LOVENOX) injection 40 mg Start: 07/30/20 1230  Prone positioning and incentive spirometer use recommended.  The treatment plan and use of medications and known side effects were discussed with patient/family. It was clearly explained that complete risks and long-term side effects are unknown. Patient/family agree with the treatment plan.    2.  DKA Diabetes mellitus, uncontrolled Anion gap resolved with IV fluids and IV insulin. Currently on IV fluids with Also on sliding scale insulin as well as Levemir. Monitor.  3.  Intractable nausea Currently resolved.  Oral intake improving.  4.  Diarrhea Patient reports ongoing diarrhea  since she is sick. Today 2 episodes of diarrhea reported by the patient. Likely associated with antibiotics. Add Florastor. Check x r abdomen.   5.  Chronic pain syndrome Continue home regimen. Patient is crying today that she is having pain all over her body. Unable to specify where. Increasing gabapentin to 3 times daily. Pain better. Mood better.   6.  Adrenal insufficiency Cortisol level significantly elevated with concern Florinef held at admission. Currently on prednisone.  Monitor.  Diet: Carb modified diet DVT Prophylaxis:   enoxaparin (LOVENOX) injection 40 mg Start: 07/30/20 1230    Advance goals of care discussion: Full code  Family Communication: no family was present at bedside, at the time of interview. D/w daughter  Disposition:  Status is: Inpatient  Remains inpatient appropriate because:IV treatments appropriate due to intensity of illness or inability to take PO   Dispo: The patient is from: Home              Anticipated d/c is to: Home              Anticipated d/c date is: 2 days              Patient currently is not medically stable to d/c.  Subjective: No acute complaint.  Reportedly 4-5 bowel movements overnight.  No blood in the stool.  No abdominal pain.  Oral intake improving.  No vomiting.  Physical Exam:  General: Appear in mild distress, no Rash; Oral Mucosa Clear, moist. no Abnormal Neck Mass Or lumps, Conjunctiva normal  Cardiovascular: S1 and S2 Present, no Murmur, Respiratory: Normal respiratory effort, Bilateral Air entry present and bilateral  Crackles, no wheezes Abdomen: Bowel Sound present, Soft and no tenderness Extremities: trace Pedal edema Neurology: alert and  oriented to time, place, and person affect appropriate. no new focal deficit Gait not checked due to patient safety concerns    Vitals:   08/02/20 1700 08/02/20 1946 08/03/20 0357 08/03/20 0753  BP: 123/64 124/67 139/79 118/60  Pulse: 89 100 (!) 108 100  Resp: 18 18  18 20   Temp: 99 F (37.2 C) 98.5 F (36.9 C) 98.8 F (37.1 C) 98.7 F (37.1 C)  TempSrc: Oral Oral Oral Oral  SpO2: 96% 92% 91% 90%  Weight:      Height:        Intake/Output Summary (Last 24 hours) at 08/03/2020 1523 Last data filed at 08/03/2020 1321 Gross per 24 hour  Intake 610 ml  Output --  Net 610 ml   Filed Weights   07/29/20 0600 07/29/20 1445 07/31/20 0600  Weight: 63.5 kg 56.2 kg 63.5 kg    Data Reviewed: I have personally reviewed and interpreted daily labs, tele strips, imaging. I reviewed all nursing notes, pharmacy notes, vitals, pertinent old records I have discussed plan of care as described above with RN and patient/family.  CBC: Recent Labs  Lab 07/28/20 1809 07/29/20 0917 07/31/20 0336 08/02/20 0809  WBC 20.0*  --  7.9 12.5*  NEUTROABS  --   --   --  9.6*  HGB 14.5 13.3 11.4* 11.6*  HCT 43.3 39.0 33.9* 32.8*  MCV 93.1  --  90.4 93.7  PLT 282  --  160 673   Basic Metabolic Panel: Recent Labs  Lab 07/29/20 2337 07/30/20 0326 07/30/20 0733 07/30/20 1017 07/30/20 1632 07/30/20 2010 07/31/20 0336 07/31/20 0744 07/31/20 1523 08/01/20 0632 08/02/20 0809  NA 132* 137 136  --   --   --   --   --   --  140 139  K 4.0 4.0 4.1  --   --   --   --   --   --  4.4 4.0  CL 104 108 107  --   --   --   --   --   --  109 106  CO2 16* 21* 18*  --   --   --   --   --   --  22 25  GLUCOSE 174* 160* 209*  --   --   --   --   --   --  135* 80  BUN 28* 23* 22*  --   --   --   --   --   --  22* 17  CREATININE 0.72 0.65 0.51  --   --   --   --   --   --  0.49 0.56  CALCIUM 8.3* 8.7* 8.4*  --   --   --   --   --   --  8.1* 8.3*  MG  --   --   --    < > 2.2 1.9 2.0 1.9 2.0  --   --    < > = values in this interval not displayed.    Studies: No results found.  Scheduled Meds: . Chlorhexidine Gluconate Cloth  6 each Topical Daily  . dextromethorphan-guaiFENesin  1 tablet Oral BID  . DULoxetine  60 mg Oral BID  . enoxaparin (LOVENOX) injection  40 mg  Subcutaneous Q24H  . feeding supplement  237 mL Oral BID BM  . gabapentin  600 mg Oral TID  . insulin aspart  0-15 Units Subcutaneous TID WC  . insulin aspart  0-5 Units Subcutaneous  QHS  . insulin aspart  4 Units Subcutaneous TID WC  . insulin detemir  20 Units Subcutaneous QHS  . pantoprazole  40 mg Oral Daily  . predniSONE  50 mg Oral Q breakfast  . saccharomyces boulardii  250 mg Oral BID   Continuous Infusions:  PRN Meds: acetaminophen, albuterol, alum & mag hydroxide-simeth, dextrose, ketorolac, loperamide, methocarbamol, metoprolol tartrate, ondansetron (ZOFRAN) IV, promethazine  Time spent: 35 minutes  Author: Berle Mull, MD [facility] 08/03/2020 3:23 PM  To reach On-call, see care teams to locate the attending and reach out via [email or web address]. Between 7PM-7AM, please contact night-coverage If you still have difficulty reaching the attending provider, please page the [facility] (Director on Call) for [facility] on amion for assistance.

## 2020-08-04 LAB — GLUCOSE, CAPILLARY
Glucose-Capillary: 124 mg/dL — ABNORMAL HIGH (ref 70–99)
Glucose-Capillary: 202 mg/dL — ABNORMAL HIGH (ref 70–99)
Glucose-Capillary: 370 mg/dL — ABNORMAL HIGH (ref 70–99)
Glucose-Capillary: 273 mg/dL — ABNORMAL HIGH (ref 70–99)

## 2020-08-04 NOTE — Progress Notes (Signed)
[facility] Progress Note  Patient: [name] [name]    [id number]  DOA: 07/28/2020     Date of Service: the patient was seen and examined on 08/04/2020  Brief hospital course: Past medical history of type II DM, HTN and chronic pain syndrome.  Presents with complaints of shortness of breath and fatigue with poor p.o. intake found to have COVID-19 pneumonia as well as DKA.  Admitted to the ICU. Transfer to [facility] on 1/13. Currently plan is arrange for safe discharge.  Assessment and Plan: 1. Acute Hypoxic respiratory failure, POA, 86 % on room air on admission.  Acute COVID-19 Viral Pneumonia Community-acquired pneumonia Sepsis secondary to viral and community-acquired bacterial pneumonia, POA Meeting SIRS criteria on admission with tachycardia, tachypnea and fever. CXR: hazy bilateral peripheral opacities Oxygen requirement: Currently on room air.  At peak was on heated high flow nasal cannula plus nonrebreather CRP: 28.7-10.5-5.9 Remdesivir: Not initiated Steroids: Initially on Solu-Medrol currently on prednisone. Initiate taper to better Baricitinib/Actemra: Started on 1/11, stopped on 1/13 given concern with bacterial infection Antibiotics: Started on IV azithromycin and ceftriaxone on admission continue to complete a 5-day treatment course. DVT Prophylaxis: enoxaparin (LOVENOX) injection 40 mg Start: 07/30/20 1230  Prone positioning and incentive spirometer use recommended.  The treatment plan and use of medications and known side effects were discussed with patient/family. It was clearly explained that complete risks and long-term side effects are unknown. Patient/family agree with the treatment plan.    2.  DKA Diabetes mellitus, uncontrolled Anion gap resolved with IV fluids and IV insulin. Currently on IV fluids with Also on sliding scale insulin as well as Levemir. Monitor.  3.  Intractable nausea Currently resolved.  Oral intake improving.  4.  Diarrhea Patient  reports ongoing diarrhea since she is sick.  Likely associated with antibiotics. Add Florastor. X-ray abdomen is unremarkable. Patient does not have abdominal pain and does not have any diarrhea anymore.  5.  Chronic pain syndrome Continue home regimen. Patient is crying today that she is having pain all over her body. Unable to specify where. Increasing gabapentin to 3 times daily. Pain better. Mood better.   6.  Adrenal insufficiency Cortisol level significantly elevated with concern Florinef held at admission. Currently on prednisone.  Monitor.  Diet: Carb modified diet DVT Prophylaxis:   enoxaparin (LOVENOX) injection 40 mg Start: 07/30/20 1230    Advance goals of care discussion: Full code  Family Communication: no family was present at bedside, at the time of interview.   Disposition:  Status is: Inpatient  Remains inpatient appropriate because:IV treatments appropriate due to intensity of illness or inability to take PO   Dispo: The patient is from: Home              Anticipated d/c is to: Home              Anticipated d/c date is: 2 days              Patient currently is not medically stable to d/c.  Subjective: Again reports generalized body ache. No nausea no vomiting no fever no chills. Oral intake adequate.  Physical Exam:  General: Appear in mild distress, no Rash; Oral Mucosa Clear, moist. no Abnormal Neck Mass Or lumps, Conjunctiva normal  Cardiovascular: S1 and S2 Present, no Murmur, Respiratory: normal respiratory effort, Bilateral Air entry present and faint Crackles, no wheezes Abdomen: Bowel Sound present, Soft and no tenderness Extremities: trace Pedal edema Neurology: alert and oriented to time, place,  and person affect appropriate. no new focal deficit Gait not checked due to patient safety concerns    Vitals:   08/03/20 1629 08/03/20 2104 08/04/20 0519 08/04/20 0815  BP: 120/67 112/63 (!) 106/57   Pulse: 95 75 82   Resp: 17 18 18    Temp:  98.8 F (37.1 C) 97.8 F (36.6 C) 98.2 F (36.8 C)   TempSrc: Oral Oral Oral   SpO2: 91% 92% 100% 98%  Weight:      Height:        Intake/Output Summary (Last 24 hours) at 08/04/2020 1407 Last data filed at 08/04/2020 7782 Gross per 24 hour  Intake 180 ml  Output -  Net 180 ml   Filed Weights   07/29/20 0600 07/29/20 1445 07/31/20 0600  Weight: 63.5 kg 56.2 kg 63.5 kg    Data Reviewed: I have personally reviewed and interpreted daily labs, tele strips, imaging. I reviewed all nursing notes, pharmacy notes, vitals, pertinent old records I have discussed plan of care as described above with RN and patient/family.  CBC: Recent Labs  Lab 07/28/20 1809 07/29/20 0917 07/31/20 0336 08/02/20 0809  WBC 20.0*  --  7.9 12.5*  NEUTROABS  --   --   --  9.6*  HGB 14.5 13.3 11.4* 11.6*  HCT 43.3 39.0 33.9* 32.8*  MCV 93.1  --  90.4 93.7  PLT 282  --  160 423   Basic Metabolic Panel: Recent Labs  Lab 07/29/20 2337 07/30/20 0326 07/30/20 0733 07/30/20 1017 07/30/20 1632 07/30/20 2010 07/31/20 0336 07/31/20 0744 07/31/20 1523 08/01/20 0632 08/02/20 0809  NA 132* 137 136  --   --   --   --   --   --  140 139  K 4.0 4.0 4.1  --   --   --   --   --   --  4.4 4.0  CL 104 108 107  --   --   --   --   --   --  109 106  CO2 16* 21* 18*  --   --   --   --   --   --  22 25  GLUCOSE 174* 160* 209*  --   --   --   --   --   --  135* 80  BUN 28* 23* 22*  --   --   --   --   --   --  22* 17  CREATININE 0.72 0.65 0.51  --   --   --   --   --   --  0.49 0.56  CALCIUM 8.3* 8.7* 8.4*  --   --   --   --   --   --  8.1* 8.3*  MG  --   --   --    < > 2.2 1.9 2.0 1.9 2.0  --   --    < > = values in this interval not displayed.    Studies: DG Abd Portable 1V  Result Date: 08/03/2020 CLINICAL DATA:  Abdominal distension. EXAM: PORTABLE ABDOMEN - 1 VIEW COMPARISON:  07/29/2020 FINDINGS: Bowel gas pattern is nonobstructive. No free peritoneal air. Minimal degenerate change of the spine and  hips. IMPRESSION: Nonobstructive bowel gas pattern. Electronically Signed   By: Marin Olp M.D.   On: 08/03/2020 16:01    Scheduled Meds: . dextromethorphan-guaiFENesin  1 tablet Oral BID  . DULoxetine  60 mg Oral BID  . enoxaparin (LOVENOX) injection  40 mg Subcutaneous Q24H  . feeding supplement  237 mL Oral BID BM  . gabapentin  600 mg Oral TID  . insulin aspart  0-15 Units Subcutaneous TID WC  . insulin aspart  0-5 Units Subcutaneous QHS  . insulin aspart  4 Units Subcutaneous TID WC  . insulin detemir  20 Units Subcutaneous QHS  . pantoprazole  40 mg Oral Daily  . predniSONE  50 mg Oral Q breakfast  . saccharomyces boulardii  250 mg Oral BID   Continuous Infusions:  PRN Meds: acetaminophen, albuterol, alum & mag hydroxide-simeth, dextrose, ketorolac, methocarbamol, metoprolol tartrate, ondansetron (ZOFRAN) IV, promethazine  Time spent: 35 minutes  Author: Berle Mull, MD [facility] 08/04/2020 2:07 PM  To reach On-call, see care teams to locate the attending and reach out via [email or web address]. Between 7PM-7AM, please contact night-coverage If you still have difficulty reaching the attending provider, please page the [facility] (Director on Call) for [facility] on amion for assistance.

## 2020-08-05 ENCOUNTER — Telehealth (HOSPITAL_COMMUNITY): Payer: No Payment, Other

## 2020-08-05 LAB — GLUCOSE, CAPILLARY
Glucose-Capillary: 152 mg/dL — ABNORMAL HIGH (ref 70–99)
Glucose-Capillary: 199 mg/dL — ABNORMAL HIGH (ref 70–99)
Glucose-Capillary: 402 mg/dL — ABNORMAL HIGH (ref 70–99)
Glucose-Capillary: 343 mg/dL — ABNORMAL HIGH (ref 70–99)

## 2020-08-05 MED ORDER — LINAGLIPTIN 5 MG PO TABS
5.0000 mg | ORAL_TABLET | Freq: Every day | ORAL | Status: DC
  Administered 2020-08-05 – 2020-08-12 (×8): 5 mg via ORAL
  Filled 2020-08-05 (×8): qty 1

## 2020-08-05 MED ORDER — PREDNISONE 20 MG PO TABS
20.0000 mg | ORAL_TABLET | Freq: Every day | ORAL | Status: DC
Start: 2020-08-06 — End: 2020-08-09
  Administered 2020-08-06 – 2020-08-09 (×4): 20 mg via ORAL
  Filled 2020-08-05 (×4): qty 1

## 2020-08-05 MED ORDER — LIVING WELL WITH DIABETES BOOK
Freq: Once | Status: AC
  Filled 2020-08-05: qty 1

## 2020-08-05 NOTE — Progress Notes (Signed)
Spoke with patient on the phone about her diabetes. States that she was diagnosed in 2011. Has been on insulin most of the time since then. She takes Lantus 50 units daily, Humalog 18 units TID with meals. States that her last hgbA1C was close to the 12.3% that it is now. Has been seen at [facility] in the past, but not since last summer 2021. States that she has gotten her insulin from the clinic, but cannot afford $10 to pay for it. States that she cannot payfor the strips for her meter.   Patient states that she had been taking her Lantus and Humalog at home, but was not able to afford a refill in the past. Will need to find a way of getting her insulin and blood glucose strips to use at home. Will need follow up with a PCP. Will order Living Well with Diabetes booklet for her.   Will continue to monitor blood sugars while in the hospital.  Harvel Ricks RN BSN CDE Diabetes Coordinator Pager: [phone]  8am-5pm

## 2020-08-05 NOTE — Progress Notes (Signed)
Occupational Therapy Treatment Patient Details Name: [name] [name] MRN: [id number] DOB: May 27, 1961 Today's Date: 08/05/2020    History of present illness Pt is a 59 y.o. female admitted 07/28/20 with SOB, fatigue and poor p.o. intake; workup for DKA, acute hypoxic respiratory failure due to COVID-19 PNA. PMH includes DM2, HTN, chronic pain syndrome, multiple falls (fall s/p R ankle ORIF 02/25/20).   OT comments  Pt making gradual progress towards OT goals. She continues to present with generalized weakness and easily fatigues, requiring seated rest breaks with short bouts of activity. Pt completing toileting ADL with maxA, following seated rest tolerating short bout of mobility using RW (approx 5' forward/back from recliner) overall with minA. Pt on 2L O2 with SpO2 >/=87%. Continue to recommend post acute rehab services at time of discharge to increase her overall strength/endurance and to progress pt towards her PLOF. Acute OT to follow.    Follow Up Recommendations  SNF    Equipment Recommendations  None recommended by OT (Pt has appropriate DME)          Precautions / Restrictions Precautions Precautions: Fall Precaution Comments: hx of bowel incontinence Restrictions Weight Bearing Restrictions: No       Mobility Bed Mobility               General bed mobility comments: OOB on BSC upon arrival  Transfers Overall transfer level: Needs assistance Equipment used: Rolling walker (2 wheeled);None Transfers: Sit to/from Stand Sit to Stand: Min guard;Min assist         General transfer comment: pt able to stand initially with minguard assist, when fatigued requiring increased boosting/steadying assist. stood from [facility] and x2 from recliner    Balance Overall balance assessment: Needs assistance Sitting-balance support: Feet supported;Single extremity supported Sitting balance-Leahy Scale: Fair     Standing balance support: Bilateral upper extremity supported Standing  balance-Leahy Scale: Poor Standing balance comment: Reliant on UE support; dependent for posterior pericare                           ADL either performed or assessed with clinical judgement   ADL Overall ADL's : Needs assistance/impaired                     Lower Body Dressing: Maximal assistance;Sit to/from stand Lower Body Dressing Details (indicate cue type and reason): requiring assist to don socks Toilet Transfer: Minimal assistance;Stand-pivot   Toileting- Clothing Manipulation and Hygiene: Maximal assistance;Sit to/from stand Toileting - Clothing Manipulation Details (indicate cue type and reason): assist for posterior pericare     Functional mobility during ADLs: Minimal assistance;Rolling walker                         Cognition Arousal/Alertness: Awake/alert Behavior During Therapy: Flat affect Overall Cognitive Status: No family/caregiver present to determine baseline cognitive functioning                                 General Comments: appears [facility] for basic tasks today        Exercises     Shoulder Instructions       General Comments      Pertinent Vitals/ Pain       Pain Assessment: Faces Faces Pain Scale: Hurts a little bit Pain Location: generalized Pain Descriptors / Indicators: Discomfort Pain Intervention(s): Monitored during session  Home Living                                          Prior Functioning/Environment              Frequency  Min 3X/week        Progress Toward Goals  OT Goals(current goals can now be found in the care plan section)  Progress towards OT goals: Progressing toward goals  Acute Rehab OT Goals Patient Stated Goal: Hopeful for post-acute rehab to regain strength before return home OT Goal Formulation: With patient Time For Goal Achievement: 08/16/20 Potential to Achieve Goals: Good ADL Goals Pt Will Perform Grooming: with min guard  assist;standing Pt Will Perform Upper Body Dressing: with set-up;sitting Pt Will Perform Lower Body Dressing: with set-up;sit to/from stand Pt Will Transfer to Toilet: with min guard assist;ambulating Pt Will Perform Toileting - Clothing Manipulation and hygiene: with set-up;sit to/from stand Additional ADL Goal #1: pt will verbalize 3 strategies for energy conservation during ADL with no cues Additional ADL Goal #2: Pt will perform HEP at independent level per handout to strengthen body in preparation for ADL  Plan Discharge plan remains appropriate    Co-evaluation                 AM-PAC OT "6 Clicks" Daily Activity     Outcome Measure   Help from another person eating meals?: None Help from another person taking care of personal grooming?: A Little Help from another person toileting, which includes using toliet, bedpan, or urinal?: A Lot Help from another person bathing (including washing, rinsing, drying)?: A Lot Help from another person to put on and taking off regular upper body clothing?: A Little Help from another person to put on and taking off regular lower body clothing?: A Lot 6 Click Score: 16    End of Session Equipment Utilized During Treatment: Rolling walker;Oxygen  OT Visit Diagnosis: Unsteadiness on feet (R26.81);Other abnormalities of gait and mobility (R26.89);Muscle weakness (generalized) (M62.81);History of falling (Z91.81);Other symptoms and signs involving cognitive function   Activity Tolerance Patient tolerated treatment well   Patient Left in chair;with call bell/phone within reach;with chair alarm set   Nurse Communication Mobility status        Time: 8592-9244 OT Time Calculation (min): 22 min  Charges: OT General Charges $OT Visit: 1 Visit OT Treatments $Self Care/Home Management : 8-22 mins  Lou Cal, OT Acute Rehabilitation Services Pager [phone] Office [phone]     Raymondo Band 08/05/2020, 12:39  PM

## 2020-08-05 NOTE — Progress Notes (Signed)
[facility] Progress Note  Patient: [name] [name]    FYB:[id number]  DOA: 07/28/2020     Date of Service: the patient was seen and examined on 08/05/2020  Brief hospital course: Past medical history of type II DM, HTN and chronic pain syndrome.  Presents with complaints of shortness of breath and fatigue with poor p.o. intake found to have COVID-19 pneumonia as well as DKA.  Admitted to the ICU. Transfer to [facility] on 1/13. Currently plan is arrange for safe discharge.  Assessment and Plan: 1. Acute Hypoxic respiratory failure, POA, 86 % on room air on admission.  Acute COVID-19 Viral Pneumonia Community-acquired pneumonia Sepsis secondary to viral and community-acquired bacterial pneumonia, POA Meeting SIRS criteria on admission with tachycardia, tachypnea and fever. CXR: hazy bilateral peripheral opacities Oxygen requirement: Currently on room air.  At peak was on heated high flow nasal cannula plus nonrebreather CRP: 28.7-10.5-5.9 Remdesivir: Not initiated Steroids: Initially on Solu-Medrol currently on prednisone. Initiate taper to better Baricitinib/Actemra: Started on 1/11, stopped on 1/13 given concern with bacterial infection Antibiotics: Started on IV azithromycin and ceftriaxone on admission continue to complete a 5-day treatment course. DVT Prophylaxis: enoxaparin (LOVENOX) injection 40 mg Start: 07/30/20 1230  Medically stable ready to transfer to SNF.  Prone positioning and incentive spirometer use recommended.  The treatment plan and use of medications and known side effects were discussed with patient/family. It was clearly explained that complete risks and long-term side effects are unknown. Patient/family agree with the treatment plan.    2.  DKA Diabetes mellitus, uncontrolled Anion gap resolved with IV fluids and IV insulin. Currently on sliding scale insulin as well as Levemir. Blood sugars are elevated.  Insulin regimen adjusted as well as prednisone regimen  adjusted. Monitor.  3.  Intractable nausea-resolved Currently resolved.  Oral intake improving.  4.  Diarrhea-resolved Patient reports ongoing diarrhea since she is sick.  Likely associated with antibiotics. Add Florastor. X-ray abdomen is unremarkable. Patient does not have abdominal pain and does not have any diarrhea anymore.  5.  Chronic pain syndrome-stable Continue home regimen. Patient is crying today that she is having pain all over her body. Unable to specify where. Increasing gabapentin to 3 times daily. Pain better. Mood better.   6.  Adrenal insufficiency Cortisol level significantly elevated with concern Florinef held at admission. Currently on prednisone.  Monitor.  Transition back to Florinef once prednisone is tapered off  Diet: Carb modified diet DVT Prophylaxis:   enoxaparin (LOVENOX) injection 40 mg Start: 07/30/20 1230    Advance goals of care discussion: Full code  Family Communication: no family was present at bedside, at the time of interview.   Disposition:  Status is: Inpatient  Remains inpatient appropriate because:IV treatments appropriate due to intensity of illness or inability to take PO   Dispo: The patient is from: Home              Anticipated d/c is to: Home              Anticipated d/c date is: 2 days              Patient currently is not medically stable to d/c.  Subjective: No nausea vomiting or diarrhea.  No abdominal pain.  Sleeping okay.  Continues to report fatigue and tiredness.  Physical Exam:  General: Appear in mild distress, no Rash; Oral Mucosa Clear, moist. no Abnormal Neck Mass Or lumps, Conjunctiva normal  Cardiovascular: S1 and S2 Present, no Murmur, Respiratory: Normal  respiratory effort, Bilateral Air entry present and no crackles, no wheezes Abdomen: Bowel Sound present, Soft and no tenderness Extremities: trace Pedal edema Neurology: alert and oriented to time, place, and person affect appropriate. no new  focal deficit Gait not checked due to patient safety concerns    Vitals:   08/05/20 0650 08/05/20 1048 08/05/20 1716 08/05/20 1945  BP:   (!) 124/59 115/68  Pulse: 99 99 (!) 109 (!) 101  Resp:   20 20  Temp:   98.6 F (37 C) 98.6 F (37 C)  TempSrc:   Oral Oral  SpO2: 91% 96% 92% 92%  Weight:      Height:        Intake/Output Summary (Last 24 hours) at 08/05/2020 2129 Last data filed at 08/05/2020 8099 Gross per 24 hour  Intake 240 ml  Output -  Net 240 ml   Filed Weights   07/29/20 1445 07/31/20 0600 08/05/20 0439  Weight: 56.2 kg 63.5 kg 61.6 kg    Data Reviewed: I have personally reviewed and interpreted daily labs, tele strips, imaging. I reviewed all nursing notes, pharmacy notes, vitals, pertinent old records I have discussed plan of care as described above with RN and patient/family.  CBC: Recent Labs  Lab 07/31/20 0336 08/02/20 0809  WBC 7.9 12.5*  NEUTROABS  --  9.6*  HGB 11.4* 11.6*  HCT 33.9* 32.8*  MCV 90.4 93.7  PLT 160 833   Basic Metabolic Panel: Recent Labs  Lab 07/29/20 2337 07/30/20 0326 07/30/20 0733 07/30/20 1017 07/30/20 1632 07/30/20 2010 07/31/20 0336 07/31/20 0744 07/31/20 1523 08/01/20 0632 08/02/20 0809  NA 132* 137 136  --   --   --   --   --   --  140 139  K 4.0 4.0 4.1  --   --   --   --   --   --  4.4 4.0  CL 104 108 107  --   --   --   --   --   --  109 106  CO2 16* 21* 18*  --   --   --   --   --   --  22 25  GLUCOSE 174* 160* 209*  --   --   --   --   --   --  135* 80  BUN 28* 23* 22*  --   --   --   --   --   --  22* 17  CREATININE 0.72 0.65 0.51  --   --   --   --   --   --  0.49 0.56  CALCIUM 8.3* 8.7* 8.4*  --   --   --   --   --   --  8.1* 8.3*  MG  --   --   --    < > 2.2 1.9 2.0 1.9 2.0  --   --    < > = values in this interval not displayed.    Studies: No results found.  Scheduled Meds: . dextromethorphan-guaiFENesin  1 tablet Oral BID  . DULoxetine  60 mg Oral BID  . enoxaparin (LOVENOX) injection   40 mg Subcutaneous Q24H  . feeding supplement  237 mL Oral BID BM  . gabapentin  600 mg Oral TID  . insulin aspart  0-15 Units Subcutaneous TID WC  . insulin aspart  0-5 Units Subcutaneous QHS  . insulin aspart  4 Units Subcutaneous TID WC  . insulin  detemir  20 Units Subcutaneous QHS  . linagliptin  5 mg Oral Daily  . pantoprazole  40 mg Oral Daily  . [START ON 08/06/2020] predniSONE  20 mg Oral Q breakfast  . saccharomyces boulardii  250 mg Oral BID   Continuous Infusions:  PRN Meds: acetaminophen, albuterol, alum & mag hydroxide-simeth, dextrose, ketorolac, methocarbamol, metoprolol tartrate, ondansetron (ZOFRAN) IV, promethazine  Time spent: 35 minutes  Author: Berle Mull, MD [facility] 08/05/2020 9:29 PM  To reach On-call, see care teams to locate the attending and reach out via [email or web address]. Between 7PM-7AM, please contact night-coverage If you still have difficulty reaching the attending provider, please page the [facility] (Director on Call) for [facility] on amion for assistance.

## 2020-08-05 NOTE — Care Management (Addendum)
7824 08-05-20 Case Manager received a consult for medication assistance. Patient usually goes to the [facility]. Her PCP is no longer at the clinic. Case Manager scheduled appointment with a new provider and placed the appointment on the AVS. Patient will get assistance with orange card once she has her next appointment. Patient states she is working with a Chief Executive Officer for disability- this may take a while. Transitions of care team may have to assist with her medications once she is sable via the [facility] program-medication can be sent to [place]. Patient may be able to take advantage of the one time free fill at the [facility]. Patient states she could not remember if she has used this option. Pharmacy closed unable to verify. Case Manager will continue to follow for additional needs. Bethena Roys, RN,BSN Case Manager

## 2020-08-06 LAB — CBC WITH DIFFERENTIAL/PLATELET
Abs Immature Granulocytes: 0.38 10*3/uL — ABNORMAL HIGH (ref 0.00–0.07)
Abs Immature Granulocytes: 0.51 10*3/uL — ABNORMAL HIGH (ref 0.00–0.07)
Basophils Absolute: 0 10*3/uL (ref 0.0–0.1)
Basophils Absolute: 0 10*3/uL (ref 0.0–0.1)
Basophils Relative: 0 %
Basophils Relative: 0 %
Eosinophils Absolute: 0 10*3/uL (ref 0.0–0.5)
Eosinophils Absolute: 0.1 10*3/uL (ref 0.0–0.5)
Eosinophils Relative: 0 %
Eosinophils Relative: 1 %
HCT: 25.6 % — ABNORMAL LOW (ref 36.0–46.0)
HCT: 29.1 % — ABNORMAL LOW (ref 36.0–46.0)
Hemoglobin: 9.6 g/dL — ABNORMAL LOW (ref 12.0–15.0)
Hemoglobin: 10.6 g/dL — ABNORMAL LOW (ref 12.0–15.0)
Immature Granulocytes: 3 %
Immature Granulocytes: 3 %
Lymphocytes Relative: 6 %
Lymphocytes Relative: 11 %
Lymphs Abs: 1 10*3/uL (ref 0.7–4.0)
Lymphs Abs: 1.9 10*3/uL (ref 0.7–4.0)
MCH: 35.7 pg — ABNORMAL HIGH (ref 26.0–34.0)
MCH: 34.2 pg — ABNORMAL HIGH (ref 26.0–34.0)
MCHC: 37.5 g/dL — ABNORMAL HIGH (ref 30.0–36.0)
MCHC: 36.4 g/dL — ABNORMAL HIGH (ref 30.0–36.0)
MCV: 95.2 fL (ref 80.0–100.0)
MCV: 93.9 fL (ref 80.0–100.0)
Monocytes Absolute: 0.7 10*3/uL (ref 0.1–1.0)
Monocytes Absolute: 0.8 10*3/uL (ref 0.1–1.0)
Monocytes Relative: 5 %
Monocytes Relative: 4 %
Neutro Abs: 12.9 10*3/uL — ABNORMAL HIGH (ref 1.7–7.7)
Neutro Abs: 14.3 10*3/uL — ABNORMAL HIGH (ref 1.7–7.7)
Neutrophils Relative %: 86 %
Neutrophils Relative %: 81 %
Platelets: 305 10*3/uL (ref 150–400)
Platelets: 424 10*3/uL — ABNORMAL HIGH (ref 150–400)
RBC: 2.69 MIL/uL — ABNORMAL LOW (ref 3.87–5.11)
RBC: 3.1 MIL/uL — ABNORMAL LOW (ref 3.87–5.11)
RDW: 14.6 % (ref 11.5–15.5)
RDW: 13.9 % (ref 11.5–15.5)
WBC: 15 10*3/uL — ABNORMAL HIGH (ref 4.0–10.5)
WBC: 17.6 10*3/uL — ABNORMAL HIGH (ref 4.0–10.5)
nRBC: 0 % (ref 0.0–0.2)
nRBC: 0 % (ref 0.0–0.2)

## 2020-08-06 LAB — GLUCOSE, CAPILLARY
Glucose-Capillary: 190 mg/dL — ABNORMAL HIGH (ref 70–99)
Glucose-Capillary: 284 mg/dL — ABNORMAL HIGH (ref 70–99)
Glucose-Capillary: 316 mg/dL — ABNORMAL HIGH (ref 70–99)
Glucose-Capillary: 270 mg/dL — ABNORMAL HIGH (ref 70–99)

## 2020-08-06 LAB — COMPREHENSIVE METABOLIC PANEL
ALT: 77 U/L — ABNORMAL HIGH (ref 0–44)
AST: 46 U/L — ABNORMAL HIGH (ref 15–41)
Albumin: 1.8 g/dL — ABNORMAL LOW (ref 3.5–5.0)
Alkaline Phosphatase: 200 U/L — ABNORMAL HIGH (ref 38–126)
Anion gap: 9 (ref 5–15)
BUN: 16 mg/dL (ref 6–20)
CO2: 25 mmol/L (ref 22–32)
Calcium: 8 mg/dL — ABNORMAL LOW (ref 8.9–10.3)
Chloride: 98 mmol/L (ref 98–111)
Creatinine, Ser: 0.6 mg/dL (ref 0.44–1.00)
GFR, Estimated: >60 mL/min (ref >60)
Glucose, Bld: 275 mg/dL — ABNORMAL HIGH (ref 70–99)
Potassium: 4.3 mmol/L (ref 3.5–5.1)
Sodium: 132 mmol/L — ABNORMAL LOW (ref 135–145)
Total Bilirubin: 0.4 mg/dL (ref 0.3–1.2)
Total Protein: 5.3 g/dL — ABNORMAL LOW (ref 6.5–8.1)

## 2020-08-06 LAB — MAGNESIUM: Magnesium: 1.8 mg/dL (ref 1.7–2.4)

## 2020-08-06 MED ORDER — TRAZODONE HCL 50 MG PO TABS
50.0000 mg | ORAL_TABLET | Freq: Every day | ORAL | Status: DC
  Administered 2020-08-06 – 2020-08-11 (×6): 50 mg via ORAL
  Filled 2020-08-06 (×6): qty 1

## 2020-08-06 MED ORDER — SODIUM CHLORIDE 0.9 % IV BOLUS
1000.0000 mL | Freq: Once | INTRAVENOUS | Status: AC
  Administered 2020-08-06: 1000 mL via INTRAVENOUS

## 2020-08-06 MED ORDER — CHOLESTYRAMINE 4 G PO PACK
4.0000 g | PACK | Freq: Every day | ORAL | Status: DC
  Administered 2020-08-06 – 2020-08-07 (×2): 4 g via ORAL
  Filled 2020-08-06 (×2): qty 1

## 2020-08-06 MED ORDER — LACTATED RINGERS IV SOLN: INTRAVENOUS | Status: DC

## 2020-08-06 MED ORDER — INSULIN DETEMIR 100 UNIT/ML ~~LOC~~ SOLN
30.0000 [IU] | Freq: Every day | SUBCUTANEOUS | Status: DC
  Administered 2020-08-06 – 2020-08-08 (×3): 30 [IU] via SUBCUTANEOUS
  Filled 2020-08-06 (×4): qty 0.3

## 2020-08-06 NOTE — Progress Notes (Addendum)
[facility] Progress Note  Patient: [name] [name]    IWL:[id number]  DOA: 07/28/2020     Date of Service: the patient was seen and examined on 08/06/2020  Brief hospital course: Past medical history of type II DM, HTN and chronic pain syndrome.  Presents with complaints of shortness of breath and fatigue with poor p.o. intake found to have COVID-19 pneumonia as well as DKA.  Admitted to the ICU. Transfer to [facility] on 1/13. Currently plan is IV resuscitation and monitor for improvement in orthostasis and diarrhea.  Assessment and Plan: 1. Acute Hypoxic respiratory failure, POA, 86 % on room air on admission.  Acute COVID-19 Viral Pneumonia Community-acquired pneumonia Sepsis secondary to viral and community-acquired bacterial pneumonia, POA Meeting SIRS criteria on admission with tachycardia, tachypnea and fever. CXR: hazy bilateral peripheral opacities Oxygen requirement: Currently on room air.  At peak was on heated high flow nasal cannula plus nonrebreather CRP: 28.7-10.5-5.9 Remdesivir: Not initiated Steroids: Initially on Solu-Medrol currently on prednisone. Initiate taper Baricitinib/Actemra: Started on 1/11, stopped on 1/13 given concern with bacterial infection Antibiotics: Started on IV azithromycin and ceftriaxone on admission continue to complete a 5-day treatment course. DVT Prophylaxis: enoxaparin (LOVENOX) injection 40 mg Start: 07/30/20 1230  Prone positioning and incentive spirometer use recommended.  The treatment plan and use of medications and known side effects were discussed with patient/family. It was clearly explained that complete risks and long-term side effects are unknown. Patient/family agree with the treatment plan.    2.  DKA Type of diabetes mellitus, uncontrolled with hyperglycemia with diabetic neuropathy  Anion gap resolved with IV fluids and IV insulin.  Treated in the ICU. Currently on sliding scale insulin as well as Levemir. Blood sugars are  elevated.  Dose adjusted. Also prednisone regimen adjusted. Monitor.  3.  Intractable nausea-resolved Currently resolved.  Oral intake improving.  4.  Diarrhea Reports diarrhea again on 1/18 without any abdominal pain nausea. Patient reports ongoing diarrhea since she is sick.  Likely associated with antibiotics. Continue Florastor.  Add Questran. X-ray abdomen is unremarkable. Patient does not have abdominal pain.   5.  Chronic pain syndrome-stable Continue home regimen. Increasing gabapentin to 3 times daily. Pain better. Mood better.   6.  Adrenal insufficiency Cortisol level significantly elevated with concern Florinef held at admission. Currently on prednisone.  Monitor.  Transition back to Florinef once prednisone is tapered off  7. orthostatic hypotension. Etiology not clear likely dehydration hyperglycemia and from diarrhea. Orthostasis positive. Treated with IV fluids and monitor.  Diet: Carb modified diet DVT Prophylaxis:   enoxaparin (LOVENOX) injection 40 mg Start: 07/30/20 1230    Advance goals of care discussion: Full code  Family Communication: no family was present at bedside, at the time of interview.    Disposition:  Status is: Inpatient  Remains inpatient appropriate because:IV treatments appropriate due to intensity of illness or inability to take PO   Dispo: The patient is from: Home              Anticipated d/c is to: Home              Anticipated d/c date is: 2 days              Patient currently is not medically stable to d/c.  Subjective: No nausea or vomiting or fever.  Patient reports 3 episodes of loose watery bowel movement.  Patient reports ongoing fatigue.  No chest pain.  No abdominal pain.  No blood in the  stool.  Physical Exam:  General: Appear in mild distress, no Rash; Oral Mucosa Clear, moist. no Abnormal Neck Mass Or lumps, Conjunctiva normal  Cardiovascular: S1 and S2 Present, no Murmur, Respiratory: normal respiratory  effort, Bilateral Air entry present and faint Crackles, no wheezes Abdomen: Bowel Sound present, Soft and no tenderness Extremities: trace Pedal edema Neurology: alert and oriented to time, place, and person affect appropriate. no new focal deficit Gait not checked due to patient safety concerns  Vitals:   08/06/20 1453 08/06/20 1455 08/06/20 1456 08/06/20 1555  BP: 129/68 115/68 (!) 86/54 128/72  Pulse: (!) 111 (!) 113 (!) 114 (!) 109  Resp:      Temp:    98.6 F (37 C)  TempSrc:    Oral  SpO2: 94% 94% 94% 91%  Weight:      Height:        Intake/Output Summary (Last 24 hours) at 08/06/2020 1620 Last data filed at 08/06/2020 1618 Gross per 24 hour  Intake 1703.71 ml  Output --  Net 1703.71 ml   Filed Weights   07/29/20 1445 07/31/20 0600 08/05/20 0439  Weight: 56.2 kg 63.5 kg 61.6 kg    Data Reviewed: I have personally reviewed and interpreted daily labs, tele strips, imaging. I reviewed all nursing notes, pharmacy notes, vitals, pertinent old records I have discussed plan of care as described above with RN and patient/family.  CBC: Recent Labs  Lab 07/31/20 0336 08/02/20 0809 08/06/20 0106 08/06/20 0757  WBC 7.9 12.5* 15.0* 17.6*  NEUTROABS  --  9.6* 12.9* 14.3*  HGB 11.4* 11.6* 9.6* 10.6*  HCT 33.9* 32.8* 25.6* 29.1*  MCV 90.4 93.7 95.2 93.9  PLT 160 201 305 893*   Basic Metabolic Panel: Recent Labs  Lab 07/30/20 2010 07/31/20 0336 07/31/20 0744 07/31/20 1523 08/01/20 0632 08/02/20 0809 08/06/20 0106  NA  --   --   --   --  140 139 132*  K  --   --   --   --  4.4 4.0 4.3  CL  --   --   --   --  109 106 98  CO2  --   --   --   --  22 25 25   GLUCOSE  --   --   --   --  135* 80 275*  BUN  --   --   --   --  22* 17 16  CREATININE  --   --   --   --  0.49 0.56 0.60  CALCIUM  --   --   --   --  8.1* 8.3* 8.0*  MG 1.9 2.0 1.9 2.0  --   --  1.8    Studies: No results found.  Scheduled Meds: . cholestyramine  4 g Oral Q1400  .  dextromethorphan-guaiFENesin  1 tablet Oral BID  . DULoxetine  60 mg Oral BID  . enoxaparin (LOVENOX) injection  40 mg Subcutaneous Q24H  . feeding supplement  237 mL Oral BID BM  . gabapentin  600 mg Oral TID  . insulin aspart  0-15 Units Subcutaneous TID WC  . insulin aspart  0-5 Units Subcutaneous QHS  . insulin aspart  4 Units Subcutaneous TID WC  . insulin detemir  30 Units Subcutaneous QHS  . linagliptin  5 mg Oral Daily  . pantoprazole  40 mg Oral Daily  . predniSONE  20 mg Oral Q breakfast  . saccharomyces boulardii  250 mg Oral BID  .  traZODone  50 mg Oral QHS   Continuous Infusions:  PRN Meds: acetaminophen, albuterol, alum & mag hydroxide-simeth, dextrose, ketorolac, methocarbamol, metoprolol tartrate, ondansetron (ZOFRAN) IV, promethazine  Time spent: 35 minutes  Author: Berle Mull, MD [facility] 08/06/2020 4:20 PM  To reach On-call, see care teams to locate the attending and reach out via [email or web address]. Between 7PM-7AM, please contact night-coverage If you still have difficulty reaching the attending provider, please page the [facility] (Director on Call) for [facility] on amion for assistance.

## 2020-08-06 NOTE — Progress Notes (Signed)
Physical Therapy Treatment Patient Details Name: [name] [name] MRN: [id number] DOB: 04/12/1961 Today's Date: 08/06/2020    History of Present Illness Pt is a 60 y.o. female admitted 07/28/20 with SOB, fatigue and poor p.o. intake; workup for DKA, acute hypoxic respiratory failure due to COVID-19 PNA. PMH includes DM2, HTN, chronic pain syndrome, multiple falls (fall s/p R ankle ORIF 02/25/20).   PT Comments    Pt slowly progressing with mobility. Tolerates short bouts of standing activity with RW and minA, only able to walk a few feet before needing prolonged seated rest breaks due to c/o significant fatigue and weakness. BP down to 93/58 with standing, returning to 124/64 with seated rest in recliner. Pt continues to require assist for ADL tasks; pt reports she is unsure any family able to provide this level of assist. Continue to recommend SNF-level therapies to maximize functional mobility and independence; pt understands lack of insurance means difficult placement.   Follow Up Recommendations  SNF;Supervision for mobility/OOB     Equipment Recommendations  None recommended by PT    Recommendations for Other Services       Precautions / Restrictions Precautions Precautions: Fall;Other (comment) Precaution Comments: bowel incontinence/urgency; bout of symptomatic hypotension 1/18 Restrictions Weight Bearing Restrictions: No    Mobility  Bed Mobility               General bed mobility comments: Received sitting in recliner  Transfers Overall transfer level: Needs assistance Equipment used: Rolling walker (2 wheeled) Transfers: Sit to/from Stand Sit to Stand: Min guard;Min assist         General transfer comment: Multiple sit<>stands from recliner, BSC, bed and straight back chair to RW, pt fluctuating from min guard to minA for stability; increased time and effort with each trial due to fatigue  Ambulation/Gait Ambulation/Gait assistance: Min guard;Min assist Gait  Distance (Feet): 26 Feet Assistive device: Rolling walker (2 wheeled) Gait Pattern/deviations: Step-through pattern;Decreased stride length;Trunk flexed Gait velocity: Decreased   General Gait Details: Slow, labored gait with RW, walking 16' + 6' + 4', requiring seated rest breaks due to fatigue, and 1x quick seated rest break due to c/o dizziness (BP 110/62) with minA for eccentric lower to safe sitting position. Pt reports "my arms feel like noodles" - encouraged pt to increase WBAT through BLEs to offload tired arms   Stairs             Wheelchair Mobility    Modified Rankin (Stroke Patients Only)       Balance Overall balance assessment: Needs assistance Sitting-balance support: Feet supported;Single extremity supported Sitting balance-Leahy Scale: Fair     Standing balance support: Bilateral upper extremity supported Standing balance-Leahy Scale: Poor Standing balance comment: Reliant on UE support; dependent for posterior pericare                            Cognition Arousal/Alertness: Awake/alert Behavior During Therapy: Flat affect Overall Cognitive Status: No family/caregiver present to determine baseline cognitive functioning Area of Impairment: Awareness;Problem solving                           Awareness: Emergent Problem Solving: Requires verbal cues        Exercises      General Comments General comments (skin integrity, edema, etc.): Pt with c/o dizziness when walking away from sink, seated BP 110/62, after additional steps to chair BP 93/58, seated w/  legs recliend BP up to 124/64; SpO2 89-92% on RA, DOE 3-4/4. Pt limited by c/o fatigue, weakness and back pain with all mobility      Pertinent Vitals/Pain Pain Assessment: Faces Faces Pain Scale: Hurts little more Pain Location: Lower back Pain Descriptors / Indicators: Constant;Discomfort Pain Intervention(s): Monitored during session;Limited activity within patient's  tolerance;Repositioned    Home Living                      Prior Function            PT Goals (current goals can now be found in the care plan section) Progress towards PT goals: Progressing toward goals    Frequency    Min 3X/week      PT Plan Frequency needs to be updated    Co-evaluation              AM-PAC PT "6 Clicks" Mobility   Outcome Measure  Help needed turning from your back to your side while in a flat bed without using bedrails?: A Little Help needed moving from lying on your back to sitting on the side of a flat bed without using bedrails?: A Little Help needed moving to and from a bed to a chair (including a wheelchair)?: A Little Help needed standing up from a chair using your arms (e.g., wheelchair or bedside chair)?: A Little Help needed to walk in hospital room?: A Little Help needed climbing 3-5 steps with a railing? : A Lot 6 Click Score: 17    End of Session   Activity Tolerance: Patient limited by fatigue Patient left: in chair;with call bell/phone within reach;with chair alarm set Nurse Communication: Mobility status PT Visit Diagnosis: Other abnormalities of gait and mobility (R26.89);Muscle weakness (generalized) (M62.81)     Time: 0488-8916 PT Time Calculation (min) (ACUTE ONLY): 39 min  Charges:  $Gait Training: 8-22 mins $Therapeutic Activity: 23-37 mins                    Mabeline Caras, PT, DPT Acute Rehabilitation Services  Pager [phone] Office [phone]  Derry Lory 08/06/2020, 11:28 AM

## 2020-08-06 NOTE — Progress Notes (Signed)
Inpatient Diabetes Program Recommendations  AACE/ADA: New Consensus Statement on Inpatient Glycemic Control (2015)  Target Ranges:  Prepandial:   less than 140 mg/dL      Peak postprandial:   less than 180 mg/dL (1-2 hours)      Critically ill patients:  140 - 180 mg/dL   Lab Results  Component Value Date   GLUCAP 190 (H) 08/06/2020   HGBA1C 12.3 (H) 07/29/2020    Review of Glycemic Control Results for JESSALYN, HINOJOSA (MRN [id number]) as of 08/06/2020 09:42  Ref. Range 08/05/2020 08:24 08/05/2020 12:09 08/05/2020 17:17 08/05/2020 19:47 08/06/2020 08:00  Glucose-Capillary Latest Ref Range: 70 - 99 mg/dL 152 (H) 199 (H) 402 (H) 343 (H) 190 (H)    Inpatient Diabetes Program Recommendations:    Might consider Novolog 6 units TID with meals if eats at least 50% of meal.  Will continue to follow while inpatient.  Thank you, Reche Dixon, RN, BSN Diabetes Coordinator Inpatient Diabetes Program [phone] (team pager from 8a-5p)

## 2020-08-07 DIAGNOSIS — R0902 Hypoxemia: Secondary | ICD-10-CM

## 2020-08-07 LAB — BASIC METABOLIC PANEL
Anion gap: 7 (ref 5–15)
BUN: 10 mg/dL (ref 6–20)
CO2: 27 mmol/L (ref 22–32)
Calcium: 8 mg/dL — ABNORMAL LOW (ref 8.9–10.3)
Chloride: 102 mmol/L (ref 98–111)
Creatinine, Ser: 0.54 mg/dL (ref 0.44–1.00)
GFR, Estimated: >60 mL/min (ref >60)
Glucose, Bld: 230 mg/dL — ABNORMAL HIGH (ref 70–99)
Potassium: 4.3 mmol/L (ref 3.5–5.1)
Sodium: 136 mmol/L (ref 135–145)

## 2020-08-07 LAB — GLUCOSE, CAPILLARY
Glucose-Capillary: 251 mg/dL — ABNORMAL HIGH (ref 70–99)
Glucose-Capillary: 258 mg/dL — ABNORMAL HIGH (ref 70–99)
Glucose-Capillary: 353 mg/dL — ABNORMAL HIGH (ref 70–99)
Glucose-Capillary: 338 mg/dL — ABNORMAL HIGH (ref 70–99)

## 2020-08-07 LAB — CBC
HCT: 27.5 % — ABNORMAL LOW (ref 36.0–46.0)
Hemoglobin: 9.5 g/dL — ABNORMAL LOW (ref 12.0–15.0)
MCH: 33.1 pg (ref 26.0–34.0)
MCHC: 34.5 g/dL (ref 30.0–36.0)
MCV: 95.8 fL (ref 80.0–100.0)
Platelets: 340 10*3/uL (ref 150–400)
RBC: 2.87 MIL/uL — ABNORMAL LOW (ref 3.87–5.11)
RDW: 13.6 % (ref 11.5–15.5)
WBC: 12.9 10*3/uL — ABNORMAL HIGH (ref 4.0–10.5)
nRBC: 0 % (ref 0.0–0.2)

## 2020-08-07 LAB — MAGNESIUM: Magnesium: 1.9 mg/dL (ref 1.7–2.4)

## 2020-08-07 MED ORDER — LOPERAMIDE HCL 2 MG PO CAPS
2.0000 mg | ORAL_CAPSULE | ORAL | Status: DC | PRN
  Administered 2020-08-07 – 2020-08-09 (×3): 2 mg via ORAL
  Filled 2020-08-07 (×4): qty 1

## 2020-08-07 MED ORDER — FLUDROCORTISONE ACETATE 0.1 MG PO TABS
0.1000 mg | ORAL_TABLET | Freq: Every day | ORAL | Status: DC
  Administered 2020-08-07 – 2020-08-09 (×3): 0.1 mg via ORAL
  Filled 2020-08-07 (×3): qty 1

## 2020-08-07 NOTE — Progress Notes (Signed)
Inpatient Diabetes Program Recommendations  AACE/ADA: New Consensus Statement on Inpatient Glycemic Control (2015)  Target Ranges:  Prepandial:   less than 140 mg/dL      Peak postprandial:   less than 180 mg/dL (1-2 hours)      Critically ill patients:  140 - 180 mg/dL   Lab Results  Component Value Date   GLUCAP 258 (H) 08/07/2020   HGBA1C 12.3 (H) 07/29/2020    Review of Glycemic Control  Diabetes history: DM2 Outpatient Diabetes medications: Lantus 50 units QD, Humalog 25 units TID Current orders for Inpatient glycemic control: Levemir 30 units QHS, Novolog 0-15 units tidwc and hs + 4 units tid, tradjenta 5 mg QD On Pred 20 mg QAM  Inpatient Diabetes Program Recommendations:     Increase Levemir to 35 units QHS Increase Novolog to 6 units tidwc for meal coverage insulin.  TOC following for med needs at d/c.  Continue to follow.  Thank you. Lorenda Peck, RD, LDN, CDE Inpatient Diabetes Coordinator [phone]

## 2020-08-07 NOTE — Progress Notes (Signed)
Physical Therapy Treatment Patient Details Name: [name] [name] MRN: [id number] DOB: 02-10-1961 Today's Date: 08/07/2020    History of Present Illness Pt is a 60 y.o. female admitted 07/28/20 with SOB, fatigue and poor p.o. intake; workup for DKA, acute hypoxic respiratory failure due to COVID-19 PNA. PMH includes DM2, HTN, chronic pain syndrome, multiple falls (fall s/p R ankle ORIF 02/25/20).    PT Comments    Pt making gradual progress but remains extremely weak.  She ambulated multiple bouts with RW, min A , and seated rest breaks.  She was on RA with stable vitals.  Attempted exercise with grade 1 T band but too weak and only able to tolerate AROM.  Continue to progress as able.    Follow Up Recommendations  SNF;Supervision for mobility/OOB     Equipment Recommendations  None recommended by PT    Recommendations for Other Services       Precautions / Restrictions Precautions Precautions: Fall;Other (comment) Precaution Comments: bowel incontinence/urgency; bout of symptomatic hypotension 1/18    Mobility  Bed Mobility               General bed mobility comments: Received sitting in recliner  Transfers Overall transfer level: Needs assistance Equipment used: Rolling walker (2 wheeled) Transfers: Sit to/from Stand Sit to Stand: Min assist         General transfer comment: Sit to stand x 4 throughout session with cues for hand placement  Ambulation/Gait Ambulation/Gait assistance: Min assist Gait Distance (Feet): 16 Feet (8', 8', then 16') Assistive device: Rolling walker (2 wheeled) Gait Pattern/deviations: Step-to pattern;Decreased stride length;Trunk flexed Gait velocity: Decreased   General Gait Details: Slow, labored gait with RW.  Ambulate 8', 8', then 59' with seated rest breaks due to fatigue, rested ~3 mins.   Stairs             Wheelchair Mobility    Modified Rankin (Stroke Patients Only)       Balance Overall balance assessment: Needs  assistance Sitting-balance support: Feet supported;No upper extremity supported Sitting balance-Leahy Scale: Fair     Standing balance support: Bilateral upper extremity supported Standing balance-Leahy Scale: Poor Standing balance comment: Reliant on UE support                            Cognition Arousal/Alertness: Awake/alert Behavior During Therapy: Flat affect Overall Cognitive Status: No family/caregiver present to determine baseline cognitive functioning Area of Impairment: Awareness;Problem solving                           Awareness: Emergent Problem Solving: Requires verbal cues        Exercises General Exercises - Lower Extremity Ankle Circles/Pumps: AROM;Both;10 reps;Seated Long Arc Quad: AROM;5 reps;Seated;Both (AROM extension and very min resistance eccentric) Hip ABduction/ADduction: AROM;Both;5 reps;Seated (Clam Shell in seated position with min resistance from therapist) Hip Flexion/Marching: AROM;Both;5 reps;Seated    General Comments General comments (skin integrity, edema, etc.): Pt on RA with O2 sats >92%.  No c/o dizziness today.      Pertinent Vitals/Pain Pain Assessment: No/denies pain    Home Living                      Prior Function            PT Goals (current goals can now be found in the care plan section) Acute Rehab PT Goals Patient Stated  Goal: Hopeful for post-acute rehab to regain strength before return home PT Goal Formulation: With patient Time For Goal Achievement: 08/16/20 Potential to Achieve Goals: Good Progress towards PT goals: Progressing toward goals    Frequency    Min 3X/week      PT Plan Current plan remains appropriate    Co-evaluation              AM-PAC PT "6 Clicks" Mobility   Outcome Measure  Help needed turning from your back to your side while in a flat bed without using bedrails?: A Little Help needed moving from lying on your back to sitting on the side of a  flat bed without using bedrails?: A Little Help needed moving to and from a bed to a chair (including a wheelchair)?: A Little Help needed standing up from a chair using your arms (e.g., wheelchair or bedside chair)?: A Little Help needed to walk in hospital room?: A Little Help needed climbing 3-5 steps with a railing? : A Lot 6 Click Score: 17    End of Session Equipment Utilized During Treatment: Gait belt Activity Tolerance: Patient limited by fatigue Patient left: in chair;with call bell/phone within reach;with chair alarm set Nurse Communication: Mobility status PT Visit Diagnosis: Other abnormalities of gait and mobility (R26.89);Muscle weakness (generalized) (M62.81)     Time: 7782-4235 PT Time Calculation (min) (ACUTE ONLY): 23 min  Charges:  $Gait Training: 8-22 mins $Therapeutic Exercise: 8-22 mins                     Abran Richard, PT Acute Rehab Services Pager [phone] [facility] 08/07/2020, 5:34 PM  [phone]}

## 2020-08-07 NOTE — Progress Notes (Signed)
PROGRESS NOTE                                                                                                                                                                                                             Patient Demographics:    [name] [name], is a 60 y.o. female, DOB - [birth year], [id number]  Outpatient Primary MD for the patient is Antony Blackbird, MD (Inactive)   Admit date - 07/28/2020   LOS - 9  Chief Complaint  Patient presents with  . Shortness of Breath       Brief Narrative: Patient is a 60 y.o. female with PMHx of DM-2, HTN, adrenal insufficiency, peripheral neuropathy, chronic back pain admitted by PCCM to the ICU for DKA and COVID-19 pneumonia.  Upon stability-transfer to [facility].   COVID-19 vaccinated status:   Significant Events: 1/9>> Admit to ICU for DKA/hypoxia due to COVID-19 pneumonia 1/13>> transfer to [facility]  Significant studies: 1/9>> chest x-ray: No active disease 1/10>> bilateral lower extremity Doppler: No DVT. 1/10>> Echo: EF 65-70%. 1/10>> chest x-ray: Mild left lower lobe opacity 1/10>> portable abdomen x-ray: Enteric tube projecting over the midline, progressive bibasilar opacities. 1/12>> chest x-ray: Bibasilar pneumonia 1/15>> x-ray abdomen portable: Nonobstructive bowel gas pattern.  COVID-19 medications: Steroids: 1/10>> Baricitinib: 1/11>> 1/13  Antibiotics: Rocephin: 1/10 >> 1/14  Zithromax: 1/10 >> 1/14   Microbiology data: 1/10 >>blood culture: No growth  Procedures: None  Consults: PCCM  DVT prophylaxis: enoxaparin (LOVENOX) injection 40 mg Start: 07/30/20 1230    Subjective:   Feels better today-diarrhea patient has slowed down and getting more formed.  Per nursing staff-no diarrhea today.   Assessment  & Plan :   Acute Hypoxic Resp Failure due to Covid 19 Viral pneumonia: Improved-continue to titrate down oxygen-on  tapering prednisone.  Fever: afebrile O2 requirements:  SpO2: 92 % O2 Flow Rate (L/min): 3 L/min FiO2 (%): (S) 40 %   COVID-19 Labs: No results for input(s): DDIMER, FERRITIN, LDH, CRP in the last 72 hours.  No results found for: BNP  Recent Labs  Lab 08/02/20 0809 08/03/20 0311  PROCALCITON 0.79 0.67    Lab Results  Component Value Date   SARSCOV2NAA POSITIVE (A) 07/28/2020   SARSCOV2NAA NEGATIVE 02/25/2020   SARSCOV2NAA NEGATIVE 12/08/2019   [place] NEGATIVE 09/06/2019     Prone/Incentive Spirometry:  encouraged  incentive spirometry use 3-4/hour.  DKA: Resolved-required insulin infusion on admission.  Insulin-dependent DM-2 (A1c 12.3 on 1/10): CBGs relatively stable-continue Levemir 30 units daily at bedtime, 4 units of NovoLog with meals and SSI.  Follow and adjust.   Recent Labs    08/06/20 2025 08/07/20 0739 08/07/20 1156  GLUCAP 270* 251* 258*   Orthostatic hypotension: Likely due to combination of underlying adrenal insufficiency and diarrhea.  Volume status now stable-diarrhea has resolved.  Stop IV fluids-restart Florinef-recheck orthostatic vital signs in the next day or so.  History of adrenal insufficiency: Maintained on low-dose prednisone for now given orthostatic vital signs-restart Florinef.  Follow and adjust.  Diarrhea: Unclear etiology-significantly improved-stop Questran-if diarrhea reoccurs-we will try some Imodium.  If diarrhea recurs and becomes persistent-May need further work-up.  Peripheral neuropathy: Continue Cymbalta/gabapentin  GI prophylaxis: PPI  ABG:    Component Value Date/Time   HCO3 13.4 (L) 07/29/2020 0917   TCO2 14 (L) 07/29/2020 0917   ACIDBASEDEF 11.0 (H) 07/29/2020 0917   O2SAT 87.0 07/29/2020 0917    Vent Settings: N/A  Condition - Stable  Family Communication  :  Daughter-[phone] Left voicemail on 1/19  Code Status :  Full Code  Diet :  Diet Order            Diet Carb Modified Fluid  consistency: Thin; Room service appropriate? Yes  Diet effective now                  Disposition Plan  :   Status is: Inpatient  Remains inpatient appropriate because:Inpatient level of care appropriate due to severity of illness   Dispo:  Patient From: Home  Planned Disposition: Home vs snf  Expected discharge date: 08/08/2020  Medically stable for discharge: Yes   Barriers to discharge: Diarrhea/orthostatic hypotension-needs SNF.  Antimicorbials  :    Anti-infectives (From admission, onward)   Start     Dose/Rate Route Frequency Ordered Stop   07/30/20 0015  azithromycin (ZITHROMAX) 500 mg in sodium chloride 0.9 % 250 mL IVPB  Status:  Discontinued        500 mg 250 mL/hr over 60 Minutes Intravenous Every 24 hours 07/29/20 2326 08/03/20 0756   07/29/20 1600  cefTRIAXone (ROCEPHIN) 2 g in sodium chloride 0.9 % 100 mL IVPB        2 g 200 mL/hr over 30 Minutes Intravenous Every 24 hours 07/29/20 1504 08/03/20 1000   07/29/20 1600  doxycycline (VIBRA-TABS) tablet 100 mg  Status:  Discontinued        100 mg Oral Every 12 hours 07/29/20 1504 07/29/20 2326      Inpatient Medications  Scheduled Meds: . cholestyramine  4 g Oral Q1400  . dextromethorphan-guaiFENesin  1 tablet Oral BID  . DULoxetine  60 mg Oral BID  . enoxaparin (LOVENOX) injection  40 mg Subcutaneous Q24H  . feeding supplement  237 mL Oral BID BM  . gabapentin  600 mg Oral TID  . insulin aspart  0-15 Units Subcutaneous TID WC  . insulin aspart  0-5 Units Subcutaneous QHS  . insulin aspart  4 Units Subcutaneous TID WC  . insulin detemir  30 Units Subcutaneous QHS  . linagliptin  5 mg Oral Daily  . pantoprazole  40 mg Oral Daily  . predniSONE  20 mg Oral Q breakfast  . saccharomyces boulardii  250 mg Oral BID  . traZODone  50 mg Oral QHS   Continuous Infusions: . lactated ringers 75 mL/hr at 08/07/20  1400   PRN Meds:.acetaminophen, albuterol, alum & mag hydroxide-simeth, dextrose, loperamide,  methocarbamol, metoprolol tartrate, ondansetron (ZOFRAN) IV, promethazine   Time Spent in minutes  25  See all Orders from today for further details   Oren Binet M.D on 08/07/2020 at 2:20 PM  To page go to [email or web address] - use universal password  [facility] -  Office  [phone]    Objective:   Vitals:   08/06/20 2000 08/06/20 2025 08/06/20 2200 08/07/20 0425  BP:  (!) 142/69  120/68  Pulse:  93  (!) 102  Resp:  18  18  Temp:  98.3 F (36.8 C)  97.8 F (36.6 C)  TempSrc:  Oral  Oral  SpO2: 94% 95% 91% 92%  Weight:      Height:        Wt Readings from Last 3 Encounters:  08/05/20 61.6 kg  06/02/20 63.5 kg  04/20/20 63.5 kg     Intake/Output Summary (Last 24 hours) at 08/07/2020 1420 Last data filed at 08/07/2020 1400 Gross per 24 hour  Intake 2604.3 ml  Output -  Net 2604.3 ml     Physical Exam Gen Exam:Alert awake-not in any distress HEENT:atraumatic, normocephalic Chest: B/L clear to auscultation anteriorly CVS:S1S2 regular Abdomen:soft non tender, non distended Extremities:no edema Neurology: Non focal Skin: no rash   Data Review:    CBC Recent Labs  Lab 08/02/20 0809 08/06/20 0106 08/06/20 0757 08/07/20 0233  WBC 12.5* 15.0* 17.6* 12.9*  HGB 11.6* 9.6* 10.6* 9.5*  HCT 32.8* 25.6* 29.1* 27.5*  PLT 201 305 424* 340  MCV 93.7 95.2 93.9 95.8  MCH 33.1 35.7* 34.2* 33.1  MCHC 35.4 37.5* 36.4* 34.5  RDW 13.4 14.6 13.9 13.6  LYMPHSABS 1.3 1.0 1.9  --   MONOABS 1.2* 0.7 0.8  --   EOSABS 0.0 0.0 0.1  --   BASOSABS 0.1 0.0 0.0  --     Chemistries  Recent Labs  Lab 07/31/20 1523 08/01/20 0632 08/02/20 0809 08/06/20 0106 08/07/20 0233  NA  --  140 139 132* 136  K  --  4.4 4.0 4.3 4.3  CL  --  109 106 98 102  CO2  --  22 25 25 27   GLUCOSE  --  135* 80 275* 230*  BUN  --  22* 17 16 10   CREATININE  --  0.49 0.56 0.60 0.54  CALCIUM  --  8.1* 8.3* 8.0* 8.0*  MG 2.0  --   --  1.8 1.9  AST  --   --  71* 46*  --   ALT  --   --   72* 77*  --   ALKPHOS  --   --  174* 200*  --   BILITOT  --   --  0.8 0.4  --    ------------------------------------------------------------------------------------------------------------------ No results for input(s): CHOL, HDL, LDLCALC, TRIG, CHOLHDL, LDLDIRECT in the last 72 hours.  Lab Results  Component Value Date   HGBA1C 12.3 (H) 07/29/2020   ------------------------------------------------------------------------------------------------------------------ No results for input(s): TSH, T4TOTAL, T3FREE, THYROIDAB in the last 72 hours.  Invalid input(s): FREET3 ------------------------------------------------------------------------------------------------------------------ No results for input(s): VITAMINB12, FOLATE, FERRITIN, TIBC, IRON, RETICCTPCT in the last 72 hours.  Coagulation profile No results for input(s): INR, PROTIME in the last 168 hours.  No results for input(s): DDIMER in the last 72 hours.  Cardiac Enzymes No results for input(s): CKMB, TROPONINI, MYOGLOBIN in the last 168 hours.  Invalid input(s): CK ------------------------------------------------------------------------------------------------------------------ No results found for: BNP  Micro Results Recent Results (from the past 240 hour(s))  Resp Panel by RT-PCR (Flu A&B, Covid) Nasopharyngeal Swab     Status: Abnormal   Collection Time: 07/28/20  6:12 PM   Specimen: Nasopharyngeal Swab; Nasopharyngeal(NP) swabs in vial transport medium  Result Value Ref Range Status   SARS Coronavirus 2 by RT PCR POSITIVE (A) NEGATIVE Final    Comment: RESULT CALLED TO, READ BACK BY AND VERIFIED WITH: RN Chrissie Noa [id number] FCP (NOTE) SARS-CoV-2 target nucleic acids are DETECTED.  The SARS-CoV-2 RNA is generally detectable in upper respiratory specimens during the acute phase of infection. Positive results are indicative of the presence of the identified virus, but do not rule out bacterial infection or  co-infection with other pathogens not detected by the test. Clinical correlation with patient history and other diagnostic information is necessary to determine patient infection status. The expected result is Negative.  Fact Sheet for Patients: EntrepreneurPulse.com.au  Fact Sheet for Healthcare Providers: IncredibleEmployment.be  This test is not yet approved or cleared by the [place] FDA and  has been authorized for detection and/or diagnosis of SARS-CoV-2 by FDA under an Emergency Use Authorization (EUA).  This EUA will remain in effect (meaning this test can be used)  for the duration of  the COVID-19 declaration under Section 564(b)(1) of the Act, 21 U.S.C. section 360bbb-3(b)(1), unless the authorization is terminated or revoked sooner.     Influenza A by PCR NEGATIVE NEGATIVE Final   Influenza B by PCR NEGATIVE NEGATIVE Final    Comment: (NOTE) The Xpert Xpress SARS-CoV-2/FLU/RSV plus assay is intended as an aid in the diagnosis of influenza from Nasopharyngeal swab specimens and should not be used as a sole basis for treatment. Nasal washings and aspirates are unacceptable for Xpert Xpress SARS-CoV-2/FLU/RSV testing.  Fact Sheet for Patients: EntrepreneurPulse.com.au  Fact Sheet for Healthcare Providers: IncredibleEmployment.be  This test is not yet approved or cleared by the [place] FDA and has been authorized for detection and/or diagnosis of SARS-CoV-2 by FDA under an Emergency Use Authorization (EUA). This EUA will remain in effect (meaning this test can be used) for the duration of the COVID-19 declaration under Section 564(b)(1) of the Act, 21 U.S.C. section 360bbb-3(b)(1), unless the authorization is terminated or revoked.  Performed at [facility], [place] [address] [place], [place] [address]   Blood Culture (routine x 2)     Status: None   Collection Time: 07/29/20   7:31 AM   Specimen: BLOOD RIGHT HAND  Result Value Ref Range Status   Specimen Description BLOOD RIGHT HAND  Final   Special Requests   Final    BOTTLES DRAWN AEROBIC ONLY Blood Culture results may not be optimal due to an inadequate volume of blood received in culture bottles   Culture   Final    NO GROWTH 5 DAYS Performed at [facility], [place] [address], [place], [place] [address]    Report Status 08/03/2020 FINAL  Final  Blood Culture (routine x 2)     Status: None   Collection Time: 07/29/20  7:31 AM   Specimen: BLOOD  Result Value Ref Range Status   Specimen Description BLOOD LEFT ANTECUBITAL  Final   Special Requests   Final    BOTTLES DRAWN AEROBIC AND ANAEROBIC Blood Culture adequate volume   Culture   Final    NO GROWTH 5 DAYS Performed at [facility], [place] [address] [place], [place] [address]  Report Status 08/03/2020 FINAL  Final  MRSA PCR Screening     Status: None   Collection Time: 07/29/20  2:43 PM   Specimen: Nasopharyngeal  Result Value Ref Range Status   MRSA by PCR NEGATIVE NEGATIVE Final    Comment:        The GeneXpert MRSA Assay (FDA approved for NASAL specimens only), is one component of a comprehensive MRSA colonization surveillance program. It is not intended to diagnose MRSA infection nor to guide or monitor treatment for MRSA infections. Performed at [facility], [place] [address] [place], [place] [address]     Radiology Reports DG CHEST PORT 1 VIEW  Result Date: 07/31/2020 CLINICAL DATA:  Hypoxia. EXAM: PORTABLE CHEST 1 VIEW COMPARISON:  07/29/2020. FINDINGS: Mediastinum hilar structures normal. Heart size normal. Prominent bibasilar infiltrates consistent with bibasilar pneumonia and or edema noted on today's exam. Tiny bilateral pleural effusions. No pneumothorax. IMPRESSION: Prominent bibasilar infiltrates consistent with bibasilar pneumonia and or edema noted on today's exam. Tiny bilateral pleural effusions.  Electronically Signed   By: Marcello Moores  Register   On: 07/31/2020 05:52   Portable chest x-ray (1 view)  Result Date: 07/29/2020 CLINICAL DATA:  Shortness of breath, hypoxia EXAM: PORTABLE CHEST 1 VIEW COMPARISON:  07/28/2020 FINDINGS: Mild left lower lobe opacity, suspicious for pneumonia. Mild right basilar opacity, possibly atelectasis. No frank interstitial edema. No pleural effusion or pneumothorax. IMPRESSION: Mild left lower lobe opacity, suspicious for pneumonia. Electronically Signed   By: Julian Hy M.D.   On: 07/29/2020 13:14   DG Chest Portable 1 View  Result Date: 07/28/2020 CLINICAL DATA:  Shortness of breath EXAM: PORTABLE CHEST 1 VIEW COMPARISON:  11/10/2019 FINDINGS: Heart and mediastinal contours are within normal limits. No focal opacities or effusions. No acute bony abnormality. IMPRESSION: No active disease. Electronically Signed   By: Rolm Baptise M.D.   On: 07/28/2020 19:40   DG Abd Portable 1V  Result Date: 08/03/2020 CLINICAL DATA:  Abdominal distension. EXAM: PORTABLE ABDOMEN - 1 VIEW COMPARISON:  07/29/2020 FINDINGS: Bowel gas pattern is nonobstructive. No free peritoneal air. Minimal degenerate change of the spine and hips. IMPRESSION: Nonobstructive bowel gas pattern. Electronically Signed   By: Marin Olp M.D.   On: 08/03/2020 16:01   DG Abd Portable 1V  Result Date: 07/29/2020 CLINICAL DATA:  Attempt at OG tube. EXAM: PORTABLE ABDOMEN - 1 VIEW COMPARISON:  Radiograph earlier today. FINDINGS: The enteric tube projects over the midline of the chest, tip at the level of the carina. Progressive bibasilar opacities since chest radiograph earlier today. IMPRESSION: 1. Enteric tube projecting over the midline of the chest, tip at the level of the carina. Recommend repositioning. 2. Progressive bibasilar opacities since chest radiograph earlier today, likely COVID pneumonia. Electronically Signed   By: Keith Rake M.D.   On: 07/29/2020 22:04   VAS [place] LOWER EXTREMITY  VENOUS (DVT)  Result Date: 07/29/2020  Lower Venous DVT Study Other Indications: Covid+ hypoxia. Performing Technologist: Rogelia Rohrer  Examination Guidelines: A complete evaluation includes B-mode imaging, spectral Doppler, color Doppler, and power Doppler as needed of all accessible portions of each vessel. Bilateral testing is considered an integral part of a complete examination. Limited examinations for reoccurring indications may be performed as noted. The reflux portion of the exam is performed with the patient in reverse Trendelenburg.  +---------+---------------+---------+-----------+----------+--------------+ RIGHT    CompressibilityPhasicitySpontaneityPropertiesThrombus Aging +---------+---------------+---------+-----------+----------+--------------+ CFV      Full           Yes  Yes                                 +---------+---------------+---------+-----------+----------+--------------+ SFJ      Full                                                        +---------+---------------+---------+-----------+----------+--------------+ FV Prox  Full           Yes      Yes                                 +---------+---------------+---------+-----------+----------+--------------+ FV Mid   Full           Yes      Yes                                 +---------+---------------+---------+-----------+----------+--------------+ FV DistalFull           Yes      Yes                                 +---------+---------------+---------+-----------+----------+--------------+ PFV      Full                                                        +---------+---------------+---------+-----------+----------+--------------+ POP      Full           Yes      Yes                                 +---------+---------------+---------+-----------+----------+--------------+ PTV      Full                                                         +---------+---------------+---------+-----------+----------+--------------+ PERO     Full                                                        +---------+---------------+---------+-----------+----------+--------------+   +---------+---------------+---------+-----------+----------+--------------+ LEFT     CompressibilityPhasicitySpontaneityPropertiesThrombus Aging +---------+---------------+---------+-----------+----------+--------------+ CFV      Full           Yes      Yes                                 +---------+---------------+---------+-----------+----------+--------------+ SFJ      Full                                                        +---------+---------------+---------+-----------+----------+--------------+  FV Prox  Full           Yes      Yes                                 +---------+---------------+---------+-----------+----------+--------------+ FV Mid   Full           Yes      Yes                                 +---------+---------------+---------+-----------+----------+--------------+ FV DistalFull           Yes      Yes                                 +---------+---------------+---------+-----------+----------+--------------+ PFV      Full                                                        +---------+---------------+---------+-----------+----------+--------------+ POP      Full           Yes      Yes                                 +---------+---------------+---------+-----------+----------+--------------+ PTV      Full                                                        +---------+---------------+---------+-----------+----------+--------------+ PERO     Full                                                        +---------+---------------+---------+-----------+----------+--------------+     Summary: BILATERAL: - No evidence of deep vein thrombosis seen in the lower extremities, bilaterally. - RIGHT: - No  cystic structure found in the popliteal fossa.  LEFT: - No cystic structure found in the popliteal fossa.  *See table(s) above for measurements and observations. Electronically signed by Jamelle Haring on 07/29/2020 at 7:25:56 PM.    Final    ECHOCARDIOGRAM LIMITED  Result Date: 07/29/2020    ECHOCARDIOGRAM LIMITED REPORT   Patient Name:   Lehman Prom Date of Exam: 07/29/2020 Medical Rec #:  [id number]     Height:       66.0 in Accession #:    [id number]    Weight:       123.9 lb Date of Birth:  12/09/1960     BSA:          1.632 m Patient Age:    16 years      BP:           137/77 mmHg Patient Gender: F             HR:  155 bpm. Exam Location:  Inpatient Procedure: Limited Echo and Cardiac Doppler Indications:    R06.02 SOB  History:        Patient has prior history of Echocardiogram examinations, most                 recent 07/01/2019. Abnormal ECG, Signs/Symptoms:Syncope; Risk                 Factors:Diabetes. Covid positive.  Sonographer:    Roseanna Rainbow RDCS Referring Phys: [id number] [facility]  Sonographer Comments: Heart rate is very high despite meds. IMPRESSIONS  1. Left ventricular ejection fraction, by estimation, is 65 to 70%. The left ventricle has normal function. The left ventricle has no regional wall motion abnormalities.  2. The mitral valve is normal in structure.  3. The aortic valve is normal in structure.  4. There is mildly elevated pulmonary artery systolic pressure. The estimated right ventricular systolic pressure is 96.0 mmHg.  5. The inferior vena cava is normal in size with <50% respiratory variability, suggesting right atrial pressure of 8 mmHg. FINDINGS  Left Ventricle: Left ventricular ejection fraction, by estimation, is 65 to 70%. The left ventricle has normal function. The left ventricle has no regional wall motion abnormalities. Right Ventricle: There is mildly elevated pulmonary artery systolic pressure. The tricuspid regurgitant velocity is 2.66 m/s, and with an assumed  right atrial pressure of 8 mmHg, the estimated right ventricular systolic pressure is 45.4 mmHg. Mitral Valve: The mitral valve is normal in structure. Tricuspid Valve: The tricuspid valve is normal in structure. Tricuspid valve regurgitation is mild. Aortic Valve: The aortic valve is normal in structure. Venous: The inferior vena cava is normal in size with less than 50% respiratory variability, suggesting right atrial pressure of 8 mmHg. IAS/Shunts: The interatrial septum appears to be lipomatous. LEFT VENTRICLE PLAX 2D LVIDd:         3.10 cm LVIDs:         1.70 cm LV PW:         0.90 cm LV IVS:        1.00 cm LVOT diam:     1.60 cm LVOT Area:     2.01 cm  LV Volumes (MOD) LV vol d, MOD A2C: 47.2 ml LV vol d, MOD A4C: 40.2 ml LV vol s, MOD A2C: 14.6 ml LV vol s, MOD A4C: 16.4 ml LV SV MOD A2C:     32.6 ml LV SV MOD A4C:     40.2 ml LV SV MOD BP:      29.4 ml IVC IVC diam: 2.00 cm LEFT ATRIUM         Index LA diam:    2.30 cm 1.41 cm/m   AORTA Ao Root diam: 2.60 cm TRICUSPID VALVE TR Peak grad:   28.3 mmHg TR Vmax:        266.00 cm/s  SHUNTS Systemic Diam: 1.60 cm Fransico Him MD Electronically signed by Fransico Him MD Signature Date/Time: 07/29/2020/5:07:56 PM    Final

## 2020-08-07 NOTE — Progress Notes (Signed)
CSW reached out to Financial Counseling to see if patient would qualify for a Medicaid application.   Germain Koopmann LCSW

## 2020-08-07 NOTE — Progress Notes (Signed)
Nutrition Follow-up  DOCUMENTATION CODES:   Not applicable  INTERVENTION:  Continue Ensure Enlive po BID, each supplement provides 350 kcal and 20 grams of protein.  Encourage adequate PO intake.   NUTRITION DIAGNOSIS:   Inadequate oral intake related to acute illness as evidenced by NPO status; diet advanced; progressing  GOAL:   Patient will meet greater than or equal to 90% of their needs; progressing  MONITOR:   PO intake,Supplement acceptance,Labs,Weight trends  REASON FOR ASSESSMENT:   Malnutrition Screening Tool    ASSESSMENT:   60 yo female admitted with DKA, COVID+ . PMH DM, chronic pain syndrome, HTN  Meal completion has been 50-60%. Pt with diarrhea likely related to antibiotics per MD. Pt currently has Ensure ordered and has been consuming them. RD to continue with current orders to aid in caloric and protein needs. Labs and medications reviewed.   Diet Order:   Diet Order            Diet Carb Modified Fluid consistency: Thin; Room service appropriate? Yes  Diet effective now                 EDUCATION NEEDS:   Not appropriate for education at this time  Skin:  Skin Assessment: Reviewed RN Assessment  Last BM:  1/19  Height:   Ht Readings from Last 1 Encounters:  07/29/20 5\' 6"  (1.676 m)    Weight:   Wt Readings from Last 1 Encounters:  08/05/20 61.6 kg    BMI:  Body mass index is 21.92 kg/m.  Estimated Nutritional Needs:   Kcal:  1800-2000 kcals  Protein:  90-100 g  Fluid:  >/= 1.8 L  [name] Parker, MS, RD, LDN RD pager number/after hours weekend pager number on Amion.

## 2020-08-08 LAB — BASIC METABOLIC PANEL
Anion gap: 9 (ref 5–15)
BUN: 13 mg/dL (ref 6–20)
CO2: 27 mmol/L (ref 22–32)
Calcium: 8.4 mg/dL — ABNORMAL LOW (ref 8.9–10.3)
Chloride: 101 mmol/L (ref 98–111)
Creatinine, Ser: 0.73 mg/dL (ref 0.44–1.00)
GFR, Estimated: >60 mL/min (ref >60)
Glucose, Bld: 301 mg/dL — ABNORMAL HIGH (ref 70–99)
Potassium: 4.2 mmol/L (ref 3.5–5.1)
Sodium: 137 mmol/L (ref 135–145)

## 2020-08-08 LAB — CBC
HCT: 25.7 % — ABNORMAL LOW (ref 36.0–46.0)
Hemoglobin: 8.9 g/dL — ABNORMAL LOW (ref 12.0–15.0)
MCH: 33.2 pg (ref 26.0–34.0)
MCHC: 34.6 g/dL (ref 30.0–36.0)
MCV: 95.9 fL (ref 80.0–100.0)
Platelets: 351 10*3/uL (ref 150–400)
RBC: 2.68 MIL/uL — ABNORMAL LOW (ref 3.87–5.11)
RDW: 13.9 % (ref 11.5–15.5)
WBC: 14 10*3/uL — ABNORMAL HIGH (ref 4.0–10.5)
nRBC: 0 % (ref 0.0–0.2)

## 2020-08-08 LAB — GLUCOSE, CAPILLARY
Glucose-Capillary: 269 mg/dL — ABNORMAL HIGH (ref 70–99)
Glucose-Capillary: 290 mg/dL — ABNORMAL HIGH (ref 70–99)
Glucose-Capillary: 335 mg/dL — ABNORMAL HIGH (ref 70–99)
Glucose-Capillary: 206 mg/dL — ABNORMAL HIGH (ref 70–99)

## 2020-08-08 LAB — MAGNESIUM: Magnesium: 2.1 mg/dL (ref 1.7–2.4)

## 2020-08-08 MED ORDER — INSULIN DETEMIR 100 UNIT/ML ~~LOC~~ SOLN
35.0000 [IU] | Freq: Every day | SUBCUTANEOUS | Status: DC
  Administered 2020-08-08: 35 [IU] via SUBCUTANEOUS
  Filled 2020-08-08 (×2): qty 0.35

## 2020-08-08 MED ORDER — INSULIN ASPART 100 UNIT/ML ~~LOC~~ SOLN
0.0000 [IU] | Freq: Three times a day (TID) | SUBCUTANEOUS | Status: DC
  Administered 2020-08-08: 8 [IU] via SUBCUTANEOUS
  Administered 2020-08-08: 11 [IU] via SUBCUTANEOUS
  Administered 2020-08-09: 5 [IU] via SUBCUTANEOUS
  Administered 2020-08-09 – 2020-08-10 (×2): 3 [IU] via SUBCUTANEOUS
  Administered 2020-08-10 – 2020-08-11 (×2): 5 [IU] via SUBCUTANEOUS
  Administered 2020-08-11: 3 [IU] via SUBCUTANEOUS
  Administered 2020-08-12: 5 [IU] via SUBCUTANEOUS
  Administered 2020-08-12: 2 [IU] via SUBCUTANEOUS
  Administered 2020-08-12: 3 [IU] via SUBCUTANEOUS

## 2020-08-08 MED ORDER — INSULIN ASPART 100 UNIT/ML ~~LOC~~ SOLN
4.0000 [IU] | Freq: Three times a day (TID) | SUBCUTANEOUS | Status: DC
  Administered 2020-08-08: 4 [IU] via SUBCUTANEOUS

## 2020-08-08 MED ORDER — INSULIN ASPART 100 UNIT/ML ~~LOC~~ SOLN
8.0000 [IU] | Freq: Three times a day (TID) | SUBCUTANEOUS | Status: DC
  Administered 2020-08-08 – 2020-08-10 (×7): 8 [IU] via SUBCUTANEOUS

## 2020-08-08 MED ORDER — FAMOTIDINE 20 MG PO TABS
20.0000 mg | ORAL_TABLET | Freq: Every day | ORAL | Status: DC
  Administered 2020-08-09 – 2020-08-12 (×4): 20 mg via ORAL
  Filled 2020-08-08 (×4): qty 1

## 2020-08-08 NOTE — Progress Notes (Signed)
PROGRESS NOTE                                                                                                                                                                                                             Patient Demographics:    [name] [name], is a 60 y.o. female, DOB - 1960-10-22, [id number]  Outpatient Primary MD for the patient is Antony Blackbird, MD (Inactive)   Admit date - 07/28/2020   LOS - 10  Chief Complaint  Patient presents with  . Shortness of Breath       Brief Narrative: Patient is a 60 y.o. female with PMHx of DM-2, HTN, adrenal insufficiency, peripheral neuropathy, chronic back pain admitted by PCCM to the ICU for DKA and COVID-19 pneumonia.  Upon stability-transfer to the [facility].   COVID-19 vaccinated status:   Significant Events: 1/9>> Admit to ICU for DKA/hypoxia due to COVID-19 pneumonia 1/13>> transfer to [facility]  Significant studies: 1/9>> chest x-ray: No active disease 1/10>> bilateral lower extremity Doppler: No DVT. 1/10>> Echo: EF 65-70%. 1/10>> chest x-ray: Mild left lower lobe opacity 1/10>> portable abdomen x-ray: Enteric tube projecting over the midline, progressive bibasilar opacities. 1/12>> chest x-ray: Bibasilar pneumonia 1/15>> x-ray abdomen portable: Nonobstructive bowel gas pattern.  COVID-19 medications: Steroids: 1/10>> Baricitinib: 1/11>> 1/13  Antibiotics: Rocephin: 1/10 >> 1/14  Zithromax: 1/10 >> 1/14   Microbiology data: 1/10 >>blood culture: No growth  Procedures: None  Consults: PCCM  DVT prophylaxis: enoxaparin (LOVENOX) injection 40 mg Start: 07/30/20 1230    Subjective:   Claims that diarrhea is back-she had around 4-5 episodes yesterday.  2 episodes this morning-claims it was loose and watery.   Assessment  & Plan :   Acute Hypoxic Resp Failure due to Covid 19 Viral pneumonia: On room air this morning-continue  low-dose prednisone for adrenal insufficiency.    Fever: afebrile O2 requirements:  SpO2: 90 % O2 Flow Rate (L/min): 3 L/min FiO2 (%): (S) 40 %   COVID-19 Labs: No results for input(s): DDIMER, FERRITIN, LDH, CRP in the last 72 hours.  No results found for: BNP  Recent Labs  Lab 08/02/20 0809 08/03/20 0311  PROCALCITON 0.79 0.67    Lab Results  Component Value Date   SARSCOV2NAA POSITIVE (A) 07/28/2020   [place] NEGATIVE 02/25/2020   [place] NEGATIVE 12/08/2019   SARSCOV2NAA  NEGATIVE 09/06/2019     Prone/Incentive Spirometry: encouraged  incentive spirometry use 3-4/hour.  DKA: Resolved-required insulin infusion on admission.  Insulin-dependent DM-2 (A1c 12.3 on 1/10): CBGs remain uncontrolled-increase Levemir to 35 units daily, increase Premeal NovoLog to 8 units-continue SSI-follow and adjust.    Recent Labs    08/07/20 2142 08/08/20 0828 08/08/20 1252  GLUCAP 338* 269* 290*   Orthostatic hypotension: Likely due to combination of underlying adrenal insufficiency and diarrhea.  Continue Florinef and steroids-Place thigh-high TED hose.  Recheck orthostatic vital signs tomorrow.    History of adrenal insufficiency: Apparently just on Florinef in the outpatient setting-given ongoing issues with orthostatic hypotension-have elected to continue on low-dose prednisone for now.    Diarrhea: Seems to be waxing and waning-some improvement yesterday morning only to recur later during the day-we will continue to observe for the next day or so-use as needed Imodium-if diarrhea continues-May need to order stool studies-patient did get antibiotics during the earlier part of this hospital stay.    Peripheral neuropathy: Continue Cymbalta/gabapentin  Deconditioning/generalized weakness: Secondary to acute illness-PT recommending SNF-social work following.  GI prophylaxis: PPI  ABG:    Component Value Date/Time   HCO3 13.4 (L) 07/29/2020 0917   TCO2 14 (L) 07/29/2020  0917   ACIDBASEDEF 11.0 (H) 07/29/2020 0917   O2SAT 87.0 07/29/2020 0917    Vent Settings: N/A  Condition - Stable  Family Communication  :  Daughter-[phone] Left voicemail on 1/19  Code Status :  Full Code  Diet :  Diet Order            Diet Carb Modified Fluid consistency: Thin; Room service appropriate? Yes  Diet effective now                  Disposition Plan  :   Status is: Inpatient  Remains inpatient appropriate because:Inpatient level of care appropriate due to severity of illness   Dispo:  Patient From: Home  Planned Disposition: SNF  Expected discharge date: 08/11/2020  Medically stable for discharge: Yes   Barriers to discharge: Diarrhea/orthostatic hypotension-needs SNF.  Antimicorbials  :    Anti-infectives (From admission, onward)   Start     Dose/Rate Route Frequency Ordered Stop   07/30/20 0015  azithromycin (ZITHROMAX) 500 mg in sodium chloride 0.9 % 250 mL IVPB  Status:  Discontinued        500 mg 250 mL/hr over 60 Minutes Intravenous Every 24 hours 07/29/20 2326 08/03/20 0756   07/29/20 1600  cefTRIAXone (ROCEPHIN) 2 g in sodium chloride 0.9 % 100 mL IVPB        2 g 200 mL/hr over 30 Minutes Intravenous Every 24 hours 07/29/20 1504 08/03/20 1000   07/29/20 1600  doxycycline (VIBRA-TABS) tablet 100 mg  Status:  Discontinued        100 mg Oral Every 12 hours 07/29/20 1504 07/29/20 2326      Inpatient Medications  Scheduled Meds: . dextromethorphan-guaiFENesin  1 tablet Oral BID  . DULoxetine  60 mg Oral BID  . enoxaparin (LOVENOX) injection  40 mg Subcutaneous Q24H  . feeding supplement  237 mL Oral BID BM  . fludrocortisone  0.1 mg Oral Daily  . gabapentin  600 mg Oral TID  . insulin aspart  0-15 Units Subcutaneous TID WC  . insulin aspart  0-5 Units Subcutaneous QHS  . insulin aspart  4 Units Subcutaneous TID WC  . insulin detemir  30 Units Subcutaneous QHS  . linagliptin  5 mg Oral  Daily  . pantoprazole  40 mg Oral  Daily  . predniSONE  20 mg Oral Q breakfast  . saccharomyces boulardii  250 mg Oral BID  . traZODone  50 mg Oral QHS   Continuous Infusions: . lactated ringers 10 mL/hr at 08/07/20 1800   PRN Meds:.acetaminophen, albuterol, alum & mag hydroxide-simeth, dextrose, loperamide, methocarbamol, metoprolol tartrate, ondansetron (ZOFRAN) IV, promethazine   Time Spent in minutes  25  See all Orders from today for further details   Oren Binet M.D on 08/08/2020 at 3:18 PM  To page go to [email or web address] - use universal password  [facility] -  Office  [phone]    Objective:   Vitals:   08/07/20 1653 08/07/20 2147 08/08/20 0536 08/08/20 0848  BP:  124/67 136/64   Pulse: (!) 109 (!) 102 (!) 109   Resp: 20 20 19    Temp:  98.1 F (36.7 C) 98.3 F (36.8 C)   TempSrc: Oral Axillary Axillary   SpO2: 92% 91% 91% 90%  Weight:      Height:        Wt Readings from Last 3 Encounters:  08/05/20 61.6 kg  06/02/20 63.5 kg  04/20/20 63.5 kg     Intake/Output Summary (Last 24 hours) at 08/08/2020 1518 Last data filed at 08/08/2020 0858 Gross per 24 hour  Intake 612.95 ml  Output -  Net 612.95 ml     Physical Exam Gen Exam:Alert awake-not in any distress HEENT:atraumatic, normocephalic Chest: B/L clear to auscultation anteriorly CVS:S1S2 regular Abdomen:soft non tender, non distended Extremities:no edema Neurology: Non focal Skin: no rash   Data Review:    CBC Recent Labs  Lab 08/02/20 0809 08/06/20 0106 08/06/20 0757 08/07/20 0233 08/08/20 0135  WBC 12.5* 15.0* 17.6* 12.9* 14.0*  HGB 11.6* 9.6* 10.6* 9.5* 8.9*  HCT 32.8* 25.6* 29.1* 27.5* 25.7*  PLT 201 305 424* 340 351  MCV 93.7 95.2 93.9 95.8 95.9  MCH 33.1 35.7* 34.2* 33.1 33.2  MCHC 35.4 37.5* 36.4* 34.5 34.6  RDW 13.4 14.6 13.9 13.6 13.9  LYMPHSABS 1.3 1.0 1.9  --   --   MONOABS 1.2* 0.7 0.8  --   --   EOSABS 0.0 0.0 0.1  --   --   BASOSABS 0.1 0.0 0.0  --   --     Chemistries  Recent Labs   Lab 08/02/20 0809 08/06/20 0106 08/07/20 0233 08/08/20 0135  NA 139 132* 136 137  K 4.0 4.3 4.3 4.2  CL 106 98 102 101  CO2 25 25 27 27   GLUCOSE 80 275* 230* 301*  BUN 17 16 10 13   CREATININE 0.56 0.60 0.54 0.73  CALCIUM 8.3* 8.0* 8.0* 8.4*  MG  --  1.8 1.9 2.1  AST 71* 46*  --   --   ALT 72* 77*  --   --   ALKPHOS 174* 200*  --   --   BILITOT 0.8 0.4  --   --    ------------------------------------------------------------------------------------------------------------------ No results for input(s): CHOL, HDL, LDLCALC, TRIG, CHOLHDL, LDLDIRECT in the last 72 hours.  Lab Results  Component Value Date   HGBA1C 12.3 (H) 07/29/2020   ------------------------------------------------------------------------------------------------------------------ No results for input(s): TSH, T4TOTAL, T3FREE, THYROIDAB in the last 72 hours.  Invalid input(s): FREET3 ------------------------------------------------------------------------------------------------------------------ No results for input(s): VITAMINB12, FOLATE, FERRITIN, TIBC, IRON, RETICCTPCT in the last 72 hours.  Coagulation profile No results for input(s): INR, PROTIME in the last 168 hours.  No results for input(s): DDIMER in the last  72 hours.  Cardiac Enzymes No results for input(s): CKMB, TROPONINI, MYOGLOBIN in the last 168 hours.  Invalid input(s): CK ------------------------------------------------------------------------------------------------------------------ No results found for: BNP  Micro Results No results found for this or any previous visit (from the past 240 hour(s)).  Radiology Reports DG CHEST PORT 1 VIEW  Result Date: 07/31/2020 CLINICAL DATA:  Hypoxia. EXAM: PORTABLE CHEST 1 VIEW COMPARISON:  07/29/2020. FINDINGS: Mediastinum hilar structures normal. Heart size normal. Prominent bibasilar infiltrates consistent with bibasilar pneumonia and or edema noted on today's exam. Tiny bilateral pleural  effusions. No pneumothorax. IMPRESSION: Prominent bibasilar infiltrates consistent with bibasilar pneumonia and or edema noted on today's exam. Tiny bilateral pleural effusions. Electronically Signed   By: Marcello Moores  Register   On: 07/31/2020 05:52   Portable chest x-ray (1 view)  Result Date: 07/29/2020 CLINICAL DATA:  Shortness of breath, hypoxia EXAM: PORTABLE CHEST 1 VIEW COMPARISON:  07/28/2020 FINDINGS: Mild left lower lobe opacity, suspicious for pneumonia. Mild right basilar opacity, possibly atelectasis. No frank interstitial edema. No pleural effusion or pneumothorax. IMPRESSION: Mild left lower lobe opacity, suspicious for pneumonia. Electronically Signed   By: Julian Hy M.D.   On: 07/29/2020 13:14   DG Chest Portable 1 View  Result Date: 07/28/2020 CLINICAL DATA:  Shortness of breath EXAM: PORTABLE CHEST 1 VIEW COMPARISON:  11/10/2019 FINDINGS: Heart and mediastinal contours are within normal limits. No focal opacities or effusions. No acute bony abnormality. IMPRESSION: No active disease. Electronically Signed   By: Rolm Baptise M.D.   On: 07/28/2020 19:40   DG Abd Portable 1V  Result Date: 08/03/2020 CLINICAL DATA:  Abdominal distension. EXAM: PORTABLE ABDOMEN - 1 VIEW COMPARISON:  07/29/2020 FINDINGS: Bowel gas pattern is nonobstructive. No free peritoneal air. Minimal degenerate change of the spine and hips. IMPRESSION: Nonobstructive bowel gas pattern. Electronically Signed   By: Marin Olp M.D.   On: 08/03/2020 16:01   DG Abd Portable 1V  Result Date: 07/29/2020 CLINICAL DATA:  Attempt at OG tube. EXAM: PORTABLE ABDOMEN - 1 VIEW COMPARISON:  Radiograph earlier today. FINDINGS: The enteric tube projects over the midline of the chest, tip at the level of the carina. Progressive bibasilar opacities since chest radiograph earlier today. IMPRESSION: 1. Enteric tube projecting over the midline of the chest, tip at the level of the carina. Recommend repositioning. 2. Progressive  bibasilar opacities since chest radiograph earlier today, likely COVID pneumonia. Electronically Signed   By: Keith Rake M.D.   On: 07/29/2020 22:04   VAS [place] LOWER EXTREMITY VENOUS (DVT)  Result Date: 07/29/2020  Lower Venous DVT Study Other Indications: Covid+ hypoxia. Performing Technologist: Rogelia Rohrer  Examination Guidelines: A complete evaluation includes B-mode imaging, spectral Doppler, color Doppler, and power Doppler as needed of all accessible portions of each vessel. Bilateral testing is considered an integral part of a complete examination. Limited examinations for reoccurring indications may be performed as noted. The reflux portion of the exam is performed with the patient in reverse Trendelenburg.  +---------+---------------+---------+-----------+----------+--------------+ RIGHT    CompressibilityPhasicitySpontaneityPropertiesThrombus Aging +---------+---------------+---------+-----------+----------+--------------+ CFV      Full           Yes      Yes                                 +---------+---------------+---------+-----------+----------+--------------+ SFJ      Full                                                        +---------+---------------+---------+-----------+----------+--------------+  FV Prox  Full           Yes      Yes                                 +---------+---------------+---------+-----------+----------+--------------+ FV Mid   Full           Yes      Yes                                 +---------+---------------+---------+-----------+----------+--------------+ FV DistalFull           Yes      Yes                                 +---------+---------------+---------+-----------+----------+--------------+ PFV      Full                                                        +---------+---------------+---------+-----------+----------+--------------+ POP      Full           Yes      Yes                                  +---------+---------------+---------+-----------+----------+--------------+ PTV      Full                                                        +---------+---------------+---------+-----------+----------+--------------+ PERO     Full                                                        +---------+---------------+---------+-----------+----------+--------------+   +---------+---------------+---------+-----------+----------+--------------+ LEFT     CompressibilityPhasicitySpontaneityPropertiesThrombus Aging +---------+---------------+---------+-----------+----------+--------------+ CFV      Full           Yes      Yes                                 +---------+---------------+---------+-----------+----------+--------------+ SFJ      Full                                                        +---------+---------------+---------+-----------+----------+--------------+ FV Prox  Full           Yes      Yes                                 +---------+---------------+---------+-----------+----------+--------------+ FV Mid   Full  Yes      Yes                                 +---------+---------------+---------+-----------+----------+--------------+ FV DistalFull           Yes      Yes                                 +---------+---------------+---------+-----------+----------+--------------+ PFV      Full                                                        +---------+---------------+---------+-----------+----------+--------------+ POP      Full           Yes      Yes                                 +---------+---------------+---------+-----------+----------+--------------+ PTV      Full                                                        +---------+---------------+---------+-----------+----------+--------------+ PERO     Full                                                         +---------+---------------+---------+-----------+----------+--------------+     Summary: BILATERAL: - No evidence of deep vein thrombosis seen in the lower extremities, bilaterally. - RIGHT: - No cystic structure found in the popliteal fossa.  LEFT: - No cystic structure found in the popliteal fossa.  *See table(s) above for measurements and observations. Electronically signed by Jamelle Haring on 07/29/2020 at 7:25:56 PM.    Final    ECHOCARDIOGRAM LIMITED  Result Date: 07/29/2020    ECHOCARDIOGRAM LIMITED REPORT   Patient Name:   Lehman Prom Date of Exam: 07/29/2020 Medical Rec #:  [id number]     Height:       66.0 in Accession #:    [id number]    Weight:       123.9 lb Date of Birth:  1961/07/12     BSA:          1.632 m Patient Age:    17 years      BP:           137/77 mmHg Patient Gender: F             HR:           155 bpm. Exam Location:  Inpatient Procedure: Limited Echo and Cardiac Doppler Indications:    R06.02 SOB  History:        Patient has prior history of Echocardiogram examinations, most                 recent 07/01/2019. Abnormal ECG, Signs/Symptoms:Syncope; Risk  Factors:Diabetes. Covid positive.  Sonographer:    Roseanna Rainbow RDCS Referring Phys: [id number] Silvestre Moment [facility]  Sonographer Comments: Heart rate is very high despite meds. IMPRESSIONS  1. Left ventricular ejection fraction, by estimation, is 65 to 70%. The left ventricle has normal function. The left ventricle has no regional wall motion abnormalities.  2. The mitral valve is normal in structure.  3. The aortic valve is normal in structure.  4. There is mildly elevated pulmonary artery systolic pressure. The estimated right ventricular systolic pressure is Q000111Q mmHg.  5. The inferior vena cava is normal in size with <50% respiratory variability, suggesting right atrial pressure of 8 mmHg. FINDINGS  Left Ventricle: Left ventricular ejection fraction, by estimation, is 65 to 70%. The left ventricle has normal function. The left  ventricle has no regional wall motion abnormalities. Right Ventricle: There is mildly elevated pulmonary artery systolic pressure. The tricuspid regurgitant velocity is 2.66 m/s, and with an assumed right atrial pressure of 8 mmHg, the estimated right ventricular systolic pressure is Q000111Q mmHg. Mitral Valve: The mitral valve is normal in structure. Tricuspid Valve: The tricuspid valve is normal in structure. Tricuspid valve regurgitation is mild. Aortic Valve: The aortic valve is normal in structure. Venous: The inferior vena cava is normal in size with less than 50% respiratory variability, suggesting right atrial pressure of 8 mmHg. IAS/Shunts: The interatrial septum appears to be lipomatous. LEFT VENTRICLE PLAX 2D LVIDd:         3.10 cm LVIDs:         1.70 cm LV PW:         0.90 cm LV IVS:        1.00 cm LVOT diam:     1.60 cm LVOT Area:     2.01 cm  LV Volumes (MOD) LV vol d, MOD A2C: 47.2 ml LV vol d, MOD A4C: 40.2 ml LV vol s, MOD A2C: 14.6 ml LV vol s, MOD A4C: 16.4 ml LV SV MOD A2C:     32.6 ml LV SV MOD A4C:     40.2 ml LV SV MOD BP:      29.4 ml IVC IVC diam: 2.00 cm LEFT ATRIUM         Index LA diam:    2.30 cm 1.41 cm/m   AORTA Ao Root diam: 2.60 cm TRICUSPID VALVE TR Peak grad:   28.3 mmHg TR Vmax:        266.00 cm/s  SHUNTS Systemic Diam: 1.60 cm Fransico Him MD Electronically signed by Fransico Him MD Signature Date/Time: 07/29/2020/5:07:56 PM    Final

## 2020-08-09 DIAGNOSIS — U071 COVID-19: Principal | ICD-10-CM

## 2020-08-09 DIAGNOSIS — R0902 Hypoxemia: Secondary | ICD-10-CM

## 2020-08-09 LAB — CBC
HCT: 27.5 % — ABNORMAL LOW (ref 36.0–46.0)
Hemoglobin: 8.9 g/dL — ABNORMAL LOW (ref 12.0–15.0)
MCH: 31.8 pg (ref 26.0–34.0)
MCHC: 32.4 g/dL (ref 30.0–36.0)
MCV: 98.2 fL (ref 80.0–100.0)
Platelets: 337 10*3/uL (ref 150–400)
RBC: 2.8 MIL/uL — ABNORMAL LOW (ref 3.87–5.11)
RDW: 14.1 % (ref 11.5–15.5)
WBC: 12.6 10*3/uL — ABNORMAL HIGH (ref 4.0–10.5)
nRBC: 0 % (ref 0.0–0.2)

## 2020-08-09 LAB — BASIC METABOLIC PANEL
Anion gap: 9 (ref 5–15)
BUN: 13 mg/dL (ref 6–20)
CO2: 27 mmol/L (ref 22–32)
Calcium: 8.3 mg/dL — ABNORMAL LOW (ref 8.9–10.3)
Chloride: 101 mmol/L (ref 98–111)
Creatinine, Ser: 0.47 mg/dL (ref 0.44–1.00)
GFR, Estimated: >60 mL/min (ref >60)
Glucose, Bld: 180 mg/dL — ABNORMAL HIGH (ref 70–99)
Potassium: 4.1 mmol/L (ref 3.5–5.1)
Sodium: 137 mmol/L (ref 135–145)

## 2020-08-09 LAB — MAGNESIUM: Magnesium: 2.1 mg/dL (ref 1.7–2.4)

## 2020-08-09 LAB — GLUCOSE, CAPILLARY
Glucose-Capillary: 81 mg/dL (ref 70–99)
Glucose-Capillary: 170 mg/dL — ABNORMAL HIGH (ref 70–99)
Glucose-Capillary: 244 mg/dL — ABNORMAL HIGH (ref 70–99)
Glucose-Capillary: 253 mg/dL — ABNORMAL HIGH (ref 70–99)

## 2020-08-09 MED ORDER — INSULIN DETEMIR 100 UNIT/ML ~~LOC~~ SOLN
30.0000 [IU] | Freq: Every day | SUBCUTANEOUS | Status: DC
  Administered 2020-08-09 – 2020-08-10 (×2): 30 [IU] via SUBCUTANEOUS
  Filled 2020-08-09 (×3): qty 0.3

## 2020-08-09 MED ORDER — HYDROCORTISONE 20 MG PO TABS
20.0000 mg | ORAL_TABLET | Freq: Every morning | ORAL | Status: DC
  Administered 2020-08-10 – 2020-08-12 (×3): 20 mg via ORAL
  Filled 2020-08-09 (×3): qty 1

## 2020-08-09 MED ORDER — HYDROCORTISONE 10 MG PO TABS
10.0000 mg | ORAL_TABLET | Freq: Every day | ORAL | Status: DC
  Administered 2020-08-10 – 2020-08-12 (×3): 10 mg via ORAL
  Filled 2020-08-09 (×3): qty 1

## 2020-08-09 NOTE — Progress Notes (Signed)
Occupational Therapy Treatment Patient Details Name: [name] [name] MRN: [id number] DOB: [birth year] Today's Date: 08/09/2020    History of present illness Pt is a 60 y.o. female admitted 07/28/20 with SOB, fatigue and poor p.o. intake; workup for DKA, acute hypoxic respiratory failure due to COVID-19 PNA. PMH includes DM2, HTN, chronic pain syndrome, multiple falls (fall s/p R ankle ORIF 02/25/20).   OT comments  Pt. Seen for skilled OT treatment session.  Pt. Able to complete LB dressing min a and chair push ups for cont. Strengthening and endurance improvement.  Reviewed can be completed several times a day as she is feeling up to it.    o2 information, pt. On room air  98 seated before activity 91 after chair push ups 97 after rest   Follow Up Recommendations  SNF    Equipment Recommendations  None recommended by OT    Recommendations for Other Services      Precautions / Restrictions Precautions Precautions: Fall;Other (comment) Precaution Comments: bowel incontinence/urgency; bout of symptomatic hypotension 1/18       Mobility Bed Mobility                  Transfers                      Balance                                           ADL either performed or assessed with clinical judgement   ADL Overall ADL's : Needs assistance/impaired                     Lower Body Dressing: Minimal assistance;Sitting/lateral leans Lower Body Dressing Details (indicate cue type and reason): pt. able to use bues to place each leg over knee, assisted with keeping leg crossed while pt. donned each sock. rest break in between each sock               General ADL Comments: initially states "oh i cant do that" in reference to asking her to don socks. once she was able to noted a big smile! pt. expresses concerns with wanting to make sure she is strong enough before returning home stating "she wont have any help".  reports living with son  and dtr. in law but they work odd shift times and are usually sleeping while she is awake.     Vision       Perception     Praxis      Cognition Arousal/Alertness: Awake/alert Behavior During Therapy: WFL for tasks assessed/performed                                   General Comments: alternating periods of happiness with her progress but then also tearful when speaking of being overwhelmed with feeling so weak and also worry for life after d/c.  encouragement provided.        Exercises General Exercises - Upper Extremity Chair Push Up: AAROM;Both;5 reps   Shoulder Instructions       General Comments      Pertinent Vitals/ Pain       Pain Assessment: No/denies pain  Home Living  Prior Functioning/Environment              Frequency  Min 3X/week        Progress Toward Goals  OT Goals(current goals can now be found in the care plan section)  Progress towards OT goals: Progressing toward goals     Plan Discharge plan remains appropriate    Co-evaluation                 AM-PAC OT "6 Clicks" Daily Activity     Outcome Measure   Help from another person eating meals?: None Help from another person taking care of personal grooming?: A Little Help from another person toileting, which includes using toliet, bedpan, or urinal?: A Lot Help from another person bathing (including washing, rinsing, drying)?: A Lot Help from another person to put on and taking off regular upper body clothing?: A Little Help from another person to put on and taking off regular lower body clothing?: A Lot 6 Click Score: 16    End of Session    OT Visit Diagnosis: Unsteadiness on feet (R26.81);Other abnormalities of gait and mobility (R26.89);Muscle weakness (generalized) (M62.81);History of falling (Z91.81);Other symptoms and signs involving cognitive function   Activity Tolerance Patient tolerated  treatment well   Patient Left in chair;with call bell/phone within reach   Nurse Communication          Time: 9024-0973 OT Time Calculation (min): 15 min  Charges: OT General Charges $OT Visit: 1 Visit OT Treatments $Self Care/Home Management : 8-22 mins  Sonia Baller, COTA/L Acute Rehabilitation [phone]   Janice Coffin 08/09/2020, 1:15 PM

## 2020-08-09 NOTE — Progress Notes (Signed)
PROGRESS NOTE                                                                                                                                                                                                             Patient Demographics:    [name] [name], is a 60 y.o. female, DOB - Dec 07, 1960, [id number]  Outpatient Primary MD for the patient is Antony Blackbird, MD (Inactive)   Admit date - 07/28/2020   LOS - 32  Chief Complaint  Patient presents with   Shortness of Breath       Brief Narrative: Patient is a 60 y.o. female with PMHx of DM-2, HTN, adrenal insufficiency, peripheral neuropathy, chronic back pain admitted by PCCM to the ICU for DKA and COVID-19 pneumonia.  Upon stability-transfer to [facility].   COVID-19 vaccinated status:   Significant Events: 1/9>> Admit to ICU for DKA/hypoxia due to COVID-19 pneumonia 1/13>> transfer to [facility]  Significant studies: 1/9>> chest x-ray: No active disease 1/10>> bilateral lower extremity Doppler: No DVT. 1/10>> Echo: EF 65-70%. 1/10>> chest x-ray: Mild left lower lobe opacity 1/10>> portable abdomen x-ray: Enteric tube projecting over the midline, progressive bibasilar opacities. 1/12>> chest x-ray: Bibasilar pneumonia 1/15>> x-ray abdomen portable: Nonobstructive bowel gas pattern.  COVID-19 medications: Steroids: 1/10>> Baricitinib: 1/11>> 1/13  Antibiotics: Rocephin: 1/10 >> 1/14  Zithromax: 1/10 >> 1/14   Microbiology data: 1/10 >>blood culture: No growth  Procedures: None  Consults: PCCM  DVT prophylaxis: enoxaparin (LOVENOX) injection 40 mg Start: 07/30/20 1230    Subjective:   Claims she is weak-and apparently has a tough time standing up diarrhea has slowed down per patient-and is more formed..   Assessment  & Plan :   Acute Hypoxic Resp Failure due to Covid 19 Viral pneumonia: On room air-was maintained on prednisone  for a few days-but will switch to hydrocortisone-she has a history of adrenal insufficiency.  Fever: afebrile O2 requirements:  SpO2: 96 % O2 Flow Rate (L/min): 3 L/min FiO2 (%): (S) 40 %   COVID-19 Labs: No results for input(s): DDIMER, FERRITIN, LDH, CRP in the last 72 hours.  No results found for: BNP  Recent Labs  Lab 08/03/20 0311  PROCALCITON 0.67    Lab Results  Component Value Date   SARSCOV2NAA POSITIVE (A) 07/28/2020   [place] NEGATIVE 02/25/2020   [place]  NEGATIVE 12/08/2019   [place] NEGATIVE 09/06/2019     Prone/Incentive Spirometry: encouraged  incentive spirometry use 3-4/hour.  DKA: Resolved-required insulin infusion on admission.  Insulin-dependent DM-2 (A1c 12.3 on 1/10): CBG better-but CBG this a.m. down to 81-decrease Levemir to 30 units, continue with 8 units of NovoLog with meals, Tradjenta and SSI.   Recent Labs    08/08/20 2041 08/09/20 0728 08/09/20 1233  GLUCAP 206* 81 170*   Orthostatic hypotension: Likely due to combination of underlying adrenal insufficiency and diarrhea.  We will switch to hydrocortisone-place thigh-high TED hose-and recheck orthostatic vital signs tomorrow.  May need low-dose midodrine as well.    History of adrenal insufficiency: Per patient-this was diagnosed 2-3 years ago-she was initially apparently on steroids but this was discontinued for hypoglycemia and has been maintained on Florinef.  Given stressful situation-with numerous medical issues as outlined above-suspect she needs glucocorticoids and is not mineralocorticoids-to simplify-I will adjust stop prednisone and Florinef and just put her on hydrocortisone.  We will follow and adjust.    Diarrhea: Seems to be waxing and waning-but overall somewhat improved-stools are getting formed-continue Florastor-continue use of as needed Imodium.    Peripheral neuropathy: Continue Cymbalta/gabapentin  Deconditioning/generalized weakness: Secondary to acute  illness-PT recommending SNF-social work following.  Per patient-at baseline-prior to this hospitalization-she was just about able to stand up-and only ambulate by holding onto things/wall around the house.  She has a walker/wheelchair but was not using it consistently.  GI prophylaxis: PPI  ABG:    Component Value Date/Time   HCO3 13.4 (L) 07/29/2020 0917   TCO2 14 (L) 07/29/2020 0917   ACIDBASEDEF 11.0 (H) 07/29/2020 0917   O2SAT 87.0 07/29/2020 0917    Vent Settings: N/A  Condition - Stable  Family Communication  :  Daughter-Shandi-[phone] on 1/21  Code Status :  Full Code  Diet :  Diet Order            Diet Carb Modified Fluid consistency: Thin; Room service appropriate? Yes  Diet effective now                  Disposition Plan  :   Status is: Inpatient  Remains inpatient appropriate because:Inpatient level of care appropriate due to severity of illness   Dispo:  Patient From: Home  Planned Disposition: SNF  Expected discharge date: 08/12/2020  Medically stable for discharge: Yes   Barriers to discharge: Diarrhea/orthostatic hypotension-needs SNF.  Antimicorbials  :    Anti-infectives (From admission, onward)   Start     Dose/Rate Route Frequency Ordered Stop   07/30/20 0015  azithromycin (ZITHROMAX) 500 mg in sodium chloride 0.9 % 250 mL IVPB  Status:  Discontinued        500 mg 250 mL/hr over 60 Minutes Intravenous Every 24 hours 07/29/20 2326 08/03/20 0756   07/29/20 1600  cefTRIAXone (ROCEPHIN) 2 g in sodium chloride 0.9 % 100 mL IVPB        2 g 200 mL/hr over 30 Minutes Intravenous Every 24 hours 07/29/20 1504 08/03/20 1000   07/29/20 1600  doxycycline (VIBRA-TABS) tablet 100 mg  Status:  Discontinued        100 mg Oral Every 12 hours 07/29/20 1504 07/29/20 2326      Inpatient Medications  Scheduled Meds:  dextromethorphan-guaiFENesin  1 tablet Oral BID   DULoxetine  60 mg Oral BID   enoxaparin (LOVENOX) injection  40 mg Subcutaneous  Q24H   famotidine  20 mg Oral Daily   feeding  supplement  237 mL Oral BID BM   fludrocortisone  0.1 mg Oral Daily   gabapentin  600 mg Oral TID   insulin aspart  0-15 Units Subcutaneous TID WC   insulin aspart  0-5 Units Subcutaneous QHS   insulin aspart  8 Units Subcutaneous TID WC   insulin detemir  35 Units Subcutaneous QHS   linagliptin  5 mg Oral Daily   predniSONE  20 mg Oral Q breakfast   saccharomyces boulardii  250 mg Oral BID   traZODone  50 mg Oral QHS   Continuous Infusions:  lactated ringers 10 mL/hr at 08/07/20 1800   PRN Meds:.acetaminophen, albuterol, alum & mag hydroxide-simeth, dextrose, loperamide, methocarbamol, metoprolol tartrate, ondansetron (ZOFRAN) IV, promethazine   Time Spent in minutes  25  See all Orders from today for further details   Oren Binet M.D on 08/09/2020 at 3:31 PM  To page go to [email or web address] - use universal password  [facility] -  Office  [phone]    Objective:   Vitals:   08/08/20 1545 08/08/20 2037 08/09/20 0447 08/09/20 1230  BP: 119/66 119/63 (!) 107/55 138/72  Pulse: (!) 106 100 98 (!) 110  Resp: 18 20 18 19   Temp: 98.4 F (36.9 C) 98.6 F (37 C) 98 F (36.7 C) 98.9 F (37.2 C)  TempSrc: Oral Oral Oral Oral  SpO2: 94% 90% 92% 96%  Weight:      Height:        Wt Readings from Last 3 Encounters:  08/05/20 61.6 kg  06/02/20 63.5 kg  04/20/20 63.5 kg     Intake/Output Summary (Last 24 hours) at 08/09/2020 1531 Last data filed at 08/09/2020 1319 Gross per 24 hour  Intake 475.05 ml  Output --  Net 475.05 ml     Physical Exam Gen Exam:Alert awake-not in any distress HEENT:atraumatic, normocephalic Chest: B/L clear to auscultation anteriorly CVS:S1S2 regular Abdomen:soft non tender, non distended Extremities:no edema Neurology: Non focal Skin: no rash   Data Review:    CBC Recent Labs  Lab 08/06/20 0106 08/06/20 0757 08/07/20 0233 08/08/20 0135 08/09/20 0125  WBC  15.0* 17.6* 12.9* 14.0* 12.6*  HGB 9.6* 10.6* 9.5* 8.9* 8.9*  HCT 25.6* 29.1* 27.5* 25.7* 27.5*  PLT 305 424* 340 351 337  MCV 95.2 93.9 95.8 95.9 98.2  MCH 35.7* 34.2* 33.1 33.2 31.8  MCHC 37.5* 36.4* 34.5 34.6 32.4  RDW 14.6 13.9 13.6 13.9 14.1  LYMPHSABS 1.0 1.9  --   --   --   MONOABS 0.7 0.8  --   --   --   EOSABS 0.0 0.1  --   --   --   BASOSABS 0.0 0.0  --   --   --     Chemistries  Recent Labs  Lab 08/06/20 0106 08/07/20 0233 08/08/20 0135 08/09/20 0125  NA 132* 136 137 137  K 4.3 4.3 4.2 4.1  CL 98 102 101 101  CO2 25 27 27 27   GLUCOSE 275* 230* 301* 180*  BUN 16 10 13 13   CREATININE 0.60 0.54 0.73 0.47  CALCIUM 8.0* 8.0* 8.4* 8.3*  MG 1.8 1.9 2.1 2.1  AST 46*  --   --   --   ALT 77*  --   --   --   ALKPHOS 200*  --   --   --   BILITOT 0.4  --   --   --    ------------------------------------------------------------------------------------------------------------------ No results for input(s): CHOL, HDL, LDLCALC, TRIG,  CHOLHDL, LDLDIRECT in the last 72 hours.  Lab Results  Component Value Date   HGBA1C 12.3 (H) 07/29/2020   ------------------------------------------------------------------------------------------------------------------ No results for input(s): TSH, T4TOTAL, T3FREE, THYROIDAB in the last 72 hours.  Invalid input(s): FREET3 ------------------------------------------------------------------------------------------------------------------ No results for input(s): VITAMINB12, FOLATE, FERRITIN, TIBC, IRON, RETICCTPCT in the last 72 hours.  Coagulation profile No results for input(s): INR, PROTIME in the last 168 hours.  No results for input(s): DDIMER in the last 72 hours.  Cardiac Enzymes No results for input(s): CKMB, TROPONINI, MYOGLOBIN in the last 168 hours.  Invalid input(s): CK ------------------------------------------------------------------------------------------------------------------ No results found for: BNP  Micro  Results No results found for this or any previous visit (from the past 240 hour(s)).  Radiology Reports DG CHEST PORT 1 VIEW  Result Date: 07/31/2020 CLINICAL DATA:  Hypoxia. EXAM: PORTABLE CHEST 1 VIEW COMPARISON:  07/29/2020. FINDINGS: Mediastinum hilar structures normal. Heart size normal. Prominent bibasilar infiltrates consistent with bibasilar pneumonia and or edema noted on today's exam. Tiny bilateral pleural effusions. No pneumothorax. IMPRESSION: Prominent bibasilar infiltrates consistent with bibasilar pneumonia and or edema noted on today's exam. Tiny bilateral pleural effusions. Electronically Signed   By: Marcello Moores  Register   On: 07/31/2020 05:52   Portable chest x-ray (1 view)  Result Date: 07/29/2020 CLINICAL DATA:  Shortness of breath, hypoxia EXAM: PORTABLE CHEST 1 VIEW COMPARISON:  07/28/2020 FINDINGS: Mild left lower lobe opacity, suspicious for pneumonia. Mild right basilar opacity, possibly atelectasis. No frank interstitial edema. No pleural effusion or pneumothorax. IMPRESSION: Mild left lower lobe opacity, suspicious for pneumonia. Electronically Signed   By: Julian Hy M.D.   On: 07/29/2020 13:14   DG Chest Portable 1 View  Result Date: 07/28/2020 CLINICAL DATA:  Shortness of breath EXAM: PORTABLE CHEST 1 VIEW COMPARISON:  11/10/2019 FINDINGS: Heart and mediastinal contours are within normal limits. No focal opacities or effusions. No acute bony abnormality. IMPRESSION: No active disease. Electronically Signed   By: Rolm Baptise M.D.   On: 07/28/2020 19:40   DG Abd Portable 1V  Result Date: 08/03/2020 CLINICAL DATA:  Abdominal distension. EXAM: PORTABLE ABDOMEN - 1 VIEW COMPARISON:  07/29/2020 FINDINGS: Bowel gas pattern is nonobstructive. No free peritoneal air. Minimal degenerate change of the spine and hips. IMPRESSION: Nonobstructive bowel gas pattern. Electronically Signed   By: Marin Olp M.D.   On: 08/03/2020 16:01   DG Abd Portable 1V  Result Date:  07/29/2020 CLINICAL DATA:  Attempt at OG tube. EXAM: PORTABLE ABDOMEN - 1 VIEW COMPARISON:  Radiograph earlier today. FINDINGS: The enteric tube projects over the midline of the chest, tip at the level of the carina. Progressive bibasilar opacities since chest radiograph earlier today. IMPRESSION: 1. Enteric tube projecting over the midline of the chest, tip at the level of the carina. Recommend repositioning. 2. Progressive bibasilar opacities since chest radiograph earlier today, likely COVID pneumonia. Electronically Signed   By: Keith Rake M.D.   On: 07/29/2020 22:04   VAS [place] LOWER EXTREMITY VENOUS (DVT)  Result Date: 07/29/2020  Lower Venous DVT Study Other Indications: Covid+ hypoxia. Performing Technologist: Rogelia Rohrer  Examination Guidelines: A complete evaluation includes B-mode imaging, spectral Doppler, color Doppler, and power Doppler as needed of all accessible portions of each vessel. Bilateral testing is considered an integral part of a complete examination. Limited examinations for reoccurring indications may be performed as noted. The reflux portion of the exam is performed with the patient in reverse Trendelenburg.  +---------+---------------+---------+-----------+----------+--------------+  RIGHT     Compressibility Phasicity Spontaneity  Properties Thrombus Aging  +---------+---------------+---------+-----------+----------+--------------+  CFV       Full            Yes       Yes                                    +---------+---------------+---------+-----------+----------+--------------+  SFJ       Full                                                             +---------+---------------+---------+-----------+----------+--------------+  FV Prox   Full            Yes       Yes                                    +---------+---------------+---------+-----------+----------+--------------+  FV Mid    Full            Yes       Yes                                     +---------+---------------+---------+-----------+----------+--------------+  FV Distal Full            Yes       Yes                                    +---------+---------------+---------+-----------+----------+--------------+  PFV       Full                                                             +---------+---------------+---------+-----------+----------+--------------+  POP       Full            Yes       Yes                                    +---------+---------------+---------+-----------+----------+--------------+  PTV       Full                                                             +---------+---------------+---------+-----------+----------+--------------+  PERO      Full                                                             +---------+---------------+---------+-----------+----------+--------------+   +---------+---------------+---------+-----------+----------+--------------+  LEFT  Compressibility Phasicity Spontaneity Properties Thrombus Aging  +---------+---------------+---------+-----------+----------+--------------+  CFV       Full            Yes       Yes                                    +---------+---------------+---------+-----------+----------+--------------+  SFJ       Full                                                             +---------+---------------+---------+-----------+----------+--------------+  FV Prox   Full            Yes       Yes                                    +---------+---------------+---------+-----------+----------+--------------+  FV Mid    Full            Yes       Yes                                    +---------+---------------+---------+-----------+----------+--------------+  FV Distal Full            Yes       Yes                                    +---------+---------------+---------+-----------+----------+--------------+  PFV       Full                                                              +---------+---------------+---------+-----------+----------+--------------+  POP       Full            Yes       Yes                                    +---------+---------------+---------+-----------+----------+--------------+  PTV       Full                                                             +---------+---------------+---------+-----------+----------+--------------+  PERO      Full                                                             +---------+---------------+---------+-----------+----------+--------------+  Summary: BILATERAL: - No evidence of deep vein thrombosis seen in the lower extremities, bilaterally. - RIGHT: - No cystic structure found in the popliteal fossa.  LEFT: - No cystic structure found in the popliteal fossa.  *See table(s) above for measurements and observations. Electronically signed by Jamelle Haring on 07/29/2020 at 7:25:56 PM.    Final    ECHOCARDIOGRAM LIMITED  Result Date: 07/29/2020    ECHOCARDIOGRAM LIMITED REPORT   Patient Name:   Lehman Prom Date of Exam: 07/29/2020 Medical Rec #:  [id number]     Height:       66.0 in Accession #:    [id number]    Weight:       123.9 lb Date of Birth:  1960/12/31     BSA:          1.632 m Patient Age:    89 years      BP:           137/77 mmHg Patient Gender: F             HR:           155 bpm. Exam Location:  Inpatient Procedure: Limited Echo and Cardiac Doppler Indications:    R06.02 SOB  History:        Patient has prior history of Echocardiogram examinations, most                 recent 07/01/2019. Abnormal ECG, Signs/Symptoms:Syncope; Risk                 Factors:Diabetes. Covid positive.  Sonographer:    Roseanna Rainbow RDCS Referring Phys: [id number] Silvestre Moment [facility]  Sonographer Comments: Heart rate is very high despite meds. IMPRESSIONS  1. Left ventricular ejection fraction, by estimation, is 65 to 70%. The left ventricle has normal function. The left ventricle has no regional wall motion abnormalities.  2. The mitral valve is normal  in structure.  3. The aortic valve is normal in structure.  4. There is mildly elevated pulmonary artery systolic pressure. The estimated right ventricular systolic pressure is Q000111Q mmHg.  5. The inferior vena cava is normal in size with <50% respiratory variability, suggesting right atrial pressure of 8 mmHg. FINDINGS  Left Ventricle: Left ventricular ejection fraction, by estimation, is 65 to 70%. The left ventricle has normal function. The left ventricle has no regional wall motion abnormalities. Right Ventricle: There is mildly elevated pulmonary artery systolic pressure. The tricuspid regurgitant velocity is 2.66 m/s, and with an assumed right atrial pressure of 8 mmHg, the estimated right ventricular systolic pressure is [id number] mmHg. Mitral Valve: The mitral valve is normal in structure. Tricuspid Valve: The tricuspid valve is normal in structure. Tricuspid valve regurgitation is mild. Aortic Valve: The aortic valve is normal in structure. Venous: The inferior vena cava is normal in size with less than 50% respiratory variability, suggesting right atrial pressure of 8 mmHg. IAS/Shunts: The interatrial septum appears to be lipomatous. LEFT VENTRICLE PLAX 2D LVIDd:         3.10 cm LVIDs:         1.70 cm LV PW:         0.90 cm LV IVS:        1.00 cm LVOT diam:     1.60 cm LVOT Area:     2.01 cm  LV Volumes (MOD) LV vol d, MOD A2C: 47.2 ml LV vol d, MOD A4C: 40.2 ml LV vol s, MOD A2C: 14.6 ml LV  vol s, MOD A4C: 16.4 ml LV SV MOD A2C:     32.6 ml LV SV MOD A4C:     40.2 ml LV SV MOD BP:      29.4 ml IVC IVC diam: 2.00 cm LEFT ATRIUM         Index LA diam:    2.30 cm 1.41 cm/m   AORTA Ao Root diam: 2.60 cm TRICUSPID VALVE TR Peak grad:   28.3 mmHg TR Vmax:        266.00 cm/s  SHUNTS Systemic Diam: 1.60 cm Fransico Him MD Electronically signed by Fransico Him MD Signature Date/Time: 07/29/2020/5:07:56 PM    Final

## 2020-08-09 NOTE — Progress Notes (Signed)
Physical Therapy Treatment Patient Details Name: [name] [name] MRN: [id number] DOB: [birth year] Today's Date: 08/09/2020    History of Present Illness Pt is a 60 y.o. female admitted 07/28/20 with SOB, fatigue and poor p.o. intake; workup for DKA, acute hypoxic respiratory failure due to COVID-19 PNA. PMH includes DM2, HTN, chronic pain syndrome, multiple falls (fall s/p R ankle ORIF 02/25/20).    PT Comments    Pt did her longest singular bout of walking today at 35'.  She then did 8'x2 with 5 min seated rest between bouts.  She is slowly improving.  I think she would do well at home with a RW with seat so that she can sit down immediately as she fatigues.  We will trial a 4 wheeled RW with a seat in attempts to get her to walk further next session.  O2 sats and HR were stable on RA throughout session. PT will continue to follow acutely for safe mobility progression.   Follow Up Recommendations  SNF;Supervision for mobility/OOB;Other (comment) (could go home if someone from home could help her anytime she needed to be up on her feet. If not, she is not safe to attempt moving alone.)     Equipment Recommendations  Rolling walker with 5" wheels;Other (comment) (new rollator (hers at home is broken))    Recommendations for Other Services       Precautions / Restrictions Precautions Precautions: Fall;Other (comment) Precaution Comments: monitor BP, have chairs close    Mobility  Bed Mobility               General bed mobility comments: Pt was OOB in recliner chair.  Transfers Overall transfer level: Needs assistance Equipment used: Rolling walker (2 wheeled) Transfers: Sit to/from Stand Sit to Stand: Min guard;Min assist         General transfer comment: min guard assist initially, safe use of UEs during transitions, and heavy reliance on UE to help her get up and control her descent down due to weak LEs.  Multiple sit to stands for multiple bouts of  gait.  Ambulation/Gait Ambulation/Gait assistance: Min guard Gait Distance (Feet): 35 Feet (x1, 8'x2 5 min seated rest break between bouts of gait) Assistive device: Rolling walker (2 wheeled) Gait Pattern/deviations: Step-through pattern;Shuffle;Trunk flexed Gait velocity: Decreased Gait velocity interpretation: <1.31 ft/sec, indicative of household ambulator General Gait Details: Pt with forward head, flexed trunk, kyphotic posture, which she can improve with cues.  Pt self reports feeling more solid on her feet today with first bout of gait, not with consecutive.  She reports she has h/o knees giving out unexpectedly due to meniscus tear.  She would likely do well progressing gait to the hallway with a rollator so that she can sit down as needed and even wheel back if she runs out of energy.  I spoke with her about if a rollator would work in her home (space) as a WC would not and she reports she has a rollator that they got from someone else, but the breaks are broken.  We will try one next session to see if this would be good for her at home.   Stairs             Wheelchair Mobility    Modified Rankin (Stroke Patients Only)       Balance Overall balance assessment: Needs assistance Sitting-balance support: Feet supported;No upper extremity supported Sitting balance-Leahy Scale: Good     Standing balance support: Bilateral upper extremity supported Standing  balance-Leahy Scale: Poor                              Cognition Arousal/Alertness: Awake/alert Behavior During Therapy: WFL for tasks assessed/performed Overall Cognitive Status: Within Functional Limits for tasks assessed                                 General Comments: appears WNL, faiguted, so at times slow to move and respond.      Exercises General Exercises - Upper Extremity Chair Push Up: AAROM;Both;5 reps General Exercises - Lower Extremity Long Arc Quad: AROM;Both;10  reps Hip Flexion/Marching: AROM;Both;10 reps Toe Raises: AROM;Both;20 reps Heel Raises: AROM;Both;20 reps    General Comments General comments (skin integrity, edema, etc.): O2 sats92-100% on RA during gait.      Pertinent Vitals/Pain Pain Assessment: No/denies pain    Home Living                      Prior Function            PT Goals (current goals can now be found in the care plan section) Acute Rehab PT Goals Patient Stated Goal: Hopeful for post-acute rehab to regain strength before return home Progress towards PT goals: Progressing toward goals    Frequency    Min 3X/week      PT Plan Current plan remains appropriate    Co-evaluation              AM-PAC PT "6 Clicks" Mobility   Outcome Measure  Help needed turning from your back to your side while in a flat bed without using bedrails?: A Little Help needed moving from lying on your back to sitting on the side of a flat bed without using bedrails?: A Little Help needed moving to and from a bed to a chair (including a wheelchair)?: A Little Help needed standing up from a chair using your arms (e.g., wheelchair or bedside chair)?: A Little Help needed to walk in hospital room?: A Little Help needed climbing 3-5 steps with a railing? : A Lot 6 Click Score: 17    End of Session   Activity Tolerance: Patient limited by fatigue Patient left: in chair;with call bell/phone within reach   PT Visit Diagnosis: Other abnormalities of gait and mobility (R26.89);Muscle weakness (generalized) (M62.81)     Time: 5027-7412 PT Time Calculation (min) (ACUTE ONLY): 26 min  Charges:  $Gait Training: 23-37 mins                     Verdene Lennert, PT, DPT  Acute Rehabilitation [phone] pager [phone]) [phone] office

## 2020-08-10 LAB — GLUCOSE, CAPILLARY
Glucose-Capillary: 115 mg/dL — ABNORMAL HIGH (ref 70–99)
Glucose-Capillary: 189 mg/dL — ABNORMAL HIGH (ref 70–99)
Glucose-Capillary: 228 mg/dL — ABNORMAL HIGH (ref 70–99)
Glucose-Capillary: 225 mg/dL — ABNORMAL HIGH (ref 70–99)

## 2020-08-10 LAB — CBC
HCT: 23.3 % — ABNORMAL LOW (ref 36.0–46.0)
Hemoglobin: 8.1 g/dL — ABNORMAL LOW (ref 12.0–15.0)
MCH: 33.1 pg (ref 26.0–34.0)
MCHC: 34.8 g/dL (ref 30.0–36.0)
MCV: 95.1 fL (ref 80.0–100.0)
Platelets: 326 10*3/uL (ref 150–400)
RBC: 2.45 MIL/uL — ABNORMAL LOW (ref 3.87–5.11)
RDW: 14.4 % (ref 11.5–15.5)
WBC: 10.4 10*3/uL (ref 4.0–10.5)
nRBC: 0 % (ref 0.0–0.2)

## 2020-08-10 LAB — BASIC METABOLIC PANEL
Anion gap: 9 (ref 5–15)
BUN: 13 mg/dL (ref 6–20)
CO2: 29 mmol/L (ref 22–32)
Calcium: 7.9 mg/dL — ABNORMAL LOW (ref 8.9–10.3)
Chloride: 101 mmol/L (ref 98–111)
Creatinine, Ser: 0.53 mg/dL (ref 0.44–1.00)
GFR, Estimated: >60 mL/min (ref >60)
Glucose, Bld: 148 mg/dL — ABNORMAL HIGH (ref 70–99)
Potassium: 3.8 mmol/L (ref 3.5–5.1)
Sodium: 139 mmol/L (ref 135–145)

## 2020-08-10 LAB — MAGNESIUM: Magnesium: 2 mg/dL (ref 1.7–2.4)

## 2020-08-10 MED ORDER — GLUCERNA SHAKE PO LIQD
237.0000 mL | Freq: Two times a day (BID) | ORAL | Status: DC
  Administered 2020-08-10 – 2020-08-12 (×6): 237 mL via ORAL
  Filled 2020-08-10 (×7): qty 237

## 2020-08-10 NOTE — Progress Notes (Signed)
PROGRESS NOTE                                                                                                                                                                                                             Patient Demographics:    [name] [name], is a 60 y.o. female, DOB - 1961/02/28, [id number]  Outpatient Primary MD for the patient is Antony Blackbird, MD (Inactive)   Admit date - 07/28/2020   LOS - 12  Chief Complaint  Patient presents with  . Shortness of Breath       Brief Narrative: Patient is a 60 y.o. female with PMHx of DM-2, HTN, adrenal insufficiency, peripheral neuropathy, chronic back pain admitted by PCCM to the ICU for DKA and COVID-19 pneumonia.  Upon stability-transfer to [facility].   COVID-19 vaccinated status:   Significant Events: 1/9>> Admit to ICU for DKA/hypoxia due to COVID-19 pneumonia 1/13>> transfer to [facility]  Significant studies: 1/9>> chest x-ray: No active disease 1/10>> bilateral lower extremity Doppler: No DVT. 1/10>> Echo: EF 65-70%. 1/10>> chest x-ray: Mild left lower lobe opacity 1/10>> portable abdomen x-ray: Enteric tube projecting over the midline, progressive bibasilar opacities. 1/12>> chest x-ray: Bibasilar pneumonia 1/15>> x-ray abdomen portable: Nonobstructive bowel gas pattern.  COVID-19 medications: Steroids: 1/10>> Baricitinib: 1/11>> 1/13  Antibiotics: Rocephin: 1/10 >> 1/14  Zithromax: 1/10 >> 1/14   Microbiology data: 1/10 >>blood culture: No growth  Procedures: None  Consults: PCCM  DVT prophylaxis: Place TED hose Start: 08/09/20 1538 enoxaparin (LOVENOX) injection 40 mg Start: 07/30/20 1230    Subjective:   No major issues overnight-continues to be very weak-unable to stand on her own.  Had 2-3 stools yesterday per patient-but they were formed and not loose.   Assessment  & Plan :   Acute Hypoxic Resp Failure due to  Covid 19 Viral pneumonia: On room air-was maintained on prednisone for a few days-but has been switched to hydrocortisone given history of adrenal insufficiency.    Fever: afebrile O2 requirements:  SpO2: 95 % O2 Flow Rate (L/min): 3 L/min FiO2 (%): (S) 40 %   COVID-19 Labs: No results for input(s): DDIMER, FERRITIN, LDH, CRP in the last 72 hours.  No results found for: BNP  No results for input(s): PROCALCITON in the last 168 hours.  Lab Results  Component Value Date  SARSCOV2NAA POSITIVE (A) 07/28/2020   SARSCOV2NAA NEGATIVE 02/25/2020   [place] NEGATIVE 12/08/2019   [place] NEGATIVE 09/06/2019     Prone/Incentive Spirometry: encouraged  incentive spirometry use 3-4/hour.  DKA: Resolved-required insulin infusion on admission.  Insulin-dependent DM-2 (A1c 12.3 on 1/10): CBG reasonably well controlled better-but CBG this a.m. down to 81-decrease Levemir to 30 units, continue with 8 units of NovoLog with meals, Tradjenta and SSI.   Recent Labs    08/09/20 2006 08/10/20 0803 08/10/20 1158  GLUCAP 253* 115* 189*   Orthostatic hypotension: Likely due to combination of underlying adrenal insufficiency and diarrhea.  We will switch to hydrocortisone-place thigh-high TED hose-and recheck orthostatic vital signs tomorrow.  May need low-dose midodrine as well.    History of adrenal insufficiency: Per patient-this was diagnosed 2-3 years ago-she was initially apparently on steroids but this was discontinued for hypoglycemia and has been maintained on Florinef.  Given stressful situation-with numerous medical issues as outlined above-suspect she needs glucocorticoids and just not mineralocorticoids-hence she was switched to hydrocortisone.  Place thigh-high Ted hose-and reassess orthostatic vital signs tomorrow.    Diarrhea: Seems to be waxing and waning-but overall somewhat improved-stools are getting formed-continue Florastor-continue use of as needed Imodium.    Peripheral  neuropathy: Continue Cymbalta/gabapentin  Deconditioning/generalized weakness: Secondary to acute illness-PT recommending SNF-social work following.  Per patient-at baseline-prior to this hospitalization-she was just about able to stand up-and only ambulate by holding onto things/wall around the house.  She has a walker/wheelchair but was not using it consistently.  GI prophylaxis: PPI  ABG:    Component Value Date/Time   HCO3 13.4 (L) 07/29/2020 0917   TCO2 14 (L) 07/29/2020 0917   ACIDBASEDEF 11.0 (H) 07/29/2020 0917   O2SAT 87.0 07/29/2020 0917    Vent Settings: N/A  Condition - Stable  Family Communication  :  Daughter-[phone] on 1/21  Code Status :  Full Code  Diet :  Diet Order            Diet Carb Modified Fluid consistency: Thin; Room service appropriate? Yes  Diet effective now                  Disposition Plan  :   Status is: Inpatient  Remains inpatient appropriate because:Inpatient level of care appropriate due to severity of illness   Dispo:  Patient From: Home  Planned Disposition: SNF  Expected discharge date: 08/12/2020  Medically stable for discharge: Yes   Barriers to discharge: Diarrhea/orthostatic hypotension-needs SNF.  Antimicorbials  :    Anti-infectives (From admission, onward)   Start     Dose/Rate Route Frequency Ordered Stop   07/30/20 0015  azithromycin (ZITHROMAX) 500 mg in sodium chloride 0.9 % 250 mL IVPB  Status:  Discontinued        500 mg 250 mL/hr over 60 Minutes Intravenous Every 24 hours 07/29/20 2326 08/03/20 0756   07/29/20 1600  cefTRIAXone (ROCEPHIN) 2 g in sodium chloride 0.9 % 100 mL IVPB        2 g 200 mL/hr over 30 Minutes Intravenous Every 24 hours 07/29/20 1504 08/03/20 1000   07/29/20 1600  doxycycline (VIBRA-TABS) tablet 100 mg  Status:  Discontinued        100 mg Oral Every 12 hours 07/29/20 1504 07/29/20 2326      Inpatient Medications  Scheduled Meds: . dextromethorphan-guaiFENesin  1  tablet Oral BID  . DULoxetine  60 mg Oral BID  . enoxaparin (LOVENOX) injection  40 mg Subcutaneous Q24H  .  famotidine  20 mg Oral Daily  . feeding supplement (GLUCERNA SHAKE)  237 mL Oral BID BM  . gabapentin  600 mg Oral TID  . hydrocortisone  20 mg Oral q morning - 10a   And  . hydrocortisone  10 mg Oral Q2200  . insulin aspart  0-15 Units Subcutaneous TID WC  . insulin aspart  0-5 Units Subcutaneous QHS  . insulin aspart  8 Units Subcutaneous TID WC  . insulin detemir  30 Units Subcutaneous QHS  . linagliptin  5 mg Oral Daily  . saccharomyces boulardii  250 mg Oral BID  . traZODone  50 mg Oral QHS   Continuous Infusions: . lactated ringers 10 mL/hr at 08/09/20 2048   PRN Meds:.acetaminophen, albuterol, alum & mag hydroxide-simeth, dextrose, loperamide, methocarbamol, metoprolol tartrate, ondansetron (ZOFRAN) IV, promethazine   Time Spent in minutes  25  See all Orders from today for further details   Oren Binet M.D on 08/10/2020 at 1:18 PM  To page go to [email or web address] - use universal password  [facility] -  Office  [phone]    Objective:   Vitals:   08/09/20 1230 08/09/20 2011 08/10/20 0409 08/10/20 0830  BP: 138/72 132/66 (!) 110/57   Pulse: (!) 110 (!) 110 100   Resp: 19 16 17    Temp: 98.9 F (37.2 C) 98.3 F (36.8 C) 98.4 F (36.9 C)   TempSrc: Oral Oral Oral   SpO2: 96% 95% 91% 95%  Weight:   60.9 kg   Height:        Wt Readings from Last 3 Encounters:  08/10/20 60.9 kg  06/02/20 63.5 kg  04/20/20 63.5 kg     Intake/Output Summary (Last 24 hours) at 08/10/2020 1318 Last data filed at 08/10/2020 0500 Gross per 24 hour  Intake 485.94 ml  Output -  Net 485.94 ml     Physical Exam Gen Exam:Alert awake-not in any distress HEENT:atraumatic, normocephalic Chest: B/L clear to auscultation anteriorly CVS:S1S2 regular Abdomen:soft non tender, non distended Extremities:no edema Neurology: Non focal Skin: no rash   Data Review:     CBC Recent Labs  Lab 08/06/20 0106 08/06/20 0757 08/07/20 0233 08/08/20 0135 08/09/20 0125 08/10/20 0500  WBC 15.0* 17.6* 12.9* 14.0* 12.6* 10.4  HGB 9.6* 10.6* 9.5* 8.9* 8.9* 8.1*  HCT 25.6* 29.1* 27.5* 25.7* 27.5* 23.3*  PLT 305 424* 340 351 337 326  MCV 95.2 93.9 95.8 95.9 98.2 95.1  MCH 35.7* 34.2* 33.1 33.2 31.8 33.1  MCHC 37.5* 36.4* 34.5 34.6 32.4 34.8  RDW 14.6 13.9 13.6 13.9 14.1 14.4  LYMPHSABS 1.0 1.9  --   --   --   --   MONOABS 0.7 0.8  --   --   --   --   EOSABS 0.0 0.1  --   --   --   --   BASOSABS 0.0 0.0  --   --   --   --     Chemistries  Recent Labs  Lab 08/06/20 0106 08/07/20 0233 08/08/20 0135 08/09/20 0125 08/10/20 0500  NA 132* 136 137 137 139  K 4.3 4.3 4.2 4.1 3.8  CL 98 102 101 101 101  CO2 25 27 27 27 29   GLUCOSE 275* 230* 301* 180* 148*  BUN 16 10 13 13 13   CREATININE 0.60 0.54 0.73 0.47 0.53  CALCIUM 8.0* 8.0* 8.4* 8.3* 7.9*  MG 1.8 1.9 2.1 2.1 2.0  AST 46*  --   --   --   --  ALT 77*  --   --   --   --   ALKPHOS 200*  --   --   --   --   BILITOT 0.4  --   --   --   --    ------------------------------------------------------------------------------------------------------------------ No results for input(s): CHOL, HDL, LDLCALC, TRIG, CHOLHDL, LDLDIRECT in the last 72 hours.  Lab Results  Component Value Date   HGBA1C 12.3 (H) 07/29/2020   ------------------------------------------------------------------------------------------------------------------ No results for input(s): TSH, T4TOTAL, T3FREE, THYROIDAB in the last 72 hours.  Invalid input(s): FREET3 ------------------------------------------------------------------------------------------------------------------ No results for input(s): VITAMINB12, FOLATE, FERRITIN, TIBC, IRON, RETICCTPCT in the last 72 hours.  Coagulation profile No results for input(s): INR, PROTIME in the last 168 hours.  No results for input(s): DDIMER in the last 72 hours.  Cardiac  Enzymes No results for input(s): CKMB, TROPONINI, MYOGLOBIN in the last 168 hours.  Invalid input(s): CK ------------------------------------------------------------------------------------------------------------------ No results found for: BNP  Micro Results No results found for this or any previous visit (from the past 240 hour(s)).  Radiology Reports DG CHEST PORT 1 VIEW  Result Date: 07/31/2020 CLINICAL DATA:  Hypoxia. EXAM: PORTABLE CHEST 1 VIEW COMPARISON:  07/29/2020. FINDINGS: Mediastinum hilar structures normal. Heart size normal. Prominent bibasilar infiltrates consistent with bibasilar pneumonia and or edema noted on today's exam. Tiny bilateral pleural effusions. No pneumothorax. IMPRESSION: Prominent bibasilar infiltrates consistent with bibasilar pneumonia and or edema noted on today's exam. Tiny bilateral pleural effusions. Electronically Signed   By: Marcello Moores  Register   On: 07/31/2020 05:52   Portable chest x-ray (1 view)  Result Date: 07/29/2020 CLINICAL DATA:  Shortness of breath, hypoxia EXAM: PORTABLE CHEST 1 VIEW COMPARISON:  07/28/2020 FINDINGS: Mild left lower lobe opacity, suspicious for pneumonia. Mild right basilar opacity, possibly atelectasis. No frank interstitial edema. No pleural effusion or pneumothorax. IMPRESSION: Mild left lower lobe opacity, suspicious for pneumonia. Electronically Signed   By: Julian Hy M.D.   On: 07/29/2020 13:14   DG Chest Portable 1 View  Result Date: 07/28/2020 CLINICAL DATA:  Shortness of breath EXAM: PORTABLE CHEST 1 VIEW COMPARISON:  11/10/2019 FINDINGS: Heart and mediastinal contours are within normal limits. No focal opacities or effusions. No acute bony abnormality. IMPRESSION: No active disease. Electronically Signed   By: Rolm Baptise M.D.   On: 07/28/2020 19:40   DG Abd Portable 1V  Result Date: 08/03/2020 CLINICAL DATA:  Abdominal distension. EXAM: PORTABLE ABDOMEN - 1 VIEW COMPARISON:  07/29/2020 FINDINGS: Bowel gas  pattern is nonobstructive. No free peritoneal air. Minimal degenerate change of the spine and hips. IMPRESSION: Nonobstructive bowel gas pattern. Electronically Signed   By: Marin Olp M.D.   On: 08/03/2020 16:01   DG Abd Portable 1V  Result Date: 07/29/2020 CLINICAL DATA:  Attempt at OG tube. EXAM: PORTABLE ABDOMEN - 1 VIEW COMPARISON:  Radiograph earlier today. FINDINGS: The enteric tube projects over the midline of the chest, tip at the level of the carina. Progressive bibasilar opacities since chest radiograph earlier today. IMPRESSION: 1. Enteric tube projecting over the midline of the chest, tip at the level of the carina. Recommend repositioning. 2. Progressive bibasilar opacities since chest radiograph earlier today, likely COVID pneumonia. Electronically Signed   By: Keith Rake M.D.   On: 07/29/2020 22:04   VAS [place] LOWER EXTREMITY VENOUS (DVT)  Result Date: 07/29/2020  Lower Venous DVT Study Other Indications: Covid+ hypoxia. Performing Technologist: Rogelia Rohrer  Examination Guidelines: A complete evaluation includes B-mode imaging, spectral Doppler, color Doppler, and power Doppler  as needed of all accessible portions of each vessel. Bilateral testing is considered an integral part of a complete examination. Limited examinations for reoccurring indications may be performed as noted. The reflux portion of the exam is performed with the patient in reverse Trendelenburg.  +---------+---------------+---------+-----------+----------+--------------+ RIGHT    CompressibilityPhasicitySpontaneityPropertiesThrombus Aging +---------+---------------+---------+-----------+----------+--------------+ CFV      Full           Yes      Yes                                 +---------+---------------+---------+-----------+----------+--------------+ SFJ      Full                                                        +---------+---------------+---------+-----------+----------+--------------+  FV Prox  Full           Yes      Yes                                 +---------+---------------+---------+-----------+----------+--------------+ FV Mid   Full           Yes      Yes                                 +---------+---------------+---------+-----------+----------+--------------+ FV DistalFull           Yes      Yes                                 +---------+---------------+---------+-----------+----------+--------------+ PFV      Full                                                        +---------+---------------+---------+-----------+----------+--------------+ POP      Full           Yes      Yes                                 +---------+---------------+---------+-----------+----------+--------------+ PTV      Full                                                        +---------+---------------+---------+-----------+----------+--------------+ PERO     Full                                                        +---------+---------------+---------+-----------+----------+--------------+   +---------+---------------+---------+-----------+----------+--------------+ LEFT     CompressibilityPhasicitySpontaneityPropertiesThrombus Aging +---------+---------------+---------+-----------+----------+--------------+ CFV      Full  Yes      Yes                                 +---------+---------------+---------+-----------+----------+--------------+ SFJ      Full                                                        +---------+---------------+---------+-----------+----------+--------------+ FV Prox  Full           Yes      Yes                                 +---------+---------------+---------+-----------+----------+--------------+ FV Mid   Full           Yes      Yes                                 +---------+---------------+---------+-----------+----------+--------------+ FV DistalFull           Yes      Yes                                  +---------+---------------+---------+-----------+----------+--------------+ PFV      Full                                                        +---------+---------------+---------+-----------+----------+--------------+ POP      Full           Yes      Yes                                 +---------+---------------+---------+-----------+----------+--------------+ PTV      Full                                                        +---------+---------------+---------+-----------+----------+--------------+ PERO     Full                                                        +---------+---------------+---------+-----------+----------+--------------+     Summary: BILATERAL: - No evidence of deep vein thrombosis seen in the lower extremities, bilaterally. - RIGHT: - No cystic structure found in the popliteal fossa.  LEFT: - No cystic structure found in the popliteal fossa.  *See table(s) above for measurements and observations. Electronically signed by Jamelle Haring on 07/29/2020 at 7:25:56 PM.    Final    ECHOCARDIOGRAM LIMITED  Result Date: 07/29/2020    ECHOCARDIOGRAM LIMITED REPORT   Patient Name:   Lehman Prom Date of Exam:  07/29/2020 Medical Rec #:  [id number]     Height:       66.0 in Accession #:    [id number]    Weight:       123.9 lb Date of Birth:  Jan 08, 1961     BSA:          1.632 m Patient Age:    73 years      BP:           137/77 mmHg Patient Gender: F             HR:           155 bpm. Exam Location:  Inpatient Procedure: Limited Echo and Cardiac Doppler Indications:    R06.02 SOB  History:        Patient has prior history of Echocardiogram examinations, most                 recent 07/01/2019. Abnormal ECG, Signs/Symptoms:Syncope; Risk                 Factors:Diabetes. Covid positive.  Sonographer:    Roseanna Rainbow RDCS Referring Phys: [id number] Silvestre Moment [facility]  Sonographer Comments: Heart rate is very high despite meds. IMPRESSIONS  1. Left ventricular  ejection fraction, by estimation, is 65 to 70%. The left ventricle has normal function. The left ventricle has no regional wall motion abnormalities.  2. The mitral valve is normal in structure.  3. The aortic valve is normal in structure.  4. There is mildly elevated pulmonary artery systolic pressure. The estimated right ventricular systolic pressure is Q000111Q mmHg.  5. The inferior vena cava is normal in size with <50% respiratory variability, suggesting right atrial pressure of 8 mmHg. FINDINGS  Left Ventricle: Left ventricular ejection fraction, by estimation, is 65 to 70%. The left ventricle has normal function. The left ventricle has no regional wall motion abnormalities. Right Ventricle: There is mildly elevated pulmonary artery systolic pressure. The tricuspid regurgitant velocity is 2.66 m/s, and with an assumed right atrial pressure of 8 mmHg, the estimated right ventricular systolic pressure is [id number] mmHg. Mitral Valve: The mitral valve is normal in structure. Tricuspid Valve: The tricuspid valve is normal in structure. Tricuspid valve regurgitation is mild. Aortic Valve: The aortic valve is normal in structure. Venous: The inferior vena cava is normal in size with less than 50% respiratory variability, suggesting right atrial pressure of 8 mmHg. IAS/Shunts: The interatrial septum appears to be lipomatous. LEFT VENTRICLE PLAX 2D LVIDd:         3.10 cm LVIDs:         1.70 cm LV PW:         0.90 cm LV IVS:        1.00 cm LVOT diam:     1.60 cm LVOT Area:     2.01 cm  LV Volumes (MOD) LV vol d, MOD A2C: 47.2 ml LV vol d, MOD A4C: 40.2 ml LV vol s, MOD A2C: 14.6 ml LV vol s, MOD A4C: 16.4 ml LV SV MOD A2C:     32.6 ml LV SV MOD A4C:     40.2 ml LV SV MOD BP:      29.4 ml IVC IVC diam: 2.00 cm LEFT ATRIUM         Index LA diam:    2.30 cm 1.41 cm/m   AORTA Ao Root diam: 2.60 cm TRICUSPID VALVE TR Peak grad:   28.3 mmHg TR Vmax:  266.00 cm/s  SHUNTS Systemic Diam: 1.60 cm Fransico Him MD Electronically  signed by Fransico Him MD Signature Date/Time: 07/29/2020/5:07:56 PM    Final

## 2020-08-11 LAB — BASIC METABOLIC PANEL
Anion gap: 8 (ref 5–15)
BUN: 13 mg/dL (ref 6–20)
CO2: 27 mmol/L (ref 22–32)
Calcium: 8 mg/dL — ABNORMAL LOW (ref 8.9–10.3)
Chloride: 100 mmol/L (ref 98–111)
Creatinine, Ser: 0.48 mg/dL (ref 0.44–1.00)
GFR, Estimated: >60 mL/min (ref >60)
Glucose, Bld: 180 mg/dL — ABNORMAL HIGH (ref 70–99)
Potassium: 3.9 mmol/L (ref 3.5–5.1)
Sodium: 135 mmol/L (ref 135–145)

## 2020-08-11 LAB — CBC
HCT: 25.2 % — ABNORMAL LOW (ref 36.0–46.0)
Hemoglobin: 8.5 g/dL — ABNORMAL LOW (ref 12.0–15.0)
MCH: 31.4 pg (ref 26.0–34.0)
MCHC: 33.7 g/dL (ref 30.0–36.0)
MCV: 93 fL (ref 80.0–100.0)
Platelets: 358 10*3/uL (ref 150–400)
RBC: 2.71 MIL/uL — ABNORMAL LOW (ref 3.87–5.11)
RDW: 14.8 % (ref 11.5–15.5)
WBC: 10.9 10*3/uL — ABNORMAL HIGH (ref 4.0–10.5)
nRBC: 0 % (ref 0.0–0.2)

## 2020-08-11 LAB — MAGNESIUM: Magnesium: 2 mg/dL (ref 1.7–2.4)

## 2020-08-11 LAB — GLUCOSE, CAPILLARY
Glucose-Capillary: 64 mg/dL — ABNORMAL LOW (ref 70–99)
Glucose-Capillary: 187 mg/dL — ABNORMAL HIGH (ref 70–99)
Glucose-Capillary: 206 mg/dL — ABNORMAL HIGH (ref 70–99)
Glucose-Capillary: 197 mg/dL — ABNORMAL HIGH (ref 70–99)

## 2020-08-11 MED ORDER — INSULIN DETEMIR 100 UNIT/ML ~~LOC~~ SOLN
26.0000 [IU] | Freq: Every day | SUBCUTANEOUS | Status: DC
  Administered 2020-08-11 – 2020-08-12 (×2): 26 [IU] via SUBCUTANEOUS
  Filled 2020-08-11 (×2): qty 0.26

## 2020-08-11 MED ORDER — SODIUM CHLORIDE 0.9 % IV SOLN: INTRAVENOUS | Status: AC

## 2020-08-11 MED ORDER — INSULIN ASPART 100 UNIT/ML ~~LOC~~ SOLN: 12.0000 [IU] | Freq: Three times a day (TID) | SUBCUTANEOUS | Status: DC

## 2020-08-11 MED ORDER — GUAIFENESIN ER 600 MG PO TB12: 600.0000 mg | ORAL_TABLET | Freq: Two times a day (BID) | ORAL | Status: DC

## 2020-08-11 MED ORDER — INSULIN ASPART 100 UNIT/ML ~~LOC~~ SOLN
10.0000 [IU] | Freq: Three times a day (TID) | SUBCUTANEOUS | Status: DC
  Administered 2020-08-11 – 2020-08-12 (×5): 10 [IU] via SUBCUTANEOUS

## 2020-08-11 MED ORDER — INSULIN DETEMIR 100 UNIT/ML ~~LOC~~ SOLN
32.0000 [IU] | Freq: Every day | SUBCUTANEOUS | Status: DC
  Filled 2020-08-11: qty 0.32

## 2020-08-11 MED ORDER — MIDODRINE HCL 5 MG PO TABS
5.0000 mg | ORAL_TABLET | Freq: Three times a day (TID) | ORAL | Status: DC
  Administered 2020-08-11 – 2020-08-12 (×5): 5 mg via ORAL
  Filled 2020-08-11 (×5): qty 1

## 2020-08-11 NOTE — Progress Notes (Addendum)
PROGRESS NOTE                                                                                                                                                                                                             Patient Demographics:    [name] [name], is a 60 y.o. female, DOB - 18-Feb-1961, MI:[id number]  Outpatient Primary MD for the patient is Antony Blackbird, MD (Inactive)   Admit date - 07/28/2020   LOS - 75  Chief Complaint  Patient presents with  . Shortness of Breath       Brief Narrative: Patient is a 60 y.o. female with PMHx of DM-2, HTN, adrenal insufficiency, peripheral neuropathy, chronic back pain admitted by PCCM to the ICU for DKA and COVID-19 pneumonia.  Upon stability-transfer to the [facility].   COVID-19 vaccinated status:   Significant Events: 1/9>> Admit to ICU for DKA/hypoxia due to COVID-19 pneumonia 1/13>> transfer to [facility]  Significant studies: 1/9>> chest x-ray: No active disease 1/10>> bilateral lower extremity Doppler: No DVT. 1/10>> Echo: EF 65-70%. 1/10>> chest x-ray: Mild left lower lobe opacity 1/10>> portable abdomen x-ray: Enteric tube projecting over the midline, progressive bibasilar opacities. 1/12>> chest x-ray: Bibasilar pneumonia 1/15>> x-ray abdomen portable: Nonobstructive bowel gas pattern.  COVID-19 medications: Steroids: 1/10>> Baricitinib: 1/11>> 1/13  Antibiotics: Rocephin: 1/10 >> 1/14  Zithromax: 1/10 >> 1/14   Microbiology data: 1/10 >>blood culture: No growth  Procedures: None  Consults: PCCM  DVT prophylaxis: Place TED hose Start: 08/09/20 1538 enoxaparin (LOVENOX) injection 40 mg Start: 07/30/20 1230    Subjective:   Does not have any major complaints-continues to feel weak-thinks she is slowly improving-and getting close to baseline and wants to go home tomorrow.  Continues to have orthostatic hypotension.  Diarrhea has  essentially resolved per patient.   Assessment  & Plan :   Acute Hypoxic Resp Failure due to Covid 19 Viral pneumonia: On room air-was maintained on prednisone for a few days-but has been switched to hydrocortisone given history of adrenal insufficiency.    Fever: afebrile O2 requirements:  SpO2: 93 % O2 Flow Rate (L/min): 3 L/min FiO2 (%): (S) 40 %   COVID-19 Labs: No results for input(s): DDIMER, FERRITIN, LDH, CRP in the last 72 hours.  No results found for: BNP  No results for input(s): PROCALCITON in the last  168 hours.  Lab Results  Component Value Date   SARSCOV2NAA POSITIVE (A) 07/28/2020   SARSCOV2NAA NEGATIVE 02/25/2020   [place] NEGATIVE 12/08/2019   [place] NEGATIVE 09/06/2019     Prone/Incentive Spirometry: encouraged  incentive spirometry use 3-4/hour.  DKA: Resolved-required insulin infusion on admission.  Insulin-dependent DM-2 (A1c 12.3 on 1/10): CBG reasonably well controlled better-hypoglycemic this morning-decrease Lantus to 26 units, increase Premeal NovoLog to 10 units.  Continue to follow and adjust.   Recent Labs    08/10/20 1623 08/10/20 2055 08/11/20 0759  GLUCAP 228* 225* 64*   Orthostatic hypotension: Likely due to combination of underlying adrenal insufficiency and diarrhea.  Continue hydrocortisone-add midodrine-hydrate for a few hours.  Recheck orthostatic vital signs tomorrow.   History of adrenal insufficiency: Per patient-this was diagnosed 2-3 years ago-she was initially apparently on steroids but this was discontinued for hyperglycemia and has been maintained on Florinef.  Given stressful situation-with numerous medical issues as outlined above-suspect she needs glucocorticoids and just not mineralocorticoids-hence she was switched to hydrocortisone.    Diarrhea: Seems to be waxing and waning-but overall somewhat improved-stools are getting formed-continue Florastor-continue use of as needed Imodium.    Peripheral neuropathy:  Continue Cymbalta/gabapentin  Deconditioning/generalized weakness: Secondary to acute illness-PT recommending SNF-social work following.  Per patient-at baseline-prior to this hospitalization-she was just about able to stand up-and only ambulate by holding onto things/wall around the house.  She has a walker/wheelchair but was not using it consistently.  GI prophylaxis: PPI  ABG:    Component Value Date/Time   HCO3 13.4 (L) 07/29/2020 0917   TCO2 14 (L) 07/29/2020 0917   ACIDBASEDEF 11.0 (H) 07/29/2020 0917   O2SAT 87.0 07/29/2020 0917    Vent Settings: N/A  Condition - Stable  Family Communication  :  Daughter-Shandi-[phone] on 1/21-patient to update family herself.  Code Status :  Full Code  Diet :  Diet Order            Diet Carb Modified Fluid consistency: Thin; Room service appropriate? Yes  Diet effective now                  Disposition Plan  :   Status is: Inpatient  Remains inpatient appropriate because:Inpatient level of care appropriate due to severity of illness   Dispo:  Patient From: Home  Planned Disposition: SNF  Expected discharge date: 08/12/2020  Medically stable for discharge: Yes   Barriers to discharge: Diarrhea/orthostatic hypotension-needs SNF.  Antimicorbials  :    Anti-infectives (From admission, onward)   Start     Dose/Rate Route Frequency Ordered Stop   07/30/20 0015  azithromycin (ZITHROMAX) 500 mg in sodium chloride 0.9 % 250 mL IVPB  Status:  Discontinued        500 mg 250 mL/hr over 60 Minutes Intravenous Every 24 hours 07/29/20 2326 08/03/20 0756   07/29/20 1600  cefTRIAXone (ROCEPHIN) 2 g in sodium chloride 0.9 % 100 mL IVPB        2 g 200 mL/hr over 30 Minutes Intravenous Every 24 hours 07/29/20 1504 08/03/20 1000   07/29/20 1600  doxycycline (VIBRA-TABS) tablet 100 mg  Status:  Discontinued        100 mg Oral Every 12 hours 07/29/20 1504 07/29/20 2326      Inpatient Medications  Scheduled Meds: .  dextromethorphan-guaiFENesin  1 tablet Oral BID  . DULoxetine  60 mg Oral BID  . enoxaparin (LOVENOX) injection  40 mg Subcutaneous Q24H  . famotidine  20 mg  Oral Daily  . feeding supplement (GLUCERNA SHAKE)  237 mL Oral BID BM  . gabapentin  600 mg Oral TID  . hydrocortisone  20 mg Oral q morning - 10a   And  . hydrocortisone  10 mg Oral Q2200  . insulin aspart  0-15 Units Subcutaneous TID WC  . insulin aspart  0-5 Units Subcutaneous QHS  . insulin aspart  10 Units Subcutaneous TID WC  . insulin detemir  26 Units Subcutaneous QHS  . linagliptin  5 mg Oral Daily  . midodrine  5 mg Oral TID WC  . saccharomyces boulardii  250 mg Oral BID  . traZODone  50 mg Oral QHS   Continuous Infusions: . sodium chloride 50 mL/hr at 08/11/20 0943  . lactated ringers Stopped (08/11/20 0940)   PRN Meds:.acetaminophen, albuterol, alum & mag hydroxide-simeth, dextrose, loperamide, methocarbamol, metoprolol tartrate, ondansetron (ZOFRAN) IV, promethazine   Time Spent in minutes  25  See all Orders from today for further details   Oren Binet M.D on 08/11/2020 at 11:18 AM  To page go to [email or web address] - use universal password  [facility] -  Office  [phone]    Objective:   Vitals:   08/10/20 0830 08/10/20 2052 08/11/20 0513 08/11/20 0740  BP:  (!) 141/73 113/66   Pulse:  (!) 108 (!) 107   Resp:  17 19   Temp:  98.7 F (37.1 C) 98.5 F (36.9 C) 98.7 F (37.1 C)  TempSrc:  Oral Oral Oral  SpO2: 95% 96% 93% 93%  Weight:   60.2 kg   Height:        Wt Readings from Last 3 Encounters:  08/11/20 60.2 kg  06/02/20 63.5 kg  04/20/20 63.5 kg     Intake/Output Summary (Last 24 hours) at 08/11/2020 1118 Last data filed at 08/11/2020 0900 Gross per 24 hour  Intake 577.59 ml  Output -  Net 577.59 ml     Physical Exam Gen Exam:Alert awake-not in any distress HEENT:atraumatic, normocephalic Chest: B/L clear to auscultation anteriorly CVS:S1S2 regular Abdomen:soft non  tender, non distended Extremities:no edema Neurology: Non focal Skin: no rash   Data Review:    CBC Recent Labs  Lab 08/06/20 0106 08/06/20 0757 08/07/20 0233 08/08/20 0135 08/09/20 0125 08/10/20 0500 08/11/20 0136  WBC 15.0* 17.6* 12.9* 14.0* 12.6* 10.4 10.9*  HGB 9.6* 10.6* 9.5* 8.9* 8.9* 8.1* 8.5*  HCT 25.6* 29.1* 27.5* 25.7* 27.5* 23.3* 25.2*  PLT 305 424* 340 351 337 326 358  MCV 95.2 93.9 95.8 95.9 98.2 95.1 93.0  MCH 35.7* 34.2* 33.1 33.2 31.8 33.1 31.4  MCHC 37.5* 36.4* 34.5 34.6 32.4 34.8 33.7  RDW 14.6 13.9 13.6 13.9 14.1 14.4 14.8  LYMPHSABS 1.0 1.9  --   --   --   --   --   MONOABS 0.7 0.8  --   --   --   --   --   EOSABS 0.0 0.1  --   --   --   --   --   BASOSABS 0.0 0.0  --   --   --   --   --     Chemistries  Recent Labs  Lab 08/06/20 0106 08/07/20 0233 08/08/20 0135 08/09/20 0125 08/10/20 0500 08/11/20 0136  NA 132* 136 137 137 139 135  K 4.3 4.3 4.2 4.1 3.8 3.9  CL 98 102 101 101 101 100  CO2 25 27 27 27 29 27   GLUCOSE 275* 230* 301* 180* 148*  180*  BUN 16 10 13 13 13 13   CREATININE 0.60 0.54 0.73 0.47 0.53 0.48  CALCIUM 8.0* 8.0* 8.4* 8.3* 7.9* 8.0*  MG 1.8 1.9 2.1 2.1 2.0 2.0  AST 46*  --   --   --   --   --   ALT 77*  --   --   --   --   --   ALKPHOS 200*  --   --   --   --   --   BILITOT 0.4  --   --   --   --   --    ------------------------------------------------------------------------------------------------------------------ No results for input(s): CHOL, HDL, LDLCALC, TRIG, CHOLHDL, LDLDIRECT in the last 72 hours.  Lab Results  Component Value Date   HGBA1C 12.3 (H) 07/29/2020   ------------------------------------------------------------------------------------------------------------------ No results for input(s): TSH, T4TOTAL, T3FREE, THYROIDAB in the last 72 hours.  Invalid input(s): FREET3 ------------------------------------------------------------------------------------------------------------------ No results for  input(s): VITAMINB12, FOLATE, FERRITIN, TIBC, IRON, RETICCTPCT in the last 72 hours.  Coagulation profile No results for input(s): INR, PROTIME in the last 168 hours.  No results for input(s): DDIMER in the last 72 hours.  Cardiac Enzymes No results for input(s): CKMB, TROPONINI, MYOGLOBIN in the last 168 hours.  Invalid input(s): CK ------------------------------------------------------------------------------------------------------------------ No results found for: BNP  Micro Results No results found for this or any previous visit (from the past 240 hour(s)).  Radiology Reports DG CHEST PORT 1 VIEW  Result Date: 07/31/2020 CLINICAL DATA:  Hypoxia. EXAM: PORTABLE CHEST 1 VIEW COMPARISON:  07/29/2020. FINDINGS: Mediastinum hilar structures normal. Heart size normal. Prominent bibasilar infiltrates consistent with bibasilar pneumonia and or edema noted on today's exam. Tiny bilateral pleural effusions. No pneumothorax. IMPRESSION: Prominent bibasilar infiltrates consistent with bibasilar pneumonia and or edema noted on today's exam. Tiny bilateral pleural effusions. Electronically Signed   By: Marcello Moores  Register   On: 07/31/2020 05:52   Portable chest x-ray (1 view)  Result Date: 07/29/2020 CLINICAL DATA:  Shortness of breath, hypoxia EXAM: PORTABLE CHEST 1 VIEW COMPARISON:  07/28/2020 FINDINGS: Mild left lower lobe opacity, suspicious for pneumonia. Mild right basilar opacity, possibly atelectasis. No frank interstitial edema. No pleural effusion or pneumothorax. IMPRESSION: Mild left lower lobe opacity, suspicious for pneumonia. Electronically Signed   By: Julian Hy M.D.   On: 07/29/2020 13:14   DG Chest Portable 1 View  Result Date: 07/28/2020 CLINICAL DATA:  Shortness of breath EXAM: PORTABLE CHEST 1 VIEW COMPARISON:  11/10/2019 FINDINGS: Heart and mediastinal contours are within normal limits. No focal opacities or effusions. No acute bony abnormality. IMPRESSION: No active  disease. Electronically Signed   By: Rolm Baptise M.D.   On: 07/28/2020 19:40   DG Abd Portable 1V  Result Date: 08/03/2020 CLINICAL DATA:  Abdominal distension. EXAM: PORTABLE ABDOMEN - 1 VIEW COMPARISON:  07/29/2020 FINDINGS: Bowel gas pattern is nonobstructive. No free peritoneal air. Minimal degenerate change of the spine and hips. IMPRESSION: Nonobstructive bowel gas pattern. Electronically Signed   By: Marin Olp M.D.   On: 08/03/2020 16:01   DG Abd Portable 1V  Result Date: 07/29/2020 CLINICAL DATA:  Attempt at OG tube. EXAM: PORTABLE ABDOMEN - 1 VIEW COMPARISON:  Radiograph earlier today. FINDINGS: The enteric tube projects over the midline of the chest, tip at the level of the carina. Progressive bibasilar opacities since chest radiograph earlier today. IMPRESSION: 1. Enteric tube projecting over the midline of the chest, tip at the level of the carina. Recommend repositioning. 2. Progressive bibasilar opacities since chest  radiograph earlier today, likely COVID pneumonia. Electronically Signed   By: Keith Rake M.D.   On: 07/29/2020 22:04   VAS [place] LOWER EXTREMITY VENOUS (DVT)  Result Date: 07/29/2020  Lower Venous DVT Study Other Indications: Covid+ hypoxia. Performing Technologist: Rogelia Rohrer  Examination Guidelines: A complete evaluation includes B-mode imaging, spectral Doppler, color Doppler, and power Doppler as needed of all accessible portions of each vessel. Bilateral testing is considered an integral part of a complete examination. Limited examinations for reoccurring indications may be performed as noted. The reflux portion of the exam is performed with the patient in reverse Trendelenburg.  +---------+---------------+---------+-----------+----------+--------------+ RIGHT    CompressibilityPhasicitySpontaneityPropertiesThrombus Aging +---------+---------------+---------+-----------+----------+--------------+ CFV      Full           Yes      Yes                                  +---------+---------------+---------+-----------+----------+--------------+ SFJ      Full                                                        +---------+---------------+---------+-----------+----------+--------------+ FV Prox  Full           Yes      Yes                                 +---------+---------------+---------+-----------+----------+--------------+ FV Mid   Full           Yes      Yes                                 +---------+---------------+---------+-----------+----------+--------------+ FV DistalFull           Yes      Yes                                 +---------+---------------+---------+-----------+----------+--------------+ PFV      Full                                                        +---------+---------------+---------+-----------+----------+--------------+ POP      Full           Yes      Yes                                 +---------+---------------+---------+-----------+----------+--------------+ PTV      Full                                                        +---------+---------------+---------+-----------+----------+--------------+ PERO     Full                                                        +---------+---------------+---------+-----------+----------+--------------+   +---------+---------------+---------+-----------+----------+--------------+  LEFT     CompressibilityPhasicitySpontaneityPropertiesThrombus Aging +---------+---------------+---------+-----------+----------+--------------+ CFV      Full           Yes      Yes                                 +---------+---------------+---------+-----------+----------+--------------+ SFJ      Full                                                        +---------+---------------+---------+-----------+----------+--------------+ FV Prox  Full           Yes      Yes                                  +---------+---------------+---------+-----------+----------+--------------+ FV Mid   Full           Yes      Yes                                 +---------+---------------+---------+-----------+----------+--------------+ FV DistalFull           Yes      Yes                                 +---------+---------------+---------+-----------+----------+--------------+ PFV      Full                                                        +---------+---------------+---------+-----------+----------+--------------+ POP      Full           Yes      Yes                                 +---------+---------------+---------+-----------+----------+--------------+ PTV      Full                                                        +---------+---------------+---------+-----------+----------+--------------+ PERO     Full                                                        +---------+---------------+---------+-----------+----------+--------------+     Summary: BILATERAL: - No evidence of deep vein thrombosis seen in the lower extremities, bilaterally. - RIGHT: - No cystic structure found in the popliteal fossa.  LEFT: - No cystic structure found in the popliteal fossa.  *See table(s) above for measurements and observations. Electronically signed by Heath Larkhomas Hawken on 07/29/2020 at 7:25:56 PM.    Final  ECHOCARDIOGRAM LIMITED  Result Date: 07/29/2020    ECHOCARDIOGRAM LIMITED REPORT   Patient Name:   CATLYNN GRONDAHL Date of Exam: 07/29/2020 Medical Rec #:  [id number]     Height:       66.0 in Accession #:    [id number]    Weight:       123.9 lb Date of Birth:  26-Oct-1960     BSA:          1.632 m Patient Age:    57 years      BP:           137/77 mmHg Patient Gender: F             HR:           155 bpm. Exam Location:  Inpatient Procedure: Limited Echo and Cardiac Doppler Indications:    R06.02 SOB  History:        Patient has prior history of Echocardiogram examinations, most                  recent 07/01/2019. Abnormal ECG, Signs/Symptoms:Syncope; Risk                 Factors:Diabetes. Covid positive.  Sonographer:    Roseanna Rainbow RDCS Referring Phys: [id number] Silvestre Moment [facility]  Sonographer Comments: Heart rate is very high despite meds. IMPRESSIONS  1. Left ventricular ejection fraction, by estimation, is 65 to 70%. The left ventricle has normal function. The left ventricle has no regional wall motion abnormalities.  2. The mitral valve is normal in structure.  3. The aortic valve is normal in structure.  4. There is mildly elevated pulmonary artery systolic pressure. The estimated right ventricular systolic pressure is 89.3 mmHg.  5. The inferior vena cava is normal in size with <50% respiratory variability, suggesting right atrial pressure of 8 mmHg. FINDINGS  Left Ventricle: Left ventricular ejection fraction, by estimation, is 65 to 70%. The left ventricle has normal function. The left ventricle has no regional wall motion abnormalities. Right Ventricle: There is mildly elevated pulmonary artery systolic pressure. The tricuspid regurgitant velocity is 2.66 m/s, and with an assumed right atrial pressure of 8 mmHg, the estimated right ventricular systolic pressure is 81.0 mmHg. Mitral Valve: The mitral valve is normal in structure. Tricuspid Valve: The tricuspid valve is normal in structure. Tricuspid valve regurgitation is mild. Aortic Valve: The aortic valve is normal in structure. Venous: The inferior vena cava is normal in size with less than 50% respiratory variability, suggesting right atrial pressure of 8 mmHg. IAS/Shunts: The interatrial septum appears to be lipomatous. LEFT VENTRICLE PLAX 2D LVIDd:         3.10 cm LVIDs:         1.70 cm LV PW:         0.90 cm LV IVS:        1.00 cm LVOT diam:     1.60 cm LVOT Area:     2.01 cm  LV Volumes (MOD) LV vol d, MOD A2C: 47.2 ml LV vol d, MOD A4C: 40.2 ml LV vol s, MOD A2C: 14.6 ml LV vol s, MOD A4C: 16.4 ml LV SV MOD A2C:     32.6 ml LV SV MOD A4C:     40.2  ml LV SV MOD BP:      29.4 ml IVC IVC diam: 2.00 cm LEFT ATRIUM         Index LA diam:    2.30 cm 1.41  cm/m   AORTA Ao Root diam: 2.60 cm TRICUSPID VALVE TR Peak grad:   28.3 mmHg TR Vmax:        266.00 cm/s  SHUNTS Systemic Diam: 1.60 cm Fransico Him MD Electronically signed by Fransico Him MD Signature Date/Time: 07/29/2020/5:07:56 PM    Final

## 2020-08-12 ENCOUNTER — Other Ambulatory Visit (HOSPITAL_COMMUNITY): Payer: Self-pay | Admitting: Internal Medicine

## 2020-08-12 DIAGNOSIS — E1143 Type 2 diabetes mellitus with diabetic autonomic (poly)neuropathy: Secondary | ICD-10-CM

## 2020-08-12 DIAGNOSIS — N179 Acute kidney failure, unspecified: Secondary | ICD-10-CM

## 2020-08-12 DIAGNOSIS — Z794 Long term (current) use of insulin: Secondary | ICD-10-CM

## 2020-08-12 LAB — GLUCOSE, CAPILLARY
Glucose-Capillary: 124 mg/dL — ABNORMAL HIGH (ref 70–99)
Glucose-Capillary: 206 mg/dL — ABNORMAL HIGH (ref 70–99)
Glucose-Capillary: 173 mg/dL — ABNORMAL HIGH (ref 70–99)
Glucose-Capillary: 149 mg/dL — ABNORMAL HIGH (ref 70–99)

## 2020-08-12 MED ORDER — GABAPENTIN 600 MG PO TABS: ORAL_TABLET | ORAL | 0 refills | Status: DC

## 2020-08-12 MED ORDER — HYDROCORTISONE 10 MG PO TABS: 10.0000 mg | ORAL_TABLET | Freq: Every day | ORAL | 0 refills | Status: DC

## 2020-08-12 MED ORDER — PEN NEEDLES 31G X 8 MM MISC: 6 refills | Status: AC

## 2020-08-12 MED ORDER — DULOXETINE HCL 60 MG PO CPEP: 60.0000 mg | ORAL_CAPSULE | Freq: Two times a day (BID) | ORAL | 0 refills | Status: DC

## 2020-08-12 MED ORDER — HYDROCORTISONE 20 MG PO TABS: 20.0000 mg | ORAL_TABLET | Freq: Every morning | ORAL | 0 refills | Status: AC

## 2020-08-12 MED ORDER — GLUCERNA SHAKE PO LIQD: 237.0000 mL | Freq: Two times a day (BID) | ORAL | 0 refills | Status: AC

## 2020-08-12 MED ORDER — HUMALOG KWIKPEN 100 UNIT/ML ~~LOC~~ SOPN: 10.0000 [IU] | PEN_INJECTOR | Freq: Three times a day (TID) | SUBCUTANEOUS | 0 refills | Status: DC

## 2020-08-12 MED ORDER — MIDODRINE HCL 5 MG PO TABS: 5.0000 mg | ORAL_TABLET | Freq: Three times a day (TID) | ORAL | 0 refills | Status: DC

## 2020-08-12 MED ORDER — LANTUS SOLOSTAR 100 UNIT/ML ~~LOC~~ SOPN: 26.0000 [IU] | PEN_INJECTOR | Freq: Every day | SUBCUTANEOUS | 0 refills | Status: DC

## 2020-08-12 MED FILL — HYDROCORTISONE 10 MG TABLET: 10 | 30 days supply | Qty: 30 | Fill #0

## 2020-08-12 MED FILL — HYDROCORTISONE 20 MG TABLET: 20 | 30 days supply | Qty: 30 | Fill #0

## 2020-08-12 MED FILL — DULoxetine HCL 60 MG CPEP: 60 | 30 days supply | Qty: 60 | Fill #0

## 2020-08-12 MED FILL — LANTUS SOLOSTAR 100 UNITS/M: 100 | 30 days supply | Qty: 9 | Fill #0

## 2020-08-12 MED FILL — GABAPENTIN 600 MG TABLET: 600 | 30 days supply | Qty: 120 | Fill #0

## 2020-08-12 MED FILL — HUMALOG 100 UNITS/ML KWIKPE: 100 | 30 days supply | Qty: 9 | Fill #0

## 2020-08-12 MED FILL — MIDODRINE HCL 5 MG TABS: 5 | 30 days supply | Qty: 90 | Fill #0

## 2020-08-12 MED FILL — PENTIPS 31G X 8 MM MISC: 31G X 8 MM | 30 days supply | Qty: 100 | Fill #0

## 2020-08-12 NOTE — Progress Notes (Signed)
Pt discharged home with son who came to pick her up.Wheelchair, home medication and discharge instruction given and reviewed. Iv removed no distress noted.

## 2020-08-12 NOTE — Progress Notes (Signed)
Physical Therapy Treatment Patient Details Name: [name] [name] MRN: [id number] DOB: [birth year] Today's Date: 08/12/2020    History of Present Illness Pt is a 60 y.o. female admitted 07/28/20 with SOB, fatigue and poor p.o. intake; workup for DKA, acute hypoxic respiratory failure due to COVID-19 PNA. PMH includes DM2, HTN, chronic pain syndrome, multiple falls (fall s/p R ankle ORIF 02/25/20).    PT Comments    Continuing work on functional mobility and activity tolerance;  Notable VERY NICE improvements in gait stability, endurance and activity tolerance; Good use and control of rollator RW; session conducted on Room Air and O2 sats remained greater tahn or equal to 88%; OK for dc home from PT standpoint   Follow Up Recommendations  Supervision - Intermittent (Outpt PT follow up worth considering, however pt's insurance status may not support it; This can also be addressed with PCP follow up)     Equipment Recommendations  Other (comment) (Rollator RW)    Recommendations for Other Services       Precautions / Restrictions Precautions Precautions: Fall Precaution Comments: No symptoms of hypotension noted    Mobility  Bed Mobility                  Transfers Overall transfer level: Needs assistance Equipment used: 4-wheeled walker Transfers: Sit to/from Stand Sit to Stand: Supervision         General transfer comment: Cues to self-monitor for activity tolerance  Ambulation/Gait Ambulation/Gait assistance: Min guard;Supervision Gait Distance (Feet): 40 Feet (including one seated rest break, using Rollator) Assistive device: 4-wheeled walker Gait Pattern/deviations: Step-through pattern     General Gait Details: Much improved posture compared to last session; Managing Rollator well, including brakes and turning; cues to self-monitor for activity tolerance   Stairs             Wheelchair Mobility    Modified Rankin (Stroke Patients Only)        Balance     Sitting balance-Leahy Scale: Good       Standing balance-Leahy Scale: Fair                              Cognition Arousal/Alertness: Awake/alert Behavior During Therapy: WFL for tasks assessed/performed Overall Cognitive Status: Within Functional Limits for tasks assessed                                 General Comments: appears WNL      Exercises      General Comments General comments (skin integrity, edema, etc.): Session conducted on room air and O2 sats decr to 88% observed lowest (while pt was talkative); recovered well to mid 90s with focused breathing      Pertinent Vitals/Pain Pain Assessment: No/denies pain    Home Living                      Prior Function            PT Goals (current goals can now be found in the care plan section) Acute Rehab PT Goals Patient Stated Goal: Would like to eat lunch before going home PT Goal Formulation: With patient Time For Goal Achievement: 08/16/20 Potential to Achieve Goals: Good Progress towards PT goals: Progressing toward goals    Frequency    Min 3X/week      PT Plan Discharge plan needs  to be updated    Co-evaluation              AM-PAC PT "6 Clicks" Mobility   Outcome Measure  Help needed turning from your back to your side while in a flat bed without using bedrails?: None Help needed moving from lying on your back to sitting on the side of a flat bed without using bedrails?: None Help needed moving to and from a bed to a chair (including a wheelchair)?: None Help needed standing up from a chair using your arms (e.g., wheelchair or bedside chair)?: None Help needed to walk in hospital room?: None Help needed climbing 3-5 steps with a railing? : A Little 6 Click Score: 23    End of Session   Activity Tolerance: Patient tolerated treatment well Patient left: in chair;with call bell/phone within reach   PT Visit Diagnosis: Other abnormalities  of gait and mobility (R26.89);Muscle weakness (generalized) (M62.81)     Time: 2725-3664 PT Time Calculation (min) (ACUTE ONLY): 11 min  Charges:  $Gait Training: 8-22 mins                     Roney Marion, [place]  Acute Rehabilitation Services Pager [phone] Office [phone]    [place]ta [place] 08/12/2020, 4:18 PM

## 2020-08-12 NOTE — Discharge Summary (Signed)
PATIENT DETAILS Name: [name] [name] Age: 60 y.o. Sex: female Date of Birth: [birth year] MRN: [id number]. Admitting Physician: Lanier Clam, MD FFM:[id number], Ander Gaster, MD (Inactive)  Admit Date: 07/28/2020 Discharge date: 08/12/2020  Recommendations for Outpatient Follow-up:  1. Follow up with PCP in 1-2 weeks 2. Please obtain CMP/CBC in one week  Admitted From:  Home  Disposition: Home with home health services   [place]: Yes  Equipment/Devices: None  Discharge Condition: Stable  CODE STATUS: FULL CODE  Diet recommendation:  Diet Order            Diet Carb Modified           Diet Carb Modified Fluid consistency: Thin; Room service appropriate? Yes  Diet effective now                  Brief Narrative: Patient is a 60 y.o. female with PMHx of DM-2, HTN, adrenal insufficiency, peripheral neuropathy, chronic back pain admitted by PCCM to the ICU for DKA and COVID-19 pneumonia.  Upon stability-transfer to the [facility] service.   COVID-19 vaccinated status:   Significant Events: 1/9>> Admit to ICU for DKA/hypoxia due to COVID-19 pneumonia 1/13>> transfer to [facility]  Significant studies: 1/9>> chest x-ray: No active disease 1/10>> bilateral lower extremity Doppler: No DVT. 1/10>> Echo: EF 65-70%. 1/10>> chest x-ray: Mild left lower lobe opacity 1/10>> portable abdomen x-ray: Enteric tube projecting over the midline, progressive bibasilar opacities. 1/12>> chest x-ray: Bibasilar pneumonia 1/15>> x-ray abdomen portable: Nonobstructive bowel gas pattern.  COVID-19 medications: Steroids: 1/10>> Baricitinib: 1/11>> 1/13  Antibiotics: Rocephin: 1/10 >> 1/14  Zithromax: 1/10 >> 1/14   Microbiology data: 1/10 >>blood culture: No growth  Procedures: None  Consults: PCCM  Brief Hospital [name]: Acute Hypoxic Resp Failure due to Covid 19 Viral pneumonia:  Significantly improved-on room air for the past several days-treated with a  [name] of steroids and baricitinib  COVID-19 Labs:  No results for input(s): DDIMER, FERRITIN, LDH, CRP in the last 72 hours.  Lab Results  Component Value Date   SARSCOV2NAA POSITIVE (A) 07/28/2020   [place] NEGATIVE 02/25/2020   [place] NEGATIVE 12/08/2019   [place] NEGATIVE 09/06/2019     DKA: Resolved-required insulin infusion on admission.  Insulin-dependent DM-2 (A1c 12.3 on 1/10): CBG reasonably well controlled better-plans are to continue with Lantus 26 units daily, 10 units of NovoLog with meals-and have her follow-up with primary care practitioner for further optimization.   Recent Labs    08/11/20 1812 08/11/20 1955 08/12/20 0738  GLUCAP 206* 197* 124*    Orthostatic hypotension: Likely due to combination of underlying adrenal insufficiency and diarrhea.   Probably has some amount of chronic autonomic neuropathy in the setting of diabetes as well.   Per patient-this is been a chronic issue for the past several years.  Continue hydrocortisone and midodrine on discharge.  Continue compression stockings on discharge.  Still orthostatic but less symptomatic on the day of discharge.   Continue to follow-up with PCP.  History of adrenal insufficiency: Per patient-this was diagnosed 2-3 years ago-she was initially apparently on steroids but this was discontinued for hyperglycemia and has been maintained on Florinef.  Given stressful situation with this acute hospitalization-with numerous medical issues as outlined above-suspect she needs glucocorticoids and just not mineralocorticoids-hence she was switched to hydrocortisone.    Please follow-up with PCP for further continue to of care.  Diarrhea:  resolved with use of Florastor and as needed Imodium.  Peripheral neuropathy: Continue Cymbalta/gabapentin  Deconditioning/generalized weakness: Secondary to acute illness-PT recommending SNF-social work following.  Per patient-at baseline-prior to this  hospitalization-she was just about able to stand up-and only ambulate by holding onto things/wall around the house.  She has a walker/wheelchair but was not using it consistently.  This morning-patient is requesting discharge home-she feels better and she is apparently close to baseline-and wants to go home-she has family with her 24/7.  She does not want to go to SNF.  I have asked case worker to see if we can provide some home health resources.   Discharge Diagnoses:  Active Problems:   DKA (diabetic ketoacidosis) [facility])   Discharge Instructions:    Person Under Monitoring Name: [name] [name]  Location: [place] [address]   Infection Prevention Recommendations for Individuals Confirmed to have, or Being Evaluated for, 2019 Novel Coronavirus (COVID-19) Infection Who Receive Care at Home  Individuals who are confirmed to have, or are being evaluated for, COVID-19 should follow the prevention steps below until a healthcare provider or local or state health department says they can return to normal activities.  Stay home except to get medical care You should restrict activities outside your home, except for getting medical care. Do not go to work, school, or public areas, and do not use public transportation or taxis.  Call ahead before visiting your doctor Before your medical appointment, call the healthcare provider and tell them that you have, or are being evaluated for, COVID-19 infection. This will help the healthcare provider's office take steps to keep other people from getting infected. Ask your healthcare provider to call the local or state health department.  Monitor your symptoms Seek prompt medical attention if your illness is worsening (e.g., difficulty breathing). Before going to your medical appointment, call the healthcare provider and tell them that you have, or are being evaluated for, COVID-19 infection. Ask your healthcare provider to  call the local or state health department.  Wear a facemask You should wear a facemask that covers your nose and mouth when you are in the same room with other people and when you visit a healthcare provider. People who live with or visit you should also wear a facemask while they are in the same room with you.  Separate yourself from other people in your home As much as possible, you should stay in a different room from other people in your home. Also, you should use a separate bathroom, if available.  Avoid sharing household items You should not share dishes, drinking glasses, cups, eating utensils, towels, bedding, or other items with other people in your home. After using these items, you should wash them thoroughly with soap and water.  Cover your coughs and sneezes Cover your mouth and nose with a tissue when you cough or sneeze, or you can cough or sneeze into your sleeve. Throw used tissues in a lined trash can, and immediately wash your hands with soap and water for at least 20 seconds or use an alcohol-based hand rub.  Wash your [facility] your hands often and thoroughly with soap and water for at least 20 seconds. You can use an alcohol-based hand sanitizer if soap and water are not available and if your hands are not visibly dirty. Avoid touching your eyes, nose, and mouth with unwashed hands.   Prevention Steps for Caregivers and Household Members of Individuals Confirmed to have, or Being Evaluated for, COVID-19 Infection Being Cared for in the Home  If you  live with, or provide care at home for, a person confirmed to have, or being evaluated for, COVID-19 infection please follow these guidelines to prevent infection:  Follow healthcare provider's instructions Make sure that you understand and can help the patient follow any healthcare provider instructions for all care.  Provide for the patient's basic needs You should help the patient with basic needs in the home  and provide support for getting groceries, prescriptions, and other personal needs.  Monitor the patient's symptoms If they are getting sicker, call his or her medical provider and tell them that the patient has, or is being evaluated for, COVID-19 infection. This will help the healthcare provider's office take steps to keep other people from getting infected. Ask the healthcare provider to call the local or state health department.  Limit the number of people who have contact with the patient  If possible, have only one caregiver for the patient.  Other household members should stay in another home or place of residence. If this is not possible, they should stay  in another room, or be separated from the patient as much as possible. Use a separate bathroom, if available.  Restrict visitors who do not have an essential need to be in the home.  Keep older adults, very young children, and other sick people away from the patient Keep older adults, very young children, and those who have compromised immune systems or chronic health conditions away from the patient. This includes people with chronic heart, lung, or kidney conditions, diabetes, and cancer.  Ensure good ventilation Make sure that shared spaces in the home have good air flow, such as from an air conditioner or an opened window, weather permitting.  Wash your hands often  Wash your hands often and thoroughly with soap and water for at least 20 seconds. You can use an alcohol based hand sanitizer if soap and water are not available and if your hands are not visibly dirty.  Avoid touching your eyes, nose, and mouth with unwashed hands.  Use disposable paper towels to dry your hands. If not available, use dedicated cloth towels and replace them when they become wet.  Wear a facemask and gloves  Wear a disposable facemask at all times in the room and gloves when you touch or have contact with the patient's blood, body fluids,  and/or secretions or excretions, such as sweat, saliva, sputum, nasal mucus, vomit, urine, or feces.  Ensure the mask fits over your nose and mouth tightly, and do not touch it during use.  Throw out disposable facemasks and gloves after using them. Do not reuse.  Wash your hands immediately after removing your facemask and gloves.  If your personal clothing becomes contaminated, carefully remove clothing and launder. Wash your hands after handling contaminated clothing.  Place all used disposable facemasks, gloves, and other waste in a lined container before disposing them with other household waste.  Remove gloves and wash your hands immediately after handling these items.  Do not share dishes, glasses, or other household items with the patient  Avoid sharing household items. You should not share dishes, drinking glasses, cups, eating utensils, towels, bedding, or other items with a patient who is confirmed to have, or being evaluated for, COVID-19 infection.  After the person uses these items, you should wash them thoroughly with soap and water.  Wash laundry thoroughly  Immediately remove and wash clothes or bedding that have blood, body fluids, and/or secretions or excretions, such as sweat, saliva, sputum,  nasal mucus, vomit, urine, or feces, on them.  Wear gloves when handling laundry from the patient.  Read and follow directions on labels of laundry or clothing items and detergent. In general, wash and dry with the warmest temperatures recommended on the label.  Clean all areas the individual has used often  Clean all touchable surfaces, such as counters, tabletops, doorknobs, bathroom fixtures, toilets, phones, keyboards, tablets, and bedside tables, every day. Also, clean any surfaces that may have blood, body fluids, and/or secretions or excretions on them.  Wear gloves when cleaning surfaces the patient has come in contact with.  Use a diluted bleach solution (e.g., dilute  bleach with 1 part bleach and 10 parts water) or a household disinfectant with a label that says EPA-registered for coronaviruses. To make a bleach solution at home, add 1 tablespoon of bleach to 1 quart (4 cups) of water. For a larger supply, add  cup of bleach to 1 gallon (16 cups) of water.  Read labels of cleaning products and follow recommendations provided on product labels. Labels contain instructions for safe and effective use of the cleaning product including precautions you should take when applying the product, such as wearing gloves or eye protection and making sure you have good ventilation during use of the product.  Remove gloves and wash hands immediately after cleaning.  Monitor yourself for signs and symptoms of illness Caregivers and household members are considered close contacts, should monitor their health, and will be asked to limit movement outside of the home to the extent possible. Follow the monitoring steps for close contacts listed on the symptom monitoring form.   ? If you have additional questions, contact your local health department or call the epidemiologist on call at [phone] (available 24/7). ? This guidance is subject to change. For the most up-to-date guidance from CDC, please refer to their website: YouBlogs.pl    Activity:  As tolerated with Full fall precautions use walker/cane & assistance as needed Discharge Instructions    Call MD for:  difficulty breathing, headache or visual disturbances   Complete by: As directed    Diet Carb Modified   Complete by: As directed    Discharge instructions   Complete by: As directed    1.)  21 days of isolation from 07/28/2020  2.)  If you develop worsening shortness of breath-please seek immediate medical attention  3)  Please check your sugars multiple times a day and keep a record of these readings-and take it with you to your next  appointment with your PCP.  4.)  Please ask your primary care practitioner to repeat two-view chest x-ray in 4 to 6 weeks.   Follow with Primary MD  Antony Blackbird, MD (Inactive) in 1-2 weeks  Please get a complete blood count and chemistry panel checked by your Primary MD at your next visit, and again as instructed by your Primary MD.  Get Medicines reviewed and adjusted: Please take all your medications with you for your next visit with your Primary MD  Laboratory/radiological data: Please request your Primary MD to go over all hospital tests and procedure/radiological results at the follow up, please ask your Primary MD to get all Hospital records sent to his/her office.  In some cases, they will be blood work, cultures and biopsy results pending at the time of your discharge. Please request that your primary care M.D. follows up on these results.  Also Note the following: If you experience worsening of your admission symptoms, develop  shortness of breath, life threatening emergency, suicidal or homicidal thoughts you must seek medical attention immediately by calling 911 or calling your MD immediately  if symptoms less severe.  You must read complete instructions/literature along with all the possible adverse reactions/side effects for all the Medicines you take and that have been prescribed to you. Take any new Medicines after you have completely understood and accpet all the possible adverse reactions/side effects.   Do not drive when taking Pain medications or sleeping medications (Benzodaizepines)  Do not take more than prescribed Pain, Sleep and Anxiety Medications. It is not advisable to combine anxiety,sleep and pain medications without talking with your primary care practitioner  Special Instructions: If you have smoked or chewed Tobacco  in the last 2 yrs please stop smoking, stop any regular Alcohol  and or any Recreational drug use.  Wear Seat belts while driving.  Please  note: You were cared for by a hospitalist during your hospital stay. Once you are discharged, your primary care physician will handle any further medical issues. Please note that NO REFILLS for any discharge medications will be authorized once you are discharged, as it is imperative that you return to your primary care physician (or establish a relationship with a primary care physician if you do not have one) for your post hospital discharge needs so that they can reassess your need for medications and monitor your lab values.   Increase activity slowly   Complete by: As directed      Allergies as of 08/12/2020      Reactions   Tramadol Nausea And Vomiting   REACTION: Projectile vomiting      Medication List    STOP taking these medications   atorvastatin 20 MG tablet Commonly known as: LIPITOR   fludrocortisone 0.1 MG tablet Commonly known as: FLORINEF   Glucosamine Sulfate 1000 MG Caps   meloxicam 15 MG tablet Commonly known as: MOBIC     TAKE these medications   acetaminophen 500 MG tablet Commonly known as: TYLENOL Take 2 tablets (1,000 mg total) by mouth every 6 (six) hours as needed for mild pain or headache (pain).   Centrum Silver 50+Men Tabs Take 1 tablet by mouth daily.   diclofenac Sodium 1 % Gel Commonly known as: Voltaren Apply 1 application topically 4 (four) times daily.   DULoxetine 60 MG capsule Commonly known as: CYMBALTA Take 1 capsule (60 mg total) by mouth 2 (two) times daily.   feeding supplement (GLUCERNA SHAKE) Liqd Take 237 mLs by mouth 2 (two) times daily between meals.   gabapentin 600 MG tablet Commonly known as: NEURONTIN TAKE ONE TABLET (600 MG) BY MOUTH EVERY MORNING AND AFTERNOON, TAKE TWO TABLETS AT NIGHT What changed:   how much to take  how to take this  when to take this   HumaLOG KwikPen 100 UNIT/ML KwikPen Generic drug: insulin lispro Inject 10 Units into the skin 3 (three) times daily. What changed: how much to take    hydrocortisone 10 MG tablet Commonly known as: CORTEF Take 1 tablet (10 mg total) by mouth daily at 10 pm.   hydrocortisone 20 MG tablet Commonly known as: CORTEF Take 1 tablet (20 mg total) by mouth every morning. Start taking on: August 13, 2020   Lantus SoloStar 100 UNIT/ML Solostar Pen Generic drug: insulin glargine Inject 26 Units into the skin daily. What changed: how much to take   midodrine 5 MG tablet Commonly known as: PROAMATINE Take 1 tablet (5 mg total)  by mouth 3 (three) times daily with meals.   Pen Needles 31G X 8 MM Misc Use as directed   True Metrix Blood Glucose Test test strip Generic drug: glucose blood Use as instructed to check blood sugar once daily.   True Metrix Meter w/Device Kit Use as instructed to check blood sugar once daily.   Vitamin D-3 125 MCG (5000 UT) Tabs Take 1 tablet by mouth daily.       Follow-up [facility] Follow up on 08/28/2020.   Why: @ 9:30 am with Dr. Margarita Rana for hospital follow up appointment. Office will discuss the orange card with the patient.  Contact information: [place] [phone]             Allergies  Allergen Reactions  . Tramadol Nausea And Vomiting    REACTION: Projectile vomiting     Other Procedures/Studies: DG CHEST PORT 1 VIEW  Result Date: 07/31/2020 CLINICAL DATA:  Hypoxia. EXAM: PORTABLE CHEST 1 VIEW COMPARISON:  07/29/2020. FINDINGS: Mediastinum hilar structures normal. Heart size normal. Prominent bibasilar infiltrates consistent with bibasilar pneumonia and or edema noted on today's exam. Tiny bilateral pleural effusions. No pneumothorax. IMPRESSION: Prominent bibasilar infiltrates consistent with bibasilar pneumonia and or edema noted on today's exam. Tiny bilateral pleural effusions. Electronically Signed   By: Marcello Moores  Register   On: 07/31/2020 05:52   Portable chest x-ray (1 view)  Result  Date: 07/29/2020 CLINICAL DATA:  Shortness of breath, hypoxia EXAM: PORTABLE CHEST 1 VIEW COMPARISON:  07/28/2020 FINDINGS: Mild left lower lobe opacity, suspicious for pneumonia. Mild right basilar opacity, possibly atelectasis. No frank interstitial edema. No pleural effusion or pneumothorax. IMPRESSION: Mild left lower lobe opacity, suspicious for pneumonia. Electronically Signed   By: Julian Hy M.D.   On: 07/29/2020 13:14   DG Chest Portable 1 View  Result Date: 07/28/2020 CLINICAL DATA:  Shortness of breath EXAM: PORTABLE CHEST 1 VIEW COMPARISON:  11/10/2019 FINDINGS: Heart and mediastinal contours are within normal limits. No focal opacities or effusions. No acute bony abnormality. IMPRESSION: No active disease. Electronically Signed   By: Rolm Baptise M.D.   On: 07/28/2020 19:40   DG Abd Portable 1V  Result Date: 08/03/2020 CLINICAL DATA:  Abdominal distension. EXAM: PORTABLE ABDOMEN - 1 VIEW COMPARISON:  07/29/2020 FINDINGS: Bowel gas pattern is nonobstructive. No free peritoneal air. Minimal degenerate change of the spine and hips. IMPRESSION: Nonobstructive bowel gas pattern. Electronically Signed   By: Marin Olp M.D.   On: 08/03/2020 16:01   DG Abd Portable 1V  Result Date: 07/29/2020 CLINICAL DATA:  Attempt at OG tube. EXAM: PORTABLE ABDOMEN - 1 VIEW COMPARISON:  Radiograph earlier today. FINDINGS: The enteric tube projects over the midline of the chest, tip at the level of the carina. Progressive bibasilar opacities since chest radiograph earlier today. IMPRESSION: 1. Enteric tube projecting over the midline of the chest, tip at the level of the carina. Recommend repositioning. 2. Progressive bibasilar opacities since chest radiograph earlier today, likely COVID pneumonia. Electronically Signed   By: Keith Rake M.D.   On: 07/29/2020 22:04   VAS [place] LOWER EXTREMITY VENOUS (DVT)  Result Date: 07/29/2020  Lower Venous DVT Study Other Indications: Covid+ hypoxia. Performing  Technologist: Rogelia Rohrer  Examination Guidelines: A complete evaluation includes B-mode imaging, spectral Doppler, color Doppler, and power Doppler as needed of all accessible portions of each vessel. Bilateral testing is considered an integral part of a  complete examination. Limited examinations for reoccurring indications may be performed as noted. The reflux portion of the exam is performed with the patient in reverse Trendelenburg.  +---------+---------------+---------+-----------+----------+--------------+ RIGHT    CompressibilityPhasicitySpontaneityPropertiesThrombus Aging +---------+---------------+---------+-----------+----------+--------------+ CFV      Full           Yes      Yes                                 +---------+---------------+---------+-----------+----------+--------------+ SFJ      Full                                                        +---------+---------------+---------+-----------+----------+--------------+ FV Prox  Full           Yes      Yes                                 +---------+---------------+---------+-----------+----------+--------------+ FV Mid   Full           Yes      Yes                                 +---------+---------------+---------+-----------+----------+--------------+ FV DistalFull           Yes      Yes                                 +---------+---------------+---------+-----------+----------+--------------+ PFV      Full                                                        +---------+---------------+---------+-----------+----------+--------------+ POP      Full           Yes      Yes                                 +---------+---------------+---------+-----------+----------+--------------+ PTV      Full                                                        +---------+---------------+---------+-----------+----------+--------------+ PERO     Full                                                         +---------+---------------+---------+-----------+----------+--------------+   +---------+---------------+---------+-----------+----------+--------------+ LEFT     CompressibilityPhasicitySpontaneityPropertiesThrombus Aging +---------+---------------+---------+-----------+----------+--------------+ CFV      Full           Yes      Yes                                 +---------+---------------+---------+-----------+----------+--------------+  SFJ      Full                                                        +---------+---------------+---------+-----------+----------+--------------+ FV Prox  Full           Yes      Yes                                 +---------+---------------+---------+-----------+----------+--------------+ FV Mid   Full           Yes      Yes                                 +---------+---------------+---------+-----------+----------+--------------+ FV DistalFull           Yes      Yes                                 +---------+---------------+---------+-----------+----------+--------------+ PFV      Full                                                        +---------+---------------+---------+-----------+----------+--------------+ POP      Full           Yes      Yes                                 +---------+---------------+---------+-----------+----------+--------------+ PTV      Full                                                        +---------+---------------+---------+-----------+----------+--------------+ PERO     Full                                                        +---------+---------------+---------+-----------+----------+--------------+     Summary: BILATERAL: - No evidence of deep vein thrombosis seen in the lower extremities, bilaterally. - RIGHT: - No cystic structure found in the popliteal fossa.  LEFT: - No cystic structure found in the popliteal fossa.  *See table(s) above for measurements  and observations. Electronically signed by Jamelle Haring on 07/29/2020 at 7:25:56 PM.    Final    ECHOCARDIOGRAM LIMITED  Result Date: 07/29/2020    ECHOCARDIOGRAM LIMITED REPORT   Patient Name:   [name] [name] Date of Exam: 07/29/2020 Medical Rec #:  [id number]     Height:       66.0 in Accession #:    [id number]    Weight:       123.9 lb Date of Birth:  November 05, 1960     BSA:          1.632 m Patient Age:    59 years      BP:           137/77 mmHg Patient Gender: F             HR:           155 bpm. Exam Location:  Inpatient Procedure: Limited Echo and Cardiac Doppler Indications:    R06.02 SOB  History:        Patient has prior history of Echocardiogram examinations, most                 recent 07/01/2019. Abnormal ECG, Signs/Symptoms:Syncope; Risk                 Factors:Diabetes. Covid positive.  Sonographer:    Roseanna Rainbow RDCS Referring Phys: [id number]re Moment [facility]  Sonographer Comments: Heart rate is very high despite meds. IMPRESSIONS  1. Left ventricular ejection fraction, by estimation, is 65 to 70%. The left ventricle has normal function. The left ventricle has no regional wall motion abnormalities.  2. The mitral valve is normal in structure.  3. The aortic valve is normal in structure.  4. There is mildly elevated pulmonary artery systolic pressure. The estimated right ventricular systolic pressure is 18.5 mmHg.  5. The inferior vena cava is normal in size with <50% respiratory variability, suggesting right atrial pressure of 8 mmHg. FINDINGS  Left Ventricle: Left ventricular ejection fraction, by estimation, is 65 to 70%. The left ventricle has normal function. The left ventricle has no regional wall motion abnormalities. Right Ventricle: There is mildly elevated pulmonary artery systolic pressure. The tricuspid regurgitant velocity is 2.66 m/s, and with an assumed right atrial pressure of 8 mmHg, the estimated right ventricular systolic pressure is 63.1 mmHg. Mitral Valve: The mitral valve is normal in  structure. Tricuspid Valve: The tricuspid valve is normal in structure. Tricuspid valve regurgitation is mild. Aortic Valve: The aortic valve is normal in structure. Venous: The inferior vena cava is normal in size with less than 50% respiratory variability, suggesting right atrial pressure of 8 mmHg. IAS/Shunts: The interatrial septum appears to be lipomatous. LEFT VENTRICLE PLAX 2D LVIDd:         3.10 cm LVIDs:         1.70 cm LV PW:         0.90 cm LV IVS:        1.00 cm LVOT diam:     1.60 cm LVOT Area:     2.01 cm  LV Volumes (MOD) LV vol d, MOD A2C: 47.2 ml LV vol d, MOD A4C: 40.2 ml LV vol s, MOD A2C: 14.6 ml LV vol s, MOD A4C: 16.4 ml LV SV MOD A2C:     32.6 ml LV SV MOD A4C:     40.2 ml LV SV MOD BP:      29.4 ml IVC IVC diam: 2.00 cm LEFT ATRIUM         Index LA diam:    2.30 cm 1.41 cm/m   AORTA Ao Root diam: 2.60 cm TRICUSPID VALVE TR Peak grad:   28.3 mmHg TR Vmax:        266.00 cm/s  SHUNTS Systemic Diam: 1.60 cm Fransico Him MD Electronically signed by Fransico Him MD Signature Date/Time: 07/29/2020/5:07:56 PM    Final      TODAY-DAY OF DISCHARGE:  Subjective:   [name] [name]  today has no headache,no chest abdominal pain,no new weakness tingling or numbness, feels much better wants to go home today.   Objective:   Blood pressure (!) 109/58, pulse 99, temperature 97.7 F (36.5 C), temperature source Oral, resp. rate 17, height _0  (1.676 m), weight 60.4 kg, SpO2 93 %.  Intake/Output Summary (Last 24 hours) at 08/12/2020 1025 Last data filed at 08/12/2020 0300 Gross per 24 hour  Intake 545.35 ml  Output --  Net 545.35 ml   Filed Weights   08/10/20 0409 08/11/20 0513 08/12/20 0358  Weight: 60.9 kg 60.2 kg 60.4 kg    Exam: Awake Alert, Oriented *3, No new F.N deficits, Normal affect [place].AT,PERRAL Supple Neck,No JVD, No cervical lymphadenopathy appriciated.  Symmetrical Chest wall movement, Good air movement bilaterally, CTAB RRR,No Gallops,Rubs or new Murmurs, No Parasternal  Heave +ve B.Sounds, Abd Soft, Non tender, No organomegaly appriciated, No rebound -guarding or rigidity. No Cyanosis, Clubbing or edema, No new Rash or bruise   PERTINENT RADIOLOGIC STUDIES: DG CHEST PORT 1 VIEW  Result Date: 07/31/2020 CLINICAL DATA:  Hypoxia. EXAM: PORTABLE CHEST 1 VIEW COMPARISON:  07/29/2020. FINDINGS: Mediastinum hilar structures normal. Heart size normal. Prominent bibasilar infiltrates consistent with bibasilar pneumonia and or edema noted on today's exam. Tiny bilateral pleural effusions. No pneumothorax. IMPRESSION: Prominent bibasilar infiltrates consistent with bibasilar pneumonia and or edema noted on today's exam. Tiny bilateral pleural effusions. Electronically Signed   By: Marcello Moores  Register   On: 07/31/2020 05:52   Portable chest x-ray (1 view)  Result Date: 07/29/2020 CLINICAL DATA:  Shortness of breath, hypoxia EXAM: PORTABLE CHEST 1 VIEW COMPARISON:  07/28/2020 FINDINGS: Mild left lower lobe opacity, suspicious for pneumonia. Mild right basilar opacity, possibly atelectasis. No frank interstitial edema. No pleural effusion or pneumothorax. IMPRESSION: Mild left lower lobe opacity, suspicious for pneumonia. Electronically Signed   By: Julian Hy M.D.   On: 07/29/2020 13:14   DG Chest Portable 1 View  Result Date: 07/28/2020 CLINICAL DATA:  Shortness of breath EXAM: PORTABLE CHEST 1 VIEW COMPARISON:  11/10/2019 FINDINGS: Heart and mediastinal contours are within normal limits. No focal opacities or effusions. No acute bony abnormality. IMPRESSION: No active disease. Electronically Signed   By: Rolm Baptise M.D.   On: 07/28/2020 19:40   DG Abd Portable 1V  Result Date: 08/03/2020 CLINICAL DATA:  Abdominal distension. EXAM: PORTABLE ABDOMEN - 1 VIEW COMPARISON:  07/29/2020 FINDINGS: Bowel gas pattern is nonobstructive. No free peritoneal air. Minimal degenerate change of the spine and hips. IMPRESSION: Nonobstructive bowel gas pattern. Electronically Signed    By: Marin Olp M.D.   On: 08/03/2020 16:01   DG Abd Portable 1V  Result Date: 07/29/2020 CLINICAL DATA:  Attempt at OG tube. EXAM: PORTABLE ABDOMEN - 1 VIEW COMPARISON:  Radiograph earlier today. FINDINGS: The enteric tube projects over the midline of the chest, tip at the level of the carina. Progressive bibasilar opacities since chest radiograph earlier today. IMPRESSION: 1. Enteric tube projecting over the midline of the chest, tip at the level of the carina. Recommend repositioning. 2. Progressive bibasilar opacities since chest radiograph earlier today, likely COVID pneumonia. Electronically Signed   By: Keith Rake M.D.   On: 07/29/2020 22:04   VAS [place] LOWER EXTREMITY VENOUS (DVT)  Result Date: 07/29/2020  Lower Venous DVT Study Other Indications: Covid+ hypoxia. Performing Technologist: Rogelia Rohrer  Examination Guidelines: A complete evaluation includes B-mode imaging, spectral Doppler, color Doppler, and power Doppler as needed of all accessible portions of each vessel.  Bilateral testing is considered an integral part of a complete examination. Limited examinations for reoccurring indications may be performed as noted. The reflux portion of the exam is performed with the patient in reverse Trendelenburg.  +---------+---------------+---------+-----------+----------+--------------+ RIGHT    CompressibilityPhasicitySpontaneityPropertiesThrombus Aging +---------+---------------+---------+-----------+----------+--------------+ CFV      Full           Yes      Yes                                 +---------+---------------+---------+-----------+----------+--------------+ SFJ      Full                                                        +---------+---------------+---------+-----------+----------+--------------+ FV Prox  Full           Yes      Yes                                 +---------+---------------+---------+-----------+----------+--------------+ FV Mid   Full            Yes      Yes                                 +---------+---------------+---------+-----------+----------+--------------+ FV DistalFull           Yes      Yes                                 +---------+---------------+---------+-----------+----------+--------------+ PFV      Full                                                        +---------+---------------+---------+-----------+----------+--------------+ POP      Full           Yes      Yes                                 +---------+---------------+---------+-----------+----------+--------------+ PTV      Full                                                        +---------+---------------+---------+-----------+----------+--------------+ PERO     Full                                                        +---------+---------------+---------+-----------+----------+--------------+   +---------+---------------+---------+-----------+----------+--------------+ LEFT     CompressibilityPhasicitySpontaneityPropertiesThrombus Aging +---------+---------------+---------+-----------+----------+--------------+ CFV      Full           Yes  Yes                                 +---------+---------------+---------+-----------+----------+--------------+ SFJ      Full                                                        +---------+---------------+---------+-----------+----------+--------------+ FV Prox  Full           Yes      Yes                                 +---------+---------------+---------+-----------+----------+--------------+ FV Mid   Full           Yes      Yes                                 +---------+---------------+---------+-----------+----------+--------------+ FV DistalFull           Yes      Yes                                 +---------+---------------+---------+-----------+----------+--------------+ PFV      Full                                                         +---------+---------------+---------+-----------+----------+--------------+ POP      Full           Yes      Yes                                 +---------+---------------+---------+-----------+----------+--------------+ PTV      Full                                                        +---------+---------------+---------+-----------+----------+--------------+ PERO     Full                                                        +---------+---------------+---------+-----------+----------+--------------+     Summary: BILATERAL: - No evidence of deep vein thrombosis seen in the lower extremities, bilaterally. - RIGHT: - No cystic structure found in the popliteal fossa.  LEFT: - No cystic structure found in the popliteal fossa.  *See table(s) above for measurements and observations. Electronically signed by Jamelle Haring on 07/29/2020 at 7:25:56 PM.    Final    ECHOCARDIOGRAM LIMITED  Result Date: 07/29/2020    ECHOCARDIOGRAM LIMITED REPORT   Patient Name:   [name] [name] Date of Exam: 07/29/2020 Medical Rec #:  [id number]  Height:       66.0 in Accession #:    [id number]    Weight:       123.9 lb Date of Birth:  1960/09/30     BSA:          1.632 m Patient Age:    57 years      BP:           137/77 mmHg Patient Gender: F             HR:           155 bpm. Exam Location:  Inpatient Procedure: Limited Echo and Cardiac Doppler Indications:    R06.02 SOB  History:        Patient has prior history of Echocardiogram examinations, most                 recent 07/01/2019. Abnormal ECG, Signs/Symptoms:Syncope; Risk                 Factors:Diabetes. Covid positive.  Sonographer:    Roseanna Rainbow RDCS Referring Phys: [id number] [address] Moment [facility]  Sonographer Comments: Heart rate is very high despite meds. IMPRESSIONS  1. Left ventricular ejection fraction, by estimation, is 65 to 70%. The left ventricle has normal function. The left ventricle has no regional wall motion abnormalities.  2. The mitral valve is  normal in structure.  3. The aortic valve is normal in structure.  4. There is mildly elevated pulmonary artery systolic pressure. The estimated right ventricular systolic pressure is 28.3 mmHg.  5. The inferior vena cava is normal in size with <50% respiratory variability, suggesting right atrial pressure of 8 mmHg. FINDINGS  Left Ventricle: Left ventricular ejection fraction, by estimation, is 65 to 70%. The left ventricle has normal function. The left ventricle has no regional wall motion abnormalities. Right Ventricle: There is mildly elevated pulmonary artery systolic pressure. The tricuspid regurgitant velocity is 2.66 m/s, and with an assumed right atrial pressure of 8 mmHg, the estimated right ventricular systolic pressure is 15.1 mmHg. Mitral Valve: The mitral valve is normal in structure. Tricuspid Valve: The tricuspid valve is normal in structure. Tricuspid valve regurgitation is mild. Aortic Valve: The aortic valve is normal in structure. Venous: The inferior vena cava is normal in size with less than 50% respiratory variability, suggesting right atrial pressure of 8 mmHg. IAS/Shunts: The interatrial septum appears to be lipomatous. LEFT VENTRICLE PLAX 2D LVIDd:         3.10 cm LVIDs:         1.70 cm LV PW:         0.90 cm LV IVS:        1.00 cm LVOT diam:     1.60 cm LVOT Area:     2.01 cm  LV Volumes (MOD) LV vol d, MOD A2C: 47.2 ml LV vol d, MOD A4C: 40.2 ml LV vol s, MOD A2C: 14.6 ml LV vol s, MOD A4C: 16.4 ml LV SV MOD A2C:     32.6 ml LV SV MOD A4C:     40.2 ml LV SV MOD BP:      29.4 ml IVC IVC diam: 2.00 cm LEFT ATRIUM         Index LA diam:    2.30 cm 1.41 cm/m   AORTA Ao Root diam: 2.60 cm TRICUSPID VALVE TR Peak grad:   28.3 mmHg TR Vmax:        266.00 cm/s  SHUNTS Systemic Diam: 1.60 cm  Fransico Him MD Electronically signed by Fransico Him MD Signature Date/Time: 07/29/2020/5:07:56 PM    Final      PERTINENT LAB RESULTS: CBC: Recent Labs    08/10/20 0500 08/11/20 0136  WBC 10.4  10.9*  HGB 8.1* 8.5*  HCT 23.3* 25.2*  PLT 326 358   CMET CMP     Component Value Date/Time   NA 135 08/11/2020 0136   NA 135 08/09/2019 1715   K 3.9 08/11/2020 0136   CL 100 08/11/2020 0136   CO2 27 08/11/2020 0136   GLUCOSE 180 (H) 08/11/2020 0136   BUN 13 08/11/2020 0136   BUN 8 08/09/2019 1715   CREATININE 0.48 08/11/2020 0136   CREATININE 0.74 02/20/2015 1259   CALCIUM 8.0 (L) 08/11/2020 0136   PROT 5.3 (L) 08/06/2020 0106   PROT 6.9 06/28/2019 1225   ALBUMIN 1.8 (L) 08/06/2020 0106   ALBUMIN 3.9 06/28/2019 1225   AST 46 (H) 08/06/2020 0106   ALT 77 (H) 08/06/2020 0106   ALKPHOS 200 (H) 08/06/2020 0106   BILITOT 0.4 08/06/2020 0106   BILITOT 0.3 06/28/2019 1225   GFRNONAA >60 08/11/2020 0136   GFRAA >60 04/20/2020 0454    GFR Estimated Creatinine Clearance: 70 mL/min (by C-G formula based on SCr of 0.48 mg/dL). No results for input(s): LIPASE, AMYLASE in the last 72 hours. No results for input(s): CKTOTAL, CKMB, CKMBINDEX, TROPONINI in the last 72 hours. Invalid input(s): POCBNP No results for input(s): DDIMER in the last 72 hours. No results for input(s): HGBA1C in the last 72 hours. No results for input(s): CHOL, HDL, LDLCALC, TRIG, CHOLHDL, LDLDIRECT in the last 72 hours. No results for input(s): TSH, T4TOTAL, T3FREE, THYROIDAB in the last 72 hours.  Invalid input(s): FREET3 No results for input(s): VITAMINB12, FOLATE, FERRITIN, TIBC, IRON, RETICCTPCT in the last 72 hours. Coags: No results for input(s): INR in the last 72 hours.  Invalid input(s): PT Microbiology: No results found for this or any previous visit (from the past 240 hour(s)).  FURTHER DISCHARGE INSTRUCTIONS:  Get Medicines reviewed and adjusted: Please take all your medications with you for your next visit with your Primary MD  Laboratory/radiological data: Please request your Primary MD to go over all hospital tests and procedure/radiological results at the follow up, please ask your  Primary MD to get all Hospital records sent to his/her office.  In some cases, they will be blood work, cultures and biopsy results pending at the time of your discharge. Please request that your primary care M.D. goes through all the records of your hospital data and follows up on these results.  Also Note the following: If you experience worsening of your admission symptoms, develop shortness of breath, life threatening emergency, suicidal or homicidal thoughts you must seek medical attention immediately by calling 911 or calling your MD immediately  if symptoms less severe.  You must read complete instructions/literature along with all the possible adverse reactions/side effects for all the Medicines you take and that have been prescribed to you. Take any new Medicines after you have completely understood and accpet all the possible adverse reactions/side effects.   Do not drive when taking Pain medications or sleeping medications (Benzodaizepines)  Do not take more than prescribed Pain, Sleep and Anxiety Medications. It is not advisable to combine anxiety,sleep and pain medications without talking with your primary care practitioner  Special Instructions: If you have smoked or chewed Tobacco  in the last 2 yrs please stop smoking, stop any regular Alcohol  and or any Recreational drug use.  Wear Seat belts while driving.  Please note: You were cared for by a hospitalist during your hospital stay. Once you are discharged, your primary care physician will handle any further medical issues. Please note that NO REFILLS for any discharge medications will be authorized once you are discharged, as it is imperative that you return to your primary care physician (or establish a relationship with a primary care physician if you do not have one) for your post hospital discharge needs so that they can reassess your need for medications and monitor your lab values.  Total Time spent coordinating discharge  including counseling, education and face to face time equals 35 minutes.  SignedOren Binet 08/12/2020 10:25 AM

## 2020-08-12 NOTE — Discharge Instructions (Signed)
Person Under Monitoring Name: [name] [name]  Location: [place] [address]   Infection Prevention Recommendations for Individuals Confirmed to have, or Being Evaluated for, 2019 Novel Coronavirus (COVID-19) Infection Who Receive Care at Home  Individuals who are confirmed to have, or are being evaluated for, COVID-19 should follow the prevention steps below until a healthcare provider or local or state health department says they can return to normal activities.  Stay home except to get medical care You should restrict activities outside your home, except for getting medical care. Do not go to work, school, or public areas, and do not use public transportation or taxis.  Call ahead before visiting your doctor Before your medical appointment, call the healthcare provider and tell them that you have, or are being evaluated for, COVID-19 infection. This will help the healthcare providers office take steps to keep other people from getting infected. Ask your healthcare provider to call the local or state health department.  Monitor your symptoms Seek prompt medical attention if your illness is worsening (e.g., difficulty breathing). Before going to your medical appointment, call the healthcare provider and tell them that you have, or are being evaluated for, COVID-19 infection. Ask your healthcare provider to call the local or state health department.  Wear a facemask You should wear a facemask that covers your nose and mouth when you are in the same room with other people and when you visit a healthcare provider. People who live with or visit you should also wear a facemask while they are in the same room with you.  Separate yourself from other people in your home As much as possible, you should stay in a different room from other people in your home. Also, you should use a separate bathroom, if available.  Avoid sharing household items You should  not share dishes, drinking glasses, cups, eating utensils, towels, bedding, or other items with other people in your home. After using these items, you should wash them thoroughly with soap and water.  Cover your coughs and sneezes Cover your mouth and nose with a tissue when you cough or sneeze, or you can cough or sneeze into your sleeve. Throw used tissues in a lined trash can, and immediately wash your hands with soap and water for at least 20 seconds or use an alcohol-based hand rub.  Wash your [facility] your hands often and thoroughly with soap and water for at least 20 seconds. You can use an alcohol-based hand sanitizer if soap and water are not available and if your hands are not visibly dirty. Avoid touching your eyes, nose, and mouth with unwashed hands.   Prevention Steps for Caregivers and Household Members of Individuals Confirmed to have, or Being Evaluated for, COVID-19 Infection Being Cared for in the Home  If you live with, or provide care at home for, a person confirmed to have, or being evaluated for, COVID-19 infection please follow these guidelines to prevent infection:  Follow healthcare providers instructions Make sure that you understand and can help the patient follow any healthcare provider instructions for all care.  Provide for the patients basic needs You should help the patient with basic needs in the home and provide support for getting groceries, prescriptions, and other personal needs.  Monitor the patients symptoms If they are getting sicker, call his or her medical provider and tell them that the patient has, or is being evaluated for, COVID-19 infection. This will help the healthcare  providers office take steps to keep other people from getting infected. Ask the healthcare provider to call the local or state health department.  Limit the number of people who have contact with the patient  If possible, have only one caregiver for the  patient.  Other household members should stay in another home or place of residence. If this is not possible, they should stay  in another room, or be separated from the patient as much as possible. Use a separate bathroom, if available.  Restrict visitors who do not have an essential need to be in the home.  Keep older adults, very young children, and other sick people away from the patient Keep older adults, very young children, and those who have compromised immune systems or chronic health conditions away from the patient. This includes people with chronic heart, lung, or kidney conditions, diabetes, and cancer.  Ensure good ventilation Make sure that shared spaces in the home have good air flow, such as from an air conditioner or an opened window, weather permitting.  Wash your hands often  Wash your hands often and thoroughly with soap and water for at least 20 seconds. You can use an alcohol based hand sanitizer if soap and water are not available and if your hands are not visibly dirty.  Avoid touching your eyes, nose, and mouth with unwashed hands.  Use disposable paper towels to dry your hands. If not available, use dedicated cloth towels and replace them when they become wet.  Wear a facemask and gloves  Wear a disposable facemask at all times in the room and gloves when you touch or have contact with the patients blood, body fluids, and/or secretions or excretions, such as sweat, saliva, sputum, nasal mucus, vomit, urine, or feces.  Ensure the mask fits over your nose and mouth tightly, and do not touch it during use.  Throw out disposable facemasks and gloves after using them. Do not reuse.  Wash your hands immediately after removing your facemask and gloves.  If your personal clothing becomes contaminated, carefully remove clothing and launder. Wash your hands after handling contaminated clothing.  Place all used disposable facemasks, gloves, and other waste in a lined  container before disposing them with other household waste.  Remove gloves and wash your hands immediately after handling these items.  Do not share dishes, glasses, or other household items with the patient  Avoid sharing household items. You should not share dishes, drinking glasses, cups, eating utensils, towels, bedding, or other items with a patient who is confirmed to have, or being evaluated for, COVID-19 infection.  After the person uses these items, you should wash them thoroughly with soap and water.  Wash laundry thoroughly  Immediately remove and wash clothes or bedding that have blood, body fluids, and/or secretions or excretions, such as sweat, saliva, sputum, nasal mucus, vomit, urine, or feces, on them.  Wear gloves when handling laundry from the patient.  Read and follow directions on labels of laundry or clothing items and detergent. In general, wash and dry with the warmest temperatures recommended on the label.  Clean all areas the individual has used often  Clean all touchable surfaces, such as counters, tabletops, doorknobs, bathroom fixtures, toilets, phones, keyboards, tablets, and bedside tables, every day. Also, clean any surfaces that may have blood, body fluids, and/or secretions or excretions on them.  Wear gloves when cleaning surfaces the patient has come in contact with.  Use a diluted bleach solution (e.g., dilute bleach with  1 part bleach and 10 parts water) or a household disinfectant with a label that says EPA-registered for coronaviruses. To make a bleach solution at home, add 1 tablespoon of bleach to 1 quart (4 cups) of water. For a larger supply, add  cup of bleach to 1 gallon (16 cups) of water.  Read labels of cleaning products and follow recommendations provided on product labels. Labels contain instructions for safe and effective use of the cleaning product including precautions you should take when applying the product, such as wearing gloves or  eye protection and making sure you have good ventilation during use of the product.  Remove gloves and wash hands immediately after cleaning.  Monitor yourself for signs and symptoms of illness Caregivers and household members are considered close contacts, should monitor their health, and will be asked to limit movement outside of the home to the extent possible. Follow the monitoring steps for close contacts listed on the symptom monitoring form.   ? If you have additional questions, contact your local health department or call the epidemiologist on call at [phone] (available 24/7). ? This guidance is subject to change. For the most up-to-date guidance from [facility], please refer to their website: [email or web address]

## 2020-08-12 NOTE — Plan of Care (Signed)
  Problem: Education: Goal: Knowledge of General Education information will improve Description: Including pain rating scale, medication(s)/side effects and non-pharmacologic comfort measures Outcome: Completed/Met   Problem: Health Behavior/Discharge Planning: Goal: Ability to manage health-related needs will improve Outcome: Completed/Met   Problem: Clinical Measurements: Goal: Ability to maintain clinical measurements within normal limits will improve Outcome: Completed/Met Goal: Will remain free from infection Outcome: Completed/Met Goal: Diagnostic test results will improve Outcome: Completed/Met   Problem: Activity: Goal: Risk for activity intolerance will decrease Outcome: Completed/Met   Problem: Coping: Goal: Level of anxiety will decrease Outcome: Completed/Met   Problem: Education: Goal: Knowledge of risk factors and measures for prevention of condition will improve Outcome: Completed/Met   Problem: Coping: Goal: Psychosocial and spiritual needs will be supported Outcome: Completed/Met

## 2020-08-12 NOTE — TOC Progression Note (Signed)
Transition of Care [facility]) - Progression Note    Patient Details  Name: [name] [name] MRN: [id number] Date of Birth: [birth year]  Transition of [facility]) CM/SW [place], RN Phone Number: [phone]  08/12/2020, 3:43 PM  Clinical Narrative:    Rollator has been ordered and will be provided per [facility]. CM called patient to make her aware that there is no accepting [facility] agency and patient states that she would rather not have home health at this time because this is not her house and there were issues before. Patient request to not have home health at this time. MD has been updated. TOC will sign off.   Expected Discharge Plan: [place] Barriers to Discharge: Continued Medical Work up  Expected Discharge Plan and Services Expected Discharge Plan: [place] arrangements for the past 2 months: Single Family Home Expected Discharge Date: 08/12/20                                     Social Determinants of Health (SDOH) Interventions    Readmission Risk Interventions No flowsheet data found.

## 2020-08-13 ENCOUNTER — Telehealth: Payer: Self-pay

## 2020-08-13 NOTE — Telephone Encounter (Signed)
Transition Care Management Unsuccessful Follow-up Telephone Call  Date of discharge and from where:  [facility] on 08/12/2020  Attempts:  1st Attempt  Reason for unsuccessful TCM follow-up call:  Left voice message unable to reach patient to call this nurse and f /u .  Pt has scheduled hospital appt on 07/28/2020 with Dr Margarita Rana

## 2020-08-14 ENCOUNTER — Telehealth: Payer: Self-pay

## 2020-08-14 NOTE — Telephone Encounter (Signed)
Transition Care Management Unsuccessful Follow-up Telephone Call  Date of discharge and from where:  08/12/2020, [facility]  Attempts:  2nd Attempt  Reason for unsuccessful TCM follow-up call:  Left voice message on # [phone], call back requested to this CM.  Patient has appointment with Dr Margarita Rana 08/28/2020

## 2020-08-28 ENCOUNTER — Inpatient Hospital Stay: Payer: Medicaid Other | Admitting: Family Medicine

## 2020-09-15 IMAGING — DX DG TIBIA/FIBULA 2V*R*
4 series · 4 of 4 positions shown · non-contrast
Comparison: None.

CLINICAL DATA: Status post fall.

EXAM:
RIGHT TIBIA AND FIBULA - 2 VIEW

[tibia ap (1 of 2)]
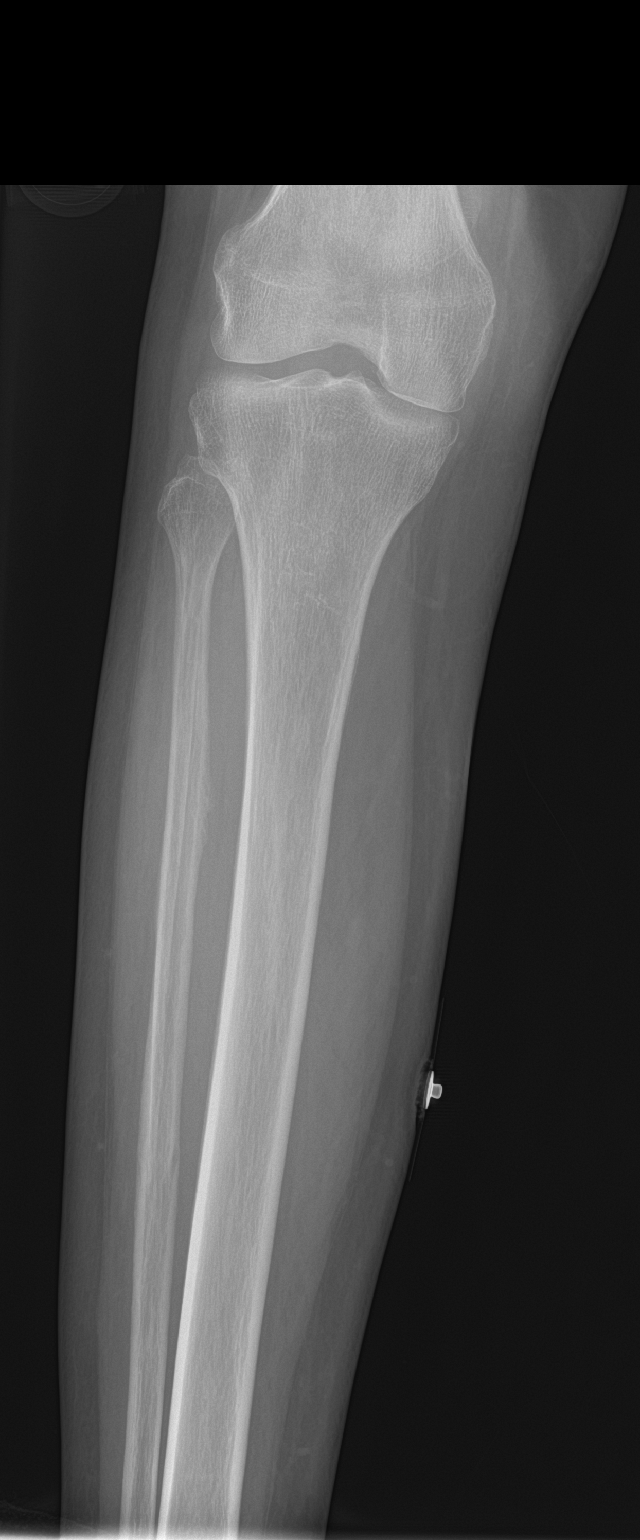

[tibia ap (2 of 2)]
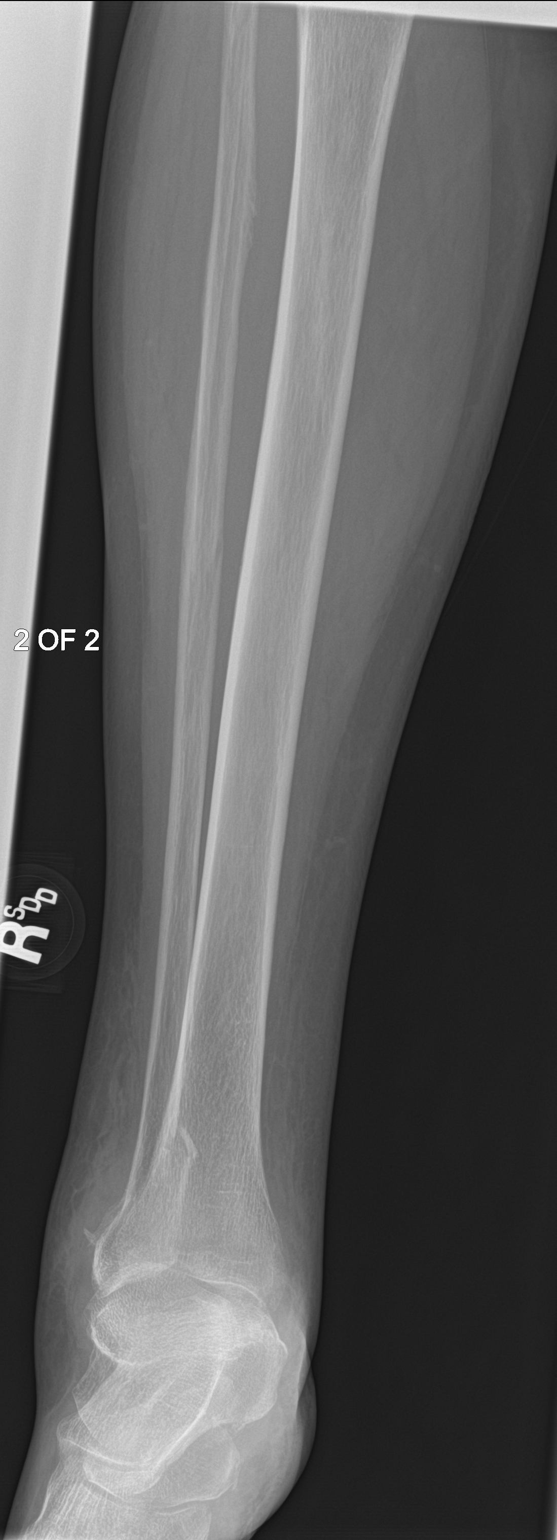

[tibia lat (1 of 2)]
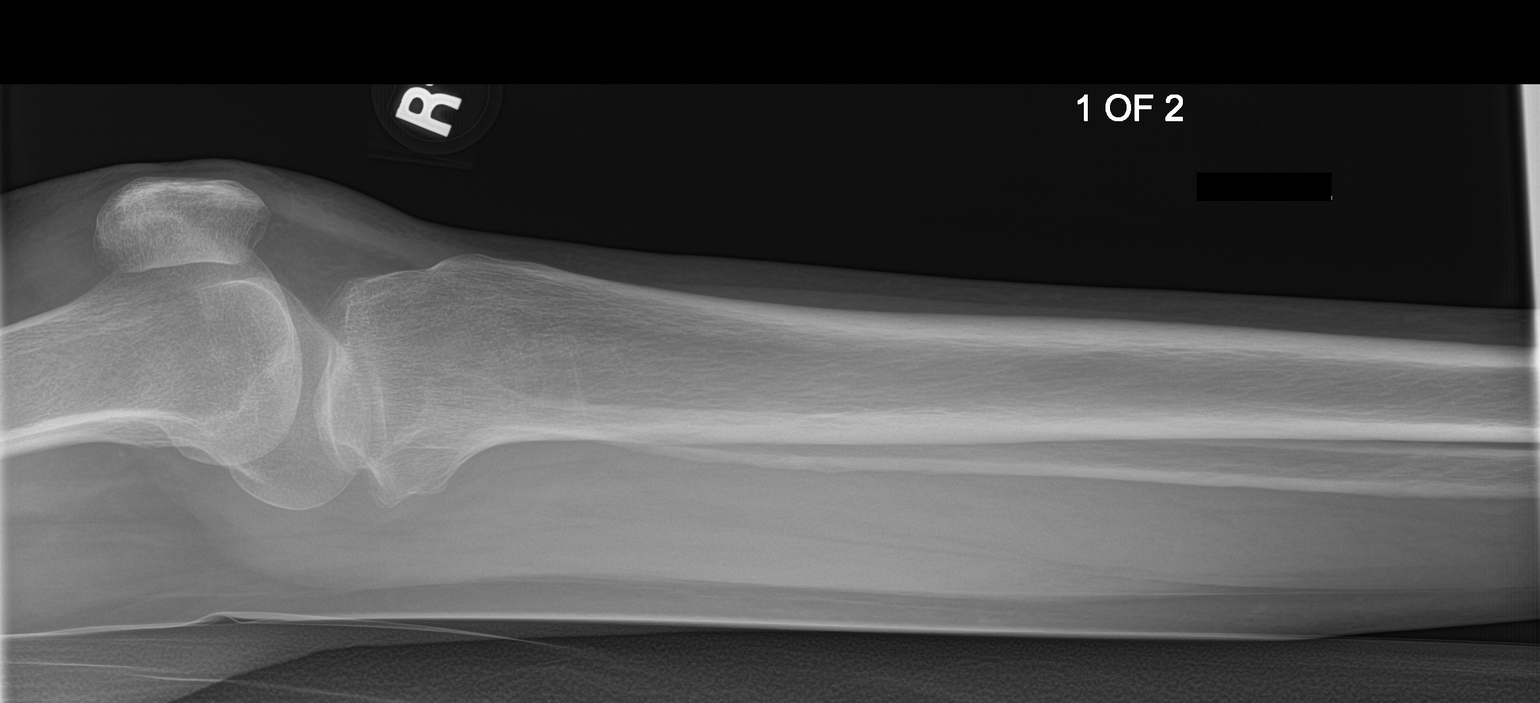

[tibia lat (2 of 2)]
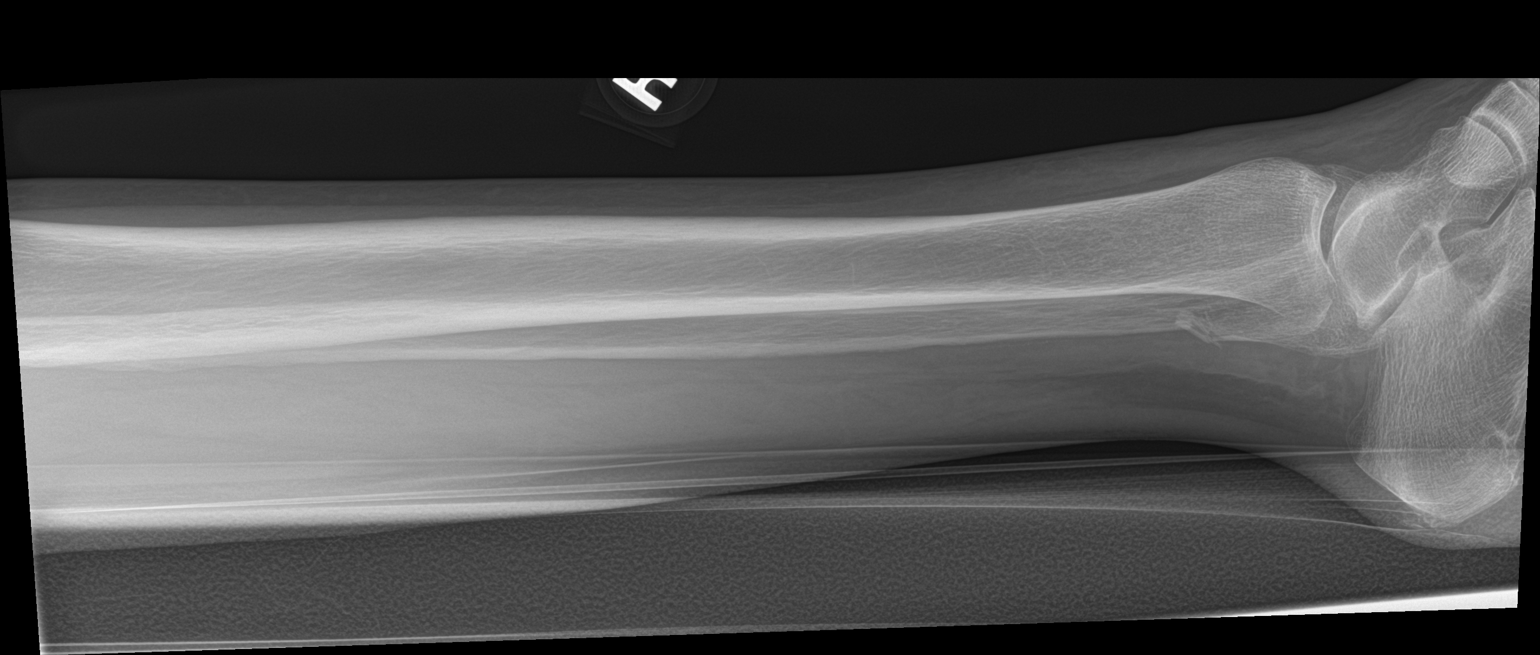

[4 of 4 positions shown; findings below may reference images not displayed]

FINDINGS: Acute fracture deformities are seen involving the right medial
malleolus and right posterior malleolus. An additional fracture is
seen along the distal right fibula, just above the right lateral
malleolus. There is no evidence of dislocation. Moderate severity
diffuse soft tissue swelling is noted.
IMPRESSION: Acute fractures of the right medial malleolus, right posterior
malleolus and distal right fibula.

## 2020-09-16 MED FILL — DULoxetine HCL 60 MG CPEP: 60 | 30 days supply | Qty: 60 | Fill #1

## 2020-09-16 MED FILL — GABAPENTIN 600 MG TABLET: 600 | 30 days supply | Qty: 120 | Fill #1

## 2020-09-26 ENCOUNTER — Ambulatory Visit: Payer: Medicaid Other | Admitting: Licensed Clinical Social Worker

## 2020-09-26 ENCOUNTER — Other Ambulatory Visit: Payer: Self-pay

## 2020-09-26 ENCOUNTER — Telehealth: Payer: Self-pay | Admitting: Licensed Clinical Social Worker

## 2020-09-26 ENCOUNTER — Telehealth: Payer: Self-pay

## 2020-09-26 DIAGNOSIS — F322 Major depressive disorder, single episode, severe without psychotic features: Secondary | ICD-10-CM | POA: Insufficient documentation

## 2020-09-26 NOTE — Telephone Encounter (Signed)
Pt is calling back and she d/c the call before I could transfer to office

## 2020-09-26 NOTE — Telephone Encounter (Signed)
Copied from [place] [id number]. Topic: Appointment Scheduling - Scheduling Inquiry for Clinic >> Sep 25, 2020  3:24 PM [place] wrote: Reason for CRM  Pt missed her appt with [facility] in Jan due to being in hospital with covid . Would like a FU to resch with Delana Meyer   [phone]  Do not see where patient had an appointment with you scheduled in Jan. Would you like me to reach out to patient regarding getting an appointment scheduled?

## 2020-09-26 NOTE — Telephone Encounter (Signed)
Called pt in reference to a referral made and pt reaching out to schedule an appt with SW services. Left message with call back number to assist pt with scheduling in the following week, as the LCSW will be leaving at the end of the month.
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
## 2020-10-03 ENCOUNTER — Institutional Professional Consult (permissible substitution): Payer: Self-pay | Admitting: Licensed Clinical Social Worker

## 2020-10-04 ENCOUNTER — Ambulatory Visit: Payer: Self-pay | Attending: Family Medicine | Admitting: Licensed Clinical Social Worker

## 2020-10-04 ENCOUNTER — Other Ambulatory Visit: Payer: Self-pay

## 2020-10-04 DIAGNOSIS — F411 Generalized anxiety disorder: Secondary | ICD-10-CM

## 2020-10-04 DIAGNOSIS — F331 Major depressive disorder, recurrent, moderate: Secondary | ICD-10-CM

## 2020-10-04 NOTE — BH Specialist Note (Signed)
Integrated Behavioral Health via Telemedicine Visit  10/04/2020 [name] [name] [id number]  Number of [place] visits: 2 Session Start time: 3:00 PM  Session End time: 3:30 PM Total time: 30  Referring Provider: Pt is a prior pt of Dr. Chapman Fitch. Has scheduled appt with Dr. Margarita Rana Patient/Family location: Home [facility] Provider location: Office All persons participating in visit: LCSW and Patient Types of Service: Telephone visit  I connected with [name] [name] via  Telephone or Geologist, engineering  (Video is Tree surgeon) and verified that I am speaking with the correct person using two identifiers. Discussed confidentiality: Yes   I discussed the limitations of telemedicine and the availability of in person appointments.  Discussed there is a possibility of technology failure and discussed alternative modes of communication if that failure occurs.  I discussed that engaging in this telemedicine visit, they consent to the provision of behavioral healthcare and the services will be billed under their insurance.  Patient and/or legal guardian expressed understanding and consented to Telemedicine visit: Yes   Presenting Concerns: Patient and/or family reports the following symptoms/concerns: Pt reports difficulty managing depression and anxiety symptoms triggered by current housing arrangement and difficulty managing physical health conditions. Has disabilty hearing on June 2022 and is hopeful that she will obtain independent housing in the near future Duration of problem: Ongoing; Severity of problem: moderate  Patient and/or Family's Strengths/Protective Factors: Social connections, Social and Emotional competence and Concrete supports in place (healthy food, safe environments, etc.)  Goals Addressed: Patient will: 1.  Reduce symptoms of: anxiety and depression Pt agreed to continue compliance with medication management 2.  Increase knowledge  and/or ability of: stress reduction Pt agreed to practice self-care strategies (talking with preacher, visiting family/church to promote positive experiences, using positive language) on a routine basis to assist with management of symptoms 3.  Demonstrate ability to: Increase healthy adjustment to current life circumstances and Increase adequate support systems for patient/family Pt agreed to follow up with [facility] to initiate therapy. Pt was also encouraged to contact [facility] for therapy, if the wait time is over two months for [facility]  Progress towards Goals: Ongoing  Interventions: Interventions utilized:  Solution-Focused Strategies, Supportive Counseling and Link to [employer] Standardized Assessments completed: Not Needed  Patient Response: Pt was engaged during session and was successful in identifying goals to assist with management of symptoms  Assessment: Patient currently experiencing difficulty managing depression and anxiety symptoms. She denies any current plan or intent for suicidal/homicidal ideations  Patient may benefit from therapy and continued medication management. Strategies were identified to assist with stress management and promotion of self-care. Pt was provided information via e-mail for initiation of therapy.   Plan: 1. Follow up with behavioral health clinician on : Contact clinic with any additional behavioral health and/or resource needs 2. Behavioral recommendations: Utilize strategies discussed and comply with medications 3. Referral(s): [facility] (In Clinic) and [place] (LME/Outside Clinic)  I discussed the assessment and treatment plan with the patient and/or parent/guardian. They were provided an opportunity to ask questions and all were answered. They agreed with the plan and demonstrated an understanding of the instructions.   They were advised to call back  or seek an in-person evaluation if the symptoms worsen or if the condition fails to improve as anticipated.  Rebekah Chesterfield, LCSW  10/07/20 10:50 AM

## 2020-10-09 NOTE — Telephone Encounter (Signed)
3/10: Called pt to schedule an appt for next week or to immediately help but pt wasn't available and a message was left. Pt has since seen LCSW Christa See

## 2020-10-14 ENCOUNTER — Ambulatory Visit: Payer: Self-pay | Attending: Family Medicine | Admitting: Family Medicine

## 2020-10-14 ENCOUNTER — Telehealth: Payer: Self-pay

## 2020-10-14 ENCOUNTER — Other Ambulatory Visit: Payer: Self-pay | Admitting: Family Medicine

## 2020-10-14 ENCOUNTER — Encounter: Payer: Self-pay | Admitting: Family Medicine

## 2020-10-14 ENCOUNTER — Other Ambulatory Visit: Payer: Self-pay

## 2020-10-14 VITALS — BP 90/60 | HR 119 | Wt 142.6 lb

## 2020-10-14 DIAGNOSIS — R000 Tachycardia, unspecified: Secondary | ICD-10-CM

## 2020-10-14 DIAGNOSIS — D649 Anemia, unspecified: Secondary | ICD-10-CM

## 2020-10-14 DIAGNOSIS — E1143 Type 2 diabetes mellitus with diabetic autonomic (poly)neuropathy: Secondary | ICD-10-CM

## 2020-10-14 DIAGNOSIS — Z1331 Encounter for screening for depression: Secondary | ICD-10-CM

## 2020-10-14 DIAGNOSIS — F331 Major depressive disorder, recurrent, moderate: Secondary | ICD-10-CM

## 2020-10-14 DIAGNOSIS — Z794 Long term (current) use of insulin: Secondary | ICD-10-CM

## 2020-10-14 DIAGNOSIS — I951 Orthostatic hypotension: Secondary | ICD-10-CM

## 2020-10-14 DIAGNOSIS — E2740 Unspecified adrenocortical insufficiency: Secondary | ICD-10-CM

## 2020-10-14 LAB — GLUCOSE, POCT (MANUAL RESULT ENTRY): POC Glucose: 118 mg/dl — AB (ref 70–99)

## 2020-10-14 MED ORDER — LANTUS SOLOSTAR 100 UNIT/ML ~~LOC~~ SOPN
50.0000 [IU] | PEN_INJECTOR | Freq: Every day | SUBCUTANEOUS | 3 refills | Status: DC
Start: 2020-10-14 — End: 2021-06-19

## 2020-10-14 MED ORDER — HUMALOG KWIKPEN 100 UNIT/ML ~~LOC~~ SOPN: 0.0000 [IU] | PEN_INJECTOR | Freq: Three times a day (TID) | SUBCUTANEOUS | 3 refills | Status: DC

## 2020-10-14 MED ORDER — DULOXETINE HCL 60 MG PO CPEP: 60.0000 mg | ORAL_CAPSULE | Freq: Two times a day (BID) | ORAL | 3 refills | Status: DC

## 2020-10-14 MED ORDER — HYDROCORTISONE 10 MG PO TABS: 10.0000 mg | ORAL_TABLET | Freq: Every day | ORAL | 3 refills | Status: DC

## 2020-10-14 MED ORDER — GABAPENTIN 600 MG PO TABS: ORAL_TABLET | ORAL | 3 refills | Status: DC

## 2020-10-14 MED ORDER — MIDODRINE HCL 5 MG PO TABS: 5.0000 mg | ORAL_TABLET | Freq: Three times a day (TID) | ORAL | 3 refills | Status: DC

## 2020-10-14 MED FILL — ?HUMALOG 100 UNITS/ML KWIKP: 100 | 32 days supply | Qty: 12 | Fill #0

## 2020-10-14 MED FILL — DULoxetine HCL 60 MG CPEP: 60 | 30 days supply | Qty: 60 | Fill #0

## 2020-10-14 MED FILL — GABAPENTIN 600 MG TABLET: 600 | 30 days supply | Qty: 120 | Fill #0

## 2020-10-14 MED FILL — ?BASAGLAR 100 UNITS/ML KWPE: 100 | 30 days supply | Qty: 15 | Fill #0

## 2020-10-14 NOTE — Progress Notes (Deleted)
Subjective:  Patient ID: [name] [name], female    DOB: 10/27/1960  Age: 60 y.o. MRN: [id number]  CC: Hospitalization Follow-up   HPI [name] [name] presents for ***  She had R foot surgery by [facility] due to R ankle fracture and is followed by  Dr Marica Otter.  Ran out of Midodrine and is unable to afford  Past Medical History:  Diagnosis Date  . Cervical disc disorder with radiculopathy of cervical region   . Chronic pain syndrome   . Degenerative joint disease   . Depression dx 1997  . Diabetes mellitus, type 2 ([place]) 2011   No insulin  . Diabetic polyneuropathy ([place])   . GERD (gastroesophageal reflux disease)   . Glaucoma   . Headache(784.0)   . Hypertension    Lab 11/2011:  CXR, EKG, CBC, TSH, BMet, troponin-normal; lipid profile: 188, 131, 36, 126   . Lumbar herniated disc   . Non-compliance   . Pancreatic cyst    Endoscopic aspiration in 09/2009  . Pneumonia 08/02/2012  . Shingles   . 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 Torn meniscus   . Vertigo     Past Surgical History:  Procedure Laterality Date  . ABDOMINAL HYSTERECTOMY    . CESAREAN SECTION    . ORIF ANKLE FRACTURE Right 02/25/2020   Procedure: OPEN REDUCTION INTERNAL FIXATION (ORIF) ANKLE FRACTURE;  Surgeon: Lovell Sheehan, MD;  Location: [facility] ORS;  Service: Orthopedics;  Laterality: Right;    Family History  Problem Relation Age of Onset  . Depression Mother        type 1  . Diabetes Mother   . Renal Disease Mother   . Lung cancer Father   . Heart attack Brother        Died age 33 - was told due to massive MI/heart "exploded"  . Heart failure Maternal Grandmother        Details not totally clear    Allergies  Allergen Reactions  . Tramadol Nausea And Vomiting    REACTION: Projectile vomiting    Outpatient Medications Prior to Visit  Medication Sig Dispense Refill  . acetaminophen (TYLENOL) 500 MG tablet Take 2 tablets (1,000 mg total) by mouth every 6 (six) hours as needed for  mild pain or headache (pain).  0  . Blood Glucose Monitoring Suppl (TRUE METRIX METER) w/Device KIT Use as instructed to check blood sugar once daily. 1 kit 0  . Cholecalciferol (VITAMIN D-3) 125 MCG (5000 UT) TABS Take 1 tablet by mouth daily. 90 tablet 3  . diclofenac Sodium (VOLTAREN) 1 % GEL Apply 1 application topically 4 (four) times daily. 100 g 0  . DULoxetine (CYMBALTA) 60 MG capsule Take 1 capsule (60 mg total) by mouth 2 (two) times daily. 60 capsule 0  . gabapentin (NEURONTIN) 600 MG tablet TAKE ONE TABLET (600 MG) BY MOUTH EVERY MORNING AND AFTERNOON, TAKE TWO TABLETS AT NIGHT 120 tablet 0  . glucose blood (TRUE METRIX BLOOD GLUCOSE TEST) test strip Use as instructed to check blood sugar once daily. 100 each 2  . hydrocortisone (CORTEF) 10 MG tablet Take 1 tablet (10 mg total) by mouth daily at 10 pm. 30 tablet 0  . insulin glargine (LANTUS SOLOSTAR) 100 UNIT/ML Solostar Pen Inject 26 Units into the skin daily. 15 mL 0  . Insulin Pen Needle (PEN NEEDLES) 31G X 8 MM MISC Use as directed 100 each 6  . midodrine (PROAMATINE) 5  MG tablet Take 1 tablet (5 mg total) by mouth 3 (three) times daily with meals. 90 tablet 0  . Multiple Vitamins-Minerals (CENTRUM SILVER 50+MEN) TABS Take 1 tablet by mouth daily.    Marland Kitchen HUMALOG KWIKPEN 100 UNIT/ML KwikPen Inject 10 Units into the skin 3 (three) times daily. 9 mL 0   No facility-administered medications prior to visit.     ROS Review of Systems  Objective:  BP 90/60   Pulse (!) 119   Wt 142 lb 9.6 oz (64.7 kg)   SpO2 99%   BMI 23.02 kg/m   BP/Weight 10/14/2020 08/12/2020 10/62/6948  Systolic BP 90 546 270  Diastolic BP 60 77 98  Wt. (Lbs) 142.6 133.16 140  BMI 23.02 21.49 22.6      Physical Exam  CMP Latest Ref Rng & Units 08/11/2020 08/10/2020 08/09/2020  Glucose 70 - 99 mg/dL 180(H) 148(H) 180(H)  BUN 6 - 20 mg/dL _0 Creatinine 0.44 - 1.00 mg/dL 0.48 0.53 0.47  Sodium 135 - 145 mmol/L 135 139 137  Potassium 3.5 - 5.1  mmol/L 3.9 3.8 4.1  Chloride 98 - 111 mmol/L 100 101 101  CO2 22 - 32 mmol/L _1 Calcium 8.9 - 10.3 mg/dL 8.0(L) 7.9(L) 8.3(L)  Total Protein 6.5 - 8.1 g/dL - - -  Total Bilirubin 0.3 - 1.2 mg/dL - - -  Alkaline Phos 38 - 126 U/L - - -  AST 15 - 41 U/L - - -  ALT 0 - 44 U/L - - -    Lipid Panel     Component Value Date/Time   CHOL 197 03/15/2019 1056   TRIG 156 (H) 07/29/2020 0730   HDL 36 (L) 03/15/2019 1056   CHOLHDL 5.5 (H) 03/15/2019 1056   CHOLHDL 7.6 04/10/2016 0714   VLDL 65 (H) 04/10/2016 0714   LDLCALC 109 (H) 03/15/2019 1056    CBC    Component Value Date/Time   WBC 10.9 (H) 08/11/2020 0136   RBC 2.71 (L) 08/11/2020 0136   HGB 8.5 (L) 08/11/2020 0136   HCT 25.2 (L) 08/11/2020 0136   PLT 358 08/11/2020 0136   MCV 93.0 08/11/2020 0136   MCH 31.4 08/11/2020 0136   MCHC 33.7 08/11/2020 0136   RDW 14.8 08/11/2020 0136   LYMPHSABS 1.9 08/06/2020 0757   MONOABS 0.8 08/06/2020 0757   EOSABS 0.1 08/06/2020 0757   BASOSABS 0.0 08/06/2020 0757    Lab Results  Component Value Date   HGBA1C 12.3 (H) 07/29/2020    Assessment & Plan:  1. Type 2 diabetes mellitus with diabetic autonomic neuropathy, with long-term current use of insulin (HCC) ***  2. Positive depression screening ***   Health Care Maintenance: *** No orders of the defined types were placed in this encounter.   For blood sugars 0-150 give 0 units of insulin, 151-200 give 2 units of insulin, 201-250 give 4 units, 251-300 give 6 units, 301-350 give 8 units, 351-400 give 10 units,> 400 give 12 units and call M.D. Discussed hypoglycemia protocol.   Follow-up: No follow-ups on file.       Charlott Rakes, MD, FAAFP. [facility] [place], [place]   10/14/2020, 3:18 PM

## 2020-10-14 NOTE — Progress Notes (Signed)
Having dizzy spells, Numbness in mouth Fingers and toes are numd

## 2020-10-14 NOTE — Patient Instructions (Signed)
Diabetic Neuropathy Diabetic neuropathy refers to nerve damage that is caused by diabetes. Over time, people with diabetes can develop nerve damage throughout the body. There are several types of diabetic neuropathy:  Peripheral neuropathy. This is the most common type of diabetic neuropathy. It damages the nerves that carry signals between the spinal cord and other parts of the body (peripheral nerves). This usually affects nerves in the feet, legs, hands, and arms.  Autonomic neuropathy. This type causes damage to nerves that control involuntary functions (autonomic nerves). Involuntary functions are functions of the body that you do not control. They include heartbeat, body temperature, blood pressure, urination, digestion, sweating, sexual function, or response to changes in blood glucose.  Focal neuropathy. This type of nerve damage affects one area of the body, such as an arm, a leg, or the face. The injury may involve one nerve or a small group of nerves. Focal neuropathy can be painful and unpredictable. It occurs most often in older adults with diabetes. This often develops suddenly, but usually improves over time and does not cause long-term problems.  Proximal neuropathy. This type of nerve damage affects the nerves of the thighs, hips, buttocks, or legs. It causes severe pain, weakness, and muscle death (atrophy), usually in the thigh muscles. It is more common among older men and people who have type 2 diabetes. The length of recovery time may vary. What are the causes? Peripheral, autonomic, and focal neuropathies are caused by diabetes that is not well controlled with treatment. The cause of proximal neuropathy is not known, but it may be caused by inflammation related to uncontrolled blood glucose levels. What are the signs or symptoms? Peripheral neuropathy Peripheral neuropathy develops slowly over time. When the nerves of the feet and legs no longer work, you may  experience:  Burning, stabbing, or aching pain in the legs or feet.  Pain or cramping in the legs or feet.  Loss of feeling (numbness) and inability to feel pressure or pain in the feet. This can lead to: ? Thick calluses or sores on areas of constant pressure. ? Ulcers. ? Reduced ability to feel temperature changes.  Foot deformities.  Muscle weakness.  Loss of balance or coordination. Autonomic neuropathy The symptoms of autonomic neuropathy vary depending on which nerves are affected. Symptoms may include:  Problems with digestion, such as: ? Nausea or vomiting. ? Poor appetite. ? Bloating. ? Diarrhea or constipation. ? Trouble swallowing. ? Losing weight without trying to.  Problems with the heart, blood, and lungs, such as: ? Dizziness, especially when standing up. ? Fainting. ? Shortness of breath. ? Irregular heartbeat.  Bladder problems, such as: ? Trouble starting or stopping urination. ? Leaking urine. ? Trouble emptying the bladder. ? Urinary tract infections (UTIs).  Problems with other body functions, such as: ? Sweat. You may sweat too much or too little. ? Temperature. You might get hot easily. Or, you might feel cold more than usual. ? Sexual function. Men may not be able to get or maintain an erection. Women may have vaginal dryness and difficulty with arousal. Focal neuropathy Symptoms affect only one area of the body. Common symptoms include:  Numbness.  Tingling.  Burning pain.  Prickling feeling.  Very sensitive skin.  Weakness.  Inability to move (paralysis).  Muscle twitching.  Muscles getting smaller (wasting).  Poor coordination.  Double or blurred vision. Proximal neuropathy  Sudden, severe pain in the hip, thigh, or buttocks. Pain may spread from the back into the legs (  sciatica).  Pain and numbness in the arms and legs.  Tingling.  Loss of bladder control or bowel control.  Weakness and wasting of thigh  muscles.  Difficulty getting up from a seated position.  Abdominal swelling.  Unexplained weight loss. How is this diagnosed? Diagnosis varies depending on the type of neuropathy your health care provider suspects. Peripheral neuropathy Your health care provider will do a neurologic exam. This exam checks your reflexes, how you move, and what you can feel. You may have other tests, such as:  Blood tests.  Tests of the fluid that surrounds the spinal cord (lumbar puncture).  CT scan.  MRI.  Checking the nerves that control muscles (electromyogram, or EMG).  Checking how quickly signals pass through your nerves (nerve conduction study).  Checking a small piece of a nerve using a microscope (biopsy). Autonomic neuropathy You may have tests, such as:  Tests to measure your blood pressure and heart rate. You may be secured to an exam table that moves you from a lying position to an upright position (table tilt test).  Breathing tests to check your lungs.  Tests to check how food moves through the digestive system (gastric emptying tests).  Blood, sweat, or urine tests.  Ultrasound of your bladder.  Spinal fluid tests. Focal neuropathy This condition may be diagnosed with:  A neurologic exam.  CT scan.  MRI.  EMG.  Nerve conduction study. Proximal neuropathy There is no test to diagnose this type of neuropathy. You may have tests to rule out other possible causes of this type of neuropathy. Tests may include:  X-rays of your spine and lumbar region.  Lumbar puncture.  MRI. How is this treated? The goal of treatment is to keep nerve damage from getting worse. Treatment may include:  Following your diabetes management plan. This will help keep your blood glucose level and your A1C level within your target range. This is the most important treatment.  Using prescription pain medicine. Follow these instructions at home: Diabetes management Follow your diabetes  management plan as told by your health care provider.  Check your blood glucose levels.  Keep your blood glucose in your target range.  Have your A1C level checked at least two times a year, or as often as told.  Take over the counter and prescription medicines only as told by your health care provider. This includes insulin and diabetes medicine.   Lifestyle  Do not use any products that contain nicotine or tobacco, such as cigarettes, e-cigarettes, and chewing tobacco. If you need help quitting, ask your health care provider.  Be physically active every day. Include strength training and balance exercises.  Follow a healthy meal plan.  Work with your health care provider to manage your blood pressure.   General instructions  Ask your health care provider if the medicine prescribed to you requires you to avoid driving or using machinery.  Check your skin and feet every day for cuts, bruises, redness, blisters, or sores.  Keep all follow-up visits. This is important. Contact a health care provider if:  You have burning, stabbing, or aching pain in your legs or feet.  You are unable to feel pressure or pain in your feet.  You develop problems with digestion, such as: ? Nausea. ? Vomiting. ? Bloating. ? Constipation. ? Diarrhea. ? Abdominal pain.  You have difficulty with urination, such as: ? Inability to control when you urinate (incontinence). ? Inability to completely empty the bladder (retention).  You feel   as if your heart is racing (palpitations).  You feel dizzy, weak, or faint when you stand up. Get help right away if:  You cannot urinate.  You have sudden weakness or loss of coordination.  You have trouble speaking.  You have pain or pressure in your chest.  You have an irregular heartbeat.  You have sudden inability to move a part of your body. These symptoms may represent a serious problem that is an emergency. Do not wait to see if the symptoms  will go away. Get medical help right away. Call your local emergency services (911 in the [place] Do not drive yourself to the hospital. Summary  Diabetic neuropathy is nerve damage that is caused by diabetes. It can cause numbness and pain in the arms, legs, digestive tract, heart, and other body systems.  This condition is treated by keeping your blood glucose level and your A1C level within your target range. This can help prevent neuropathy from getting worse.  Check your skin and feet every day for cuts, bruises, redness, blisters, or sores.  Do not use any products that contain nicotine or tobacco, such as cigarettes, e-cigarettes, and chewing tobacco. This information is not intended to replace advice given to you by your health care provider. Make sure you discuss any questions you have with your health care provider. Document Revised: 11/16/2019 Document Reviewed: 11/16/2019 Elsevier Patient Education  2021 [employer].  

## 2020-10-14 NOTE — Telephone Encounter (Signed)
RN Case Manager and Transitional Care Coordinator assisted the patient with identifying resources with medication assistance from [facility], local mental heath resources and financial assistance for medical services during today's in person visit.

## 2020-10-14 NOTE — Telephone Encounter (Signed)
Met with the patient when she was in the clinic today. Darliss Cheney, Transitional Care Coordinator also present.  The patient has not been able to afford her medications.  Explained to her that she can receive a one time free fill of most medications from [facility] and those medications will be ready for pick up today. She will then need to apply for [facility] Financial Assistance/Orange Card/Blue Card again.  She said that her prior cards lapsed and she has the information packet to complete prior to her upcoming appointment with [facility].   Regarding her disability, she has been denied twice and has submitted a third application. She has a hearing scheduled for 01/08/2021 and is working with an attorney in the community.  She spoke to [facility] about the need for behavioral health support and Brayton Layman provided her with local resources and suggested that a virtual behavioral health appointment must be best for her due to her lack of transportation to medical appointments.  The patient also noted that after she is approved for disability, she would like to move into her own apartment as is has been very stressful for her to live with her son and his family.

## 2020-10-15 LAB — CBC WITH DIFFERENTIAL/PLATELET
Basophils Absolute: 0 10*3/uL (ref 0.0–0.2)
Basos: 1 %
EOS (ABSOLUTE): 0.2 10*3/uL (ref 0.0–0.4)
Eos: 2 %
Hematocrit: 40.1 % (ref 34.0–46.6)
Hemoglobin: 13.8 g/dL (ref 11.1–15.9)
Immature Grans (Abs): 0 10*3/uL (ref 0.0–0.1)
Immature Granulocytes: 0 %
Lymphocytes Absolute: 3.5 10*3/uL — ABNORMAL HIGH (ref 0.7–3.1)
Lymphs: 42 %
MCH: 31.3 pg (ref 26.6–33.0)
MCHC: 34.4 g/dL (ref 31.5–35.7)
MCV: 91 fL (ref 79–97)
Monocytes Absolute: 0.7 10*3/uL (ref 0.1–0.9)
Monocytes: 8 %
Neutrophils Absolute: 4.1 10*3/uL (ref 1.4–7.0)
Neutrophils: 47 %
Platelets: 295 10*3/uL (ref 150–450)
RBC: 4.41 x10E6/uL (ref 3.77–5.28)
RDW: 12.7 % (ref 11.7–15.4)
WBC: 8.5 10*3/uL (ref 3.4–10.8)

## 2020-10-15 NOTE — Progress Notes (Signed)
Subjective:  Patient ID: [name] [name], female    DOB: [birth year]  Age: 60 y.o. MRN: [id number]  CC: Hospitalization Follow-up   HPI [name] [name] is a 60 year old female patient of Dr. Chapman Fitch with a history of type 2 diabetes mellitus (A1c 12.3), orthostatic hypotension, adrenal insufficiency.  Depression who presents today to establish care after hospitalization at [facility] in 07/2020 for DKA, pneumonia. During her hospitalization she was restarted on Cortef for management of adrenal insufficiency. She has been out of all her medications and on presentation was found to be dizzy while attempting to obtain vital signs by the nurse.  She has been unable to afford her medications due to financial constraints and complaints she feels she is a burden to her family members.  She breaks down crying. Her blood pressure is on the low side and she has been without her midodrine.  Her heart rate is tachycardic and she states this is not new for her. Endorses administering Lantus 50 units nightly but her medication list reveals she should be on 26 units at bedtime.  I have reviewed previous notes from the clinical pharmacist in the clinic which indicate that she was previously on 45 units.  She has not been taking her fast acting insulin as she forgets to do so.  She sustained a right ankle fracture and is currently on the boot.  Followed by Dr. Marisa Sprinkles, Dr Marica Otter.  Past Medical History:  Diagnosis Date  . Cervical disc disorder with radiculopathy of cervical region   . Chronic pain syndrome   . Degenerative joint disease   . Depression dx 1997  . Diabetes mellitus, type 2 ([place]) 2011   No insulin  . Diabetic polyneuropathy ([place])   . GERD (gastroesophageal reflux disease)   . Glaucoma   . Headache(784.0)   . Hypertension    Lab 11/2011:  CXR, EKG, CBC, TSH, BMet, troponin-normal; lipid profile: 188, 131, 36, 126   . Lumbar herniated disc   . Non-compliance   . Pancreatic cyst     Endoscopic aspiration in 09/2009  . Pneumonia 08/02/2012  . Shingles   . 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 Torn meniscus   . Vertigo     Past Surgical History:  Procedure Laterality Date  . ABDOMINAL HYSTERECTOMY    . CESAREAN SECTION    . ORIF ANKLE FRACTURE Right 02/25/2020   Procedure: OPEN REDUCTION INTERNAL FIXATION (ORIF) ANKLE FRACTURE;  Surgeon: Lovell Sheehan, MD;  Location: [facility] ORS;  Service: Orthopedics;  Laterality: Right;    Family History  Problem Relation Age of Onset  . Depression Mother        type 1  . Diabetes Mother   . Renal Disease Mother   . Lung cancer Father   . Heart attack Brother        Died age 72 - was told due to massive MI/heart "exploded"  . Heart failure Maternal Grandmother        Details not totally clear    Allergies  Allergen Reactions  . Tramadol Nausea And Vomiting    REACTION: Projectile vomiting    Outpatient Medications Prior to Visit  Medication Sig Dispense Refill  . acetaminophen (TYLENOL) 500 MG tablet Take 2 tablets (1,000 mg total) by mouth every 6 (six) hours as needed for mild pain or headache (pain).  0  . Blood Glucose Monitoring Suppl (TRUE METRIX METER) w/Device KIT Use as instructed  to check blood sugar once daily. 1 kit 0  . Cholecalciferol (VITAMIN D-3) 125 MCG (5000 UT) TABS Take 1 tablet by mouth daily. 90 tablet 3  . diclofenac Sodium (VOLTAREN) 1 % GEL Apply 1 application topically 4 (four) times daily. 100 g 0  . glucose blood (TRUE METRIX BLOOD GLUCOSE TEST) test strip Use as instructed to check blood sugar once daily. 100 each 2  . Insulin Pen Needle (PEN NEEDLES) 31G X 8 MM MISC Use as directed 100 each 6  . Multiple Vitamins-Minerals (CENTRUM SILVER 50+MEN) TABS Take 1 tablet by mouth daily.    . DULoxetine (CYMBALTA) 60 MG capsule Take 1 capsule (60 mg total) by mouth 2 (two) times daily. 60 capsule 0  . gabapentin (NEURONTIN) 600 MG tablet TAKE ONE TABLET (600 MG) BY MOUTH EVERY MORNING  AND AFTERNOON, TAKE TWO TABLETS AT NIGHT 120 tablet 0  . hydrocortisone (CORTEF) 10 MG tablet Take 1 tablet (10 mg total) by mouth daily at 10 pm. 30 tablet 0  . insulin glargine (LANTUS SOLOSTAR) 100 UNIT/ML Solostar Pen Inject 26 Units into the skin daily. 15 mL 0  . midodrine (PROAMATINE) 5 MG tablet Take 1 tablet (5 mg total) by mouth 3 (three) times daily with meals. 90 tablet 0  . HUMALOG KWIKPEN 100 UNIT/ML KwikPen Inject 10 Units into the skin 3 (three) times daily. 9 mL 0   No facility-administered medications prior to visit.     ROS Review of Systems  Constitutional: Negative for activity change, appetite change and fatigue.  HENT: Negative for congestion, sinus pressure and sore throat.   Eyes: Negative for visual disturbance.  Respiratory: Negative for cough, chest tightness, shortness of breath and wheezing.   Cardiovascular: Negative for chest pain and palpitations.  Gastrointestinal: Negative for abdominal distention, abdominal pain and constipation.  Endocrine: Negative for polydipsia.  Genitourinary: Negative for dysuria and frequency.  Musculoskeletal: Negative for arthralgias and back pain.  Skin: Negative for rash.  Neurological: Negative for tremors, light-headedness and numbness.  Hematological: Does not bruise/bleed easily.  Psychiatric/Behavioral: Positive for dysphoric mood. Negative for agitation and behavioral problems.    Objective:  BP 90/60   Pulse (!) 119   Wt 142 lb 9.6 oz (64.7 kg)   SpO2 99%   BMI 23.02 kg/m   BP/Weight 10/14/2020 08/12/2020 16/04/9603  Systolic BP 90 540 981  Diastolic BP 60 77 98  Wt. (Lbs) 142.6 133.16 140  BMI 23.02 21.49 22.6      Physical Exam Constitutional:      Appearance: She is well-developed.  Neck:     Vascular: No JVD.  Cardiovascular:     Rate and Rhythm: Tachycardia present.     Heart sounds: Normal heart sounds. No murmur heard.   Pulmonary:     Effort: Pulmonary effort is normal.     Breath  sounds: Normal breath sounds. No wheezing or rales.  Chest:     Chest wall: No tenderness.  Abdominal:     General: Bowel sounds are normal. There is no distension.     Palpations: Abdomen is soft. There is no mass.     Tenderness: There is no abdominal tenderness.  Musculoskeletal:     Comments: Right foot in a boot.  Neurological:     Mental Status: She is alert and oriented to person, place, and time.  Psychiatric:     Comments: Dysphoric mood     CMP Latest Ref Rng & Units 08/11/2020 08/10/2020 08/09/2020  Glucose 70 -  99 mg/dL 180(H) 148(H) 180(H)  BUN 6 - 20 mg/dL 13 13 13   Creatinine 0.44 - 1.00 mg/dL 0.48 0.53 0.47  Sodium 135 - 145 mmol/L 135 139 137  Potassium 3.5 - 5.1 mmol/L 3.9 3.8 4.1  Chloride 98 - 111 mmol/L 100 101 101  CO2 22 - 32 mmol/L 27 29 27   Calcium 8.9 - 10.3 mg/dL 8.0(L) 7.9(L) 8.3(L)  Total Protein 6.5 - 8.1 g/dL - - -  Total Bilirubin 0.3 - 1.2 mg/dL - - -  Alkaline Phos 38 - 126 U/L - - -  AST 15 - 41 U/L - - -  ALT 0 - 44 U/L - - -    Lipid Panel     Component Value Date/Time   CHOL 197 03/15/2019 1056   TRIG 156 (H) 07/29/2020 0730   HDL 36 (L) 03/15/2019 1056   CHOLHDL 5.5 (H) 03/15/2019 1056   CHOLHDL 7.6 04/10/2016 0714   VLDL 65 (H) 04/10/2016 0714   LDLCALC 109 (H) 03/15/2019 1056    CBC    Component Value Date/Time   WBC 8.5 10/14/2020 1602   WBC 10.9 (H) 08/11/2020 0136   RBC 4.41 10/14/2020 1602   RBC 2.71 (L) 08/11/2020 0136   HGB 13.8 10/14/2020 1602   HCT 40.1 10/14/2020 1602   PLT 295 10/14/2020 1602   MCV 91 10/14/2020 1602   MCH 31.3 10/14/2020 1602   MCH 31.4 08/11/2020 0136   MCHC 34.4 10/14/2020 1602   MCHC 33.7 08/11/2020 0136   RDW 12.7 10/14/2020 1602   LYMPHSABS 3.5 (H) 10/14/2020 1602   MONOABS 0.8 08/06/2020 0757   EOSABS 0.2 10/14/2020 1602   BASOSABS 0.0 10/14/2020 1602    Lab Results  Component Value Date   HGBA1C 12.3 (H) 07/29/2020    Assessment & Plan:  1. Type 2 diabetes mellitus with  diabetic autonomic neuropathy, with long-term current use of insulin (HCC) Uncontrolled with A1c of 12.3; goal is less than 7.0 There is a discrepancy between 26 units of Lantus which appears on her medication list and 50 units which she states she is taking Her blood sugar today is 118 in the clinic 50 units of Lantus prescribed as per patient report. Counseled on Diabetic diet, my plate method, 765 minutes of moderate intensity exercise/week Blood sugar logs with fasting goals of 80-120 mg/dl, random of less than 180 and in the event of sugars less than 60 mg/dl or greater than 400 mg/dl encouraged to notify the clinic. Advised on the need for annual eye exams, annual foot exams, Pneumonia vaccine. - POCT glucose (manual entry) - DULoxetine (CYMBALTA) 60 MG capsule; Take 1 capsule (60 mg total) by mouth 2 (two) times daily.  Dispense: 60 capsule; Refill: 3 - gabapentin (NEURONTIN) 600 MG tablet; TAKE ONE TABLET (600 MG) BY MOUTH EVERY MORNING AND AFTERNOON, TAKE TWO TABLETS AT NIGHT  Dispense: 120 tablet; Refill: 3 - insulin glargine (LANTUS SOLOSTAR) 100 UNIT/ML Solostar Pen; Inject 50 Units into the skin at bedtime.  Dispense: 30 mL; Refill: 3 - HUMALOG KWIKPEN 100 UNIT/ML KwikPen; Inject 0-12 Units into the skin 3 (three) times daily. Per sliding scale  Dispense: 30 mL; Refill: 3  2. Moderate episode of recurrent major depressive disorder ([place]) Uncontrolled due to running out of medications Underlying psychosocial stressors also contributing I have had the social worker and case manager meet with her to discuss available resources and psychotherapy - DULoxetine (CYMBALTA) 60 MG capsule; Take 1 capsule (60 mg total) by mouth  2 (two) times daily.  Dispense: 60 capsule; Refill: 3  3. Adrenal insufficiency (HCC) Refilled Cortef - hydrocortisone (CORTEF) 10 MG tablet; Take 1 tablet (10 mg total) by mouth daily at 10 pm.  Dispense: 30 tablet; Refill: 3  4. Orthostatic  hypotension Significantly low blood pressure due to running out of midodrine which I have refilled - midodrine (PROAMATINE) 5 MG tablet; Take 1 tablet (5 mg total) by mouth 3 (three) times daily with meals.  Dispense: 90 tablet; Refill: 3  5. Sinus tachycardia Hopefully once her blood pressure improves I will place her on a low-dose beta-blocker  6. Anemia, unspecified type Last hemoglobin was 8.5, will repeat today - CBC with Differential/Platelet    Meds ordered this encounter  Medications  . DULoxetine (CYMBALTA) 60 MG capsule    Sig: Take 1 capsule (60 mg total) by mouth 2 (two) times daily.    Dispense:  60 capsule    Refill:  3  . gabapentin (NEURONTIN) 600 MG tablet    Sig: TAKE ONE TABLET (600 MG) BY MOUTH EVERY MORNING AND AFTERNOON, TAKE TWO TABLETS AT NIGHT    Dispense:  120 tablet    Refill:  3  . midodrine (PROAMATINE) 5 MG tablet    Sig: Take 1 tablet (5 mg total) by mouth 3 (three) times daily with meals.    Dispense:  90 tablet    Refill:  3  . hydrocortisone (CORTEF) 10 MG tablet    Sig: Take 1 tablet (10 mg total) by mouth daily at 10 pm.    Dispense:  30 tablet    Refill:  3  . insulin glargine (LANTUS SOLOSTAR) 100 UNIT/ML Solostar Pen    Sig: Inject 50 Units into the skin at bedtime.    Dispense:  30 mL    Refill:  3  . HUMALOG KWIKPEN 100 UNIT/ML KwikPen    Sig: Inject 0-12 Units into the skin 3 (three) times daily. Per sliding scale    Dispense:  30 mL    Refill:  3    Needs today    Follow-up: Return in about 2 days (around 10/16/2020) for medication reconciliation; 1 month with PCP.       Charlott Rakes, MD, FAAFP. [facility] [place], [place]   10/15/2020, 8:21 AM

## 2020-10-17 ENCOUNTER — Ambulatory Visit: Payer: Medicaid Other | Admitting: Pharmacist

## 2020-10-21 ENCOUNTER — Ambulatory Visit: Payer: Medicaid Other | Admitting: Pharmacist

## 2020-10-21 ENCOUNTER — Other Ambulatory Visit: Payer: Self-pay

## 2020-10-21 ENCOUNTER — Ambulatory Visit: Payer: Self-pay | Attending: Family Medicine

## 2020-10-21 MED FILL — Midodrine HCl Tab 5 MG: ORAL | 30 days supply | Qty: 90 | Fill #0 | Status: AC

## 2020-10-28 ENCOUNTER — Ambulatory Visit: Payer: Medicaid Other

## 2020-11-15 ENCOUNTER — Other Ambulatory Visit: Payer: Self-pay

## 2020-11-15 MED FILL — Duloxetine HCl Enteric Coated Pellets Cap 60 MG (Base Eq): ORAL | 30 days supply | Qty: 60 | Fill #0 | Status: AC

## 2020-11-15 MED FILL — Gabapentin Tab 600 MG: ORAL | 30 days supply | Qty: 120 | Fill #0 | Status: AC

## 2020-11-25 ENCOUNTER — Other Ambulatory Visit: Payer: Self-pay

## 2020-11-25 ENCOUNTER — Ambulatory Visit: Payer: Self-pay | Attending: Family Medicine | Admitting: Family Medicine

## 2020-11-25 ENCOUNTER — Encounter: Payer: Self-pay | Admitting: Family Medicine

## 2020-11-25 VITALS — BP 99/62 | HR 118 | Ht 66.0 in | Wt 135.6 lb

## 2020-11-25 DIAGNOSIS — M2550 Pain in unspecified joint: Secondary | ICD-10-CM

## 2020-11-25 DIAGNOSIS — R000 Tachycardia, unspecified: Secondary | ICD-10-CM

## 2020-11-25 DIAGNOSIS — E1143 Type 2 diabetes mellitus with diabetic autonomic (poly)neuropathy: Secondary | ICD-10-CM

## 2020-11-25 DIAGNOSIS — Z91199 Patient's noncompliance with other medical treatment and regimen due to unspecified reason: Secondary | ICD-10-CM

## 2020-11-25 DIAGNOSIS — Z9119 Patient's noncompliance with other medical treatment and regimen: Secondary | ICD-10-CM

## 2020-11-25 DIAGNOSIS — I951 Orthostatic hypotension: Secondary | ICD-10-CM

## 2020-11-25 DIAGNOSIS — G253 Myoclonus: Secondary | ICD-10-CM

## 2020-11-25 DIAGNOSIS — G729 Myopathy, unspecified: Secondary | ICD-10-CM

## 2020-11-25 DIAGNOSIS — M549 Dorsalgia, unspecified: Secondary | ICD-10-CM

## 2020-11-25 DIAGNOSIS — Z794 Long term (current) use of insulin: Secondary | ICD-10-CM

## 2020-11-25 LAB — POCT GLYCOSYLATED HEMOGLOBIN (HGB A1C): HbA1c, POC (controlled diabetic range): 13.6 % — AB (ref 0.0–7.0)

## 2020-11-25 LAB — GLUCOSE, POCT (MANUAL RESULT ENTRY): POC Glucose: 295 mg/dl — AB (ref 70–99)

## 2020-11-25 MED ORDER — MIDODRINE HCL 10 MG PO TABS
10.0000 mg | ORAL_TABLET | Freq: Three times a day (TID) | ORAL | 3 refills | Status: DC
  Filled 2020-11-25: qty 90, 30d supply, fill #0

## 2020-11-25 MED ORDER — MELOXICAM 7.5 MG PO TABS
7.5000 mg | ORAL_TABLET | Freq: Every day | ORAL | 1 refills | Status: DC
  Filled 2020-11-25: qty 30, 30d supply, fill #0

## 2020-11-25 MED ORDER — TIZANIDINE HCL 4 MG PO TABS
4.0000 mg | ORAL_TABLET | Freq: Three times a day (TID) | ORAL | 1 refills | Status: DC | PRN
  Filled 2020-11-25: qty 90, 30d supply, fill #0

## 2020-11-25 NOTE — Progress Notes (Signed)
Having pain in knee,back and shoulders.

## 2020-11-25 NOTE — Progress Notes (Signed)
Subjective:  Patient ID: [name] [name], female    DOB: 02-Mar-1961  Age: 60 y.o. MRN: [id number]  CC: Diabetes   HPI [name] [name]  is a 60 year old female patient of Dr. Chapman Fitch with a history of type 2 diabetes mellitus (A1c 13.6), orthostatic hypotension, adrenal insufficiency, noncompliance, Depression who presents today for follow-up visit.  She complains of tingling in her hands and feet which is uncontrolled on her current medications.  At her last visit she had been out of all her medications and gabapentin and Cymbalta were refilled. She has been forgetting to administer her Lantus as she sometimes falls asleep and her A1c is 13.6 up from 12.3 previously.  Pain occurs in her knees, shoulder and lower back and Ibuprofen provides relief on some occasions.  She has to use a right knee brace. She falls all the time at home as a result of her legs and arms jerking with associated tremors and being wobbly.  She ambulates with the aid of a walker. She remains tachycardic and her blood pressure is on the low side.  Informs me she has been compliant with Midodrine and Cortef.  Past Medical History:  Diagnosis Date  . Cervical disc disorder with radiculopathy of cervical region   . Chronic pain syndrome   . Degenerative joint disease   . Depression dx 1997  . Diabetes mellitus, type 2 ([place]) 2011   No insulin  . Diabetic polyneuropathy ([place])   . GERD (gastroesophageal reflux disease)   . Glaucoma   . Headache(784.0)   . Hypertension    Lab 11/2011:  CXR, EKG, CBC, TSH, BMet, troponin-normal; lipid profile: 188, 131, 36, 126   . Lumbar herniated disc   . Non-compliance   . Pancreatic cyst    Endoscopic aspiration in 09/2009  . Pneumonia 08/02/2012  . Shingles   . 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 Torn meniscus   . Vertigo     Past Surgical History:  Procedure Laterality Date  . ABDOMINAL HYSTERECTOMY    . CESAREAN SECTION    . ORIF ANKLE FRACTURE Right 02/25/2020    Procedure: OPEN REDUCTION INTERNAL FIXATION (ORIF) ANKLE FRACTURE;  Surgeon: Lovell Sheehan, MD;  Location: [facility] ORS;  Service: Orthopedics;  Laterality: Right;    Family History  Problem Relation Age of Onset  . Depression Mother        type 1  . Diabetes Mother   . Renal Disease Mother   . Lung cancer Father   . Heart attack Brother        Died age 5 - was told due to massive MI/heart "exploded"  . Heart failure Maternal Grandmother        Details not totally clear    Allergies  Allergen Reactions  . Tramadol Nausea And Vomiting    REACTION: Projectile vomiting    Outpatient Medications Prior to Visit  Medication Sig Dispense Refill  . acetaminophen (TYLENOL) 500 MG tablet Take 2 tablets (1,000 mg total) by mouth every 6 (six) hours as needed for mild pain or headache (pain).  0  . Blood Glucose Monitoring Suppl (TRUE METRIX GO GLUCOSE METER) w/Device KIT USE AS INSTRUCTED TO CHECK BLOOD SUGAR ONCE DAILY. 1 kit 0  . Blood Glucose Monitoring Suppl (TRUE METRIX METER) w/Device KIT Use as instructed to check blood sugar once daily. 1 kit 0  . Cholecalciferol (VITAMIN D-3) 125 MCG (5000 UT) TABS Take 1 tablet by mouth  daily. 90 tablet 3  . diclofenac Sodium (VOLTAREN) 1 % GEL Apply 1 application topically 4 (four) times daily. 100 g 0  . DULoxetine (CYMBALTA) 60 MG capsule Take 1 capsule (60 mg total) by mouth 2 (two) times daily. 60 capsule 3  . DULoxetine (CYMBALTA) 60 MG capsule TAKE 1 CAPSULE (60 MG TOTAL) BY MOUTH TWO TIMES DAILY. 60 capsule 0  . DULoxetine (CYMBALTA) 60 MG capsule TAKE 1 CAPSULE (60 MG TOTAL) BY MOUTH 2 (TWO) TIMES DAILY. 60 capsule 3  . DULoxetine (CYMBALTA) 60 MG capsule TAKE 1 CAPSULE (60 MG TOTAL) BY MOUTH 2 (TWO) TIMES DAILY. 60 capsule 3  . feeding supplement, GLUCERNA SHAKE, (GLUCERNA SHAKE) LIQD TAKE 237 MLS BY MOUTH 2 (TWO) TIMES DAILY BETWEEN MEALS. 24268 mL 0  . gabapentin (NEURONTIN) 600 MG tablet TAKE ONE TABLET (600 MG) BY MOUTH EVERY MORNING  AND AFTERNOON, TAKE TWO TABLETS AT NIGHT 120 tablet 3  . gabapentin (NEURONTIN) 600 MG tablet TAKE ONE TABLET (600 MG) BY MOUTH EVERY MORNING AND AFTERNOON, TAKE TWO TABLETS AT NIGHT 120 tablet 0  . gabapentin (NEURONTIN) 600 MG tablet TAKE ONE TABLET (600 MG) BY MOUTH EVERY MORNING AND AFTERNOON, TAKE TWO TABLETS AT NIGHT 120 tablet 3  . gabapentin (NEURONTIN) 600 MG tablet TAKE ONE TABLET (600 MG) BY MOUTH EVERY MORNING AND AFTERNOON, TAKE TWO TABLETS AT NIGHT 120 tablet 2  . gabapentin (NEURONTIN) 600 MG tablet TAKE ONE TABLET (600 MG) BY MOUTH EVERY MORNING AND AFTERNOON, TAKE TWO TABLETS AT NIGHT 120 tablet 2  . glucose blood (TRUE METRIX BLOOD GLUCOSE TEST) test strip Use as instructed to check blood sugar once daily. 100 each 2  . HUMALOG KWIKPEN 100 UNIT/ML KwikPen Inject 0-12 Units into the skin 3 (three) times daily. Per sliding scale 30 mL 3  . HUMALOG KWIKPEN 100 UNIT/ML KwikPen INJECT 10 UNITS INTO THE SKIN THREE TIMES DAILY. 9 mL 0  . hydrocortisone (CORTEF) 20 MG tablet TAKE 1 TABLET (20 MG TOTAL) BY MOUTH EVERY MORNING. 30 tablet 0  . Insulin Glargine (BASAGLAR KWIKPEN) 100 UNIT/ML INJECT 50 UNITS INTO THE SKIN AT BEDTIME. 30 mL 3  . insulin glargine (LANTUS SOLOSTAR) 100 UNIT/ML Solostar Pen Inject 50 Units into the skin at bedtime. 30 mL 3  . insulin glargine (LANTUS) 100 UNIT/ML Solostar Pen INJECT 26 UNITS INTO THE SKIN DAILY. 15 mL 0  . insulin lispro (HUMALOG) 100 UNIT/ML KwikPen INJECT 0-12 UNITS INTO THE SKIN 3 (THREE) TIMES DAILY. PER SLIDING SCALE 30 mL 3  . Insulin Pen Needle (PEN NEEDLES) 31G X 8 MM MISC Use as directed 100 each 6  . Insulin Pen Needle 31G X 8 MM MISC USE AS DIRECTED 100 each 6  . midodrine (PROAMATINE) 5 MG tablet Take 1 tablet (5 mg total) by mouth 3 (three) times daily with meals. 90 tablet 3  . midodrine (PROAMATINE) 5 MG tablet TAKE 1 TABLET (5 MG TOTAL) BY MOUTH THREE TIMES DAILY WITH MEALS. 90 tablet 0  . midodrine (PROAMATINE) 5 MG tablet TAKE 1  TABLET (5 MG TOTAL) BY MOUTH 3 (THREE) TIMES DAILY WITH MEALS. 90 tablet 3  . atorvastatin (LIPITOR) 20 MG tablet TAKE 1 TABLET (20 MG TOTAL) BY MOUTH DAILY. (Patient not taking: Reported on 11/25/2020) 90 tablet 3  . hydrocortisone (CORTEF) 10 MG tablet Take 1 tablet (10 mg total) by mouth daily at 10 pm. (Patient not taking: Reported on 11/25/2020) 30 tablet 3  . hydrocortisone (CORTEF) 10 MG tablet TAKE 1  TABLET (10 MG TOTAL) BY MOUTH DAILY AT 10 PM. (Patient not taking: Reported on 11/25/2020) 30 tablet 0  . hydrocortisone (CORTEF) 10 MG tablet TAKE 1 TABLET (10 MG TOTAL) BY MOUTH DAILY AT 10 PM. (Patient not taking: Reported on 11/25/2020) 30 tablet 3  . Multiple Vitamins-Minerals (CENTRUM SILVER 50+MEN) TABS Take 1 tablet by mouth daily. (Patient not taking: Reported on 11/25/2020)     No facility-administered medications prior to visit.     ROS Review of Systems  Constitutional: Negative for activity change, appetite change and fatigue.  HENT: Negative for congestion, sinus pressure and sore throat.   Eyes: Negative for visual disturbance.  Respiratory: Negative for cough, chest tightness, shortness of breath and wheezing.   Cardiovascular: Negative for chest pain and palpitations.  Gastrointestinal: Negative for abdominal distention, abdominal pain and constipation.  Endocrine: Negative for polydipsia.  Genitourinary: Negative for dysuria and frequency.  Musculoskeletal: Positive for arthralgias, back pain and gait problem.  Skin: Negative for rash.  Neurological: Positive for tremors, weakness and numbness. Negative for light-headedness.  Hematological: Does not bruise/bleed easily.  Psychiatric/Behavioral: Negative for agitation and behavioral problems.    Objective:  BP 99/62   Pulse (!) 118   Ht _0  (1.676 m)   Wt 135 lb 9.6 oz (61.5 kg)   SpO2 99%   BMI 21.89 kg/m   BP/Weight 11/25/2020 10/14/2020 2/70/6237  Systolic BP 99 90 628  Diastolic BP 62 60 77  Wt. (Lbs) 135.6 142.6  133.16  BMI 21.89 23.02 21.49      Physical Exam Constitutional:      Appearance: She is well-developed.  Neck:     Vascular: No JVD.  Cardiovascular:     Rate and Rhythm: Tachycardia present.     Heart sounds: Normal heart sounds. No murmur heard.   Pulmonary:     Effort: Pulmonary effort is normal.     Breath sounds: Normal breath sounds. No wheezing or rales.  Chest:     Chest wall: No tenderness.  Abdominal:     General: Bowel sounds are normal. There is no distension.     Palpations: Abdomen is soft. There is no mass.     Tenderness: There is no abdominal tenderness.  Musculoskeletal:        General: Normal range of motion.     Right lower leg: No edema.     Left lower leg: No edema.     Comments: Slight tenderness on range of motion of right knee No tenderness on palpation of thoracolumbar spine Mildly decreased range of motion of R shoulder, L is normal.  Neurological:     Mental Status: She is alert and oriented to person, place, and time.  Psychiatric:        Mood and Affect: Mood normal.     CMP Latest Ref Rng & Units 08/11/2020 08/10/2020 08/09/2020  Glucose 70 - 99 mg/dL 180(H) 148(H) 180(H)  BUN 6 - 20 mg/dL _1 Creatinine 0.44 - 1.00 mg/dL 0.48 0.53 0.47  Sodium 135 - 145 mmol/L 135 139 137  Potassium 3.5 - 5.1 mmol/L 3.9 3.8 4.1  Chloride 98 - 111 mmol/L 100 101 101  CO2 22 - 32 mmol/L _2 Calcium 8.9 - 10.3 mg/dL 8.0(L) 7.9(L) 8.3(L)  Total Protein 6.5 - 8.1 g/dL - - -  Total Bilirubin 0.3 - 1.2 mg/dL - - -  Alkaline Phos 38 - 126 U/L - - -  AST 15 - 41 U/L - - -  ALT 0 - 44 U/L - - -    Lipid Panel     Component Value Date/Time   CHOL 197 03/15/2019 1056   TRIG 156 (H) 07/29/2020 0730   HDL 36 (L) 03/15/2019 1056   CHOLHDL 5.5 (H) 03/15/2019 1056   CHOLHDL 7.6 04/10/2016 0714   VLDL 65 (H) 04/10/2016 0714   LDLCALC 109 (H) 03/15/2019 1056    CBC    Component Value Date/Time   WBC 8.5 10/14/2020 1602   WBC 10.9 (H)  08/11/2020 0136   RBC 4.41 10/14/2020 1602   RBC 2.71 (L) 08/11/2020 0136   HGB 13.8 10/14/2020 1602   HCT 40.1 10/14/2020 1602   PLT 295 10/14/2020 1602   MCV 91 10/14/2020 1602   MCH 31.3 10/14/2020 1602   MCH 31.4 08/11/2020 0136   MCHC 34.4 10/14/2020 1602   MCHC 33.7 08/11/2020 0136   RDW 12.7 10/14/2020 1602   LYMPHSABS 3.5 (H) 10/14/2020 1602   MONOABS 0.8 08/06/2020 0757   EOSABS 0.2 10/14/2020 1602   BASOSABS 0.0 10/14/2020 1602    Lab Results  Component Value Date   HGBA1C 13.6 (A) 11/25/2020    Assessment & Plan:  1. Type 2 diabetes mellitus with diabetic autonomic neuropathy, with long-term current use of insulin (HCC) Uncontrolled with A1c of 13.6 due to noncompliance; goal is less than 7.0 Discussed strategies to aid compliance including using timers Neuropathy is uncontrolled due to ongoing hyperglycemia.  Consider switching to Lyrica if neuropathy persists. Regimen change today as she is not compliant.  We have discussed complications of noncompliance and complications of diabetes. Counseled on Diabetic diet, my plate method, 967 minutes of moderate intensity exercise/week Blood sugar logs with fasting goals of 80-120 mg/dl, random of less than 180 and in the event of sugars less than 60 mg/dl or greater than 400 mg/dl encouraged to notify the clinic. Advised on the need for annual eye exams, annual foot exams, Pneumonia vaccine. - POCT glucose (manual entry) - POCT glycosylated hemoglobin (Hb A1C)  2. Sinus tachycardia Last thyroid labs ordered last year were normal She has chronic sinus tachycardia Would love to place on a beta-blocker but hypotension precludes this If her blood pressure improves with increasing dose of midodrine I will consider initiating metoprolol at her next visit - T4, free - TSH  3. Arthralgia of multiple joints Uncontrolled Placed on meloxicam - Ambulatory referral to Physical Therapy  4. 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 Noncompliance We have discussed her ongoing noncompliance and complications of diabetes.  She does not seem to have a full grasp of the implications of her actions which is concerning  6. 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 Orthostatic hypotension Uncontrolled Increased dose of midodrine - midodrine (PROAMATINE) 10 MG tablet; Take 1 tablet (10 mg total) by mouth 3 (three) times daily with meals.  Dispense: 90 tablet; Refill: 3  8. Musculoskeletal back pain Placed on meloxicam and tizanidine Fall precautions - tiZANidine (ZANAFLEX) 4 MG tablet; Take 1 tablet (4 mg total) by mouth every 8 (eight) hours as needed for muscle spasms.  Dispense: 90 tablet; Refill: 1 - meloxicam (MOBIC) 7.5 MG tablet; Take 1 tablet (7.5 mg total) by mouth daily.  Dispense: 30 tablet; Refill: 1    No orders of the defined types were placed  in this encounter.   Follow-up: No follow-ups on file.       Charlott Rakes, MD, FAAFP. [facility] and [place], [place]   11/25/2020, 2:41 PM

## 2020-11-26 LAB — T4, FREE: Free T4: 1.23 ng/dL (ref 0.82–1.77)

## 2020-11-26 LAB — TSH: TSH: 2.62 u[IU]/mL (ref 0.450–4.500)

## 2020-12-02 ENCOUNTER — Other Ambulatory Visit: Payer: Self-pay

## 2020-12-13 ENCOUNTER — Other Ambulatory Visit: Payer: Self-pay

## 2020-12-13 MED FILL — Gabapentin Tab 600 MG: ORAL | 30 days supply | Qty: 120 | Fill #1 | Status: AC

## 2020-12-18 ENCOUNTER — Ambulatory Visit: Payer: Medicaid Other | Attending: Family Medicine | Admitting: Physical Therapy

## 2020-12-23 IMAGING — DX DG TOE GREAT 2+V*L*
3 series · 4 of 4 positions shown · non-contrast
Comparison: None.

CLINICAL DATA: Tripped in yard yesterday. Great toe pain and
swelling. Initial encounter.

EXAM:
LEFT GREAT TOE

[toe ap]
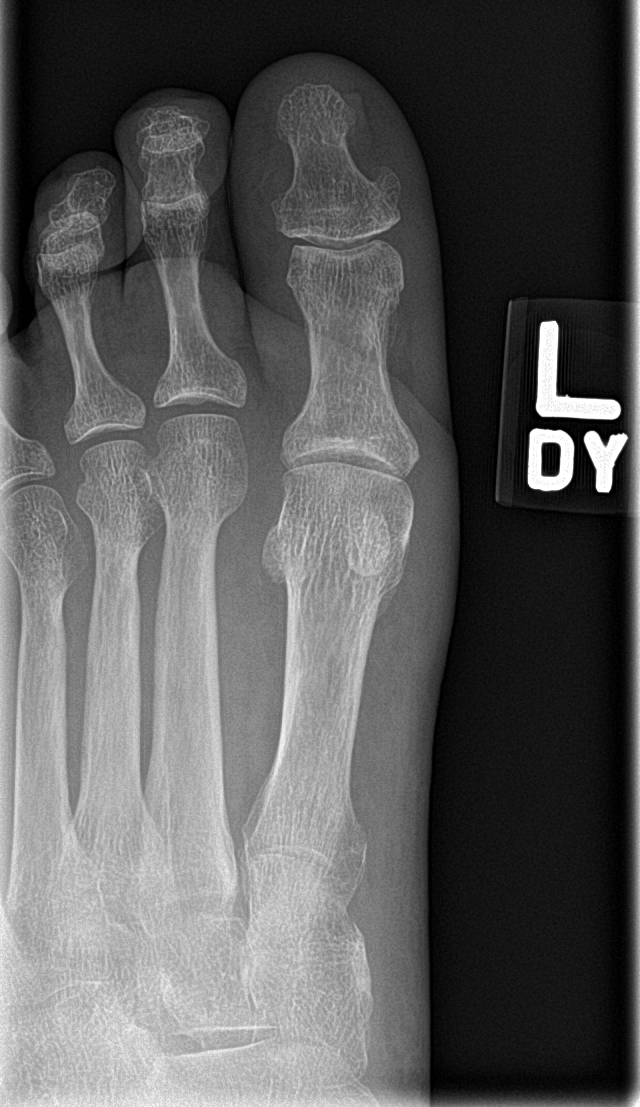

[toe obl]
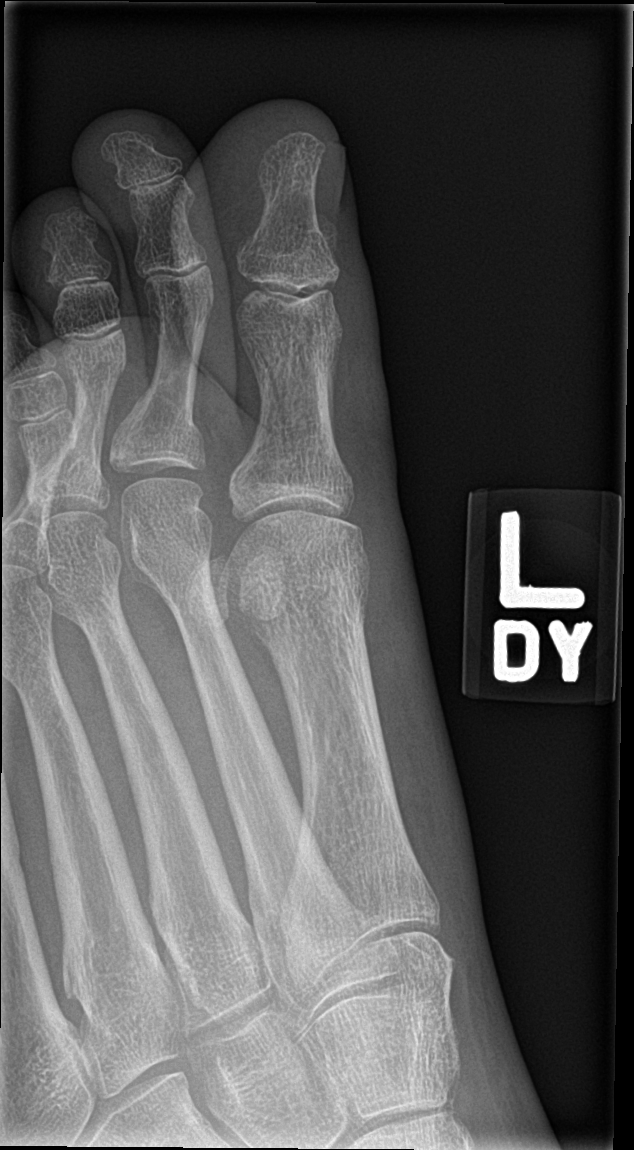

[Series 3: toe lat · 0.14mm/px · 2 of 2 slices shown]
[im 1/2]
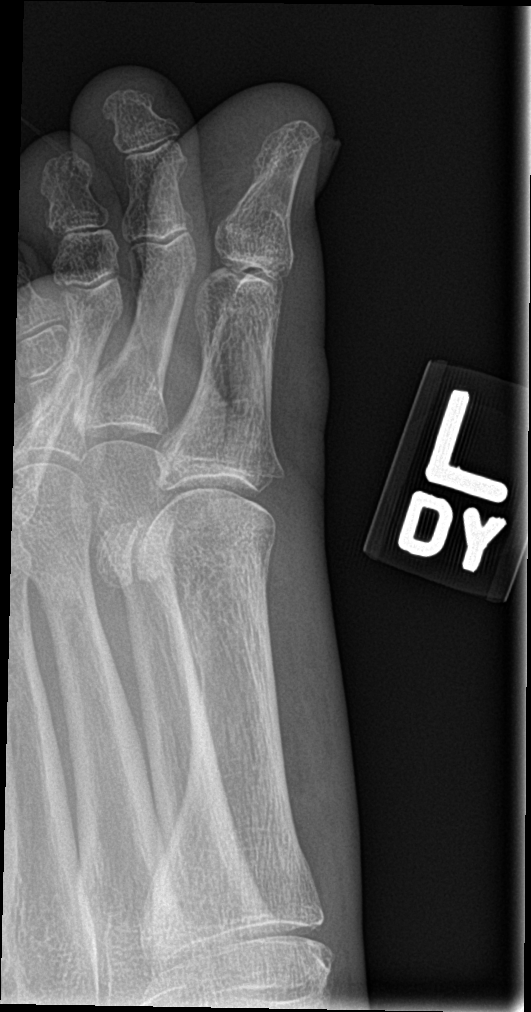
[im 2/2]
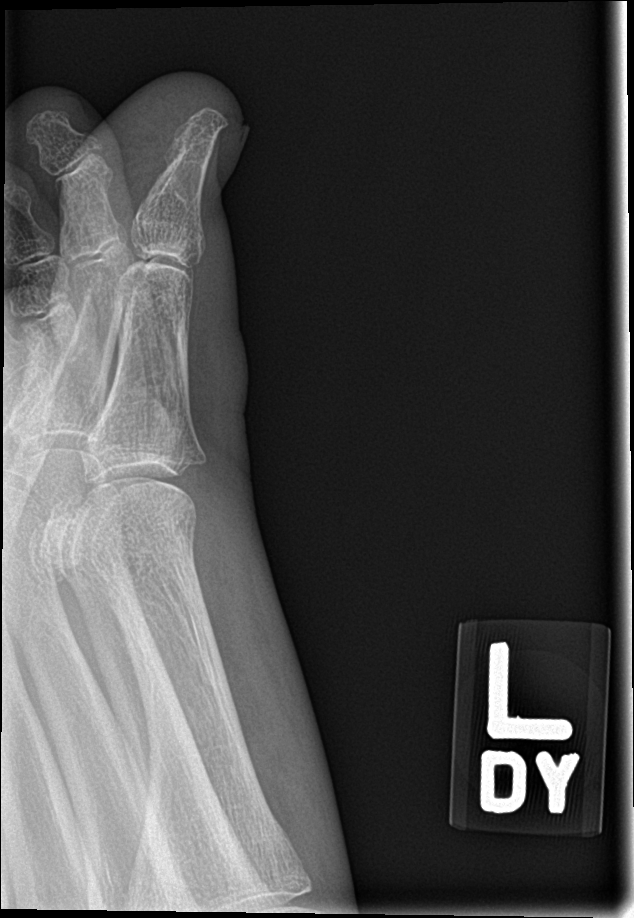

[4 of 4 positions shown; findings below may reference images not displayed]

FINDINGS: There is no evidence of fracture or dislocation. There is no
evidence of arthropathy or other focal bone abnormality. Soft
tissues are unremarkable.
IMPRESSION: Negative.

## 2021-01-10 ENCOUNTER — Other Ambulatory Visit: Payer: Self-pay

## 2021-01-10 MED FILL — Gabapentin Tab 600 MG: ORAL | 30 days supply | Qty: 120 | Fill #2 | Status: AC

## 2021-01-10 MED FILL — Duloxetine HCl Enteric Coated Pellets Cap 60 MG (Base Eq): ORAL | 30 days supply | Qty: 60 | Fill #1 | Status: AC

## 2021-01-13 ENCOUNTER — Other Ambulatory Visit: Payer: Self-pay

## 2021-02-07 ENCOUNTER — Other Ambulatory Visit: Payer: Self-pay | Admitting: Family Medicine

## 2021-02-07 ENCOUNTER — Other Ambulatory Visit: Payer: Self-pay

## 2021-02-07 MED ORDER — GABAPENTIN 600 MG PO TABS
ORAL_TABLET | ORAL | 0 refills | Status: DC
  Filled 2021-02-07: qty 120, 30d supply, fill #0

## 2021-02-07 MED FILL — Duloxetine HCl Enteric Coated Pellets Cap 60 MG (Base Eq): ORAL | 30 days supply | Qty: 60 | Fill #2 | Status: AC

## 2021-02-25 ENCOUNTER — Ambulatory Visit: Payer: Self-pay | Attending: Family Medicine | Admitting: Family Medicine

## 2021-02-25 ENCOUNTER — Encounter: Payer: Self-pay | Admitting: Family Medicine

## 2021-02-25 ENCOUNTER — Other Ambulatory Visit: Payer: Self-pay

## 2021-02-25 DIAGNOSIS — Z794 Long term (current) use of insulin: Secondary | ICD-10-CM

## 2021-02-25 DIAGNOSIS — G729 Myopathy, unspecified: Secondary | ICD-10-CM

## 2021-02-25 DIAGNOSIS — E1143 Type 2 diabetes mellitus with diabetic autonomic (poly)neuropathy: Secondary | ICD-10-CM

## 2021-02-25 DIAGNOSIS — G253 Myoclonus: Secondary | ICD-10-CM

## 2021-02-25 NOTE — Progress Notes (Signed)
Virtual Visit via Telephone Note  I connected with [name] [name], on 02/25/2021 at 1:34 PM by telephone due to the COVID-19 pandemic and verified that I am speaking with the correct person using two identifiers.   Consent: I discussed the limitations, risks, security and privacy concerns of performing an evaluation and management service by telephone and the availability of in person appointments. I also discussed with the patient that there may be a patient responsible charge related to this service. The patient expressed understanding and agreed to proceed.   Location of Patient: Home  Location of Provider: Clinic   Persons participating in Telemedicine visit: [name] [name] Dr. Margarita Rana     History of Present Illness: [name] [name] is a 60 y.o. year old female with a history of type 2 diabetes mellitus (A1c 13.6), orthostatic hypotension, adrenal insufficiency, noncompliance, Depression who presents today for follow-up visit.  Sometimes her fasting sugars are around 210. She is doing better with remembering to take her Lantus 50 units in the middle of the day and does Novolog 20 units with dinner and breakfast. At her last visit I have placed her on a sliding scale which she has not been adhering to.  Continues to experience myalgias, tremors.  She is on Cymbalta and gabapentin for her diabetic neuropathy.  I had referred her to neurology for her jerking and tremors but referral notes state ' patient will call back when ready to schedule'.   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  0   atorvastatin (LIPITOR) 20 MG tablet TAKE 1 TABLET (20 MG TOTAL) BY MOUTH DAILY. (Patient not taking: Reported on 11/25/2020) 90 tablet 3   Blood Glucose Monitoring Suppl (TRUE METRIX GO GLUCOSE METER) w/Device KIT USE AS INSTRUCTED TO CHECK BLOOD SUGAR ONCE DAILY. 1 kit 0   Blood Glucose Monitoring Suppl (TRUE METRIX METER) w/Device KIT Use as instructed to check blood sugar once daily. 1 kit 0   Cholecalciferol (VITAMIN D-3) 125 MCG (5000 UT) TABS Take 1 tablet by mouth daily. 90 tablet 3   diclofenac Sodium (VOLTAREN) 1 % GEL Apply 1 application topically 4 (four) times daily. 100 g 0   DULoxetine (CYMBALTA) 60 MG capsule Take 1 capsule (60 mg total) by mouth 2 (two) times daily. 60 capsule 3   DULoxetine (CYMBALTA) 60 MG capsule TAKE 1 CAPSULE (  60 MG TOTAL) BY MOUTH TWO TIMES DAILY. 60 capsule 0   DULoxetine (CYMBALTA) 60 MG capsule TAKE 1 CAPSULE (60 MG TOTAL) BY MOUTH 2 (TWO) TIMES DAILY. 60 capsule 3   DULoxetine (CYMBALTA) 60 MG capsule TAKE 1 CAPSULE (60 MG TOTAL) BY MOUTH 2 (TWO) TIMES DAILY. 60 capsule 3   feeding supplement, GLUCERNA SHAKE, (GLUCERNA SHAKE) LIQD TAKE 237 MLS BY MOUTH 2 (TWO) TIMES DAILY BETWEEN MEALS. 35573 mL 0   gabapentin (NEURONTIN) 600 MG tablet TAKE ONE TABLET (600 MG) BY MOUTH EVERY MORNING AND AFTERNOON, TAKE TWO TABLETS AT NIGHT 120 tablet 3   gabapentin (NEURONTIN) 600 MG tablet TAKE ONE TABLET (600 MG) BY MOUTH  EVERY MORNING AND AFTERNOON, TAKE TWO TABLETS AT NIGHT 120 tablet 0   gabapentin (NEURONTIN) 600 MG tablet TAKE ONE TABLET (600 MG) BY MOUTH EVERY MORNING AND AFTERNOON, TAKE TWO TABLETS AT NIGHT 120 tablet 2   gabapentin (NEURONTIN) 600 MG tablet TAKE ONE TABLET (600 MG) BY MOUTH EVERY MORNING AND AFTERNOON, TAKE TWO TABLETS AT NIGHT 120 tablet 2   gabapentin (NEURONTIN) 600 MG tablet TAKE ONE TABLET (600 MG) BY MOUTH EVERY MORNING AND AFTERNOON, TAKE TWO TABLETS AT NIGHT 120 tablet 0   glucose blood (TRUE METRIX BLOOD GLUCOSE TEST) test strip Use as instructed to check blood sugar once daily. 100 each 2   HUMALOG KWIKPEN 100 UNIT/ML KwikPen Inject 0-12 Units into the skin 3 (three) times daily. Per sliding scale 30 mL 3   HUMALOG KWIKPEN 100 UNIT/ML KwikPen INJECT 10 UNITS INTO THE SKIN THREE TIMES DAILY. 9 mL 0   hydrocortisone (CORTEF) 10 MG tablet Take 1 tablet (10 mg total) by mouth daily at 10 pm. (Patient not taking: Reported on 11/25/2020) 30 tablet 3   hydrocortisone (CORTEF) 10 MG tablet TAKE 1 TABLET (10 MG TOTAL) BY MOUTH DAILY AT 10 PM. (Patient not taking: Reported on 11/25/2020) 30 tablet 0   hydrocortisone (CORTEF) 10 MG tablet TAKE 1 TABLET (10 MG TOTAL) BY MOUTH DAILY AT 10 PM. (Patient not taking: Reported on 11/25/2020) 30 tablet 3   hydrocortisone (CORTEF) 20 MG tablet TAKE 1 TABLET (20 MG TOTAL) BY MOUTH EVERY MORNING. 30 tablet 0   Insulin Glargine (BASAGLAR KWIKPEN) 100 UNIT/ML INJECT 50 UNITS INTO THE SKIN AT BEDTIME. 30 mL 3   insulin glargine (LANTUS SOLOSTAR) 100 UNIT/ML Solostar Pen Inject 50 Units into the skin at bedtime. 30 mL 3   insulin glargine (LANTUS) 100 UNIT/ML Solostar Pen INJECT 26 UNITS INTO THE SKIN DAILY. 15 mL 0   insulin lispro (HUMALOG) 100 UNIT/ML KwikPen INJECT 0-12 UNITS INTO THE SKIN 3 (THREE) TIMES DAILY. PER SLIDING SCALE 30 mL 3   Insulin Pen Needle (PEN NEEDLES) 31G X 8 MM MISC Use as directed 100 each 6   Insulin Pen Needle 31G X 8 MM MISC USE AS  DIRECTED 100 each 6   meloxicam (MOBIC) 7.5 MG tablet Take 1 tablet (7.5 mg total) by mouth daily. 30 tablet 1   midodrine (PROAMATINE) 10 MG tablet Take 1 tablet (10 mg total) by mouth 3 (three) times daily with meals. 90 tablet 3   midodrine (PROAMATINE) 5 MG tablet Take 1 tablet (5 mg total) by mouth 3 (three) times daily with meals. 90 tablet 3   midodrine (PROAMATINE) 5 MG tablet TAKE 1 TABLET (5 MG TOTAL) BY MOUTH 3 (THREE) TIMES DAILY WITH MEALS. 90 tablet 3   Multiple Vitamins-Minerals (CENTRUM SILVER 50+MEN) TABS Take 1 tablet by mouth  daily. (Patient not taking: Reported on 11/25/2020)     tiZANidine (ZANAFLEX) 4 MG tablet Take 1 tablet (4 mg total) by mouth every 8 (eight) hours as needed for muscle spasms. 90 tablet 1   No current facility-administered medications on file prior to visit.    ROS: See HPI  Observations/Objective: Awake, alert, oriented x3 Not in acute distress Normal mood  CMP Latest Ref Rng & Units 08/11/2020 08/10/2020 08/09/2020  Glucose 70 - 99 mg/dL 180(H) 148(H) 180(H)  BUN 6 - 20 mg/dL _0 Creatinine 0.44 - 1.00 mg/dL 0.48 0.53 0.47  Sodium 135 - 145 mmol/L 135 139 137  Potassium 3.5 - 5.1 mmol/L 3.9 3.8 4.1  Chloride 98 - 111 mmol/L 100 101 101  CO2 22 - 32 mmol/L _1 Calcium 8.9 - 10.3 mg/dL 8.0(L) 7.9(L) 8.3(L)  Total Protein 6.5 - 8.1 g/dL - - -  Total Bilirubin 0.3 - 1.2 mg/dL - - -  Alkaline Phos 38 - 126 U/L - - -  AST 15 - 41 U/L - - -  ALT 0 - 44 U/L - - -    Lipid Panel     Component Value Date/Time   CHOL 197 03/15/2019 1056   TRIG 156 (H) 07/29/2020 0730   HDL 36 (L) 03/15/2019 1056   CHOLHDL 5.5 (H) 03/15/2019 1056   CHOLHDL 7.6 04/10/2016 0714   VLDL 65 (H) 04/10/2016 0714   LDLCALC 109 (H) 03/15/2019 1056   LABVLDL 52 (H) 03/15/2019 1056    Lab Results  Component Value Date   HGBA1C 13.6 (A) 11/25/2020    Assessment and Plan: 1. Type 2 diabetes mellitus with diabetic autonomic neuropathy, with long-term  current use of insulin (HCC) Uncontrolled with A1c of 13.6 Will order A1c and adjust regimen accordingly She had been advised to increase her long-acting insulin to 55 units at bedtime and switched from a fixed regimen of NovoLog to sliding scale to prevent hypoglycemia Counseled on Diabetic diet, my plate method, 696 minutes of moderate intensity exercise/week Blood sugar logs with fasting goals of 80-120 mg/dl, random of less than 180 and in the event of sugars less than 60 mg/dl or greater than 400 mg/dl encouraged to notify the clinic. Advised on the need for annual eye exams, annual foot exams, Pneumonia vaccine. - Lipid panel; Future - Hemoglobin A1c; Future - Microalbumin / creatinine urine ratio; Future  2. Myopathy Uncontrolled Hyperglycemia contributing Currently on Cymbalta and gabapentin  3. Myoclonic jerking Uncontrolled Referred to neurology and per notes patient will be calling back to schedule   Follow Up Instructions: 3 months   I discussed the assessment and treatment plan with the patient. The patient was provided an opportunity to ask questions and all were answered. The patient agreed with the plan and demonstrated an understanding of the instructions.   The patient was advised to call back or seek an in-person evaluation if the symptoms worsen or if the condition fails to improve as anticipated.     I provided 12 minutes total of non-face-to-face time during this encounter.   Charlott Rakes, MD, FAAFP. [facility] and [place], [place]   02/25/2021, 1:34 PM

## 2021-03-06 ENCOUNTER — Other Ambulatory Visit: Payer: Self-pay | Admitting: Family Medicine

## 2021-03-06 NOTE — Telephone Encounter (Signed)
Requested medications are due for refill today yes  Requested medications are on the active medication list yes  Last refill 02/07/21  Last visit 02/25/21  Future visit scheduled no  Notes to clinic Current med list has duplicates of both of these meds, please assess.

## 2021-03-11 ENCOUNTER — Other Ambulatory Visit: Payer: Self-pay

## 2021-03-13 ENCOUNTER — Other Ambulatory Visit: Payer: Self-pay

## 2021-03-13 MED ORDER — GABAPENTIN 600 MG PO TABS
ORAL_TABLET | ORAL | 3 refills | Status: DC
  Filled 2021-03-13: qty 120, 30d supply, fill #0
  Filled 2021-04-09: qty 120, 30d supply, fill #1
  Filled 2021-05-15: qty 120, 30d supply, fill #2
  Filled 2021-06-16: qty 120, 30d supply, fill #3

## 2021-03-13 MED ORDER — DULOXETINE HCL 60 MG PO CPEP
ORAL_CAPSULE | ORAL | 3 refills | Status: DC
  Filled 2021-03-13: qty 60, 30d supply, fill #0
  Filled 2021-04-09: qty 60, 30d supply, fill #1
  Filled 2021-05-15: qty 60, 30d supply, fill #2
  Filled 2021-06-16: qty 60, 30d supply, fill #3

## 2021-04-09 ENCOUNTER — Other Ambulatory Visit: Payer: Self-pay

## 2021-04-14 ENCOUNTER — Other Ambulatory Visit: Payer: Self-pay

## 2021-05-15 ENCOUNTER — Other Ambulatory Visit: Payer: Self-pay

## 2021-05-16 ENCOUNTER — Other Ambulatory Visit: Payer: Self-pay

## 2021-06-16 ENCOUNTER — Other Ambulatory Visit: Payer: Self-pay

## 2021-06-17 ENCOUNTER — Emergency Department (HOSPITAL_COMMUNITY): Payer: Medicaid Other

## 2021-06-17 ENCOUNTER — Encounter (HOSPITAL_COMMUNITY): Payer: Self-pay | Admitting: Emergency Medicine

## 2021-06-17 ENCOUNTER — Inpatient Hospital Stay (HOSPITAL_COMMUNITY)
Admission: EM | Admit: 2021-06-17 | Discharge: 2021-06-19 | DRG: 871 | Disposition: A | Discharge status: Home / Self Care | Payer: Medicaid Other | Attending: Internal Medicine | Admitting: Internal Medicine

## 2021-06-17 ENCOUNTER — Other Ambulatory Visit: Payer: Self-pay

## 2021-06-17 DIAGNOSIS — R531 Weakness: Secondary | ICD-10-CM | POA: Diagnosis present

## 2021-06-17 DIAGNOSIS — F32A Depression, unspecified: Secondary | ICD-10-CM | POA: Diagnosis present

## 2021-06-17 DIAGNOSIS — E1010 Type 1 diabetes mellitus with ketoacidosis without coma: Secondary | ICD-10-CM | POA: Diagnosis present

## 2021-06-17 DIAGNOSIS — Z8249 Family history of ischemic heart disease and other diseases of the circulatory system: Secondary | ICD-10-CM

## 2021-06-17 DIAGNOSIS — G894 Chronic pain syndrome: Secondary | ICD-10-CM | POA: Diagnosis present

## 2021-06-17 DIAGNOSIS — Z791 Long term (current) use of non-steroidal anti-inflammatories (NSAID): Secondary | ICD-10-CM | POA: Diagnosis not present

## 2021-06-17 DIAGNOSIS — Z833 Family history of diabetes mellitus: Secondary | ICD-10-CM | POA: Diagnosis not present

## 2021-06-17 DIAGNOSIS — A419 Sepsis, unspecified organism: Secondary | ICD-10-CM | POA: Diagnosis present

## 2021-06-17 DIAGNOSIS — I10 Essential (primary) hypertension: Secondary | ICD-10-CM | POA: Diagnosis present

## 2021-06-17 DIAGNOSIS — Z794 Long term (current) use of insulin: Secondary | ICD-10-CM

## 2021-06-17 DIAGNOSIS — B9789 Other viral agents as the cause of diseases classified elsewhere: Secondary | ICD-10-CM | POA: Diagnosis present

## 2021-06-17 DIAGNOSIS — E785 Hyperlipidemia, unspecified: Secondary | ICD-10-CM | POA: Diagnosis present

## 2021-06-17 DIAGNOSIS — H409 Unspecified glaucoma: Secondary | ICD-10-CM | POA: Diagnosis present

## 2021-06-17 DIAGNOSIS — B348 Other viral infections of unspecified site: Secondary | ICD-10-CM | POA: Diagnosis not present

## 2021-06-17 DIAGNOSIS — Z818 Family history of other mental and behavioral disorders: Secondary | ICD-10-CM | POA: Diagnosis not present

## 2021-06-17 DIAGNOSIS — E2740 Unspecified adrenocortical insufficiency: Secondary | ICD-10-CM | POA: Diagnosis present

## 2021-06-17 DIAGNOSIS — Z66 Do not resuscitate: Secondary | ICD-10-CM | POA: Diagnosis present

## 2021-06-17 DIAGNOSIS — Z20822 Contact with and (suspected) exposure to covid-19: Secondary | ICD-10-CM | POA: Diagnosis present

## 2021-06-17 DIAGNOSIS — Z888 Allergy status to other drugs, medicaments and biological substances status: Secondary | ICD-10-CM

## 2021-06-17 DIAGNOSIS — Z87891 Personal history of nicotine dependence: Secondary | ICD-10-CM

## 2021-06-17 DIAGNOSIS — Z91199 Patient's noncompliance with other medical treatment and regimen due to unspecified reason: Secondary | ICD-10-CM | POA: Diagnosis not present

## 2021-06-17 DIAGNOSIS — E1110 Type 2 diabetes mellitus with ketoacidosis without coma: Secondary | ICD-10-CM | POA: Diagnosis not present

## 2021-06-17 DIAGNOSIS — Z79899 Other long term (current) drug therapy: Secondary | ICD-10-CM | POA: Diagnosis not present

## 2021-06-17 DIAGNOSIS — Z9114 Patient's other noncompliance with medication regimen: Secondary | ICD-10-CM | POA: Diagnosis not present

## 2021-06-17 DIAGNOSIS — J189 Pneumonia, unspecified organism: Secondary | ICD-10-CM | POA: Diagnosis present

## 2021-06-17 DIAGNOSIS — E1042 Type 1 diabetes mellitus with diabetic polyneuropathy: Secondary | ICD-10-CM | POA: Diagnosis present

## 2021-06-17 DIAGNOSIS — E876 Hypokalemia: Secondary | ICD-10-CM | POA: Diagnosis present

## 2021-06-17 DIAGNOSIS — K219 Gastro-esophageal reflux disease without esophagitis: Secondary | ICD-10-CM | POA: Diagnosis present

## 2021-06-17 DIAGNOSIS — E1143 Type 2 diabetes mellitus with diabetic autonomic (poly)neuropathy: Secondary | ICD-10-CM

## 2021-06-17 DIAGNOSIS — R5381 Other malaise: Secondary | ICD-10-CM | POA: Diagnosis not present

## 2021-06-17 LAB — BASIC METABOLIC PANEL
Anion gap: 16 — ABNORMAL HIGH (ref 5–15)
Anion gap: 16 — ABNORMAL HIGH (ref 5–15)
Anion gap: 10 (ref 5–15)
Anion gap: 5 (ref 5–15)
Anion gap: 6 (ref 5–15)
BUN: 11 mg/dL (ref 8–23)
BUN: 10 mg/dL (ref 8–23)
BUN: 8 mg/dL (ref 8–23)
BUN: 8 mg/dL (ref 8–23)
BUN: 8 mg/dL (ref 8–23)
CO2: 16 mmol/L — ABNORMAL LOW (ref 22–32)
CO2: 17 mmol/L — ABNORMAL LOW (ref 22–32)
CO2: 19 mmol/L — ABNORMAL LOW (ref 22–32)
CO2: 23 mmol/L (ref 22–32)
CO2: 23 mmol/L (ref 22–32)
Calcium: 9 mg/dL (ref 8.9–10.3)
Calcium: 9.3 mg/dL (ref 8.9–10.3)
Calcium: 8.7 mg/dL — ABNORMAL LOW (ref 8.9–10.3)
Calcium: 8.9 mg/dL (ref 8.9–10.3)
Calcium: 9.2 mg/dL (ref 8.9–10.3)
Chloride: 99 mmol/L (ref 98–111)
Chloride: 101 mmol/L (ref 98–111)
Chloride: 105 mmol/L (ref 98–111)
Chloride: 107 mmol/L (ref 98–111)
Chloride: 104 mmol/L (ref 98–111)
Creatinine, Ser: 0.64 mg/dL (ref 0.44–1.00)
Creatinine, Ser: 0.76 mg/dL (ref 0.44–1.00)
Creatinine, Ser: 0.56 mg/dL (ref 0.44–1.00)
Creatinine, Ser: 0.49 mg/dL (ref 0.44–1.00)
Creatinine, Ser: 0.39 mg/dL — ABNORMAL LOW (ref 0.44–1.00)
GFR, Estimated: >60 mL/min (ref >60)
GFR, Estimated: >60 mL/min (ref >60)
GFR, Estimated: >60 mL/min (ref >60)
GFR, Estimated: >60 mL/min (ref >60)
GFR, Estimated: >60 mL/min (ref >60)
Glucose, Bld: 359 mg/dL — ABNORMAL HIGH (ref 70–99)
Glucose, Bld: 304 mg/dL — ABNORMAL HIGH (ref 70–99)
Glucose, Bld: 205 mg/dL — ABNORMAL HIGH (ref 70–99)
Glucose, Bld: 188 mg/dL — ABNORMAL HIGH (ref 70–99)
Glucose, Bld: 152 mg/dL — ABNORMAL HIGH (ref 70–99)
Potassium: 4.1 mmol/L (ref 3.5–5.1)
Potassium: 4.1 mmol/L (ref 3.5–5.1)
Potassium: 3.9 mmol/L (ref 3.5–5.1)
Potassium: 3.7 mmol/L (ref 3.5–5.1)
Potassium: 3.4 mmol/L — ABNORMAL LOW (ref 3.5–5.1)
Sodium: 131 mmol/L — ABNORMAL LOW (ref 135–145)
Sodium: 134 mmol/L — ABNORMAL LOW (ref 135–145)
Sodium: 134 mmol/L — ABNORMAL LOW (ref 135–145)
Sodium: 135 mmol/L (ref 135–145)
Sodium: 133 mmol/L — ABNORMAL LOW (ref 135–145)

## 2021-06-17 LAB — BETA-HYDROXYBUTYRIC ACID
Beta-Hydroxybutyric Acid: 6.44 mmol/L — ABNORMAL HIGH (ref 0.05–0.27)
Beta-Hydroxybutyric Acid: 0.48 mmol/L — ABNORMAL HIGH (ref 0.05–0.27)
Beta-Hydroxybutyric Acid: 0.14 mmol/L (ref 0.05–0.27)

## 2021-06-17 LAB — CBC
HCT: 37.9 % (ref 36.0–46.0)
Hemoglobin: 12.7 g/dL (ref 12.0–15.0)
MCH: 31.8 pg (ref 26.0–34.0)
MCHC: 33.5 g/dL (ref 30.0–36.0)
MCV: 95 fL (ref 80.0–100.0)
Platelets: 251 10*3/uL (ref 150–400)
RBC: 3.99 MIL/uL (ref 3.87–5.11)
RDW: 12 % (ref 11.5–15.5)
WBC: 11.1 10*3/uL — ABNORMAL HIGH (ref 4.0–10.5)
nRBC: 0 % (ref 0.0–0.2)

## 2021-06-17 LAB — GLUCOSE, CAPILLARY
Glucose-Capillary: 112 mg/dL — ABNORMAL HIGH (ref 70–99)
Glucose-Capillary: 151 mg/dL — ABNORMAL HIGH (ref 70–99)
Glucose-Capillary: 155 mg/dL — ABNORMAL HIGH (ref 70–99)
Glucose-Capillary: 166 mg/dL — ABNORMAL HIGH (ref 70–99)
Glucose-Capillary: 167 mg/dL — ABNORMAL HIGH (ref 70–99)
Glucose-Capillary: 224 mg/dL — ABNORMAL HIGH (ref 70–99)

## 2021-06-17 LAB — URINALYSIS, ROUTINE W REFLEX MICROSCOPIC
Bacteria, UA: NONE SEEN
Bilirubin Urine: NEGATIVE
Glucose, UA: >=500 mg/dL — AB
Hgb urine dipstick: NEGATIVE
Ketones, ur: 80 mg/dL — AB
Leukocytes,Ua: NEGATIVE
Nitrite: NEGATIVE
Protein, ur: NEGATIVE mg/dL
Specific Gravity, Urine: 1.036 — ABNORMAL HIGH (ref 1.005–1.030)
pH: 5 (ref 5.0–8.0)

## 2021-06-17 LAB — RESPIRATORY PANEL BY PCR

## 2021-06-17 LAB — BLOOD GAS, VENOUS
Acid-base deficit: 9.6 mmol/L — ABNORMAL HIGH (ref 0.0–2.0)
Bicarbonate: 16.8 mmol/L — ABNORMAL LOW (ref 20.0–28.0)
O2 Saturation: 62.3 %
Patient temperature: 98.6
pCO2, Ven: 39.6 mmHg — ABNORMAL LOW (ref 44.0–60.0)
pH, Ven: 7.25 (ref 7.250–7.430)
pO2, Ven: 34.9 mmHg (ref 32.0–45.0)

## 2021-06-17 LAB — CBG MONITORING, ED
Glucose-Capillary: 336 mg/dL — ABNORMAL HIGH (ref 70–99)
Glucose-Capillary: 298 mg/dL — ABNORMAL HIGH (ref 70–99)
Glucose-Capillary: 263 mg/dL — ABNORMAL HIGH (ref 70–99)
Glucose-Capillary: 196 mg/dL — ABNORMAL HIGH (ref 70–99)
Glucose-Capillary: 193 mg/dL — ABNORMAL HIGH (ref 70–99)
Glucose-Capillary: 197 mg/dL — ABNORMAL HIGH (ref 70–99)
Glucose-Capillary: 196 mg/dL — ABNORMAL HIGH (ref 70–99)
Glucose-Capillary: 168 mg/dL — ABNORMAL HIGH (ref 70–99)
Glucose-Capillary: 174 mg/dL — ABNORMAL HIGH (ref 70–99)

## 2021-06-17 LAB — RESP PANEL BY RT-PCR (FLU A&B, COVID) ARPGX2
Influenza A by PCR: NEGATIVE
Influenza B by PCR: NEGATIVE
SARS Coronavirus 2 by RT PCR: NEGATIVE

## 2021-06-17 LAB — TSH: TSH: 1.369 u[IU]/mL (ref 0.350–4.500)

## 2021-06-17 LAB — MRSA NEXT GEN BY PCR, NASAL: MRSA by PCR Next Gen: NOT DETECTED

## 2021-06-17 LAB — PROCALCITONIN: Procalcitonin: 0.3 ng/mL

## 2021-06-17 LAB — CK: Total CK: 16 U/L — ABNORMAL LOW (ref 38–234)

## 2021-06-17 MED ORDER — CHLORHEXIDINE GLUCONATE CLOTH 2 % EX PADS
6.0000 | MEDICATED_PAD | Freq: Every day | CUTANEOUS | Status: DC
  Administered 2021-06-17 – 2021-06-19 (×3): 6 via TOPICAL

## 2021-06-17 MED ORDER — MORPHINE SULFATE (PF) 2 MG/ML IV SOLN: 2.0000 mg | INTRAVENOUS | Status: DC | PRN

## 2021-06-17 MED ORDER — ENOXAPARIN SODIUM 40 MG/0.4ML IJ SOSY
40.0000 mg | PREFILLED_SYRINGE | INTRAMUSCULAR | Status: DC
  Administered 2021-06-17 – 2021-06-18 (×2): 40 mg via SUBCUTANEOUS
  Filled 2021-06-17: qty 0.4

## 2021-06-17 MED ORDER — POTASSIUM CHLORIDE 10 MEQ/100ML IV SOLN
10.0000 meq | INTRAVENOUS | Status: AC
  Administered 2021-06-17 (×2): 10 meq via INTRAVENOUS

## 2021-06-17 MED ORDER — SODIUM CHLORIDE 0.9% FLUSH: 3.0000 mL | INTRAVENOUS | Status: DC | PRN

## 2021-06-17 MED ORDER — AZITHROMYCIN 250 MG PO TABS
500.0000 mg | ORAL_TABLET | Freq: Once | ORAL | Status: AC
  Administered 2021-06-17: 500 mg via ORAL

## 2021-06-17 MED ORDER — INSULIN GLARGINE-YFGN 100 UNIT/ML ~~LOC~~ SOLN
10.0000 [IU] | Freq: Once | SUBCUTANEOUS | Status: AC
  Administered 2021-06-17: 10 [IU] via SUBCUTANEOUS
  Filled 2021-06-17: qty 0.1

## 2021-06-17 MED ORDER — LACTATED RINGERS IV SOLN: INTRAVENOUS | Status: DC

## 2021-06-17 MED ORDER — LORATADINE 10 MG PO TABS: 10.0000 mg | ORAL_TABLET | Freq: Every day | ORAL | Status: DC | PRN

## 2021-06-17 MED ORDER — DEXTROSE IN LACTATED RINGERS 5 % IV SOLN
INTRAVENOUS | Status: AC
  Administered 2021-06-17: 125 mL/h via INTRAVENOUS

## 2021-06-17 MED ORDER — INSULIN REGULAR(HUMAN) IN NACL 100-0.9 UT/100ML-% IV SOLN
INTRAVENOUS | Status: DC
  Administered 2021-06-17: 6 [IU]/h via INTRAVENOUS
  Administered 2021-06-17: 15 [IU]/h via INTRAVENOUS

## 2021-06-17 MED ORDER — DEXTROSE IN LACTATED RINGERS 5 % IV SOLN: INTRAVENOUS | Status: DC

## 2021-06-17 MED ORDER — SODIUM CHLORIDE 0.9 % IV SOLN
1.0000 g | Freq: Once | INTRAVENOUS | Status: AC
  Administered 2021-06-17: 1 g via INTRAVENOUS

## 2021-06-17 MED ORDER — SALINE SPRAY 0.65 % NA SOLN: 1.0000 | NASAL | Status: DC | PRN

## 2021-06-17 MED ORDER — LACTATED RINGERS IV BOLUS
20.0000 mL/kg | Freq: Once | INTRAVENOUS | Status: AC
  Administered 2021-06-17: 1134 mL via INTRAVENOUS

## 2021-06-17 MED ORDER — MORPHINE SULFATE (PF) 2 MG/ML IV SOLN
2.0000 mg | INTRAVENOUS | Status: DC | PRN
  Administered 2021-06-17 – 2021-06-18 (×2): 2 mg via INTRAVENOUS
  Filled 2021-06-17: qty 1

## 2021-06-17 MED ORDER — SODIUM CHLORIDE 0.9 % IV SOLN
2.0000 g | INTRAVENOUS | Status: DC
  Administered 2021-06-18 – 2021-06-19 (×2): 2 g via INTRAVENOUS
  Filled 2021-06-17 (×2): qty 20

## 2021-06-17 MED ORDER — SODIUM CHLORIDE 0.9 % IV SOLN
500.0000 mg | INTRAVENOUS | Status: DC
  Administered 2021-06-18: 500 mg via INTRAVENOUS
  Filled 2021-06-17: qty 500

## 2021-06-17 MED ORDER — ORAL CARE MOUTH RINSE
15.0000 mL | Freq: Two times a day (BID) | OROMUCOSAL | Status: DC
  Administered 2021-06-17 – 2021-06-19 (×4): 15 mL via OROMUCOSAL

## 2021-06-17 MED ORDER — INSULIN ASPART 100 UNIT/ML IJ SOLN
0.0000 [IU] | Freq: Every day | INTRAMUSCULAR | Status: DC
  Administered 2021-06-17 – 2021-06-18 (×2): 2 [IU] via SUBCUTANEOUS

## 2021-06-17 MED ORDER — SODIUM CHLORIDE 0.9 % IV SOLN: 250.0000 mL | INTRAVENOUS | Status: DC | PRN

## 2021-06-17 MED ORDER — ACETAMINOPHEN 325 MG PO TABS
650.0000 mg | ORAL_TABLET | Freq: Four times a day (QID) | ORAL | Status: DC | PRN
  Administered 2021-06-17 – 2021-06-19 (×2): 650 mg via ORAL
  Filled 2021-06-17: qty 2

## 2021-06-17 MED ORDER — HYDROCORTISONE SOD SUC (PF) 100 MG IJ SOLR
100.0000 mg | Freq: Once | INTRAMUSCULAR | Status: AC
  Administered 2021-06-17: 100 mg via INTRAVENOUS

## 2021-06-17 MED ORDER — PHENOL 1.4 % MT LIQD
1.0000 | OROMUCOSAL | Status: DC | PRN
  Filled 2021-06-17: qty 177

## 2021-06-17 MED ORDER — DEXTROSE 50 % IV SOLN: 0.0000 mL | INTRAVENOUS | Status: DC | PRN

## 2021-06-17 MED ORDER — ACETAMINOPHEN 650 MG RE SUPP: 650.0000 mg | Freq: Four times a day (QID) | RECTAL | Status: DC | PRN

## 2021-06-17 MED ORDER — MUSCLE RUB 10-15 % EX CREA: 1.0000 "application " | TOPICAL_CREAM | CUTANEOUS | Status: DC | PRN

## 2021-06-17 MED ORDER — SODIUM CHLORIDE 0.9% FLUSH
3.0000 mL | Freq: Two times a day (BID) | INTRAVENOUS | Status: DC
  Administered 2021-06-17 – 2021-06-19 (×4): 3 mL via INTRAVENOUS

## 2021-06-17 MED ORDER — GUAIFENESIN-DM 100-10 MG/5ML PO SYRP
5.0000 mL | ORAL_SOLUTION | ORAL | Status: DC | PRN
  Administered 2021-06-17: 5 mL via ORAL

## 2021-06-17 MED ORDER — ONDANSETRON HCL 4 MG/2ML IJ SOLN: 4.0000 mg | Freq: Three times a day (TID) | INTRAMUSCULAR | Status: DC | PRN

## 2021-06-17 MED ORDER — INSULIN ASPART 100 UNIT/ML IJ SOLN
0.0000 [IU] | Freq: Three times a day (TID) | INTRAMUSCULAR | Status: DC
  Administered 2021-06-18 (×2): 3 [IU] via SUBCUTANEOUS
  Administered 2021-06-18: 8 [IU] via SUBCUTANEOUS
  Administered 2021-06-19: 15 [IU] via SUBCUTANEOUS
  Administered 2021-06-19: 8 [IU] via SUBCUTANEOUS

## 2021-06-17 NOTE — H&P (Signed)
History and Physical    [name] [name]:[id number] DOB: 1960/11/24 DOA: 06/17/2021  PCP: Charlott Rakes, MD  Patient coming from: Home  Chief Complaint: weakness, cough  HPI: [name] [name] is a 60 y.o. female with medical history significant of adrenal insufficiency, DM2, GERD, depression. Presenting with cough and fatigue. She states her symptoms started 5 days ago. She first had cough and congestions. She tried some DayQuil, but it didn't help. Her symptoms progressed to include decreased appetite, fatigue and aches. She was trying to get to bed last night and she felt her legs give way. She fell to the grown. There was no LOC or head injury. She felt weak all over and called for EMS to bring her to the ED.   ED Course: CXR showed RLL PNA. Her glucose and gap were elevated. She was started on abx and DKA protocol. TRH was called for admission.   Review of Systems:  Review of systems is otherwise negative for all not mentioned in HPI.   PMHx Past Medical History:  Diagnosis Date   Cervical disc disorder with radiculopathy of cervical region    Chronic pain syndrome    Degenerative joint disease    Depression dx 1997   Diabetes mellitus, type 2 ([facility]) 2011   No insulin   Diabetic polyneuropathy ([facility])    GERD (gastroesophageal reflux disease)    Glaucoma    Headache(784.0)    Hypertension    Lab 11/2011:  CXR, EKG, CBC, TSH, BMet, troponin-normal; lipid profile: 188, 131, 36, 126    Lumbar herniated disc    Non-compliance    Pancreatic cyst    Endoscopic aspiration in 09/2009   Pneumonia 08/02/2012   Shingles    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 She has never used smokeless tobacco. She reports that she does not drink alcohol and does not use drugs.  Allergies  Allergen Reactions   Tramadol Nausea And Vomiting    REACTION: Projectile vomiting    FamHx Family History  Problem Relation Age of Onset   Depression Mother        type 1   Diabetes Mother    Renal Disease Mother    Lung cancer Father    Heart attack Brother        Died age 52 - was told due to massive MI/heart "exploded"   Heart failure Maternal Grandmother        Details not totally clear    Prior to Admission medications   Medication Sig Start Date End Date Taking? Authorizing Provider  acetaminophen (TYLENOL) 500 MG tablet Take 2 tablets (1,000 mg total) by mouth every 6 (six) hours as needed for mild pain or headache (pain). 07/02/19   Domenic Polite, MD  atorvastatin (LIPITOR) 20 MG tablet TAKE 1 TABLET (20 MG TOTAL) BY MOUTH DAILY. Patient not taking: Reported on 11/25/2020 06/02/20 06/02/21  Gildardo Pounds, NP  Blood Glucose Monitoring Suppl (TRUE METRIX METER) w/Device KIT Use as instructed to check blood  sugar once daily. 04/26/20   Fulp, Cammie, MD  Cholecalciferol (VITAMIN D-3) 125 MCG (5000 UT) TABS Take 1 tablet by mouth daily. 01/24/20   Hilts, Legrand Como, MD  diclofenac Sodium (VOLTAREN) 1 % GEL Apply 1 application topically 4 (four) times daily. 01/16/20   Camillia Herter, NP  DULoxetine (CYMBALTA) 60 MG capsule Take 1 capsule (60 mg total) by mouth 2 (two) times daily. 10/14/20   Charlott Rakes, MD  DULoxetine (CYMBALTA) 60 MG capsule TAKE 1 CAPSULE (60 MG TOTAL) BY MOUTH 2 (TWO) TIMES DAILY. 04/23/20 04/23/21  Patrecia Pour, NP  DULoxetine (CYMBALTA) 60 MG capsule TAKE 1 CAPSULE (60 MG TOTAL) BY MOUTH 2 (TWO) TIMES DAILY. 03/13/21 03/13/22  Charlott Rakes, MD  feeding supplement, GLUCERNA SHAKE, (GLUCERNA SHAKE) LIQD TAKE 237 MLS BY MOUTH 2 (TWO) TIMES DAILY BETWEEN MEALS.  08/12/20 08/12/21  Ghimire, Henreitta Leber, MD  gabapentin (NEURONTIN) 600 MG tablet TAKE ONE TABLET (600 MG) BY MOUTH EVERY MORNING AND AFTERNOON, TAKE TWO TABLETS AT NIGHT 10/14/20   Charlott Rakes, MD  gabapentin (NEURONTIN) 600 MG tablet TAKE ONE TABLET (600 MG) BY MOUTH EVERY MORNING AND AFTERNOON, TAKE TWO TABLETS AT NIGHT 04/23/20 04/23/21  Patrecia Pour, NP  gabapentin (NEURONTIN) 600 MG tablet TAKE ONE TABLET (600 MG) BY MOUTH EVERY MORNING AND AFTERNOON, TAKE TWO TABLETS AT NIGHT 03/13/21 03/13/22  Charlott Rakes, MD  glucose blood (TRUE METRIX BLOOD GLUCOSE TEST) test strip Use as instructed to check blood sugar once daily. 04/26/20   Fulp, Cammie, MD  HUMALOG KWIKPEN 100 UNIT/ML KwikPen Inject 0-12 Units into the skin 3 (three) times daily. Per sliding scale 10/14/20   Charlott Rakes, MD  hydrocortisone (CORTEF) 10 MG tablet Take 1 tablet (10 mg total) by mouth daily at 10 pm. Patient not taking: Reported on 11/25/2020 10/14/20   Charlott Rakes, MD  hydrocortisone (CORTEF) 10 MG tablet TAKE 1 TABLET (10 MG TOTAL) BY MOUTH DAILY AT 10 PM. Patient not taking: Reported on 11/25/2020 08/12/20 08/12/21  Jonetta Osgood, MD  hydrocortisone (CORTEF) 10 MG tablet TAKE 1 TABLET (10 MG TOTAL) BY MOUTH DAILY AT 10 PM. Patient not taking: Reported on 11/25/2020 10/14/20 10/14/21  Charlott Rakes, MD  hydrocortisone (CORTEF) 20 MG tablet TAKE 1 TABLET (20 MG TOTAL) BY MOUTH EVERY MORNING. 08/12/20 08/12/21  Ghimire, Henreitta Leber, MD  Insulin Glargine (BASAGLAR KWIKPEN) 100 UNIT/ML INJECT 50 UNITS INTO THE SKIN AT BEDTIME. 10/14/20 10/14/21  Charlott Rakes, MD  insulin glargine (LANTUS SOLOSTAR) 100 UNIT/ML Solostar Pen Inject 50 Units into the skin at bedtime. 10/14/20   Charlott Rakes, MD  insulin glargine (LANTUS) 100 UNIT/ML Solostar Pen INJECT 26 UNITS INTO THE SKIN DAILY. 08/12/20 08/12/21  Ghimire, Henreitta Leber, MD  insulin lispro (HUMALOG) 100 UNIT/ML KwikPen INJECT 0-12 UNITS INTO THE SKIN 3 (THREE) TIMES DAILY. PER  SLIDING SCALE 10/14/20 10/14/21  Charlott Rakes, MD  Insulin Pen Needle (PEN NEEDLES) 31G X 8 MM MISC Use as directed 08/12/20   Jonetta Osgood, MD  Insulin Pen Needle 31G X 8 MM MISC USE AS DIRECTED 08/12/20 08/12/21  Ghimire, Henreitta Leber, MD  meloxicam (MOBIC) 7.5 MG tablet Take 1 tablet (7.5 mg total) by mouth daily. 11/25/20   Charlott Rakes, MD  midodrine (PROAMATINE) 10 MG tablet Take 1 tablet (10 mg total) by mouth 3 (three) times daily with meals. 11/25/20 11/25/21  Charlott Rakes, MD  Multiple Vitamins-Minerals (CENTRUM SILVER 50+MEN) TABS Take 1 tablet by mouth daily. Patient not taking: Reported on 11/25/2020  [provider]  tiZANidine (ZANAFLEX) 4 MG tablet Take 1 tablet (4 mg total) by mouth every 8 (eight) hours as needed for muscle spasms. 11/25/20   Charlott Rakes, MD    Physical Exam: Vitals:   06/17/21 0900 06/17/21 0930 06/17/21 1030 06/17/21 1100  BP: 124/61 (!) 162/87 135/65 133/70  Pulse: (!) 134 (!) 136 (!) 132 (!) 129  Resp: 19 (!) _0 Temp:      TempSrc:      SpO2: 93% 93% 92% 93%  Weight:      Height:        General: 60 y.o. generally ill appearing female resting in bed Eyes: PERRL, normal sclera ENMT: Nares patent w/o discharge, orophaynx clear, dentition normal, ears w/o discharge/lesions/ulcers Neck: Supple, trachea midline Cardiovascular: tachy, +S1, S2, no m/g/r, equal pulses throughout Respiratory: CTABL, no w/r/r, normal WOB GI: BS+, NDNT, no masses noted, no organomegaly noted MSK: No e/c/c Neuro: A&O x 3, no focal deficits Psyc: Appropriate interaction but flat affect, calm/cooperative  Labs on Admission: I have personally reviewed following labs and imaging studies  CBC: Recent Labs  Lab 06/17/21 0153  WBC 11.1*  HGB 12.7  HCT 37.9  MCV 95.0  PLT 433   Basic Metabolic Panel: Recent Labs  Lab 06/17/21 0153 06/17/21 0835  NA 131* 134*  K 4.1 4.1  CL 99 101  CO2 16* 17*  GLUCOSE 359* 304*  BUN 11 10  CREATININE 0.64  0.76  CALCIUM 9.0 9.3   GFR: Estimated Creatinine Clearance: 66.1 mL/min (by C-G formula based on SCr of 0.76 mg/dL). Liver Function Tests: No results for input(s): AST, ALT, ALKPHOS, BILITOT, PROT, ALBUMIN in the last 168 hours. No results for input(s): LIPASE, AMYLASE in the last 168 hours. No results for input(s): AMMONIA in the last 168 hours. Coagulation Profile: No results for input(s): INR, PROTIME in the last 168 hours. Cardiac Enzymes: Recent Labs  Lab 06/17/21 0153  CKTOTAL 16*   BNP (last 3 results) No results for input(s): PROBNP in the last 8760 hours. HbA1C: No results for input(s): HGBA1C in the last 72 hours. CBG: Recent Labs  Lab 06/17/21 0204 06/17/21 0829 06/17/21 0932 06/17/21 1035 06/17/21 1130  GLUCAP 336* 298* 263* 196* 193*   Lipid Profile: No results for input(s): CHOL, HDL, LDLCALC, TRIG, CHOLHDL, LDLDIRECT in the last 72 hours. Thyroid Function Tests: Recent Labs    06/17/21 0835  TSH 1.369   Anemia Panel: No results for input(s): VITAMINB12, FOLATE, FERRITIN, TIBC, IRON, RETICCTPCT in the last 72 hours. Urine analysis:    Component Value Date/Time   COLORURINE STRAW (A) 06/17/2021 0230   APPEARANCEUR CLEAR 06/17/2021 0230   APPEARANCEUR Clear 03/15/2019 1056   LABSPEC 1.036 (H) 06/17/2021 0230   PHURINE 5.0 06/17/2021 0230   GLUCOSEU >=500 (A) 06/17/2021 0230   HGBUR NEGATIVE 06/17/2021 0230   BILIRUBINUR NEGATIVE 06/17/2021 0230   BILIRUBINUR negative 08/09/2019 1720   BILIRUBINUR Negative 03/15/2019 1056   KETONESUR 80 (A) 06/17/2021 0230   PROTEINUR NEGATIVE 06/17/2021 0230   UROBILINOGEN 0.2 08/09/2019 1720   UROBILINOGEN 0.2 11/12/2013 1755   NITRITE NEGATIVE 06/17/2021 0230   LEUKOCYTESUR NEGATIVE 06/17/2021 0230    Radiological Exams on Admission: CT Cervical Spine Wo Contrast  Result Date: 06/17/2021 CLINICAL DATA:  Neck pain after unwitnessed fall. EXAM: CT CERVICAL SPINE WITHOUT CONTRAST TECHNIQUE: Multidetector  CT imaging of the cervical spine was performed without intravenous contrast. Multiplanar CT image reconstructions were also generated. COMPARISON:  February 18, 2017. FINDINGS: Alignment: Minimal grade 1 anterolisthesis of C4-5 is noted secondary to posterior facet joint hypertrophy. Skull base and vertebrae: No acute fracture. No primary bone lesion or focal pathologic process. Soft tissues and spinal canal: No prevertebral fluid or swelling. No visible canal hematoma. Disc levels: Mild degenerative disc disease is noted at C4-5 and C5-6. Upper chest: No acute abnormality seen. Other: None. IMPRESSION: Mild multilevel degenerative disc disease is noted. No acute abnormality is noted in the cervical spine. Electronically Signed   By: Marijo Conception M.D.   On: 06/17/2021 09:38   DG Chest Portable 1 View  Result Date: 06/17/2021 CLINICAL DATA:  Weakness beginning today EXAM: PORTABLE CHEST 1 VIEW COMPARISON:  07/31/2020 FINDINGS: Heart size is normal. Upper lungs are clear. Mild patchy density at the medial right lung base consistent with right lower lobe pneumonia. No effusion. No bone abnormality. IMPRESSION: Patchy pneumonia in the medial right lower lobe. Electronically Signed   By: Nelson Chimes M.D.   On: 06/17/2021 08:32    EKG: Independently reviewed. Sinus tach, no st elevation  Assessment/Plan Sepsis secondary to RLL PNA     - admitted to inpt, SDU     - continue abx     - COVID/flu negative     - check procal and RVP     - guaifenesin, nebs as needed; wean O2 as able  DKA     - started on endotool; continue     - she ran out of meds several days ago     - TOC for medication assistance     - DM coordinator consult     - may have non-caloric fluids  Chronic adrenal insuffiencey     - she has not taken her meds in months     - not in crisis     - got solu-cortef in ED (151m)     - continue home regimen  Depression     - continue home regimen  HLD     - continue statin  DVT  prophylaxis: lovenox  Code Status: DNR  Family Communication: None at bedside  Consults called: None   Status is: Inpatient  Remains inpatient appropriate because: severity of illness  TJonnie FinnerDO [facility]  If 7PM-7AM, please contact night-coverage [email or web address]  06/17/2021, 11:39 AM

## 2021-06-17 NOTE — ED Provider Notes (Signed)
[facility] DEPT Provider Note   CSN: [id number] Arrival date & time: 06/17/21  0126     History Chief Complaint  Patient presents with   Weakness    [name] [name] is a 60 y.o. female.   Weakness Associated symptoms: no fever    Patient presents to the ED for evaluation of weakness.  Patient states she started having symptoms she thought was the fluid several days ago.  She was having nasal congestion and coughing.  She has not been having any trouble with fever or shortness of breath.  Patient also is a diabetic and ran out of her medications a few days ago.  She has had DKA in the past.  Patient states she has had decreased appetite but has not had any nausea or vomiting.  She started having diffuse body aches.  She also started developing generalized weakness.  She was trying to go to bed last evening when her legs gave out and she fell to the floor.  Patient had to call the ambulance to get her to the hospital.  Patient feels weak all over.  Her arms and legs feel heavy.  Patient denies any injuries in the fall.  She is having some neck pain.  Chronically she has neck pain although maybe a little bit worse now after the fall.  Prior records reviewed and according to the records patient is supposed to be taking hydrocortisone.  Patient states she has not taken that for several months.  The prescription ran out and she could not afford it due to insurance issues  Past Medical History:  Diagnosis Date   Cervical disc disorder with radiculopathy of cervical region    Chronic pain syndrome    Degenerative joint disease    Depression dx 1997   Diabetes mellitus, type 2 ([place]) 2011   No insulin   Diabetic polyneuropathy (HCC)    GERD (gastroesophageal reflux disease)    Glaucoma    Headache(784.0)    Hypertension    Lab 11/2011:  CXR, EKG, CBC, TSH, BMet, troponin-normal; lipid profile: 188, 131, 36, 126    Lumbar herniated disc    Non-compliance     Pancreatic cyst    Endoscopic aspiration in 09/2009   Pneumonia 08/02/2012   Shingles    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   Torn meniscus    Vertigo     Patient Active Problem List   Diagnosis Date Noted   Current severe episode of major depressive disorder without psychotic features ([place]) 09/26/2020   DKA (diabetic ketoacidosis) ([place]) 07/29/2020   Dehydration    Hyperglycemia    Preoperative clearance 02/25/2020   Fracture of medial malleolus, right, closed 02/24/2020   Hyperglycemia due to type 2 diabetes mellitus ([place]) 02/24/2020   Autonomic dysfunction 02/24/2020   Fall 02/24/2020   Adrenal insufficiency ([place]) 09/07/2019   Recurrent syncope 09/06/2019   Sinus tachycardia 09/06/2019   Hypokalemia 09/06/2019   Pulmonary nodules 09/06/2019   Orthostatic hypotension    Syncope and collapse 06/30/2019   Hypotension 06/29/2019   Chronic pain syndrome 08/24/2017   Diabetic polyneuropathy associated with type 2 diabetes mellitus ([place]) 08/24/2017   Restless leg syndrome 08/24/2017   Vertigo 04/09/2016   Acute sinusitis 06/27/2015   Cervical disc disorder with radiculopathy of cervical region 02/20/2015   Neck muscle spasm 10/23/2014   Healthcare maintenance 10/23/2014   Depression 11/25/2013   Diabetes type 2, uncontrolled     Past  Surgical History:  Procedure Laterality Date   ABDOMINAL HYSTERECTOMY     CESAREAN SECTION     ORIF ANKLE FRACTURE Right 02/25/2020   Procedure: OPEN REDUCTION INTERNAL FIXATION (ORIF) ANKLE FRACTURE;  Surgeon: Lovell Sheehan, MD;  Location: [facility] ORS;  Service: Orthopedics;  Laterality: Right;     OB History   No obstetric history on file.     Family History  Problem Relation Age of Onset   Depression Mother        type 1   Diabetes Mother    Renal Disease Mother    Lung cancer Father    Heart attack Brother        Died age 47 - was told due to massive MI/heart "exploded"   Heart failure Maternal Grandmother         Details not totally clear    Social History   Tobacco Use   Smoking status: Former    Types: Cigarettes    Quit date: 08/03/1978    Years since quitting: 42.9   Smokeless tobacco: Never   Tobacco comments:    Smoked age 3-19  Substance Use Topics   Alcohol use: No    Comment: Former   Drug use: No    Home Medications Prior to Admission medications   Medication Sig Start Date End Date Taking? Authorizing Provider  acetaminophen (TYLENOL) 500 MG tablet Take 2 tablets (1,000 mg total) by mouth every 6 (six) hours as needed for mild pain or headache (pain). 07/02/19   Domenic Polite, MD  atorvastatin (LIPITOR) 20 MG tablet TAKE 1 TABLET (20 MG TOTAL) BY MOUTH DAILY. Patient not taking: Reported on 11/25/2020 06/02/20 06/02/21  Gildardo Pounds, NP  Blood Glucose Monitoring Suppl (TRUE METRIX METER) w/Device KIT Use as instructed to check blood sugar once daily. 04/26/20   Fulp, Cammie, MD  Cholecalciferol (VITAMIN D-3) 125 MCG (5000 UT) TABS Take 1 tablet by mouth daily. 01/24/20   Hilts, Legrand Como, MD  diclofenac Sodium (VOLTAREN) 1 % GEL Apply 1 application topically 4 (four) times daily. 01/16/20   Camillia Herter, NP  DULoxetine (CYMBALTA) 60 MG capsule Take 1 capsule (60 mg total) by mouth 2 (two) times daily. 10/14/20   Charlott Rakes, MD  DULoxetine (CYMBALTA) 60 MG capsule TAKE 1 CAPSULE (60 MG TOTAL) BY MOUTH 2 (TWO) TIMES DAILY. 04/23/20 04/23/21  Patrecia Pour, NP  DULoxetine (CYMBALTA) 60 MG capsule TAKE 1 CAPSULE (60 MG TOTAL) BY MOUTH 2 (TWO) TIMES DAILY. 03/13/21 03/13/22  Charlott Rakes, MD  feeding supplement, GLUCERNA SHAKE, (GLUCERNA SHAKE) LIQD TAKE 237 MLS BY MOUTH 2 (TWO) TIMES DAILY BETWEEN MEALS. 08/12/20 08/12/21  Ghimire, Henreitta Leber, MD  gabapentin (NEURONTIN) 600 MG tablet TAKE ONE TABLET (600 MG) BY MOUTH EVERY MORNING AND AFTERNOON, TAKE TWO TABLETS AT NIGHT 10/14/20   Charlott Rakes, MD  gabapentin (NEURONTIN) 600 MG tablet TAKE ONE TABLET (600 MG) BY MOUTH EVERY  MORNING AND AFTERNOON, TAKE TWO TABLETS AT NIGHT 04/23/20 04/23/21  Patrecia Pour, NP  gabapentin (NEURONTIN) 600 MG tablet TAKE ONE TABLET (600 MG) BY MOUTH EVERY MORNING AND AFTERNOON, TAKE TWO TABLETS AT NIGHT 03/13/21 03/13/22  Charlott Rakes, MD  glucose blood (TRUE METRIX BLOOD GLUCOSE TEST) test strip Use as instructed to check blood sugar once daily. 04/26/20   Fulp, Cammie, MD  HUMALOG KWIKPEN 100 UNIT/ML KwikPen Inject 0-12 Units into the skin 3 (three) times daily. Per sliding scale 10/14/20   Charlott Rakes, MD  hydrocortisone (CORTEF) 10  MG tablet Take 1 tablet (10 mg total) by mouth daily at 10 pm. Patient not taking: Reported on 11/25/2020 10/14/20   Charlott Rakes, MD  hydrocortisone (CORTEF) 10 MG tablet TAKE 1 TABLET (10 MG TOTAL) BY MOUTH DAILY AT 10 PM. Patient not taking: Reported on 11/25/2020 08/12/20 08/12/21  Jonetta Osgood, MD  hydrocortisone (CORTEF) 10 MG tablet TAKE 1 TABLET (10 MG TOTAL) BY MOUTH DAILY AT 10 PM. Patient not taking: Reported on 11/25/2020 10/14/20 10/14/21  Charlott Rakes, MD  hydrocortisone (CORTEF) 20 MG tablet TAKE 1 TABLET (20 MG TOTAL) BY MOUTH EVERY MORNING. 08/12/20 08/12/21  Ghimire, Henreitta Leber, MD  Insulin Glargine (BASAGLAR KWIKPEN) 100 UNIT/ML INJECT 50 UNITS INTO THE SKIN AT BEDTIME. 10/14/20 10/14/21  Charlott Rakes, MD  insulin glargine (LANTUS SOLOSTAR) 100 UNIT/ML Solostar Pen Inject 50 Units into the skin at bedtime. 10/14/20   Charlott Rakes, MD  insulin glargine (LANTUS) 100 UNIT/ML Solostar Pen INJECT 26 UNITS INTO THE SKIN DAILY. 08/12/20 08/12/21  Ghimire, Henreitta Leber, MD  insulin lispro (HUMALOG) 100 UNIT/ML KwikPen INJECT 0-12 UNITS INTO THE SKIN 3 (THREE) TIMES DAILY. PER SLIDING SCALE 10/14/20 10/14/21  Charlott Rakes, MD  Insulin Pen Needle (PEN NEEDLES) 31G X 8 MM MISC Use as directed 08/12/20   Jonetta Osgood, MD  Insulin Pen Needle 31G X 8 MM MISC USE AS DIRECTED 08/12/20 08/12/21  Ghimire, Henreitta Leber, MD  meloxicam (MOBIC) 7.5 MG tablet  Take 1 tablet (7.5 mg total) by mouth daily. 11/25/20   Charlott Rakes, MD  midodrine (PROAMATINE) 10 MG tablet Take 1 tablet (10 mg total) by mouth 3 (three) times daily with meals. 11/25/20 11/25/21  Charlott Rakes, MD  Multiple Vitamins-Minerals (CENTRUM SILVER 50+MEN) TABS Take 1 tablet by mouth daily. Patient not taking: Reported on 11/25/2020    [provider]  tiZANidine (ZANAFLEX) 4 MG tablet Take 1 tablet (4 mg total) by mouth every 8 (eight) hours as needed for muscle spasms. 11/25/20   Charlott Rakes, MD    Allergies    Tramadol  Review of Systems   Review of Systems  Constitutional:  Negative for fever.  Neurological:  Positive for weakness.  All other systems reviewed and are negative.  Physical Exam Updated Vital Signs BP 124/61   Pulse (!) 134   Temp 99 F (37.2 C) (Oral)   Resp 19   Ht 1.651 m (5' 5" )   Wt 56.7 kg   SpO2 93%   BMI 20.80 kg/m   Physical Exam Vitals and nursing note reviewed.  Constitutional:      General: She is not in acute distress.    Appearance: She is well-developed.  HENT:     Head: Normocephalic and atraumatic.     Right Ear: External ear normal.     Left Ear: External ear normal.  Eyes:     General: No scleral icterus.       Right eye: No discharge.        Left eye: No discharge.     Conjunctiva/sclera: Conjunctivae normal.  Neck:     Trachea: No tracheal deviation.  Cardiovascular:     Rate and Rhythm: Regular rhythm. Tachycardia present.  Pulmonary:     Effort: Pulmonary effort is normal. No respiratory distress.     Breath sounds: Normal breath sounds. No stridor. No wheezing or rales.  Abdominal:     General: Bowel sounds are normal. There is no distension.     Palpations: Abdomen is soft.  Tenderness: There is no abdominal tenderness. There is no guarding or rebound.  Musculoskeletal:        General: No tenderness or deformity.     Cervical back: Neck supple. Bony tenderness present.     Thoracic back: Normal.  No tenderness.     Lumbar back: Normal. No tenderness.  Skin:    General: Skin is warm and dry.     Findings: No rash.  Neurological:     General: No focal deficit present.     Mental Status: She is alert.     Cranial Nerves: No cranial nerve deficit (no facial droop, extraocular movements intact, no slurred speech).     Sensory: No sensory deficit.     Motor: Weakness present. No abnormal muscle tone or seizure activity.     Coordination: Coordination normal.     Comments: Generalized weakness, difficulty lifting arms or legs off the bed  Psychiatric:        Mood and Affect: Mood normal.    ED Results / Procedures / Treatments   Labs (all labs ordered are listed, but only abnormal results are displayed) Labs Reviewed  BASIC METABOLIC PANEL - Abnormal; Notable for the following components:      Result Value   Sodium 131 (*)    CO2 16 (*)    Glucose, Bld 359 (*)    Anion gap 16 (*)    All other components within normal limits  CBC - Abnormal; Notable for the following components:   WBC 11.1 (*)    All other components within normal limits  URINALYSIS, ROUTINE W REFLEX MICROSCOPIC - Abnormal; Notable for the following components:   Color, Urine STRAW (*)    Specific Gravity, Urine 1.036 (*)    Glucose, UA >=500 (*)    Ketones, ur 80 (*)    All other components within normal limits  CK - Abnormal; Notable for the following components:   Total CK 16 (*)    All other components within normal limits  BASIC METABOLIC PANEL - Abnormal; Notable for the following components:   Sodium 134 (*)    CO2 17 (*)    Glucose, Bld 304 (*)    Anion gap 16 (*)    All other components within normal limits  BLOOD GAS, VENOUS - Abnormal; Notable for the following components:   pCO2, Ven 39.6 (*)    Bicarbonate 16.8 (*)    Acid-base deficit 9.6 (*)    All other components within normal limits  CBG MONITORING, ED - Abnormal; Notable for the following components:   Glucose-Capillary 336 (*)     All other components within normal limits  CBG MONITORING, ED - Abnormal; Notable for the following components:   Glucose-Capillary 298 (*)    All other components within normal limits  RESP PANEL BY RT-PCR (FLU A&B, COVID) ARPGX2  BASIC METABOLIC PANEL  BASIC METABOLIC PANEL  BASIC METABOLIC PANEL  BETA-HYDROXYBUTYRIC ACID  BETA-HYDROXYBUTYRIC ACID  TSH    EKG EKG Interpretation  Date/Time:  Tuesday June 17 2021 01:47:03 EST Ventricular Rate:  126 PR Interval:  132 QRS Duration: 76 QT Interval:  322 QTC Calculation: 466 R Axis:   92 Text Interpretation: Sinus tachycardia Rightward axis Borderline ECG Confirmed by Quintella Reichert [phone]) on 06/17/2021 4:36:52 AM  Radiology DG Chest Portable 1 View  Result Date: 06/17/2021 CLINICAL DATA:  Weakness beginning today EXAM: PORTABLE CHEST 1 VIEW COMPARISON:  07/31/2020 FINDINGS: Heart size is normal. Upper lungs are clear. Mild patchy  density at the medial right lung base consistent with right lower lobe pneumonia. No effusion. No bone abnormality. IMPRESSION: Patchy pneumonia in the medial right lower lobe. Electronically Signed   By: Nelson Chimes M.D.   On: 06/17/2021 08:32    Procedures .Critical Care Performed by: Dorie Rank, MD Authorized by: Dorie Rank, MD   Critical care provider statement:    Critical care time (minutes):  45   Critical care was time spent personally by me on the following activities:  Development of treatment plan with patient or surrogate, discussions with consultants, evaluation of patient's response to treatment, examination of patient, ordering and review of laboratory studies, ordering and review of radiographic studies, ordering and performing treatments and interventions, pulse oximetry, re-evaluation of patient's condition and review of old charts   Medications Ordered in ED Medications  insulin regular, human (MYXREDLIN) 100 units/ 100 mL infusion (15 Units/hr Intravenous New Bag/Given  06/17/21 0900)  lactated ringers infusion (has no administration in time range)  dextrose 5 % in lactated ringers infusion (has no administration in time range)  dextrose 50 % solution 0-50 mL (has no administration in time range)  potassium chloride 10 mEq in 100 mL IVPB (10 mEq Intravenous New Bag/Given 06/17/21 0844)  cefTRIAXone (ROCEPHIN) 1 g in sodium chloride 0.9 % 100 mL IVPB (has no administration in time range)  azithromycin (ZITHROMAX) tablet 500 mg (has no administration in time range)  lactated ringers bolus 1,134 mL (1,134 mLs Intravenous New Bag/Given 06/17/21 0839)  hydrocortisone sodium succinate (SOLU-CORTEF) 100 MG injection 100 mg (100 mg Intravenous Given 06/17/21 0845)    ED Course  I have reviewed the triage vital signs and the nursing notes.  Pertinent labs & imaging results that were available during my care of the patient were reviewed by me and considered in my medical decision making (see chart for details).  Clinical Course as of 06/17/21 0916  Tue Jun 17, 2021  0751 Labs reviewed, urinalysis notable for ketones otherwise no signs of infection.  Influenza and flu negative [JK]  9233 Metabolic panel shows hyperglycemia, increased anion gap metabolic acidosis [JK]  0076 White blood cell count elevated 11 [JK]  0802 COVID and influenza are negative [JK]    Clinical Course User Index [JK] Dorie Rank, MD   MDM Rules/Calculators/A&P                           Patient presented to the ER for evaluation of weakness and myalgias.  Patient thought she was developing fluid.  No fevers.  No vomiting or diarrhea.  Patient noted to be tachycardic in the ED but no hypotension or fever.  No findings to suggest evolving sepsis or acute infection.  Laboratory tests do not show any signs of severe dehydration or new anemia however the patient does have an anion gap metabolic acidosis.  Patient has stopped taking her diabetes medications.  I suspect this is the cause for this.   She does not have any evidence of influenza or COVID.  Chest x-ray however does show evidence of patchy pneumonia.  We will start patient on IV antibiotics.  Have also ordered IV insulin for her anion gap metabolic acidosis presumably related to diabetic ketoacidosis.  We will need to monitor sugar closely.  I will consult the medical service for admission and further treatment Final Clinical Impression(s) / ED Diagnoses Final diagnoses:  Weakness  Diabetic ketoacidosis without coma associated with type 1 diabetes  mellitus [facility])  Community acquired pneumonia, unspecified laterality     Dorie Rank, MD 06/17/21 [phone]

## 2021-06-17 NOTE — ED Triage Notes (Signed)
Patient brought from home by [facility] because she states her legs gave out and dont know how long she been laying there.

## 2021-06-17 NOTE — ED Notes (Signed)
Save blue gold(2) save in main lab

## 2021-06-17 NOTE — Progress Notes (Signed)
Inpatient Diabetes Program Recommendations  AACE/ADA: New Consensus Statement on Inpatient Glycemic Control (2015)  Target Ranges:  Prepandial:   less than 140 mg/dL      Peak postprandial:   less than 180 mg/dL (1-2 hours)      Critically ill patients:  140 - 180 mg/dL   Lab Results  Component Value Date   GLUCAP 196 (H) 06/17/2021   HGBA1C 13.6 (A) 11/25/2020    Review of Glycemic Control  Diabetes history: DM 2 Outpatient Diabetes medications: Lantus 50 units, Novolog 20 units breakfast and dinner, SSI Current orders for Inpatient glycemic control:  IV insulin/Endotool  Inpatient Diabetes Program Recommendations:    Note: IV insulin currently at 5 units/hour  At time of transition consider: -  Semglee 45 units -  Novolog 0-15 units tid + hs  (Pt may benefit from simplified regimen of 70/30 insulin bid, paying for 1 insulin instead of 2)  Spoke with pt at bedside. Pt drowsy during visit. Pt reports not having the money to get her refills for her medication/insulin. Pt goes to [facility], [place] for insulin is $10 (cheaper than [employer]). Pts last visit with [facility] was on 02/25/21 and was a telephone visit. Pt is due for another follow up (Every 3 months per MD note). There is not a cheaper option than this.  Thanks,  Tama Headings RN, MSN, BC-ADM Inpatient Diabetes Coordinator Team Pager [phone] (8a-5p)

## 2021-06-18 DIAGNOSIS — B348 Other viral infections of unspecified site: Secondary | ICD-10-CM

## 2021-06-18 DIAGNOSIS — E2740 Unspecified adrenocortical insufficiency: Secondary | ICD-10-CM

## 2021-06-18 DIAGNOSIS — R5381 Other malaise: Secondary | ICD-10-CM

## 2021-06-18 DIAGNOSIS — J189 Pneumonia, unspecified organism: Secondary | ICD-10-CM

## 2021-06-18 DIAGNOSIS — Z91199 Patient's noncompliance with other medical treatment and regimen due to unspecified reason: Secondary | ICD-10-CM

## 2021-06-18 DIAGNOSIS — E876 Hypokalemia: Secondary | ICD-10-CM

## 2021-06-18 LAB — BASIC METABOLIC PANEL
Anion gap: 7 (ref 5–15)
Anion gap: 8 (ref 5–15)
BUN: 10 mg/dL (ref 8–23)
BUN: 12 mg/dL (ref 8–23)
CO2: 23 mmol/L (ref 22–32)
CO2: 21 mmol/L — ABNORMAL LOW (ref 22–32)
Calcium: 8.7 mg/dL — ABNORMAL LOW (ref 8.9–10.3)
Calcium: 8.4 mg/dL — ABNORMAL LOW (ref 8.9–10.3)
Chloride: 103 mmol/L (ref 98–111)
Chloride: 102 mmol/L (ref 98–111)
Creatinine, Ser: 0.45 mg/dL (ref 0.44–1.00)
Creatinine, Ser: 0.45 mg/dL (ref 0.44–1.00)
GFR, Estimated: >60 mL/min (ref >60)
GFR, Estimated: >60 mL/min (ref >60)
Glucose, Bld: 227 mg/dL — ABNORMAL HIGH (ref 70–99)
Glucose, Bld: 250 mg/dL — ABNORMAL HIGH (ref 70–99)
Potassium: 3.1 mmol/L — ABNORMAL LOW (ref 3.5–5.1)
Potassium: 3.2 mmol/L — ABNORMAL LOW (ref 3.5–5.1)
Sodium: 133 mmol/L — ABNORMAL LOW (ref 135–145)
Sodium: 131 mmol/L — ABNORMAL LOW (ref 135–145)

## 2021-06-18 LAB — GLUCOSE, CAPILLARY
Glucose-Capillary: 261 mg/dL — ABNORMAL HIGH (ref 70–99)
Glucose-Capillary: 184 mg/dL — ABNORMAL HIGH (ref 70–99)
Glucose-Capillary: 175 mg/dL — ABNORMAL HIGH (ref 70–99)
Glucose-Capillary: 223 mg/dL — ABNORMAL HIGH (ref 70–99)

## 2021-06-18 MED ORDER — INSULIN GLARGINE-YFGN 100 UNIT/ML ~~LOC~~ SOLN
45.0000 [IU] | Freq: Once | SUBCUTANEOUS | Status: AC
  Administered 2021-06-18: 45 [IU] via SUBCUTANEOUS
  Filled 2021-06-18: qty 0.45

## 2021-06-18 MED ORDER — POTASSIUM CHLORIDE CRYS ER 20 MEQ PO TBCR
40.0000 meq | EXTENDED_RELEASE_TABLET | Freq: Once | ORAL | Status: AC
  Administered 2021-06-18: 40 meq via ORAL
  Filled 2021-06-18: qty 2

## 2021-06-18 MED ORDER — KETOROLAC TROMETHAMINE 30 MG/ML IJ SOLN
30.0000 mg | Freq: Once | INTRAMUSCULAR | Status: AC
  Administered 2021-06-18: 30 mg via INTRAVENOUS
  Filled 2021-06-18: qty 1

## 2021-06-18 MED ORDER — MIDODRINE HCL 5 MG PO TABS
10.0000 mg | ORAL_TABLET | Freq: Three times a day (TID) | ORAL | Status: DC
  Administered 2021-06-18 – 2021-06-19 (×4): 10 mg via ORAL
  Filled 2021-06-18 (×4): qty 2

## 2021-06-18 MED ORDER — GABAPENTIN 300 MG PO CAPS
600.0000 mg | ORAL_CAPSULE | ORAL | Status: DC
  Administered 2021-06-18 – 2021-06-19 (×2): 600 mg via ORAL
  Filled 2021-06-18 (×2): qty 2

## 2021-06-18 MED ORDER — TIZANIDINE HCL 4 MG PO TABS: 4.0000 mg | ORAL_TABLET | Freq: Three times a day (TID) | ORAL | Status: DC | PRN

## 2021-06-18 MED ORDER — MELATONIN 3 MG PO TABS
3.0000 mg | ORAL_TABLET | Freq: Once | ORAL | Status: AC
  Administered 2021-06-18: 3 mg via ORAL

## 2021-06-18 MED ORDER — AZITHROMYCIN 250 MG PO TABS
500.0000 mg | ORAL_TABLET | Freq: Every day | ORAL | Status: DC
  Administered 2021-06-19: 500 mg via ORAL
  Filled 2021-06-18: qty 2

## 2021-06-18 MED ORDER — GABAPENTIN 400 MG PO CAPS: 1200.0000 mg | ORAL_CAPSULE | Freq: Every day | ORAL | Status: DC

## 2021-06-18 MED ORDER — GABAPENTIN 600 MG PO TABS: 1200.0000 mg | ORAL_TABLET | Freq: Two times a day (BID) | ORAL | Status: DC

## 2021-06-18 MED ORDER — DULOXETINE HCL 30 MG PO CPEP
60.0000 mg | ORAL_CAPSULE | Freq: Two times a day (BID) | ORAL | Status: DC
  Administered 2021-06-18 – 2021-06-19 (×2): 60 mg via ORAL
  Filled 2021-06-18 (×2): qty 2

## 2021-06-18 MED ORDER — GABAPENTIN 300 MG PO CAPS
600.0000 mg | ORAL_CAPSULE | Freq: Once | ORAL | Status: AC
  Administered 2021-06-18: 600 mg via ORAL
  Filled 2021-06-18: qty 2

## 2021-06-18 MED ORDER — SODIUM CHLORIDE 0.9 % IV BOLUS
250.0000 mL | Freq: Once | INTRAVENOUS | Status: AC
  Administered 2021-06-18: 250 mL via INTRAVENOUS

## 2021-06-18 NOTE — Progress Notes (Addendum)
PROGRESS NOTE  [name] [name] XBD:[id number] DOB: [birth year] DOA: 06/17/2021 PCP: Charlott Rakes, MD   LOS: 1 day   Brief narrative:   [name] [name] is a 60 y.o. female with past medical history of adrenal insufficiency, diabetes mellitus type 2, GERD, depression presented to hospital with cough and fatigue for 5 days with nasal congestion.  She tried DayQuil as outpatient but did not improve.  She also had decreased appetite fatigue and generalized body aches.  Patient was then brought into the hospital.  Chest x-ray showed a right lower lobe pneumonia.  She also had elevated blood glucose levels and was off insulin for 1 month now.  She was then admitted hospital for DKA and pneumonia.  Assessment/Plan:  Principal Problem:   DKA (diabetic ketoacidosis) ([place])  Sepsis secondary to RLL pneumonia. Patient had signs of sepsis on presentation including tachycardia tachypnea, leukocytosis with pneumonia.  Patient has tested positive for rhinovirus on respiratory viral panel.  COVID and influenza is negative.  We will continue supportive care.  Procalcitonin negative.  Continue supplemental oxygen.  Continue Rocephin and Zithromax for now.      DKA Improved at this time.  Off insulin for several days now. TOC on board on   Medication assistance.  Diabetic coordinator on board Input and will likely benefit from 70/30 insulin on discharge.  On long-acting and sliding scale insulin at this time.  Continue Ringer lactate for now.  Hypokalemia.  We will replenish orally.  Check levels in a.m.    Chronic adrenal insuffiencey Was not taking her medications as outpatient.  Complains of generalized weakness, fatigue and generalized body ache.  Received Solu-Cortef in ED.  We will continue home regimen.  Chronic body ache spasms.  Likely exacerbated by viral infection.  Patient takes duloxetine, muscle relaxant, gabapentin at home.  Will resume.   Depression Continue duloxetine   Hyperlipidemia.    Hold statins due to muscle aches for now.  Deconditioning, debility.  We will get PT evaluation.      DVT prophylaxis: enoxaparin (LOVENOX) injection 40 mg Start: 06/17/21 2200   Code Status: DNR  Family Communication:  Spoke with patient's daughter on the phone and updated her about the clinical condition of the patient.   Status is: Inpatient  Remains inpatient appropriate because: DKA, pneumonia, IV antibiotics, IV fluids  Consultants: None  Procedures: None  Anti-infectives:  Rocephin and Zithromax IV  Anti-infectives (From admission, onward)    Start     Dose/Rate Route Frequency Ordered Stop   06/18/21 1000  cefTRIAXone (ROCEPHIN) 2 g in sodium chloride 0.9 % 100 mL IVPB        2 g 200 mL/hr over 30 Minutes Intravenous Every 24 hours 06/17/21 1311 06/23/21 0959   06/18/21 1000  azithromycin (ZITHROMAX) 500 mg in sodium chloride 0.9 % 250 mL IVPB        500 mg 250 mL/hr over 60 Minutes Intravenous Every 24 hours 06/17/21 1311 06/23/21 0959   06/17/21 0915  cefTRIAXone (ROCEPHIN) 1 g in sodium chloride 0.9 % 100 mL IVPB        1 g 200 mL/hr over 30 Minutes Intravenous  Once 06/17/21 0914 06/17/21 1023   06/17/21 0915  azithromycin (ZITHROMAX) tablet 500 mg        500 mg Oral  Once 06/17/21 0914 06/17/21 0952      Subjective:  Today, patient was seen and examined at bedside.  Patient complains of generalized body ache, pain and spasms.  Does not feel well.  Denies any nausea, vomiting, fever or chills.  Objective: Vitals:   06/18/21 0900 06/18/21 1000  BP: 99/84 100/60  Pulse: (!) 124 (!) 116  Resp: 15 13  Temp:    SpO2: 94% 96%    Intake/Output Summary (Last 24 hours) at 06/18/2021 1137 Last data filed at 06/18/2021 1128 Gross per 24 hour  Intake 1990.17 ml  Output 1075 ml  Net 915.17 ml   Filed Weights   06/17/21 0457 06/17/21 1618  Weight: 56.7 kg 60.8 kg   Body mass index is 21.63 kg/m.   Physical Exam:  GENERAL: Patient is alert awake  and oriented. Not in obvious distress.  Thinly built HENT: No scleral pallor or icterus. Pupils equally reactive to light. Oral mucosa is dry. NECK: is supple, no gross swelling noted. CHEST: Clear to auscultation. No crackles or wheezes.  Diminished breath sounds bilaterally. CVS: S1 and S2 heard, no murmur. Regular rate and rhythm.  ABDOMEN: Soft, non-tender, bowel sounds are present. EXTREMITIES: No edema.  Non specific tenderness on palpation. CNS: Cranial nerves are intact. No focal motor deficits.  Mildly anxious. SKIN: warm and dry without rashes.  Data Review: I have personally reviewed the following laboratory data and studies,  CBC: Recent Labs  Lab 06/17/21 0153  WBC 11.1*  HGB 12.7  HCT 37.9  MCV 95.0  PLT 409   Basic Metabolic Panel: Recent Labs  Lab 06/17/21 1134 06/17/21 1540 06/17/21 2004 06/17/21 2344 06/18/21 0308  NA 134* 135 133* 133* 131*  K 3.9 3.7 3.4* 3.1* 3.2*  CL 105 107 104 103 102  CO2 19* 23 23 23  21*  GLUCOSE 205* 188* 152* 227* 250*  BUN 8 8 8 10 12   CREATININE 0.56 0.49 0.39* 0.45 0.45  CALCIUM 8.7* 8.9 9.2 8.7* 8.4*   Liver Function Tests: No results for input(s): AST, ALT, ALKPHOS, BILITOT, PROT, ALBUMIN in the last 168 hours. No results for input(s): LIPASE, AMYLASE in the last 168 hours. No results for input(s): AMMONIA in the last 168 hours. Cardiac Enzymes: Recent Labs  Lab 06/17/21 0153  CKTOTAL 16*   BNP (last 3 results) No results for input(s): BNP in the last 8760 hours.  ProBNP (last 3 results) No results for input(s): PROBNP in the last 8760 hours.  CBG: Recent Labs  Lab 06/17/21 2026 06/17/21 2125 06/17/21 2235 06/17/21 2332 06/18/21 0903  GLUCAP 151* 112* 155* 224* 261*   Recent Results (from the past 240 hour(s))  Resp Panel by RT-PCR (Flu A&B, Covid) Nasopharyngeal Swab     Status: None   Collection Time: 06/17/21  2:12 AM   Specimen: Nasopharyngeal Swab; Nasopharyngeal(NP) swabs in vial transport  medium  Result Value Ref Range Status   SARS Coronavirus 2 by RT PCR NEGATIVE NEGATIVE Final    Comment: (NOTE) SARS-CoV-2 target nucleic acids are NOT DETECTED.  The SARS-CoV-2 RNA is generally detectable in upper respiratory specimens during the acute phase of infection. The lowest concentration of SARS-CoV-2 viral copies this assay can detect is 138 copies/mL. A negative result does not preclude SARS-Cov-2 infection and should not be used as the sole basis for treatment or other patient management decisions. A negative result may occur with  improper specimen collection/handling, submission of specimen other than nasopharyngeal swab, presence of viral mutation(s) within the areas targeted by this assay, and inadequate number of viral copies(<138 copies/mL). A negative result must be combined with clinical observations, patient history, and epidemiological information. The expected result is  Negative.  Fact Sheet for Patients:  EntrepreneurPulse.com.au  Fact Sheet for Healthcare Providers:  IncredibleEmployment.be  This test is no t yet approved or cleared by the [place] FDA and  has been authorized for detection and/or diagnosis of SARS-CoV-2 by FDA under an Emergency Use Authorization (EUA). This EUA will remain  in effect (meaning this test can be used) for the duration of the COVID-19 declaration under Section 564(b)(1) of the Act, 21 U.S.C.section 360bbb-3(b)(1), unless the authorization is terminated  or revoked sooner.       Influenza A by PCR NEGATIVE NEGATIVE Final   Influenza B by PCR NEGATIVE NEGATIVE Final    Comment: (NOTE) The Xpert Xpress SARS-CoV-2/FLU/RSV plus assay is intended as an aid in the diagnosis of influenza from Nasopharyngeal swab specimens and should not be used as a sole basis for treatment. Nasal washings and aspirates are unacceptable for Xpert Xpress SARS-CoV-2/FLU/RSV testing.  Fact Sheet for  Patients: EntrepreneurPulse.com.au  Fact Sheet for Healthcare Providers: IncredibleEmployment.be  This test is not yet approved or cleared by the [place] FDA and has been authorized for detection and/or diagnosis of SARS-CoV-2 by FDA under an Emergency Use Authorization (EUA). This EUA will remain in effect (meaning this test can be used) for the duration of the COVID-19 declaration under Section 564(b)(1) of the Act, 21 U.S.C. section 360bbb-3(b)(1), unless the authorization is terminated or revoked.  Performed at [facility], [place] [address] [place], [place] [address]   Respiratory (~20 pathogens) panel by PCR     Status: Abnormal   Collection Time: 06/17/21  1:12 PM   Specimen: Nasopharyngeal Swab; Respiratory  Result Value Ref Range Status   Adenovirus NOT DETECTED NOT DETECTED Final   Coronavirus 229E NOT DETECTED NOT DETECTED Final    Comment: (NOTE) The Coronavirus on the Respiratory Panel, DOES NOT test for the novel  Coronavirus (2019 nCoV)    Coronavirus HKU1 NOT DETECTED NOT DETECTED Final   Coronavirus NL63 NOT DETECTED NOT DETECTED Final   Coronavirus OC43 NOT DETECTED NOT DETECTED Final   Metapneumovirus NOT DETECTED NOT DETECTED Final   Rhinovirus / Enterovirus DETECTED (A) NOT DETECTED Final   Influenza A NOT DETECTED NOT DETECTED Final   Influenza B NOT DETECTED NOT DETECTED Final   Parainfluenza Virus 1 NOT DETECTED NOT DETECTED Final   Parainfluenza Virus 2 NOT DETECTED NOT DETECTED Final   Parainfluenza Virus 3 NOT DETECTED NOT DETECTED Final   Parainfluenza Virus 4 NOT DETECTED NOT DETECTED Final   Respiratory Syncytial Virus NOT DETECTED NOT DETECTED Final   Bordetella pertussis NOT DETECTED NOT DETECTED Final   Bordetella Parapertussis NOT DETECTED NOT DETECTED Final   Chlamydophila pneumoniae NOT DETECTED NOT DETECTED Final   Mycoplasma pneumoniae NOT DETECTED NOT DETECTED Final    Comment:  Performed at [facility], [place]. [address], [place], [place] [address]  MRSA Next Gen by PCR, Nasal     Status: None   Collection Time: 06/17/21  4:18 PM   Specimen: Nasal Mucosa; Nasal Swab  Result Value Ref Range Status   MRSA by PCR Next Gen NOT DETECTED NOT DETECTED Final    Comment: (NOTE) The GeneXpert MRSA Assay (FDA approved for NASAL specimens only), is one component of a comprehensive MRSA colonization surveillance program. It is not intended to diagnose MRSA infection nor to guide or monitor treatment for MRSA infections. Test performance is not FDA approved in patients less than 25 years old. Performed at [facility],  [place] [address], [place], [place] [address]      Studies: CT Cervical Spine Wo Contrast  Result Date: 06/17/2021 CLINICAL DATA:  Neck pain after unwitnessed fall. EXAM: CT CERVICAL SPINE WITHOUT CONTRAST TECHNIQUE: Multidetector CT imaging of the cervical spine was performed without intravenous contrast. Multiplanar CT image reconstructions were also generated. COMPARISON:  February 18, 2017. FINDINGS: Alignment: Minimal grade 1 anterolisthesis of C4-5 is noted secondary to posterior facet joint hypertrophy. Skull base and vertebrae: No acute fracture. No primary bone lesion or focal pathologic process. Soft tissues and spinal canal: No prevertebral fluid or swelling. No visible canal hematoma. Disc levels: Mild degenerative disc disease is noted at C4-5 and C5-6. Upper chest: No acute abnormality seen. Other: None. IMPRESSION: Mild multilevel degenerative disc disease is noted. No acute abnormality is noted in the cervical spine. Electronically Signed   By: Marijo Conception M.D.   On: 06/17/2021 09:38   DG Chest Portable 1 View  Result Date: 06/17/2021 CLINICAL DATA:  Weakness beginning today EXAM: PORTABLE CHEST 1 VIEW COMPARISON:  07/31/2020 FINDINGS: Heart size is normal. Upper lungs are clear. Mild patchy density at the medial right lung  base consistent with right lower lobe pneumonia. No effusion. No bone abnormality. IMPRESSION: Patchy pneumonia in the medial right lower lobe. Electronically Signed   By: Nelson Chimes M.D.   On: 06/17/2021 08:32      Flora Lipps, MD  [facility] 06/18/2021  If 7PM-7AM, please contact night-coverage

## 2021-06-18 NOTE — Progress Notes (Signed)
PHARMACIST - PHYSICIAN COMMUNICATION DR:   Louanne Belton CONCERNING: Antibiotic IV to Oral Route Change Policy  RECOMMENDATION: This patient is receiving Azithromycin by the intravenous route.  Based on criteria approved by the Pharmacy and Therapeutics Committee, the antibiotic(s) is/are being converted to the equivalent oral dose form(s).  Minda Ditto PharmD [facility] [phone] 06/18/2021, 11:55 AM  DESCRIPTION: These criteria include: Patient being treated for a respiratory tract infection, urinary tract infection, cellulitis or clostridium difficile associated diarrhea if on metronidazole The patient is not neutropenic and does not exhibit a GI malabsorption state The patient is eating (either orally or via tube) and/or has been taking other orally administered medications for a least 24 hours The patient is improving clinically and has a Tmax < 100.5  If you have questions about this conversion, please contact the Pharmacy Department  []   [phone] )  [facility] []   [phone] )  [facility] []   [phone] )  [facility] [x]   [phone] )  [facility] []   [phone] )  [facility]

## 2021-06-19 DIAGNOSIS — Z9114 Patient's other noncompliance with medication regimen: Secondary | ICD-10-CM

## 2021-06-19 LAB — GLUCOSE, CAPILLARY
Glucose-Capillary: 262 mg/dL — ABNORMAL HIGH (ref 70–99)
Glucose-Capillary: 356 mg/dL — ABNORMAL HIGH (ref 70–99)

## 2021-06-19 LAB — CBC
HCT: 35.6 % — ABNORMAL LOW (ref 36.0–46.0)
Hemoglobin: 12 g/dL (ref 12.0–15.0)
MCH: 31.5 pg (ref 26.0–34.0)
MCHC: 33.7 g/dL (ref 30.0–36.0)
MCV: 93.4 fL (ref 80.0–100.0)
Platelets: 244 10*3/uL (ref 150–400)
RBC: 3.81 MIL/uL — ABNORMAL LOW (ref 3.87–5.11)
RDW: 12.4 % (ref 11.5–15.5)
WBC: 7.9 10*3/uL (ref 4.0–10.5)
nRBC: 0 % (ref 0.0–0.2)

## 2021-06-19 LAB — COMPREHENSIVE METABOLIC PANEL
ALT: 20 U/L (ref 0–44)
AST: 27 U/L (ref 15–41)
Albumin: 2.9 g/dL — ABNORMAL LOW (ref 3.5–5.0)
Alkaline Phosphatase: 93 U/L (ref 38–126)
Anion gap: 8 (ref 5–15)
BUN: 15 mg/dL (ref 8–23)
CO2: 22 mmol/L (ref 22–32)
Calcium: 8.7 mg/dL — ABNORMAL LOW (ref 8.9–10.3)
Chloride: 103 mmol/L (ref 98–111)
Creatinine, Ser: 0.54 mg/dL (ref 0.44–1.00)
GFR, Estimated: >60 mL/min (ref >60)
Glucose, Bld: 368 mg/dL — ABNORMAL HIGH (ref 70–99)
Potassium: 4.4 mmol/L (ref 3.5–5.1)
Sodium: 133 mmol/L — ABNORMAL LOW (ref 135–145)
Total Bilirubin: 0.3 mg/dL (ref 0.3–1.2)
Total Protein: 6.6 g/dL (ref 6.5–8.1)

## 2021-06-19 LAB — MAGNESIUM: Magnesium: 1.8 mg/dL (ref 1.7–2.4)

## 2021-06-19 LAB — PHOSPHORUS: Phosphorus: 2.2 mg/dL — ABNORMAL LOW (ref 2.5–4.6)

## 2021-06-19 MED ORDER — DOXYCYCLINE HYCLATE 100 MG PO TABS
100.0000 mg | ORAL_TABLET | Freq: Two times a day (BID) | ORAL | 0 refills | Status: AC
Start: 2021-06-19 — End: 2021-06-22

## 2021-06-19 MED ORDER — LANTUS SOLOSTAR 100 UNIT/ML ~~LOC~~ SOPN: 50.0000 [IU] | PEN_INJECTOR | Freq: Every day | SUBCUTANEOUS | 3 refills | Status: DC

## 2021-06-19 MED ORDER — GUAIFENESIN-DM 100-10 MG/5ML PO SYRP: 5.0000 mL | ORAL_SOLUTION | ORAL | 0 refills | Status: DC | PRN

## 2021-06-19 MED ORDER — POTASSIUM PHOSPHATE MONOBASIC 500 MG PO TABS: 500.0000 mg | ORAL_TABLET | Freq: Two times a day (BID) | ORAL | 0 refills | Status: AC

## 2021-06-19 MED ORDER — POTASSIUM & SODIUM PHOSPHATES 280-160-250 MG PO PACK
1.0000 | PACK | Freq: Three times a day (TID) | ORAL | Status: DC
  Administered 2021-06-19: 1 via ORAL
  Filled 2021-06-19 (×3): qty 1

## 2021-06-19 MED ORDER — HUMALOG KWIKPEN 100 UNIT/ML ~~LOC~~ SOPN: 0.0000 [IU] | PEN_INJECTOR | Freq: Three times a day (TID) | SUBCUTANEOUS | 3 refills | Status: DC

## 2021-06-19 NOTE — Discharge Summary (Addendum)
Physician Discharge Summary  [name] [name] SUO:[id number] DOB: 1960/10/08 DOA: 06/17/2021  PCP: Charlott Rakes, MD  Admit date: 06/17/2021 Discharge date: 06/19/2021  Admitted From: Home  Discharge disposition: Home  Recommendations for Outpatient Follow-Up:   Follow up with your primary care provider in one week.  Will need good diabetic control as outpatient. Check CBC, BMP, magnesium in the next visit  Discharge Diagnosis:   Principal Problem:   DKA (diabetic ketoacidosis) ([place]) Rhinovirus infection Chronic adrenal insufficiency Diabetes mellitus type 2 GERD  Discharge Condition: Improved.  Diet recommendation: Low sodium, heart healthy.  Carbohydrate-modified.    Wound care: None.  Code status: Full.   History of Present Illness:   [name] [name] is a 60 y.o. female with past medical history of adrenal insufficiency, diabetes mellitus type 2, GERD, depression presented to hospital with cough and fatigue for 5 days with nasal congestion.  She tried DayQuil as outpatient but did not improve.  She also had decreased appetite, fatigue and generalized body aches.  Patient was then brought into the hospital.  Chest x-ray showed a right lower lobe pneumonia.  She also had elevated blood glucose levels and was off insulin for 1 month now.  She was then admitted hospital for DKA and pneumonia.  Hospital Course:   Following conditions were addressed during hospitalization as listed below,  Sepsis secondary to RLL pneumonia. Patient had signs of sepsis on presentation including tachycardia tachypnea, leukocytosis with pneumonia.  Patient has tested positive for rhinovirus on respiratory viral panel.  COVID and influenza was negative.  Procalcitonin negative.  Off supplemental oxygen at this time.  Patient received Rocephin and Zithromax during hospitalization which will be changed to doxycycline for next 3 days to complete course.  Rhinovirus infection.  Supportive care.       DKA Resolved at this time.  Off insulin for several days now.   On long-acting and sliding scale insulin at this time.  Patient states that that she will be able to get her medications this time.  Hemoglobin A1c was more than 15.  We will need good diabetes control as outpatient.   Hypokalemia.  Improved after replacement.  Potassium prior to discharge was 4.4.  Mild hypophosphatemia.  We will give the Neutra-Phos for few more days on discharge.    Chronic adrenal insuffiencey Was not taking her medications as outpatient.  Complained of generalized weakness, fatigue and generalized body ache.  Received Solu-Cortef in ED. patient is not on hormonal treatment at home.   Chronic body ache spasms.  Likely exacerbated by viral infection.  Patient takes duloxetine, muscle relaxant, gabapentin at home.  This will be resumed on discharge.   Depression Continue duloxetine  Disposition.  At this time, patient is stable for disposition with outpatient PCP follow-up.  Spoke with the patient's daughter about disposition today.  Medical Consultants:   None.  Procedures:    None Subjective:   Today, patient was seen and examined at bedside.  Feels better.  No nausea vomiting or body aches.  No fever chills or rigor.  Mild cough.  Discharge Exam:   Vitals:   06/19/21 0700 06/19/21 0814  BP: 123/66   Pulse: (!) 116   Resp: 13   Temp:  98.3 F (36.8 C)  SpO2: 97%    Vitals:   06/19/21 0300 06/19/21 0348 06/19/21 0700 06/19/21 0814  BP: (!) 146/86  123/66   Pulse: (!) 123  (!) 116   Resp: (!) 8  13  Temp:  97.9 F (36.6 C)  98.3 F (36.8 C)  TempSrc:  Oral  Oral  SpO2: 97%  97%   Weight:      Height:        General: Alert awake, not in obvious distress HENT: pupils equally reacting to light,  No scleral pallor or icterus noted. Oral mucosa is moist.  Chest:  Clear breath sounds.  Diminished breath sounds bilaterally. No crackles or wheezes.  CVS: S1 &S2 heard. No murmur.   Regular rate and rhythm. Abdomen: Soft, nontender, nondistended.  Bowel sounds are heard.   Extremities: No cyanosis, clubbing or edema.  Peripheral pulses are palpable. Psych: Alert, awake and oriented, normal mood CNS:  No cranial nerve deficits.  Power equal in all extremities.   Skin: Warm and dry.  No rashes noted.  The results of significant diagnostics from this hospitalization (including imaging, microbiology, ancillary and laboratory) are listed below for reference.     Diagnostic Studies:   CT Cervical Spine Wo Contrast  Result Date: 06/17/2021 CLINICAL DATA:  Neck pain after unwitnessed fall. EXAM: CT CERVICAL SPINE WITHOUT CONTRAST TECHNIQUE: Multidetector CT imaging of the cervical spine was performed without intravenous contrast. Multiplanar CT image reconstructions were also generated. COMPARISON:  February 18, 2017. FINDINGS: Alignment: Minimal grade 1 anterolisthesis of C4-5 is noted secondary to posterior facet joint hypertrophy. Skull base and vertebrae: No acute fracture. No primary bone lesion or focal pathologic process. Soft tissues and spinal canal: No prevertebral fluid or swelling. No visible canal hematoma. Disc levels: Mild degenerative disc disease is noted at C4-5 and C5-6. Upper chest: No acute abnormality seen. Other: None. IMPRESSION: Mild multilevel degenerative disc disease is noted. No acute abnormality is noted in the cervical spine. Electronically Signed   By: Marijo Conception M.D.   On: 06/17/2021 09:38   DG Chest Portable 1 View  Result Date: 06/17/2021 CLINICAL DATA:  Weakness beginning today EXAM: PORTABLE CHEST 1 VIEW COMPARISON:  07/31/2020 FINDINGS: Heart size is normal. Upper lungs are clear. Mild patchy density at the medial right lung base consistent with right lower lobe pneumonia. No effusion. No bone abnormality. IMPRESSION: Patchy pneumonia in the medial right lower lobe. Electronically Signed   By: Nelson Chimes M.D.   On: 06/17/2021 08:32      Labs:   Basic Metabolic Panel: Recent Labs  Lab 06/17/21 1540 06/17/21 2004 06/17/21 2344 06/18/21 0308 06/19/21 0247  NA 135 133* 133* 131* 133*  K 3.7 3.4* 3.1* 3.2* 4.4  CL 107 104 103 102 103  CO2 _0 21* 22  GLUCOSE 188* 152* 227* 250* 368*  BUN _1 CREATININE 0.49 0.39* 0.45 0.45 0.54  CALCIUM 8.9 9.2 8.7* 8.4* 8.7*  MG  --   --   --   --  1.8  PHOS  --   --   --   --  2.2*   GFR Estimated Creatinine Clearance: 69.1 mL/min (by C-G formula based on SCr of 0.54 mg/dL). Liver Function Tests: Recent Labs  Lab 06/19/21 0247  AST 27  ALT 20  ALKPHOS 93  BILITOT 0.3  PROT 6.6  ALBUMIN 2.9*   No results for input(s): LIPASE, AMYLASE in the last 168 hours. No results for input(s): AMMONIA in the last 168 hours. Coagulation profile No results for input(s): INR, PROTIME in the last 168 hours.  CBC: Recent Labs  Lab 06/17/21 0153 06/19/21 0247  WBC 11.1* 7.9  HGB 12.7 12.0  HCT 37.9 35.6*  MCV 95.0 93.4  PLT 251 244   Cardiac Enzymes: Recent Labs  Lab 06/17/21 0153  CKTOTAL 16*   BNP: Invalid input(s): POCBNP CBG: Recent Labs  Lab 06/18/21 0903 06/18/21 1140 06/18/21 1655 06/18/21 2125 06/19/21 0810  GLUCAP 261* 184* 175* 223* 262*   D-Dimer No results for input(s): DDIMER in the last 72 hours. Hgb A1c No results for input(s): HGBA1C in the last 72 hours. Lipid Profile No results for input(s): CHOL, HDL, LDLCALC, TRIG, CHOLHDL, LDLDIRECT in the last 72 hours. Thyroid function studies Recent Labs    06/17/21 0835  TSH 1.369   Anemia work up No results for input(s): VITAMINB12, FOLATE, FERRITIN, TIBC, IRON, RETICCTPCT in the last 72 hours. Microbiology Recent Results (from the past 240 hour(s))  Resp Panel by RT-PCR (Flu A&B, Covid) Nasopharyngeal Swab     Status: None   Collection Time: 06/17/21  2:12 AM   Specimen: Nasopharyngeal Swab; Nasopharyngeal(NP) swabs in vial transport medium  Result Value Ref Range  Status   SARS Coronavirus 2 by RT PCR NEGATIVE NEGATIVE Final    Comment: (NOTE) SARS-CoV-2 target nucleic acids are NOT DETECTED.  The SARS-CoV-2 RNA is generally detectable in upper respiratory specimens during the acute phase of infection. The lowest concentration of SARS-CoV-2 viral copies this assay can detect is 138 copies/mL. A negative result does not preclude SARS-Cov-2 infection and should not be used as the sole basis for treatment or other patient management decisions. A negative result may occur with  improper specimen collection/handling, submission of specimen other than nasopharyngeal swab, presence of viral mutation(s) within the areas targeted by this assay, and inadequate number of viral copies(<138 copies/mL). A negative result must be combined with clinical observations, patient history, and epidemiological information. The expected result is Negative.  Fact Sheet for Patients:  EntrepreneurPulse.com.au  Fact Sheet for Healthcare Providers:  IncredibleEmployment.be  This test is no t yet approved or cleared by the [place] FDA and  has been authorized for detection and/or diagnosis of SARS-CoV-2 by FDA under an Emergency Use Authorization (EUA). This EUA will remain  in effect (meaning this test can be used) for the duration of the COVID-19 declaration under Section 564(b)(1) of the Act, 21 U.S.C.section 360bbb-3(b)(1), unless the authorization is terminated  or revoked sooner.       Influenza A by PCR NEGATIVE NEGATIVE Final   Influenza B by PCR NEGATIVE NEGATIVE Final    Comment: (NOTE) The Xpert Xpress SARS-CoV-2/FLU/RSV plus assay is intended as an aid in the diagnosis of influenza from Nasopharyngeal swab specimens and should not be used as a sole basis for treatment. Nasal washings and aspirates are unacceptable for Xpert Xpress SARS-CoV-2/FLU/RSV testing.  Fact Sheet for  Patients: EntrepreneurPulse.com.au  Fact Sheet for Healthcare Providers: IncredibleEmployment.be  This test is not yet approved or cleared by the [place] FDA and has been authorized for detection and/or diagnosis of SARS-CoV-2 by FDA under an Emergency Use Authorization (EUA). This EUA will remain in effect (meaning this test can be used) for the duration of the COVID-19 declaration under Section 564(b)(1) of the Act, 21 U.S.C. section 360bbb-3(b)(1), unless the authorization is terminated or revoked.  Performed at [facility], [place] [address], [place], [place] [address]   Respiratory (~20 pathogens) panel by PCR     Status: Abnormal   Collection Time: 06/17/21  1:12 PM   Specimen: Nasopharyngeal Swab; Respiratory  Result Value Ref Range Status   Adenovirus NOT DETECTED  NOT DETECTED Final   Coronavirus 229E NOT DETECTED NOT DETECTED Final    Comment: (NOTE) The Coronavirus on the Respiratory Panel, DOES NOT test for the novel  Coronavirus (2019 nCoV)    Coronavirus HKU1 NOT DETECTED NOT DETECTED Final   Coronavirus NL63 NOT DETECTED NOT DETECTED Final   Coronavirus OC43 NOT DETECTED NOT DETECTED Final   Metapneumovirus NOT DETECTED NOT DETECTED Final   Rhinovirus / Enterovirus DETECTED (A) NOT DETECTED Final   Influenza A NOT DETECTED NOT DETECTED Final   Influenza B NOT DETECTED NOT DETECTED Final   Parainfluenza Virus 1 NOT DETECTED NOT DETECTED Final   Parainfluenza Virus 2 NOT DETECTED NOT DETECTED Final   Parainfluenza Virus 3 NOT DETECTED NOT DETECTED Final   Parainfluenza Virus 4 NOT DETECTED NOT DETECTED Final   Respiratory Syncytial Virus NOT DETECTED NOT DETECTED Final   Bordetella pertussis NOT DETECTED NOT DETECTED Final   Bordetella Parapertussis NOT DETECTED NOT DETECTED Final   Chlamydophila pneumoniae NOT DETECTED NOT DETECTED Final   Mycoplasma pneumoniae NOT DETECTED NOT DETECTED Final    Comment:  Performed at [facility], [place] [address], [place], [place] [address]  MRSA Next Gen by PCR, Nasal     Status: None   Collection Time: 06/17/21  4:18 PM   Specimen: Nasal Mucosa; Nasal Swab  Result Value Ref Range Status   MRSA by PCR Next Gen NOT DETECTED NOT DETECTED Final    Comment: (NOTE) The GeneXpert MRSA Assay (FDA approved for NASAL specimens only), is one component of a comprehensive MRSA colonization surveillance program. It is not intended to diagnose MRSA infection nor to guide or monitor treatment for MRSA infections. Test performance is not FDA approved in patients less than 63 years old. Performed at [facility], [place] [address], [place], [place] [address]      Discharge Instructions:   Discharge Instructions     Call MD for:  persistant nausea and vomiting   Complete by: As directed    Call MD for:  severe uncontrolled pain   Complete by: As directed    Diet Carb Modified   Complete by: As directed    Discharge instructions   Complete by: As directed    Follow-up with your primary care provider in 1 week.  Please continue to take insulin and other medications as prescribed without interruption.   Increase activity slowly   Complete by: As directed       Allergies as of 06/19/2021       Reactions   Tramadol Nausea And Vomiting   REACTION: Projectile vomiting        Medication List     STOP taking these medications    meloxicam 7.5 MG tablet Commonly known as: MOBIC       TAKE these medications    acetaminophen 500 MG tablet Commonly known as: TYLENOL Take 2 tablets (1,000 mg total) by mouth every 6 (six) hours as needed for mild pain or headache (pain).   Centrum Silver 50+Men Tabs Take 1 tablet by mouth daily.   doxycycline 100 MG tablet Commonly known as: VIBRA-TABS Take 1 tablet (100 mg total) by mouth 2 (two) times daily for 3 days.   DULoxetine 60 MG capsule Commonly known as: CYMBALTA TAKE 1 CAPSULE  (60 MG TOTAL) BY MOUTH 2 (TWO) TIMES DAILY. What changed:  how much to take how to take this when to take this   feeding supplement (GLUCERNA SHAKE) Liqd TAKE 237 MLS  BY MOUTH 2 (TWO) TIMES DAILY BETWEEN MEALS. What changed:  how much to take how to take this when to take this   gabapentin 600 MG tablet Commonly known as: NEURONTIN TAKE ONE TABLET (600 MG) BY MOUTH EVERY MORNING AND AFTERNOON, TAKE TWO TABLETS AT NIGHT   guaiFENesin-dextromethorphan 100-10 MG/5ML syrup Commonly known as: ROBITUSSIN DM Take 5 mLs by mouth every 4 (four) hours as needed for cough (chest congestion).   HumaLOG KwikPen 100 UNIT/ML KwikPen Generic drug: insulin lispro Inject 0-12 Units into the skin 3 (three) times daily. Per sliding scale What changed: Another medication with the same name was removed. Continue taking this medication, and follow the directions you see here.   Lantus SoloStar 100 UNIT/ML Solostar Pen Generic drug: insulin glargine Inject 50 Units into the skin at bedtime. What changed: Another medication with the same name was removed. Continue taking this medication, and follow the directions you see here.   midodrine 10 MG tablet Commonly known as: PROAMATINE Take 1 tablet (10 mg total) by mouth 3 (three) times daily with meals.   Pen Needles 31G X 8 MM Misc Use as directed   PenTips 31G X 8 MM Misc Generic drug: Insulin Pen Needle USE AS DIRECTED   potassium phosphate (monobasic) 500 MG tablet Commonly known as: K-PHOS ORIGINAL Take 1 tablet (500 mg total) by mouth 2 (two) times daily with a meal for 5 days.   tiZANidine 4 MG tablet Commonly known as: Zanaflex Take 1 tablet (4 mg total) by mouth every 8 (eight) hours as needed for muscle spasms.   True Metrix Blood Glucose Test test strip Generic drug: glucose blood Use as instructed to check blood sugar once daily.   True Metrix Meter w/Device Kit Use as instructed to check blood sugar once daily.           Time coordinating discharge: 39 minutes  Signed:  Maegen Wigle  [facility] 06/19/2021, 9:12 AM

## 2021-06-19 NOTE — Progress Notes (Signed)
Chaplain engaged in an initial visit with [name] [name] who is excited about discharging home.  She is also looking forward to feeling like herself again.  She noted that she still has a cough and feels like the mucus is not coming out like it needs too.  Chaplain offered a blessing of healing over her.  Chaplain and [name] [name] talked about some at-home remedies for congestion and cough.   Chaplain offered presence, listening and support.    06/19/21 1400  Clinical Encounter Type  Visited With Patient  Visit Type Initial

## 2021-06-19 NOTE — Progress Notes (Signed)
Discharge education given to patient. All questions answered. PIVs removed. Belongings (clothes) returned to patient. Patient transferred down to main entrance via wheelchair by Shirlee Limerick, [place]. Patient transferred safely from wheelchair into car per NT.

## 2021-06-20 ENCOUNTER — Telehealth: Payer: Self-pay

## 2021-06-20 ENCOUNTER — Other Ambulatory Visit: Payer: Self-pay

## 2021-06-20 LAB — HEMOGLOBIN A1C
Hgb A1c MFr Bld: >15.5 % — ABNORMAL HIGH (ref 4.8–5.6)
Mean Plasma Glucose: >398 mg/dL

## 2021-06-20 NOTE — Telephone Encounter (Signed)
Transition Care Management Follow-up Telephone Call Date of discharge and from where: 06/19/2021 from [facility] How have you been since you were released from the hospital? Pt stated that he is feeling okay  Any questions or concerns? Pt would like to have new glucometer kit with lancets and strips   Items Reviewed: Did the pt receive and understand the discharge instructions provided? Yes  Medications obtained and verified? Yes  Other? No  Any new allergies since your discharge? No  Dietary orders reviewed? No Do you have support at home? Yes roommate   Home Care and Equipment/Supplies: Were home health services ordered? N/A    Functional Questionnaire: (I = Independent and D = Dependent) ADLs: I   Bathing/Dressing- I   Meal Prep- I   Eating- I   Maintaining continence- I   Transferring/Ambulation- I   Managing Meds- I     Follow up appointments reviewed:   PCP Hospital f/u appt confirmed? 06/24/2021 with [facility] f/u appt confirmed? NO Are transportation arrangements needed? No  If their condition worsens, is the pt aware to call PCP or go to the Emergency Dept.? Yes Was the patient provided with contact information for the PCP's office or ED? Yes Was to pt encouraged to call back with questions or concerns? Yes

## 2021-06-24 ENCOUNTER — Encounter: Payer: Self-pay | Admitting: Family Medicine

## 2021-06-24 ENCOUNTER — Other Ambulatory Visit: Payer: Self-pay

## 2021-06-24 ENCOUNTER — Ambulatory Visit: Payer: Medicare Other | Attending: Family Medicine | Admitting: Family Medicine

## 2021-06-24 VITALS — BP 114/76 | HR 118 | Ht 66.0 in | Wt 140.2 lb

## 2021-06-24 DIAGNOSIS — E2740 Unspecified adrenocortical insufficiency: Secondary | ICD-10-CM | POA: Diagnosis not present

## 2021-06-24 DIAGNOSIS — Z794 Long term (current) use of insulin: Secondary | ICD-10-CM | POA: Diagnosis not present

## 2021-06-24 DIAGNOSIS — R600 Localized edema: Secondary | ICD-10-CM | POA: Diagnosis not present

## 2021-06-24 DIAGNOSIS — E1143 Type 2 diabetes mellitus with diabetic autonomic (poly)neuropathy: Secondary | ICD-10-CM

## 2021-06-24 DIAGNOSIS — R058 Other specified cough: Secondary | ICD-10-CM

## 2021-06-24 DIAGNOSIS — Z1231 Encounter for screening mammogram for malignant neoplasm of breast: Secondary | ICD-10-CM

## 2021-06-24 LAB — GLUCOSE, POCT (MANUAL RESULT ENTRY): POC Glucose: 280 mg/dl — AB (ref 70–99)

## 2021-06-24 MED ORDER — DULOXETINE HCL 60 MG PO CPEP: 60.0000 mg | ORAL_CAPSULE | Freq: Two times a day (BID) | ORAL | 3 refills | Status: DC

## 2021-06-24 MED ORDER — ACCU-CHEK GUIDE VI STRP: ORAL_STRIP | 12 refills | Status: DC

## 2021-06-24 MED ORDER — ACCU-CHEK SOFTCLIX LANCETS MISC: 12 refills | Status: DC

## 2021-06-24 MED ORDER — GUAIFENESIN-DM 100-10 MG/5ML PO SYRP: 5.0000 mL | ORAL_SOLUTION | ORAL | 0 refills | Status: DC | PRN

## 2021-06-24 MED ORDER — ACCU-CHEK GUIDE W/DEVICE KIT: PACK | 0 refills | Status: AC

## 2021-06-24 NOTE — Progress Notes (Signed)
 Subjective:  Patient ID: [name] [name], female    DOB: 07/07/1961  Age: 60 y.o. MRN: [id number]  CC: Hospitalization Follow-up   HPI [name] [name] is a 60 y.o. year old female with a history of type 2 diabetes mellitus (A1c >15), orthostatic hypotension, adrenal insufficiency, noncompliance, Depression who presents today for transitional of care visit. She was hospitalized last month for DKA (after being out of her insulin for 1 month) and sepsis secondary to pneumonia in addition to rhinovirus. Treated with IV fluids, insulin, IV antibiotics with improvement in symptoms. Due to her weakness she was given Solu-Cortef in the ED for her chronic adrenal insufficiency.  Subsequently discharged on doxycycline.  Interval History: She forgot to take her noon dose of Novolog today but endorses compliance prior to now. Denies complaints of hypoglycemic symptoms, blurry vision and for her neuropathy she is on Cymbalta. She still has some dyspnea and cough which is productive and she has completed her course of antibiotics.  She continues to experience some weakness.  For her adrenal insufficiency she is currently not on medications and is not followed by endocrine.   Denies additional concerns today. 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   Torn meniscus    Vertigo     Past Surgical History:  Procedure  Laterality Date   ABDOMINAL HYSTERECTOMY     CESAREAN SECTION     ORIF ANKLE FRACTURE Right 02/25/2020   Procedure: OPEN REDUCTION INTERNAL FIXATION (ORIF) ANKLE FRACTURE;  Surgeon: Bowers, James R, MD;  Location: [facility] ORS;  Service: Orthopedics;  Laterality: Right;    Family History  Problem Relation Age of Onset   Depression Mother        type 1   Diabetes Mother    Renal Disease Mother    Lung cancer Father    Heart attack Brother        Died age 35 - was told due to massive MI/heart "exploded"   Heart failure Maternal Grandmother        Details not totally clear    Allergies  Allergen Reactions   Tramadol Nausea And Vomiting    REACTION: Projectile vomiting    Outpatient Medications Prior to Visit  Medication Sig Dispense Refill   acetaminophen (TYLENOL) 500 MG tablet Take 2 tablets (1,000 mg total) by mouth every 6 (six) hours as needed for mild pain or headache (pain).  0   feeding supplement, GLUCERNA SHAKE, (GLUCERNA SHAKE) LIQD TAKE 237 MLS BY MOUTH 2 (TWO) TIMES DAILY BETWEEN MEALS. (Patient taking differently: Take 237 mLs by mouth 3 (three) times daily between meals.) 14220 mL 0   gabapentin (NEURONTIN) 600 MG tablet TAKE ONE TABLET (600 MG) BY MOUTH EVERY MORNING AND AFTERNOON, TAKE TWO TABLETS AT NIGHT 120 tablet   3   HUMALOG KWIKPEN 100 UNIT/ML KwikPen Inject 0-12 Units into the skin 3 (three) times daily. Per sliding scale 30 mL 3   insulin glargine (LANTUS SOLOSTAR) 100 UNIT/ML Solostar Pen Inject 50 Units into the skin at bedtime. 30 mL 3   Insulin Pen Needle (PEN NEEDLES) 31G X 8 MM MISC Use as directed 100 each 6   Insulin Pen Needle 31G X 8 MM MISC USE AS DIRECTED 100 each 6   midodrine (PROAMATINE) 10 MG tablet Take 1 tablet (10 mg total) by mouth 3 (three) times daily with meals. (Patient not taking: Reported on 06/17/2021) 90 tablet 3   Multiple Vitamins-Minerals (CENTRUM SILVER 50+MEN) TABS Take 1 tablet by mouth daily. (Patient not taking: Reported on  11/25/2020)     potassium phosphate, monobasic, (K-PHOS ORIGINAL) 500 MG tablet Take 1 tablet (500 mg total) by mouth 2 (two) times daily with a meal for 5 days. 10 tablet 0   tiZANidine (ZANAFLEX) 4 MG tablet Take 1 tablet (4 mg total) by mouth every 8 (eight) hours as needed for muscle spasms. (Patient not taking: Reported on 06/17/2021) 90 tablet 1   Blood Glucose Monitoring Suppl (TRUE METRIX METER) w/Device KIT Use as instructed to check blood sugar once daily. 1 kit 0   DULoxetine (CYMBALTA) 60 MG capsule TAKE 1 CAPSULE (60 MG TOTAL) BY MOUTH 2 (TWO) TIMES DAILY. (Patient taking differently: Take 60 mg by mouth 2 (two) times daily.) 60 capsule 3   glucose blood (TRUE METRIX BLOOD GLUCOSE TEST) test strip Use as instructed to check blood sugar once daily. 100 each 2   guaiFENesin-dextromethorphan (ROBITUSSIN DM) 100-10 MG/5ML syrup Take 5 mLs by mouth every 4 (four) hours as needed for cough (chest congestion). 118 mL 0   No facility-administered medications prior to visit.     ROS Review of Systems  Constitutional:  Negative for activity change and appetite change.  HENT:  Negative for congestion, sinus pressure and sore throat.   Eyes:  Negative for visual disturbance.  Respiratory:  Positive for cough and shortness of breath. Negative for chest tightness and wheezing.   Cardiovascular:  Negative for chest pain and palpitations.  Gastrointestinal:  Negative for abdominal distention, abdominal pain and constipation.  Endocrine: Negative for polydipsia.  Genitourinary:  Negative for dysuria and frequency.  Musculoskeletal:  Negative for arthralgias and back pain.  Skin:  Negative for rash.  Neurological:  Positive for weakness. Negative for tremors, light-headedness and numbness.  Hematological:  Does not bruise/bleed easily.  Psychiatric/Behavioral:  Negative for agitation and behavioral problems.    Objective:  BP 114/76   Pulse (!) 118   Ht 5' 6" (1.676 m)   Wt 140 lb 3.2 oz  (63.6 kg)   SpO2 96%   BMI 22.63 kg/m   BP/Weight 06/24/2021 06/19/2021 06/17/2021  Systolic BP 114 151 -  Diastolic BP 76 80 -  Wt. (Lbs) 140.2 - 134.04  BMI 22.63 - 21.63      Physical Exam Constitutional:      Appearance: She is well-developed.  Cardiovascular:     Rate and Rhythm: Tachycardia present.     Heart sounds: Normal heart sounds. No murmur heard. Pulmonary:     Effort: Pulmonary effort is normal.     Breath sounds: Normal breath sounds. No wheezing or rales.  Chest:     Chest wall: No tenderness.  Abdominal:     General: Bowel sounds are normal. There is no distension.     Palpations:   Abdomen is soft. There is no mass.     Tenderness: There is no abdominal tenderness.  Musculoskeletal:        General: Normal range of motion.     Right lower leg: Edema present.     Left lower leg: Edema present.  Neurological:     Mental Status: She is alert and oriented to person, place, and time.  Psychiatric:        Mood and Affect: Mood normal.   Diabetic Foot Exam - Simple   Simple Foot Form Diabetic Foot exam was performed with the following findings: Yes 06/24/2021  2:03 PM  Visual Inspection No deformities, no ulcerations, no other skin breakdown bilaterally: Yes Sensation Testing Intact to touch and monofilament testing bilaterally: Yes Pulse Check Posterior Tibialis and Dorsalis pulse intact bilaterally: Yes Comments     CMP Latest Ref Rng & Units 06/19/2021 06/18/2021 06/17/2021  Glucose 70 - 99 mg/dL 368(H) 250(H) 227(H)  BUN 8 - 23 mg/dL _0 Creatinine 0.44 - 1.00 mg/dL 0.54 0.45 0.45  Sodium 135 - 145 mmol/L 133(L) 131(L) 133(L)  Potassium 3.5 - 5.1 mmol/L 4.4 3.2(L) 3.1(L)  Chloride 98 - 111 mmol/L 103 102 103  CO2 22 - 32 mmol/L 22 21(L) 23  Calcium 8.9 - 10.3 mg/dL 8.7(L) 8.4(L) 8.7(L)  Total Protein 6.5 - 8.1 g/dL 6.6 - -  Total Bilirubin 0.3 - 1.2 mg/dL 0.3 - -  Alkaline Phos 38 - 126 U/L 93 - -  AST 15 - 41 U/L 27 - -  ALT 0 - 44 U/L  20 - -    Lipid Panel     Component Value Date/Time   CHOL 197 03/15/2019 1056   TRIG 156 (H) 07/29/2020 0730   HDL 36 (L) 03/15/2019 1056   CHOLHDL 5.5 (H) 03/15/2019 1056   CHOLHDL 7.6 04/10/2016 0714   VLDL 65 (H) 04/10/2016 0714   LDLCALC 109 (H) 03/15/2019 1056    CBC    Component Value Date/Time   WBC 7.9 06/19/2021 0247   RBC 3.81 (L) 06/19/2021 0247   HGB 12.0 06/19/2021 0247   HGB 13.8 10/14/2020 1602   HCT 35.6 (L) 06/19/2021 0247   HCT 40.1 10/14/2020 1602   PLT 244 06/19/2021 0247   PLT 295 10/14/2020 1602   MCV 93.4 06/19/2021 0247   MCV 91 10/14/2020 1602   MCH 31.5 06/19/2021 0247   MCHC 33.7 06/19/2021 0247   RDW 12.4 06/19/2021 0247   RDW 12.7 10/14/2020 1602   LYMPHSABS 3.5 (H) 10/14/2020 1602   MONOABS 0.8 08/06/2020 0757   EOSABS 0.2 10/14/2020 1602   BASOSABS 0.0 10/14/2020 1602    Lab Results  Component Value Date   HGBA1C >15.5 (H) 06/17/2021    Assessment & Plan:  1. Type 2 diabetes mellitus with diabetic autonomic neuropathy, with long-term current use of insulin (HCC) Uncontrolled with A1c of 15.5; goal is less than 7.0 CBG of 280 She endorses compliance with her medications and has been without her insulin at the time A1c was drawn hence I will make no regimen change today Counseled on Diabetic diet, my plate method, 197 minutes of moderate intensity exercise/week Blood sugar logs with fasting goals of 80-120 mg/dl, random of less than 180 and in the event of sugars less than 60 mg/dl or greater than 400 mg/dl encouraged to notify the clinic. Advised on the need for annual eye exams, annual foot exams, Pneumonia vaccine. - POCT glucose (manual entry) - Ambulatory referral  to Endocrinology - DULoxetine (CYMBALTA) 60 MG capsule; Take 1 capsule (60 mg total) by mouth 2 (two) times daily.  Dispense: 60 capsule; Refill: 3 - glucose blood (ACCU-CHEK GUIDE) test strip; Use as instructed tid  Dispense: 100 each; Refill: 12 - Blood Glucose  Monitoring Suppl (ACCU-CHEK GUIDE) w/Device KIT; Use as directed tid  Dispense: 1 kit; Refill: 0 - Accu-Chek Softclix Lancets lancets; Use as instructed tid before meals  Dispense: 100 each; Refill: 12  2. Encounter for screening mammogram for malignant neoplasm of breast - MM DIGITAL SCREENING BILATERAL; Future  3. Adrenal insufficiency (HCC) Referral to endocrinology has been placed  4. Other cough Lung exam is clear Will refill antitussive - guaiFENesin-dextromethorphan (ROBITUSSIN DM) 100-10 MG/5ML syrup; Take 5 mLs by mouth every 4 (four) hours as needed for cough (chest congestion).  Dispense: 118 mL; Refill: 0  5. Pedal edema Could be dependent edema Encouraged to comply with a low-sodium diet, elevate feet, use compression stockings  If symptoms persist consider diuretic   Meds ordered this encounter  Medications   guaiFENesin-dextromethorphan (ROBITUSSIN DM) 100-10 MG/5ML syrup    Sig: Take 5 mLs by mouth every 4 (four) hours as needed for cough (chest congestion).    Dispense:  118 mL    Refill:  0   DULoxetine (CYMBALTA) 60 MG capsule    Sig: Take 1 capsule (60 mg total) by mouth 2 (two) times daily.    Dispense:  60 capsule    Refill:  3   glucose blood (ACCU-CHEK GUIDE) test strip    Sig: Use as instructed tid    Dispense:  100 each    Refill:  12   Blood Glucose Monitoring Suppl (ACCU-CHEK GUIDE) w/Device KIT    Sig: Use as directed tid    Dispense:  1 kit    Refill:  0   Accu-Chek Softclix Lancets lancets    Sig: Use as instructed tid before meals    Dispense:  100 each    Refill:  12    Follow-up: Return in about 6 weeks (around 08/05/2021) for Complete physical exam.       Enobong Newlin, MD, FAAFP. [facility] [place], [place] [phone]   06/25/2021, 12:53 PM  

## 2021-07-22 ENCOUNTER — Other Ambulatory Visit: Payer: Self-pay

## 2021-07-22 ENCOUNTER — Other Ambulatory Visit: Payer: Self-pay | Admitting: Family Medicine

## 2021-07-22 DIAGNOSIS — E1143 Type 2 diabetes mellitus with diabetic autonomic (poly)neuropathy: Secondary | ICD-10-CM

## 2021-07-22 DIAGNOSIS — Z794 Long term (current) use of insulin: Secondary | ICD-10-CM

## 2021-07-22 NOTE — Telephone Encounter (Signed)
Copied from [place] [id number]. Topic: General - Other >> Jul 22, 2021 10:07 AM Tessa Lerner A wrote: Reason for CRM: The patient would like to be contacted by a member of staff when possible  The patient has been in contact with their pharmacy and told that their prescription for gabapentin (NEURONTIN) 600 MG tablet [[id number]]  has been discontinued   Please contact further when possible

## 2021-07-25 NOTE — Telephone Encounter (Signed)
Spoke to patient and she stated that she was informed by [facility] that medication was d/c'd.   Patient advised she needed a refill and the prescription had not been discontinued.   Will forward to PCP for refill request. Wants rx sent to [facility] on [address].

## 2021-07-28 MED ORDER — GABAPENTIN 600 MG PO TABS: ORAL_TABLET | ORAL | 3 refills | Status: DC

## 2021-08-16 ENCOUNTER — Observation Stay (HOSPITAL_COMMUNITY): Payer: Medicare Other

## 2021-08-16 ENCOUNTER — Inpatient Hospital Stay (HOSPITAL_COMMUNITY)
Admission: EM | Admit: 2021-08-16 | Discharge: 2021-08-20 | DRG: 064 | Disposition: A | Discharge status: Rehabilitation Facility | Payer: Medicare Other | Attending: Internal Medicine | Admitting: Internal Medicine

## 2021-08-16 ENCOUNTER — Other Ambulatory Visit: Payer: Self-pay

## 2021-08-16 ENCOUNTER — Emergency Department (HOSPITAL_COMMUNITY): Payer: Medicare Other

## 2021-08-16 ENCOUNTER — Encounter (HOSPITAL_COMMUNITY): Payer: Self-pay

## 2021-08-16 DIAGNOSIS — Z885 Allergy status to narcotic agent status: Secondary | ICD-10-CM

## 2021-08-16 DIAGNOSIS — Z91199 Patient's noncompliance with other medical treatment and regimen due to unspecified reason: Secondary | ICD-10-CM

## 2021-08-16 DIAGNOSIS — I6381 Other cerebral infarction due to occlusion or stenosis of small artery: Principal | ICD-10-CM | POA: Diagnosis present

## 2021-08-16 DIAGNOSIS — Z841 Family history of disorders of kidney and ureter: Secondary | ICD-10-CM

## 2021-08-16 DIAGNOSIS — F322 Major depressive disorder, single episode, severe without psychotic features: Secondary | ICD-10-CM | POA: Diagnosis present

## 2021-08-16 DIAGNOSIS — E113392 Type 2 diabetes mellitus with moderate nonproliferative diabetic retinopathy without macular edema, left eye: Secondary | ICD-10-CM | POA: Diagnosis present

## 2021-08-16 DIAGNOSIS — Z87891 Personal history of nicotine dependence: Secondary | ICD-10-CM

## 2021-08-16 DIAGNOSIS — Z79899 Other long term (current) drug therapy: Secondary | ICD-10-CM

## 2021-08-16 DIAGNOSIS — I951 Orthostatic hypotension: Secondary | ICD-10-CM | POA: Diagnosis present

## 2021-08-16 DIAGNOSIS — G909 Disorder of the autonomic nervous system, unspecified: Secondary | ICD-10-CM | POA: Diagnosis present

## 2021-08-16 DIAGNOSIS — I10 Essential (primary) hypertension: Secondary | ICD-10-CM | POA: Diagnosis present

## 2021-08-16 DIAGNOSIS — Z20822 Contact with and (suspected) exposure to covid-19: Secondary | ICD-10-CM | POA: Diagnosis present

## 2021-08-16 DIAGNOSIS — M5030 Other cervical disc degeneration, unspecified cervical region: Secondary | ICD-10-CM | POA: Diagnosis present

## 2021-08-16 DIAGNOSIS — Z66 Do not resuscitate: Secondary | ICD-10-CM | POA: Diagnosis present

## 2021-08-16 DIAGNOSIS — J189 Pneumonia, unspecified organism: Secondary | ICD-10-CM | POA: Diagnosis present

## 2021-08-16 DIAGNOSIS — H538 Other visual disturbances: Secondary | ICD-10-CM | POA: Diagnosis not present

## 2021-08-16 DIAGNOSIS — Z818 Family history of other mental and behavioral disorders: Secondary | ICD-10-CM

## 2021-08-16 DIAGNOSIS — Z794 Long term (current) use of insulin: Secondary | ICD-10-CM

## 2021-08-16 DIAGNOSIS — R000 Tachycardia, unspecified: Secondary | ICD-10-CM | POA: Diagnosis present

## 2021-08-16 DIAGNOSIS — E2740 Unspecified adrenocortical insufficiency: Secondary | ICD-10-CM | POA: Diagnosis present

## 2021-08-16 DIAGNOSIS — E1142 Type 2 diabetes mellitus with diabetic polyneuropathy: Secondary | ICD-10-CM | POA: Diagnosis present

## 2021-08-16 DIAGNOSIS — H53481 Generalized contraction of visual field, right eye: Secondary | ICD-10-CM | POA: Diagnosis present

## 2021-08-16 DIAGNOSIS — Z8249 Family history of ischemic heart disease and other diseases of the circulatory system: Secondary | ICD-10-CM

## 2021-08-16 DIAGNOSIS — G894 Chronic pain syndrome: Secondary | ICD-10-CM | POA: Diagnosis present

## 2021-08-16 DIAGNOSIS — E1165 Type 2 diabetes mellitus with hyperglycemia: Secondary | ICD-10-CM

## 2021-08-16 DIAGNOSIS — F1290 Cannabis use, unspecified, uncomplicated: Secondary | ICD-10-CM | POA: Diagnosis present

## 2021-08-16 DIAGNOSIS — E1143 Type 2 diabetes mellitus with diabetic autonomic (poly)neuropathy: Secondary | ICD-10-CM

## 2021-08-16 DIAGNOSIS — Z833 Family history of diabetes mellitus: Secondary | ICD-10-CM

## 2021-08-16 DIAGNOSIS — H53461 Homonymous bilateral field defects, right side: Secondary | ICD-10-CM | POA: Diagnosis present

## 2021-08-16 DIAGNOSIS — R42 Dizziness and giddiness: Secondary | ICD-10-CM

## 2021-08-16 DIAGNOSIS — Z801 Family history of malignant neoplasm of trachea, bronchus and lung: Secondary | ICD-10-CM

## 2021-08-16 LAB — COMPREHENSIVE METABOLIC PANEL
ALT: 29 U/L (ref 0–44)
AST: 38 U/L (ref 15–41)
Albumin: 3.4 g/dL — ABNORMAL LOW (ref 3.5–5.0)
Alkaline Phosphatase: 104 U/L (ref 38–126)
Anion gap: 10 (ref 5–15)
BUN: 12 mg/dL (ref 8–23)
CO2: 26 mmol/L (ref 22–32)
Calcium: 9.4 mg/dL (ref 8.9–10.3)
Chloride: 103 mmol/L (ref 98–111)
Creatinine, Ser: 0.62 mg/dL (ref 0.44–1.00)
GFR, Estimated: >60 mL/min (ref >60)
Glucose, Bld: 105 mg/dL — ABNORMAL HIGH (ref 70–99)
Potassium: 3.6 mmol/L (ref 3.5–5.1)
Sodium: 139 mmol/L (ref 135–145)
Total Bilirubin: 0.9 mg/dL (ref 0.3–1.2)
Total Protein: 6.6 g/dL (ref 6.5–8.1)

## 2021-08-16 LAB — GLUCOSE, CAPILLARY
Glucose-Capillary: 392 mg/dL — ABNORMAL HIGH (ref 70–99)
Glucose-Capillary: 357 mg/dL — ABNORMAL HIGH (ref 70–99)
Glucose-Capillary: 367 mg/dL — ABNORMAL HIGH (ref 70–99)

## 2021-08-16 LAB — CBC WITH DIFFERENTIAL/PLATELET
Abs Immature Granulocytes: 0.04 10*3/uL (ref 0.00–0.07)
Basophils Absolute: 0 10*3/uL (ref 0.0–0.1)
Basophils Relative: 0 %
Eosinophils Absolute: 0.3 10*3/uL (ref 0.0–0.5)
Eosinophils Relative: 4 %
HCT: 41.9 % (ref 36.0–46.0)
Hemoglobin: 14.3 g/dL (ref 12.0–15.0)
Immature Granulocytes: 1 %
Lymphocytes Relative: 23 %
Lymphs Abs: 1.6 10*3/uL (ref 0.7–4.0)
MCH: 31.3 pg (ref 26.0–34.0)
MCHC: 34.1 g/dL (ref 30.0–36.0)
MCV: 91.7 fL (ref 80.0–100.0)
Monocytes Absolute: 0.4 10*3/uL (ref 0.1–1.0)
Monocytes Relative: 6 %
Neutro Abs: 4.4 10*3/uL (ref 1.7–7.7)
Neutrophils Relative %: 66 %
Platelets: 262 10*3/uL (ref 150–400)
RBC: 4.57 MIL/uL (ref 3.87–5.11)
RDW: 11.9 % (ref 11.5–15.5)
WBC: 6.7 10*3/uL (ref 4.0–10.5)
nRBC: 0 % (ref 0.0–0.2)

## 2021-08-16 LAB — RESP PANEL BY RT-PCR (FLU A&B, COVID) ARPGX2
Influenza A by PCR: NEGATIVE
Influenza B by PCR: NEGATIVE
SARS Coronavirus 2 by RT PCR: NEGATIVE

## 2021-08-16 LAB — CBG MONITORING, ED: Glucose-Capillary: 100 mg/dL — ABNORMAL HIGH (ref 70–99)

## 2021-08-16 LAB — TSH: TSH: 1.004 u[IU]/mL (ref 0.350–4.500)

## 2021-08-16 MED ORDER — POLYETHYLENE GLYCOL 3350 17 G PO PACK: 17.0000 g | PACK | Freq: Every day | ORAL | Status: DC | PRN

## 2021-08-16 MED ORDER — ACETAMINOPHEN 325 MG PO TABS: 650.0000 mg | ORAL_TABLET | Freq: Four times a day (QID) | ORAL | Status: DC | PRN

## 2021-08-16 MED ORDER — TETRACAINE HCL 0.5 % OP SOLN
1.0000 [drp] | Freq: Once | OPHTHALMIC | Status: AC
  Administered 2021-08-16: 2 [drp] via OPHTHALMIC
  Filled 2021-08-16: qty 4

## 2021-08-16 MED ORDER — ONDANSETRON HCL 4 MG PO TABS: 4.0000 mg | ORAL_TABLET | Freq: Four times a day (QID) | ORAL | Status: DC | PRN

## 2021-08-16 MED ORDER — DOCUSATE SODIUM 100 MG PO CAPS
100.0000 mg | ORAL_CAPSULE | Freq: Two times a day (BID) | ORAL | Status: DC
  Administered 2021-08-16 – 2021-08-20 (×9): 100 mg via ORAL
  Filled 2021-08-16 (×9): qty 1

## 2021-08-16 MED ORDER — HYDROCORTISONE SOD SUC (PF) 100 MG IJ SOLR
100.0000 mg | Freq: Once | INTRAMUSCULAR | Status: AC
  Administered 2021-08-16: 100 mg via INTRAVENOUS
  Filled 2021-08-16: qty 2

## 2021-08-16 MED ORDER — OXYCODONE HCL 5 MG PO TABS: 5.0000 mg | ORAL_TABLET | ORAL | Status: DC | PRN

## 2021-08-16 MED ORDER — IOHEXOL 350 MG/ML SOLN
75.0000 mL | Freq: Once | INTRAVENOUS | Status: AC | PRN
  Administered 2021-08-16: 75 mL via INTRAVENOUS

## 2021-08-16 MED ORDER — MIDODRINE HCL 5 MG PO TABS
10.0000 mg | ORAL_TABLET | Freq: Three times a day (TID) | ORAL | Status: DC
  Administered 2021-08-16 – 2021-08-20 (×13): 10 mg via ORAL
  Filled 2021-08-16 (×15): qty 2

## 2021-08-16 MED ORDER — ENOXAPARIN SODIUM 40 MG/0.4ML IJ SOSY
40.0000 mg | PREFILLED_SYRINGE | INTRAMUSCULAR | Status: DC
  Administered 2021-08-16 – 2021-08-20 (×5): 40 mg via SUBCUTANEOUS
  Filled 2021-08-16 (×5): qty 0.4

## 2021-08-16 MED ORDER — INSULIN ASPART 100 UNIT/ML IJ SOLN
0.0000 [IU] | Freq: Every day | INTRAMUSCULAR | Status: DC
  Administered 2021-08-16: 5 [IU] via SUBCUTANEOUS
  Administered 2021-08-17: 3 [IU] via SUBCUTANEOUS

## 2021-08-16 MED ORDER — ACETAMINOPHEN 650 MG RE SUPP: 650.0000 mg | Freq: Four times a day (QID) | RECTAL | Status: DC | PRN

## 2021-08-16 MED ORDER — SODIUM CHLORIDE 0.9 % IV BOLUS
1000.0000 mL | Freq: Once | INTRAVENOUS | Status: AC
  Administered 2021-08-16: 1000 mL via INTRAVENOUS

## 2021-08-16 MED ORDER — LACTATED RINGERS IV SOLN: INTRAVENOUS | Status: DC

## 2021-08-16 MED ORDER — INSULIN ASPART 100 UNIT/ML IJ SOLN
0.0000 [IU] | Freq: Three times a day (TID) | INTRAMUSCULAR | Status: DC
  Administered 2021-08-16 (×2): 15 [IU] via SUBCUTANEOUS
  Administered 2021-08-17: 8 [IU] via SUBCUTANEOUS
  Administered 2021-08-17: 5 [IU] via SUBCUTANEOUS
  Administered 2021-08-17: 3 [IU] via SUBCUTANEOUS
  Administered 2021-08-18 (×2): 5 [IU] via SUBCUTANEOUS
  Administered 2021-08-18 – 2021-08-19 (×2): 3 [IU] via SUBCUTANEOUS
  Administered 2021-08-19: 2 [IU] via SUBCUTANEOUS
  Administered 2021-08-19: 3 [IU] via SUBCUTANEOUS
  Administered 2021-08-20: 2 [IU] via SUBCUTANEOUS

## 2021-08-16 MED ORDER — DULOXETINE HCL 60 MG PO CPEP
60.0000 mg | ORAL_CAPSULE | Freq: Two times a day (BID) | ORAL | Status: DC
  Administered 2021-08-16 – 2021-08-20 (×9): 60 mg via ORAL
  Filled 2021-08-16 (×9): qty 1

## 2021-08-16 MED ORDER — SODIUM CHLORIDE 0.9% FLUSH
3.0000 mL | Freq: Two times a day (BID) | INTRAVENOUS | Status: DC
  Administered 2021-08-16 – 2021-08-20 (×9): 3 mL via INTRAVENOUS

## 2021-08-16 MED ORDER — BISACODYL 5 MG PO TBEC: 5.0000 mg | DELAYED_RELEASE_TABLET | Freq: Every day | ORAL | Status: DC | PRN

## 2021-08-16 MED ORDER — INSULIN GLARGINE 100 UNIT/ML SOLOSTAR PEN: 50.0000 [IU] | PEN_INJECTOR | Freq: Every day | SUBCUTANEOUS | Status: DC

## 2021-08-16 MED ORDER — ONDANSETRON HCL 4 MG/2ML IJ SOLN: 4.0000 mg | Freq: Four times a day (QID) | INTRAMUSCULAR | Status: DC | PRN

## 2021-08-16 MED ORDER — GABAPENTIN 300 MG PO CAPS
600.0000 mg | ORAL_CAPSULE | Freq: Two times a day (BID) | ORAL | Status: DC
  Administered 2021-08-16 – 2021-08-20 (×10): 600 mg via ORAL
  Filled 2021-08-16 (×10): qty 2

## 2021-08-16 MED ORDER — GABAPENTIN 300 MG PO CAPS
1200.0000 mg | ORAL_CAPSULE | Freq: Every day | ORAL | Status: DC
  Administered 2021-08-16 – 2021-08-19 (×4): 1200 mg via ORAL
  Filled 2021-08-16 (×4): qty 4

## 2021-08-16 MED ORDER — GABAPENTIN 600 MG PO TABS: 600.0000 mg | ORAL_TABLET | Freq: Three times a day (TID) | ORAL | Status: DC

## 2021-08-16 MED ORDER — INSULIN GLARGINE-YFGN 100 UNIT/ML ~~LOC~~ SOLN
50.0000 [IU] | Freq: Every day | SUBCUTANEOUS | Status: DC
  Administered 2021-08-16 – 2021-08-17 (×2): 50 [IU] via SUBCUTANEOUS
  Filled 2021-08-16 (×3): qty 0.5

## 2021-08-16 MED ORDER — FLUORESCEIN SODIUM 1 MG OP STRP
ORAL_STRIP | OPHTHALMIC | Status: AC
  Administered 2021-08-16: 1 via OPHTHALMIC
  Filled 2021-08-16: qty 1

## 2021-08-16 NOTE — Consult Note (Signed)
Chief Complaint/Reason for Consultation:   HPI: 61yo F with history DM2 and adrenal insufficiency being admitted for symptomatic orthostatic hypotension and presenting with c/o dizziness and bilateral blurry vision, worse on the right, and worse in the peripheral vision. Patient feels this started suddenly 3 days ago. She denies any new or increase in flashes or floaters, though does see does note seeing flashes and floaters periodically OU. She denies any eye pain or irritation and denies any discomfort in her eyes. She states she uses reading glasses, but does not have any distance glasses, and she has not had an eye exam in a least 5 years. Denies double vision or headache.   ROS: +dizziness, lightheadeadness, otherwise as in HPI  No current facility-administered medications on file prior to encounter.   Current Outpatient Medications on File Prior to Encounter  Medication Sig Dispense Refill   acetaminophen (TYLENOL) 500 MG tablet Take 2 tablets (1,000 mg total) by mouth every 6 (six) hours as needed for mild pain or headache (pain).  0   DULoxetine (CYMBALTA) 60 MG capsule Take 1 capsule (60 mg total) by mouth 2 (two) times daily. 60 capsule 3   gabapentin (NEURONTIN) 600 MG tablet TAKE ONE TABLET (600 MG) BY MOUTH EVERY MORNING AND AFTERNOON, TAKE TWO TABLETS AT NIGHT (Patient taking differently: Take 600 mg by mouth See admin instructions. TAKE ONE TABLET (600 MG) BY MOUTH EVERY MORNING AND AFTERNOON, TAKE TWO TABLETS AT NIGHT) 120 tablet 3   HUMALOG KWIKPEN 100 UNIT/ML KwikPen Inject 0-12 Units into the skin 3 (three) times daily. Per sliding scale 30 mL 3   insulin glargine (LANTUS SOLOSTAR) 100 UNIT/ML Solostar Pen Inject 50 Units into the skin at bedtime. 30 mL 3   midodrine (PROAMATINE) 10 MG tablet Take 1 tablet (10 mg total) by mouth 3 (three) times daily with meals. 90 tablet 3   tiZANidine (ZANAFLEX) 4 MG tablet Take 4 mg by mouth every 6 (six) hours as needed for muscle spasms.      Accu-Chek Softclix Lancets lancets Use as instructed tid before meals 100 each 12   Blood Glucose Monitoring Suppl (ACCU-CHEK GUIDE) w/Device KIT Use as directed tid 1 kit 0   glucose blood (ACCU-CHEK GUIDE) test strip Use as instructed tid 100 each 12   guaiFENesin-dextromethorphan (ROBITUSSIN DM) 100-10 MG/5ML syrup Take 5 mLs by mouth every 4 (four) hours as needed for cough (chest congestion). (Patient not taking: Reported on 08/16/2021) 118 mL 0   Insulin Pen Needle (PEN NEEDLES) 31G X 8 MM MISC Use as directed 100 each 6    Allergies  Allergen Reactions   Tramadol Nausea And Vomiting    REACTION: Projectile vomiting    Patient Active Problem List   Diagnosis Date Noted   Visual field constriction of right eye 08/16/2021   DNR (do not resuscitate) 08/16/2021   Current severe episode of major depressive disorder without psychotic features ([place]) 09/26/2020   DKA (diabetic ketoacidosis) ([place]) 07/29/2020   Dehydration    Hyperglycemia    Preoperative clearance 02/25/2020   Fracture of medial malleolus, right, closed 02/24/2020   Hyperglycemia due to type 2 diabetes mellitus ([place]) 02/24/2020   Autonomic dysfunction 02/24/2020   Fall 02/24/2020   Adrenal insufficiency ([place]) 09/07/2019   Recurrent syncope 09/06/2019   Sinus tachycardia 09/06/2019   Hypokalemia 09/06/2019   Pulmonary nodules 09/06/2019   Orthostatic hypotension    Syncope and collapse 06/30/2019   Hypotension 06/29/2019   Chronic pain syndrome 08/24/2017   Diabetic  polyneuropathy associated with type 2 diabetes mellitus ([place]) 08/24/2017   Restless leg syndrome 08/24/2017   Vertigo 04/09/2016   Acute sinusitis 06/27/2015   Cervical disc disorder with radiculopathy of cervical region 02/20/2015   Neck muscle spasm 10/23/2014   Healthcare maintenance 10/23/2014   Depression 11/25/2013   Uncontrolled type 2 diabetes mellitus with hyperglycemia, with long-term current use of insulin ([place])      EXAMINATION  VAsc  (near with +2.5D lens): OD: 20/100+2  , PHNI OS: 20/200  , PH 20/60-2  Pupils:  OD: Equal, round, reactive, no APD OS: Equal, round, reactive, no APD  T(Pen): OD:  15  mm Hg OS:  13 mm Hg  CVF: OD constricted temporally, inferior, superior, OS constricted nasally  EOM: full OU, no limitation or restriction OU  Slit lamp Exam: Ext/Lids: normal OU Conj/Sclera: white and quiet OU Cornea: clear OU, no abrasion or infiltrate OU AC: Deep and Quiet OU Iris: Round and Flat OU Lens: 2-3 NS OU and 1-2+ PSC OU  Dilated OU with phenylephrine and tropicamide OU @ 949am  Dilated Fundus Exam: Vitreous: Clear OU, no vitreous hemorrhages OU Disc: sharp and pink with 0.3 c/d OU, no pallor or edema OU, no NVD or disc hemorrhages OU Macula: OD few microaneurysms and dot blot hemorrhages, attached, OS attached, small cotton wool spot inferior to macula. No cherry red spot OU Vessels: moderate narrowing OU, perfused OU Periphery: flat and attached 360 without breaks or tears OU, scattered microaneurysms/dot blot hemes in 1-2 quadrants OU   Labs/Imaging:  CTA head and neck IMPRESSION: 1. 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 2. No emergent vascular finding. 3. Cervical carotid atherosclerosis with up to 35% ICA narrowing on the left.     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 Ophthalmology [facility]

## 2021-08-16 NOTE — ED Notes (Signed)
Orthostatic vitals are complete.Pt complains of right eye pain with blurred vision and some dizziness after standing. Nurse and MD made aware of drop in the vital signs.

## 2021-08-16 NOTE — Progress Notes (Signed)
°  Transition of Care (TOC) Screening Note   Patient Details  Name: [name] [name] Date of Birth: 1961-07-20   Transition of Care [facility] CM/SW Contact:    Alfredia Ferguson, LCSW Phone Number: 08/16/2021, 9:01 AM    Transition of Care Department [facility]) has reviewed patient and no TOC needs have been identified at this time pending continued medical work-up and evaluation. We will continue to monitor patient advancement through interdisciplinary progression rounds.

## 2021-08-16 NOTE — Assessment & Plan Note (Signed)
-  She provides an inconsistent history and so it is difficult to determine the extent of the visual field deficit -On exam, it appears to be primarily blurry vision/loss of vision in the RUO quadrant, but this is not entirely clear -She was discussed with Dr. Eulas Post overnight with plan for outpatient ophthalmology evaluation but I have spoken with him this AM and he will see the patient and consult in the hospital today -MRI pending, as above; the CTA, while not completely normal, does not provide an explanation for her symptoms

## 2021-08-16 NOTE — Evaluation (Signed)
Speech Language Pathology Evaluation Patient Details Name: [name] [name] MRN: [id number] DOB: 1961/06/14 Today's Date: 08/16/2021 Time: 6301-6010 SLP Time Calculation (min) (ACUTE ONLY): 20 min  Problem List:  Patient Active Problem List   Diagnosis Date Noted   Visual field constriction of right eye 08/16/2021   DNR (do not resuscitate) 08/16/2021   Current severe episode of major depressive disorder without psychotic features ([place]) 09/26/2020   DKA (diabetic ketoacidosis) ([place]) 07/29/2020   Dehydration    Hyperglycemia    Preoperative clearance 02/25/2020   Fracture of medial malleolus, right, closed 02/24/2020   Hyperglycemia due to type 2 diabetes mellitus ([place]) 02/24/2020   Autonomic dysfunction 02/24/2020   Fall 02/24/2020   Adrenal insufficiency ([place]) 09/07/2019   Recurrent syncope 09/06/2019   Sinus tachycardia 09/06/2019   Hypokalemia 09/06/2019   Pulmonary nodules 09/06/2019   Orthostatic hypotension    Syncope and collapse 06/30/2019   Hypotension 06/29/2019   Chronic pain syndrome 08/24/2017   Diabetic polyneuropathy associated with type 2 diabetes mellitus ([place]) 08/24/2017   Restless leg syndrome 08/24/2017   Vertigo 04/09/2016   Acute sinusitis 06/27/2015   Cervical disc disorder with radiculopathy of cervical region 02/20/2015   Neck muscle spasm 10/23/2014   Healthcare maintenance 10/23/2014   Depression 11/25/2013   Uncontrolled type 2 diabetes mellitus with hyperglycemia, with long-term current use of insulin (HCC)    Past Medical History:  Past Medical History:  Diagnosis Date   Cervical disc disorder with radiculopathy of cervical region    Chronic pain syndrome    Degenerative joint disease    Depression dx 1997   Diabetes mellitus, type 2 ([place]) 2011   No insulin   Diabetic polyneuropathy (HCC)    GERD (gastroesophageal reflux disease)    Glaucoma    Headache(784.0)    Hypertension    Lab 11/2011:  CXR, EKG, CBC, TSH, BMet, troponin-normal;  lipid profile: 188, 131, 36, 126    Lumbar herniated disc    Non-compliance    Pancreatic cyst    Endoscopic aspiration in 09/2009   Pneumonia 08/02/2012   Shingles    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 MRI pending. PMH includes: chronic pain, uncontrolled DM, adrenal insufficiency, orthostatic hypotension, GERD   Assessment / Plan / Recommendation Clinical Impression  Pt was administered the SLUMS, although visual portions of the test were omitted due pt report of blurred vision. Out of the adjusted total, pt scored 8/24. She demonstrated slow processing and intermittently demonstrated reduced recall and selective attention. She had significant difficulty with immediate recall and only got 1/5 words during delayed recall task, but she was able to remember several specific details from paragraph level comprehension. She acknowledged that these tasks were hard, and at times just wanted to move on to the next question. She also shares that this feels like a significant change from her basleine. Recommend SLP acutely and at next level of care to maximize cognition and safety.    SLP Assessment  SLP  Recommendation/Assessment: Patient needs continued Speech Lanaguage Pathology Services SLP Visit Diagnosis: Cognitive communication deficit [phone])    Recommendations for follow up therapy are one component of a multi-disciplinary discharge planning process, led by the attending physician.  Recommendations may be updated based on patient status, additional  functional criteria and insurance authorization.    Follow Up Recommendations  Acute inpatient rehab (3hours/day)    Assistance Recommended at Discharge  Frequent or constant Supervision/Assistance  Functional Status Assessment Patient has had a recent decline in their functional status and demonstrates the ability to make significant improvements in function in a reasonable and predictable amount of time.  Frequency and Duration min 2x/week  2 weeks      SLP Evaluation Cognition  Overall Cognitive Status: Impaired/Different from baseline Arousal/Alertness: Awake/alert Orientation Level: Oriented X4 Attention: Selective Selective Attention: Impaired Selective Attention Impairment: Verbal basic Memory: Impaired Memory Impairment: Storage deficit;Retrieval deficit;Decreased recall of new information Problem Solving: Impaired Problem Solving Impairment: Verbal complex       Comprehension  Auditory Comprehension Overall Auditory Comprehension: Impaired Conversation: Complex Interfering Components: Attention;Processing speed;Working memory    Expression Expression Primary Mode of Expression: Verbal Verbal Expression Overall Verbal Expression: Appears within functional limits for tasks assessed   Oral / Motor  Motor Speech Overall Motor Speech: Appears within functional limits for tasks assessed            Osie Bond., M.A. [facility] Pager [phone] Office [phone]   08/16/2021, 3:08 PM

## 2021-08-16 NOTE — Progress Notes (Signed)
OT Cancellation Note  Patient Details Name: [name] [name] MRN: [id number] DOB: 25-Jun-1961   Cancelled Treatment:    Reason Eval/Treat Not Completed: Patient at procedure or test/ unavailable;Fatigue/lethargy limiting ability to participate. Ophthalmology currently is seeing pt and eyes are dilated, and RN reports pt has been very lethargic this am sleeping most of the morning.  Will reattempt.   Nilsa Nutting., OTR/L Acute Rehabilitation Services Pager [phone] Office [phone]   Lucille Passy M 08/16/2021, 10:18 AM

## 2021-08-16 NOTE — Assessment & Plan Note (Addendum)
-  Prior A1c was >15.5, indicating very poor control -Glucose this AM is 105 -Continue Lantus -Will cover with moderate-scale SSI -Consult diabetes coordinator

## 2021-08-16 NOTE — Progress Notes (Signed)
Carryover admit to the Day Admitter.  I discussed this patient's case with the EDP, Kennith Maes, PA.  Per these discussions:  This is a 61 year old female with history of adrenal insufficiency, who is being admitted for persistent symptomatic orthostatic hypotension after presenting to ED with 3 days of dizziness, lightheadedness, and blurry vision in bilateral eyes, worse in peripheral visual fields.  Patient with reported several episodes of presyncope over the last 3 days, worse with positional changes, but has not actually lost consciousness over that time.  This has been associated with new onset blurry vision over similar timeframe, without any preceding trauma.  In the ED, orthostatic vital signs positive, with systolic blood pressure reportedly dropping into the 60s mmHg when moving from a supine to seated position , and associated with worsening presyncope.  Orthostatic vital signs remain positive for orthostatic hypotension in spite of 2 L of administered IV fluids in the ED this evening, with the patient continuing to report dizziness.   In the context of her bilateral blurry vision, worse in bilateral peripheral visual fields, the patient's case was discussed with the on-call ophthalmologist, Dr. Eulas Post , who recommended outpatient follow-up in his ophthalmology clinic, but did not feel that there were any urgent/emergent ophthalmologic issues that needed to be attended to during this ED/hospital course.   CT head as well as CTA head and neck reportedly showed age-indeterminate lacunar infarct of unclear significance, without evidence of large vessel occlusion or flow-limiting stenosis, within the context of 3-day duration of the above symptoms.   Of note, in the setting of a history of adrenal insufficiency and persistently positive orthostatic vital signs, the patient also received a dose of IV hydrocortisone in the ED this evening.  I placed an order for observation to med telemetry  unit for further evaluation and management of persistent symptomatic orthostatic hypotension.  I also placed some preliminary admission orders by completing the adult multi morbid admission order set, and ordered Q4 hour neurochecks. Of note, I ordered current code status to be "Full". However, per my preliminary chart review, it appears that during most recent prior hospitalization, her CODE STATUS was noted to be DNR.  Consequently, patient's wishes regarding CODE STATUS will need to be further clarified.    Babs Bertin, DO Hospitalist

## 2021-08-16 NOTE — ED Provider Notes (Signed)
[facility] EMERGENCY DEPARTMENT Provider Note   CSN: [id number] Arrival date & time: 08/16/21  0118     History  Chief Complaint  Patient presents with   Dizziness   Blurred Vision    [name] [name] is a 61 y.o. female with a hx of T2DM, hypertension, vertigo, adrenal insufficiency, and hypotension who presents to the ED with complaints of progressively worsening blurry vision x 3 days. Patient reports she is having blurry vision bilaterally, R>L. She states the blurry vision got more prominent in her right eye in her periphery, there seems to be a point that her vision changes as she looks medially- this remains blurry but not as much as laterally. Left eye has developed a similar pattern (peripheral more blurry than medial). With the blurry vision she tries to focus and subsequently gets dizzy like she might pass out. She also gets dizzy with position changes. She states it is dizzy like she is going to pass out, not dizzy like the room spinning. No hx of same w/ vision. Does not wear contact lenses/glasses. Denies numbness, focal weakness, syncope, loss of vision, diplopia, nausea, vomiting, nasal congestion, or speech change   HPI     Home Medications Prior to Admission medications   Medication Sig Start Date End Date Taking? Authorizing Provider  Accu-Chek Softclix Lancets lancets Use as instructed tid before meals 06/24/21   Charlott Rakes, MD  acetaminophen (TYLENOL) 500 MG tablet Take 2 tablets (1,000 mg total) by mouth every 6 (six) hours as needed for mild pain or headache (pain). 07/02/19   Domenic Polite, MD  Blood Glucose Monitoring Suppl (ACCU-CHEK GUIDE) w/Device KIT Use as directed tid 06/24/21   Charlott Rakes, MD  DULoxetine (CYMBALTA) 60 MG capsule Take 1 capsule (60 mg total) by mouth 2 (two) times daily. 06/24/21 06/24/22  Charlott Rakes, MD  gabapentin (NEURONTIN) 600 MG tablet TAKE ONE TABLET (600 MG) BY MOUTH EVERY MORNING AND AFTERNOON, TAKE TWO  TABLETS AT NIGHT 07/28/21   Charlott Rakes, MD  glucose blood (ACCU-CHEK GUIDE) test strip Use as instructed tid 06/24/21   Charlott Rakes, MD  guaiFENesin-dextromethorphan (ROBITUSSIN DM) 100-10 MG/5ML syrup Take 5 mLs by mouth every 4 (four) hours as needed for cough (chest congestion). 06/24/21   Charlott Rakes, MD  HUMALOG KWIKPEN 100 UNIT/ML KwikPen Inject 0-12 Units into the skin 3 (three) times daily. Per sliding scale 06/19/21   Pokhrel, Corrie Mckusick, MD  insulin glargine (LANTUS SOLOSTAR) 100 UNIT/ML Solostar Pen Inject 50 Units into the skin at bedtime. 06/19/21   Pokhrel, Corrie Mckusick, MD  Insulin Pen Needle (PEN NEEDLES) 31G X 8 MM MISC Use as directed 08/12/20   Ghimire, Henreitta Leber, MD  midodrine (PROAMATINE) 10 MG tablet Take 1 tablet (10 mg total) by mouth 3 (three) times daily with meals. Patient not taking: Reported on 06/17/2021 11/25/20 11/25/21  Charlott Rakes, MD  Multiple Vitamins-Minerals (CENTRUM SILVER 50+MEN) TABS Take 1 tablet by mouth daily. Patient not taking: Reported on 11/25/2020    [provider]  tiZANidine (ZANAFLEX) 4 MG tablet Take 1 tablet (4 mg total) by mouth every 8 (eight) hours as needed for muscle spasms. Patient not taking: Reported on 06/17/2021 11/25/20   Charlott Rakes, MD      Allergies    Tramadol    Review of Systems   Review of Systems  Constitutional:  Positive for fatigue. Negative for fever.  HENT:  Negative for congestion.   Eyes:  Positive for visual disturbance. Negative  for discharge.  Respiratory:  Negative for shortness of breath.   Cardiovascular:  Negative for chest pain.  Gastrointestinal:  Negative for abdominal pain, nausea and vomiting.  Neurological:  Positive for dizziness. Negative for seizures, syncope, speech difficulty, weakness (focal) and numbness.  All other systems reviewed and are negative.  Physical Exam Updated Vital Signs BP 124/72    Pulse (!) 123    Temp 97.6 F (36.4 C) (Oral)    Resp 15    Ht 5' 6" (1.676 m)     SpO2 94%    BMI 22.63 kg/m  Physical Exam Vitals and nursing note reviewed.  Constitutional:      General: She is not in acute distress.    Appearance: She is well-developed. She is not toxic-appearing.  HENT:     Head: Normocephalic and atraumatic.  Eyes:     General: Vision grossly intact. Gaze aligned appropriately.        Right eye: No discharge.        Left eye: No discharge.     Extraocular Movements: Extraocular movements intact.     Right eye: Nystagmus present.     Left eye: Nystagmus present.     Conjunctiva/sclera: Conjunctivae normal.     Comments: PERRL. No proptosis. No periorbital edema/erythema.  Fluorescein stain without significant uptake, no evidence of dendritic staining, ulceration, or abrasion at this time. Left eye IOP: 16 Right IOP: 17 Visual field decreased to right upper and lower quadrants.    Cardiovascular:     Rate and Rhythm: Regular rhythm. Tachycardia present.  Pulmonary:     Effort: Pulmonary effort is normal. No respiratory distress.     Breath sounds: Normal breath sounds. No wheezing, rhonchi or rales.  Abdominal:     General: There is no distension.     Palpations: Abdomen is soft.     Tenderness: There is no abdominal tenderness.  Musculoskeletal:     Cervical back: Normal range of motion and neck supple. No rigidity.  Skin:    General: Skin is warm and dry.     Findings: No rash.  Neurological:     Comments: Alert. Clear speech. No facial droop. CNIII-XII grossly intact. Bilateral upper and lower extremities' sensation grossly intact. 5/5 symmetric strength with grip strength and with plantar and dorsi flexion bilaterally . Negative pronator drift.   Psychiatric:        Behavior: Behavior normal.    ED Results / Procedures / Treatments   Labs (all labs ordered are listed, but only abnormal results are displayed) Labs Reviewed  COMPREHENSIVE METABOLIC PANEL - Abnormal; Notable for the following components:      Result Value    Glucose, Bld 105 (*)    Albumin 3.4 (*)    All other components within normal limits  CBG MONITORING, ED - Abnormal; Notable for the following components:   Glucose-Capillary 100 (*)    All other components within normal limits  RESP PANEL BY RT-PCR (FLU A&B, COVID) ARPGX2  CBC WITH DIFFERENTIAL/PLATELET  TSH  URINALYSIS, ROUTINE W REFLEX MICROSCOPIC    EKG None  Radiology CT ANGIO HEAD NECK W WO CM  Result Date: 08/16/2021 CLINICAL DATA:  Dizziness, loss of balance, and lethargy EXAM: CT ANGIOGRAPHY HEAD AND NECK TECHNIQUE: Multidetector CT imaging of the head and neck was performed using the standard protocol during bolus administration of intravenous contrast. Multiplanar CT image reconstructions and MIPs were obtained to evaluate the vascular anatomy. Carotid stenosis measurements (when applicable) are obtained  utilizing NASCET criteria, using the distal internal carotid diameter as the denominator. RADIATION DOSE REDUCTION: This exam was performed according to the departmental dose-optimization program which includes automated exposure control, adjustment of the mA and/or kV according to patient size and/or use of iterative reconstruction technique. CONTRAST:  32m OMNIPAQUE IOHEXOL 350 MG/ML SOLN COMPARISON:  None. FINDINGS: CT HEAD FINDINGS Brain: Intermediate density at the left caudate body and upper thalamus. No hemorrhage, hydrocephalus, or masslike finding. Vascular: See below Skull: No acute or aggressive finding. Periapical erosion around minimally covered right upper teeth. Sinuses: Negative Orbits: Negative Review of the MIP images confirms the above findings CTA NECK FINDINGS Aortic arch: Unremarkable where covered.  Three vessel branching. Right carotid system: Calcified plaque at the posterior wall of the right ICA. No significant stenosis and no ulceration. Left carotid system: Mixed density plaque the bifurcation and proximal ICA. Proximal ICA stenosis measures 35%. No  ulceration or beading. Vertebral arteries: No proximal subclavian stenosis or irregularity. Codominant vertebral arteries which are smoothly contoured and widely patent to the dura. Skeleton: Ordinary cervical spine degeneration. Other neck: No acute or aggressive finding. Subcentimeter right thyroid nodule. No followup recommended (ref: J Am Coll Radiol. 2015 Feb;12(2): 143-50). Upper chest: Negative Review of the MIP images confirms the above findings CTA HEAD FINDINGS Anterior circulation: No significant stenosis, proximal occlusion, aneurysm, or vascular malformation. Posterior circulation: No significant stenosis, proximal occlusion, aneurysm, or vascular malformation. Venous sinuses: As permitted by contrast timing, patent. Anatomic variants: None Review of the MIP images confirms the above findings IMPRESSION: 1. 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 2. No emergent vascular finding. 3. Cervical carotid atherosclerosis with up to 35% ICA narrowing on the left. Electronically Signed   By: JJorje GuildM.D.   On: 08/16/2021 05:29    Procedures Procedures    Medications Ordered in ED Medications  acetaminophen (TYLENOL) tablet 650 mg (has no administration in time range)    Or  acetaminophen (TYLENOL) suppository 650 mg (has no administration in time range)  sodium chloride 0.9 % bolus 1,000 mL (0 mLs Intravenous Stopped 08/16/21 0434)  sodium chloride 0.9 % bolus 1,000 mL (1,000 mLs Intravenous New Bag/Given 08/16/21 0509)  iohexol (OMNIPAQUE) 350 MG/ML injection 75 mL (75 mLs Intravenous Contrast Given 08/16/21 0451)  hydrocortisone sodium succinate (SOLU-CORTEF) 100 MG injection 100 mg (100 mg Intravenous Given 08/16/21 0541)  tetracaine (PONTOCAINE) 0.5 % ophthalmic solution 1-2 drop (2 drops Both Eyes Given 08/16/21 0600)  fluorescein 1 MG ophthalmic strip (1 strip Both Eyes Given 08/16/21 0608)    ED Course/ Medical Decision Making/ A&P                            Medical Decision Making Amount and/or Complexity of Data Reviewed Labs: ordered. Radiology: ordered.  Risk Prescription drug management. Decision regarding hospitalization.   Patient presents to the ED with complaints of blurry vision & dizziness, this involves an extensive number of treatment options, and is a complaint that carries with it a high risk of complications and morbidity. Nontoxic, vitals with tachycardia. Given patient's visual complaints/findings- Case was discussed with ophthalmologist Dr. GEulas Postby attending Dr. MDayna Barker relays if overall reassuring ED evaluation with CTA will need outpatient follow-up for this. Other than her visual changes she has no focal neuro deficits.   Additional history obtained:  Additional history obtained from chart review & nursing note review. Additionally per EMS gave 500 cc of fluid en  route.  EKG: Sinus tachycardia   Cardiac Monitoring:  The patient was maintained on a cardiac monitor.  I personally viewed and interpreted the cardiac monitored which showed an underlying rhythm of: Sinus tachycardia  Lab Tests:  I Ordered, reviewed, and interpreted labs, pertinent results include:  CBC: Unremarkable CMP: Mildly low albumin, no significant electrolyte derangement, renal function is preserved TSH: Within normal limits COVID/influenza testing: Negative  Imaging Studies ordered:  I ordered imaging studies which included CT angio head/neck,  I independently reviewed & interpreted imaging & am in agreement with radiology impression which shows: 1. 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 2. No emergent vascular finding. 3. Cervical carotid atherosclerosis with up to 35% ICA narrowing on the left.   ED Course:  Labs and imaging overall reassuring. Patient with significant orthostatic hypotension, additional fluids ordered  Orthostatic VS for the past 24 hrs:  BP- Lying Pulse- Lying BP- Sitting Pulse- Sitting BP- Standing  at 0 minutes Pulse- Standing at 0 minutes  08/16/21 0230 124/81 114 93/53 119 (!) 66/43 120   Following second liter of fluids patient remains tachycardic in the upper 120s, she does have a history of tachycardia, but is typically below 120.  Trialed IV hydrocortisone given her history of adrenal insufficiency, no significant change in clinical status.  She remains dizzy at times with blurred vision.  In terms of her vision complaints, she does have decreased visual acuity and trouble with her right outer peripheral visual fields.  No uptake with fluorescein stain.  Intraocular pressures are within normal limits.  CT angio was unremarkable.  No laboratory changes presenting underlying pathology at this time. With patient's persistent symptoms and tachycardia will discuss with medicine for admission.  06:25: CONSULT: Discussed with hospitalist Dr. Velia Meyer- accepts admission.   This is a shared visit with supervising physician Dr. Dayna Barker who has independently evaluated patient & provided guidance in evaluation/management/disposition, in agreement with care   Portions of this note were generated with Dragon dictation software. Dictation errors may occur despite best attempts at proofreading.   Final Clinical Impression(s) / ED Diagnoses Final diagnoses:  Orthostatic hypotension  Tachycardia  Dizziness  Blurry vision    Rx / DC Orders ED Discharge Orders     None         Amaryllis Dyke, PA-C 08/16/21 0645    Mesner, Corene Cornea, MD 08/16/21 0700

## 2021-08-16 NOTE — ED Notes (Signed)
Tanda Rockers daughter [phone] requesting an update on the patient

## 2021-08-16 NOTE — Assessment & Plan Note (Signed)
-  Continue Cymbalta -She has a very flat affect this AM, but it could be related to somnolence from spending all night in the ER -Consider inpatient psychiatry consult if ongoing concerns once the patient is more awake/alert/interactive

## 2021-08-16 NOTE — ED Notes (Signed)
Patient transported to CT 

## 2021-08-16 NOTE — Assessment & Plan Note (Addendum)
-  Patient with prior h/o autonomic dysfunction/adrenal insufficiency presenting with dizziness, found to be orthostatic -She was previously prescribed midodrine and did not pick up the rx -Will observe on telemetry -Start midodrine -Of note, also with reported visual field defect (see below) and age-indeterminate lacunar infarct on CTA so will order MRI; if abnormal, will consult neurology -She was given a dose of steroids in the ER but does not appear to be on chronic steroids; florinef may be a consideration -She was also referred to endocrinology as an outpatient but it is not clear if she has been seen by them -PT/OT/ST (cognitive) consults requested, as well as nutrition and TOC team

## 2021-08-16 NOTE — ED Notes (Signed)
Pt continuing to report R eye blurriness

## 2021-08-16 NOTE — Assessment & Plan Note (Signed)
-  I have discussed code status with the patient and the patient would not desire resuscitation and would prefer to die a natural death should that situation arise. -She will need a gold out of facility DNR form at the time of discharge

## 2021-08-16 NOTE — H&P (Signed)
History and Physical    [name] [name]:[id number] DOB: 1961/05/03 DOA: 08/16/2021  PCP: Orland Mustard  Patient coming from:  Home - lives with roommate; NOK: Tanda Rockers, [phone]  Chief Complaint: Dizziness  HPI: [name] [name] is a 61 y.o. female with medical history significant of chronic pain; uncontrolled DM (A1c >15); HTN; adrenal insufficiency; and orthostatic hypotension presenting with dizziness and blurred vision.  She reports that she "couldn't see".  Symptoms started about 4 days ago.  No dizziness or weakness.  +lightheadedness with standing.  This is not similar to prior episodes of orthostasis because her usual strategies weren't working.  She reports compliance with medications, no recent med changes.  She is fairly somnolent this AM and not particularly interactive.  She does acknowledge numbness/weakness/tingling "all over".    ED Course: Carryover, per Dr. Velia Meyer:  This is a 61 year old female with history of adrenal insufficiency, who is being admitted for persistent symptomatic orthostatic hypotension after presenting to ED with 3 days of dizziness, lightheadedness, and blurry vision in bilateral eyes, worse in peripheral visual fields.  Patient with reported several episodes of presyncope over the last 3 days, worse with positional changes, but has not actually lost consciousness over that time.  This has been associated with new onset blurry vision over similar timeframe, without any preceding trauma.  In the ED, orthostatic vital signs positive, with systolic blood pressure reportedly dropping into the 60s mmHg when moving from a supine to seated position , and associated with worsening presyncope.  Orthostatic vital signs remain positive for orthostatic hypotension in spite of 2 L of administered IV fluids in the ED this evening, with the patient continuing to report dizziness.    In the context of her bilateral blurry vision, worse in bilateral peripheral visual  fields, the patient's case was discussed with the on-call ophthalmologist, Dr. Eulas Post , who recommended outpatient follow-up in his ophthalmology clinic, but did not feel that there were any urgent/emergent ophthalmologic issues that needed to be attended to during this ED/hospital course.    CT head as well as CTA head and neck reportedly showed age-indeterminate lacunar infarct of unclear significance, without evidence of large vessel occlusion or flow-limiting stenosis, within the context of 3-day duration of the above symptoms.    Of note, in the setting of a history of adrenal insufficiency and persistently positive orthostatic vital signs, the patient also received a dose of IV hydrocortisone in the ED this evening.  Review of Systems: As per HPI; otherwise review of systems reviewed and negative.   Ambulatory Status:  Ambulates without assistance   Past Medical History:  Diagnosis Date   Cervical disc disorder with radiculopathy of cervical region    Chronic pain syndrome    Degenerative joint disease    Depression dx 1997   Diabetes mellitus, type 2 ([place]) 2011   No insulin   Diabetic polyneuropathy (HCC)    GERD (gastroesophageal reflux disease)    Glaucoma    Headache(784.0)    Hypertension    Lab 11/2011:  CXR, EKG, CBC, TSH, BMet, troponin-normal; lipid profile: 188, 131, 36, 126    Lumbar herniated disc    Non-compliance    Pancreatic cyst    Endoscopic aspiration in 09/2009   Pneumonia 08/02/2012   Shingles    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   Torn meniscus    Vertigo     Past Surgical History:  Procedure Laterality Date   ABDOMINAL HYSTERECTOMY  CESAREAN SECTION     ORIF ANKLE FRACTURE Right 02/25/2020   Procedure: OPEN REDUCTION INTERNAL FIXATION (ORIF) ANKLE FRACTURE;  Surgeon: Lovell Sheehan, MD;  Location: [facility] ORS;  Service: Orthopedics;  Laterality: Right;    Social History   Socioeconomic History   Marital status: Divorced    Spouse  name: Not on file   Number of children: Not on file   Years of education: Not on file   Highest education level: Not on file  Occupational History   Occupation: disabled  Tobacco Use   Smoking status: Former    Types: Cigarettes    Quit date: 08/03/1978    Years since quitting: 43.0   Smokeless tobacco: Never   Tobacco comments:    Smoked age [age]  Substance and Sexual Activity   Alcohol use: No    Comment: Former   Drug use: Not Currently    Types: Marijuana   Sexual activity: Yes    Birth control/protection: Surgical  Other Topics Concern   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    Social Determinants of Health   Financial Resource Strain: Not on file  Food Insecurity: Not on file  Transportation Needs: Not on file  Physical Activity: Not on file  Stress: Not on file  Social Connections: Not on file  Intimate Partner Violence: Not on file    Allergies  Allergen Reactions   Tramadol Nausea And Vomiting    REACTION: Projectile vomiting    Family History  Problem Relation Age of Onset   Depression Mother        type 1   Diabetes Mother    Renal Disease Mother    Lung cancer Father    Heart attack Brother        Died age 42 - was told due to massive MI/heart "exploded"   Heart failure Maternal Grandmother        Details not totally clear    Prior to Admission medications   Medication Sig Start Date End Date Taking? Authorizing Provider  Accu-Chek Softclix Lancets lancets Use as instructed tid before meals 06/24/21   Charlott Rakes, MD  acetaminophen (TYLENOL) 500 MG tablet Take 2 tablets (1,000 mg total) by mouth every 6 (six) hours as needed for mild pain or headache (pain). 07/02/19   Domenic Polite, MD  Blood Glucose Monitoring Suppl (ACCU-CHEK GUIDE) w/Device KIT Use as directed tid 06/24/21   Charlott Rakes, MD  DULoxetine (CYMBALTA) 60 MG capsule Take 1 capsule (60 mg total) by mouth 2 (two) times daily.  06/24/21 06/24/22  Charlott Rakes, MD  gabapentin (NEURONTIN) 600 MG tablet TAKE ONE TABLET (600 MG) BY MOUTH EVERY MORNING AND AFTERNOON, TAKE TWO TABLETS AT NIGHT 07/28/21   Charlott Rakes, MD  glucose blood (ACCU-CHEK GUIDE) test strip Use as instructed tid 06/24/21   Charlott Rakes, MD  guaiFENesin-dextromethorphan (ROBITUSSIN DM) 100-10 MG/5ML syrup Take 5 mLs by mouth every 4 (four) hours as needed for cough (chest congestion). 06/24/21   Charlott Rakes, MD  HUMALOG KWIKPEN 100 UNIT/ML KwikPen Inject 0-12 Units into the skin 3 (three) times daily. Per sliding scale 06/19/21   Pokhrel, Corrie Mckusick, MD  insulin glargine (LANTUS SOLOSTAR) 100 UNIT/ML Solostar Pen Inject 50 Units into the skin at bedtime. 06/19/21   Pokhrel, Corrie Mckusick, MD  Insulin Pen Needle (PEN NEEDLES) 31G X 8 MM MISC Use as directed 08/12/20   Ghimire, Henreitta Leber, MD  midodrine (  PROAMATINE) 10 MG tablet Take 1 tablet (10 mg total) by mouth 3 (three) times daily with meals. Patient not taking: Reported on 06/17/2021 11/25/20 11/25/21  Charlott Rakes, MD  Multiple Vitamins-Minerals (CENTRUM SILVER 50+MEN) TABS Take 1 tablet by mouth daily. Patient not taking: Reported on 11/25/2020    [provider]  tiZANidine (ZANAFLEX) 4 MG tablet Take 1 tablet (4 mg total) by mouth every 8 (eight) hours as needed for muscle spasms. Patient not taking: Reported on 06/17/2021 11/25/20   Charlott Rakes, MD    Physical Exam: Vitals:   08/16/21 0515 08/16/21 0547 08/16/21 0600 08/16/21 0615  BP: 139/80 (!) 102/59 113/72 124/72  Pulse: (!) 125 (!) 123 (!) 124 (!) 123  Resp: 19 16 (!) 21 15  Temp:      TempSrc:      SpO2: 95% 93% 95% 94%  Height:         General:  Appears calm and comfortable and is in NAD, somnolent and minimally interactive but appears capable Eyes:  PERRL, EOMI, normal lids, iris; reports R lateral upper > lower visual field loss ENT:  grossly normal hearing, lips & tongue, mmm; poor dentition Neck:  no LAD, masses or  thyromegaly Cardiovascular:  RRR, no m/r/g. No LE edema.  Respiratory:   CTA bilaterally with no wheezes/rales/rhonchi.  Normal respiratory effort. Abdomen:  soft, NT, ND Skin:  no rash or induration seen on limited exam Musculoskeletal:  grossly normal tone BUE/BLE, good ROM, no bony abnormality Psychiatric:  flat mood and affect, speech fluent and appropriate, AOx3 Neurologic:  CN 2-12 grossly intact, moves all extremities in coordinated fashion    Radiological Exams on Admission: Independently reviewed - see discussion in A/P where applicable  CT ANGIO HEAD NECK W WO CM  Result Date: 08/16/2021 CLINICAL DATA:  Dizziness, loss of balance, and lethargy EXAM: CT ANGIOGRAPHY HEAD AND NECK TECHNIQUE: Multidetector CT imaging of the head and neck was performed using the standard protocol during bolus administration of intravenous contrast. Multiplanar CT image reconstructions and MIPs were obtained to evaluate the vascular anatomy. Carotid stenosis measurements (when applicable) are obtained utilizing NASCET criteria, using the distal internal carotid diameter as the denominator. RADIATION DOSE REDUCTION: This exam was performed according to the departmental dose-optimization program which includes automated exposure control, adjustment of the mA and/or kV according to patient size and/or use of iterative reconstruction technique. CONTRAST:  73m OMNIPAQUE IOHEXOL 350 MG/ML SOLN COMPARISON:  None. FINDINGS: CT HEAD FINDINGS Brain: Intermediate density at the left caudate body and upper thalamus. No hemorrhage, hydrocephalus, or masslike finding. Vascular: See below Skull: No acute or aggressive finding. Periapical erosion around minimally covered right upper teeth. Sinuses: Negative Orbits: Negative Review of the MIP images confirms the above findings CTA NECK FINDINGS Aortic arch: Unremarkable where covered.  Three vessel branching. Right carotid system: Calcified plaque at the posterior wall of the  right ICA. No significant stenosis and no ulceration. Left carotid system: Mixed density plaque the bifurcation and proximal ICA. Proximal ICA stenosis measures 35%. No ulceration or beading. Vertebral arteries: No proximal subclavian stenosis or irregularity. Codominant vertebral arteries which are smoothly contoured and widely patent to the dura. Skeleton: Ordinary cervical spine degeneration. Other neck: No acute or aggressive finding. Subcentimeter right thyroid nodule. No followup recommended (ref: J Am Coll Radiol. 2015 Feb;12(2): 143-50). Upper chest: Negative Review of the MIP images confirms the above findings CTA HEAD FINDINGS Anterior circulation: No significant stenosis, proximal occlusion, aneurysm, or vascular malformation. Posterior circulation:  No significant stenosis, proximal occlusion, aneurysm, or vascular malformation. Venous sinuses: As permitted by contrast timing, patent. Anatomic variants: None Review of the MIP images confirms the above findings IMPRESSION: 1. 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 2. No emergent vascular finding. 3. Cervical carotid atherosclerosis with up to 35% ICA narrowing on the left. Electronically Signed   By: Jorje Guild M.D.   On: 08/16/2021 05:29    EKG: Independently reviewed.  Sinus tachycardia with rate 119; no evidence of acute ischemia   Labs on Admission: I have personally reviewed the available labs and imaging studies at the time of the admission.  Pertinent labs:   Unremarkable CMP Normal CBC TSH 1.004 COVID/flu negative   Assessment/Plan Principal Problem:   Orthostatic hypotension Active Problems:   Visual field constriction of right eye   Uncontrolled type 2 diabetes mellitus with hyperglycemia, with long-term current use of insulin (HCC)   Current severe episode of major depressive disorder without psychotic features ([place])   DNR (do not resuscitate)   * Orthostatic hypotension- (present on  admission) -Patient with prior h/o autonomic dysfunction/adrenal insufficiency presenting with dizziness, found to be orthostatic -She was previously prescribed midodrine and did not pick up the rx -Will observe on telemetry -Start midodrine -Of note, also with reported visual field defect (see below) and age-indeterminate lacunar infarct on CTA so will order MRI; if abnormal, will consult neurology -She was given a dose of steroids in the ER but does not appear to be on chronic steroids; florinef may be a consideration -She was also referred to endocrinology as an outpatient but it is not clear if she has been seen by them -PT/OT/ST (cognitive) consults requested, as well as nutrition and TOC team  Visual field constriction of right eye- (present on admission) -She provides an inconsistent history and so it is difficult to determine the extent of the visual field deficit -On exam, it appears to be primarily blurry vision/loss of vision in the RUO quadrant, but this is not entirely clear -She was discussed with Dr. Eulas Post overnight with plan for outpatient ophthalmology evaluation but I have spoken with him this AM and he will see the patient and consult in the hospital today -MRI pending, as above; the CTA, while not completely normal, does not provide an explanation for her symptoms  Current severe episode of major depressive disorder without psychotic features ([place])- (present on admission) -Continue Cymbalta -She has a very flat affect this AM, but it could be related to somnolence from spending all night in the ER -Consider inpatient psychiatry consult if ongoing concerns once the patient is more awake/alert/interactive  Uncontrolled type 2 diabetes mellitus with hyperglycemia, with long-term current use of insulin (HCC) -Prior A1c was >15.5, indicating very poor control -Glucose this AM is 105 -Continue Lantus -Will cover with moderate-scale SSI -Consult diabetes coordinator  DNR (do  not resuscitate)- (present on admission) -I have discussed code status with the patient and the patient would not desire resuscitation and would prefer to die a natural death should that situation arise. -She will need a gold out of facility DNR form at the time of discharge      Level of care: Telemetry Medical DVT prophylaxis:  Lovenox Code Status:  DNR - confirmed with patient/family Family Communication: None present; she did not ask me to call her daughter at the time of admission. Disposition Plan:  The patient is from: home  Anticipated d/c is to: home without [facility] services  Anticipated d/c  date will depend on clinical response to treatment, but possibly as early as tomorrow if she has excellent response to treatment  Patient is currently: acutely ill Consults called: TOC team; Nutrition; PT/OT/ST  Admission status:  It is my clinical opinion that referral for OBSERVATION is reasonable and necessary in this patient based on the above information provided. The aforementioned taken together are felt to place the patient at high risk for further clinical deterioration. However it is anticipated that the patient may be medically stable for discharge from the hospital within 24 to 48 hours.    Karmen Bongo MD [facility]   How to contact the [facility] Attending or Consulting provider [place] or covering provider during after hours [place], for this patient?  Check the care team in [facility] and look for a) attending/consulting [facility] provider listed and b) the [facility] team listed Log into [email or web address] and use [place]'s universal password to access. If you do not have the password, please contact the hospital operator. Locate the [facility] provider you are looking for under [facility] and page to a number that you can be directly reached. If you still have difficulty reaching the provider, please page the [facility] (Director on Call) for the Hospitalists listed on amion for  assistance.   08/16/2021, 9:23 AM

## 2021-08-16 NOTE — ED Triage Notes (Signed)
Pt bib [facility] from home c/o dizziness , loss of balance, and lethargy today. Pt thinks she has an UTI because "urine smells funny". No painful urination reported. HR 120s, CBG 81, BP 137/70. 500 ml NS bolus given PTA. Hx neuropathy, SVT, POTS.

## 2021-08-16 NOTE — Consult Note (Addendum)
Neurology Consultation  Reason for Consult: right hemianopsia Referring Physician: Dr. Lorin Mercy  CC: right sided visual deficit  History is obtained from:patient and chart  HPI: [name] [name] is a 61 y.o. female with a history of chronic pain, uncontrolled DM (with a recent episode of DKA and A1C >15), HTN, adrenal insufficiency and orthostatic hypotension who presents with right sided visual field deficits and dizziness.  Patient is a poor historian and reports that sometime on Thursday, she developed difficulty seeing.  At times she reports this as blurred vision and at times she reports that she is unable to see objects on the right side of her field of vision. She was noted to have positive orthostatic blood pressures in the ED which remained positive after administration of 2L IVF. Ophthalmologist evaluated the patient and advised neurological work-up for possible right hemianopsia by history and confrontation testing.  LKW: 1/25 TNK given?: no, outside of window IR Thrombectomy? No, outside of window Modified Rankin Scale: 0-Completely asymptomatic and back to baseline post- stroke  ROS: A complete ROS was performed and is negative except as noted in the HPI.    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   Torn meniscus    Vertigo    Past Surgical History:  Procedure Laterality Date   ABDOMINAL  HYSTERECTOMY     CESAREAN SECTION     ORIF ANKLE FRACTURE Right 02/25/2020   Procedure: OPEN REDUCTION INTERNAL FIXATION (ORIF) ANKLE FRACTURE;  Surgeon: Lovell Sheehan, MD;  Location: [facility] ORS;  Service: Orthopedics;  Laterality: Right;     Family History  Problem Relation Age of Onset   Depression Mother        type 1   Diabetes Mother    Renal Disease Mother    Lung cancer Father    Heart attack Brother        Died age 45 - was told due to massive MI/heart "exploded"   Heart failure Maternal Grandmother        Details not totally clear     Social History:   reports that she quit smoking about 43 years ago. Her smoking use included cigarettes. She has never used smokeless tobacco. She reports that she does not currently use drugs after having used the following drugs: Marijuana. She reports that she does not drink alcohol.  Medications  Current Facility-Administered Medications:    acetaminophen (TYLENOL) tablet 650 mg, 650 mg, Oral, Q6H PRN **OR** acetaminophen (TYLENOL) suppository 650 mg, 650 mg, Rectal, Q6H PRN, Karmen Bongo, MD   bisacodyl (DULCOLAX) EC tablet 5 mg, 5 mg, Oral, Daily PRN, Karmen Bongo, MD   docusate sodium (COLACE) capsule 100 mg, 100 mg, Oral, BID, Karmen Bongo, MD, 100 mg at 08/16/21 0957   DULoxetine (CYMBALTA) DR capsule  60 mg, 60 mg, Oral, BID, Karmen Bongo, MD, 60 mg at 08/16/21 0957   enoxaparin (LOVENOX) injection 40 mg, 40 mg, Subcutaneous, Q24H, Karmen Bongo, MD, 40 mg at 08/16/21 0957   gabapentin (NEURONTIN) capsule 1,200 mg, 1,200 mg, Oral, QHS, Reome, Earle J, RPH   gabapentin (NEURONTIN) capsule 600 mg, 600 mg, Oral, BID, Reome, Earle J, RPH, 600 mg at 08/16/21 1501   insulin aspart (novoLOG) injection 0-15 Units, 0-15 Units, Subcutaneous, TID WC, Karmen Bongo, MD, 15 Units at 08/16/21 1222   insulin aspart (novoLOG) injection 0-5 Units, 0-5 Units, Subcutaneous, QHS, Karmen Bongo, MD   insulin glargine-yfgn (SEMGLEE)  injection 50 Units, 50 Units, Subcutaneous, QHS, Heloise Purpura, RPH   lactated ringers infusion, , Intravenous, Continuous, Karmen Bongo, MD, Last Rate: 75 mL/hr at 08/16/21 0955, New Bag at 08/16/21 0955   midodrine (PROAMATINE) tablet 10 mg, 10 mg, Oral, TID WC, Karmen Bongo, MD, 10 mg at 08/16/21 1223   ondansetron (ZOFRAN) tablet 4 mg, 4 mg, Oral, Q6H PRN **OR** ondansetron (ZOFRAN) injection 4 mg, 4 mg, Intravenous, Q6H PRN, Karmen Bongo, MD   oxyCODONE (Oxy IR/ROXICODONE) immediate release tablet 5 mg, 5 mg, Oral, Q4H PRN, Karmen Bongo, MD   polyethylene glycol (MIRALAX / GLYCOLAX) packet 17 g, 17 g, Oral, Daily PRN, Karmen Bongo, MD   sodium chloride flush (NS) 0.9 % injection 3 mL, 3 mL, Intravenous, Q12H, Karmen Bongo, MD, 3 mL at 08/16/21 1002   Exam: Current vital signs: BP (!) 159/84 (BP Location: Right Arm)    Pulse (!) 136    Temp (!) 97.5 F (36.4 C) (Oral)    Resp 16    Ht 5\' 6"  (1.676 m)    Wt 63.5 kg    SpO2 98%    BMI 22.60 kg/m  Vital signs in last 24 hours: Temp:  [97.5 F (36.4 C)-98 F (36.7 C)] 97.5 F (36.4 C) (01/28 1140) Pulse Rate:  [115-136] 136 (01/28 1140) Resp:  [14-21] 16 (01/28 1140) BP: (102-159)/(57-89) 159/84 (01/28 1140) SpO2:  [92 %-99 %] 98 % (01/28 1140) Weight:  [63.5 kg] 63.5 kg (01/28 0924)  GENERAL: Awake, alert, in no acute distress Psych: Affect flat, patient is calm and cooperative with examination Head: Normocephalic and atraumatic, without obvious abnormality EENT: Normal conjunctivae, dry mucous membranes, no OP obstruction LUNGS: Normal respiratory effort. Non-labored breathing on room air   NEURO:  Mental Status: Awake, alert, and oriented to person, place, time, and situation. She is not able to provide a clear and coherent history of present illness.  Speech/Language: responses are brief, 1-2 words No neglect or aphasia is noted Cranial Nerves:  II: Pupils dilated to 29mm for ophthalmology exam,  nonreactive to light. visual fields inconsistent- right lower quadrant anopsia noted with both eyes open, complete visual loss with right eye open and right hemianopsia with left eye open. III, IV, VI: EOMI. Lid elevation symmetric and full.  V: Sensation is intact to light touch and symmetrical to face.  VII: Face is symmetric resting and smiling. Able to puff cheeks and raise eyebrows.  VIII: Hearing intact to voice IX, X: Phonation normal.  XII: Tongue protrudes midline without fasciculations.   Motor: 5/5 strength in BUE, 4/5 in BLE limited by pain Tone is normal. Bulk is normal.  Sensation: Intact to light touch bilaterally in all four extremities.  Coordination: FTN intact bilaterally, unable to find finger in right field of vision No pronator drift.   Gait: Deferred   Labs I  have reviewed labs in epic and the results pertinent to this consultation are:   CBC    Component Value Date/Time   WBC 6.7 08/16/2021 0218   RBC 4.57 08/16/2021 0218   HGB 14.3 08/16/2021 0218   HGB 13.8 10/14/2020 1602   HCT 41.9 08/16/2021 0218   HCT 40.1 10/14/2020 1602   PLT 262 08/16/2021 0218   PLT 295 10/14/2020 1602   MCV 91.7 08/16/2021 0218   MCV 91 10/14/2020 1602   MCH 31.3 08/16/2021 0218   MCHC 34.1 08/16/2021 0218   RDW 11.9 08/16/2021 0218   RDW 12.7 10/14/2020 1602   LYMPHSABS 1.6 08/16/2021 0218   LYMPHSABS 3.5 (H) 10/14/2020 1602   MONOABS 0.4 08/16/2021 0218   EOSABS 0.3 08/16/2021 0218   EOSABS 0.2 10/14/2020 1602   BASOSABS 0.0 08/16/2021 0218   BASOSABS 0.0 10/14/2020 1602    CMP     Component Value Date/Time   NA 139 08/16/2021 0218   NA 135 08/09/2019 1715   K 3.6 08/16/2021 0218   CL 103 08/16/2021 0218   CO2 26 08/16/2021 0218   GLUCOSE 105 (H) 08/16/2021 0218   BUN 12 08/16/2021 0218   BUN 8 08/09/2019 1715   CREATININE 0.62 08/16/2021 0218   CREATININE 0.74 02/20/2015 1259   CALCIUM 9.4 08/16/2021 0218   PROT 6.6 08/16/2021 0218   PROT 6.9 06/28/2019  1225   ALBUMIN 3.4 (L) 08/16/2021 0218   ALBUMIN 3.9 06/28/2019 1225   AST 38 08/16/2021 0218   ALT 29 08/16/2021 0218   ALKPHOS 104 08/16/2021 0218   BILITOT 0.9 08/16/2021 0218   BILITOT 0.3 06/28/2019 1225   GFRNONAA >60 08/16/2021 0218   GFRAA >60 04/20/2020 0454    Lipid Panel     Component Value Date/Time   CHOL 197 03/15/2019 1056   TRIG 156 (H) 07/29/2020 0730   HDL 36 (L) 03/15/2019 1056   CHOLHDL 5.5 (H) 03/15/2019 1056   CHOLHDL 7.6 04/10/2016 0714   VLDL 65 (H) 04/10/2016 0714   LDLCALC 109 (H) 03/15/2019 1056  TSH 1.004 A1C >15.5  Imaging I have reviewed the images obtained:  CTA-scan of the brain Age indeterminate lacunar infarct at left caudate body, no emergent vascular finding and cervical carotid atherosclerosis with up to 35% ICA narrowing on  the left  Assessment: 61 yo patient with history of DM, (with a recent episode of DKA), HTN, adrenal insufficiency and orthostatic hypotension presents with dizziness and right sided visual deficits.  Patient is a poor historian and is able to give few details of her presenting problem and events leading up to this admission.  Her visual field examination is inconsistent with RLQ anopsia seen with both eyes open, complete visual loss with only right eye open and right hemianopsia with left eye open.  Unreliable and inconsistent reporting throughout visual field assessment.  SLP evaluation reveals score of 8/24 on SLUMS with deficits in recall.  Ophthalmology exam reveals right homonymous hemianopsia and moderate non-proliferative diabetic retinopathy along with age-related cataracts.  Plan to obtain MRI to detect possible stroke.  There is a possible psychiatric component to symptoms with recurrent episode of major depression, flat affect and unreliable reporting.  Recommendations: -MRI brain to detect possible left occipital or temporal lobe stroke -If MRI is positive, will initiate full stroke workup -If MRI is negative,  symptoms are likely functional -Management of comorbid conditions per primary team -Neurology will follow  Pt seen by NP/Neuro and later by MD. Note/plan to  be edited by MD as needed.   [place] , MSN, AGACNP-BC [facility] See Amion for schedule and pager information 08/16/2021 5:17 PM    Attending Neurohospitalist Addendum Patient seen and examined with APP/Resident. Agree with the history and physical as documented above. Agree with the plan as documented, which I helped formulate. I have independently reviewed the chart, obtained history, review of systems and examined the patient.I have personally reviewed pertinent head/neck/spine imaging (CT/MRI).  Agree with documented above-inconsistent exam but possible right hemianopsia.  MRI of the brain-if positive for stroke, complete full stroke work-up.  Otherwise symptoms likely consistent with a functional etiology. Discussed plan with Dr. Lorin Mercy.  Please feel free to call with any questions.  -- Amie [place], MD Neurologist [facility] Pager: [phone]  Addendum MRI with acute caudate infarct Still does not explain her symptoms but will need stroke work-up Stroke team will follow.  Discussed with Dr. Lorin Mercy over secure chat yesterday along with the on-call hospitalist Dr. Tonie Griffith  -- Amie [place], MD Neurologist [facility] Pager: [phone]

## 2021-08-17 ENCOUNTER — Observation Stay (HOSPITAL_COMMUNITY): Payer: Medicare Other

## 2021-08-17 DIAGNOSIS — Z794 Long term (current) use of insulin: Secondary | ICD-10-CM | POA: Diagnosis not present

## 2021-08-17 DIAGNOSIS — I6381 Other cerebral infarction due to occlusion or stenosis of small artery: Secondary | ICD-10-CM | POA: Diagnosis present

## 2021-08-17 DIAGNOSIS — G909 Disorder of the autonomic nervous system, unspecified: Secondary | ICD-10-CM | POA: Diagnosis present

## 2021-08-17 DIAGNOSIS — I69398 Other sequelae of cerebral infarction: Secondary | ICD-10-CM | POA: Diagnosis not present

## 2021-08-17 DIAGNOSIS — F1290 Cannabis use, unspecified, uncomplicated: Secondary | ICD-10-CM | POA: Diagnosis present

## 2021-08-17 DIAGNOSIS — Z833 Family history of diabetes mellitus: Secondary | ICD-10-CM | POA: Diagnosis not present

## 2021-08-17 DIAGNOSIS — Z79899 Other long term (current) drug therapy: Secondary | ICD-10-CM | POA: Diagnosis not present

## 2021-08-17 DIAGNOSIS — R4189 Other symptoms and signs involving cognitive functions and awareness: Secondary | ICD-10-CM | POA: Diagnosis present

## 2021-08-17 DIAGNOSIS — Z91199 Patient's noncompliance with other medical treatment and regimen due to unspecified reason: Secondary | ICD-10-CM | POA: Diagnosis not present

## 2021-08-17 DIAGNOSIS — Z66 Do not resuscitate: Secondary | ICD-10-CM | POA: Diagnosis present

## 2021-08-17 DIAGNOSIS — Z818 Family history of other mental and behavioral disorders: Secondary | ICD-10-CM | POA: Diagnosis not present

## 2021-08-17 DIAGNOSIS — Z885 Allergy status to narcotic agent status: Secondary | ICD-10-CM | POA: Diagnosis not present

## 2021-08-17 DIAGNOSIS — Z841 Family history of disorders of kidney and ureter: Secondary | ICD-10-CM | POA: Diagnosis not present

## 2021-08-17 DIAGNOSIS — J189 Pneumonia, unspecified organism: Secondary | ICD-10-CM | POA: Diagnosis present

## 2021-08-17 DIAGNOSIS — H53481 Generalized contraction of visual field, right eye: Secondary | ICD-10-CM | POA: Diagnosis not present

## 2021-08-17 DIAGNOSIS — I10 Essential (primary) hypertension: Secondary | ICD-10-CM | POA: Diagnosis present

## 2021-08-17 DIAGNOSIS — I6389 Other cerebral infarction: Secondary | ICD-10-CM | POA: Diagnosis not present

## 2021-08-17 DIAGNOSIS — H538 Other visual disturbances: Secondary | ICD-10-CM | POA: Diagnosis present

## 2021-08-17 DIAGNOSIS — I69351 Hemiplegia and hemiparesis following cerebral infarction affecting right dominant side: Secondary | ICD-10-CM | POA: Diagnosis present

## 2021-08-17 DIAGNOSIS — E1143 Type 2 diabetes mellitus with diabetic autonomic (poly)neuropathy: Secondary | ICD-10-CM | POA: Diagnosis present

## 2021-08-17 DIAGNOSIS — Z801 Family history of malignant neoplasm of trachea, bronchus and lung: Secondary | ICD-10-CM | POA: Diagnosis not present

## 2021-08-17 DIAGNOSIS — Z87891 Personal history of nicotine dependence: Secondary | ICD-10-CM | POA: Diagnosis not present

## 2021-08-17 DIAGNOSIS — I639 Cerebral infarction, unspecified: Secondary | ICD-10-CM | POA: Diagnosis not present

## 2021-08-17 DIAGNOSIS — R000 Tachycardia, unspecified: Secondary | ICD-10-CM | POA: Diagnosis present

## 2021-08-17 DIAGNOSIS — E113392 Type 2 diabetes mellitus with moderate nonproliferative diabetic retinopathy without macular edema, left eye: Secondary | ICD-10-CM | POA: Diagnosis present

## 2021-08-17 DIAGNOSIS — E2740 Unspecified adrenocortical insufficiency: Secondary | ICD-10-CM | POA: Diagnosis present

## 2021-08-17 DIAGNOSIS — Z8249 Family history of ischemic heart disease and other diseases of the circulatory system: Secondary | ICD-10-CM | POA: Diagnosis not present

## 2021-08-17 DIAGNOSIS — H53461 Homonymous bilateral field defects, right side: Secondary | ICD-10-CM | POA: Diagnosis present

## 2021-08-17 DIAGNOSIS — E1165 Type 2 diabetes mellitus with hyperglycemia: Secondary | ICD-10-CM | POA: Diagnosis present

## 2021-08-17 DIAGNOSIS — M5030 Other cervical disc degeneration, unspecified cervical region: Secondary | ICD-10-CM | POA: Diagnosis present

## 2021-08-17 DIAGNOSIS — Z20822 Contact with and (suspected) exposure to covid-19: Secondary | ICD-10-CM | POA: Diagnosis present

## 2021-08-17 DIAGNOSIS — R2689 Other abnormalities of gait and mobility: Secondary | ICD-10-CM | POA: Diagnosis present

## 2021-08-17 DIAGNOSIS — G894 Chronic pain syndrome: Secondary | ICD-10-CM | POA: Diagnosis present

## 2021-08-17 DIAGNOSIS — F322 Major depressive disorder, single episode, severe without psychotic features: Secondary | ICD-10-CM | POA: Diagnosis present

## 2021-08-17 DIAGNOSIS — I951 Orthostatic hypotension: Secondary | ICD-10-CM | POA: Diagnosis present

## 2021-08-17 DIAGNOSIS — K056 Periodontal disease, unspecified: Secondary | ICD-10-CM | POA: Diagnosis present

## 2021-08-17 DIAGNOSIS — I63442 Cerebral infarction due to embolism of left cerebellar artery: Secondary | ICD-10-CM | POA: Diagnosis not present

## 2021-08-17 DIAGNOSIS — E1142 Type 2 diabetes mellitus with diabetic polyneuropathy: Secondary | ICD-10-CM | POA: Diagnosis present

## 2021-08-17 LAB — CBC
HCT: 34 % — ABNORMAL LOW (ref 36.0–46.0)
Hemoglobin: 11.4 g/dL — ABNORMAL LOW (ref 12.0–15.0)
MCH: 31 pg (ref 26.0–34.0)
MCHC: 33.5 g/dL (ref 30.0–36.0)
MCV: 92.4 fL (ref 80.0–100.0)
Platelets: 225 10*3/uL (ref 150–400)
RBC: 3.68 MIL/uL — ABNORMAL LOW (ref 3.87–5.11)
RDW: 12 % (ref 11.5–15.5)
WBC: 11.1 10*3/uL — ABNORMAL HIGH (ref 4.0–10.5)
nRBC: 0 % (ref 0.0–0.2)

## 2021-08-17 LAB — BASIC METABOLIC PANEL
Anion gap: 6 (ref 5–15)
BUN: 18 mg/dL (ref 8–23)
CO2: 24 mmol/L (ref 22–32)
Calcium: 8.6 mg/dL — ABNORMAL LOW (ref 8.9–10.3)
Chloride: 103 mmol/L (ref 98–111)
Creatinine, Ser: 0.68 mg/dL (ref 0.44–1.00)
GFR, Estimated: >60 mL/min (ref >60)
Glucose, Bld: 283 mg/dL — ABNORMAL HIGH (ref 70–99)
Potassium: 3.9 mmol/L (ref 3.5–5.1)
Sodium: 133 mmol/L — ABNORMAL LOW (ref 135–145)

## 2021-08-17 LAB — GLUCOSE, CAPILLARY
Glucose-Capillary: 227 mg/dL — ABNORMAL HIGH (ref 70–99)
Glucose-Capillary: 267 mg/dL — ABNORMAL HIGH (ref 70–99)
Glucose-Capillary: 157 mg/dL — ABNORMAL HIGH (ref 70–99)
Glucose-Capillary: 259 mg/dL — ABNORMAL HIGH (ref 70–99)

## 2021-08-17 LAB — ECHOCARDIOGRAM COMPLETE
Area-P 1/2: 5.66 cm2
Calc EF: 56.6 %
Height: 66 in
S' Lateral: 2.2 cm
Single Plane A2C EF: 60.5 %
Single Plane A4C EF: 55.5 %
Weight: 2240 oz

## 2021-08-17 LAB — HEMOGLOBIN A1C
Hgb A1c MFr Bld: 13.3 % — ABNORMAL HIGH (ref 4.8–5.6)
Mean Plasma Glucose: 335.01 mg/dL

## 2021-08-17 MED ORDER — AZITHROMYCIN 500 MG PO TABS
500.0000 mg | ORAL_TABLET | Freq: Every day | ORAL | Status: AC
  Administered 2021-08-17 – 2021-08-19 (×3): 500 mg via ORAL
  Filled 2021-08-17 (×3): qty 1

## 2021-08-17 MED ORDER — ASPIRIN 325 MG PO TABS
325.0000 mg | ORAL_TABLET | Freq: Every day | ORAL | Status: DC
  Administered 2021-08-17 – 2021-08-19 (×3): 325 mg via ORAL
  Filled 2021-08-17 (×3): qty 1

## 2021-08-17 MED ORDER — INSULIN ASPART 100 UNIT/ML IJ SOLN
5.0000 [IU] | Freq: Three times a day (TID) | INTRAMUSCULAR | Status: DC
  Administered 2021-08-17 – 2021-08-18 (×2): 5 [IU] via SUBCUTANEOUS

## 2021-08-17 MED ORDER — HYDROCORTISONE 10 MG PO TABS
10.0000 mg | ORAL_TABLET | Freq: Every day | ORAL | Status: DC
  Administered 2021-08-17 – 2021-08-20 (×4): 10 mg via ORAL
  Filled 2021-08-17 (×4): qty 1

## 2021-08-17 MED ORDER — SODIUM CHLORIDE 0.9 % IV SOLN
1.0000 g | INTRAVENOUS | Status: DC
  Administered 2021-08-17 – 2021-08-18 (×2): 1 g via INTRAVENOUS
  Filled 2021-08-17 (×2): qty 10

## 2021-08-17 MED ORDER — ATORVASTATIN CALCIUM 40 MG PO TABS
40.0000 mg | ORAL_TABLET | Freq: Every day | ORAL | Status: DC
  Administered 2021-08-17 – 2021-08-20 (×4): 40 mg via ORAL
  Filled 2021-08-17 (×4): qty 1

## 2021-08-17 NOTE — Progress Notes (Signed)
°  Echocardiogram 2D Echocardiogram has been performed.  [name] [name] 08/17/2021, 9:26 AM

## 2021-08-17 NOTE — Progress Notes (Signed)
PT Cancellation Note  Patient Details Name: [name] [name] MRN: [id number] DOB: 12-27-1960   Cancelled Treatment:    Reason Eval/Treat Not Completed: Patient declined, no reason specified. Pt refusing to participate in PT eval at this time, very upset just finding out that she had a stroke. PT will continue to f/u with pt acutely and attempt evaluation as available and appropriate.    Clearnce Sorrel Pietro Bonura 08/17/2021, 10:52 AM

## 2021-08-17 NOTE — Progress Notes (Signed)
PROGRESS NOTE    [name] [name]  JQZ:[id number] DOB: 11/28/1960 DOA: 08/16/2021 PCP: Charlott Rakes, MD   Chief Complain: Dizziness  Brief Narrative: Patient is a 61 year old female with history of chronic pain syndrome, uncontrolled diabetes with A1c more than 15, adrenal insufficiency, orthostatic hypotension secondary to autonomic dysfunction who presented with dizziness, blurred vision.  Symptoms started 4 days before admission.  As per the report, she was noncompliant with her medications.  On presentation orthostatic vital signs were positive.  Patient was complaining of bilateral blurry vision so ophthalmology consulted.  CT imagings showed age-indeterminate lacunar infarct, no evidence of large vessel occlusion, cervical carotid atherosclerosis.  Chest x-ray showed medial right lower lobe pneumonia.  MRI showed  left caudate tail acute infarct.  Neurology consulted and following.  Stroke work-up initiated.  Assessment & Plan:   Principal Problem:   Orthostatic hypotension Active Problems:   Uncontrolled type 2 diabetes mellitus with hyperglycemia, with long-term current use of insulin (HCC)   Current severe episode of major depressive disorder without psychotic features (HCC)   Visual field constriction of right eye   DNR (do not resuscitate)   Acute ischemic stroke: MRI of the brain showed left caudate tail acute infarct.  Presented with dizziness, blurry vision.  Stroke work-up initiated. PT/OT/speech evaluation requested.  CT angio head and neck showed no emergent vascular finding,cervical carotid atherosclerosis with up to 35% ICA narrowing on the left. Follow-up LDL, hemoglobin A1c of 13.3.Echo pending She has generalized weakness, still has blurry vision on the right side and loss of lateral visual field on the right  Right-sided pneumonia: Chest x-ray showed medial right lower lobe pneumonia.  She is on room air, does not complain of any cough.  Lungs were clear on  auscultation .started on ceftriaxone ,azithromycin.  She was previously admitted for pneumonia in December on the same side.  We will continue short course of antibiotics  Chronic orthostatic hypotension: History of autonomic dysfunction, adrenal insufficiency.  Orthostatic vitals were positive.  She is chronically on midodrine.  Fall precautions.    History of adrenal insufficiency: Previously taking hydrocortisone 10 mg daily .but currently she is not taking any most likely secondary to noncompliance.  Her previous notes from PCP clearly says that she should be on hydrocortisone 10 mg daily though it can increase her blood sugars.  She was given a dose of steroid in the emergency department.  We will start her on hydrocortisone 10 mg daily.  Blurry vision: Complained of blurry vision on the right side.  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  Diabetic monitor consulted.  She has history of noncompliance and says that she forgets to take her medications sometimes.  Continue insulin at current dose.  Monitor blood sugars.  A1c of 13.3  Chronic sinus tachycardia: Chronic problem.  Normal TSH.  No room for starting on beta-blocker due to hypotension  Chronic pain syndrome: CT cervical spine showed multilevel degenerative disc disease.  Continue supportive care           DVT prophylaxis:Lovenox Code Status: Full Family Communication:: Discussed with daughter on phone on 1/29 Patient status:Inpatient  Dispo: The patient is from: Home              Anticipated d/c is to: Home vs SNF              Anticipated d/c date is: Pending stroke work-up, PT evaluation  Consultants:  Neurology  Procedures: None  Antimicrobials:  Anti-infectives (From admission, onward)    Start     Dose/Rate Route Frequency Ordered Stop   08/17/21 1000  azithromycin (ZITHROMAX) tablet 500 mg        500 mg Oral Daily  08/17/21 0806     08/17/21 0900  cefTRIAXone (ROCEPHIN) 1 g in sodium chloride 0.9 % 100 mL IVPB        1 g 200 mL/hr over 30 Minutes Intravenous Every 24 hours 08/17/21 0806         Subjective: Patient seen and examined at the bedside this morning.   blood pressure was soft but stable.  She is in mild sinus tachycardia.  Complains of blurry vision on the right side.  Does not have focal deficits but has generalized weakness.  Looks very weak, deconditioned  Objective: Vitals:   08/16/21 1930 08/16/21 1956 08/16/21 2310 08/17/21 0338  BP: (!) 117/57 (!) 105/55 (!) 93/52 (!) 104/57  Pulse: (!) 129 (!) 125 (!) 116 (!) 111  Resp: 18 16 16 18   Temp: 98.7 F (37.1 C) (!) 97.4 F (36.3 C) 98.7 F (37.1 C) 98.1 F (36.7 C)  TempSrc: Oral Oral Oral Oral  SpO2: 97% 94% 96% 93%  Weight:      Height:        Intake/Output Summary (Last 24 hours) at 08/17/2021 0806 Last data filed at 08/16/2021 2311 Gross per 24 hour  Intake 477.96 ml  Output 2150 ml  Net -1672.04 ml   Filed Weights   08/16/21 0924  Weight: 63.5 kg    Examination:  General exam: Chronically ill looking, weak, deconditioned HEENT: PERRL Respiratory system:  no wheezes or crackles  Cardiovascular system: S1 & S2 heard, RRR.  Gastrointestinal system: Abdomen is nondistended, soft and nontender. Central nervous system: Alert and oriented, generalized weakness, right peripheral blurry vision Extremities: No edema, no clubbing ,no cyanosis Skin: No rashes, no ulcers,no icterus      Data Reviewed: I have personally reviewed following labs and imaging studies  CBC: Recent Labs  Lab 08/16/21 0218 08/17/21 0106  WBC 6.7 11.1*  NEUTROABS 4.4  --   HGB 14.3 11.4*  HCT 41.9 34.0*  MCV 91.7 92.4  PLT 262 696   Basic Metabolic Panel: Recent Labs  Lab 08/16/21 0218 08/17/21 0106  NA 139 133*  K 3.6 3.9  CL 103 103  CO2 26 24  GLUCOSE 105* 283*  BUN 12 18  CREATININE 0.62 0.68  CALCIUM 9.4 8.6*    GFR: Estimated Creatinine Clearance: 69.1 mL/min (by C-G formula based on SCr of 0.68 mg/dL). Liver Function Tests: Recent Labs  Lab 08/16/21 0218  AST 38  ALT 29  ALKPHOS 104  BILITOT 0.9  PROT 6.6  ALBUMIN 3.4*   No results for input(s): LIPASE, AMYLASE in the last 168 hours. No results for input(s): AMMONIA in the last 168 hours. Coagulation Profile: No results for input(s): INR, PROTIME in the last 168 hours. Cardiac Enzymes: No results for input(s): CKTOTAL, CKMB, CKMBINDEX, TROPONINI in the last 168 hours. BNP (last 3 results) No results for input(s): PROBNP in the last 8760 hours. HbA1C: No results for input(s): HGBA1C in the last 72 hours. CBG: Recent Labs  Lab 08/16/21 0215 08/16/21 1143 08/16/21 1520 08/16/21 2134 08/17/21 0603  GLUCAP 100* 392* 357* 367* 227*   Lipid Profile: No results for input(s): CHOL, HDL, LDLCALC, TRIG, CHOLHDL, LDLDIRECT in the last 72 hours. Thyroid Function Tests: Recent Labs  08/16/21 0438  TSH 1.004   Anemia Panel: No results for input(s): VITAMINB12, FOLATE, FERRITIN, TIBC, IRON, RETICCTPCT in the last 72 hours. Sepsis Labs: No results for input(s): PROCALCITON, LATICACIDVEN in the last 168 hours.  Recent Results (from the past 240 hour(s))  Resp Panel by RT-PCR (Flu A&B, Covid) Nasopharyngeal Swab     Status: None   Collection Time: 08/16/21  2:09 AM   Specimen: Nasopharyngeal Swab; Nasopharyngeal(NP) swabs in vial transport medium  Result Value Ref Range Status   SARS Coronavirus 2 by RT PCR NEGATIVE NEGATIVE Final    Comment: (NOTE) SARS-CoV-2 target nucleic acids are NOT DETECTED.  The SARS-CoV-2 RNA is generally detectable in upper respiratory specimens during the acute phase of infection. The lowest concentration of SARS-CoV-2 viral copies this assay can detect is 138 copies/mL. A negative result does not preclude SARS-Cov-2 infection and should not be used as the sole basis for treatment or other patient  management decisions. A negative result may occur with  improper specimen collection/handling, submission of specimen other than nasopharyngeal swab, presence of viral mutation(s) within the areas targeted by this assay, and inadequate number of viral copies(<138 copies/mL). A negative result must be combined with clinical observations, patient history, and epidemiological information. The expected result is Negative.  Fact Sheet for Patients:  EntrepreneurPulse.com.au  Fact Sheet for Healthcare Providers:  IncredibleEmployment.be  This test is no t yet approved or cleared by the [place] FDA and  has been authorized for detection and/or diagnosis of SARS-CoV-2 by FDA under an Emergency Use Authorization (EUA). This EUA will remain  in effect (meaning this test can be used) for the duration of the COVID-19 declaration under Section 564(b)(1) of the Act, 21 U.S.C.section 360bbb-3(b)(1), unless the authorization is terminated  or revoked sooner.       Influenza A by PCR NEGATIVE NEGATIVE Final   Influenza B by PCR NEGATIVE NEGATIVE Final    Comment: (NOTE) The Xpert Xpress SARS-CoV-2/FLU/RSV plus assay is intended as an aid in the diagnosis of influenza from Nasopharyngeal swab specimens and should not be used as a sole basis for treatment. Nasal washings and aspirates are unacceptable for Xpert Xpress SARS-CoV-2/FLU/RSV testing.  Fact Sheet for Patients: EntrepreneurPulse.com.au  Fact Sheet for Healthcare Providers: IncredibleEmployment.be  This test is not yet approved or cleared by the [place] FDA and has been authorized for detection and/or diagnosis of SARS-CoV-2 by FDA under an Emergency Use Authorization (EUA). This EUA will remain in effect (meaning this test can be used) for the duration of the COVID-19 declaration under Section 564(b)(1) of the Act, 21 U.S.C. section 360bbb-3(b)(1),  unless the authorization is terminated or revoked.  Performed at [facility], [place] [address], [place], [place] [address]          Radiology Studies: CT ANGIO HEAD NECK W WO CM  Result Date: 08/16/2021 CLINICAL DATA:  Dizziness, loss of balance, and lethargy EXAM: CT ANGIOGRAPHY HEAD AND NECK TECHNIQUE: Multidetector CT imaging of the head and neck was performed using the standard protocol during bolus administration of intravenous contrast. Multiplanar CT image reconstructions and MIPs were obtained to evaluate the vascular anatomy. Carotid stenosis measurements (when applicable) are obtained utilizing NASCET criteria, using the distal internal carotid diameter as the denominator. RADIATION DOSE REDUCTION: This exam was performed according to the departmental dose-optimization program which includes automated exposure control, adjustment of the mA and/or kV according to patient size and/or use of iterative reconstruction technique. CONTRAST:  76mL OMNIPAQUE IOHEXOL  350 MG/ML SOLN COMPARISON:  None. FINDINGS: CT HEAD FINDINGS Brain: Intermediate density at the left caudate body and upper thalamus. No hemorrhage, hydrocephalus, or masslike finding. Vascular: See below Skull: No acute or aggressive finding. Periapical erosion around minimally covered right upper teeth. Sinuses: Negative Orbits: Negative Review of the MIP images confirms the above findings CTA NECK FINDINGS Aortic arch: Unremarkable where covered.  Three vessel branching. Right carotid system: Calcified plaque at the posterior wall of the right ICA. No significant stenosis and no ulceration. Left carotid system: Mixed density plaque the bifurcation and proximal ICA. Proximal ICA stenosis measures 35%. No ulceration or beading. Vertebral arteries: No proximal subclavian stenosis or irregularity. Codominant vertebral arteries which are smoothly contoured and widely patent to the dura. Skeleton: Ordinary cervical spine degeneration.  Other neck: No acute or aggressive finding. Subcentimeter right thyroid nodule. No followup recommended (ref: J Am Coll Radiol. 2015 Feb;12(2): 143-50). Upper chest: Negative Review of the MIP images confirms the above findings CTA HEAD FINDINGS Anterior circulation: No significant stenosis, proximal occlusion, aneurysm, or vascular malformation. Posterior circulation: No significant stenosis, proximal occlusion, aneurysm, or vascular malformation. Venous sinuses: As permitted by contrast timing, patent. Anatomic variants: None Review of the MIP images confirms the above findings IMPRESSION: 1. 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 2. No emergent vascular finding. 3. Cervical carotid atherosclerosis with up to 35% ICA narrowing on the left. Electronically Signed   By: Jorje Guild M.D.   On: 08/16/2021 05:29   MR BRAIN WO CONTRAST  Result Date: 08/16/2021 CLINICAL DATA:  Acute neurologic deficit EXAM: MRI HEAD WITHOUT CONTRAST TECHNIQUE: Multiplanar, multiecho pulse sequences of the brain and surrounding structures were obtained without intravenous contrast. COMPARISON:  None. FINDINGS: Brain: There is an acute infarct of the left caudate tail. No other diffusion abnormality. No acute or chronic hemorrhage. There is multifocal hyperintense T2-weighted signal within the white matter. Parenchymal volume and CSF spaces are normal. The midline structures are normal. Vascular: Major flow voids are preserved. Skull and upper cervical spine: Normal calvarium and skull base. Visualized upper cervical spine and soft tissues are normal. Sinuses/Orbits:No paranasal sinus fluid levels or advanced mucosal thickening. No mastoid or middle ear effusion. Normal orbits. IMPRESSION: Acute infarct of the left caudate tail. No hemorrhage or mass effect. Electronically Signed   By: Ulyses Jarred M.D.   On: 08/16/2021 19:15        Scheduled Meds:  aspirin  325 mg Oral Daily   atorvastatin  40 mg Oral  Daily   azithromycin  500 mg Oral Daily   docusate sodium  100 mg Oral BID   DULoxetine  60 mg Oral BID   enoxaparin (LOVENOX) injection  40 mg Subcutaneous Q24H   gabapentin  1,200 mg Oral QHS   gabapentin  600 mg Oral BID   insulin aspart  0-15 Units Subcutaneous TID WC   insulin aspart  0-5 Units Subcutaneous QHS   insulin glargine-yfgn  50 Units Subcutaneous QHS   midodrine  10 mg Oral TID WC   sodium chloride flush  3 mL Intravenous Q12H   Continuous Infusions:  cefTRIAXone (ROCEPHIN)  IV     lactated ringers 75 mL/hr at 08/17/21 0222     LOS: 0 days    Time spent: More than 50% of that time was spent in counseling and/or coordination of care.      Shelly Coss, MD [facility] P1/29/2023, 8:06 AM

## 2021-08-17 NOTE — Evaluation (Signed)
Occupational Therapy Evaluation Patient Details Name: [name] [name] MRN: [id number] DOB: 02-28-1961 Today's Date: 08/17/2021   History of Present Illness [name] [name] is a 61 y.o. female who presents with right sided visual field deficits and dizziness. CT (+) lacunar infarct at the left caudate body. Pt with a history of chronic pain, uncontrolled DM (with a recent episode of DKA and A1C >15), HTN, adrenal insufficiency and orthostatic hypotension   Clinical Impression   [name] [name] was generally mod I PTA with use of a SPC intermittently. She lives in a 1 level home with 1 STE with a friend who is not able to physically assist pt, but can provide 24/7 supervision. Upon evaluation pt was emotional due to MD news of no driving with new visual field impairment. Pt was extremely flat throughout and required increased time, cues and encouragement to follow commands. Overall she was limited by generalized weakness, slow/deliberate coordination, impaired balance and R visual field impairment which will need to be evaluated further acutely. Pt now requires up to min A for mobility and ADLs, and mod verbal cues for visual compensatory techniques. Pt will benefit from OT acutely. Recommend d/c to AIR to progress pt back to her mod I baseline prior to d/c home.      Recommendations for follow up therapy are one component of a multi-disciplinary discharge planning process, led by the attending physician.  Recommendations may be updated based on patient status, additional functional criteria and insurance authorization.   Follow Up Recommendations  Acute inpatient rehab (3hours/day)    Assistance Recommended at Discharge Frequent or constant Supervision/Assistance  Patient can return home with the following A little help with walking and/or transfers;A little help with bathing/dressing/bathroom;Assistance with cooking/housework;Direct supervision/assist for medications management;Assist for transportation;Help with  stairs or ramp for entrance    Functional Status Assessment  Patient has had a recent decline in their functional status and demonstrates the ability to make significant improvements in function in a reasonable and predictable amount of time.  Equipment Recommendations  None recommended by OT    Recommendations for Other Services Rehab consult     Precautions / Restrictions Precautions Precautions: Fall Precaution Comments: Right homonymous hemianopia Restrictions Weight Bearing Restrictions: No      Mobility Bed Mobility Overal bed mobility: Needs Assistance Bed Mobility: Supine to Sit     Supine to sit: Supervision     General bed mobility comments: for safety due to BP    Transfers Overall transfer level: Needs assistance Equipment used: 1 person hand held assist Transfers: Sit to/from Stand Sit to Stand: Min assist           General transfer comment: min A for steadying upon standing      Balance Overall balance assessment: Needs assistance Sitting-balance support: Feet supported Sitting balance-Leahy Scale: Fair     Standing balance support: Single extremity supported Standing balance-Leahy Scale: Fair                             ADL either performed or assessed with clinical judgement   ADL Overall ADL's : Needs assistance/impaired Eating/Feeding: Independent;Sitting   Grooming: Set up;Sitting   Upper Body Bathing: Set up;Sitting   Lower Body Bathing: Minimal assistance;Sit to/from stand   Upper Body Dressing : Set up;Sitting   Lower Body Dressing: Minimal assistance;Sit to/from stand Lower Body Dressing Details (indicate cue type and reason): able to don socks while sitting EOB Toilet Transfer: Min  guard;Stand-pivot;Rolling walker (2 wheels) Toilet Transfer Details (indicate cue type and reason): SP only for safety this session Toileting- Clothing Manipulation and Hygiene: Min guard;Sitting/lateral lean       Functional  mobility during ADLs: Minimal assistance General ADL Comments: pt limited by overall affect this session, required maximal encouragement for participation and incrased time for all tasks. Pt requires cues for R visual environment     Vision Baseline Vision/History: 1 Wears glasses (for reading only) Ability to See in Adequate Light: 1 Impaired Patient Visual Report: Blurring of vision;Peripheral vision impairment (R visual cut.) Vision Assessment?: Yes Eye Alignment: Within Functional Limits Ocular Range of Motion: Restricted on the right;Impaired-to be further tested in functional context Alignment/Gaze Preference: Within Defined Limits Tracking/Visual Pursuits: Impaired - to be further tested in functional context Saccades: Additional head turns occurred during testing;Impaired - to be further tested in functional context Convergence: Within functional limits Visual Fields: Right homonymous hemianopsia Additional Comments: difficult to fully assess this session - multiple interruptions throughout. Per assessment, pt presents with R visual field deficit & needs to be evaluated further.            Pertinent Vitals/Pain Pain Assessment Pain Assessment: No/denies pain     Hand Dominance Right   Extremity/Trunk Assessment Upper Extremity Assessment Upper Extremity Assessment: RUE deficits/detail RUE Deficits / Details: Slow and deliberate coordination, limited over head ROM. Questionable effort given on evaluation RUE Coordination: decreased fine motor   Lower Extremity Assessment Lower Extremity Assessment: Defer to PT evaluation   Cervical / Trunk Assessment Cervical / Trunk Assessment: Normal   Communication Communication Communication: No difficulties   Cognition Arousal/Alertness: Awake/alert Behavior During Therapy: Flat affect, Anxious Overall Cognitive Status: Impaired/Different from baseline Area of Impairment: Following commands, Safety/judgement, Awareness        Following Commands: Follows one step commands with increased time Safety/Judgement: Decreased awareness of deficits Awareness: Emergent   General Comments: pt extremely flat throughout the session, emotional about MD news of no driving. Follows one step commands with increased time.     General Comments  VSS on RA - multiple interruptions this session, pt emotionald and overwhelmed.     Home Living Family/patient expects to be discharged to:: Private residence Living Arrangements: Other (Comment) ("friend") Available Help at Discharge: Friend(s);Available PRN/intermittently Type of Home: House Home Access: Stairs to enter [employer] of Steps: 1   Home Layout: One level     Bathroom Shower/Tub: Teacher, early years/pre: Standard     Home Equipment: Conservation officer, nature (2 wheels);Cane - single point;Shower seat;Grab bars - tub/shower   Additional Comments: pt states her friend cannot assist her "She is in as bad of shape as I am."  Lives With: Friend(s)    Prior Functioning/Environment Prior Level of Function : Independent/Modified Independent             Mobility Comments: usd SPC ADLs Comments: indep        OT Problem List: Decreased strength;Decreased range of motion;Decreased activity tolerance;Impaired balance (sitting and/or standing);Impaired vision/perception;Decreased coordination;Decreased safety awareness;Decreased knowledge of precautions      OT Treatment/Interventions: Self-care/ADL training;Therapeutic exercise;DME and/or AE instruction;Therapeutic activities;Visual/perceptual remediation/compensation;Patient/family education;Balance training    OT Goals(Current goals can be found in the care plan section) Acute Rehab OT Goals Patient Stated Goal: to drive again OT Goal Formulation: With patient Time For Goal Achievement: 08/31/21 Potential to Achieve Goals: Good ADL Goals Pt Will Perform Grooming: with modified  independence;standing Pt Will Perform Lower Body Bathing: with  modified independence;sit to/from stand Pt Will Perform Lower Body Dressing: with modified independence;sit to/from stand Pt Will Transfer to Toilet: with modified independence;ambulating Additional ADL Goal #1: pt will independently identify necessary items in R visual field with compensatory techniques to complete ADLs  OT Frequency:         AM-PAC OT "6 Clicks" Daily Activity     Outcome Measure Help from another person eating meals?: None Help from another person taking care of personal grooming?: A Little Help from another person toileting, which includes using toliet, bedpan, or urinal?: A Little Help from another person bathing (including washing, rinsing, drying)?: A Little Help from another person to put on and taking off regular upper body clothing?: None Help from another person to put on and taking off regular lower body clothing?: A Little 6 Click Score: 20   End of Session Equipment Utilized During Treatment: Gait belt Nurse Communication: Mobility status  Activity Tolerance: Patient tolerated treatment well Patient left: in chair;with call bell/phone within reach;with chair alarm set;with nursing/sitter in room  OT Visit Diagnosis: Other abnormalities of gait and mobility (R26.89);Muscle weakness (generalized) (M62.81);Low vision, both eyes (H54.2)                Time: 5993-5701 OT Time Calculation (min): 30 min Charges:  OT General Charges $OT Visit: 1 Visit OT Evaluation $OT Eval Moderate Complexity: 1 Mod OT Treatments $Therapeutic Activity: 8-22 mins   [name] [name] 08/17/2021, 10:47 AM

## 2021-08-17 NOTE — Progress Notes (Signed)
Inpatient Diabetes Program Recommendations  AACE/ADA: New Consensus Statement on Inpatient Glycemic Control (2015)  Target Ranges:  Prepandial:   less than 140 mg/dL      Peak postprandial:   less than 180 mg/dL (1-2 hours)      Critically ill patients:  140 - 180 mg/dL   Lab Results  Component Value Date   GLUCAP 267 (H) 08/17/2021   HGBA1C 13.3 (H) 08/17/2021    Review of Glycemic Control  Latest Reference Range & Units 08/16/21 21:34 08/17/21 06:03 08/17/21 11:29  Glucose-Capillary 70 - 99 mg/dL 367 (H) 227 (H) 267 (H)   Diabetes history: DM 2 Outpatient Diabetes medications:  Humalog 0-12 units tid with meals, Lantus 50 units q HS Current orders for Inpatient glycemic control:  Novolog moderate tid with meals and HS Semglee 50 units q HS Inpatient Diabetes Program Recommendations:    Please add Novolog meal coverage 5 units tid with meals (hold if patient eats less than 50% or NPO).  Diabetes coordinator will see patient on 08/18/21 to discuss A1C and diabetes management.  Thanks,  Adah Perl, RN, BC-ADM Inpatient Diabetes Coordinator Pager [phone]  (8a-5p)

## 2021-08-17 NOTE — Progress Notes (Addendum)
STROKE TEAM PROGRESS NOTE   INTERVAL HISTORY OT at the bedside. Patient has significant right hemianopia but otherwise reports no other emergent neurological deficits. Patient extremely distraught after being notified she can no longer drive a car until her vision is better rehabilitated. Patient had recently purchased a new car 1.5 months ago.  Vitals:   08/16/21 1956 08/16/21 2310 08/17/21 0338 08/17/21 0851  BP: (!) 105/55 (!) 93/52 (!) 104/57 109/67  Pulse: (!) 125 (!) 116 (!) 111 (!) 106  Resp: 16 16 18 16   Temp: (!) 97.4 F (36.3 C) 98.7 F (37.1 C) 98.1 F (36.7 C) 97.8 F (36.6 C)  TempSrc: Oral Oral Oral Oral  SpO2: 94% 96% 93% 96%  Weight:      Height:       CBC:  Recent Labs  Lab 08/16/21 0218 08/17/21 0106  WBC 6.7 11.1*  NEUTROABS 4.4  --   HGB 14.3 11.4*  HCT 41.9 34.0*  MCV 91.7 92.4  PLT 262 332   Basic Metabolic Panel:  Recent Labs  Lab 08/16/21 0218 08/17/21 0106  NA 139 133*  K 3.6 3.9  CL 103 103  CO2 26 24  GLUCOSE 105* 283*  BUN 12 18  CREATININE 0.62 0.68  CALCIUM 9.4 8.6*   Lipid Panel: No results for input(s): CHOL, TRIG, HDL, CHOLHDL, VLDL, LDLCALC in the last 168 hours. HgbA1c:  Recent Labs  Lab 08/17/21 0106  HGBA1C 13.3*   Urine Drug Screen: No results for input(s): LABOPIA, COCAINSCRNUR, LABBENZ, AMPHETMU, THCU, LABBARB in the last 168 hours.  Alcohol Level No results for input(s): ETH in the last 168 hours.  IMAGING past 24 hours MR BRAIN WO CONTRAST  Result Date: 08/16/2021 CLINICAL DATA:  Acute neurologic deficit EXAM: MRI HEAD WITHOUT CONTRAST TECHNIQUE: Multiplanar, multiecho pulse sequences of the brain and surrounding structures were obtained without intravenous contrast. COMPARISON:  None. FINDINGS: Brain: There is an acute infarct of the left caudate tail. No other diffusion abnormality. No acute or chronic hemorrhage. There is multifocal hyperintense T2-weighted signal within the white matter. Parenchymal volume and  CSF spaces are normal. The midline structures are normal. Vascular: Major flow voids are preserved. Skull and upper cervical spine: Normal calvarium and skull base. Visualized upper cervical spine and soft tissues are normal. Sinuses/Orbits:No paranasal sinus fluid levels or advanced mucosal thickening. No mastoid or middle ear effusion. Normal orbits. IMPRESSION: Acute infarct of the left caudate tail. No hemorrhage or mass effect. Electronically Signed   By: Ulyses Jarred M.D.   On: 08/16/2021 19:15    PHYSICAL EXAM  Temp:  [97.4 F (36.3 C)-98.7 F (37.1 C)] 97.8 F (36.6 C) (01/29 0851) Pulse Rate:  [106-138] 106 (01/29 0851) Resp:  [16-18] 16 (01/29 0851) BP: (93-159)/(41-84) 109/67 (01/29 0851) SpO2:  [93 %-98 %] 96 % (01/29 0851)  General - Well nourished, well developed, in no acute distress.  Cardiovascular - Regular rhythm and rate.  Mental Status -  Level of arousal and orientation to time, place, and person were intact. Language including expression, naming, repetition, comprehension was assessed and found intact. Attention span and concentration were normal. Recent and remote memory were intact. Fund of Knowledge was assessed and was intact.  Cranial Nerves II - XII - II -  Right homonymous hemianopia  III, IV, VI - Extraocular movements intact. V - Facial sensation intact bilaterally. VII - Facial movement intact bilaterally. VIII - Hearing & vestibular intact bilaterally. X - Palate elevates symmetrically. XI - Chin turning &  shoulder shrug intact bilaterally. XII - Tongue protrusion intact.  Motor Strength - Mild drift in RLE. Otherwise remainder of exam was wnl  Bulk was normal and fasciculations were absent.   Motor Tone - Muscle tone was assessed at the neck and appendages and was normal. Right side drift in RLE   Reflexes - The patients reflexes were symmetrical in all extremities and she had no pathological reflexes.  Sensory -right upper and lower leg  diminished sensation Neuropathy at baseline   Coordination - The patient had normal movements in the hands and feet with no ataxia or dysmetria.  Tremor was absent.  Gait and Station - deferred.  ASSESSMENT/PLAN [name] [name] is a 61 y.o. female with history of chronic pain, uncontrolled T2DM, adrenal insufficiency, HTN, orthostatic hypotension presenting with right visual defect and dizziness.   Stroke: left caudate tail infarct likely 2/2 small vessel disease Code Stroke CT head. No acute abnormality.   CTA head & neck No emergent vascular findings MRI - left caudate tail infarct 2D Echo EF 60-65% LDL 109 HgbA1c >15.5 VTE prophylaxis - lovenox    Diet   Diet heart healthy/carb modified Room service appropriate? Yes; Fluid consistency: Thin   No antithrombotic prior to admission, now on aspirin 81 mg daily and clopidogrel 75 mg daily.  Therapy recommendations:  pending Disposition:  CIR Per [facility] statutes, patients with hemianopia are not allowed to drive until they have been appropriately rehabilitated. Recommended patient use caution when using heavy equipment or power tools. Recommended patient avoid working on ladders or at heights.   Hyperlipidemia Home meds:  none,  LDL 109, goal < 70 Add high intensity statin  Continue statin at discharge  Diabetes type II Uncontrolled Home meds:  insulin HgbA1c >15.5, goal < 7.0 CBGs Recent Labs    08/16/21 1520 08/16/21 2134 08/17/21 0603  GLUCAP 357* 367* 227*    SSI  Other Stroke Risk Factors Former Cigarette smoker  Other Active Problems Orthostatic hypotension MDD Adrenal insufficency   Hospital day # 0  France Ravens, MD PGY1 Resident  ATTENDING ATTESTATION:  61 year old with chronic pain uncontrolled diabetes, hemoglobin A1c greater than 15.  Adrenal insufficiency, hypertension.  Presented with right visual field deficit dizziness over several days.  Output of the window for intervention.  MRI  shows left caudate tail CVA.  Echo is negative LDL 109.  On exam she has right homonymous hemianopia and diminished sensory loss on the right side.  On DAPT therapy x3 weeks then aspirin alone.  Discussed with her that despite recently purchasing a car that she cannot drive.  I instructed psych resident, Dr. Lurline Hare to contact [facility] to restrict her license until cleared by neurology outpatient.  Dr. Reeves Forth evaluated pt independently, reviewed imaging, chart, labs. Discussed and formulated plan with the APP. Please see APP note above for details.   Total 36 minutes spent on counseling patient and coordinating care, writing notes and reviewing chart.   Darolyn Double,MD   To contact Stroke Continuity provider, please refer to [email or web address] After hours, contact General Neurology

## 2021-08-18 DIAGNOSIS — I951 Orthostatic hypotension: Secondary | ICD-10-CM | POA: Diagnosis not present

## 2021-08-18 LAB — CBC WITH DIFFERENTIAL/PLATELET
Abs Immature Granulocytes: 0.02 10*3/uL (ref 0.00–0.07)
Basophils Absolute: 0 10*3/uL (ref 0.0–0.1)
Basophils Relative: 0 %
Eosinophils Absolute: 0.2 10*3/uL (ref 0.0–0.5)
Eosinophils Relative: 4 %
HCT: 33.6 % — ABNORMAL LOW (ref 36.0–46.0)
Hemoglobin: 11.3 g/dL — ABNORMAL LOW (ref 12.0–15.0)
Immature Granulocytes: 0 %
Lymphocytes Relative: 47 %
Lymphs Abs: 2.4 10*3/uL (ref 0.7–4.0)
MCH: 31 pg (ref 26.0–34.0)
MCHC: 33.6 g/dL (ref 30.0–36.0)
MCV: 92.3 fL (ref 80.0–100.0)
Monocytes Absolute: 0.4 10*3/uL (ref 0.1–1.0)
Monocytes Relative: 8 %
Neutro Abs: 2.2 10*3/uL (ref 1.7–7.7)
Neutrophils Relative %: 41 %
Platelets: 187 10*3/uL (ref 150–400)
RBC: 3.64 MIL/uL — ABNORMAL LOW (ref 3.87–5.11)
RDW: 12.2 % (ref 11.5–15.5)
WBC: 5.2 10*3/uL (ref 4.0–10.5)
nRBC: 0 % (ref 0.0–0.2)

## 2021-08-18 LAB — BASIC METABOLIC PANEL
Anion gap: 7 (ref 5–15)
BUN: 12 mg/dL (ref 8–23)
CO2: 25 mmol/L (ref 22–32)
Calcium: 8.4 mg/dL — ABNORMAL LOW (ref 8.9–10.3)
Chloride: 105 mmol/L (ref 98–111)
Creatinine, Ser: 0.49 mg/dL (ref 0.44–1.00)
GFR, Estimated: >60 mL/min (ref >60)
Glucose, Bld: 293 mg/dL — ABNORMAL HIGH (ref 70–99)
Potassium: 3.6 mmol/L (ref 3.5–5.1)
Sodium: 137 mmol/L (ref 135–145)

## 2021-08-18 LAB — LIPID PANEL
Cholesterol: 162 mg/dL (ref 0–200)
HDL: 34 mg/dL — ABNORMAL LOW (ref >40)
LDL Cholesterol: 106 mg/dL — ABNORMAL HIGH (ref 0–99)
Total CHOL/HDL Ratio: 4.8 RATIO
Triglycerides: 109 mg/dL (ref <150)
VLDL: 22 mg/dL (ref 0–40)

## 2021-08-18 LAB — GLUCOSE, CAPILLARY
Glucose-Capillary: 211 mg/dL — ABNORMAL HIGH (ref 70–99)
Glucose-Capillary: 181 mg/dL — ABNORMAL HIGH (ref 70–99)
Glucose-Capillary: 246 mg/dL — ABNORMAL HIGH (ref 70–99)
Glucose-Capillary: 148 mg/dL — ABNORMAL HIGH (ref 70–99)

## 2021-08-18 LAB — RAPID URINE DRUG SCREEN, HOSP PERFORMED
Amphetamines: NOT DETECTED
Barbiturates: NOT DETECTED
Benzodiazepines: NOT DETECTED
Cocaine: NOT DETECTED
Opiates: NOT DETECTED
Tetrahydrocannabinol: POSITIVE — AB

## 2021-08-18 MED ORDER — LIVING WELL WITH DIABETES BOOK
Freq: Once | Status: AC
  Administered 2021-08-18: 1
  Filled 2021-08-18: qty 1

## 2021-08-18 MED ORDER — CEFDINIR 300 MG PO CAPS
300.0000 mg | ORAL_CAPSULE | Freq: Two times a day (BID) | ORAL | Status: DC
  Administered 2021-08-19 – 2021-08-20 (×3): 300 mg via ORAL
  Filled 2021-08-18 (×4): qty 1

## 2021-08-18 MED ORDER — INSULIN GLARGINE-YFGN 100 UNIT/ML ~~LOC~~ SOLN
55.0000 [IU] | Freq: Every day | SUBCUTANEOUS | Status: DC
  Administered 2021-08-18 – 2021-08-19 (×2): 55 [IU] via SUBCUTANEOUS
  Filled 2021-08-18 (×4): qty 0.55

## 2021-08-18 MED ORDER — INSULIN ASPART 100 UNIT/ML IJ SOLN
6.0000 [IU] | Freq: Three times a day (TID) | INTRAMUSCULAR | Status: DC
  Administered 2021-08-18 – 2021-08-20 (×5): 6 [IU] via SUBCUTANEOUS

## 2021-08-18 NOTE — Progress Notes (Signed)
PROGRESS NOTE    [name] [name]  GXQ:[id number] DOB: July 04, 1961 DOA: 08/16/2021 PCP: Charlott Rakes, MD   Chief Complain: Dizziness  Brief Narrative: Patient is a 61 year old female with history of chronic pain syndrome, uncontrolled diabetes with A1c more than 15, adrenal insufficiency, orthostatic hypotension secondary to autonomic dysfunction who presented with dizziness, blurred vision.  Symptoms started 4 days before admission.  As per the report, she was noncompliant with her medications.  On presentation orthostatic vital signs were positive.  Patient was complaining of bilateral blurry vision so ophthalmology consulted.  CT imagings showed age-indeterminate lacunar infarct, no evidence of large vessel occlusion, cervical carotid atherosclerosis.  Chest x-ray showed medial right lower lobe pneumonia.  MRI showed  left caudate tail acute infarct.  Neurology consulted and following.   Assessment & Plan:   Principal Problem:   Orthostatic hypotension Active Problems:   Uncontrolled type 2 diabetes mellitus with hyperglycemia, with long-term current use of insulin (HCC)   Current severe episode of major depressive disorder without psychotic features (HCC)   Visual field constriction of right eye   DNR (do not resuscitate)   Acute ischemic stroke: MRI of the brain showed left caudate tail acute infarct.  Presented with dizziness, blurry vision.  Stroke work-up initiated. PT/OT/speech evaluation requested.  CT angio head and neck showed no emergent vascular finding,cervical carotid atherosclerosis with up to 35% ICA narrowing on the left. Follow-up LDL, hemoglobin A1c of 13.3.Echo showed EF of 60 to 14%, grade 1 diastolic function, No intracardiac source of emboli. She has right sided weakness, still has blurry vision on the right side / loss of lateral visual field on the right. PT/OT recommending CIR on discharge  Right-sided pneumonia: Chest x-ray showed medial right lower lobe  pneumonia.  She is on room air, does not complain of any cough.  Lungs were clear on auscultation .started on ceftriaxone ,azithromycin.  She was previously admitted for pneumonia in December on the same side.  We will continue short course of antibiotics,changed to oral  Chronic orthostatic hypotension: History of autonomic dysfunction, adrenal insufficiency.  Orthostatic vitals were positive.  She is chronically on midodrine.  Fall precautions.    History of adrenal insufficiency: Previously taking hydrocortisone 10 mg daily .but currently she was not taking any most likely secondary to noncompliance.  Her previous notes from PCP clearly says that she should be on hydrocortisone 10 mg daily though it can increase her blood sugars.  She was given a dose of steroid in the emergency department.  Started  her on hydrocortisone 10 mg daily.  Blurry vision: Complained of blurry vision on the right side.  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  Diabetic monitor consulted.  She has history of noncompliance and says that she forgets to take her medications sometimes.  Continue insulin at current dose.  Monitor blood sugars.  A1c of 13.3  Chronic sinus tachycardia: Chronic problem.  Normal TSH.  No room for starting on beta-blocker due to hypotension  Chronic pain syndrome: CT cervical spine showed multilevel degenerative disc disease.  Continue supportive care           DVT prophylaxis:Lovenox Code Status: Full Family Communication:: Discussed with daughter on phone on 1/29 Patient status:Inpatient  Dispo: The patient is from: Home              Anticipated d/c is to: CIR  Anticipated d/c date is: In 1 to 2 days Consultants: Neurology  Procedures: None  Antimicrobials:  Anti-infectives (From admission, onward)    Start     Dose/Rate Route Frequency Ordered Stop   08/19/21 1000   cefdinir (OMNICEF) capsule 300 mg        300 mg Oral Every 12 hours 08/18/21 1100 08/22/21 0959   08/17/21 1000  azithromycin (ZITHROMAX) tablet 500 mg        500 mg Oral Daily 08/17/21 0806 08/20/21 0959   08/17/21 0900  cefTRIAXone (ROCEPHIN) 1 g in sodium chloride 0.9 % 100 mL IVPB  Status:  Discontinued        1 g 200 mL/hr over 30 Minutes Intravenous Every 24 hours 08/17/21 0806 08/18/21 1100       Subjective: Patient seen and examined at the bedside this morning.  Hemodynamically stable.  Comfortable.  Still complains of blurry vision on the right side.  Objective: Vitals:   08/17/21 2000 08/17/21 2322 08/18/21 0400 08/18/21 0820  BP: 124/74 114/63 (!) 108/55 106/68  Pulse: (!) 106 (!) 112 99 88  Resp: 20 16 15 16   Temp: 98.1 F (36.7 C) 98.2 F (36.8 C) 98.1 F (36.7 C) 98.6 F (37 C)  TempSrc: Oral Oral Oral Oral  SpO2: 97% 97% 98% 97%  Weight:      Height:        Intake/Output Summary (Last 24 hours) at 08/18/2021 1101 Last data filed at 08/18/2021 0600 Gross per 24 hour  Intake 2455.2 ml  Output 2000 ml  Net 455.2 ml   Filed Weights   08/16/21 0924  Weight: 63.5 kg    Examination:  General exam: Chronically ill looking, weak, deconditioned, flat affect, apathic HEENT: Blurry vision on the right Respiratory system:  no wheezes or crackles  Cardiovascular system: S1 & S2 heard, RRR.  Gastrointestinal system: Abdomen is nondistended, soft and nontender. Central nervous system: Alert and oriented, mild weakness on the right side Extremities: No edema, no clubbing ,no cyanosis Skin: No rashes, no ulcers,no icterus    Data Reviewed: I have personally reviewed following labs and imaging studies  CBC: Recent Labs  Lab 08/16/21 0218 08/17/21 0106 08/18/21 0227  WBC 6.7 11.1* 5.2  NEUTROABS 4.4  --  2.2  HGB 14.3 11.4* 11.3*  HCT 41.9 34.0* 33.6*  MCV 91.7 92.4 92.3  PLT 262 225 161   Basic Metabolic Panel: Recent Labs  Lab 08/16/21 0218  08/17/21 0106 08/18/21 0227  NA 139 133* 137  K 3.6 3.9 3.6  CL 103 103 105  CO2 26 24 25   GLUCOSE 105* 283* 293*  BUN 12 18 12   CREATININE 0.62 0.68 0.49  CALCIUM 9.4 8.6* 8.4*   GFR: Estimated Creatinine Clearance: 69.1 mL/min (by C-G formula based on SCr of 0.49 mg/dL). Liver Function Tests: Recent Labs  Lab 08/16/21 0218  AST 38  ALT 29  ALKPHOS 104  BILITOT 0.9  PROT 6.6  ALBUMIN 3.4*   No results for input(s): LIPASE, AMYLASE in the last 168 hours. No results for input(s): AMMONIA in the last 168 hours. Coagulation Profile: No results for input(s): INR, PROTIME in the last 168 hours. Cardiac Enzymes: No results for input(s): CKTOTAL, CKMB, CKMBINDEX, TROPONINI in the last 168 hours. BNP (last 3 results) No results for input(s): PROBNP in the last 8760 hours. HbA1C: Recent Labs    08/17/21 0106  HGBA1C 13.3*   CBG: Recent Labs  Lab 08/17/21 0603 08/17/21 1129 08/17/21 1616 08/17/21  2124 08/18/21 0604  GLUCAP 227* 267* 157* 259* 211*   Lipid Profile: Recent Labs    08/18/21 0227  CHOL 162  HDL 34*  LDLCALC 106*  TRIG 109  CHOLHDL 4.8   Thyroid Function Tests: Recent Labs    08/16/21 0438  TSH 1.004   Anemia Panel: No results for input(s): VITAMINB12, FOLATE, FERRITIN, TIBC, IRON, RETICCTPCT in the last 72 hours. Sepsis Labs: No results for input(s): PROCALCITON, LATICACIDVEN in the last 168 hours.  Recent Results (from the past 240 hour(s))  Resp Panel by RT-PCR (Flu A&B, Covid) Nasopharyngeal Swab     Status: None   Collection Time: 08/16/21  2:09 AM   Specimen: Nasopharyngeal Swab; Nasopharyngeal(NP) swabs in vial transport medium  Result Value Ref Range Status   SARS Coronavirus 2 by RT PCR NEGATIVE NEGATIVE Final    Comment: (NOTE) SARS-CoV-2 target nucleic acids are NOT DETECTED.  The SARS-CoV-2 RNA is generally detectable in upper respiratory specimens during the acute phase of infection. The lowest concentration of SARS-CoV-2  viral copies this assay can detect is 138 copies/mL. A negative result does not preclude SARS-Cov-2 infection and should not be used as the sole basis for treatment or other patient management decisions. A negative result may occur with  improper specimen collection/handling, submission of specimen other than nasopharyngeal swab, presence of viral mutation(s) within the areas targeted by this assay, and inadequate number of viral copies(<138 copies/mL). A negative result must be combined with clinical observations, patient history, and epidemiological information. The expected result is Negative.  Fact Sheet for Patients:  EntrepreneurPulse.com.au  Fact Sheet for Healthcare Providers:  IncredibleEmployment.be  This test is no t yet approved or cleared by the [place] FDA and  has been authorized for detection and/or diagnosis of SARS-CoV-2 by FDA under an Emergency Use Authorization (EUA). This EUA will remain  in effect (meaning this test can be used) for the duration of the COVID-19 declaration under Section 564(b)(1) of the Act, 21 U.S.C.section 360bbb-3(b)(1), unless the authorization is terminated  or revoked sooner.       Influenza A by PCR NEGATIVE NEGATIVE Final   Influenza B by PCR NEGATIVE NEGATIVE Final    Comment: (NOTE) The Xpert Xpress SARS-CoV-2/FLU/RSV plus assay is intended as an aid in the diagnosis of influenza from Nasopharyngeal swab specimens and should not be used as a sole basis for treatment. Nasal washings and aspirates are unacceptable for Xpert Xpress SARS-CoV-2/FLU/RSV testing.  Fact Sheet for Patients: EntrepreneurPulse.com.au  Fact Sheet for Healthcare Providers: IncredibleEmployment.be  This test is not yet approved or cleared by the [place] FDA and has been authorized for detection and/or diagnosis of SARS-CoV-2 by FDA under an Emergency Use Authorization  (EUA). This EUA will remain in effect (meaning this test can be used) for the duration of the COVID-19 declaration under Section 564(b)(1) of the Act, 21 U.S.C. section 360bbb-3(b)(1), unless the authorization is terminated or revoked.  Performed at [facility], [place] [address] [place], [place] [address]          Radiology Studies: MR BRAIN WO CONTRAST  Result Date: 08/16/2021 CLINICAL DATA:  Acute neurologic deficit EXAM: MRI HEAD WITHOUT CONTRAST TECHNIQUE: Multiplanar, multiecho pulse sequences of the brain and surrounding structures were obtained without intravenous contrast. COMPARISON:  None. FINDINGS: Brain: There is an acute infarct of the left caudate tail. No other diffusion abnormality. No acute or chronic hemorrhage. There is multifocal hyperintense T2-weighted signal within the white matter. Parenchymal volume and  CSF spaces are normal. The midline structures are normal. Vascular: Major flow voids are preserved. Skull and upper cervical spine: Normal calvarium and skull base. Visualized upper cervical spine and soft tissues are normal. Sinuses/Orbits:No paranasal sinus fluid levels or advanced mucosal thickening. No mastoid or middle ear effusion. Normal orbits. IMPRESSION: Acute infarct of the left caudate tail. No hemorrhage or mass effect. Electronically Signed   By: Ulyses Jarred M.D.   On: 08/16/2021 19:15   ECHOCARDIOGRAM COMPLETE  Result Date: 08/17/2021    ECHOCARDIOGRAM REPORT   Patient Name:   Lehman Prom Date of Exam: 08/17/2021 Medical Rec #:  [id number]     Height:       66.0 in Accession #:    [id number]    Weight:       140.0 lb Date of Birth:  1961-06-13     BSA:          1.719 m Patient Age:    18 years      BP:           104/57 mmHg Patient Gender: F             HR:           106 bpm. Exam Location:  Inpatient Procedure: 2D Echo, Cardiac Doppler and Color Doppler Indications:    Stroke I63.9  History:        Patient has prior history of Echocardiogram  examinations, most                 recent 07/29/2020. Risk Factors:Diabetes and Hypertension.  Sonographer:    Bernadene Person RDCS Referring Phys: [id number] Amaya  1. Left ventricular ejection fraction, by estimation, is 60 to 65%. The left ventricle has normal function. The left ventricle has no regional wall motion abnormalities. Left ventricular diastolic parameters are consistent with Grade I diastolic dysfunction (impaired relaxation).  2. Right ventricular systolic function is normal. The right ventricular size is normal. There is normal pulmonary artery systolic pressure. The estimated right ventricular systolic pressure is 62.6 mmHg.  3. The mitral valve is normal in structure. No evidence of mitral valve regurgitation. No evidence of mitral stenosis.  4. The aortic valve is normal in structure. Aortic valve regurgitation is not visualized. No aortic stenosis is present.  5. The inferior vena cava is normal in size with greater than 50% respiratory variability, suggesting right atrial pressure of 3 mmHg. Comparison(s): No significant change from prior study. Prior images reviewed side by side. FINDINGS  Left Ventricle: Left ventricular ejection fraction, by estimation, is 60 to 65%. The left ventricle has normal function. The left ventricle has no regional wall motion abnormalities. The left ventricular internal cavity size was normal in size. There is  no left ventricular hypertrophy. Left ventricular diastolic parameters are consistent with Grade I diastolic dysfunction (impaired relaxation). Right Ventricle: The right ventricular size is normal. No increase in right ventricular wall thickness. Right ventricular systolic function is normal. There is normal pulmonary artery systolic pressure. The tricuspid regurgitant velocity is 2.33 m/s, and  with an assumed right atrial pressure of 3 mmHg, the estimated right ventricular systolic pressure is 94.8 mmHg. Left Atrium: Left atrial size  was normal in size. Right Atrium: Right atrial size was normal in size. Pericardium: There is no evidence of pericardial effusion. Mitral Valve: The mitral valve is normal in structure. No evidence of mitral valve regurgitation. No evidence of mitral valve stenosis. Tricuspid Valve: The tricuspid valve  is normal in structure. Tricuspid valve regurgitation is mild . No evidence of tricuspid stenosis. Aortic Valve: The aortic valve is normal in structure. Aortic valve regurgitation is not visualized. No aortic stenosis is present. Pulmonic Valve: The pulmonic valve was normal in structure. Pulmonic valve regurgitation is not visualized. No evidence of pulmonic stenosis. Aorta: The aortic root is normal in size and structure. Venous: The inferior vena cava is normal in size with greater than 50% respiratory variability, suggesting right atrial pressure of 3 mmHg. IAS/Shunts: No atrial level shunt detected by color flow Doppler.  LEFT VENTRICLE PLAX 2D LVIDd:         3.40 cm     Diastology LVIDs:         2.20 cm     LV e' medial:    6.20 cm/s LV PW:         0.90 cm     LV E/e' medial:  13.8 LV IVS:        0.70 cm     LV e' lateral:   5.96 cm/s LVOT diam:     1.90 cm     LV E/e' lateral: 14.4 LV SV:         43 LV SV Index:   25 LVOT Area:     2.84 cm  LV Volumes (MOD) LV vol d, MOD A2C: 46.6 ml LV vol d, MOD A4C: 62.3 ml LV vol s, MOD A2C: 18.4 ml LV vol s, MOD A4C: 27.7 ml LV SV MOD A2C:     28.2 ml LV SV MOD A4C:     62.3 ml LV SV MOD BP:      30.7 ml RIGHT VENTRICLE RV S prime:     12.70 cm/s TAPSE (M-mode): 2.0 cm LEFT ATRIUM             Index        RIGHT ATRIUM          Index LA diam:        2.60 cm 1.51 cm/m   RA Area:     8.96 cm LA Vol (A2C):   28.5 ml 16.58 ml/m  RA Volume:   16.80 ml 9.78 ml/m LA Vol (A4C):   30.7 ml 17.86 ml/m LA Biplane Vol: 32.3 ml 18.80 ml/m  AORTIC VALVE LVOT Vmax:   96.80 cm/s LVOT Vmean:  64.000 cm/s LVOT VTI:    0.152 m  AORTA Ao Root diam: 2.80 cm Ao Asc diam:  3.10 cm MITRAL  VALVE                TRICUSPID VALVE MV Area (PHT): 5.66 cm     TR Peak grad:   21.7 mmHg MV Decel Time: 134 msec     TR Vmax:        233.00 cm/s MV E velocity: 85.70 cm/s MV A velocity: 103.00 cm/s  SHUNTS MV E/A ratio:  0.83         Systemic VTI:  0.15 m                             Systemic Diam: 1.90 cm Mihai Croitoru MD Electronically signed by Sanda Klein MD Signature Date/Time: 08/17/2021/11:30:18 AM    Final         Scheduled Meds:  aspirin  325 mg Oral Daily   atorvastatin  40 mg Oral Daily   azithromycin  500 mg Oral Daily   [START ON  08/19/2021] cefdinir  300 mg Oral Q12H   docusate sodium  100 mg Oral BID   DULoxetine  60 mg Oral BID   enoxaparin (LOVENOX) injection  40 mg Subcutaneous Q24H   gabapentin  1,200 mg Oral QHS   gabapentin  600 mg Oral BID   hydrocortisone  10 mg Oral Daily   insulin aspart  0-15 Units Subcutaneous TID WC   insulin aspart  0-5 Units Subcutaneous QHS   insulin aspart  6 Units Subcutaneous TID WC   insulin glargine-yfgn  55 Units Subcutaneous QHS   midodrine  10 mg Oral TID WC   sodium chloride flush  3 mL Intravenous Q12H   Continuous Infusions:     LOS: 1 day    Time spent: More than 50% of that time was spent in counseling and/or coordination of care.      Shelly Coss, MD [facility] P1/30/2023, 11:01 AM

## 2021-08-18 NOTE — Progress Notes (Addendum)
Inpatient Rehabilitation Admissions Coordinator   I met at bedside with patient and then spoke with her daughter, Caesar Chestnut by phone. We discussed goals and expectations of a possible CIR admit. They prefer Cir before returning home with her roommate. Patient could benefit and I await medical work up completion before admitting to CIR. They are in agreement. I have begun Auth with [facility] Medicaid for admit to CIR.  Danne Baxter, RN, MSN Rehab Admissions Coordinator [phone] 08/18/2021 12:31 PM

## 2021-08-18 NOTE — Progress Notes (Signed)
Inpatient Rehab Admissions Coordinator:  ? ?Per therapy recommendations,  patient was screened for CIR candidacy by Sugey Trevathan, MS, CCC-SLP. At this time, Pt. Appears to be a a potential candidate for CIR. I will place   order for rehab consult per protocol for full assessment. Please contact me any with questions. ? ?Taci Sterling, MS, CCC-SLP ?Rehab Admissions Coordinator  ?[phone] (celll) ?[phone] (office) ? ?

## 2021-08-18 NOTE — Evaluation (Signed)
Physical Therapy Evaluation Patient Details Name: [name] [name] MRN: [id number] DOB: Mar 15, 1961 Today's Date: 08/18/2021  History of Present Illness  [name] [name] is a 61 y.o. female who presents with right sided visual field deficits and dizziness. CT (+) lacunar infarct at the left caudate body. Pt with a history of chronic pain, uncontrolled DM (with a recent episode of DKA and A1C >15), HTN, adrenal insufficiency and orthostatic hypotension  Clinical Impression  Pt admitted with/for stroke on L caudate tail.  Pt needing min assist for mobility and cuing for initiation and motivation.  Pt currently limited functionally due to the problems listed. ( See problems list.)   Pt will benefit from PT to maximize function and safety in order to get ready for next venue listed below.        Recommendations for follow up therapy are one component of a multi-disciplinary discharge planning process, led by the attending physician.  Recommendations may be updated based on patient status, additional functional criteria and insurance authorization.  Follow Up Recommendations Acute inpatient rehab (3hours/day)    Assistance Recommended at Discharge Frequent or constant Supervision/Assistance  Patient can return home with the following  A little help with walking and/or transfers;A little help with bathing/dressing/bathroom;Assistance with cooking/housework;Direct supervision/assist for medications management;Direct supervision/assist for financial management;Assist for transportation;Help with stairs or ramp for entrance    Equipment Recommendations Other (comment) (TBA)  Recommendations for Other Services  Rehab consult    Functional Status Assessment Patient has had a recent decline in their functional status and demonstrates the ability to make significant improvements in function in a reasonable and predictable amount of time.     Precautions / Restrictions Precautions Precautions:  Fall Precaution Comments: Right homonymous hemianopia      Mobility  Bed Mobility Overal bed mobility: Needs Assistance Bed Mobility: Supine to Sit, Sit to Supine     Supine to sit: Supervision Sit to supine: Supervision   General bed mobility comments: cues only    Transfers Overall transfer level: Needs assistance Equipment used: 1 person hand held assist Transfers: Sit to/from Stand Sit to Stand: Min assist           General transfer comment: light min steady assist, cues for initiation.    Ambulation/Gait Ambulation/Gait assistance: Min assist Gait Distance (Feet): 70 Feet Assistive device: IV Pole Gait Pattern/deviations: Step-through pattern Gait velocity: slower Gait velocity interpretation: <1.8 ft/sec, indicate of risk for recurrent falls   General Gait Details: slow, deliberate gait, fairly normalized.  Again, appeared to need the physical cues to begin movement, IV pole to initiate gait as it initially she needed to follow the pole.  Stairs            Wheelchair Mobility    Modified Rankin (Stroke Patients Only) Modified Rankin (Stroke Patients Only) Pre-Morbid Rankin Score: No symptoms Modified Rankin: Moderately severe disability     Balance Overall balance assessment: Needs assistance Sitting-balance support: Feet supported Sitting balance-Leahy Scale: Fair     Standing balance support: Single extremity supported Standing balance-Leahy Scale: Fair Standing balance comment: appears to prefer something to steady herself.                             Pertinent Vitals/Pain Pain Assessment Pain Assessment: Faces Faces Pain Scale: No hurt Pain Intervention(s): Monitored during session    Home Living Family/patient expects to be discharged to:: Private residence Living Arrangements: Other (Comment) (friend) Available Help  at Discharge: Friend(s);Available PRN/intermittently Type of Home: House Home Access: Stairs to  enter   Entrance Stairs-Number of Steps: 1   Home Layout: One level Home Equipment: Conservation officer, nature (2 wheels);Cane - single point;Shower seat;Grab bars - tub/shower Additional Comments: pt states her friend cannot assist her "She is in as bad of shape as I am."    Prior Function Prior Level of Function : Independent/Modified Independent             Mobility Comments: usd SPC ADLs Comments: indep     Hand Dominance   Dominant Hand: Right    Extremity/Trunk Assessment   Upper Extremity Assessment Upper Extremity Assessment: Defer to OT evaluation (R still a little slowed in response.)    Lower Extremity Assessment Lower Extremity Assessment: RLE deficits/detail;LLE deficits/detail RLE Deficits / Details: difficult to elicit responses to command for MMT  resorted to mobility to test strength.  Pt able to stand with minimal stability assist, same with gait, but pt appeared to need multimodal cuing for execution and general "cheering" her on to get responses. RLE Coordination: decreased fine motor LLE Coordination: decreased fine motor    Cervical / Trunk Assessment Cervical / Trunk Assessment: Normal  Communication   Communication: No difficulties  Cognition Arousal/Alertness: Awake/alert Behavior During Therapy: Flat affect Overall Cognitive Status: Impaired/Different from baseline Area of Impairment: Following commands, Safety/judgement, Awareness                       Following Commands: Follows one step commands with increased time Safety/Judgement: Decreased awareness of deficits Awareness: Emergent   General Comments: pt extremely flat throughout the session. Follows one step commands with increased time.        General Comments General comments (skin integrity, edema, etc.): vss on RA.  Again multiple interruptions making pt on edge.  Pt very flat overall, needing external encouragement to do anything.    Exercises     Assessment/Plan    PT  Assessment Patient needs continued PT services  PT Problem List Decreased strength;Decreased activity tolerance;Decreased mobility;Decreased coordination;Decreased cognition;Decreased knowledge of use of DME;Decreased balance       PT Treatment Interventions Gait training;DME instruction;Functional mobility training;Therapeutic activities;Balance training;Neuromuscular re-education;Patient/family education    PT Goals (Current goals can be found in the Care Plan section)  Acute Rehab PT Goals Patient Stated Goal: pt did not participate with goals, no response to working toward independence and back home. PT Goal Formulation: With patient Time For Goal Achievement: 09/01/21 Potential to Achieve Goals: Good    Frequency Min 4X/week     Co-evaluation               AM-PAC PT "6 Clicks" Mobility  Outcome Measure Help needed turning from your back to your side while in a flat bed without using bedrails?: A Little Help needed moving from lying on your back to sitting on the side of a flat bed without using bedrails?: A Little Help needed moving to and from a bed to a chair (including a wheelchair)?: A Little Help needed standing up from a chair using your arms (e.g., wheelchair or bedside chair)?: A Little Help needed to walk in hospital room?: A Little Help needed climbing 3-5 steps with a railing? : A Lot 6 Click Score: 17    End of Session   Activity Tolerance: Patient tolerated treatment well Patient left: in bed;with call bell/phone within reach;with bed alarm set Nurse Communication: Mobility status PT Visit Diagnosis: Other  abnormalities of gait and mobility (R26.89);Difficulty in walking, not elsewhere classified (R26.2);Other symptoms and signs involving the nervous system (R29.898)    Time: 5913-6859 PT Time Calculation (min) (ACUTE ONLY): 22 min   Charges:   PT Evaluation $PT Eval Moderate Complexity: 1 Mod          08/18/2021  Ginger Carne., PT Acute  Rehabilitation Services [phone]  (pager) [phone]  (office)  Tessie Fass Gustabo Gordillo 08/18/2021, 11:01 AM

## 2021-08-18 NOTE — Progress Notes (Addendum)
Inpatient Diabetes Program Recommendations  AACE/ADA: New Consensus Statement on Inpatient Glycemic Control (2015)  Target Ranges:  Prepandial:   less than 140 mg/dL      Peak postprandial:   less than 180 mg/dL (1-2 hours)      Critically ill patients:  140 - 180 mg/dL   Lab Results  Component Value Date   GLUCAP 211 (H) 08/18/2021   HGBA1C 13.3 (H) 08/17/2021    Review of Glycemic Control  Latest Reference Range & Units 08/17/21 06:03 08/17/21 11:29 08/17/21 16:16 08/17/21 21:24 08/18/21 06:04  Glucose-Capillary 70 - 99 mg/dL 227 (H) 267 (H) 157 (H) 259 (H) 211 (H)  (H): Data is abnormally high  Home DM medications: Lantus 50 units QHS, Humalog 0-12 units TID Current orders for Inpatient glycemic control: Semglee 50 units QHS, Novolog 0-15 units tID and 0-5 units QHS, 5 units mctid, Cortef 10 mg QD  Inpatient Diabetes Program Recommendations:    Semglee 55 units QHS Novolog 6 units tid with meals if eats at least 50%  Addendum @ 12:00: Spoke with patient at bedside.  She is has a flat affect and states she is upset about her recent stroke.  She lives with a roommate.  Asked about home DM regimen.  She  States she does not take her insulins as prescribed.  States, "I miss more doses than I take but now I realize I need to take my insulin".  Denies difficulties obtaining insulins.  They have a zero dollar co-pay.  She does not check her blood sugar at home but does have a functioning glucometer.  ASked her to start check fasting and before bedtime.  Encouraged close follow up with PCP.  She would like to see an endocrinologist.  Will provide her with a list.  Ordered LWWD booklet.  Discussed long and short term complications of uncontrolled glucose.    Reviewed patient's current A1c of 13.3% (average blood sugar of 335 mg/dL). Explained what a A1c is and what it measures. Also reviewed goal A1c with patient,  and again, importance of good glucose control @ home, and blood sugar  goals.  Asked if she would be interested in a [employer].  She smiled and said she would really like that.  Will ask MD closer to [place] if our team can apply one at [place].   Will continue to follow while inpatient.  Thank you, Reche Dixon, MSN, RN Diabetes Coordinator Inpatient Diabetes Program [phone] (team pager from 8a-5p)      Will continue to follow while inpatient.  Thank you, Reche Dixon, MSN, RN Diabetes Coordinator Inpatient Diabetes Program [phone] (team pager from 8a-5p)

## 2021-08-18 NOTE — Progress Notes (Addendum)
Nutrition Brief Note  RD consulted for "nutritional goals."   Admitting Dx: Orthostatic hypotension [I95.1] Dizziness [R42] Tachycardia [R00.0] Blurry vision [H53.8] PMH:  Past Medical History:  Diagnosis Date   Cervical disc disorder with radiculopathy of cervical region    Chronic pain syndrome    Degenerative joint disease    Depression dx 1997   Diabetes mellitus, type 2 ([place]) 2011   No insulin   Diabetic polyneuropathy (HCC)    GERD (gastroesophageal reflux disease)    Glaucoma    Headache(784.0)    Hypertension    Lab 11/2011:  CXR, EKG, CBC, TSH, BMet, troponin-normal; lipid profile: 188, 131, 36, 126    Lumbar herniated disc    Non-compliance    Pancreatic cyst    Endoscopic aspiration in 09/2009   Pneumonia 08/02/2012   Shingles    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   Torn meniscus    Vertigo     Medications:  Scheduled Meds:  aspirin  325 mg Oral Daily   atorvastatin  40 mg Oral Daily   azithromycin  500 mg Oral Daily   docusate sodium  100 mg Oral BID   DULoxetine  60 mg Oral BID   enoxaparin (LOVENOX) injection  40 mg Subcutaneous Q24H   gabapentin  1,200 mg Oral QHS   gabapentin  600 mg Oral BID   hydrocortisone  10 mg Oral Daily   insulin aspart  0-15 Units Subcutaneous TID WC   insulin aspart  0-5 Units Subcutaneous QHS   insulin aspart  5 Units Subcutaneous TID WC   insulin glargine-yfgn  50 Units Subcutaneous QHS   living well with diabetes book   Does not apply Once   midodrine  10 mg Oral TID WC   sodium chloride flush  3 mL Intravenous Q12H  Continuous Infusions:  cefTRIAXone (ROCEPHIN)  IV 1 g (08/18/21 0844)   lactated ringers 75 mL/hr at 08/18/21 0600    Labs: Recent Labs  Lab 08/16/21 0218 08/17/21 0106 08/18/21 0227  NA 139 133* 137  K 3.6 3.9 3.6  CL 103 103 105  CO2 26 24 25   BUN 12 18 12   CREATININE 0.62 0.68 0.49  CALCIUM 9.4 8.6* 8.4*  GLUCOSE 105* 283* 293*  CBGs: [phone]  Wt Readings from Last  15 Encounters:  08/16/21 63.5 kg  06/24/21 63.6 kg  06/17/21 60.8 kg  11/25/20 61.5 kg  10/14/20 64.7 kg  08/12/20 60.4 kg  06/02/20 63.5 kg  04/20/20 63.5 kg  03/24/20 63.5 kg  02/24/20 63.5 kg  12/08/19 61.5 kg  09/18/19 62.8 kg  09/07/19 63.3 kg  08/09/19 62.8 kg  07/02/19 65.4 kg    Body mass index is 22.6 kg/m. Patient meets criteria for normal based on current BMI.   Current diet order is heart healthy/carb modified, patient is consuming approximately 100% of meals at this time.   No nutrition interventions warranted at this time. If nutrition issues arise, please consult RD.    Theone Stanley., MS, RD, LDN (she/her/hers) RD pager number and weekend/on-call pager number located in [place].

## 2021-08-18 NOTE — Progress Notes (Addendum)
STROKE TEAM PROGRESS NOTE   INTERVAL HISTORY Patient continues to have blurred vision  but has some improvement. Patient denies any acute emergent neurological deficits at this time. Neuro exam largely unchanged from yesterday.  UDS positive for THC. Patient anticipated to go to CIR for rehabilitation.  Vitals:   08/17/21 2322 08/18/21 0400 08/18/21 0820 08/18/21 1142  BP: 114/63 (!) 108/55 106/68 (!) 149/87  Pulse: (!) 112 99 88 83  Resp: 16 15 16 16   Temp: 98.2 F (36.8 C) 98.1 F (36.7 C) 98.6 F (37 C) 98.2 F (36.8 C)  TempSrc: Oral Oral Oral Oral  SpO2: 97% 98% 97% 98%  Weight:      Height:       CBC:  Recent Labs  Lab 08/16/21 0218 08/17/21 0106 08/18/21 0227  WBC 6.7 11.1* 5.2  NEUTROABS 4.4  --  2.2  HGB 14.3 11.4* 11.3*  HCT 41.9 34.0* 33.6*  MCV 91.7 92.4 92.3  PLT 262 225 702    Basic Metabolic Panel:  Recent Labs  Lab 08/17/21 0106 08/18/21 0227  NA 133* 137  K 3.9 3.6  CL 103 105  CO2 24 25  GLUCOSE 283* 293*  BUN 18 12  CREATININE 0.68 0.49  CALCIUM 8.6* 8.4*    Lipid Panel:  Recent Labs  Lab 08/18/21 0227  CHOL 162  TRIG 109  HDL 34*  CHOLHDL 4.8  VLDL 22  LDLCALC 106*   HgbA1c:  Recent Labs  Lab 08/17/21 0106  HGBA1C 13.3*    Urine Drug Screen: No results for input(s): LABOPIA, COCAINSCRNUR, LABBENZ, AMPHETMU, THCU, LABBARB in the last 168 hours.  Alcohol Level No results for input(s): ETH in the last 168 hours.  IMAGING past 24 hours No results found.  PHYSICAL EXAM  Temp:  [98.1 F (36.7 C)-98.6 F (37 C)] 98.2 F (36.8 C) (01/30 1142) Pulse Rate:  [83-112] 83 (01/30 1142) Resp:  [15-20] 16 (01/30 1142) BP: (106-149)/(55-87) 149/87 (01/30 1142) SpO2:  [97 %-98 %] 98 % (01/30 1142)  General - Well nourished, well developed middle-aged Caucasian lady, in no acute distress.  Cardiovascular - Regular rhythm and rate.  Mental Status -  Level of arousal and orientation to time, place, and person were  intact. Language including expression, naming, repetition, comprehension was assessed and found intact. Attention span and concentration were normal. Recent and remote memory were intact. Fund of Knowledge was assessed and was intact.  Cranial Nerves II - XII - II -suspect right homonymous hemianopia though she is inconsistent on repeat testing. III, IV, VI - Extraocular movements intact. V - Facial sensation intact bilaterally. VII - Facial movement intact bilaterally. VIII - Hearing & vestibular intact bilaterally. X - Palate elevates symmetrically. XI - Chin turning & shoulder shrug intact bilaterally. XII - Tongue protrusion intact.  Motor Strength - Mild drift in RLE. Otherwise remainder of exam was wnl  Bulk was normal and fasciculations were absent.   Motor Tone - Muscle tone was assessed at the neck and appendages and was normal. Right side drift in RLE   Reflexes - The patients reflexes were symmetrical in all extremities and she had no pathological reflexes.  Sensory -right upper and lower leg diminished sensation Neuropathy at baseline   Coordination - The patient had normal movements in the hands and feet with no ataxia or dysmetria.  Tremor was absent.  Gait and Station - deferred.  ASSESSMENT/PLAN [name] [name] is a 61 y.o. female with history of chronic pain,  uncontrolled T2DM, adrenal insufficiency, HTN, orthostatic hypotension presenting with right visual defect and dizziness.   Stroke: left caudate tail infarct likely 2/2 small vessel disease Code Stroke CT head. No acute abnormality.   CTA head & neck No emergent vascular findings MRI - left caudate tail infarct 2D Echo EF 60-65% LDL 109 HgbA1c >15.5 VTE prophylaxis - lovenox    Diet   Diet heart healthy/carb modified Room service appropriate? Yes; Fluid consistency: Thin   No antithrombotic prior to admission, now on aspirin 81 mg daily and clopidogrel 75 mg daily.  Therapy recommendations:   CIR Disposition:  CIR Per [facility] statutes, patients with hemianopia are not allowed to drive until they have been appropriately rehabilitated. Recommended patient use caution when using heavy equipment or power tools. Recommended patient avoid working on ladders or at heights.   Hyperlipidemia Home meds:  none,  LDL 109, goal < 70 On Lipitor 40 Continue statin at discharge  Diabetes type II Uncontrolled Home meds:  insulin HgbA1c 13.3, goal < 7.0 CBGs Recent Labs    08/17/21 2124 08/18/21 0604 08/18/21 1141  GLUCAP 259* 211* 181*     SSI  Other Stroke Risk Factors Former Cigarette smoker Substance Use: UDS positive for THC. Advised to not use substances.  Other Active Problems Orthostatic hypotension MDD Adrenal insufficency   Hospital day # 1  [name] Ravens, MD PGY1 Resident  I have personally obtained history,examined this patient, reviewed notes, independently viewed imaging studies, participated in medical decision making and plan of care.ROS completed by me personally and pertinent positives fully documented  I have made any additions or clarifications directly to the above note. Agree with note above.  Patient presented with vision difficulties which she characterizes as blurred vision both eyes were on exam look appears she has right-sided peripheral vision loss likely due to involvement of the retrolenticular visual fibers.  She was advised not to drive till peripheral vision improves.  She was counseled to quit marijuana and smoking cigarettes.  Recommend dual antiplatelet therapy for 3 weeks followed by aspirin alone and aggressive risk factor modification.  Greater than 50% time during this 50-minute visit was spent in counseling and coordination of care and discussion with patient and care team.  Discussed with Dr. Tawanna Solo.  Follow-up with outpatient stroke clinic in 2 months.  Stroke team will sign off.  Kindly call for questions  [name] Contras,  MD Medical Director [place] Pager: [phone] 08/18/2021 3:39 PM   To contact Stroke Continuity provider, please refer to [email or web address] After hours, contact General Neurology

## 2021-08-19 DIAGNOSIS — I951 Orthostatic hypotension: Secondary | ICD-10-CM | POA: Diagnosis not present

## 2021-08-19 LAB — GLUCOSE, CAPILLARY
Glucose-Capillary: 134 mg/dL — ABNORMAL HIGH (ref 70–99)
Glucose-Capillary: 188 mg/dL — ABNORMAL HIGH (ref 70–99)
Glucose-Capillary: 176 mg/dL — ABNORMAL HIGH (ref 70–99)
Glucose-Capillary: 189 mg/dL — ABNORMAL HIGH (ref 70–99)

## 2021-08-19 MED ORDER — CLOPIDOGREL BISULFATE 75 MG PO TABS
75.0000 mg | ORAL_TABLET | Freq: Every day | ORAL | Status: DC
  Administered 2021-08-19 – 2021-08-20 (×2): 75 mg via ORAL
  Filled 2021-08-19 (×2): qty 1

## 2021-08-19 MED ORDER — ASPIRIN EC 81 MG PO TBEC
81.0000 mg | DELAYED_RELEASE_TABLET | Freq: Every day | ORAL | Status: DC
  Administered 2021-08-20: 81 mg via ORAL
  Filled 2021-08-19 (×2): qty 1

## 2021-08-19 NOTE — H&P (Incomplete)
Physical Medicine and Rehabilitation Admission H&P    Chief Complaint  Patient presents with   Functional deficits due to stroke.     HPI: [name] [name] is a 61 year old female with histoyr of T2DM w/neuropathy, chronic pain, adrenal insufficiency, autonomic dysfunction with orthostasis who was admitted on 08/16/21 with four day history of blurred vision and dizziness. She ws found to have RLL PNA and CTA head/neck was negative for LVO. UDS positive for THC. She was started on ceftriaxone/Zithromax for treatment and was treated with stress dose steroids due to non-compliance. 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 2D echo showed EF 60-65% with no wall abnormality. Dr. Leonie Man felt that stroke was due to small vessel disease and recommended DAPT X 3 weeks followed by ASA alone. R-HH suspected, blurry vision is improving but patient continues to be limited by decreased sensation on right side with decrease in right motor control with slow shuffling gait as well as cognitive deficits. CIR recommended due to and     ROS   Past Medical History:  Diagnosis Date   Cervical disc disorder with radiculopathy of cervical region    Chronic pain syndrome    Degenerative joint disease    Depression dx 1997   Diabetes mellitus, type 2 ([place]) 2011   No insulin   Diabetic polyneuropathy (HCC)    GERD (gastroesophageal reflux disease)    Glaucoma    Headache(784.0)    Hypertension    Lab 11/2011:  CXR, EKG, CBC, TSH, BMet, troponin-normal; lipid profile: 188, 131, 36, 126    Lumbar herniated disc    Non-compliance    Pancreatic cyst    Endoscopic aspiration in 09/2009   Pneumonia 08/02/2012   Shingles    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   Torn meniscus    Vertigo     Past Surgical History:  Procedure Laterality Date   ABDOMINAL HYSTERECTOMY     CESAREAN SECTION     ORIF ANKLE FRACTURE Right 02/25/2020   Procedure: OPEN  REDUCTION INTERNAL FIXATION (ORIF) ANKLE FRACTURE;  Surgeon: Lovell Sheehan, MD;  Location: [facility] ORS;  Service: Orthopedics;  Laterality: Right;    Family History  Problem Relation Age of Onset   Depression Mother        type 1   Diabetes Mother    Renal Disease Mother    Lung cancer Father    Heart attack Brother        Died age 77 - was told due to massive MI/heart "exploded"   Heart failure Maternal Grandmother        Details not totally clear    Social History: Has a roommate who is in poor health?--daughter assists with medication setup.  reports that she quit smoking about 43 years ago. Her smoking use included cigarettes. She has never used smokeless tobacco. She reports that she does not currently use drugs after having used the following drugs: Marijuana. She reports that she does not drink alcohol.    Allergies  Allergen Reactions   Tramadol Nausea And Vomiting    REACTION: Projectile vomiting    Medications Prior to Admission  Medication Sig Dispense Refill   acetaminophen (TYLENOL) 500 MG tablet Take 2 tablets (1,000 mg total) by mouth every 6 (six) hours as needed for mild pain or headache (pain).  0   DULoxetine (CYMBALTA) 60 MG capsule Take 1 capsule (60  mg total) by mouth 2 (two) times daily. 60 capsule 3   gabapentin (NEURONTIN) 600 MG tablet TAKE ONE TABLET (600 MG) BY MOUTH EVERY MORNING AND AFTERNOON, TAKE TWO TABLETS AT NIGHT (Patient taking differently: Take 600 mg by mouth See admin instructions. TAKE ONE TABLET (600 MG) BY MOUTH EVERY MORNING AND AFTERNOON, TAKE TWO TABLETS AT NIGHT) 120 tablet 3   HUMALOG KWIKPEN 100 UNIT/ML KwikPen Inject 0-12 Units into the skin 3 (three) times daily. Per sliding scale 30 mL 3   insulin glargine (LANTUS SOLOSTAR) 100 UNIT/ML Solostar Pen Inject 50 Units into the skin at bedtime. 30 mL 3   midodrine (PROAMATINE) 10 MG tablet Take 1 tablet (10 mg total) by mouth 3 (three) times daily with meals. 90 tablet 3   tiZANidine  (ZANAFLEX) 4 MG tablet Take 4 mg by mouth every 6 (six) hours as needed for muscle spasms.     Accu-Chek Softclix Lancets lancets Use as instructed tid before meals 100 each 12   Blood Glucose Monitoring Suppl (ACCU-CHEK GUIDE) w/Device KIT Use as directed tid 1 kit 0   glucose blood (ACCU-CHEK GUIDE) test strip Use as instructed tid 100 each 12   guaiFENesin-dextromethorphan (ROBITUSSIN DM) 100-10 MG/5ML syrup Take 5 mLs by mouth every 4 (four) hours as needed for cough (chest congestion). (Patient not taking: Reported on 08/16/2021) 118 mL 0   Insulin Pen Needle (PEN NEEDLES) 31G X 8 MM MISC Use as directed 100 each 6    Drug Regimen Review { DRUG REGIMEN HUTMLY:65035}  Home: Home Living Family/patient expects to be discharged to:: Private residence Living Arrangements: Other (Comment) (friend) Available Help at Discharge: Friend(s), Available PRN/intermittently Type of Home: House Home Access: Stairs to enter Technical brewer of Steps: 1 Home Layout: One level Bathroom Shower/Tub: Chiropodist: Standard Home Equipment: Conservation officer, nature (2 wheels), [employer] - single point, Civil engineer, contracting, Grab bars - tub/shower Additional Comments: pt states her friend cannot assist her "She is in as bad of shape as I am."  Lives With: Friend(s)   Functional History: Prior Function Prior Level of Function : Independent/Modified Independent Mobility Comments: usd SPC ADLs Comments: indep  Functional Status:  Mobility: Bed Mobility Overal bed mobility: Needs Assistance Bed Mobility: Supine to Sit Supine to sit: Supervision Sit to supine: Supervision General bed mobility comments: cues only, incresaed time Transfers Overall transfer level: Needs assistance Equipment used: 1 person hand held assist Transfers: Sit to/from Stand Sit to Stand: Min assist General transfer comment: minA with HHA, verbal cues and min guard when standing up from chair to RW, verbal cues for hand  placement, increased time, initally took a couple of attempts Ambulation/Gait Ambulation/Gait assistance: Herbalist (Feet): 120 Feet Assistive device: Rolling walker (2 wheels) Gait Pattern/deviations: Step-through pattern, Decreased stride length, Shuffle General Gait Details: attemptd to amb with R HHA however pt unsteady and reaching with L UE to hold onto something, transitioned to RW, pt with increased stability however continues to brace self on UEs with short step height and length in steps. minA to maintain forward progression of walker to improved fluidity of gait, very slow, minA also to not vear R with RW Gait velocity: slower Gait velocity interpretation: <1.8 ft/sec, indicate of risk for recurrent falls    ADL: ADL Overall ADL's : Needs assistance/impaired Eating/Feeding: Independent, Sitting Grooming: Set up, Sitting Upper Body Bathing: Set up, Sitting Lower Body Bathing: Minimal assistance, Sit to/from stand Upper Body Dressing : Set up, Sitting Lower  Body Dressing: Minimal assistance, Sit to/from stand Lower Body Dressing Details (indicate cue type and reason): able to don socks while sitting EOB Toilet Transfer: Min guard, Stand-pivot, Rolling walker (2 wheels) Toilet Transfer Details (indicate cue type and reason): SP only for safety this session Toileting- Clothing Manipulation and Hygiene: Min guard, Sitting/lateral lean Functional mobility during ADLs: Minimal assistance General ADL Comments: pt limited by overall affect this session, required maximal encouragement for participation and incrased time for all tasks. Pt requires cues for R visual environment  Cognition: Cognition Overall Cognitive Status: Impaired/Different from baseline Arousal/Alertness: Awake/alert Orientation Level: Oriented X4 Attention: Selective Selective Attention: Impaired Selective Attention Impairment: Verbal basic Memory: Impaired Memory Impairment: Storage deficit,  Retrieval deficit, Decreased recall of new information Problem Solving: Impaired Problem Solving Impairment: Verbal complex Cognition Arousal/Alertness: Awake/alert Behavior During Therapy: Flat affect Overall Cognitive Status: Impaired/Different from baseline Area of Impairment: Following commands, Awareness Following Commands: Follows one step commands with increased time Safety/Judgement: Decreased awareness of deficits Awareness: Emergent General Comments: pt very flat affect, depressed spirits, requires max encourgament to continue to participate, followed simple one step commands   Blood pressure 130/80, pulse 100, temperature 97.7 F (36.5 C), temperature source Oral, resp. rate 20, height _0  (1.676 m), weight 63.5 kg, SpO2 98 %. Physical Exam  Results for orders placed or performed during the hospital encounter of 08/16/21 (from the past 48 hour(s))  Glucose, capillary     Status: Abnormal   Collection Time: 08/17/21 11:29 AM  Result Value Ref Range   Glucose-Capillary 267 (H) 70 - 99 mg/dL    Comment: Glucose reference range applies only to samples taken after fasting for at least 8 hours.  Glucose, capillary     Status: Abnormal   Collection Time: 08/17/21  4:16 PM  Result Value Ref Range   Glucose-Capillary 157 (H) 70 - 99 mg/dL    Comment: Glucose reference range applies only to samples taken after fasting for at least 8 hours.  Glucose, capillary     Status: Abnormal   Collection Time: 08/17/21  9:24 PM  Result Value Ref Range   Glucose-Capillary 259 (H) 70 - 99 mg/dL    Comment: Glucose reference range applies only to samples taken after fasting for at least 8 hours.  Lipid panel     Status: Abnormal   Collection Time: 08/18/21  2:27 AM  Result Value Ref Range   Cholesterol 162 0 - 200 mg/dL   Triglycerides 109 <150 mg/dL   HDL 34 (L) >40 mg/dL   Total CHOL/HDL Ratio 4.8 RATIO   VLDL 22 0 - 40 mg/dL   LDL Cholesterol 106 (H) 0 - 99 mg/dL    Comment:         Total Cholesterol/HDL:CHD Risk Coronary Heart Disease Risk Table                     Men   Women  1/2 Average Risk   3.4   3.3  Average Risk       5.0   4.4  2 X Average Risk   9.6   7.1  3 X Average Risk  23.4   11.0        Use the calculated Patient Ratio above and the CHD Risk Table to determine the patient's CHD Risk.        ATP III CLASSIFICATION (LDL):  <100     mg/dL   Optimal  100-129  mg/dL   Near or Above  Optimal  130-159  mg/dL   Borderline  160-189  mg/dL   High  >190     mg/dL   Very High Performed at [facility] [address], [place], [place] [address]   CBC with Differential/Platelet     Status: Abnormal   Collection Time: 08/18/21  2:27 AM  Result Value Ref Range   WBC 5.2 4.0 - 10.5 K/uL   RBC 3.64 (L) 3.87 - 5.11 MIL/uL   Hemoglobin 11.3 (L) 12.0 - 15.0 g/dL   HCT 33.6 (L) 36.0 - 46.0 %   MCV 92.3 80.0 - 100.0 fL   MCH 31.0 26.0 - 34.0 pg   MCHC 33.6 30.0 - 36.0 g/dL   RDW 12.2 11.5 - 15.5 %   Platelets 187 150 - 400 K/uL   nRBC 0.0 0.0 - 0.2 %   Neutrophils Relative % 41 %   Neutro Abs 2.2 1.7 - 7.7 K/uL   Lymphocytes Relative 47 %   Lymphs Abs 2.4 0.7 - 4.0 K/uL   Monocytes Relative 8 %   Monocytes Absolute 0.4 0.1 - 1.0 K/uL   Eosinophils Relative 4 %   Eosinophils Absolute 0.2 0.0 - 0.5 K/uL   Basophils Relative 0 %   Basophils Absolute 0.0 0.0 - 0.1 K/uL   Immature Granulocytes 0 %   Abs Immature Granulocytes 0.02 0.00 - 0.07 K/uL    Comment: Performed at [facility] [address], [place], [place] [address]  Basic metabolic panel     Status: Abnormal   Collection Time: 08/18/21  2:27 AM  Result Value Ref Range   Sodium 137 135 - 145 mmol/L   Potassium 3.6 3.5 - 5.1 mmol/L   Chloride 105 98 - 111 mmol/L   CO2 25 22 - 32 mmol/L   Glucose, Bld 293 (H) 70 - 99 mg/dL    Comment: Glucose reference range applies only to samples taken after fasting for at least 8 hours.   BUN 12 8 - 23 mg/dL   Creatinine,  Ser 0.49 0.44 - 1.00 mg/dL   Calcium 8.4 (L) 8.9 - 10.3 mg/dL   GFR, Estimated >60 >60 mL/min    Comment: (NOTE) Calculated using the CKD-EPI Creatinine Equation (2021)    Anion gap 7 5 - 15    Comment: Performed at [facility] [address], [place], [place] [address]  Glucose, capillary     Status: Abnormal   Collection Time: 08/18/21  6:04 AM  Result Value Ref Range   Glucose-Capillary 211 (H) 70 - 99 mg/dL    Comment: Glucose reference range applies only to samples taken after fasting for at least 8 hours.   Comment 1 Notify RN    Comment 2 Document in Chart   Glucose, capillary     Status: Abnormal   Collection Time: 08/18/21 11:41 AM  Result Value Ref Range   Glucose-Capillary 181 (H) 70 - 99 mg/dL    Comment: Glucose reference range applies only to samples taken after fasting for at least 8 hours.  Urine rapid drug screen (hosp performed)     Status: Abnormal   Collection Time: 08/18/21  1:15 PM  Result Value Ref Range   Opiates NONE DETECTED NONE DETECTED   Cocaine NONE DETECTED NONE DETECTED   Benzodiazepines NONE DETECTED NONE DETECTED   Amphetamines NONE DETECTED NONE DETECTED   Tetrahydrocannabinol POSITIVE (A) NONE DETECTED   Barbiturates NONE DETECTED NONE DETECTED    Comment: (NOTE) DRUG SCREEN  FOR MEDICAL PURPOSES ONLY.  IF CONFIRMATION IS NEEDED FOR ANY PURPOSE, NOTIFY LAB WITHIN 5 DAYS.  LOWEST DETECTABLE LIMITS FOR URINE DRUG SCREEN Drug Class                     Cutoff (ng/mL) Amphetamine and metabolites    1000 Barbiturate and metabolites    200 Benzodiazepine                 825 Tricyclics and metabolites     300 Opiates and metabolites        300 Cocaine and metabolites        300 THC                            50 Performed at [facility], [place] [address] [place], [place] [address]   Glucose, capillary     Status: Abnormal   Collection Time: 08/18/21  4:36 PM  Result Value Ref Range   Glucose-Capillary 246 (H) 70 - 99 mg/dL     Comment: Glucose reference range applies only to samples taken after fasting for at least 8 hours.  Glucose, capillary     Status: Abnormal   Collection Time: 08/18/21 10:04 PM  Result Value Ref Range   Glucose-Capillary 148 (H) 70 - 99 mg/dL    Comment: Glucose reference range applies only to samples taken after fasting for at least 8 hours.   Comment 1 Notify RN    Comment 2 Document in Chart   Glucose, capillary     Status: Abnormal   Collection Time: 08/19/21  6:22 AM  Result Value Ref Range   Glucose-Capillary 134 (H) 70 - 99 mg/dL    Comment: Glucose reference range applies only to samples taken after fasting for at least 8 hours.   Comment 1 Notify RN    Comment 2 Document in Chart    No results found.     Medical Problem List and Plan: 1. Functional deficits secondary to ***  -patient may *** shower  -ELOS/Goals: *** 2.  Antithrombotics: -DVT/anticoagulation:  Pharmaceutical: Lovenox  -antiplatelet therapy: DAPT X 3 weeks followed by ASA alone on  09/08/21 3. Chronic pain/myalgias/Pain Management: Continue cymbalta 60 mg bid w/ gabapentin 600/600/1200  --will continue to hold Tizanidine.  4. Mood: LCSW to follow for evaluation and support.   -antipsychotic agents: N/A 5. Neuropsych: This patient *** capable of making decisions on *** own behalf. 6. Skin/Wound Care: Routine pressure relief measures.  7. Fluids/Electrolytes/Nutrition: Monitor I/O. Check lytes in am.  8. RLL PNA: Treated with ceftriaxone-->Omnicef 3 additional days to complete 5 day course antibiotic regimen  --h/o RLL PNA 11/22  9. Chronic Adrenal insufficiency: now back on hydrocortisone 10 mg daily 10. T2DM with neuropathy/autonomic dysfunction: Hgb A1C-13.3 and poorly controlled but improved from >15.5 at last admission. Continue to educate on compliance.    --continue Insulin glargine 55 units with novolog 6 units TID for meal coverage.  --Monitor BS ac/hs. Continue SSI for elevated BS. Continue to  titrate insulin for tighter BS control.   11. Orthostatic hypotension:  Treated w/1 liter fluid bolus 02/01 for hypotension.  --will order orthostatic vitals.  --continue Midodrine 10 mg TID for BP support.  12. Elevated lipids: Continue lipitor.     ***  Bary Leriche, PA-C 08/19/2021

## 2021-08-19 NOTE — Progress Notes (Signed)
Occupational Therapy Treatment Patient Details Name: [name] [name] MRN: [id number] DOB: 1961/04/16 Today's Date: 08/19/2021   History of present illness [name] [name] is a 61 y.o. female who presents with right sided visual field deficits and dizziness. CT (+) lacunar infarct at the left caudate body. Pt with a history of chronic pain, uncontrolled DM (with a recent episode of DKA and A1C >15), HTN, adrenal insufficiency and orthostatic hypotension   OT comments  [name] [name] is progressing well. She did not require physical assistance for simple transfers or short ambulation with RW, but benefit from verbal cues for safety and R awareness. She also has an extremely slow shuffling gait, with fear of falling/tripping with increased speed or step length despite max encouragement and education. Pt did well with Short-Blessed Assessment this session and will benefit from further cognitive assessment from a functional standpoint. D/c remains appropriate, OT to continue to follow acutely.     Recommendations for follow up therapy are one component of a multi-disciplinary discharge planning process, led by the attending physician.  Recommendations may be updated based on patient status, additional functional criteria and insurance authorization.    Follow Up Recommendations  Acute inpatient rehab (3hours/day)    Assistance Recommended at Discharge Frequent or constant Supervision/Assistance  Patient can return home with the following  A little help with walking and/or transfers;A little help with bathing/dressing/bathroom;Assistance with cooking/housework;Direct supervision/assist for medications management;Assist for transportation;Help with stairs or ramp for entrance   Equipment Recommendations  None recommended by OT    Recommendations for Other Services Rehab consult    Precautions / Restrictions Precautions Precautions: Fall Precaution Comments: Right homonymous hemianopia Restrictions Weight  Bearing Restrictions: No       Mobility Bed Mobility Overal bed mobility: Needs Assistance Bed Mobility: Supine to Sit, Sit to Supine     Supine to sit: Supervision Sit to supine: Supervision   General bed mobility comments: cues only, incresaed time    Transfers Overall transfer level: Needs assistance Equipment used: Rolling walker (2 wheels) Transfers: Sit to/from Stand Sit to Stand: Supervision           General transfer comment: no assist needed, cues only     Balance Overall balance assessment: Needs assistance Sitting-balance support: Feet supported Sitting balance-Leahy Scale: Fair     Standing balance support: No upper extremity supported, During functional activity Standing balance-Leahy Scale: Fair                             ADL either performed or assessed with clinical judgement   ADL Overall ADL's : Needs assistance/impaired     Grooming: Supervision/safety;Standing Grooming Details (indicate cue type and reason): at the sink with increased time                 Toilet Transfer: Supervision/safety;Rolling walker (2 wheels);Ambulation Toilet Transfer Details (indicate cue type and reason): no physical assist required, incrased time required         Functional mobility during ADLs: Supervision/safety;Rolling walker (2 wheels) General ADL Comments: pt requires increased time for all tasks assessed. slow shuffling gait despite cues and max encouragement    Extremity/Trunk Assessment Upper Extremity Assessment Upper Extremity Assessment: RUE deficits/detail RUE Deficits / Details: R still weaker than left, slowed coordination. Using functionally throughout session. ROM is WFL RUE Sensation: WNL RUE Coordination: decreased fine motor   Lower Extremity Assessment Lower Extremity Assessment: Defer to PT evaluation  Vision   Vision Assessment?: Yes Eye Alignment: Within Functional Limits Ocular Range of Motion:  Restricted on the right;Impaired-to be further tested in functional context Alignment/Gaze Preference: Within Defined Limits Tracking/Visual Pursuits: Impaired - to be further tested in functional context Saccades: Additional head turns occurred during testing;Impaired - to be further tested in functional context Convergence: Within functional limits Visual Fields: Right homonymous hemianopsia Additional Comments: Pt reports improvement in vision. Able to navigate room and hallway environment safely, identifying potential safety hazards on the R side. Pt reports R vision as "blurry" now.   Perception Perception Perception: Not tested   Praxis Praxis Praxis: Not tested    Cognition Arousal/Alertness: Awake/alert Behavior During Therapy: WFL for tasks assessed/performed Overall Cognitive Status: Within Functional Limits for tasks assessed                                 General Comments: Pt's granddaughter present at the start of the session (she works here). Pt with much improved affect, smiling and eager to participate. Short blessed assessment completed with score of 28/28. She required increased time, non distracting enviornment and was unable to multitask. Pt reports she sometimes has a hard time remebering things "normally."              General Comments VSS on RA, granddaughter present and supportive    Pertinent Vitals/ Pain       Pain Assessment Pain Assessment: No/denies pain   Progress Toward Goals  OT Goals(current goals can now be found in the care plan section)  Progress towards OT goals: Progressing toward goals  Acute Rehab OT Goals Patient Stated Goal: to rehab soon OT Goal Formulation: With patient Time For Goal Achievement: 08/31/21 Potential to Achieve Goals: Good ADL Goals Pt Will Perform Grooming: with modified independence;standing Pt Will Perform Lower Body Bathing: with modified independence;sit to/from stand Pt Will Perform Lower  Body Dressing: with modified independence;sit to/from stand Pt Will Transfer to Toilet: with modified independence;ambulating Additional ADL Goal #1: pt will independently identify necessary items in R visual field with compensatory techniques to complete ADLs  Plan Discharge plan remains appropriate       AM-PAC OT "6 Clicks" Daily Activity     Outcome Measure   Help from another person eating meals?: None Help from another person taking care of personal grooming?: A Little Help from another person toileting, which includes using toliet, bedpan, or urinal?: A Little Help from another person bathing (including washing, rinsing, drying)?: A Little Help from another person to put on and taking off regular upper body clothing?: None Help from another person to put on and taking off regular lower body clothing?: A Little 6 Click Score: 20    End of Session Equipment Utilized During Treatment: Gait belt;Rolling walker (2 wheels)  OT Visit Diagnosis: Other abnormalities of gait and mobility (R26.89);Muscle weakness (generalized) (M62.81);Low vision, both eyes (H54.2)   Activity Tolerance Patient tolerated treatment well   Patient Left in bed;with bed alarm set;with call bell/phone within reach   Nurse Communication Mobility status        Time: [id number] OT Time Calculation (min): 19 min  Charges: OT General Charges $OT Visit: 1 Visit OT Treatments $Self Care/Home Management : 8-22 mins   Shiasia Porro A Keni Elison 08/19/2021, 3:01 PM

## 2021-08-19 NOTE — Progress Notes (Addendum)
PROGRESS NOTE    [name] [name]  ONG:[id number] DOB: 1961-06-19 DOA: 08/16/2021 PCP: Charlott Rakes, MD   Chief Complain: Dizziness  Brief Narrative: Patient is a 61 year old female with history of chronic pain syndrome, uncontrolled diabetes with A1c more than 15, adrenal insufficiency, orthostatic hypotension secondary to autonomic dysfunction who presented with dizziness, blurred vision.  Symptoms started 4 days before admission.  As per the report, she was noncompliant with her medications.  On presentation orthostatic vital signs were positive.  Patient was complaining of bilateral blurry vision so ophthalmology consulted.  CT imagings showed age-indeterminate lacunar infarct, no evidence of large vessel occlusion, cervical carotid atherosclerosis.  Chest x-ray showed medial right lower lobe pneumonia.  MRI showed  left caudate tail acute infarct.  Stroke work-up completed.  Medically stable for discharge to CIR as soon as bed is available   Assessment & Plan:   Principal Problem:   Orthostatic hypotension Active Problems:   Uncontrolled type 2 diabetes mellitus with hyperglycemia, with long-term current use of insulin (HCC)   Current severe episode of major depressive disorder without psychotic features (HCC)   Visual field constriction of right eye   DNR (do not resuscitate)   Acute ischemic stroke: MRI of the brain showed left caudate tail acute infarct.  Presented with dizziness, blurry vision.  Stroke work-up initiated. PT/OT/speech evaluation requested.  CT angio head and neck showed no emergent vascular finding,cervical carotid atherosclerosis with up to 35% ICA narrowing on the left. LDL of 106, hemoglobin A1c of 13.3.Echo showed EF of 60 to 44%, grade 1 diastolic function, No intracardiac source of emboli. She has mild  right sided weakness, still has blurry vision on the right side / loss of lateral visual field on the right. Neurology recommended aspirin and Plavix for 3  weeks followed by aspirin alone.On lipitor 40 mg daily PT/OT recommending CIR on discharge  Right-sided pneumonia: Chest x-ray showed medial right lower lobe pneumonia.  She is on room air, does not complain of any cough.  Lungs were clear on auscultation .Initially started on ceftriaxone ,azithromycin.  She was previously admitted for pneumonia in December on the same side.  We will continue short course of antibiotics,changed to oral  Chronic orthostatic hypotension: History of autonomic dysfunction, adrenal insufficiency.  Orthostatic vitals were positive.  She is chronically on midodrine.  Fall precautions.    History of adrenal insufficiency: Previously taking hydrocortisone 10 mg daily .but currently she was not taking any most likely secondary to noncompliance.  Her previous notes from PCP clearly says that she should be on hydrocortisone 10 mg daily though it can increase her blood sugars.  She was given a dose of steroid in the emergency department.  Started  her on hydrocortisone 10 mg daily.  Blurry vision: Complained of blurry vision on the right side.  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  Diabetic coordinator  consulted.  She has history of noncompliance and says that she forgets to take her medications sometimes.  Continue insulin at current dose.  Monitor blood sugars.  A1c of 13.3  Chronic sinus tachycardia: Chronic problem.  Normal TSH.  No room for starting on beta-blocker due to hypotension  Chronic pain syndrome: CT cervical spine showed multilevel degenerative disc disease.  Continue supportive care           DVT prophylaxis:Lovenox Code Status: Full Family Communication:: Discussed with daughter on phone on 1/29 Patient status:Inpatient  Dispo: The patient is from: Home              Anticipated d/c is to: CIR              Anticipated d/c date is:As soon as bed is  available Consultants: Neurology  Procedures: None  Antimicrobials:  Anti-infectives (From admission, onward)    Start     Dose/Rate Route Frequency Ordered Stop   08/19/21 1000  cefdinir (OMNICEF) capsule 300 mg        300 mg Oral Every 12 hours 08/18/21 1100 08/22/21 0959   08/17/21 1000  azithromycin (ZITHROMAX) tablet 500 mg        500 mg Oral Daily 08/17/21 0806 08/20/21 0959   08/17/21 0900  cefTRIAXone (ROCEPHIN) 1 g in sodium chloride 0.9 % 100 mL IVPB  Status:  Discontinued        1 g 200 mL/hr over 30 Minutes Intravenous Every 24 hours 08/17/21 0806 08/18/21 1100       Subjective: Patient seen and examined at the bedside this morning.  Hemodynamically stable.  Comfortable today.  Still complains of blurry vision on the right side.  Sitting in the chair.  Denies any new complaints  Objective: Vitals:   08/18/21 2008 08/18/21 2338 08/19/21 0336 08/19/21 0717  BP: (!) 152/80 129/74 107/65 130/80  Pulse: 78 100 100 100  Resp: 20  20 20   Temp: 98.4 F (36.9 C) 98.5 F (36.9 C) 98.5 F (36.9 C) 97.7 F (36.5 C)  TempSrc: Oral Oral Oral Oral  SpO2: 98% 98% 96% 98%  Weight:      Height:        Intake/Output Summary (Last 24 hours) at 08/19/2021 0755 Last data filed at 08/19/2021 0400 Gross per 24 hour  Intake 3 ml  Output 1650 ml  Net -1647 ml   Filed Weights   08/16/21 0924  Weight: 63.5 kg    Examination:   General exam: Weak, deconditioned, flat affect, apathetic, but overall comfortable HEENT: Blurry vision on the right Respiratory system:  no wheezes or crackles  Cardiovascular system: S1 & S2 heard, RRR.  Gastrointestinal system: Abdomen is nondistended, soft and nontender. Central nervous system: Alert and oriented, mild weakness on the right side on both upper and lower extremities Extremities: No edema, no clubbing ,no cyanosis Skin: No rashes, no ulcers,no icterus    Data Reviewed: I have personally reviewed following labs and imaging  studies  CBC: Recent Labs  Lab 08/16/21 0218 08/17/21 0106 08/18/21 0227  WBC 6.7 11.1* 5.2  NEUTROABS 4.4  --  2.2  HGB 14.3 11.4* 11.3*  HCT 41.9 34.0* 33.6*  MCV 91.7 92.4 92.3  PLT 262 225 732   Basic Metabolic Panel: Recent Labs  Lab 08/16/21 0218 08/17/21 0106 08/18/21 0227  NA 139 133* 137  K 3.6 3.9 3.6  CL 103 103 105  CO2 26 24 25   GLUCOSE 105* 283* 293*  BUN 12 18 12   CREATININE 0.62 0.68 0.49  CALCIUM 9.4 8.6* 8.4*   GFR: Estimated Creatinine Clearance: 69.1 mL/min (by C-G formula based on SCr of 0.49 mg/dL). Liver Function Tests: Recent Labs  Lab 08/16/21 0218  AST 38  ALT 29  ALKPHOS 104  BILITOT 0.9  PROT 6.6  ALBUMIN 3.4*   No results for input(s): LIPASE, AMYLASE in the last 168 hours. No results for input(s): AMMONIA in the last 168 hours. Coagulation Profile: No results for input(s): INR, PROTIME in the last 168 hours. Cardiac  Enzymes: No results for input(s): CKTOTAL, CKMB, CKMBINDEX, TROPONINI in the last 168 hours. BNP (last 3 results) No results for input(s): PROBNP in the last 8760 hours. HbA1C: Recent Labs    08/17/21 0106  HGBA1C 13.3*   CBG: Recent Labs  Lab 08/18/21 0604 08/18/21 1141 08/18/21 1636 08/18/21 2204 08/19/21 0622  GLUCAP 211* 181* 246* 148* 134*   Lipid Profile: Recent Labs    08/18/21 0227  CHOL 162  HDL 34*  LDLCALC 106*  TRIG 109  CHOLHDL 4.8   Thyroid Function Tests: No results for input(s): TSH, T4TOTAL, FREET4, T3FREE, THYROIDAB in the last 72 hours.  Anemia Panel: No results for input(s): VITAMINB12, FOLATE, FERRITIN, TIBC, IRON, RETICCTPCT in the last 72 hours. Sepsis Labs: No results for input(s): PROCALCITON, LATICACIDVEN in the last 168 hours.  Recent Results (from the past 240 hour(s))  Resp Panel by RT-PCR (Flu A&B, Covid) Nasopharyngeal Swab     Status: None   Collection Time: 08/16/21  2:09 AM   Specimen: Nasopharyngeal Swab; Nasopharyngeal(NP) swabs in vial transport medium   Result Value Ref Range Status   SARS Coronavirus 2 by RT PCR NEGATIVE NEGATIVE Final    Comment: (NOTE) SARS-CoV-2 target nucleic acids are NOT DETECTED.  The SARS-CoV-2 RNA is generally detectable in upper respiratory specimens during the acute phase of infection. The lowest concentration of SARS-CoV-2 viral copies this assay can detect is 138 copies/mL. A negative result does not preclude SARS-Cov-2 infection and should not be used as the sole basis for treatment or other patient management decisions. A negative result may occur with  improper specimen collection/handling, submission of specimen other than nasopharyngeal swab, presence of viral mutation(s) within the areas targeted by this assay, and inadequate number of viral copies(<138 copies/mL). A negative result must be combined with clinical observations, patient history, and epidemiological information. The expected result is Negative.  Fact Sheet for Patients:  EntrepreneurPulse.com.au  Fact Sheet for Healthcare Providers:  IncredibleEmployment.be  This test is no t yet approved or cleared by the [place] FDA and  has been authorized for detection and/or diagnosis of SARS-CoV-2 by FDA under an Emergency Use Authorization (EUA). This EUA will remain  in effect (meaning this test can be used) for the duration of the COVID-19 declaration under Section 564(b)(1) of the Act, 21 U.S.C.section 360bbb-3(b)(1), unless the authorization is terminated  or revoked sooner.       Influenza A by PCR NEGATIVE NEGATIVE Final   Influenza B by PCR NEGATIVE NEGATIVE Final    Comment: (NOTE) The Xpert Xpress SARS-CoV-2/FLU/RSV plus assay is intended as an aid in the diagnosis of influenza from Nasopharyngeal swab specimens and should not be used as a sole basis for treatment. Nasal washings and aspirates are unacceptable for Xpert Xpress SARS-CoV-2/FLU/RSV testing.  Fact Sheet for  Patients: EntrepreneurPulse.com.au  Fact Sheet for Healthcare Providers: IncredibleEmployment.be  This test is not yet approved or cleared by the [place] FDA and has been authorized for detection and/or diagnosis of SARS-CoV-2 by FDA under an Emergency Use Authorization (EUA). This EUA will remain in effect (meaning this test can be used) for the duration of the COVID-19 declaration under Section 564(b)(1) of the Act, 21 U.S.C. section 360bbb-3(b)(1), unless the authorization is terminated or revoked.  Performed at [facility], [place] [address], [place], [place] [address]          Radiology Studies: ECHOCARDIOGRAM COMPLETE  Result Date: 08/17/2021    ECHOCARDIOGRAM REPORT   Patient Name:  [name] [name] Prom Date of Exam: 08/17/2021 Medical Rec #:  [id number]     Height:       66.0 in Accession #:    [id number]    Weight:       140.0 lb Date of Birth:  [birth year]     BSA:          1.719 m Patient Age:    23 years      BP:           104/57 mmHg Patient Gender: F             HR:           106 bpm. Exam Location:  Inpatient Procedure: 2D Echo, Cardiac Doppler and Color Doppler Indications:    Stroke I63.9  History:        Patient has prior history of Echocardiogram examinations, most                 recent 07/29/2020. Risk Factors:Diabetes and Hypertension.  Sonographer:    Bernadene Person RDCS Referring Phys: [id number] [place]  1. Left ventricular ejection fraction, by estimation, is 60 to 65%. The left ventricle has normal function. The left ventricle has no regional wall motion abnormalities. Left ventricular diastolic parameters are consistent with Grade I diastolic dysfunction (impaired relaxation).  2. Right ventricular systolic function is normal. The right ventricular size is normal. There is normal pulmonary artery systolic pressure. The estimated right ventricular systolic pressure is 81.4 mmHg.  3. The mitral valve is normal  in structure. No evidence of mitral valve regurgitation. No evidence of mitral stenosis.  4. The aortic valve is normal in structure. Aortic valve regurgitation is not visualized. No aortic stenosis is present.  5. The inferior vena cava is normal in size with greater than 50% respiratory variability, suggesting right atrial pressure of 3 mmHg. Comparison(s): No significant change from prior study. Prior images reviewed side by side. FINDINGS  Left Ventricle: Left ventricular ejection fraction, by estimation, is 60 to 65%. The left ventricle has normal function. The left ventricle has no regional wall motion abnormalities. The left ventricular internal cavity size was normal in size. There is  no left ventricular hypertrophy. Left ventricular diastolic parameters are consistent with Grade I diastolic dysfunction (impaired relaxation). Right Ventricle: The right ventricular size is normal. No increase in right ventricular wall thickness. Right ventricular systolic function is normal. There is normal pulmonary artery systolic pressure. The tricuspid regurgitant velocity is 2.33 m/s, and  with an assumed right atrial pressure of 3 mmHg, the estimated right ventricular systolic pressure is 48.1 mmHg. Left Atrium: Left atrial size was normal in size. Right Atrium: Right atrial size was normal in size. Pericardium: There is no evidence of pericardial effusion. Mitral Valve: The mitral valve is normal in structure. No evidence of mitral valve regurgitation. No evidence of mitral valve stenosis. Tricuspid Valve: The tricuspid valve is normal in structure. Tricuspid valve regurgitation is mild . No evidence of tricuspid stenosis. Aortic Valve: The aortic valve is normal in structure. Aortic valve regurgitation is not visualized. No aortic stenosis is present. Pulmonic Valve: The pulmonic valve was normal in structure. Pulmonic valve regurgitation is not visualized. No evidence of pulmonic stenosis. Aorta: The aortic root is  normal in size and structure. Venous: The inferior vena cava is normal in size with greater than 50% respiratory variability, suggesting right atrial pressure of 3 mmHg. IAS/Shunts: No atrial level shunt detected by color flow Doppler.  LEFT VENTRICLE PLAX 2D LVIDd:         3.40 cm     Diastology LVIDs:         2.20 cm     LV e' medial:    6.20 cm/s LV PW:         0.90 cm     LV E/e' medial:  13.8 LV IVS:        0.70 cm     LV e' lateral:   5.96 cm/s LVOT diam:     1.90 cm     LV E/e' lateral: 14.4 LV SV:         43 LV SV Index:   25 LVOT Area:     2.84 cm  LV Volumes (MOD) LV vol d, MOD A2C: 46.6 ml LV vol d, MOD A4C: 62.3 ml LV vol s, MOD A2C: 18.4 ml LV vol s, MOD A4C: 27.7 ml LV SV MOD A2C:     28.2 ml LV SV MOD A4C:     62.3 ml LV SV MOD BP:      30.7 ml RIGHT VENTRICLE RV S prime:     12.70 cm/s TAPSE (M-mode): 2.0 cm LEFT ATRIUM             Index        RIGHT ATRIUM          Index LA diam:        2.60 cm 1.51 cm/m   RA Area:     8.96 cm LA Vol (A2C):   28.5 ml 16.58 ml/m  RA Volume:   16.80 ml 9.78 ml/m LA Vol (A4C):   30.7 ml 17.86 ml/m LA Biplane Vol: 32.3 ml 18.80 ml/m  AORTIC VALVE LVOT Vmax:   96.80 cm/s LVOT Vmean:  64.000 cm/s LVOT VTI:    0.152 m  AORTA Ao Root diam: 2.80 cm Ao Asc diam:  3.10 cm MITRAL VALVE                TRICUSPID VALVE MV Area (PHT): 5.66 cm     TR Peak grad:   21.7 mmHg MV Decel Time: 134 msec     TR Vmax:        233.00 cm/s MV E velocity: 85.70 cm/s MV A velocity: 103.00 cm/s  SHUNTS MV E/A ratio:  0.83         Systemic VTI:  0.15 m                             Systemic Diam: 1.90 cm Mihai Croitoru MD Electronically signed by Sanda Klein MD Signature Date/Time: 08/17/2021/11:30:18 AM    Final         Scheduled Meds:  aspirin  325 mg Oral Daily   atorvastatin  40 mg Oral Daily   azithromycin  500 mg Oral Daily   cefdinir  300 mg Oral Q12H   docusate sodium  100 mg Oral BID   DULoxetine  60 mg Oral BID   enoxaparin (LOVENOX) injection  40 mg Subcutaneous Q24H    gabapentin  1,200 mg Oral QHS   gabapentin  600 mg Oral BID   hydrocortisone  10 mg Oral Daily   insulin aspart  0-15 Units Subcutaneous TID WC   insulin aspart  0-5 Units Subcutaneous QHS   insulin aspart  6 Units Subcutaneous TID WC   insulin glargine-yfgn  55 Units Subcutaneous QHS   midodrine  10  mg Oral TID WC   sodium chloride flush  3 mL Intravenous Q12H   Continuous Infusions:     LOS: 2 days        Shelly Coss, MD [facility] P1/31/2023, 7:55 AM

## 2021-08-19 NOTE — Progress Notes (Signed)
Inpatient Rehabilitation Admissions Coordinator   I await [facility] insurance approval for possible CIr admit when medically ready.  Danne Baxter, RN, MSN Rehab Admissions Coordinator [phone] 08/19/2021 12:24 PM

## 2021-08-19 NOTE — Progress Notes (Signed)
Physical Therapy Treatment Patient Details Name: [name] [name] MRN: [id number] DOB: 07/07/1961 Today's Date: 08/19/2021   History of Present Illness [name] [name] is a 61 y.o. female who presents with right sided visual field deficits and dizziness. CT (+) lacunar infarct at the left caudate body. Pt with a history of chronic pain, uncontrolled DM (with a recent episode of DKA and A1C >15), HTN, adrenal insufficiency and orthostatic hypotension    PT Comments    Pt continues with flat affect but with good participation and appreciative of max encouragement to continue through fatigue. Pt with noted significant weakness in bilat LEs R worse than L in addition to core. Focused on LE strengthening via min squats in front of chair x 5 reps, x2 sets, noted bilat LE shaking. Gave pt seated LE HEP. In addition provided isometric abdominal exercises in sitting as well. Pt reports fatigue. Discussed AIR and the expectations. Continue to recommend AIR to achieve safe mod I level of function prior to transitioning home with roommate. Acute PT to cont to follow.   Recommendations for follow up therapy are one component of a multi-disciplinary discharge planning process, led by the attending physician.  Recommendations may be updated based on patient status, additional functional criteria and insurance authorization.  Follow Up Recommendations  Acute inpatient rehab (3hours/day)     Assistance Recommended at Discharge Frequent or constant Supervision/Assistance  Patient can return home with the following A little help with walking and/or transfers;A little help with bathing/dressing/bathroom;Assistance with cooking/housework;Direct supervision/assist for medications management;Direct supervision/assist for financial management;Assist for transportation;Help with stairs or ramp for entrance   Equipment Recommendations  Other (comment) (TBA)    Recommendations for Other Services Rehab consult      Precautions / Restrictions Precautions Precautions: Fall Precaution Comments: Right homonymous hemianopia Restrictions Weight Bearing Restrictions: No     Mobility  Bed Mobility Overal bed mobility: Needs Assistance Bed Mobility: Supine to Sit     Supine to sit: Supervision Sit to supine: Supervision   General bed mobility comments: cues only, incresaed time    Transfers Overall transfer level: Needs assistance Equipment used: 1 person hand held assist Transfers: Sit to/from Stand Sit to Stand: Min assist           General transfer comment: minA with HHA, verbal cues and min guard when standing up from chair to RW, verbal cues for hand placement, increased time, initally took a couple of attempts    Ambulation/Gait Ambulation/Gait assistance: Min assist Gait Distance (Feet): 120 Feet Assistive device: Rolling walker (2 wheels) Gait Pattern/deviations: Step-through pattern, Decreased stride length, Shuffle Gait velocity: slower Gait velocity interpretation: <1.8 ft/sec, indicate of risk for recurrent falls   General Gait Details: attemptd to amb with R HHA however pt unsteady and reaching with L UE to hold onto something, transitioned to RW, pt with increased stability however continues to brace self on UEs with short step height and length in steps. minA to maintain forward progression of walker to improved fluidity of gait, very slow, minA also to not vear R with RW   Stairs             Wheelchair Mobility    Modified Rankin (Stroke Patients Only) Modified Rankin (Stroke Patients Only) Pre-Morbid Rankin Score: No symptoms Modified Rankin: Moderately severe disability     Balance Overall balance assessment: Needs assistance Sitting-balance support: Feet supported Sitting balance-Leahy Scale: Fair     Standing balance support: Single extremity supported Standing balance-Leahy Scale: Fair  Standing balance comment: requires bilat UE support for  stability                            Cognition Arousal/Alertness: Awake/alert Behavior During Therapy: Flat affect Overall Cognitive Status: Impaired/Different from baseline Area of Impairment: Following commands, Awareness                       Following Commands: Follows one step commands with increased time   Awareness: Emergent   General Comments: pt very flat affect, depressed spirits, requires max encourgament to continue to participate, followed simple one step commands        Exercises General Exercises - Lower Extremity Long Arc Quad: AROM, Both, 10 reps, Seated (with 3 sec hold at end range) Hip Flexion/Marching: AROM, Both, 10 reps, Seated (emphasis on slow and controled) Heel Raises: AROM, Both, 10 reps, Seated (emphasis on slow and controled, 5 sec hold at top)    General Comments General comments (skin integrity, edema, etc.): VSS      Pertinent Vitals/Pain Pain Assessment Pain Assessment: No/denies pain    Home Living                          Prior Function            PT Goals (current goals can now be found in the care plan section) Acute Rehab PT Goals PT Goal Formulation: With patient Time For Goal Achievement: 09/01/21 Potential to Achieve Goals: Good Progress towards PT goals: Progressing toward goals    Frequency    Min 4X/week      PT Plan Current plan remains appropriate    Co-evaluation              AM-PAC PT "6 Clicks" Mobility   Outcome Measure  Help needed turning from your back to your side while in a flat bed without using bedrails?: A Little Help needed moving from lying on your back to sitting on the side of a flat bed without using bedrails?: A Little Help needed moving to and from a bed to a chair (including a wheelchair)?: A Little Help needed standing up from a chair using your arms (e.g., wheelchair or bedside chair)?: A Lot Help needed to walk in hospital room?: A Lot Help needed  climbing 3-5 steps with a railing? : A Lot 6 Click Score: 15    End of Session Equipment Utilized During Treatment: Gait belt Activity Tolerance: Patient tolerated treatment well Patient left: with call bell/phone within reach;in chair;with chair alarm set Nurse Communication: Mobility status PT Visit Diagnosis: Other abnormalities of gait and mobility (R26.89);Difficulty in walking, not elsewhere classified (R26.2);Other symptoms and signs involving the nervous system (R29.898)     Time: 0174-9449 PT Time Calculation (min) (ACUTE ONLY): 32 min  Charges:  $Gait Training: 8-22 mins $Therapeutic Exercise: 8-22 mins                     [name] [name], PT, DPT Acute Rehabilitation Services Pager #: [phone] Office #: [phone]    [name] [name] 08/19/2021, 8:55 AM

## 2021-08-20 ENCOUNTER — Encounter (HOSPITAL_COMMUNITY): Payer: Self-pay | Admitting: Physical Medicine & Rehabilitation

## 2021-08-20 ENCOUNTER — Inpatient Hospital Stay (HOSPITAL_COMMUNITY)
Admission: RE | Admit: 2021-08-20 | Discharge: 2021-08-27 | DRG: 057 | Disposition: A | Discharge status: Home / Self Care | Payer: Medicare Other | Source: Intra-hospital | Attending: Physical Medicine & Rehabilitation | Admitting: Physical Medicine & Rehabilitation

## 2021-08-20 ENCOUNTER — Other Ambulatory Visit: Payer: Self-pay

## 2021-08-20 DIAGNOSIS — I69351 Hemiplegia and hemiparesis following cerebral infarction affecting right dominant side: Principal | ICD-10-CM

## 2021-08-20 DIAGNOSIS — Z87891 Personal history of nicotine dependence: Secondary | ICD-10-CM | POA: Diagnosis not present

## 2021-08-20 DIAGNOSIS — I6300 Cerebral infarction due to thrombosis of unspecified precerebral artery: Secondary | ICD-10-CM

## 2021-08-20 DIAGNOSIS — Z833 Family history of diabetes mellitus: Secondary | ICD-10-CM

## 2021-08-20 DIAGNOSIS — E1165 Type 2 diabetes mellitus with hyperglycemia: Secondary | ICD-10-CM | POA: Diagnosis present

## 2021-08-20 DIAGNOSIS — I951 Orthostatic hypotension: Secondary | ICD-10-CM | POA: Diagnosis present

## 2021-08-20 DIAGNOSIS — I63442 Cerebral infarction due to embolism of left cerebellar artery: Secondary | ICD-10-CM

## 2021-08-20 DIAGNOSIS — K056 Periodontal disease, unspecified: Secondary | ICD-10-CM | POA: Diagnosis present

## 2021-08-20 DIAGNOSIS — I10 Essential (primary) hypertension: Secondary | ICD-10-CM | POA: Diagnosis present

## 2021-08-20 DIAGNOSIS — R4189 Other symptoms and signs involving cognitive functions and awareness: Secondary | ICD-10-CM | POA: Diagnosis present

## 2021-08-20 DIAGNOSIS — H538 Other visual disturbances: Secondary | ICD-10-CM | POA: Diagnosis present

## 2021-08-20 DIAGNOSIS — Z79899 Other long term (current) drug therapy: Secondary | ICD-10-CM

## 2021-08-20 DIAGNOSIS — E1143 Type 2 diabetes mellitus with diabetic autonomic (poly)neuropathy: Secondary | ICD-10-CM | POA: Diagnosis present

## 2021-08-20 DIAGNOSIS — Z841 Family history of disorders of kidney and ureter: Secondary | ICD-10-CM

## 2021-08-20 DIAGNOSIS — I639 Cerebral infarction, unspecified: Secondary | ICD-10-CM

## 2021-08-20 DIAGNOSIS — R2689 Other abnormalities of gait and mobility: Secondary | ICD-10-CM | POA: Diagnosis present

## 2021-08-20 DIAGNOSIS — I69398 Other sequelae of cerebral infarction: Secondary | ICD-10-CM | POA: Diagnosis not present

## 2021-08-20 DIAGNOSIS — Z66 Do not resuscitate: Secondary | ICD-10-CM | POA: Diagnosis present

## 2021-08-20 DIAGNOSIS — Z794 Long term (current) use of insulin: Secondary | ICD-10-CM

## 2021-08-20 DIAGNOSIS — Z8249 Family history of ischemic heart disease and other diseases of the circulatory system: Secondary | ICD-10-CM

## 2021-08-20 DIAGNOSIS — H53481 Generalized contraction of visual field, right eye: Secondary | ICD-10-CM | POA: Diagnosis present

## 2021-08-20 DIAGNOSIS — E2740 Unspecified adrenocortical insufficiency: Secondary | ICD-10-CM | POA: Diagnosis present

## 2021-08-20 DIAGNOSIS — Z801 Family history of malignant neoplasm of trachea, bronchus and lung: Secondary | ICD-10-CM

## 2021-08-20 DIAGNOSIS — E1142 Type 2 diabetes mellitus with diabetic polyneuropathy: Secondary | ICD-10-CM | POA: Diagnosis present

## 2021-08-20 LAB — GLUCOSE, CAPILLARY
Glucose-Capillary: 131 mg/dL — ABNORMAL HIGH (ref 70–99)
Glucose-Capillary: 114 mg/dL — ABNORMAL HIGH (ref 70–99)
Glucose-Capillary: 132 mg/dL — ABNORMAL HIGH (ref 70–99)
Glucose-Capillary: 210 mg/dL — ABNORMAL HIGH (ref 70–99)

## 2021-08-20 MED ORDER — CEFDINIR 300 MG PO CAPS
300.0000 mg | ORAL_CAPSULE | Freq: Two times a day (BID) | ORAL | Status: AC
  Administered 2021-08-20 – 2021-08-21 (×3): 300 mg via ORAL
  Filled 2021-08-20 (×3): qty 1

## 2021-08-20 MED ORDER — GUAIFENESIN-DM 100-10 MG/5ML PO SYRP: 5.0000 mL | ORAL_SOLUTION | Freq: Four times a day (QID) | ORAL | Status: DC | PRN

## 2021-08-20 MED ORDER — CLOPIDOGREL BISULFATE 75 MG PO TABS: 75.0000 mg | ORAL_TABLET | Freq: Every day | ORAL | 0 refills | Status: DC

## 2021-08-20 MED ORDER — ENOXAPARIN SODIUM 40 MG/0.4ML IJ SOSY
40.0000 mg | PREFILLED_SYRINGE | INTRAMUSCULAR | Status: DC
  Administered 2021-08-20 – 2021-08-26 (×7): 40 mg via SUBCUTANEOUS
  Filled 2021-08-20 (×7): qty 0.4

## 2021-08-20 MED ORDER — CEFDINIR 300 MG PO CAPS: 300.0000 mg | ORAL_CAPSULE | Freq: Two times a day (BID) | ORAL | 0 refills | Status: DC

## 2021-08-20 MED ORDER — INSULIN ASPART 100 UNIT/ML IJ SOLN
6.0000 [IU] | Freq: Three times a day (TID) | INTRAMUSCULAR | Status: DC
  Administered 2021-08-20 – 2021-08-22 (×4): 6 [IU] via SUBCUTANEOUS

## 2021-08-20 MED ORDER — ATORVASTATIN CALCIUM 40 MG PO TABS: 40.0000 mg | ORAL_TABLET | Freq: Every day | ORAL | Status: DC

## 2021-08-20 MED ORDER — INSULIN ASPART 100 UNIT/ML IJ SOLN: 6.0000 [IU] | Freq: Three times a day (TID) | INTRAMUSCULAR | 11 refills | Status: AC

## 2021-08-20 MED ORDER — BISACODYL 10 MG RE SUPP: 10.0000 mg | Freq: Every day | RECTAL | Status: DC | PRN

## 2021-08-20 MED ORDER — POLYETHYLENE GLYCOL 3350 17 G PO PACK: 17.0000 g | PACK | Freq: Every day | ORAL | 0 refills | Status: AC | PRN

## 2021-08-20 MED ORDER — BLISTEX MEDICATED EX OINT
TOPICAL_OINTMENT | CUTANEOUS | Status: DC | PRN
  Filled 2021-08-20: qty 6.3

## 2021-08-20 MED ORDER — DOCUSATE SODIUM 100 MG PO CAPS: 100.0000 mg | ORAL_CAPSULE | Freq: Two times a day (BID) | ORAL | 0 refills | Status: DC

## 2021-08-20 MED ORDER — INSULIN ASPART 100 UNIT/ML IJ SOLN: 0.0000 [IU] | Freq: Every day | INTRAMUSCULAR | 11 refills | Status: DC

## 2021-08-20 MED ORDER — CLOPIDOGREL BISULFATE 75 MG PO TABS
75.0000 mg | ORAL_TABLET | Freq: Every day | ORAL | Status: DC
  Administered 2021-08-21 – 2021-08-27 (×7): 75 mg via ORAL
  Filled 2021-08-20 (×7): qty 1

## 2021-08-20 MED ORDER — INSULIN ASPART 100 UNIT/ML IJ SOLN
0.0000 [IU] | Freq: Three times a day (TID) | INTRAMUSCULAR | Status: DC
  Administered 2021-08-20: 1 [IU] via SUBCUTANEOUS
  Administered 2021-08-21: 2 [IU] via SUBCUTANEOUS
  Administered 2021-08-21: 3 [IU] via SUBCUTANEOUS
  Administered 2021-08-21: 1 [IU] via SUBCUTANEOUS
  Administered 2021-08-22: 2 [IU] via SUBCUTANEOUS
  Administered 2021-08-22: 1 [IU] via SUBCUTANEOUS
  Administered 2021-08-23: 5 [IU] via SUBCUTANEOUS
  Administered 2021-08-24: 2 [IU] via SUBCUTANEOUS
  Administered 2021-08-24 – 2021-08-25 (×2): 1 [IU] via SUBCUTANEOUS
  Administered 2021-08-25: 3 [IU] via SUBCUTANEOUS
  Administered 2021-08-26: 2 [IU] via SUBCUTANEOUS

## 2021-08-20 MED ORDER — DIPHENHYDRAMINE HCL 12.5 MG/5ML PO ELIX: 12.5000 mg | ORAL_SOLUTION | Freq: Four times a day (QID) | ORAL | Status: DC | PRN

## 2021-08-20 MED ORDER — ASPIRIN 81 MG PO TBEC: 81.0000 mg | DELAYED_RELEASE_TABLET | Freq: Every day | ORAL | 11 refills | Status: DC

## 2021-08-20 MED ORDER — ASPIRIN EC 81 MG PO TBEC
81.0000 mg | DELAYED_RELEASE_TABLET | Freq: Every day | ORAL | Status: DC
  Administered 2021-08-21 – 2021-08-27 (×7): 81 mg via ORAL
  Filled 2021-08-20 (×7): qty 1

## 2021-08-20 MED ORDER — PROCHLORPERAZINE MALEATE 5 MG PO TABS: 5.0000 mg | ORAL_TABLET | Freq: Four times a day (QID) | ORAL | Status: DC | PRN

## 2021-08-20 MED ORDER — INSULIN GLARGINE-YFGN 100 UNIT/ML ~~LOC~~ SOLN: 55.0000 [IU] | Freq: Every day | SUBCUTANEOUS | 11 refills | Status: DC

## 2021-08-20 MED ORDER — INSULIN ASPART 100 UNIT/ML IJ SOLN: 0.0000 [IU] | Freq: Three times a day (TID) | INTRAMUSCULAR | 11 refills | Status: DC

## 2021-08-20 MED ORDER — ACETAMINOPHEN 325 MG PO TABS
325.0000 mg | ORAL_TABLET | ORAL | Status: DC | PRN
  Administered 2021-08-21 – 2021-08-22 (×2): 650 mg via ORAL
  Filled 2021-08-20 (×4): qty 2

## 2021-08-20 MED ORDER — ACETAMINOPHEN-CODEINE #3 300-30 MG PO TABS: 1.0000 | ORAL_TABLET | ORAL | Status: DC | PRN

## 2021-08-20 MED ORDER — INSULIN GLARGINE-YFGN 100 UNIT/ML ~~LOC~~ SOLN
55.0000 [IU] | Freq: Every day | SUBCUTANEOUS | Status: DC
  Administered 2021-08-20 – 2021-08-26 (×7): 55 [IU] via SUBCUTANEOUS
  Filled 2021-08-20 (×8): qty 0.55

## 2021-08-20 MED ORDER — ATORVASTATIN CALCIUM 40 MG PO TABS
40.0000 mg | ORAL_TABLET | Freq: Every day | ORAL | Status: DC
  Administered 2021-08-21 – 2021-08-27 (×7): 40 mg via ORAL
  Filled 2021-08-20 (×7): qty 1

## 2021-08-20 MED ORDER — ALUM & MAG HYDROXIDE-SIMETH 200-200-20 MG/5ML PO SUSP: 30.0000 mL | ORAL | Status: DC | PRN

## 2021-08-20 MED ORDER — GABAPENTIN 300 MG PO CAPS
600.0000 mg | ORAL_CAPSULE | Freq: Two times a day (BID) | ORAL | Status: DC
  Administered 2021-08-21 – 2021-08-27 (×13): 600 mg via ORAL
  Filled 2021-08-20 (×13): qty 2

## 2021-08-20 MED ORDER — HYDROCORTISONE 10 MG PO TABS
10.0000 mg | ORAL_TABLET | Freq: Every day | ORAL | Status: DC
  Administered 2021-08-21 – 2021-08-22 (×2): 10 mg via ORAL
  Filled 2021-08-20 (×2): qty 1

## 2021-08-20 MED ORDER — DULOXETINE HCL 60 MG PO CPEP
60.0000 mg | ORAL_CAPSULE | Freq: Two times a day (BID) | ORAL | Status: DC
  Administered 2021-08-20 – 2021-08-27 (×14): 60 mg via ORAL
  Filled 2021-08-20 (×14): qty 1

## 2021-08-20 MED ORDER — MIDODRINE HCL 5 MG PO TABS
10.0000 mg | ORAL_TABLET | Freq: Three times a day (TID) | ORAL | Status: DC
  Administered 2021-08-20 – 2021-08-27 (×21): 10 mg via ORAL
  Filled 2021-08-20 (×21): qty 2

## 2021-08-20 MED ORDER — INSULIN ASPART 100 UNIT/ML IJ SOLN
0.0000 [IU] | Freq: Every day | INTRAMUSCULAR | Status: DC
  Administered 2021-08-20 – 2021-08-21 (×2): 2 [IU] via SUBCUTANEOUS
  Administered 2021-08-23: 4 [IU] via SUBCUTANEOUS
  Administered 2021-08-25: 3 [IU] via SUBCUTANEOUS

## 2021-08-20 MED ORDER — PROCHLORPERAZINE EDISYLATE 10 MG/2ML IJ SOLN: 5.0000 mg | Freq: Four times a day (QID) | INTRAMUSCULAR | Status: DC | PRN

## 2021-08-20 MED ORDER — TRAZODONE HCL 50 MG PO TABS: 25.0000 mg | ORAL_TABLET | Freq: Every evening | ORAL | Status: DC | PRN

## 2021-08-20 MED ORDER — PROCHLORPERAZINE 25 MG RE SUPP: 12.5000 mg | Freq: Four times a day (QID) | RECTAL | Status: DC | PRN

## 2021-08-20 MED ORDER — FLEET ENEMA 7-19 GM/118ML RE ENEM: 1.0000 | ENEMA | Freq: Once | RECTAL | Status: DC | PRN

## 2021-08-20 MED ORDER — HYDROCORTISONE 10 MG PO TABS: 10.0000 mg | ORAL_TABLET | Freq: Every day | ORAL | Status: DC

## 2021-08-20 MED ORDER — POLYETHYLENE GLYCOL 3350 17 G PO PACK: 17.0000 g | PACK | Freq: Every day | ORAL | Status: DC | PRN

## 2021-08-20 MED ORDER — GABAPENTIN 400 MG PO CAPS
1200.0000 mg | ORAL_CAPSULE | Freq: Every day | ORAL | Status: DC
  Administered 2021-08-20 – 2021-08-26 (×7): 1200 mg via ORAL
  Filled 2021-08-20 (×7): qty 3

## 2021-08-20 MED ORDER — LACTATED RINGERS IV BOLUS
1000.0000 mL | Freq: Once | INTRAVENOUS | Status: AC
Start: 2021-08-20 — End: 2021-08-20
  Administered 2021-08-20: 1000 mL via INTRAVENOUS

## 2021-08-20 NOTE — Progress Notes (Signed)
Inpatient Rehabilitation Admissions Coordinator   I have insurance approval and CIR bed to admit her today. I met with patient at bedside and spoke with her daughter, Caesar Chestnut, by phone. They are in agreement to admit. I have alerted acute team and TOC to make the arrangements to admit.  Danne Baxter, RN, MSN Rehab Admissions Coordinator [phone] 08/20/2021 10:51 AM

## 2021-08-20 NOTE — Progress Notes (Signed)
Inpatient Rehabilitation Admission Medication Review by a Pharmacist  A complete drug regimen review was completed for this patient to identify any potential clinically significant medication issues.  High Risk Drug Classes Is patient taking? Indication by Medication  Antipsychotic Yes Compazine- N/V  Anticoagulant Yes Lovenox- VTE prophylaxis  Antibiotic Yes Cefdinir- right-sided pneumonia. End date is 08/21/2021  Opioid Yes Tylenol #3- chronic pain syndrome  Antiplatelet Yes Aspirin/Plavix- CVA prophylaxis. Plavix DC date 09/08/2021  Hypoglycemics/insulin Yes iSS, Semglee- T2DM  Vasoactive Medication Yes Midodrine- orthostatic hypotension  Chemotherapy No   Other Yes Trazodone- Sleep Cymbalta- MDD/Anxiety Lipitor- HLD Gabapentin- neuropathic pain Cortef- adrenal insufficiency     Type of Medication Issue Identified Description of Issue Recommendation(s)  Drug Interaction(s) (clinically significant)     Duplicate Therapy     Allergy     No Medication Administration End Date     Incorrect Dose     Additional Drug Therapy Needed     Significant med changes from prior encounter (inform family/care partners about these prior to discharge).    Other       Clinically significant medication issues were identified that warrant physician communication and completion of prescribed/recommended actions by midnight of the next day:  No  Time spent performing this drug regimen review (minutes):  30   Marcel Gary BS, PharmD, BCPS Clinical Pharmacist 08/20/2021 3:14 PM

## 2021-08-20 NOTE — TOC Transition Note (Signed)
Transition of Care [facility]) - CM/SW Discharge Note   Patient Details  Name: [name] [name] MRN: [id number] Date of Birth: [birth year]  [facility] CM/SW Contact:  Pollie Friar, RN Phone Number: 08/20/2021, 11:04 AM   Clinical Narrative:    Patient is discharging to CIR today. CM signing off.    Final next level of care: IP Rehab Facility Barriers to Discharge: No Barriers Identified   Patient Goals and CMS Choice        Discharge Placement                       Discharge Plan and Services                                     Social Determinants of Health (SDOH) Interventions     Readmission Risk Interventions No flowsheet data found.

## 2021-08-20 NOTE — H&P (Signed)
Physical Medicine and Rehabilitation Admission H&P    Chief Complaint  Patient presents with   Functional deficits due to stroke.     HPI: [name] [name] is a 61 year old female with histoyr of T2DM w/neuropathy, chronic pain, adrenal insufficiency, autonomic dysfunction with orthostasis who was admitted on 08/16/21 with four day history of blurred vision and dizziness. She ws found to have RLL PNA and CTA head/neck was negative for LVO. UDS positive for THC. She was started on ceftriaxone/Zithromax for treatment and was treated with stress dose steroids due to non-compliance. 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 2D echo showed EF 60-65% with no wall abnormality. Dr. Leonie Man felt that stroke was due to small vessel disease and recommended DAPT X 3 weeks followed by ASA alone. R-HH suspected, blurry vision is improving but patient continues to be limited by decreased sensation on right side with decrease in right motor control with slow shuffling gait as well as cognitive deficits. She complains of neuropathy.    Review of Systems  Constitutional: Negative.   HENT: Negative.    Eyes: Negative.   Respiratory: Negative.    Cardiovascular: Negative.   Gastrointestinal: Negative.   Genitourinary: Negative.   Musculoskeletal: Negative.   Skin: Negative.   Neurological:  Positive for sensory change.  Endo/Heme/Allergies: Negative.   Psychiatric/Behavioral:  Positive for depression.     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   Torn meniscus    Vertigo     Past Surgical History:  Procedure Laterality Date   ABDOMINAL HYSTERECTOMY     CESAREAN SECTION     ORIF ANKLE FRACTURE Right 02/25/2020   Procedure: OPEN REDUCTION INTERNAL FIXATION (ORIF) ANKLE FRACTURE;  Surgeon: Lovell Sheehan, MD;  Location: [facility] ORS;  Service: Orthopedics;  Laterality: Right;    Family History  Problem Relation Age of Onset   Depression Mother        type 1   Diabetes Mother    Renal Disease Mother    Lung cancer Father    Heart attack Brother        Died age 75 - was told due to massive MI/heart "exploded"   Heart failure Maternal Grandmother        Details not totally clear    Social History: Has a roommate who is in poor health?--daughter assists with medication setup.  reports that she quit smoking about 43 years ago. Her smoking use included cigarettes. She has never used smokeless tobacco. She reports that she does not currently use drugs after having used the following drugs: Marijuana. She reports that she does not drink alcohol.    Allergies  Allergen Reactions   Tramadol Nausea And Vomiting  REACTION: Projectile vomiting    Medications Prior to Admission  Medication Sig Dispense Refill   Accu-Chek Softclix Lancets lancets Use as instructed tid before meals 100 each 12   acetaminophen (TYLENOL) 500 MG tablet Take 2 tablets (1,000 mg total) by mouth every 6 (six) hours as needed for mild pain or headache (pain).  0   [START ON 08/21/2021] aspirin EC 81 MG EC tablet Take 1 tablet (81 mg total) by mouth daily. Swallow whole. 30 tablet 11   [START ON 08/21/2021] atorvastatin (LIPITOR) 40 MG tablet Take 1 tablet (40 mg total) by mouth daily.     Blood Glucose Monitoring Suppl (ACCU-CHEK GUIDE) w/Device KIT Use as directed tid 1 kit 0    cefdinir (OMNICEF) 300 MG capsule Take 1 capsule (300 mg total) by mouth every 12 (twelve) hours for 3 doses. 3 capsule 0   [START ON 08/21/2021] clopidogrel (PLAVIX) 75 MG tablet Take 1 tablet (75 mg total) by mouth daily for 19 days. 19 tablet 0   docusate sodium (COLACE) 100 MG capsule Take 1 capsule (100 mg total) by mouth 2 (two) times daily. 10 capsule 0   DULoxetine (CYMBALTA) 60 MG capsule Take 1 capsule (60 mg total) by mouth 2 (two) times daily. 60 capsule 3   gabapentin (NEURONTIN) 600 MG tablet TAKE ONE TABLET (600 MG) BY MOUTH EVERY MORNING AND AFTERNOON, TAKE TWO TABLETS AT NIGHT (Patient taking differently: Take 600 mg by mouth See admin instructions. TAKE ONE TABLET (600 MG) BY MOUTH EVERY MORNING AND AFTERNOON, TAKE TWO TABLETS AT NIGHT) 120 tablet 3   glucose blood (ACCU-CHEK GUIDE) test strip Use as instructed tid 100 each 12   [START ON 08/21/2021] hydrocortisone (CORTEF) 10 MG tablet Take 1 tablet (10 mg total) by mouth daily.     insulin aspart (NOVOLOG) 100 UNIT/ML injection Inject 0-15 Units into the skin 3 (three) times daily with meals. 10 mL 11   insulin aspart (NOVOLOG) 100 UNIT/ML injection Inject 0-5 Units into the skin at bedtime. 10 mL 11   insulin aspart (NOVOLOG) 100 UNIT/ML injection Inject 6 Units into the skin 3 (three) times daily with meals. 10 mL 11   insulin glargine-yfgn (SEMGLEE) 100 UNIT/ML injection Inject 0.55 mLs (55 Units total) into the skin at bedtime. 10 mL 11   Insulin Pen Needle (PEN NEEDLES) 31G X 8 MM MISC Use as directed 100 each 6   midodrine (PROAMATINE) 10 MG tablet Take 1 tablet (10 mg total) by mouth 3 (three) times daily with meals. 90 tablet 3   polyethylene glycol (MIRALAX / GLYCOLAX) 17 g packet Take 17 g by mouth daily as needed for mild constipation. 14 each 0   tiZANidine (ZANAFLEX) 4 MG tablet Take 4 mg by mouth every 6 (six) hours as needed for muscle spasms.      Drug Regimen Review  Drug regimen was reviewed and remains appropriate  with no significant issues identified  Home: Home Living Family/patient expects to be discharged to:: Private residence Living Arrangements: Other (Comment) (friend) Available Help at Discharge: Friend(s), Available PRN/intermittently Type of Home: House Home Access: Stairs to enter Technical brewer of Steps: 1 Home Layout: One level Bathroom Shower/Tub: Chiropodist: Standard Home Equipment: Conservation officer, nature (2 wheels), [employer] - single point, Civil engineer, contracting, Grab bars - tub/shower Additional Comments: pt states her friend cannot assist her "She is in as bad of shape as I am."  Lives With: Friend(s)   Functional History: Prior Function Prior Level of  Function : Independent/Modified Independent Mobility Comments: usd SPC ADLs Comments: indep   Functional Status:  Mobility: Bed Mobility Overal bed mobility: Needs Assistance Bed Mobility: Supine to Sit Supine to sit: Supervision Sit to supine: Supervision General bed mobility comments: cues only, incresaed time Transfers Overall transfer level: Needs assistance Equipment used: 1 person hand held assist Transfers: Sit to/from Stand Sit to Stand: Min assist General transfer comment: minA with HHA, verbal cues and min guard when standing up from chair to RW, verbal cues for hand placement, increased time, initally took a couple of attempts Ambulation/Gait Ambulation/Gait assistance: Min assist Gait Distance (Feet): 120 Feet Assistive device: Rolling walker (2 wheels) Gait Pattern/deviations: Step-through pattern, Decreased stride length, Shuffle General Gait Details: attemptd to amb with R HHA however pt unsteady and reaching with L UE to hold onto something, transitioned to RW, pt with increased stability however continues to brace self on UEs with short step height and length in steps. minA to maintain forward progression of walker to improved fluidity of gait, very slow, minA also to not vear R with RW Gait  velocity: slower Gait velocity interpretation: <1.8 ft/sec, indicate of risk for recurrent falls   ADL: ADL Overall ADL's : Needs assistance/impaired Eating/Feeding: Independent, Sitting Grooming: Set up, Sitting Upper Body Bathing: Set up, Sitting Lower Body Bathing: Minimal assistance, Sit to/from stand Upper Body Dressing : Set up, Sitting Lower Body Dressing: Minimal assistance, Sit to/from stand Lower Body Dressing Details (indicate cue type and reason): able to don socks while sitting EOB Toilet Transfer: Min guard, Stand-pivot, Rolling walker (2 wheels) Toilet Transfer Details (indicate cue type and reason): SP only for safety this session Toileting- Clothing Manipulation and Hygiene: Min guard, Sitting/lateral lean Functional mobility during ADLs: Minimal assistance General ADL Comments: pt limited by overall affect this session, required maximal encouragement for participation and incrased time for all tasks. Pt requires cues for R visual environment   Cognition: Cognition Overall Cognitive Status: Impaired/Different from baseline Arousal/Alertness: Awake/alert Orientation Level: Oriented X4 Attention: Selective Selective Attention: Impaired Selective Attention Impairment: Verbal basic Memory: Impaired Memory Impairment: Storage deficit, Retrieval deficit, Decreased recall of new information Problem Solving: Impaired Problem Solving Impairment: Verbal complex Cognition Arousal/Alertness: Awake/alert Behavior During Therapy: Flat affect Overall Cognitive Status: Impaired/Different from baseline Area of Impairment: Following commands, Awareness Following Commands: Follows one step commands with increased time Safety/Judgement: Decreased awareness of deficits Awareness: Emergent General Comments: pt very flat affect, depressed spirits, requires max encourgament to continue to participate, followed simple one step commands  Blood pressure 123/80, temperature 98.2 F  (36.8 C), temperature source Oral, resp. rate 16, height 5' 6"  (1.676 m), weight 67.1 kg, SpO2 96 %. Physical Exam Gen: no distress, normal appearing HEENT: poor dentition, chapped lips.  Cardio: Reg rate Chest: normal effort, normal rate of breathing Abd: soft, non-distended Ext: no edema Psych: pleasant, normal affect Skin: intact Neuro: Alert and oriented. Decrease sensation right upper and lower extremities >left. 5/5 strength throughout.   Results for orders placed or performed during the hospital encounter of 08/20/21 (from the past 48 hour(s))  Glucose, capillary     Status: Abnormal   Collection Time: 08/20/21  4:32 PM  Result Value Ref Range   Glucose-Capillary 132 (H) 70 - 99 mg/dL    Comment: Glucose reference range applies only to samples taken after fasting for at least 8 hours.   No results found.     Medical Problem List and Plan: 1. Functional deficits secondary to left caudate tail  infarct.  -patient may shower  -ELOS/Goals: 5-7 days S  -Admit to CIR 2.  Antithrombotics: -DVT/anticoagulation:  Pharmaceutical: Lovenox  -antiplatelet therapy: DAPT X 3 weeks followed by ASA alone on  09/08/21 3. Chronic pain/myalgias/Pain Management: Continue cymbalta 60 mg bid w/ gabapentin 600/600/1200  --will continue to hold Tizanidine.  4. Mood: LCSW to follow for evaluation and support.   -antipsychotic agents: N/A 5. Neuropsych: This patient is capable of making decisions on her own behalf. 6. Skin/Wound Care: Routine pressure relief measures.  7. Fluids/Electrolytes/Nutrition: Monitor I/O. Check lytes in am.  8. RLL PNA: Treated with ceftriaxone-->Omnicef 3 additional days to complete 5 day course antibiotic regimen  --h/o RLL PNA 11/22  9. Chronic Adrenal insufficiency: now back on hydrocortisone 10 mg daily 10. T2DM with neuropathy/autonomic dysfunction: Hgb A1C-13.3 and poorly controlled but improved from >15.5 at last admission. Continue to educate on compliance.     --continue Insulin glargine 55 units with novolog 6 units TID for meal coverage.   -discussed outpatient Qutenza and she is interested in trying.  --Monitor BS ac/hs. Continue SSI for elevated BS. Continue to titrate insulin for tighter BS control.   11. Orthostatic hypotension:  Treated w/1 liter fluid bolus 02/01 for hypotension.  --will order orthostatic vitals.  --continue Midodrine 10 mg TID for BP support.  12. Elevated lipids: Continue lipitor.  13. Chapped lips: ordered Blistex. 14. Periodontal disease: discussed her future plan for tooth removal and dentures. Discussed swishing coconut oil in mouth when she returns home for its antibacterial benefits.   I have personally performed a face to face diagnostic evaluation, including, but not limited to relevant history and physical exam findings, of this patient and developed relevant assessment and plan.  Additionally, I have reviewed and concur with the physician assistant's documentation above.  Bary Leriche, PA-C   Izora Ribas, MD 08/20/2021

## 2021-08-20 NOTE — Progress Notes (Signed)
Speech Language Pathology Treatment: Cognitive-Linquistic  Patient Details Name: [name] [name] MRN: [id number] DOB: 02-13-1961 Today's Date: 08/20/2021 Time: 0932-6712 SLP Time Calculation (min) (ACUTE ONLY): 30 min  Assessment / Plan / Recommendation Clinical Impression  Treatment focused on cognitive goals. Patient alert and cooperative. Initially with relatively flat affect which improved to appropriate as treatment progressed. She has made good improvement with short term goals. She was able to attend to moderately complex tasks and conversation with SLP for 25 minutes without distraction, recall 3/4 words following initial presentation, 4/4 with one repetition. She was then able to recall these same four words following both a one minute and then a five minute delay with 100% accuracy with independent use of compensatory strategy of repetition of information. Reinforced additional compensatory strategies, particularly use of written information for cueing when needed. She was able to complete moderately complex mathematical problem solving tasks today with min assist. Continue to recommend CIR (bed pending) for ongoing diagnostics and treatment of now high level cognitive deficits for return to independent living.    HPI HPI: Pt is a 61 yo female presenting with dizziness and blurred vision. MRI pending. PMH includes: chronic pain, uncontrolled DM, adrenal insufficiency, orthostatic hypotension, GERD      SLP Plan  Continue with current plan of care      Recommendations for follow up therapy are one component of a multi-disciplinary discharge planning process, led by the attending physician.  Recommendations may be updated based on patient status, additional functional criteria and insurance authorization.    Recommendations                  Follow Up Recommendations: Acute inpatient rehab (3hours/day) SLP Visit Diagnosis: Cognitive communication deficit (W58.099) Plan: Continue  with current plan of care         [facility] MA, [place]  08/20/2021, 9:57 AM

## 2021-08-20 NOTE — PMR Pre-admission (Signed)
PMR Admission Coordinator Pre-Admission Assessment  Patient: [name] [name] [name] is an 61 y.o., female MRN: [id number] DOB: Dec 09, 1960 Height: 5' 6"  (167.6 cm) Weight: 63.5 kg  Insurance Information HMO:     PPO:      PCP:      IPA:      80/20:      OTHER:  PRIMARY: Medicare a and b      Policy#: [id number]      Subscriber: pt Benefits:  Phone #: passport one source online     Name: 2/1 Eff. Date: 06/19/2021     Deduct: $1600      Out of Pocket Max: none      Life Max: none CIR: 100%      SNF: 20 full days Outpatient: 80%     Co-Pay: 20% Home Health: 100%      Co-Pay: none DME: 80%     Co-Pay: 20% Providers: pt choice  SECONDARY: [place] Medicaid Well care      Policy#: [id number]      08/20/21 approved by Darrel Reach phone [phone] fax [phone] Auth [id number] approved for 1 day as Medicare is primary. No updates needed per Ed on 08/20/21  Financial Counselor:       Phone#:   The Data Collection Information Summary for patients in Inpatient Rehabilitation Facilities with attached Privacy Act [place] Records was provided and verbally reviewed with: Patient  Emergency Contact Information Contact Information     Name Relation Home Work Mobile   [name] [name] [name] Daughter [phone]  [phone]   [name] [name] [name] [phone]  [phone]       Current Medical History  Patient Admitting Diagnosis: CVA  History of Present Illness: 61 year old female with medical history of chronic pain, uncontrolled DM, HTN, adrenal insufficieny and orthostatic hypotension secondary to autonomic dysfunction. Presented on 08/16/21 with dizziness and blurred vision. Symptoms began 4 days prior to admit.   CT images showed age indeterminate lacunar infarct, no evidence of large vessel occlusion, with up to 35 % ICA narrowing on the left. LDL 106, Hb A1c 13.1. Echo showed EF of 60 to 92%, grade 1 diastolic function. No intracardiac source of emboli. 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 Neurology recommended Asa and Plavix for 3 weeks followed by [name] [name] [name] alone. On Lipitor daily. Seen by ophthalmology due to blurred vision on the right.  CXR showed medical right lower pneumonia.a She is on room air. Initially started on Ceftriaxone, azithromycin. She was previously admitted in December on the same side. Will transition to orals.  History of adrenal insufficiency previously on Hydrocortisone daily but currently not taking due to compliance issues. She was given dose of steroids in ER and began on Hydrocortisone 10 mg daily.   Diabetes coordinator consulted for elevate Hgb A1c. TO continue insulin dose and monitor blood sugar with A1c of 13.3. Previously >15.Chronic sinus tachycardia with normal TSH. No room felt for beta blockers due to hypotension. DVT prophylaxis with Lovenox.  .  Complete NIHSS TOTAL: 1  Patient's medical record from [facility]  has been reviewed by the rehabilitation admission coordinator and physician.  Past Medical History  Past Medical History:  Diagnosis Date   Cervical disc disorder with radiculopathy of cervical region    Chronic pain syndrome    Degenerative joint disease    Depression dx 1997   Diabetes mellitus, type 2 ([place]) 2011   No insulin   Diabetic polyneuropathy (HCC)    GERD (gastroesophageal reflux disease)  Glaucoma    Headache(784.0)    Hypertension    Lab 11/2011:  CXR, EKG, CBC, TSH, BMet, troponin-normal; lipid profile: 188, 131, 36, 126    Lumbar herniated disc    Non-compliance    Pancreatic cyst    Endoscopic aspiration in 09/2009   Pneumonia 08/02/2012   Shingles    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 No  Family History   family history includes Depression in her mother; Diabetes in her mother; Heart attack in her brother; Heart failure in her maternal grandmother; Lung cancer in her father;  Renal Disease in her mother.  Current Medications  Current Facility-Administered Medications:    acetaminophen (TYLENOL) tablet 650 mg, 650 mg, Oral, Q6H PRN **OR** acetaminophen (TYLENOL) suppository 650 mg, 650 mg, Rectal, Q6H PRN, Karmen Bongo, MD   aspirin EC tablet 81 mg, 81 mg, Oral, Daily, Adhikari, Amrit, MD, 81 mg at 08/20/21 0936   atorvastatin (LIPITOR) tablet 40 mg, 40 mg, Oral, Daily, Adhikari, Amrit, MD, 40 mg at 08/20/21 0935   bisacodyl (DULCOLAX) EC tablet 5 mg, 5 mg, Oral, Daily PRN, Karmen Bongo, MD   cefdinir (OMNICEF) capsule 300 mg, 300 mg, Oral, Q12H, Adhikari, Amrit, MD, 300 mg at 08/20/21 0935   clopidogrel (PLAVIX) tablet 75 mg, 75 mg, Oral, Daily, Adhikari, Amrit, MD, 75 mg at 08/20/21 0936   docusate sodium (COLACE) capsule 100 mg, 100 mg, Oral, BID, Karmen Bongo, MD, 100 mg at 08/20/21 0935   DULoxetine (CYMBALTA) DR capsule 60 mg, 60 mg, Oral, BID, Karmen Bongo, MD, 60 mg at 08/20/21 0935   enoxaparin (LOVENOX) injection 40 mg, 40 mg, Subcutaneous, Q24H, Karmen Bongo, MD, 40 mg at 08/20/21 0831   gabapentin (NEURONTIN) capsule 1,200 mg, 1,200 mg, Oral, QHS, Reome, Earle J, RPH, 1,200 mg at 08/19/21 2202   gabapentin (NEURONTIN) capsule 600 mg, 600 mg, Oral, BID, Reome, Earle J, RPH, 600 mg at 08/20/21 0936   hydrocortisone (CORTEF) tablet 10 mg, 10 mg, Oral, Daily, Adhikari, Amrit, MD, 10 mg at 08/20/21 0935   insulin aspart (novoLOG) injection 0-15 Units, 0-15 Units, Subcutaneous, TID WC, Karmen Bongo, MD, 2 Units at 08/20/21 0648   insulin aspart (novoLOG) injection 0-5 Units, 0-5 Units, Subcutaneous, QHS, Karmen Bongo, MD, 3 Units at 08/17/21 2132   insulin aspart (novoLOG) injection 6 Units, 6 Units, Subcutaneous, TID WC, Adhikari, Amrit, MD, 6 Units at 08/20/21 0832   insulin glargine-yfgn (SEMGLEE) injection 55 Units, 55 Units, Subcutaneous, QHS, Adhikari, Amrit, MD, 55 Units at 08/19/21 2203   midodrine (PROAMATINE) tablet 10 mg, 10 mg,  Oral, TID WC, Karmen Bongo, MD, 10 mg at 08/20/21 0831   ondansetron (ZOFRAN) tablet 4 mg, 4 mg, Oral, Q6H PRN **OR** ondansetron (ZOFRAN) injection 4 mg, 4 mg, Intravenous, Q6H PRN, Karmen Bongo, MD   oxyCODONE (Oxy IR/ROXICODONE) immediate release tablet 5 mg, 5 mg, Oral, Q4H PRN, Karmen Bongo, MD   polyethylene glycol (MIRALAX / GLYCOLAX) packet 17 g, 17 g, Oral, Daily PRN, Karmen Bongo, MD   sodium chloride flush (NS) 0.9 % injection 3 mL, 3 mL, Intravenous, Q12H, Karmen Bongo, MD, 3 mL at 08/19/21 2204  Patients Current Diet:  Diet Order             Diet heart healthy/carb modified Room service appropriate? Yes; Fluid consistency: Thin  Diet effective now  Precautions / Restrictions Precautions Precautions: Fall Precaution Comments: Right homonymous hemianopia Restrictions Weight Bearing Restrictions: No   Has the patient had 2 or more falls or a fall with injury in the past year? No  Prior Activity Level Community (5-7x/wk): Independent and driving  Prior Functional Level Self Care: Did the patient need help bathing, dressing, using the toilet or eating? Independent  Indoor Mobility: Did the patient need assistance with walking from room to room (with or without device)? Independent  Stairs: Did the patient need assistance with internal or external stairs (with or without device)? Independent  Functional Cognition: Did the patient need help planning regular tasks such as shopping or remembering to take medications? Needed some help  Patient Information Are you of Hispanic, Latino/a,or Spanish origin?: A. No, not of Hispanic, Latino/a, or Spanish origin What is your race?: A. White Do you need or want an interpreter to communicate with a doctor or health care staff?: 0. No  Patient's Response To:  Health Literacy and Transportation Is the patient able to respond to health literacy and transportation needs?: Yes Health Literacy - How  often do you need to have someone help you when you read instructions, pamphlets, or other written material from your doctor or pharmacy?: Never In the past 12 months, has lack of transportation kept you from medical appointments or from getting medications?: No In the past 12 months, has lack of transportation kept you from meetings, work, or from getting things needed for daily living?: No  Home Assistive Devices / Equipment Home Equipment: Conservation officer, nature (2 wheels), [employer] - single point, Civil engineer, contracting, Grab bars - tub/shower  Prior Device Use: Indicate devices/aids used by the patient prior to current illness, exacerbation or injury?  SPC  Current Functional Level Cognition  Arousal/Alertness: Awake/alert Overall Cognitive Status: Within Functional Limits for tasks assessed Orientation Level: Oriented X4 Following Commands: Follows one step commands with increased time Safety/Judgement: Decreased awareness of deficits General Comments: Pt's granddaughter present at the start of the session (she works here). Pt with much improved affect, smiling and eager to participate. Short blessed assessment completed with score of 28/28. She required increased time, non distracting enviornment and was unable to multitask. Pt reports she sometimes has a hard time remebering things "normally." Attention: Selective Selective Attention: Impaired Selective Attention Impairment: Verbal basic Memory: Impaired Memory Impairment: Storage deficit, Retrieval deficit, Decreased recall of new information Problem Solving: Impaired Problem Solving Impairment: Verbal complex    Extremity Assessment (includes Sensation/Coordination)  Upper Extremity Assessment: RUE deficits/detail RUE Deficits / Details: R still weaker than left, slowed coordination. Using functionally throughout session. ROM is WFL RUE Sensation: WNL RUE Coordination: decreased fine motor  Lower Extremity Assessment: Defer to PT evaluation RLE  Deficits / Details: difficult to elicit responses to command for MMT  resorted to mobility to test strength.  Pt able to stand with minimal stability assist, same with gait, but pt appeared to need multimodal cuing for execution and general "cheering" her on to get responses. RLE Coordination: decreased fine motor LLE Coordination: decreased fine motor    ADLs  Overall ADL's : Needs assistance/impaired Eating/Feeding: Independent, Sitting Grooming: Supervision/safety, Standing Grooming Details (indicate cue type and reason): at the sink with increased time Upper Body Bathing: Set up, Sitting Lower Body Bathing: Minimal assistance, Sit to/from stand Upper Body Dressing : Set up, Sitting Lower Body Dressing: Minimal assistance, Sit to/from stand Lower Body Dressing Details (indicate cue type and reason): able to don socks while sitting EOB  Toilet Transfer: Supervision/safety, Conservation officer, nature (2 wheels), Ambulation Toilet Transfer Details (indicate cue type and reason): no physical assist required, incrased time required Toileting- Water quality scientist and Hygiene: Min guard, Sitting/lateral lean Functional mobility during ADLs: Supervision/safety, Rolling walker (2 wheels) General ADL Comments: pt requires increased time for all tasks assessed. slow shuffling gait despite cues and max encouragement    Mobility  Overal bed mobility: Needs Assistance Bed Mobility: Supine to Sit, Sit to Supine Supine to sit: Supervision Sit to supine: Supervision General bed mobility comments: cues only, incresaed time    Transfers  Overall transfer level: Needs assistance Equipment used: Rolling walker (2 wheels) Transfers: Sit to/from Stand Sit to Stand: Supervision General transfer comment: no assist needed, cues only    Ambulation / Gait / Stairs / Wheelchair Mobility  Ambulation/Gait Ambulation/Gait assistance: Herbalist (Feet): 120 Feet Assistive device: Rolling walker (2  wheels) Gait Pattern/deviations: Step-through pattern, Decreased stride length, Shuffle General Gait Details: attemptd to amb with R HHA however pt unsteady and reaching with L UE to hold onto something, transitioned to RW, pt with increased stability however continues to brace self on UEs with short step height and length in steps. minA to maintain forward progression of walker to improved fluidity of gait, very slow, minA also to not vear R with RW Gait velocity: slower Gait velocity interpretation: <1.8 ft/sec, indicate of risk for recurrent falls    Posture / Balance Balance Overall balance assessment: Needs assistance Sitting-balance support: Feet supported Sitting balance-Leahy Scale: Fair Standing balance support: No upper extremity supported, During functional activity Standing balance-Leahy Scale: Fair Standing balance comment: requires bilat UE support for stability    Special needs/care consideration Hgb A1c 13.3 [name] [name]/Daughter prior to admit manage her Meds with alarm system box system for reminder when to take her meds   Previous Home Environment  Living Arrangements: Other (Comment) (lives with roommate)  Lives With: Friend(s) Available Help at Discharge: Friend(s), Available PRN/intermittently Type of Home: House Home Layout: One level Home Access: Stairs to enter Technical brewer of Steps: 1 Bathroom Shower/Tub: Chiropodist: Standard Bathroom Accessibility: Yes How Accessible: Accessible via walker Home Care Services: No Additional Comments: pt states her friend cannot assist her "She is in as bad of shape as I am."  Discharge Living Setting Plans for Discharge Living Setting: Patient's home, Lives with (comment) (roommate/friend) Type of Home at Discharge: House Discharge Home Layout: One level Discharge Home Access: Stairs to enter Entrance Stairs-Rails: None Entrance Stairs-Number of Steps: 1 Discharge Bathroom Shower/Tub:  Tub/shower unit Discharge Bathroom Toilet: Standard Discharge Bathroom Accessibility: Yes How Accessible: Accessible via walker Does the patient have any problems obtaining your medications?: No  Social/Family/Support Systems Contact Information: daughter, [name] [name] [name] Anticipated Caregiver: daughter Anticipated Caregiver's Contact Information: see contacts Ability/Limitations of Caregiver: Caesar Chestnut works Building control surveyor Availability: Intermittent Discharge Plan Discussed with Primary Caregiver: Yes Is Caregiver In Agreement with Plan?: Yes Does Caregiver/Family have Issues with Lodging/Transportation while Pt is in Rehab?: No  Goals Patient/Family Goal for Rehab: Mod I to intermittent supervision with PT, OT and SLP Expected length of stay: ELOS 7 to 10 days Additional Information: [facility] sets up her meds with an alarm system to remind to take her meds Pt/Family Agrees to Admission and willing to participate: Yes Program Orientation Provided & Reviewed with Pt/Caregiver Including Roles  & Responsibilities: Yes  Decrease burden of Care through IP rehab admission: n/a  Possible need for SNF placement upon discharge: not anticipated  Patient Condition:  I have reviewed medical records from [facility] , spoken with CM, and patient and daughter. I met with patient at the bedside for inpatient rehabilitation assessment.  Patient will benefit from ongoing PT, OT, and SLP, can actively participate in 3 hours of therapy a day 5 days of the week, and can make measurable gains during the admission.  Patient will also benefit from the coordinated team approach during an Inpatient Acute Rehabilitation admission.  The patient will receive intensive therapy as well as Rehabilitation physician, nursing, social worker, and care management interventions.  Due to bladder management, bowel management, safety, skin/wound care, disease management, medication administration, pain management, and patient  education the patient requires 24 hour a day rehabilitation nursing.  The patient is currently min assist overall with mobility and basic ADLs.  Discharge setting and therapy post discharge at home with home health is anticipated.  Patient has agreed to participate in the Acute Inpatient Rehabilitation Program and will admit today.  Preadmission Screen Completed By:  [name] [name] [name], 08/20/2021 11:00 AM ______________________________________________________________________   Discussed status with Dr. Ranell Patrick on 08/20/2021 at 1117 and received approval for admission today.  Admission Coordinator:  [name] [name] Burke, RN, time  5001 Date  08/20/2021   Assessment/Plan: Diagnosis: CVA Does the need for close, 24 hr/day Medical supervision in concert with the patient's rehab needs make it unreasonable for this patient to be served in a less intensive setting? Yes Co-Morbidities requiring supervision/potential complications: orthostatic hypotension, uncontrolled type 2 DM, current severe episode of MDD, visual field constriction of right eye, neck muscle spasm Due to bladder management, bowel management, safety, skin/wound care, disease management, medication administration, pain management, and patient education, does the patient require 24 hr/day rehab nursing? Yes Does the patient require coordinated care of a physician, rehab nurse, PT, OT, and SLP to address physical and functional deficits in the context of the above medical diagnosis(es)? Yes Addressing deficits in the following areas: balance, endurance, locomotion, strength, transferring, bowel/bladder control, bathing, dressing, feeding, grooming, toileting, cognition, and psychosocial support Can the patient actively participate in an intensive therapy program of at least 3 hrs of therapy 5 days a week? Yes The potential for patient to make measurable gains while on inpatient rehab is excellent Anticipated functional outcomes upon  discharge from inpatient rehab: supervision PT, supervision OT, supervision SLP Estimated rehab length of stay to reach the above functional goals is: 5-7 days Anticipated discharge destination: Home 10. Overall Rehab/Functional Prognosis: excellent   MD Signature: Leeroy Cha, MD

## 2021-08-20 NOTE — Care Management Important Message (Signed)
Important Message  Patient Details  Name: [name] [name] MRN: [id number] Date of Birth: 1961-05-10   Medicare Important Message Given:  Yes     Elaysha Bevard Montine Circle 08/20/2021, 3:07 PM

## 2021-08-20 NOTE — Progress Notes (Signed)
Physical Therapy Treatment Patient Details Name: [name] [name] MRN: [id number] DOB: 02/28/1961 Today's Date: 08/20/2021   History of Present Illness [name] [name] is a 61 y.o. female who presents with right sided visual field deficits and dizziness. CT (+) lacunar infarct at the left caudate body. Pt with a history of chronic pain, uncontrolled DM (with a recent episode of DKA and A1C >15), HTN, adrenal insufficiency and orthostatic hypotension    PT Comments    Pt much improved today. Pt with increased step height and improved fluidity of gait today. Pt continues with bilat LE weakness. Focused on standing and seated exercises today. Pt completed SLS on R and L with support of UEs x 5 trials, squats, seated adduction, and resisted LAQ. Acute PT to cont to follow.    Recommendations for follow up therapy are one component of a multi-disciplinary discharge planning process, led by the attending physician.  Recommendations may be updated based on patient status, additional functional criteria and insurance authorization.  Follow Up Recommendations  Acute inpatient rehab (3hours/day)     Assistance Recommended at Discharge Frequent or constant Supervision/Assistance  Patient can return home with the following A little help with walking and/or transfers;A little help with bathing/dressing/bathroom;Assistance with cooking/housework;Direct supervision/assist for medications management;Direct supervision/assist for financial management;Assist for transportation;Help with stairs or ramp for entrance   Equipment Recommendations   (TBD)    Recommendations for Other Services Rehab consult     Precautions / Restrictions Precautions Precautions: Fall Precaution Comments: Right homonymous hemianopia Restrictions Weight Bearing Restrictions: No     Mobility  Bed Mobility Overal bed mobility: Needs Assistance Bed Mobility: Supine to Sit     Supine to sit: Supervision     General bed  mobility comments: HOB elevated, no use of bed rails    Transfers Overall transfer level: Needs assistance Equipment used: Rolling walker (2 wheels) Transfers: Sit to/from Stand Sit to Stand: Min guard           General transfer comment: no physical assist needed, verbal cues for hand placement    Ambulation/Gait Ambulation/Gait assistance: Min guard Gait Distance (Feet): 120 Feet Assistive device: Rolling walker (2 wheels) Gait Pattern/deviations: Step-through pattern, Decreased stride length, Shuffle Gait velocity: slow Gait velocity interpretation: <1.31 ft/sec, indicative of household ambulator   General Gait Details: pt with increased pace and more fluid gait pattern compared to yesterdays session, pt with consistent foot clearance and no shuffling this session, pt with good control of RW   Stairs             Wheelchair Mobility    Modified Rankin (Stroke Patients Only) Modified Rankin (Stroke Patients Only) Pre-Morbid Rankin Score: No symptoms Modified Rankin: Moderately severe disability     Balance Overall balance assessment: Needs assistance Sitting-balance support: Feet supported Sitting balance-Leahy Scale: Good     Standing balance support: Bilateral upper extremity supported Standing balance-Leahy Scale: Fair Standing balance comment: requires bilat UE support for stability                            Cognition Arousal/Alertness: Awake/alert Behavior During Therapy: WFL for tasks assessed/performed Overall Cognitive Status: Within Functional Limits for tasks assessed                                 General Comments: Pt with improved spirits compared to yesterdays session  Exercises      General Comments General comments (skin integrity, edema, etc.): VSS      Pertinent Vitals/Pain Pain Assessment Pain Assessment: No/denies pain    Home Living                          Prior Function             PT Goals (current goals can now be found in the care plan section) Progress towards PT goals: Progressing toward goals    Frequency    Min 4X/week      PT Plan Current plan remains appropriate    Co-evaluation              AM-PAC PT "6 Clicks" Mobility   Outcome Measure  Help needed turning from your back to your side while in a flat bed without using bedrails?: A Little Help needed moving from lying on your back to sitting on the side of a flat bed without using bedrails?: A Little Help needed moving to and from a bed to a chair (including a wheelchair)?: A Little Help needed standing up from a chair using your arms (e.g., wheelchair or bedside chair)?: A Little Help needed to walk in hospital room?: A Little Help needed climbing 3-5 steps with a railing? : A Lot 6 Click Score: 17    End of Session Equipment Utilized During Treatment: Gait belt Activity Tolerance: Patient tolerated treatment well Patient left: in chair;with call bell/phone within reach;with chair alarm set Nurse Communication: Mobility status PT Visit Diagnosis: Other abnormalities of gait and mobility (R26.89);Difficulty in walking, not elsewhere classified (R26.2);Other symptoms and signs involving the nervous system (R29.898)     Time: 4680-3212 PT Time Calculation (min) (ACUTE ONLY): 22 min  Charges:  $Gait Training: 8-22 mins                     Kittie Plater, PT, DPT Acute Rehabilitation Services Pager #: [phone] Office #: [phone]    Berline Lopes 08/20/2021, 12:50 PM

## 2021-08-20 NOTE — Progress Notes (Signed)
Izora Ribas, MD  Physician Physical Medicine and Rehabilitation PMR Pre-admission    Signed Date of Service:  08/20/2021 11:00 AM  Related encounter: ED to Hosp-Admission (Discharged) from 08/16/2021 in [facility]   Signed      Show:Clear all [x] Written[x] Templated[] Copied  Added by: [x] Cristina Gong, RN[x] Raulkar, Clide Deutscher, MD  [] Hover for details                                                                                                                                                                                                                                                                                                                      PMR Admission Coordinator Pre-Admission Assessment   Patient: [name] [name] is an 61 y.o., female MRN: [id number] DOB: 02-Jul-1961 Height: 5' 6"  (167.6 cm) Weight: 63.5 kg   Insurance Information HMO:     PPO:      PCP:      IPA:      80/20:      OTHER:  PRIMARY: Medicare a and b      Policy#: [id number]      Subscriber: [name] [name] Benefits:  Phone #: passport one source online     Name: 2/1 Eff. Date: 06/19/2021     Deduct: $1600      Out of Pocket Max: none      Life Max: none CIR: 100%      SNF: 20 full days Outpatient: 80%     Co-Pay: 20% Home Health: 100%      Co-Pay: none DME: 80%     Co-Pay: 20% Providers: [name] [name] choice  SECONDARY: [place] Medicaid Well care      Policy#: [id number]      08/20/21 approved by Darrel Reach phone [phone] fax [phone] Auth [id number] approved for 1 day as Medicare is primary. No updates needed per Ed on 08/20/21   Financial Counselor:       Phone#:    The [employer] for patients  in Inpatient Rehabilitation Facilities with attached [place] Records was provided and verbally reviewed with: Patient   Emergency Contact  Information Contact Information       Name Relation Home Work Mobile    [name],[name] Daughter [phone]   [phone]    Toy Cookey [phone]   [phone]           Current Medical History  Patient Admitting Diagnosis: CVA   History of Present Illness: 61 year old female with medical history of chronic pain, uncontrolled DM, HTN, adrenal insufficieny and orthostatic hypotension secondary to autonomic dysfunction. Presented on 08/16/21 with dizziness and blurred vision. Symptoms began 4 days prior to admit.    CT images showed age indeterminate lacunar infarct, no evidence of large vessel occlusion, with up to 35 % ICA narrowing on the left. LDL 106, Hb A1c 13.1. Echo showed EF of 60 to 10%, grade 1 diastolic function. No intracardiac source of emboli. MRI showed left caudate tail acute infarct. Neurology recommended Asa and Plavix for 3 weeks followed by Diona Fanti alone. On Lipitor daily. Seen by ophthalmology due to blurred vision on the right.   CXR showed medical right lower pneumonia.a She is on room air. Initially started on Ceftriaxone, azithromycin. She was previously admitted in December on the same side. Will transition to orals.   History of adrenal insufficiency previously on Hydrocortisone daily but currently not taking due to compliance issues. She was given dose of steroids in ER and began on Hydrocortisone 10 mg daily.    Diabetes coordinator consulted for elevate Hgb A1c. TO continue insulin dose and monitor blood sugar with A1c of 13.3. Previously >15.Chronic sinus tachycardia with normal TSH. No room felt for beta blockers due to hypotension. DVT prophylaxis with Lovenox.  .  Complete NIHSS TOTAL: 1   Patient's medical record from [facility]  has been reviewed by the rehabilitation admission coordinator and physician.   Past Medical History      Past Medical History:  Diagnosis Date   Cervical disc disorder with radiculopathy of cervical region      Chronic pain syndrome     Degenerative joint disease     Depression dx 1997   Diabetes mellitus, type 2 ([place]) 2011    No insulin   Diabetic polyneuropathy (HCC)     GERD (gastroesophageal reflux disease)     Glaucoma     Headache(784.0)     Hypertension      Lab 11/2011:  CXR, EKG, CBC, TSH, BMet, troponin-normal; lipid profile: 188, 131, 36, 126    Lumbar herniated disc     Non-compliance     Pancreatic cyst      Endoscopic aspiration in 09/2009   Pneumonia 08/02/2012   Shingles     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 No   Family History   family history includes Depression in her mother; Diabetes in her mother; Heart attack in her brother; Heart failure in her maternal grandmother; Lung cancer in her father; Renal Disease in her mother.   Current Medications   Current Facility-Administered Medications:    acetaminophen (TYLENOL) tablet 650 mg, 650 mg, Oral, Q6H PRN **OR** acetaminophen (TYLENOL) suppository 650 mg, 650 mg, Rectal, Q6H PRN, Karmen Bongo, MD   aspirin EC tablet 81 mg, 81 mg, Oral, Daily,  Shelly Coss, MD, 81 mg at 08/20/21 3016   atorvastatin (LIPITOR) tablet 40 mg, 40 mg, Oral, Daily, Adhikari, Amrit, MD, 40 mg at 08/20/21 0935   bisacodyl (DULCOLAX) EC tablet 5 mg, 5 mg, Oral, Daily PRN, Karmen Bongo, MD   cefdinir (OMNICEF) capsule 300 mg, 300 mg, Oral, Q12H, Adhikari, Amrit, MD, 300 mg at 08/20/21 0935   clopidogrel (PLAVIX) tablet 75 mg, 75 mg, Oral, Daily, Adhikari, Amrit, MD, 75 mg at 08/20/21 0936   docusate sodium (COLACE) capsule 100 mg, 100 mg, Oral, BID, Karmen Bongo, MD, 100 mg at 08/20/21 0935   DULoxetine (CYMBALTA) DR capsule 60 mg, 60 mg, Oral, BID, Karmen Bongo, MD, 60 mg at 08/20/21 0935   enoxaparin (LOVENOX) injection 40 mg, 40 mg, Subcutaneous, Q24H, Karmen Bongo, MD, 40 mg at 08/20/21 0831   gabapentin  (NEURONTIN) capsule 1,200 mg, 1,200 mg, Oral, QHS, Reome, Earle J, RPH, 1,200 mg at 08/19/21 2202   gabapentin (NEURONTIN) capsule 600 mg, 600 mg, Oral, BID, Reome, Earle J, RPH, 600 mg at 08/20/21 0936   hydrocortisone (CORTEF) tablet 10 mg, 10 mg, Oral, Daily, Adhikari, Amrit, MD, 10 mg at 08/20/21 0935   insulin aspart (novoLOG) injection 0-15 Units, 0-15 Units, Subcutaneous, TID WC, Karmen Bongo, MD, 2 Units at 08/20/21 0109   insulin aspart (novoLOG) injection 0-5 Units, 0-5 Units, Subcutaneous, QHS, Karmen Bongo, MD, 3 Units at 08/17/21 2132   insulin aspart (novoLOG) injection 6 Units, 6 Units, Subcutaneous, TID WC, Shelly Coss, MD, 6 Units at 08/20/21 0832   insulin glargine-yfgn (SEMGLEE) injection 55 Units, 55 Units, Subcutaneous, QHS, Adhikari, Tamsen Meek, MD, 55 Units at 08/19/21 2203   midodrine (PROAMATINE) tablet 10 mg, 10 mg, Oral, TID WC, Karmen Bongo, MD, 10 mg at 08/20/21 0831   ondansetron (ZOFRAN) tablet 4 mg, 4 mg, Oral, Q6H PRN **OR** ondansetron (ZOFRAN) injection 4 mg, 4 mg, Intravenous, Q6H PRN, Karmen Bongo, MD   oxyCODONE (Oxy IR/ROXICODONE) immediate release tablet 5 mg, 5 mg, Oral, Q4H PRN, Karmen Bongo, MD   polyethylene glycol (MIRALAX / GLYCOLAX) packet 17 g, 17 g, Oral, Daily PRN, Karmen Bongo, MD   sodium chloride flush (NS) 0.9 % injection 3 mL, 3 mL, Intravenous, Q12H, Karmen Bongo, MD, 3 mL at 08/19/21 2204   Patients Current Diet:  Diet Order                  Diet heart healthy/carb modified Room service appropriate? Yes; Fluid consistency: Thin  Diet effective now                       Precautions / Restrictions Precautions Precautions: Fall Precaution Comments: Right homonymous hemianopia Restrictions Weight Bearing Restrictions: No    Has the patient had 2 or more falls or a fall with injury in the past year? No   Prior Activity Level Community (5-7x/wk): Independent and driving   Prior Functional Level Self Care: Did  the patient need help bathing, dressing, using the toilet or eating? Independent   Indoor Mobility: Did the patient need assistance with walking from room to room (with or without device)? Independent   Stairs: Did the patient need assistance with internal or external stairs (with or without device)? Independent   Functional Cognition: Did the patient need help planning regular tasks such as shopping or remembering to take medications? Needed some help   Patient Information Are you of Hispanic, Latino/a,or Spanish origin?: A. No, not of Hispanic, Latino/a, or Spanish origin What is  your race?: A. White Do you need or want an interpreter to communicate with a doctor or health care staff?: 0. No   Patient's Response To:  Health Literacy and Transportation Is the patient able to respond to health literacy and transportation needs?: Yes Health Literacy - How often do you need to have someone help you when you read instructions, pamphlets, or other written material from your doctor or pharmacy?: Never In the past 12 months, has lack of transportation kept you from medical appointments or from getting medications?: No In the past 12 months, has lack of transportation kept you from meetings, work, or from getting things needed for daily living?: No   Home Assistive Devices / Equipment Home Equipment: Conservation officer, nature (2 wheels), [employer] - single point, Civil engineer, contracting, Grab bars - tub/shower   Prior Device Use: Indicate devices/aids used by the patient prior to current illness, exacerbation or injury?  SPC   Current Functional Level Cognition   Arousal/Alertness: Awake/alert Overall Cognitive Status: Within Functional Limits for tasks assessed Orientation Level: Oriented X4 Following Commands: Follows one step commands with increased time Safety/Judgement: Decreased awareness of deficits General Comments: [name] [name]'s granddaughter present at the start of the session (she works here). [name] [name] with much improved  affect, smiling and eager to participate. Short blessed assessment completed with score of 28/28. She required increased time, non distracting enviornment and was unable to multitask. [name] [name] reports she sometimes has a hard time remebering things "normally." Attention: Selective Selective Attention: Impaired Selective Attention Impairment: Verbal basic Memory: Impaired Memory Impairment: Storage deficit, Retrieval deficit, Decreased recall of new information Problem Solving: Impaired Problem Solving Impairment: Verbal complex    Extremity Assessment (includes Sensation/Coordination)   Upper Extremity Assessment: RUE deficits/detail RUE Deficits / Details: R still weaker than left, slowed coordination. Using functionally throughout session. ROM is WFL RUE Sensation: WNL RUE Coordination: decreased fine motor  Lower Extremity Assessment: Defer to [name] [name] evaluation RLE Deficits / Details: difficult to elicit responses to command for MMT  resorted to mobility to test strength.  [name] [name] able to stand with minimal stability assist, same with gait, but [name] [name] appeared to need multimodal cuing for execution and general "cheering" her on to get responses. RLE Coordination: decreased fine motor LLE Coordination: decreased fine motor     ADLs   Overall ADL's : Needs assistance/impaired Eating/Feeding: Independent, Sitting Grooming: Supervision/safety, Standing Grooming Details (indicate cue type and reason): at the sink with increased time Upper Body Bathing: Set up, Sitting Lower Body Bathing: Minimal assistance, Sit to/from stand Upper Body Dressing : Set up, Sitting Lower Body Dressing: Minimal assistance, Sit to/from stand Lower Body Dressing Details (indicate cue type and reason): able to don socks while sitting EOB Toilet Transfer: Supervision/safety, Rolling walker (2 wheels), Ambulation Toilet Transfer Details (indicate cue type and reason): no physical assist required, incrased time required Toileting-  Clothing Manipulation and Hygiene: Min guard, Sitting/lateral lean Functional mobility during ADLs: Supervision/safety, Rolling walker (2 wheels) General ADL Comments: [name] [name] requires increased time for all tasks assessed. slow shuffling gait despite cues and max encouragement     Mobility   Overal bed mobility: Needs Assistance Bed Mobility: Supine to Sit, Sit to Supine Supine to sit: Supervision Sit to supine: Supervision General bed mobility comments: cues only, incresaed time     Transfers   Overall transfer level: Needs assistance Equipment used: Rolling walker (2 wheels) Transfers: Sit to/from Stand Sit to Stand: Supervision General transfer comment: no assist needed, cues only     Ambulation /  Gait / Stairs / Emergency planning/management officer   Ambulation/Gait Ambulation/Gait assistance: Herbalist (Feet): 120 Feet Assistive device: Rolling walker (2 wheels) Gait Pattern/deviations: Step-through pattern, Decreased stride length, Shuffle General Gait Details: attemptd to amb with R HHA however [name] [name] unsteady and reaching with L UE to hold onto something, transitioned to RW, [name] [name] with increased stability however continues to brace self on UEs with short step height and length in steps. minA to maintain forward progression of walker to improved fluidity of gait, very slow, minA also to not vear R with RW Gait velocity: slower Gait velocity interpretation: <1.8 ft/sec, indicate of risk for recurrent falls     Posture / Balance Balance Overall balance assessment: Needs assistance Sitting-balance support: Feet supported Sitting balance-Leahy Scale: Fair Standing balance support: No upper extremity supported, During functional activity Standing balance-Leahy Scale: Fair Standing balance comment: requires bilat UE support for stability     Special needs/care consideration Hgb A1c 13.3 [name]/Daughter prior to admit manage her Meds with alarm system box system for reminder when to take her  meds    Previous Home Environment  Living Arrangements: Other (Comment) (lives with roommate)  Lives With: Friend(s) Available Help at Discharge: Friend(s), Available PRN/intermittently Type of Home: House Home Layout: One level Home Access: Stairs to enter Technical brewer of Steps: 1 Bathroom Shower/Tub: Chiropodist: Standard Bathroom Accessibility: Yes How Accessible: Accessible via walker Home Care Services: No Additional Comments: [name] [name] states her friend cannot assist her "She is in as bad of shape as I am."   Discharge Living Setting Plans for Discharge Living Setting: Patient's home, Lives with (comment) (roommate/friend) Type of Home at Discharge: House Discharge Home Layout: One level Discharge Home Access: Stairs to enter Entrance Stairs-Rails: None Entrance Stairs-Number of Steps: 1 Discharge Bathroom Shower/Tub: Tub/shower unit Discharge Bathroom Toilet: Standard Discharge Bathroom Accessibility: Yes How Accessible: Accessible via walker Does the patient have any problems obtaining your medications?: No   Social/Family/Support Systems Contact Information: daughter, [name] [name] Anticipated Caregiver: daughter Anticipated Caregiver's Contact Information: see contacts Ability/Limitations of Caregiver: Caesar Chestnut works Building control surveyor Availability: Intermittent Discharge Plan Discussed with Primary Caregiver: Yes Is Caregiver In Agreement with Plan?: Yes Does Caregiver/Family have Issues with Lodging/Transportation while [name] [name] is in Rehab?: No   Goals Patient/Family Goal for Rehab: Mod I to intermittent supervision with [name] [name], OT and SLP Expected length of stay: ELOS 7 to 10 days Additional Information: [facility] sets up her meds with an alarm system to remind to take her meds [name] [name]/Family Agrees to Admission and willing to participate: Yes Program Orientation Provided & Reviewed with [name] [name]/Caregiver Including Roles  & Responsibilities: Yes   Decrease burden of  Care through IP rehab admission: n/a   Possible need for SNF placement upon discharge: not anticipated   Patient Condition: I have reviewed medical records from [facility] , spoken with CM, and patient and daughter. I met with patient at the bedside for inpatient rehabilitation assessment.  Patient will benefit from ongoing [name] [name], OT, and SLP, can actively participate in 3 hours of therapy a day 5 days of the week, and can make measurable gains during the admission.  Patient will also benefit from the coordinated team approach during an Inpatient Acute Rehabilitation admission.  The patient will receive intensive therapy as well as Rehabilitation physician, nursing, social worker, and care management interventions.  Due to bladder management, bowel management, safety, skin/wound care, disease management, medication administration, pain management, and patient education the patient requires 24 hour a  day rehabilitation nursing.  The patient is currently min assist overall with mobility and basic ADLs.  Discharge setting and therapy post discharge at home with home health is anticipated.  Patient has agreed to participate in the Acute Inpatient Rehabilitation Program and will admit today.   Preadmission Screen Completed By:  Cleatrice Burke, 08/20/2021 11:00 AM ______________________________________________________________________   Discussed status with Dr. Ranell Patrick on 08/20/2021 at 1117 and received approval for admission today.   Admission Coordinator:  Cleatrice Burke, RN, time  5188 Date  08/20/2021    Assessment/Plan: Diagnosis: CVA Does the need for close, 24 hr/day Medical supervision in concert with the patient's rehab needs make it unreasonable for this patient to be served in a less intensive setting? Yes Co-Morbidities requiring supervision/potential complications: orthostatic hypotension, uncontrolled type 2 DM, current severe episode of MDD, visual field constriction of  right eye, neck muscle spasm Due to bladder management, bowel management, safety, skin/wound care, disease management, medication administration, pain management, and patient education, does the patient require 24 hr/day rehab nursing? Yes Does the patient require coordinated care of a physician, rehab nurse, [name] [name], OT, and SLP to address physical and functional deficits in the context of the above medical diagnosis(es)? Yes Addressing deficits in the following areas: balance, endurance, locomotion, strength, transferring, bowel/bladder control, bathing, dressing, feeding, grooming, toileting, cognition, and psychosocial support Can the patient actively participate in an intensive therapy program of at least 3 hrs of therapy 5 days a week? Yes The potential for patient to make measurable gains while on inpatient rehab is excellent Anticipated functional outcomes upon discharge from inpatient rehab: supervision [name] [name], supervision OT, supervision SLP Estimated rehab length of stay to reach the above functional goals is: 5-7 days Anticipated discharge destination: Home 10. Overall Rehab/Functional Prognosis: excellent     MD Signature: Leeroy Cha, MD         Revision History                     Note Details  Author Izora Ribas, MD File Time 08/20/2021 11:31 AM  Author Type Physician Status Signed  Last Editor Izora Ribas, MD Service Physical Medicine and [place] # [phone] Admit Date 08/20/2021

## 2021-08-20 NOTE — Discharge Summary (Signed)
Physician Discharge Summary   Patient: [name] [name] MRN: [id number] DOB: [birth year]  Admit date:     08/16/2021  Discharge date: 08/20/21  Discharge Physician: Alma Friendly   PCP: Charlott Rakes, MD   Recommendations at discharge:   Follow-up with rehab at [facility] Follow-up with PCP after being discharged from [facility]   Discharge Diagnoses: Principal Problem:   Orthostatic hypotension Active Problems:   Uncontrolled type 2 diabetes mellitus with hyperglycemia, with long-term current use of insulin (HCC)   Current severe episode of major depressive disorder without psychotic features (HCC)   Visual field constriction of right eye   DNR (do not resuscitate)     Hospital Course: Patient is a 61 year old female with history of chronic pain syndrome, uncontrolled diabetes with A1c more than 15, adrenal insufficiency, orthostatic hypotension secondary to autonomic dysfunction who presented with dizziness, blurred vision.  Symptoms started 4 days before admission.  As per the report, she was noncompliant with her medications.  On presentation orthostatic vital signs were positive.  Patient was complaining of bilateral blurry vision so ophthalmology consulted.  CT imagings showed age-indeterminate lacunar infarct, no evidence of large vessel occlusion, cervical carotid atherosclerosis.  Chest x-ray showed medial right lower lobe pneumonia.  MRI showed  left caudate tail acute infarct.  Stroke work-up completed. Medically stable for discharge to CIR    Today, patient denies any new complaints, stable for discharge to CIR for further rehab needs.    Assessment and Plan:  Acute ischemic stroke MRI of the brain showed left caudate tail acute infarct CT angio head and neck showed no emergent vascular finding,cervical carotid atherosclerosis with up to 35% ICA narrowing on the left. LDL of 106, hemoglobin A1c of 13.3 Echo showed EF of 60 to 27%, grade 1 diastolic function, no intracardiac  source of emboli Neurology recommended aspirin and Plavix for 3 weeks followed by aspirin alone Lipitor 40 mg daily PT/OT recommending CIR   Right-sided pneumonia Chest x-ray showed medial right lower lobe pneumonia Currently on room air S/P ceftriaxone ,azithromycin, will DC on oral cefdinir    Chronic orthostatic hypotension History of autonomic dysfunction, adrenal insufficiency Orthostatic vitals were positive. Continue midodrine Fall precautions.     History of adrenal insufficiency Continue hydrocortisone 10 mg daily as well as upon d/c   R homonymous hemianopia Noted loss of lateral visual field on the right Seen by ophthalmology Advised not to drive until peripheral vision is restored   Uncontrolled diabetes mellitus with hyperglycemia A1c of 13.3 History of noncompliance Continue insulin at current dose.  Monitor blood sugars.     Chronic sinus tachycardia Chronic problem.  Normal TSH.  No room for starting on beta-blocker due to hypotension   Chronic pain syndrome CT cervical spine showed multilevel degenerative disc disease.  Continue supportive care     Consultants: Neurology Procedures performed: None Disposition: Rehabilitation facility Diet recommendation:  Cardiac and Carb modified diet  DISCHARGE MEDICATION: Allergies as of 08/20/2021       Reactions   Tramadol Nausea And Vomiting   REACTION: Projectile vomiting        Medication List     STOP taking these medications    guaiFENesin-dextromethorphan 100-10 MG/5ML syrup Commonly known as: ROBITUSSIN DM   HumaLOG KwikPen 100 UNIT/ML KwikPen Generic drug: insulin lispro   Lantus SoloStar 100 UNIT/ML Solostar Pen Generic drug: insulin glargine       TAKE these medications    Accu-Chek Guide test strip Generic drug: glucose blood Use  as instructed tid   Accu-Chek Guide w/Device Kit Use as directed tid   Accu-Chek Softclix Lancets lancets Use as instructed tid before meals    acetaminophen 500 MG tablet Commonly known as: TYLENOL Take 2 tablets (1,000 mg total) by mouth every 6 (six) hours as needed for mild pain or headache (pain).   aspirin 81 MG EC tablet Take 1 tablet (81 mg total) by mouth daily. Swallow whole. Start taking on: August 21, 2021   atorvastatin 40 MG tablet Commonly known as: LIPITOR Take 1 tablet (40 mg total) by mouth daily. Start taking on: August 21, 2021   cefdinir 300 MG capsule Commonly known as: OMNICEF Take 1 capsule (300 mg total) by mouth every 12 (twelve) hours for 3 doses.   clopidogrel 75 MG tablet Commonly known as: PLAVIX Take 1 tablet (75 mg total) by mouth daily for 19 days. Start taking on: August 21, 2021   docusate sodium 100 MG capsule Commonly known as: COLACE Take 1 capsule (100 mg total) by mouth 2 (two) times daily.   DULoxetine 60 MG capsule Commonly known as: CYMBALTA Take 1 capsule (60 mg total) by mouth 2 (two) times daily.   gabapentin 600 MG tablet Commonly known as: NEURONTIN TAKE ONE TABLET (600 MG) BY MOUTH EVERY MORNING AND AFTERNOON, TAKE TWO TABLETS AT NIGHT What changed:  how much to take how to take this when to take this   hydrocortisone 10 MG tablet Commonly known as: CORTEF Take 1 tablet (10 mg total) by mouth daily. Start taking on: August 21, 2021   insulin aspart 100 UNIT/ML injection Commonly known as: novoLOG Inject 0-15 Units into the skin 3 (three) times daily with meals.   insulin aspart 100 UNIT/ML injection Commonly known as: novoLOG Inject 0-5 Units into the skin at bedtime.   insulin aspart 100 UNIT/ML injection Commonly known as: novoLOG Inject 6 Units into the skin 3 (three) times daily with meals.   insulin glargine-yfgn 100 UNIT/ML injection Commonly known as: SEMGLEE Inject 0.55 mLs (55 Units total) into the skin at bedtime.   midodrine 10 MG tablet Commonly known as: PROAMATINE Take 1 tablet (10 mg total) by mouth 3 (three) times daily with  meals.   Pen Needles 31G X 8 MM Misc Use as directed   polyethylene glycol 17 g packet Commonly known as: MIRALAX / GLYCOLAX Take 17 g by mouth daily as needed for mild constipation.   tiZANidine 4 MG tablet Commonly known as: ZANAFLEX Take 4 mg by mouth every 6 (six) hours as needed for muscle spasms.        Follow-up Information     Charlott Rakes, MD. Schedule an appointment as soon as possible for a visit in 1 week(s).   Specialty: Family Medicine Why: after d/c from rehab Contact information: [place] [facility] [address] [phone]         Lorretta Harp, MD .   Specialties: Cardiology, Radiology Contact information: [address] [place] [address] [phone]                 Discharge Exam: Danley Danker Weights   08/16/21 2229  Weight: 63.5 kg   General: NAD  Cardiovascular: S1, S2 present Respiratory: CTAB Abdomen: Soft, nontender, nondistended, bowel sounds present Musculoskeletal: No bilateral pedal edema noted Skin: Normal Psychiatry: Normal mood  Neurology: Noted peripheral vision loss on the right side, mild weakness on RUE/RLE  Condition at discharge: stable  The results of significant  diagnostics from this hospitalization (including imaging, microbiology, ancillary and laboratory) are listed below for reference.   Imaging Studies: CT ANGIO HEAD NECK W WO CM  Result Date: 08/16/2021 CLINICAL DATA:  Dizziness, loss of balance, and lethargy EXAM: CT ANGIOGRAPHY HEAD AND NECK TECHNIQUE: Multidetector CT imaging of the head and neck was performed using the standard protocol during bolus administration of intravenous contrast. Multiplanar CT image reconstructions and MIPs were obtained to evaluate the vascular anatomy. Carotid stenosis measurements (when applicable) are obtained utilizing NASCET criteria, using the distal internal carotid diameter as the denominator. RADIATION DOSE REDUCTION: This exam was  performed according to the departmental dose-optimization program which includes automated exposure control, adjustment of the mA and/or kV according to patient size and/or use of iterative reconstruction technique. CONTRAST:  34m OMNIPAQUE IOHEXOL 350 MG/ML SOLN COMPARISON:  None. FINDINGS: CT HEAD FINDINGS Brain: Intermediate density at the left caudate body and upper thalamus. No hemorrhage, hydrocephalus, or masslike finding. Vascular: See below Skull: No acute or aggressive finding. Periapical erosion around minimally covered right upper teeth. Sinuses: Negative Orbits: Negative Review of the MIP images confirms the above findings CTA NECK FINDINGS Aortic arch: Unremarkable where covered.  Three vessel branching. Right carotid system: Calcified plaque at the posterior wall of the right ICA. No significant stenosis and no ulceration. Left carotid system: Mixed density plaque the bifurcation and proximal ICA. Proximal ICA stenosis measures 35%. No ulceration or beading. Vertebral arteries: No proximal subclavian stenosis or irregularity. Codominant vertebral arteries which are smoothly contoured and widely patent to the dura. Skeleton: Ordinary cervical spine degeneration. Other neck: No acute or aggressive finding. Subcentimeter right thyroid nodule. No followup recommended (ref: J Am Coll Radiol. 2015 Feb;12(2): 143-50). Upper chest: Negative Review of the MIP images confirms the above findings CTA HEAD FINDINGS Anterior circulation: No significant stenosis, proximal occlusion, aneurysm, or vascular malformation. Posterior circulation: No significant stenosis, proximal occlusion, aneurysm, or vascular malformation. Venous sinuses: As permitted by contrast timing, patent. Anatomic variants: None Review of the MIP images confirms the above findings IMPRESSION: 1. 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 2. No emergent vascular finding. 3. Cervical carotid atherosclerosis with up to 35% ICA  narrowing on the left. Electronically Signed   By: JJorje GuildM.D.   On: 08/16/2021 05:29   MR BRAIN WO CONTRAST  Result Date: 08/16/2021 CLINICAL DATA:  Acute neurologic deficit EXAM: MRI HEAD WITHOUT CONTRAST TECHNIQUE: Multiplanar, multiecho pulse sequences of the brain and surrounding structures were obtained without intravenous contrast. COMPARISON:  None. FINDINGS: Brain: There is an acute infarct of the left caudate tail. No other diffusion abnormality. No acute or chronic hemorrhage. There is multifocal hyperintense T2-weighted signal within the white matter. Parenchymal volume and CSF spaces are normal. The midline structures are normal. Vascular: Major flow voids are preserved. Skull and upper cervical spine: Normal calvarium and skull base. Visualized upper cervical spine and soft tissues are normal. Sinuses/Orbits:No paranasal sinus fluid levels or advanced mucosal thickening. No mastoid or middle ear effusion. Normal orbits. IMPRESSION: Acute infarct of the left caudate tail. No hemorrhage or mass effect. Electronically Signed   By: KUlyses JarredM.D.   On: 08/16/2021 19:15   ECHOCARDIOGRAM COMPLETE  Result Date: 08/17/2021    ECHOCARDIOGRAM REPORT   Patient Name:   LLehman PromDate of Exam: 08/17/2021 Medical Rec #:  [id number]    Height:       66.0 in Accession #:    [id number]   Weight:  140.0 lb Date of Birth:  1960-10-18     BSA:          1.719 m Patient Age:    60 years      BP:           104/57 mmHg Patient Gender: F             HR:           106 bpm. Exam Location:  Inpatient Procedure: 2D Echo, Cardiac Doppler and Color Doppler Indications:    Stroke I63.9  History:        Patient has prior history of Echocardiogram examinations, most                 recent 07/29/2020. Risk Factors:Diabetes and Hypertension.  Sonographer:    Bernadene Person RDCS Referring Phys: [id number] [place]  1. Left ventricular ejection fraction, by estimation, is 60 to 65%. The left  ventricle has normal function. The left ventricle has no regional wall motion abnormalities. Left ventricular diastolic parameters are consistent with Grade I diastolic dysfunction (impaired relaxation).  2. Right ventricular systolic function is normal. The right ventricular size is normal. There is normal pulmonary artery systolic pressure. The estimated right ventricular systolic pressure is 96.7 mmHg.  3. The mitral valve is normal in structure. No evidence of mitral valve regurgitation. No evidence of mitral stenosis.  4. The aortic valve is normal in structure. Aortic valve regurgitation is not visualized. No aortic stenosis is present.  5. The inferior vena cava is normal in size with greater than 50% respiratory variability, suggesting right atrial pressure of 3 mmHg. Comparison(s): No significant change from prior study. Prior images reviewed side by side. FINDINGS  Left Ventricle: Left ventricular ejection fraction, by estimation, is 60 to 65%. The left ventricle has normal function. The left ventricle has no regional wall motion abnormalities. The left ventricular internal cavity size was normal in size. There is  no left ventricular hypertrophy. Left ventricular diastolic parameters are consistent with Grade I diastolic dysfunction (impaired relaxation). Right Ventricle: The right ventricular size is normal. No increase in right ventricular wall thickness. Right ventricular systolic function is normal. There is normal pulmonary artery systolic pressure. The tricuspid regurgitant velocity is 2.33 m/s, and  with an assumed right atrial pressure of 3 mmHg, the estimated right ventricular systolic pressure is 89.3 mmHg. Left Atrium: Left atrial size was normal in size. Right Atrium: Right atrial size was normal in size. Pericardium: There is no evidence of pericardial effusion. Mitral Valve: The mitral valve is normal in structure. No evidence of mitral valve regurgitation. No evidence of mitral valve  stenosis. Tricuspid Valve: The tricuspid valve is normal in structure. Tricuspid valve regurgitation is mild . No evidence of tricuspid stenosis. Aortic Valve: The aortic valve is normal in structure. Aortic valve regurgitation is not visualized. No aortic stenosis is present. Pulmonic Valve: The pulmonic valve was normal in structure. Pulmonic valve regurgitation is not visualized. No evidence of pulmonic stenosis. Aorta: The aortic root is normal in size and structure. Venous: The inferior vena cava is normal in size with greater than 50% respiratory variability, suggesting right atrial pressure of 3 mmHg. IAS/Shunts: No atrial level shunt detected by color flow Doppler.  LEFT VENTRICLE PLAX 2D LVIDd:         3.40 cm     Diastology LVIDs:         2.20 cm     LV e' medial:  6.20 cm/s LV PW:         0.90 cm     LV E/e' medial:  13.8 LV IVS:        0.70 cm     LV e' lateral:   5.96 cm/s LVOT diam:     1.90 cm     LV E/e' lateral: 14.4 LV SV:         43 LV SV Index:   25 LVOT Area:     2.84 cm  LV Volumes (MOD) LV vol d, MOD A2C: 46.6 ml LV vol d, MOD A4C: 62.3 ml LV vol s, MOD A2C: 18.4 ml LV vol s, MOD A4C: 27.7 ml LV SV MOD A2C:     28.2 ml LV SV MOD A4C:     62.3 ml LV SV MOD BP:      30.7 ml RIGHT VENTRICLE RV S prime:     12.70 cm/s TAPSE (M-mode): 2.0 cm LEFT ATRIUM             Index        RIGHT ATRIUM          Index LA diam:        2.60 cm 1.51 cm/m   RA Area:     8.96 cm LA Vol (A2C):   28.5 ml 16.58 ml/m  RA Volume:   16.80 ml 9.78 ml/m LA Vol (A4C):   30.7 ml 17.86 ml/m LA Biplane Vol: 32.3 ml 18.80 ml/m  AORTIC VALVE LVOT Vmax:   96.80 cm/s LVOT Vmean:  64.000 cm/s LVOT VTI:    0.152 m  AORTA Ao Root diam: 2.80 cm Ao Asc diam:  3.10 cm MITRAL VALVE                TRICUSPID VALVE MV Area (PHT): 5.66 cm     TR Peak grad:   21.7 mmHg MV Decel Time: 134 msec     TR Vmax:        233.00 cm/s MV E velocity: 85.70 cm/s MV A velocity: 103.00 cm/s  SHUNTS MV E/A ratio:  0.83         Systemic VTI:  0.15 m                              Systemic Diam: 1.90 cm Dani Gobble Croitoru MD Electronically signed by Sanda Klein MD Signature Date/Time: 08/17/2021/11:30:18 AM    Final     Microbiology: Results for orders placed or performed during the hospital encounter of 08/16/21  Resp Panel by RT-PCR (Flu A&B, Covid) Nasopharyngeal Swab     Status: None   Collection Time: 08/16/21  2:09 AM   Specimen: Nasopharyngeal Swab; Nasopharyngeal(NP) swabs in vial transport medium  Result Value Ref Range Status   SARS Coronavirus 2 by RT PCR NEGATIVE NEGATIVE Final    Comment: (NOTE) SARS-CoV-2 target nucleic acids are NOT DETECTED.  The SARS-CoV-2 RNA is generally detectable in upper respiratory specimens during the acute phase of infection. The lowest concentration of SARS-CoV-2 viral copies this assay can detect is 138 copies/mL. A negative result does not preclude SARS-Cov-2 infection and should not be used as the sole basis for treatment or other patient management decisions. A negative result may occur with  improper specimen collection/handling, submission of specimen other than nasopharyngeal swab, presence of viral mutation(s) within the areas targeted by this assay, and inadequate number of viral copies(<138 copies/mL). A negative result must be  combined with clinical observations, patient history, and epidemiological information. The expected result is Negative.  Fact Sheet for Patients:  EntrepreneurPulse.com.au  Fact Sheet for Healthcare Providers:  IncredibleEmployment.be  This test is no t yet approved or cleared by the [place] FDA and  has been authorized for detection and/or diagnosis of SARS-CoV-2 by FDA under an Emergency Use Authorization (EUA). This EUA will remain  in effect (meaning this test can be used) for the duration of the COVID-19 declaration under Section 564(b)(1) of the Act, 21 U.S.C.section 360bbb-3(b)(1), unless the authorization is  terminated  or revoked sooner.       Influenza A by PCR NEGATIVE NEGATIVE Final   Influenza B by PCR NEGATIVE NEGATIVE Final    Comment: (NOTE) The Xpert Xpress SARS-CoV-2/FLU/RSV plus assay is intended as an aid in the diagnosis of influenza from Nasopharyngeal swab specimens and should not be used as a sole basis for treatment. Nasal washings and aspirates are unacceptable for Xpert Xpress SARS-CoV-2/FLU/RSV testing.  Fact Sheet for Patients: EntrepreneurPulse.com.au  Fact Sheet for Healthcare Providers: IncredibleEmployment.be  This test is not yet approved or cleared by the [place] FDA and has been authorized for detection and/or diagnosis of SARS-CoV-2 by FDA under an Emergency Use Authorization (EUA). This EUA will remain in effect (meaning this test can be used) for the duration of the COVID-19 declaration under Section 564(b)(1) of the Act, 21 U.S.C. section 360bbb-3(b)(1), unless the authorization is terminated or revoked.  Performed at [facility], [place] [address] [place], [place] [address]     Labs: CBC: Recent Labs  Lab 08/16/21 0218 08/17/21 0106 08/18/21 0227  WBC 6.7 11.1* 5.2  NEUTROABS 4.4  --  2.2  HGB 14.3 11.4* 11.3*  HCT 41.9 34.0* 33.6*  MCV 91.7 92.4 92.3  PLT 262 225 604   Basic Metabolic Panel: Recent Labs  Lab 08/16/21 0218 08/17/21 0106 08/18/21 0227  NA 139 133* 137  K 3.6 3.9 3.6  CL 103 103 105  CO2 26 24 25   GLUCOSE 105* 283* 293*  BUN 12 18 12   CREATININE 0.62 0.68 0.49  CALCIUM 9.4 8.6* 8.4*   Liver Function Tests: Recent Labs  Lab 08/16/21 0218  AST 38  ALT 29  ALKPHOS 104  BILITOT 0.9  PROT 6.6  ALBUMIN 3.4*   CBG: Recent Labs  Lab 08/19/21 1149 08/19/21 1558 08/19/21 2200 08/20/21 0643 08/20/21 1119  GLUCAP 188* 176* 189* 131* 114*    Discharge time spent: greater than 30 minutes.  Signed: Alma Friendly, MD [facility] 08/20/2021

## 2021-08-21 DIAGNOSIS — I639 Cerebral infarction, unspecified: Secondary | ICD-10-CM | POA: Diagnosis not present

## 2021-08-21 LAB — GLUCOSE, CAPILLARY
Glucose-Capillary: 128 mg/dL — ABNORMAL HIGH (ref 70–99)
Glucose-Capillary: 175 mg/dL — ABNORMAL HIGH (ref 70–99)
Glucose-Capillary: 228 mg/dL — ABNORMAL HIGH (ref 70–99)
Glucose-Capillary: 218 mg/dL — ABNORMAL HIGH (ref 70–99)

## 2021-08-21 LAB — CBC WITH DIFFERENTIAL/PLATELET
Abs Immature Granulocytes: 0.01 10*3/uL (ref 0.00–0.07)
Basophils Absolute: 0 10*3/uL (ref 0.0–0.1)
Basophils Relative: 0 %
Eosinophils Absolute: 0.3 10*3/uL (ref 0.0–0.5)
Eosinophils Relative: 5 %
HCT: 37.6 % (ref 36.0–46.0)
Hemoglobin: 12.4 g/dL (ref 12.0–15.0)
Immature Granulocytes: 0 %
Lymphocytes Relative: 44 %
Lymphs Abs: 2.5 10*3/uL (ref 0.7–4.0)
MCH: 30.8 pg (ref 26.0–34.0)
MCHC: 33 g/dL (ref 30.0–36.0)
MCV: 93.5 fL (ref 80.0–100.0)
Monocytes Absolute: 0.5 10*3/uL (ref 0.1–1.0)
Monocytes Relative: 8 %
Neutro Abs: 2.5 10*3/uL (ref 1.7–7.7)
Neutrophils Relative %: 43 %
Platelets: 195 10*3/uL (ref 150–400)
RBC: 4.02 MIL/uL (ref 3.87–5.11)
RDW: 12.2 % (ref 11.5–15.5)
WBC: 5.7 10*3/uL (ref 4.0–10.5)
nRBC: 0 % (ref 0.0–0.2)

## 2021-08-21 LAB — COMPREHENSIVE METABOLIC PANEL
ALT: 36 U/L (ref 0–44)
AST: 49 U/L — ABNORMAL HIGH (ref 15–41)
Albumin: 2.8 g/dL — ABNORMAL LOW (ref 3.5–5.0)
Alkaline Phosphatase: 73 U/L (ref 38–126)
Anion gap: 8 (ref 5–15)
BUN: 10 mg/dL (ref 8–23)
CO2: 27 mmol/L (ref 22–32)
Calcium: 8.9 mg/dL (ref 8.9–10.3)
Chloride: 105 mmol/L (ref 98–111)
Creatinine, Ser: 0.5 mg/dL (ref 0.44–1.00)
GFR, Estimated: >60 mL/min (ref >60)
Glucose, Bld: 139 mg/dL — ABNORMAL HIGH (ref 70–99)
Potassium: 3.5 mmol/L (ref 3.5–5.1)
Sodium: 140 mmol/L (ref 135–145)
Total Bilirubin: 0.4 mg/dL (ref 0.3–1.2)
Total Protein: 5.6 g/dL — ABNORMAL LOW (ref 6.5–8.1)

## 2021-08-21 NOTE — Progress Notes (Signed)
PROGRESS NOTE   Subjective/Complaints:  Visual blurring started ~1 wk PTA Chronic orthostatic hypotension   ROS neg CP, SOB, N/V/D  Objective:   No results found. Recent Labs    08/21/21 0518  WBC 5.7  HGB 12.4  HCT 37.6  PLT 195   Recent Labs    08/21/21 0518  NA 140  K 3.5  CL 105  CO2 27  GLUCOSE 139*  BUN 10  CREATININE 0.50  CALCIUM 8.9    Intake/Output Summary (Last 24 hours) at 08/21/2021 0931 Last data filed at 08/21/2021 7614 Gross per 24 hour  Intake 340 ml  Output 117 ml  Net 223 ml        Physical Exam: Vital Signs Blood pressure 110/70, pulse 91, temperature 98 F (36.7 C), temperature source Oral, resp. rate 17, height 5\' 6"  (1.676 m), weight 66.6 kg, SpO2 95 %.  HEENT- lens opacity OS, OD, EOMI, no nystagmus, +mydriasis General: No acute distress Mood and affect are appropriate Heart: Regular rate and rhythm no rubs murmurs or extra sounds Lungs: Clear to auscultation, breathing unlabored, no rales or wheezes Abdomen: Positive bowel sounds, soft nontender to palpation, nondistended Extremities: No clubbing, cyanosis, or edema Skin: No evidence of breakdown, no evidence of rash Neurologic: Cranial nerves II through XII intact, motor strength is 5/5 in bilateral deltoid, bicep, tricep, grip, hip flexor, knee extensors, ankle dorsiflexor and plantar flexor  Musculoskeletal: Full range of motion in all 4 extremities. No joint swelling   Assessment/Plan: 1. Functional deficits which require 3+ hours per day of interdisciplinary therapy in a comprehensive inpatient rehab setting. Physiatrist is providing close team supervision and 24 hour management of active medical problems listed below. Physiatrist and rehab team continue to assess barriers to discharge/monitor patient progress toward functional and medical goals  Care Tool:  Bathing              Bathing assist       Upper  Body Dressing/Undressing Upper body dressing   What is the patient wearing?: Hospital gown only    Upper body assist Assist Level: Independent    Lower Body Dressing/Undressing Lower body dressing      What is the patient wearing?: Hospital gown only     Lower body assist Assist for lower body dressing: Independent     Toileting Toileting    Toileting assist Assist for toileting: Contact Guard/Touching assist     Transfers Chair/bed transfer  Transfers assist     Chair/bed transfer assist level: Supervision/Verbal cueing     Locomotion Ambulation   Ambulation assist              Walk 10 feet activity   Assist           Walk 50 feet activity   Assist           Walk 150 feet activity   Assist           Walk 10 feet on uneven surface  activity   Assist           Wheelchair     Assist  Wheelchair 50 feet with 2 turns activity    Assist            Wheelchair 150 feet activity     Assist          Blood pressure 110/70, pulse 91, temperature 98 F (36.7 C), temperature source Oral, resp. rate 17, height 5\' 6"  (1.676 m), weight 66.6 kg, SpO2 95 %.  Medical Problem List and Plan: 1. 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  Antithrombotics: -DVT/anticoagulation:  Pharmaceutical: Lovenox             -antiplatelet therapy: DAPT X 3 weeks followed by ASA alone on  09/08/21 3. Chronic pain/myalgias/Pain Management: Continue cymbalta 60 mg bid w/ gabapentin 600/600/1200             --will continue to hold Tizanidine.  4. Mood: LCSW to follow for evaluation and support.              -antipsychotic agents: N/A 5. Neuropsych: This patient is capable of making decisions on her own behalf. 6. Skin/Wound Care: Routine pressure relief measures.  7.  Fluids/Electrolytes/Nutrition: Monitor I/O. Check lytes in am.  8. RLL PNA: Treated with ceftriaxone-->Omnicef 3 additional days to complete 5 day course antibiotic regimen             --h/o RLL PNA 11/22  9. Chronic Adrenal insufficiency: now back on hydrocortisone 10 mg daily 10. T2DM with neuropathy/autonomic dysfunction: Hgb A1C-13.3 and poorly controlled but improved from >15.5 at last admission. Continue to educate on compliance.               --continue Insulin glargine 55 units with novolog 6 units TID for meal coverage.              -discussed outpatient Qutenza and she is interested in trying.  --Monitor BS ac/hs. Continue SSI for elevated BS. Continue to titrate insulin for tighter BS control.   CBG (last 3)  Recent Labs    08/20/21 1632 08/20/21 2106 08/21/21 0619  GLUCAP 132* 210* 128*  Some lability will monitor   11. 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             --will order orthostatic vitals.THigh hi teds monitor fluid intake              --continue Midodrine 10 mg TID for BP support.  12. Elevated lipids: Continue lipitor.  13. Chapped lips: ordered Blistex. 14. Periodontal disease: discussed her future plan for tooth removal and dentures. Discussed swishing coconut oil in mouth when she returns home for its antibacterial benefits.   15.  Visual blurring, has some lens opacity likely cataracts , given DM may also have retinopathy , will need optho visit as OP ( has not seen per pt report )   LOS: 1 days A FACE TO FACE EVALUATION WAS PERFORMED  [name] [name] 08/21/2021, 9:31 AM

## 2021-08-21 NOTE — Plan of Care (Signed)
°  RD consulted for nutrition education regarding diabetes.   Lab Results  Component Value Date   HGBA1C 13.3 (H) 08/17/2021    RD provided "Heart Healthy Consistent Carbohydrate" handout from the [employer]. Discussed different food groups and their effects on blood sugar, emphasizing carbohydrate-containing foods. Provided list of carbohydrates and recommended serving sizes of common foods. Recommended 4 carbohydrate servings at meals. Encouraged reading the nutrition food label to determine servings sizes of carbohydrate containing foods.   Discussed importance of controlled and consistent carbohydrate intake throughout the day. Provided examples of ways to balance meals/snacks and encouraged intake of high-fiber, whole grain complex carbohydrates. Discussed diabetic friendly drink options. Teach back method used.  Expect good compliance.  Current diet order is heart healthy/carbohydrate modified, patient is consuming approximately 100% of meals at this time. Labs and medications reviewed. No further nutrition interventions warranted at this time. RD contact information provided. If additional nutrition issues arise, please re-consult RD.  [name] Parker, MS, RD, LDN RD pager number/after hours weekend pager number on Am[employer].

## 2021-08-21 NOTE — Progress Notes (Addendum)
Upon trying to do orthostatic vitals, when patient stood, noticed that she had a bowel movement. Patient was unaware. As she was standing for the BP check, she flopped back onto the bed. She stated that she was dizzy & was not encouraged to stand back up. She did have an orthostaic episode. BP dropped to 61/36. At this time, patient stated that she felt like she needed to go to the bathroom. With her presentation, she was not safe to ambulate & there was not equipment in the room to get her there safely. A bedpan was obtained & patient was positioned on it. She stated that she did not know when she was going. She was given a little time to finish, while the oncoming nurse was given report & informed about the incident. The patient was taken off the bedpan & had produced a little more, plus urinated. She was given hygiene care, her bladder scanned & she was set up for breakfast. She then told the oncoming nurse & this nurse that she was having some small vision changes that she had before, but they did not last as long. As we watched her, she stated that she was alright & proceeded to eat her breakfast. No acute distress was noted. Patient left in care of her nurse.

## 2021-08-21 NOTE — Plan of Care (Signed)
°  Problem: RH Balance Goal: LTG Patient will maintain dynamic sitting balance (PT) Description: LTG:  Patient will maintain dynamic sitting balance with assistance during mobility activities (PT) Flowsheets (Taken 08/21/2021 1254) LTG: Pt will maintain dynamic sitting balance during mobility activities with:: Independent with assistive device  Goal: LTG Patient will maintain dynamic standing balance (PT) Description: LTG:  Patient will maintain dynamic standing balance with assistance during mobility activities (PT) Flowsheets (Taken 08/21/2021 1254) LTG: Pt will maintain dynamic standing balance during mobility activities with:: Independent with assistive device    Problem: Sit to Stand Goal: LTG:  Patient will perform sit to stand with assistance level (PT) Description: LTG:  Patient will perform sit to stand with assistance level (PT) Flowsheets (Taken 08/21/2021 1254) LTG: PT will perform sit to stand in preparation for functional mobility with assistance level: Independent with assistive device   Problem: RH Bed Mobility Goal: LTG Patient will perform bed mobility with assist (PT) Description: LTG: Patient will perform bed mobility with assistance, with/without cues (PT). Flowsheets (Taken 08/21/2021 1254) LTG: Pt will perform bed mobility with assistance level of: Independent with assistive device    Problem: RH Bed to Chair Transfers Goal: LTG Patient will perform bed/chair transfers w/assist (PT) Description: LTG: Patient will perform bed to chair transfers with assistance (PT). Flowsheets (Taken 08/21/2021 1254) LTG: Pt will perform Bed to Chair Transfers with assistance level: Independent with assistive device    Problem: RH Car Transfers Goal: LTG Patient will perform car transfers with assist (PT) Description: LTG: Patient will perform car transfers with assistance (PT). Flowsheets (Taken 08/21/2021 1254) LTG: Pt will perform car transfers with assist:: Supervision/Verbal cueing    Problem: RH Ambulation Goal: LTG Patient will ambulate in controlled environment (PT) Description: LTG: Patient will ambulate in a controlled environment, # of feet with assistance (PT). Flowsheets (Taken 08/21/2021 1254) LTG: Pt will ambulate in controlled environ  assist needed:: Independent with assistive device LTG: Ambulation distance in controlled environment: 169ft using LRAD Goal: LTG Patient will ambulate in home environment (PT) Description: LTG: Patient will ambulate in home environment, # of feet with assistance (PT). Flowsheets (Taken 08/21/2021 1254) LTG: Pt will ambulate in home environ  assist needed:: Independent with assistive device LTG: Ambulation distance in home environment: 79ft using LRAD   Problem: RH Stairs Goal: LTG Patient will ambulate up and down stairs w/assist (PT) Description: LTG: Patient will ambulate up and down # of stairs with assistance (PT) Flowsheets (Taken 08/21/2021 1254) LTG: Pt will ambulate up/down stairs assist needed:: Supervision/Verbal cueing LTG: Pt will  ambulate up and down number of stairs: 1 step using LRAD

## 2021-08-21 NOTE — Evaluation (Addendum)
Physical Therapy Assessment and Plan  Patient Details  Name: [name] [name] MRN: [id number] Date of Birth: 01/08/1961  PT Diagnosis: Abnormality of gait, Difficulty walking, Hemiparesis dominant, Impaired sensation, Muscle weakness, and Pain in low back Rehab Potential: Good ELOS: ~7-10 days   Today's Date: 08/21/2021 PT Individual Time: 1107-1203 PT Individual Time Calculation (min): 25 min    Hospital Problem: Principal Problem:   Stroke (cerebrum) [facility])   Past Medical History:  Past Medical History:  Diagnosis Date   Cervical disc disorder with radiculopathy of cervical region    Chronic pain syndrome    Degenerative joint disease    Depression dx 1997   Diabetes mellitus, type 2 ([facility]) 2011   No insulin   Diabetic polyneuropathy ([place])    GERD (gastroesophageal reflux disease)    Glaucoma    Headache(784.0)    Hypertension    Lab 11/2011:  CXR, EKG, CBC, TSH, BMet, troponin-normal; lipid profile: 188, 131, 36, 126    Lumbar herniated disc    Non-compliance    Pancreatic cyst    Endoscopic aspiration in 09/2009   Pneumonia 08/02/2012   Shingles    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 year old female with histoyr of T2DM w/neuropathy, chronic pain, adrenal insufficiency, autonomic dysfunction with orthostasis who was admitted on 08/16/21 with four day history of blurred vision and dizziness. She ws found to have RLL PNA and CTA head/neck was negative for LVO. UDS positive for THC. She was started on ceftriaxone/Zithromax for treatment and was treated  with stress dose steroids due to non-compliance. 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 2D echo showed EF 60-65% with no wall abnormality. Dr. Leonie Man felt that stroke was due to small vessel disease and recommended DAPT X 3 weeks followed by ASA alone. R-HH suspected, blurry vision is improving but patient continues to be limited by decreased sensation on right side with decrease in right motor control with slow shuffling gait as well as cognitive deficits. She complains of neuropathy. Patient transferred to CIR on 08/20/2021 .   Patient currently requires min assist with mobility secondary to muscle weakness, decreased cardiorespiratoy endurance, unbalanced muscle activation, decreased visual acuity, and decreased standing balance, decreased postural control, and decreased balance strategies.  Prior to hospitalization, patient was modified independent  using quad-cane with mobility and lived with Friend(s) (friend, Renne Musca) in a [place] home.  Home access is  Stairs to enter, Other (comment) (1 step up onto a curb and then 1 small step-up onto porch prior to entering apartment).  Patient will benefit from skilled PT intervention to maximize safe functional mobility, minimize fall risk, and decrease caregiver burden for planned discharge home with intermittent assist.  Anticipate patient will benefit from follow up OP at discharge.  PT - End of Session Activity Tolerance: Tolerates 30+ min activity with multiple rests Endurance Deficit: Yes Endurance Deficit Description: requires frequent seated rest breaks and pt reporting she feels she has poor  stamina PT Assessment Rehab Potential (ACUTE/IP ONLY): Good PT Barriers to Discharge: Decreased caregiver support;Lack of/limited family support PT Patient demonstrates impairments in the following area(s): Balance;Perception;Behavior;Safety;Edema;Sensory;Endurance;Skin  Integrity;Nutrition;Motor;Pain PT Transfers Functional Problem(s): Bed Mobility;Bed to Chair;Car;Furniture;Floor PT Locomotion Functional Problem(s): Ambulation;Stairs PT Plan PT Intensity: Minimum of 1-2 x/day ,45 to 90 minutes PT Frequency: 5 out of 7 days PT Duration Estimated Length of Stay: ~7-10 days PT Treatment/Interventions: Ambulation/gait training;Community reintegration;DME/adaptive equipment instruction;Neuromuscular re-education;Psychosocial support;Stair training;UE/LE Strength taining/ROM;Balance/vestibular training;Discharge planning;Functional electrical stimulation;Pain management;Skin care/wound management;Therapeutic Activities;UE/LE Coordination activities;Cognitive remediation/compensation;Disease management/prevention;Patient/family education;Functional mobility training;Splinting/orthotics;Therapeutic Exercise;Visual/perceptual remediation/compensation PT Transfers Anticipated Outcome(s): mod-I using LRAD PT Locomotion Anticipated Outcome(s): mod-I using LRAD PT Recommendation Follow Up Recommendations: Outpatient PT;Other (comment) (PRN support from family) Patient Destination: Home Equipment Recommended: To be determined   PT Evaluation Precautions/Restrictions Precautions Precautions: Fall;Other (comment) Precaution Comments: R visual field cut, autonomic dysfunction orthostasis Restrictions Weight Bearing Restrictions: No Pain Pain Assessment Pain Scale: 0-10 Pain Score: 3  Pain Type: Chronic pain Pain Location: Back Pain Orientation: Lower Pain Descriptors / Indicators: Aching Pain Onset: On-going Pain Intervention(s): Rest;Distraction;Repositioned;Ambulation/increased activity (Pt states "it's something I'm used to") Pain Interference Pain Interference Pain Effect on Sleep: 1. Rarely or not at all Pain Interference with Therapy Activities: 1. Rarely or not at all Pain Interference with Day-to-Day Activities: 1. Rarely or not at all Home  Living/Prior [place] Available Help at Discharge: Friend(s);Available PRN/intermittently Type of Home: Apartment Home Access: Stairs to enter;Other (comment) (1 step up onto a curb and then 1 small step-up onto porch prior to entering apartment) Entrance Stairs-Number of Steps: 1 Entrance Stairs-Rails: None Home Layout: One level Additional Comments: pt states her friend cannot assist her  Lives With: Friend(s) (friend, Renne Musca) Prior Function Level of Independence: Independent with gait;Independent with transfers;Requires assistive device for independence (intermittent use of quad-cane)  Able to Take Stairs?: Yes Driving: Yes Vocation: On disability Vision/Perception  Vision - History Ability to See in Adequate Light: 1 Impaired (requires use of reading glasses and head rotations with increased time to focus her vision to read large print) Perception Perception: Within Functional Limits Praxis Praxis: Intact  Cognition Overall Cognitive Status: Within Functional Limits for tasks assessed Arousal/Alertness: Awake/alert Orientation Level: Oriented X4 Year: 2023 Month: February Day of Week: Correct Attention: Sustained;Focused Focused Attention: Appears intact Sustained Attention: Appears intact Selective Attention: Appears intact Memory: Appears intact Problem Solving: Appears intact Safety/Judgment: Appears intact Sensation Sensation Light Touch: Impaired Detail Peripheral sensation comments: hx of peripheral neuropathy in B UEs and B LEs - able to feel touch on screen but reports abnormal sensation of "tingling" with touch Light Touch Impaired Details: Impaired RLE;Impaired LLE Hot/Cold: Not tested Proprioception: Appears Intact Stereognosis: Not tested Coordination Gross Motor Movements are Fluid and Coordinated: No Coordination and Movement Description: generalized weakness and deconditioning with slow movements and mild R hemiparesis Motor   Motor Motor: Other (comment) Motor - Skilled Clinical Observations: mild R hemiparesis with generalized weakness and deconditioning   Trunk/Postural Assessment  Cervical Assessment Cervical Assessment: Within Functional Limits Thoracic Assessment Thoracic Assessment: Exceptions to [facility] (mildly rounded shoulders) Lumbar Assessment Lumbar Assessment: Within Functional Limits Postural Control Postural Control: Deficits on evaluation Postural Limitations: decreased with pt maintaining guarded posture due to feeling unstable  Balance Balance Balance Assessed: Yes Static Sitting Balance Static Sitting - Balance Support: Feet supported Static Sitting - Level of Assistance: 5: Stand by assistance Dynamic Sitting Balance Dynamic Sitting - Balance Support: Feet supported Dynamic Sitting - Level of Assistance: Other (  comment) (CGA) Static Standing Balance Static Standing - Balance Support: During functional activity;Right upper extremity supported Static Standing - Level of Assistance: 4: Min assist Dynamic Standing Balance Dynamic Standing - Balance Support: During functional activity;Bilateral upper extremity supported Dynamic Standing - Level of Assistance: 4: Min assist Extremity Assessment      RLE Assessment RLE Assessment: Exceptions to [facility] Active Range of Motion (AROM) Comments: WFL slight tightness noted moving into ankle DF RLE Strength Right Hip Flexion: 3/5 Right Knee Flexion: 3/5 Right Knee Extension: 3+/5 Right Ankle Dorsiflexion: 3/5 Right Ankle Plantar Flexion: 3/5 LLE Assessment LLE Assessment: Exceptions to [facility] Active Range of Motion (AROM) Comments: WFL LLE Strength Left Hip Flexion: 3+/5 Left Knee Flexion: 4-/5 Left Knee Extension: 4/5 Left Ankle Dorsiflexion: 3+/5 Left Ankle Plantar Flexion: 3+/5  Care Tool Care Tool Bed Mobility Roll left and right activity   Roll left and right assist level: Supervision/Verbal cueing    Sit to lying activity   Sit  to lying assist level: Supervision/Verbal cueing    Lying to sitting on side of bed activity   Lying to sitting on side of bed assist level: the ability to move from lying on the back to sitting on the side of the bed with no back support.: Supervision/Verbal cueing     Care Tool Transfers Sit to stand transfer   Sit to stand assist level: Minimal Assistance - Patient > 75% Sit to stand assistive device: Other;Cane (narrow based quad cane)  Chair/bed transfer   Chair/bed transfer assist level: Minimal Assistance - Patient > 75% Chair/bed transfer assistive device: Cane;Other (narrow based quad cane)   Product manager transfer assist level: Minimal Assistance - Patient > 75% Health visitor Comment: narrow based quad cane    Care Tool Locomotion Ambulation   Assist level: Minimal Assistance - Patient > 75% Assistive device: Cane-quad Max distance: 186f  Walk 10 feet activity   Assist level: Minimal Assistance - Patient > 75% Assistive device: Cane-quad   Walk 50 feet with 2 turns activity   Assist level: Minimal Assistance - Patient > 75% Assistive device: Cane-quad  Walk 150 feet activity Walk 150 feet activity did not occur: Safety/medical concerns      Walk 10 feet on uneven surfaces activity   Assist level: Minimal Assistance - Patient > 75% Assistive device: Cane-quad;Other (comment) (railing)  Stairs   Assist level: Minimal Assistance - Patient > 75% Stairs assistive device: 2 hand rails Max number of stairs: 12  Walk up/down 1 step activity   Walk up/down 1 step (curb) assist level: Minimal Assistance - Patient > 75% Walk up/down 1 step or curb assistive device: 2 hand rails  Walk up/down 4 steps activity   Walk up/down 4 steps assist level: Minimal Assistance - Patient > 75% Walk up/down 4 steps assistive device: 2 hand rails  Walk up/down 12 steps activity   Walk up/down 12 steps assist level: Minimal Assistance -  Patient > 75% Walk up/down 12 steps assistive device: 2 hand rails  Pick up small objects from floor   Pick up small object from the floor assist level: Moderate Assistance - Patient 50 - 74%    Wheelchair Is the patient using a wheelchair?: Yes (only used for energy conservation & time management - no LTGs for w/c use) Type of Wheelchair: Manual   Wheelchair assist level: Dependent - Patient 0%    Wheel 50 feet with 2  turns activity   Assist Level: Dependent - Patient 0%  Wheel 150 feet activity   Assist Level: Dependent - Patient 0%    Refer to Care Plan for Long Term Goals  SHORT TERM GOAL WEEK 1 PT Short Term Goal 1 (Week 1): = to LTGs based on ELOS  Recommendations for other services: None   Skilled Therapeutic Intervention Pt received sitting in recliner and agreeable to therapy session. Evaluation completed (see details above) with patient education regarding purpose of PT evaluation, PT POC and goals, therapy schedule, weekly team meetings, and other CIR information including safety plan and fall risk safety. Pt participated in the following functional mobility tasks with the specified levels of skilled assistance and cuing. Pt overall demonstrates very slow speed of movement. Monitored vitals at beginning of session with pt stating she is able to monitor her ability to participate based on symptoms and therefore did not reassess during session. At end of session, pt left seated in recliner with needs in reach, seat belt alarm on, and nurse tech present.  Vitals:  Sitting at beginning: BP 140/87 (MAP 103), HR 101bpm  Standing: BP 95/63 (MAP 74), HR 95bpm  Sitting after walking ~42f to w/c: BP 134/85 (MAP 99), HR 90bpm   Mobility Bed Mobility Bed Mobility: Supine to Sit;Sit to Supine Supine to Sit: Supervision/Verbal cueing Sit to Supine: Supervision/Verbal cueing Transfers Transfers: Sit to Stand;Stand to Sit;Stand Pivot Transfers Sit to Stand: Minimal Assistance -  Patient > 75% Stand to Sit: Minimal Assistance - Patient > 75% Stand Pivot Transfers: Minimal Assistance - Patient > 75% Stand Pivot Transfer Details: Verbal cues for sequencing;Verbal cues for precautions/safety;Verbal cues for safe use of DME/AE;Verbal cues for gait pattern;Verbal cues for technique;Visual cues for safe use of DME/AE;Tactile cues for sequencing;Tactile cues for posture;Tactile cues for weight shifting Transfer (Assistive device): Small based quad cane Locomotion  Gait Ambulation: Yes Gait Assistance: Minimal Assistance - Patient > 75% Gait Distance (Feet): 120 Feet Assistive device: Small based quad cane (in R UE) Gait Assistance Details: Verbal cues for sequencing;Visual cues/gestures for sequencing;Verbal cues for technique;Verbal cues for gait pattern;Manual facilitation for weight shifting;Verbal cues for safe use of DME/AE;Verbal cues for precautions/safety;Tactile cues for posture;Tactile cues for sequencing;Tactile cues for weight shifting;Tactile cues for initiation Gait Gait: Yes Gait Pattern: Impaired Gait Pattern: Step-through pattern;Poor foot clearance - left;Poor foot clearance - right;Decreased step length - left;Decreased step length - right;Decreased stride length;Narrow base of support (slight reciprocal pattern, very slow speed of movements, narrow BOS, with decreased  BLE foot clearance during swing though adequate, guarded trunk posture) Gait velocity: significantly decreased Stairs / Additional Locomotion Stairs: Yes Stairs Assistance: Minimal Assistance - Patient > 75% Stair Management Technique: Two rails;Alternating pattern Number of Stairs: 12 Height of Stairs: 6 Ramp: Minimal Assistance - Patient >75% (pt elected to use railing as opposed to quad-cane) WProduct managerMobility: No   Discharge Criteria: Patient will be discharged from PT if patient refuses treatment 3 consecutive times without medical reason, if treatment goals  not met, if there is a change in medical status, if patient makes no progress towards goals or if patient is discharged from hospital.  The above assessment, treatment plan, treatment alternatives and goals were discussed and mutually agreed upon: by patient  CTawana Scale, PT, DPT, NCS, CSRS  08/21/2021, 7:54 AM

## 2021-08-21 NOTE — Progress Notes (Signed)
Patient ID: [name] [name], female   DOB: 08-12-1960, 61 y.o.   MRN: [id number] Met with the patient to introduce self, role, rehab process, team conference and plan of care. Discussed current situation, secondary risks and medication management. Reviewed HTN, HLD (LDL 106), DM (A1C 13.1) and dietary modification recommendations, carbohydrate counting and addition of protein sources for metabolism. Continue to follow along to discharge to address educational needs and faciilitate preparation for discharge. Margarito Liner

## 2021-08-21 NOTE — Progress Notes (Signed)
[facility] Individual Statement of Services  Patient Name:  [name] [name]  Date:  08/21/2021  Welcome to the [facility].  Our goal is to provide you with an individualized program based on your diagnosis and situation, designed to meet your specific needs.  With this comprehensive rehabilitation program, you will be expected to participate in at least 3 hours of rehabilitation therapies Monday-Friday, with modified therapy programming on the weekends.  Your rehabilitation program will include the following services:  Physical Therapy (PT), Occupational Therapy (OT), Speech Therapy (ST), 24 hour per day rehabilitation nursing, Therapeutic Recreaction (TR), Neuropsychology, Care Coordinator, Rehabilitation Medicine, Nutrition Services, Pharmacy Services, and Other  Weekly team conferences will be held on Wednesdays to discuss your progress.  Your Inpatient Rehabilitation Care Coordinator will talk with you frequently to get your input and to update you on team discussions.  Team conferences with you and your family in attendance may also be held.  Expected length of stay: 7-10 Days  Overall anticipated outcome:  MOD I to Intermittent supervision  Depending on your progress and recovery, your program may change. Your Inpatient Rehabilitation Care Coordinator will coordinate services and will keep you informed of any changes. Your Inpatient Rehabilitation Care Coordinator's name and contact numbers are listed  below.  The following services may also be recommended but are not provided by the [facility]:   [facility] will be made to provide these services after discharge if needed.  Arrangements include referral to agencies that provide these services.  Your insurance has been verified to be:  Medicare  Your primary doctor is:  Charlott Rakes, MD  Pertinent  information will be shared with your doctor and your insurance company.  Inpatient Rehabilitation Care Coordinator:  Erlene Quan, [place] or [phone]  Information discussed with and copy given to patient by: Dyanne Iha, 08/21/2021, 2:58 PM

## 2021-08-21 NOTE — Evaluation (Signed)
Occupational Therapy Assessment and Plan  Patient Details  Name: [name] [name] MRN: [id number] Date of Birth: Jun 01, 1961  OT Diagnosis: hemiplegia affecting dominant side and muscle weakness (generalized) Rehab Potential: Rehab Potential (ACUTE ONLY): Excellent ELOS: 7-10 days   Today's Date: 08/21/2021 OT Individual Time: 4332-9518 OT Individual Time Calculation (min): 70 min     Hospital Problem: Principal Problem:   Stroke (cerebrum) ([place])   Past Medical History:  Past Medical History:  Diagnosis Date   Cervical disc disorder with radiculopathy of cervical region    Chronic pain syndrome    Degenerative joint disease    Depression dx 1997   Diabetes mellitus, type 2 ([place]) 2011   No insulin   Diabetic polyneuropathy ([facility])    GERD (gastroesophageal reflux disease)    Glaucoma    Headache(784.0)    Hypertension    Lab 11/2011:  CXR, EKG, CBC, TSH, BMet, troponin-normal; lipid profile: 188, 131, 36, 126    Lumbar herniated disc    Non-compliance    Pancreatic cyst    Endoscopic aspiration in 09/2009   Pneumonia 08/02/2012   Shingles    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 year old female with recent admission to the hospital on 08/16/21 with four day history of blurred vision and dizziness. She ws found to have RLL PNA and CTA head/neck was negative for LVO. UDS positive for THC. She was started on ceftriaxone/Zithromax for treatment and was treated with stress dose steroids due to non-compliance. MRI brain done revealing acute infarct in left caudate  body and no other diffusion abnormality. 2D echo showed EF 60-65% with no wall abnormality Patient transferred to CIR on 08/20/2021 .    Patient currently requires min with basic self-care skills secondary to muscle weakness, impaired timing and sequencing, decreased visual acuity, and decreased standing balance and decreased balance strategies.  Prior to hospitalization, patient could complete ADLs with independent .  Patient will benefit from skilled intervention to decrease level of assist with basic self-care skills and increase independence with basic self-care skills prior to discharge home with care partner.  Anticipate patient will require intermittent supervision and follow up home health.  OT - End of Session Activity Tolerance: Improving Endurance Deficit: Yes OT Assessment Rehab Potential (ACUTE ONLY): Excellent OT Barriers to Discharge: Decreased caregiver support OT Barriers to Discharge Comments: roommate cannot provide physical assist OT Patient demonstrates impairments in the following area(s): Balance;Sensory;Endurance;Motor;Vision OT Basic ADL's Functional Problem(s): Eating;Grooming;Bathing;Dressing;Toileting OT Advanced ADL's Functional Problem(s): Simple Meal Preparation;Light Housekeeping OT Transfers Functional Problem(s): Toilet;Tub/Shower OT Additional Impairment(s): None OT Plan OT Intensity: Minimum of 1-2 x/day, 45 to 90 minutes OT Frequency: 5 out of 7 days OT Duration/Estimated Length of Stay: 7-10 days OT Treatment/Interventions: Teacher, English as a foreign language;Discharge planning;Pain management;Self Care/advanced ADL retraining;Therapeutic Activities;UE/LE Coordination activities;Patient/family education;Therapeutic Exercise;Cognitive remediation/compensation;Disease mangement/prevention;Functional mobility training;Visual/perceptual remediation/compensation;Community reintegration;Neuromuscular re-education;UE/LE Strength  taining/ROM OT Self Feeding Anticipated Outcome(s): independent OT Basic Self-Care Anticipated Outcome(s): modified independent OT Toileting Anticipated Outcome(s): modified independent OT Bathroom Transfers Anticipated  Outcome(s): modified independent OT Recommendation Patient destination: Home Follow Up Recommendations: Home health OT Equipment Recommended: To be determined   OT Evaluation Precautions/Restrictions  Precautions Precautions: Fall;Other (comment) Precaution Comments: R visual field cut, autonomic dysfunction orthostasis Restrictions Weight Bearing Restrictions: No Pain  No report of pain during session.  Home Living/Prior Functioning Home Living Family/patient expects to be discharged to:: Private residence Living Arrangements: Alone Available Help at Discharge: Friend(s), Available PRN/intermittently Type of Home: Apartment Home Access: Stairs to enter, Other (comment) Entrance Stairs-Number of Steps: 1 Entrance Stairs-Rails: None Home Layout: One level Bathroom Shower/Tub: Government social research officer Accessibility: Yes Additional Comments: pt states her friend cannot assist her  Lives With: Friend(s) IADL History Homemaking Responsibilities: Yes Meal Prep Responsibility: Primary Laundry Responsibility: Primary Cleaning Responsibility: Primary Current License: Yes Occupation: On disability Prior Function Level of Independence: Independent with gait, Independent with transfers, Requires assistive device for independence  Able to Take Stairs?: Yes Driving: Yes Vocation: On disability Vision Baseline Vision/History: 1 Wears glasses (for reading) Ability to See in Adequate Light: 1 Impaired Patient Visual Report: Blurring of vision Vision Assessment?: Yes Eye Alignment: Within Functional Limits Ocular Range of Motion: Within Functional Limits Alignment/Gaze Preference: Within Defined Limits Tracking/Visual Pursuits: Able to  track stimulus in all quads without difficulty Convergence: Within functional limits Visual Fields:  (to be further assessed) Additional Comments: No noted visual field deficit with gross confrontational testing, but will continue to evaluate further in function.  She does report some increased blurriness however. Perception  Perception: Within Functional Limits Praxis Praxis: Intact Cognition Overall Cognitive Status: Within Functional Limits for tasks assessed Arousal/Alertness: Awake/alert Orientation Level: Person;Place;Situation Person: Oriented Place: Oriented Situation: Oriented Year: 2023 Month: February Day of Week: Correct Memory: Appears intact Immediate Memory Recall: Sock;Blue;Bed Memory Recall Sock: Without Cue Memory Recall Blue: Without Cue Memory Recall Bed: Without Cue Attention: Sustained;Focused;Selective Focused Attention: Appears intact Sustained Attention: Appears intact Selective Attention: Appears intact Problem Solving: Appears intact Safety/Judgment: Appears intact Sensation Sensation Light Touch: Impaired Detail Light Touch Impaired Details: Impaired RUE Hot/Cold: Appears Intact Proprioception: Appears Intact Stereognosis: Not tested Additional Comments: Pt reports slight acuitiy impairment in the digits of the right hand compared to the left, but still able to detect Coordination Gross Motor Movements are Fluid and Coordinated: Yes Fine Motor Movements are Fluid and Coordinated: Yes Coordination and Movement Description: BUE coordination [place] for ADL tasks.  Will continue to investigate further in treatment. Motor  Motor Motor - Skilled Clinical Observations: Generalized weakness and deconditioning  Trunk/Postural Assessment  Cervical Assessment Cervical Assessment: Within Functional Limits Thoracic Assessment Thoracic Assessment: Exceptions to WFL (slight rounding noted but minimal) Lumbar Assessment Lumbar Assessment: Within Functional  Limits  Balance Balance Balance Assessed: Yes Static Sitting Balance Static Sitting - Balance Support: Feet supported Static Sitting - Level of Assistance: 7: Independent Dynamic Sitting Balance Dynamic Sitting - Balance Support: During functional activity Dynamic Sitting - Level of Assistance: 5: Stand by assistance Static Standing Balance Static Standing - Balance Support: During functional activity;Right upper extremity supported Static Standing - Level of Assistance: 4: Min assist Dynamic Standing Balance Dynamic Standing - Balance Support: During functional activity Dynamic Standing - Level of Assistance: 4: Min assist Extremity/Trunk Assessment RUE Assessment RUE Assessment: Within Functional Limits Active Range of Motion (AROM) Comments: Shoulder flexion 0-130 degrees, all other joints AROM WFLS General Strength Comments: strength 4/5 throughout LUE Assessment LUE Assessment: Within Functional Limits Active Range of Motion (AROM) Comments: Shoulder flexion 0-130 degrees, all  other joints AROM WFLS General Strength Comments: Strength 4/5 throughout  Care Tool Care Tool Self Care Eating   Eating Assist Level: Set up assist    Oral Care    Oral Care Assist Level: Minimal Assistance - Patient > 75% (standing)    Bathing   Body parts bathed by patient: Right arm;Left arm;Chest;Abdomen;Front perineal area;Buttocks;Right upper leg;Left upper leg;Face;Left lower leg;Right lower leg     Assist Level: Minimal Assistance - Patient > 75%    Upper Body Dressing(including orthotics)   What is the patient wearing?: Pull over shirt;Bra   Assist Level: Set up assist    Lower Body Dressing (excluding footwear)   What is the patient wearing?: Pants;Incontinence brief Assist for lower body dressing: Minimal Assistance - Patient > 75%    Putting on/Taking off footwear   What is the patient wearing?: Non-skid slipper socks;Ted hose Assist for footwear: Moderate Assistance -  Patient 50 - 74%       Care Tool Toileting Toileting activity   Assist for toileting: Minimal Assistance - Patient > 75%     Care Tool Bed Mobility Roll left and right activity        Sit to lying activity   Sit to lying assist level: Supervision/Verbal cueing    Lying to sitting on side of bed activity   Lying to sitting on side of bed assist level: the ability to move from lying on the back to sitting on the side of the bed with no back support.: Supervision/Verbal cueing     Care Tool Transfers Sit to stand transfer   Sit to stand assist level: Minimal Assistance - Patient > 75%    Chair/bed transfer   Chair/bed transfer assist level: Minimal Assistance - Patient > 75% Chair/bed transfer assistive device: Barrister's clerk transfer   Assist Level: Minimal Assistance - Patient > 75%     Care Tool Cognition  Expression of Ideas and Wants Expression of Ideas and Wants: 4. Without difficulty (complex and basic) - expresses complex messages without difficulty and with speech that is clear and easy to understand  Understanding Verbal and Non-Verbal Content Understanding Verbal and Non-Verbal Content: 4. Understands (complex and basic) - clear comprehension without cues or repetitions   Memory/Recall Ability Memory/Recall Ability : Current season;That he or she is in a hospital/hospital unit   Refer to Care Plan for Long Term Goals  SHORT TERM GOAL WEEK 1 OT Short Term Goal 1 (Week 1): STGs equal LTGs set at modified independent overall.  Recommendations for other services: None    Skilled Therapeutic Intervention ADL ADL Eating: Set up Where Assessed-Eating: Bed level Grooming: Minimal assistance Where Assessed-Grooming: Standing at sink Upper Body Bathing: Setup Where Assessed-Upper Body Bathing: Edge of bed Lower Body Bathing: Minimal assistance Where Assessed-Lower Body Bathing: Edge of bed Upper Body Dressing: Setup Where Assessed-Upper Body Dressing: Edge of  bed Lower Body Dressing: Minimal assistance Where Assessed-Lower Body Dressing: Edge of bed Toileting: Minimal assistance Where Assessed-Toileting: Bedside Commode Toilet Transfer: Minimal assistance Toilet Transfer Method: Counselling psychologist: Grab bars Tub/Shower Transfer: Not assessed Social research officer, government: Not assessed Mobility  Bed Mobility Bed Mobility: Supine to Sit;Sit to Supine Supine to Sit: Supervision/Verbal cueing Sit to Supine: Supervision/Verbal cueing Transfers Sit to Stand: Minimal Assistance - Patient > 75% Stand to Sit: Minimal Assistance - Patient > 75%  Pt in bed with nursing present to start session.  BP taken in supine with HOB elevated 45 degrees at 106/68.  She then transferred to the EOB with supervision where her BP was taken again at 94/66.  No symptoms reported.  She completed sit to stand with min assist using the RW for support.  BP was at 70/44 with pt reporting slight dizziness.  Based on her BP, decided to complete B/D from the EOB vs the shower for safety.  She was able to complete all UB bathing and dressing with setup.  She then completed LB bathing and dressing at min assist level with use of the RW for support.  Therapist assisted with donning knee high TEDs to help with B,P along with medicine given at start of session by nursing.  BP taken in standing at 90/62.  Had her complete functional mobility over to the sink with min assist for completion of oral hygiene in standing.  One LOB noted posteriorly while standing at the sink.  Returned to the recliner to rest at conclusion of grooming task.  Finished session with pt sitting in the recliner and with the call button and phone in reach.  Safety alarm belt in place as well.  Discussed expectation for LOS around 7-10 days as well as modified independent level goals.  Pt is in agreement.     Discharge Criteria: Patient will be discharged from OT if patient refuses treatment 3 consecutive  times without medical reason, if treatment goals not met, if there is a change in medical status, if patient makes no progress towards goals or if patient is discharged from hospital.  The above assessment, treatment plan, treatment alternatives and goals were discussed and mutually agreed upon: by patient  Ervin Rothbauer OTR/L 08/21/2021, 4:50 PM

## 2021-08-21 NOTE — Evaluation (Signed)
Speech Language Pathology Assessment and Plan  Patient Details  Name: TENNILE [name] MRN: [id number] Date of Birth: [birth year]  Today's Date: 08/21/2021 SLP Individual Time: 1937-9024 SLP Individual Time Calculation (min): 45 min and Today's Date: 08/21/2021 SLP Missed Time: 15 Minutes Missed Time Reason: Other (Comment) (Therapist delayed due to administrative support)  Hospital Problem: Principal Problem:   Stroke (cerebrum) [facility])  Past Medical History:  Past Medical History:  Diagnosis Date   Cervical disc disorder with radiculopathy of cervical region    Chronic pain syndrome    Degenerative joint disease    Depression dx 1997   Diabetes mellitus, type 2 ([place]) 2011   No insulin   Diabetic polyneuropathy ([place])    GERD (gastroesophageal reflux disease)    Glaucoma    Headache(784.0)    Hypertension    Lab 11/2011:  CXR, EKG, CBC, TSH, BMet, troponin-normal; lipid profile: 188, 131, 36, 126    Lumbar herniated disc    Non-compliance    Pancreatic cyst    Endoscopic aspiration in 09/2009   Pneumonia 08/02/2012   Shingles    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   Torn meniscus    Vertigo    Past Surgical History:  Past Surgical History:  Procedure Laterality Date   ABDOMINAL HYSTERECTOMY     CESAREAN SECTION     ORIF ANKLE FRACTURE Right 02/25/2020   Procedure: OPEN REDUCTION INTERNAL FIXATION (ORIF) ANKLE FRACTURE;  Surgeon: Lovell Sheehan, MD;  Location: [facility] ORS;  Service: Orthopedics;  Laterality: Right;    Assessment / Plan / Recommendation Clinical Impression [name] [name] is a 61 year old female with history of T2DM w/neuropathy, chronic pain, adrenal insufficiency, autonomic dysfunction with orthostasis who was admitted on 08/16/21 with four day history of blurred vision and dizziness. She was found to have RLL PNA and CTA head/neck was negative for LVO. UDS positive for THC. She was started on ceftriaxone/Zithromax for treatment and was treated with stress  dose steroids due to non-compliance. 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 Dr. Leonie [name] felt that stroke was due to small vessel disease and recommended DAPT X 3 weeks followed by ASA alone. R-HH suspected, blurry vision is improving but patient continues to be limited by decreased sensation on right side with decrease in right motor control with slow shuffling gait as well as cognitive deficits. She complains of neuropathy.   Patient seen for speech/language/cognitive-linguistic evaluation and presenting [facility] for all tasks assessed. Pt was oriented x4 and exhibited functional attention, recall, problem solving, awareness, and anticipatory problem solving. SLP administered the [facility] Mental Status Examination (SLUMS) and patient scored 29/30 points with a score of 27 or above considered normal. Patient's overall auditory comprehension and verbal expression appeared [facility] for all tasks assessed and patient was 100% intelligible at the conversation level. Pt reports feeling she is at her baseline cognition and endorsed relative weaknesses in short-term memory and calculations (often uses calculator) at baseline, although this was not observed/impacted today's assessment. Skilled ST intervention does not appear warranted at this time. Educated pt of assessment results. Pt looking forward to focusing on PT/OT. ST to sign off at this time.   Skilled Therapeutic Interventions          Pt participated in [place] Status Examination (SLUMS) as well as further non-standardized assessments of cognitive-linguistic, speech, and language function. Please see above.  SLP Assessment  Patient does not need any further Speech [facility] Services    Recommendations  Patient destination: Home Follow up Recommendations: None Equipment Recommended: None recommended by SLP           Pain Pain Assessment Pain Scale:  Faces Faces Pain Scale: Hurts little more Pain Type: Acute pain Pain Location: Head Pain Descriptors / Indicators: Headache Pain Frequency: Intermittent Pain Onset: Gradual Pain Intervention(s): Medication (See eMAR)  Prior Functioning Cognitive/Linguistic Baseline: Within functional limits Type of Home: Apartment  Lives With: Friend(s) Available Help at Discharge: Friend(s);Available PRN/intermittently Vocation: On disability  SLP Evaluation Cognition Overall Cognitive Status: Within Functional Limits for tasks assessed Arousal/Alertness: Awake/alert Orientation Level: Oriented X4 Year: 2023 Month: February Day of Week: Correct Attention: Sustained;Focused Focused Attention: Appears intact Sustained Attention: Appears intact Selective Attention: Appears intact Memory: Appears intact Immediate Memory Recall: Sock;Blue;Bed Memory Recall Sock: Without Cue Memory Recall Blue: Without Cue Memory Recall Bed: Without Cue Problem Solving: Appears intact Safety/Judgment: Appears intact  Comprehension Auditory Comprehension Overall Auditory Comprehension: Appears within functional limits for tasks assessed Expression Expression Primary Mode of Expression: Verbal Verbal Expression Overall Verbal Expression: Appears within functional limits for tasks assessed Oral Motor Oral Motor/Sensory Function Overall Oral Motor/Sensory Function: Within functional limits Motor Speech Overall Motor Speech: Appears within functional limits for tasks assessed Intelligibility: Intelligible  Care Tool Care Tool Cognition Ability to hear (with hearing aid or hearing appliances if normally used Ability to hear (with hearing aid or hearing appliances if normally used): 0. Adequate - no difficulty in normal conservation, social interaction, listening to TV   Expression of Ideas and Wants Expression of Ideas and Wants: 4. Without difficulty (complex and basic) - expresses complex messages without  difficulty and with speech that is clear and easy to understand   Understanding Verbal and Non-Verbal Content Understanding Verbal and Non-Verbal Content: 4. Understands (complex and basic) - clear comprehension without cues or repetitions  Memory/Recall Ability Memory/Recall Ability : Current season;That he or she is in a hospital/hospital unit   Intelligibility: Intelligible  Recommendations for other services: None   Discharge Criteria: Patient will be discharged from SLP if patient refuses treatment 3 consecutive times without medical reason, if treatment goals not met, if there is a change in medical status, if patient makes no progress towards goals or if patient is discharged from hospital.  The above assessment, treatment plan, treatment alternatives and goals were discussed and mutually agreed upon: by patient  Patty Sermons 08/21/2021, 6:54 PM

## 2021-08-21 NOTE — Progress Notes (Signed)
Inpatient Rehabilitation  Patient information reviewed and entered into eRehab system by Meador Kugelman M. Amarise Lillo, M.A., CCC/SLP, PPS Coordinator.  Information including medical coding, functional ability and quality indicators will be reviewed and updated through discharge.    

## 2021-08-22 LAB — GLUCOSE, CAPILLARY
Glucose-Capillary: 87 mg/dL (ref 70–99)
Glucose-Capillary: 157 mg/dL — ABNORMAL HIGH (ref 70–99)
Glucose-Capillary: 130 mg/dL — ABNORMAL HIGH (ref 70–99)
Glucose-Capillary: 155 mg/dL — ABNORMAL HIGH (ref 70–99)

## 2021-08-22 MED ORDER — HYDROCORTISONE 20 MG PO TABS
20.0000 mg | ORAL_TABLET | Freq: Every day | ORAL | Status: DC
  Administered 2021-08-23 – 2021-08-27 (×5): 20 mg via ORAL
  Filled 2021-08-22 (×5): qty 1

## 2021-08-22 MED ORDER — INSULIN ASPART 100 UNIT/ML IJ SOLN
7.0000 [IU] | Freq: Three times a day (TID) | INTRAMUSCULAR | Status: DC
  Administered 2021-08-22 (×2): 7 [IU] via SUBCUTANEOUS

## 2021-08-22 MED ORDER — FLUDROCORTISONE ACETATE 0.1 MG PO TABS: 0.1000 mg | ORAL_TABLET | Freq: Every day | ORAL | Status: DC

## 2021-08-22 NOTE — Progress Notes (Signed)
Physical Therapy Session Note  Patient Details  Name: [name] [name] MRN: [id number] Date of Birth: 09-Jul-1961  Today's Date: 08/22/2021 PT Individual Time: 1635-1730 PT Individual Time Calculation (min): 55 min   Short Term Goals: Week 1:  PT Short Term Goal 1 (Week 1): = to LTGs based on ELOS  Skilled Therapeutic Interventions/Progress Updates:   Pt received sitting in WC and agreeable to PT. Ambulatory transfer with [facility] with supervision assist for safety.   Standing balance wii bowling from level surface x 8 frames with supervision assist-CGA for safety and balance with min cues to prevent posterior LOB. Standing on Airex pad to place and remove clothes pins with intermittent UE support on tray table. CGA to prevent R lateral lean and posterior bias. Pt noted to have signifcantly increased WB on the RLE vs LLE. Wii fit table tilt x 5 level with min assist for use of ankle strategy to improve L and anterior weight shift. PT performed heel cord stretch to BLE; pt reports hx of ankle surgery with rods and pins still in place on the RLE limiting ankle ROM. 2 x 30sec on the RLE, 2 x 1 min on the LLE.   Pt returned to room and performed ambulatory transfer to bed with QC and supervision  assist. Sit>supine completed without assist, and left supine in bed with call bell in reach and all needs met.         Therapy Documentation Precautions:  Precautions Precautions: Fall, Other (comment) Precaution Comments: R visual field cut, autonomic dysfunction orthostasis Restrictions Weight Bearing Restrictions: No   Pain: denies     Therapy/Group: Individual Therapy  Lorie Phenix 08/22/2021, 5:57 PM

## 2021-08-22 NOTE — Progress Notes (Signed)
Occupational Therapy Session Note  Patient Details  Name: [name] [name] MRN: [id number] Date of Birth: [birth year]  Today's Date: 08/22/2021 OT Individual Time: 0908-1001 OT Individual Time Calculation (min): 53 min    Short Term Goals: Week 1:  OT Short Term Goal 1 (Week 1): STGs equal LTGs set at modified independent overall.  Skilled Therapeutic Interventions/Progress Updates:    Pt received semi-reclined in bed, no c/o pain and denies any dizziness, agreeable to therapy. Session focus on self-care retraining, activity tolerance, transfer retraining in prep for improved ADL/IADL/func mobility performance + decreased caregiver burden.   BP seated up in bed read at 120/77 (89), siting EOB 95/63 (74), in stance 58/43 (5), but denies sx. Recovered to 106/62 (76) seated EOB and with knee high teds. Denies any sx and requesting to shower.  Ambulated to and from shower with quad cane and CGA. Completed full body bathing from seated level with close S due to hx of orthostatic hypotension. Maintained B teds on during shower.   Completed UB dressing with ind, LBD with CGA for sit to stand, and seated grooming tasks mod I. Redonned teds at BP at end of session.   Ambulated > recliner with no AD and CGA.   Discussed DME needs and f/u recs. Pt reports transport to OP would be challenging. Primarily concerned about R eye blurry vision post stroke.  Pt left seated in recliner with safety belt alarm engaged, call bell in reach, and all immediate needs met.    Therapy Documentation Precautions:  Precautions Precautions: Fall, Other (comment) Precaution Comments: R visual field cut, autonomic dysfunction orthostasis Restrictions Weight Bearing Restrictions: No  Pain: denies   ADL: See Care Tool for more details.   Therapy/Group: Individual Therapy  Volanda Napoleon MS, OTR/L  08/22/2021, 6:51 AM

## 2021-08-22 NOTE — Progress Notes (Signed)
Inpatient Rehabilitation Care Coordinator Assessment and Plan Patient Details  Name: [name] [name] MRN: [id number] Date of Birth: January 06, 1961  Today's Date: 08/22/2021  Hospital Problems: Principal Problem:   Stroke (cerebrum) [facility] ([place]  Past Medical History:  Past Medical History:  Diagnosis Date   Cervical disc disorder with radiculopathy of cervical region    Chronic pain syndrome    Degenerative joint disease    Depression dx 1997   Diabetes mellitus, type 2 ([place]) 2011   No insulin   Diabetic polyneuropathy (HCC)    GERD (gastroesophageal reflux disease)    Glaucoma    Headache(784.0)    Hypertension    Lab 11/2011:  CXR, EKG, CBC, TSH, BMet, troponin-normal; lipid profile: 188, 131, 36, 126    Lumbar herniated disc    Non-compliance    Pancreatic cyst    Endoscopic aspiration in 09/2009   Pneumonia 08/02/2012   Shingles    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 Her smoking use included cigarettes. She has never used smokeless tobacco. She reports that she does not currently use drugs after having used the following drugs: Marijuana. She reports that she does not drink alcohol.  Family / Support Systems Children: Caesar Chestnut (DaughteR), Konrad Dolores (Son) Anticipated Caregiver: Shandi Ability/Limitations of Caregiver: works Careers adviser: Intermittent Family Dynamics: support from daughter and roomate (no physical assistance)  Social History Preferred language: English Religion: Contractor Issues:  n/a Guardian/Conservator: Shandi   Abuse/Neglect Abuse/Neglect Assessment Can Be Completed: Yes Physical Abuse: Denies Verbal Abuse: Denies Sexual Abuse: Denies Exploitation of patient/patient's resources: Denies Self-Neglect: Denies  Patient response to: Social Isolation - How often do you feel lonely or isolated from those around you?: Never  Emotional Status Recent Psychosocial Issues: coping, hx of depression Psychiatric History: hx of depression Substance Abuse History: n/a  Patient / Family Perceptions, Expectations & Goals Pt/Family understanding of illness & functional limitations: yes Premorbid pt/family roles/activities: previously independent overall Anticipated changes in roles/activities/participation: daughter works able to provide intermittent assistance Pt/family expectations/goals: mod i to intermittent supervision  [place]: None Premorbid Home Care/DME Agencies: Other (Comment) ([employer], 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[employer], Civil engineer, contracting) Transportation available at discharge: daughter able to transport Is the patient able to respond to transportation needs?: Yes In the past 12 months, has lack of transportation kept you from medical appointments or from getting medications?: No In the past 12 months, has lack of transportation kept you from meetings, work, or from getting things needed for daily living?: No  Discharge Planning Living Arrangements: Alone Support Systems: Children, Water engineer (roomate) Type of Residence: Private residence Insurance underwriter Resources: Information systems manager, [employer] (specify county) Museum/gallery curator Screen Referred: No Living Expenses: Education officer, community Management: Patient Does the patient have any problems obtaining your medications?: No Home Management: daughter arranged pill box Patient/Family Preliminary Plans: daughter will cont to plan meds Care Coordinator Barriers to Discharge: Lack of/limited family support, Decreased  caregiver support  Clinical Impression Sw made attempt x2 to call pt daughter. Sw will follow up  with patient  Dyanne Iha 08/22/2021, 10:53 AM

## 2021-08-22 NOTE — Progress Notes (Signed)
Patient resting in bed, oriented and cooperative, joking and communicating with family member.and staff. Denies pain but continue to verbalize discomfort with blurred vision bilaterally, no headache,lFamily member at bedside and is willing to stay over during the night

## 2021-08-22 NOTE — Progress Notes (Signed)
PROGRESS NOTE   Subjective/Complaints:  Chronic orthostatic hypotension , c/o diarrhea which she typically gets with antibiotics , off Omnicef as of 2/2  ROS neg CP, SOB, N/V/D  Objective:   No results found. Recent Labs    08/21/21 0518  WBC 5.7  HGB 12.4  HCT 37.6  PLT 195    Recent Labs    08/21/21 0518  NA 140  K 3.5  CL 105  CO2 27  GLUCOSE 139*  BUN 10  CREATININE 0.50  CALCIUM 8.9     Intake/Output Summary (Last 24 hours) at 08/22/2021 0816 Last data filed at 08/22/2021 0806 Gross per 24 hour  Intake 620 ml  Output --  Net 620 ml         Physical Exam: Vital Signs Blood pressure (!) 90/56, pulse 95, temperature 98 F (36.7 C), resp. rate 14, height 5\' 6"  (1.676 m), weight 66.6 kg, SpO2 96 %.  HEENT- lens opacity OS, OD, EOMI, no nystagmus, +mydriasis General: No acute distress Mood and affect are appropriate Heart: Regular rate and rhythm no rubs murmurs or extra sounds Lungs: Clear to auscultation, breathing unlabored, no rales or wheezes Abdomen: Positive bowel sounds, soft nontender to palpation, nondistended Extremities: No clubbing, cyanosis, or edema Skin: No evidence of breakdown, no evidence of rash Neurologic: Cranial nerves II through XII intact, motor strength is 5/5 in bilateral deltoid, bicep, tricep, grip, hip flexor, knee extensors, ankle dorsiflexor and plantar flexor  Musculoskeletal: Full range of motion in all 4 extremities. No joint swelling   Assessment/Plan: 1. Functional deficits which require 3+ hours per day of interdisciplinary therapy in a comprehensive inpatient rehab setting. Physiatrist is providing close team supervision and 24 hour management of active medical problems listed below. Physiatrist and rehab team continue to assess barriers to discharge/monitor patient progress toward functional and medical goals  Care Tool:  Bathing    Body parts bathed by  patient: Right arm, Left arm, Chest, Abdomen, Front perineal area, Buttocks, Right upper leg, Left upper leg, Face, Left lower leg, Right lower leg         Bathing assist Assist Level: Minimal Assistance - Patient > 75%     Upper Body Dressing/Undressing Upper body dressing   What is the patient wearing?: Pull over shirt, Bra    Upper body assist Assist Level: Contact Guard/Touching assist    Lower Body Dressing/Undressing Lower body dressing      What is the patient wearing?: Underwear/pull up, Pants     Lower body assist Assist for lower body dressing: Minimal Assistance - Patient > 75%     Toileting Toileting    Toileting assist Assist for toileting: Minimal Assistance - Patient > 75%     Transfers Chair/bed transfer  Transfers assist     Chair/bed transfer assist level: Minimal Assistance - Patient > 75% Chair/bed transfer assistive device: Programmer, multimedia   Ambulation assist      Assist level: Minimal Assistance - Patient > 75% Assistive device: No Device Max distance: 5'   Walk 10 feet activity   Assist     Assist level: Minimal Assistance - Patient > 75% Assistive device: Cane-quad  Walk 50 feet activity   Assist    Assist level: Minimal Assistance - Patient > 75% Assistive device: Cane-quad    Walk 150 feet activity   Assist Walk 150 feet activity did not occur: Safety/medical concerns         Walk 10 feet on uneven surface  activity   Assist     Assist level: Minimal Assistance - Patient > 75% Assistive device:  (railing)   Wheelchair     Assist Is the patient using a wheelchair?: Yes (only used for energy conservation & time management - no LTGs for w/c use) Type of Wheelchair: Manual    Wheelchair assist level: Dependent - Patient 0%      Wheelchair 50 feet with 2 turns activity    Assist        Assist Level: Dependent - Patient 0%   Wheelchair 150 feet activity     Assist       Assist Level: Dependent - Patient 0%   Blood pressure (!) 90/56, pulse 95, temperature 98 F (36.7 C), resp. rate 14, height 5\' 6"  (1.676 m), weight 66.6 kg, SpO2 96 %.  Medical Problem List and Plan: 1. 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  Antithrombotics: -DVT/anticoagulation:  Pharmaceutical: Lovenox             -antiplatelet therapy: DAPT X 3 weeks followed by ASA alone on  09/08/21 3. Chronic pain/myalgias/Pain Management: Continue cymbalta 60 mg bid w/ gabapentin 600/600/1200             --will continue to hold Tizanidine.  4. Mood: LCSW to follow for evaluation and support.              -antipsychotic agents: N/A 5. Neuropsych: This patient is capable of making decisions on her own behalf. 6. Skin/Wound Care: Routine pressure relief measures.  7. Fluids/Electrolytes/Nutrition: Monitor I/O. Check lytes in am.  8. RLL PNA: Treated with ceftriaxone-->Omnicef 3 additional days to complete 5 day course antibiotic regimen             --h/o RLL PNA 11/22  9. Chronic Adrenal insufficiency: now back on hydrocortisone 10 mg daily, given ongoing orthostasis will increase to 20mg   10. T2DM with neuropathy/autonomic dysfunction: Hgb A1C-13.3 and poorly controlled but improved from >15.5 at last admission. Continue to educate on compliance.               --continue Insulin glargine 55 units with novolog 6 units TID for meal coverage.                --Monitor BS ac/hs. Continue SSI for elevated BS. Continue to titrate insulin for tighter BS control.   CBG (last 3)  Recent Labs    08/21/21 1649 08/21/21 2139 08/22/21 0633  GLUCAP 228* 218* 87   Some lability will monitor , am CBG ok mainly running high during the day increase meal coverage to 7 U, if am CBG remains below 100 in am would reduce Semglee , may go up with increased hydrocortisone   11.  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             --  will order orthostatic vitals.THigh hi teds monitor fluid intake              --continue Midodrine 10 mg TID for BP support.  Add florinef 0.1 mg on 08/22/21 12. Elevated lipids: Continue lipitor.  13. Chapped lips: ordered Blistex. 14. Periodontal disease: discussed her future plan for tooth removal and dentures. Discussed swishing coconut oil in mouth when she returns home for its antibacterial benefits.   15.  Visual blurring, has some lens opacity likely cataracts , given DM may also have retinopathy , will need optho visit as OP ( has not seen per pt report )  16.  Freq loose stools , no liquid stools no laxatives, no abd exam abnormalities, off Omnicef 2/2, monitor for now  LOS: 2 days A FACE TO Keota E Burnis Kaser 08/22/2021, 8:16 AM

## 2021-08-22 NOTE — Progress Notes (Addendum)
Physical Therapy Session Note  Patient Details  Name: [name] [name] MRN: [id number] Date of Birth: 1960-08-25  Today's Date: 08/22/2021 PT Individual Time: 1300-1330 PT Individual Time Calculation (min): 30 min   Short Term Goals: Week 1:  PT Short Term Goal 1 (Week 1): = to LTGs based on ELOS  Skilled Therapeutic Interventions/Progress Updates: Pt presents sitting on recliner and agreeable to therapy.  Pt transfers sit to stand w/ CGA and verbal cues for forward scoot.  Pt encouraged to stand at front of seat for autonomic dysfunction orthostasis.  Pt amb 120' to dayroom w/ SBQC and good cadence although pt frustrated w/ slowness of her gait now.  Encouragement needed to discuss progress to be made and return to home.  Pt amb x 2 of 50' including turns to return to seat.  Pt amb 150' back to room and remained sitting in recliner.  Chair alarm on and all needs in reach.     Therapy Documentation Precautions:  Precautions Precautions: Fall, Other (comment) Precaution Comments: R visual field cut, autonomic dysfunction orthostasis Restrictions Weight Bearing Restrictions: No General:   Vital Signs:  Pain:0/10       Therapy/Group: Individual Therapy  Ladoris Gene 08/22/2021, 3:17 PM

## 2021-08-23 LAB — GLUCOSE, CAPILLARY
Glucose-Capillary: 64 mg/dL — ABNORMAL LOW (ref 70–99)
Glucose-Capillary: 74 mg/dL (ref 70–99)
Glucose-Capillary: 85 mg/dL (ref 70–99)
Glucose-Capillary: 96 mg/dL (ref 70–99)
Glucose-Capillary: 260 mg/dL — ABNORMAL HIGH (ref 70–99)
Glucose-Capillary: 304 mg/dL — ABNORMAL HIGH (ref 70–99)

## 2021-08-23 MED ORDER — CALCIUM POLYCARBOPHIL 625 MG PO TABS
625.0000 mg | ORAL_TABLET | Freq: Every day | ORAL | Status: DC
  Administered 2021-08-23 – 2021-08-27 (×5): 625 mg via ORAL
  Filled 2021-08-23 (×5): qty 1

## 2021-08-23 MED ORDER — LIVING WELL WITH DIABETES BOOK
Freq: Once | Status: AC
  Filled 2021-08-23: qty 1

## 2021-08-23 MED ORDER — INSULIN ASPART 100 UNIT/ML IJ SOLN
6.0000 [IU] | Freq: Three times a day (TID) | INTRAMUSCULAR | Status: DC
  Administered 2021-08-23 – 2021-08-26 (×12): 6 [IU] via SUBCUTANEOUS

## 2021-08-23 NOTE — Progress Notes (Signed)
Occupational Therapy Session Note  Patient Details  Name: [name] [name] MRN: [id number] Date of Birth: 03/22/1961  Today's Date: 08/23/2021 OT Individual Time: 7865-4561 OT Individual Time Calculation (min): 9 min  and Today's Date: 08/23/2021 OT Missed Time: 50 Minutes Missed Time Reason: Patient fatigue   Short Term Goals: Week 1:  OT Short Term Goal 1 (Week 1): STGs equal LTGs set at modified independent overall.  Skilled Therapeutic Interventions/Progress Updates:    Session 1 [phone]): Pt received semi-reclined in bed starting breakfast. Reports feeling not well / cold post syncopal episode early this am. Donned B teds and gripper socks at bed level total A. BP semi-reclined in bed read at 124/78 (91). Pt politely declining ADL/OOB activity at this time, missed 50 min of scheduled therapy. Agreeable to attempt therapy later as schedule allows. Set-up A required to open milk carton for breakfast. Abdominal binder noted to have been delivered to room. Pt left semi-reclined in bed with bed alarm engaged, call bell in reach, and all immediate needs met.      Therapy Documentation Precautions:  Precautions Precautions: Fall, Other (comment) Precaution Comments: R visual field cut, autonomic dysfunction orthostasis Restrictions Weight Bearing Restrictions: No  Pain: no c/o   ADL: See Care Tool for more details.   Therapy/Group: Individual Therapy  Volanda Napoleon MS, OTR/L  08/23/2021, 6:52 AM

## 2021-08-23 NOTE — Progress Notes (Signed)
PROGRESS NOTE   Subjective/Complaints: Sleepy BP dropped last night and she nearly passed out- was nor wearing TEDs on transfer- ordered TEDs and abdominal binder.   ROS neg CP, SOB, N/V/D, +orthostatic hypotension  Objective:   No results found. Recent Labs    08/21/21 0518  WBC 5.7  HGB 12.4  HCT 37.6  PLT 195   Recent Labs    08/21/21 0518  NA 140  K 3.5  CL 105  CO2 27  GLUCOSE 139*  BUN 10  CREATININE 0.50  CALCIUM 8.9    Intake/Output Summary (Last 24 hours) at 08/23/2021 1237 Last data filed at 08/23/2021 0751 Gross per 24 hour  Intake 657 ml  Output --  Net 657 ml        Physical Exam: Vital Signs Blood pressure 111/69, pulse 93, temperature 97.9 F (36.6 C), temperature source Oral, resp. rate 18, height 5\' 6"  (1.676 m), weight 66.6 kg, SpO2 95 %.  HEENT- lens opacity OS, OD, EOMI, no nystagmus, +mydriasis, BMI 23.7 General: No acute distress Mood and affect are appropriate Heart: Regular rate and rhythm no rubs murmurs or extra sounds Lungs: Clear to auscultation, breathing unlabored, no rales or wheezes Abdomen: Positive bowel sounds, soft nontender to palpation, nondistended Extremities: No clubbing, cyanosis, or edema Skin: No evidence of breakdown, no evidence of rash Neurologic: Cranial nerves II through XII intact, motor strength is 5/5 in bilateral deltoid, bicep, tricep, grip, hip flexor, knee extensors, ankle dorsiflexor and plantar flexor  Musculoskeletal: Full range of motion in all 4 extremities. No joint swelling   Assessment/Plan: 1. Functional deficits which require 3+ hours per day of interdisciplinary therapy in a comprehensive inpatient rehab setting. Physiatrist is providing close team supervision and 24 hour management of active medical problems listed below. Physiatrist and rehab team continue to assess barriers to discharge/monitor patient progress toward functional and  medical goals  Care Tool:  Bathing    Body parts bathed by patient: Right arm, Left arm, Chest, Abdomen, Front perineal area, Buttocks, Right upper leg, Left upper leg, Face, Left lower leg, Right lower leg         Bathing assist Assist Level: Minimal Assistance - Patient > 75%     Upper Body Dressing/Undressing Upper body dressing   What is the patient wearing?: Pull over shirt    Upper body assist Assist Level: Minimal Assistance - Patient > 75%    Lower Body Dressing/Undressing Lower body dressing      What is the patient wearing?: Pants     Lower body assist Assist for lower body dressing: Minimal Assistance - Patient > 75%     Toileting Toileting    Toileting assist Assist for toileting: Minimal Assistance - Patient > 75%     Transfers Chair/bed transfer  Transfers assist     Chair/bed transfer assist level: Minimal Assistance - Patient > 75% Chair/bed transfer assistive device: Programmer, multimedia   Ambulation assist      Assist level: Minimal Assistance - Patient > 75% Assistive device: Cane-quad Max distance: 150'   Walk 10 feet activity   Assist     Assist level: Minimal Assistance - Patient >  75% Assistive device: Cane-quad   Walk 50 feet activity   Assist    Assist level: Minimal Assistance - Patient > 75% Assistive device: Cane-quad    Walk 150 feet activity   Assist Walk 150 feet activity did not occur: Safety/medical concerns  Assist level: Minimal Assistance - Patient > 75% Assistive device: Cane-quad    Walk 10 feet on uneven surface  activity   Assist     Assist level: Minimal Assistance - Patient > 75% Assistive device:  (railing)   Wheelchair     Assist Is the patient using a wheelchair?: No Type of Wheelchair: Manual    Wheelchair assist level: Dependent - Patient 0%      Wheelchair 50 feet with 2 turns activity    Assist        Assist Level: Dependent - Patient 0%    Wheelchair 150 feet activity     Assist      Assist Level: Dependent - Patient 0%   Blood pressure 111/69, pulse 93, temperature 97.9 F (36.6 C), temperature source Oral, resp. rate 18, height 5\' 6"  (1.676 m), weight 66.6 kg, SpO2 95 %.  Medical Problem List and Plan: 1. Functional deficits secondary to left caudate tail infarct.has a R HH             -patient may shower             -ELOS/Goals: 7-10 days S             Continue CIR 2.  Antithrombotics: -DVT/anticoagulation:  Pharmaceutical: Lovenox             -antiplatelet therapy: DAPT X 3 weeks followed by ASA alone on  09/08/21 3. Chronic pain/myalgias/Pain Management: Continue cymbalta 60 mg bid w/ gabapentin 600/600/1200             --will continue to hold Tizanidine.  4. Mood: LCSW to follow for evaluation and support.              -antipsychotic agents: N/A 5. Neuropsych: This patient is capable of making decisions on her own behalf. 6. Skin/Wound Care: Routine pressure relief measures.  7. Fluids/Electrolytes/Nutrition: Monitor I/O. Check lytes in am.  8. RLL PNA: Treated with ceftriaxone-->Omnicef 3 additional days to complete 5 day course antibiotic regimen             --h/o RLL PNA 11/22  9. Chronic Adrenal insufficiency: now back on hydrocortisone 10 mg daily, given ongoing orthostasis will increase to 20mg   10. T2DM with neuropathy/autonomic dysfunction: Hgb A1C-13.3 and poorly controlled but improved from >15.5 at last admission. Continue to educate on compliance.               --continue Insulin glargine 55 units with novolog 6 units TID for meal coverage.                --Monitor BS ac/hs. Continue SSI for elevated BS. Continue to titrate insulin for tighter BS control.   CBG (last 3)  Recent Labs    08/23/21 0509 08/23/21 0636 08/23/21 1107  GLUCAP 74 85 96  Some lability will monitor , am CBG ok mainly running high during the day increase meal coverage to 7 U, if am CBG remains below 100 in am would  reduce Semglee , may go up with increased hydrocortisone  2/4: decrease novolog to 6U given hypoglycemia 11. 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             --  will order orthostatic vitals.THigh hi teds monitor fluid intake              --continue Midodrine 10 mg TID for BP support.   -ordered abdominal binder Add florinef 0.1 mg on 08/22/21 12. Elevated lipids: Continue lipitor.  13. Chapped lips: ordered Blistex. 14. Periodontal disease: discussed her future plan for tooth removal and dentures. Discussed swishing coconut oil in mouth when she returns home for its antibacterial benefits.   15.  Visual blurring, has some lens opacity likely cataracts , given DM may also have retinopathy , will need optho visit as OP ( has not seen per pt report )  16.  Freq loose stools , no liquid stools no laxatives, no abd exam abnormalities, off Omnicef 2/2, monitor for now. Add fiber. LOS: 3 days A FACE TO FACE EVALUATION WAS PERFORMED  Izora Ribas 08/23/2021, 12:37 PM

## 2021-08-23 NOTE — Progress Notes (Signed)
Pt orthostatic this am with therapy. Pt had ABD binder in place, however refused thigh high teds and would only wear knee highs.  Will discuss further with pt as we had this discussion prior to therapy and why its important to continue medication and wear devices.

## 2021-08-23 NOTE — Progress Notes (Signed)
Physical Therapy Session Note  Patient Details  Name: [name] [name] MRN: [id number] Date of Birth: [birth year]  Today's Date: 08/23/2021 PT Individual Time: 1300-1358 PT Individual Time Calculation (min): 58 min   Short Term Goals: Week 1:  PT Short Term Goal 1 (Week 1): = to LTGs based on ELOS  Skilled Therapeutic Interventions/Progress Updates:   Pt resting in bed.  She reported that she "felt bad" but could not describe it except fatigue.  Pt reported that she has had uncontrollable diarrhea x 2 days. BP in supine with HOB at 60 degrees, 136/78, HR 103.  Pt wearing knee high TEDs and abdominal binder.  Therapeutic exercises performed with LEs to increase strength for functional mobility. In supine with HOB raised- 1 x 15 bil hip internal rotation, 1 x 30 alternating ankle pumps, 2 x 10 R/L straight leg raises, 2 x 10 bil bridging.   Abdominal binder noted to be over chest.  Pt bridged up and PT adjusted it lower.  Supine> sit in nearly flat bed, no rails, supervision.  No c/o dizziness .  After sitting up x 2 min BP 138/79, HR 105.   Sit> stand with supervision, to QC.  Gait training on level tile x 120' with turns to L and R, CGa.  Pt without c/o of dizziness, and stated that she "felt fine".    Standing dynamic balance activity: transporting small items in R hand from table to sink, requiring 2 steps laterally R then L for q item, without UE support, x 8 items.  No LOB.  At end of session, pt resting in bed with HOB raised, needs at hand and alarm set.     Therapy Documentation Precautions:  Precautions Precautions: Fall, Other (comment) Precaution Comments: R visual field cut, autonomic dysfunction orthostasis Restrictions Weight Bearing Restrictions: No       Therapy/Group: Individual Therapy  Judene Logue 08/23/2021, 4:39 PM

## 2021-08-23 NOTE — Significant Event (Signed)
Hypoglycemic Event  CBG: 64  Treatment: 4 oz juice/soda  Symptoms:  patient complained of feeling cold  Follow-up CBG: Time:0509 CBG Result:74  Possible Reasons for Event: Unknown  Comments/MD notified: treated per protocol. Made Dr. Adam Phenix aware.    [name] [name] A

## 2021-08-23 NOTE — Progress Notes (Signed)
Physical Therapy Session Note  Patient Details  Name: [name] [name] MRN: [id number] Date of Birth: 01-28-1961  Today's Date: 08/23/2021 PT Individual Time: 1006-1100 PT Individual Time Calculation (min): 54 min   Short Term Goals: Week 1:  PT Short Term Goal 1 (Week 1): = to LTGs based on ELOS   Skilled Therapeutic Interventions/Progress Updates:   Pt received supine in bed and agreeable to PT. Pt reports feeling very fatigued today, but reports having slept well overnight. Pt wearing knee high teds, and declining to don thigh high. PT performed orthostatic VS assessment.   Supine: 105/64.  Sitting 0 min 79/55 mild s/s Sitting 2 min 93/60. Improved s/s Standing no abdonimal binder 54/36. Severe s/s  Sitting 93/58. Mild s/s  PT assisted to don abdominal binder.  sitting 103/68.  Standing0 min   64/28 moderate s/s  Standing 1 min 71/37. Mild s/s. Sitting 107/71. Asymptomatic.   Supine>sit transfer with supervision assist and mild use of bed rail noted. Sit<>stand performed with supervision assist and RUE on QC for balance. Mild unsteadiness noted with initial standing BP.   Stand pivot tranfers to and from [place] x 3 throughout session with supervision assist BUE supported on arm rests.   Nustep reciprocal movement training x 5 min +4 min with therapeutic rest break between bouts. BP 107/71 following initial bout. Mild weakness noted in the LLE by pt, that she reports is new. Will continue to monitor  Patient returned to room and left sitting in [facility] with call bell in reach and all needs met.  RN aware of VS assessment.        Therapy Documentation Precautions:  Precautions Precautions: Fall, Other (comment) Precaution Comments: R visual field cut, autonomic dysfunction orthostasis Restrictions Weight Bearing Restrictions: No    Pain: Pain Assessment Pain Scale: 0-10 Pain Score: 0-No pain    Therapy/Group: Individual Therapy  Lorie Phenix 08/23/2021, 11:09 AM

## 2021-08-23 NOTE — Progress Notes (Signed)
At 0415-Patient assisted up to BR. While washing hands, patient with syncope episode. Total assist back to bed, foot of bed elevated. BP initially 78/48, rechecked-96/61. At 0442, BP-111/69. Reports feeling some better. At 0446-Spot check CBG=64. Treated With 4 ounces of regular Coke. At 0509, rechecked-74. Large incontinent, mushy BM. Paged Dr. Adam Phenix at [phone] related to above info. Reports will be up shortly to check on patient. [name] [name]

## 2021-08-23 NOTE — IPOC Note (Addendum)
Overall Plan of Care [facility]) Patient Details Name: [name] [name] MRN: [id number] DOB: 1961/03/12  Admitting Diagnosis: Stroke (cerebrum) [facility])  Hospital Problems: Principal Problem:   Stroke (cerebrum) [facility])     Functional Problem List: Nursing Medication Management, Safety, Pain, Endurance  PT Balance, Perception, Behavior, Safety, Edema, Sensory, Endurance, Skin Integrity, Nutrition, Motor, Pain  OT Balance, Sensory, Endurance, Motor, Vision  SLP    TR         Basic ADLs: OT Eating, Grooming, Bathing, Dressing, Toileting     Advanced  ADLs: OT Simple Meal Preparation, Light Housekeeping     Transfers: PT Bed Mobility, Bed to Chair, Car, Furniture, Floor  OT Toilet, Tub/Shower     Locomotion: PT Ambulation, Stairs     Additional Impairments: OT None  SLP        TR      Anticipated Outcomes Item Anticipated Outcome  Self Feeding independent  Swallowing      Basic self-care  modified independent  Toileting  modified independent   Bathroom Transfers modified independent  Bowel/Bladder  manage bowel w mod I  Transfers  mod-I using LRAD  Locomotion  mod-I using LRAD  Communication     Cognition     Pain  pain at or below level 4 with prns  Safety/Judgment  maintain safety w cues   Therapy Plan: PT Intensity: Minimum of 1-2 x/day ,45 to 90 minutes PT Frequency: 5 out of 7 days PT Duration Estimated Length of Stay: ~7-10 days OT Intensity: Minimum of 1-2 x/day, 45 to 90 minutes OT Frequency: 5 out of 7 days OT Duration/Estimated Length of Stay: 7-10 days     Due to the current state of emergency, patients may not be receiving their 3-hours of Medicare-mandated therapy.   Team Interventions: Nursing Interventions Disease Management/Prevention, Medication Management, Discharge Planning, Pain Management, Patient/Family Education  PT interventions Ambulation/gait training, Community reintegration, DME/adaptive equipment instruction, Neuromuscular  re-education, Psychosocial support, Stair training, UE/LE Strength taining/ROM, Training and development officer, Discharge planning, Functional electrical stimulation, Pain management, Skin care/wound management, Therapeutic Activities, UE/LE Coordination activities, Cognitive remediation/compensation, Disease management/prevention, Patient/family education, Functional mobility training, Splinting/orthotics, Therapeutic Exercise, Visual/perceptual remediation/compensation  OT Interventions Balance/vestibular training, DME/adaptive equipment instruction, Discharge planning, Pain management, Self Care/advanced ADL retraining, Therapeutic Activities, UE/LE Coordination activities, Patient/family education, Therapeutic Exercise, Cognitive remediation/compensation, Disease mangement/prevention, Functional mobility training, Visual/perceptual remediation/compensation, Community reintegration, Neuromuscular re-education, UE/LE Strength taining/ROM  SLP Interventions    TR Interventions    SW/CM Interventions Discharge Planning, Psychosocial Support, Patient/Family Education, Disease Management/Prevention   Barriers to Discharge MD  Medical stability  Nursing Decreased caregiver support, Medication compliance 1 level 1 ste w room mate, dtr can assist but works  PT Decreased caregiver support, Lack of/limited family support    OT Decreased caregiver support roommate cannot provide physical assist  SLP      SW Lack of/limited family support, Decreased caregiver support     Team Discharge Planning: Destination: PT-  Home,OT- Home , SLP-Home Projected Follow-up: PT-Outpatient PT, Other (comment) (PRN support from family), OT-  Home health OT, SLP-None Projected Equipment Needs: PT-To be determined, OT- To be determined, SLP-None recommended by SLP Equipment Details: PT- , OT-  Patient/family involved in discharge planning: PT- Patient,  OT-Patient, SLP-Patient  MD ELOS: 7-10 days Medical Rehab Prognosis:   Excellent Assessment: [name] [name] is a 61 year old woman admitted to CIR with functional deficits secondary to left caudate tail infarct.has a R HH. Medications are being managed, and labs and vitals are  being monitored regularly.     See Team Conference Notes for weekly updates to the plan of care

## 2021-08-23 NOTE — Plan of Care (Signed)
°  Problem: RH BOWEL ELIMINATION Goal: RH STG MANAGE BOWEL WITH ASSISTANCE Description: STG Manage Bowel with mod I  Assistance. Flowsheets (Taken 08/23/2021 0127) STG: Pt will manage bowels with assistance: 5-Supervision/curing Goal: RH STG MANAGE BOWEL W/MEDICATION W/ASSISTANCE Description: STG Manage Bowel with Medication with mod I Assistance. Flowsheets (Taken 08/23/2021 0130) STG: Pt will manage bowels with medication with assistance: 6-Modified independent   Problem: RH SAFETY Goal: RH STG ADHERE TO SAFETY PRECAUTIONS W/ASSISTANCE/DEVICE Description: STG Adhere to Safety Precautions With cues Assistance/Device. Flowsheets (Taken 08/23/2021 0130) STG:Pt will adhere to safety precautions with assistance/device: 6-Modified independent   Problem: RH PAIN MANAGEMENT Goal: RH STG PAIN MANAGED AT OR BELOW PT'S PAIN GOAL Description: At or below level 4 with prns Note: Pain goal < 2

## 2021-08-24 LAB — GLUCOSE, CAPILLARY
Glucose-Capillary: 127 mg/dL — ABNORMAL HIGH (ref 70–99)
Glucose-Capillary: 86 mg/dL (ref 70–99)
Glucose-Capillary: 101 mg/dL — ABNORMAL HIGH (ref 70–99)
Glucose-Capillary: 171 mg/dL — ABNORMAL HIGH (ref 70–99)
Glucose-Capillary: 174 mg/dL — ABNORMAL HIGH (ref 70–99)

## 2021-08-24 NOTE — Progress Notes (Addendum)
Physical Therapy Session Note  Patient Details  Name: [name] [name] MRN: [id number] Date of Birth: 1961/06/12  Today's Date: 08/24/2021 PT Individual Time: 1405-1505 PT Individual Time Calculation (min): 60 min   Short Term Goals: Week 1:  PT Short Term Goal 1 (Week 1): = to LTGs based on ELOS  Skilled Therapeutic Interventions/Progress Updates:  Pt resting in bed.  She denied pain.  BP in supine with HOB raised = 142/77, HR 93.    PT donned TEDS and non -slip socks.  Therapeutic exercises performed in supine with LE to increase strength for functional mobility. 15 x 1 bil hip internal rotation, 10 x 1 alternating ankle pumps, 10 x 1 bil bridging , 10 x 2 bil bridging with adductor squeezes.  Pt counted aloud to avoid Valsalva maneuver.  Seated - 15 x 1 bil scapular retraction to promote thoracic extension.  In flat bed, no rails, pt sat EOB independently.  She reported feeling "light headed" when asked.  This resolved in 1 minute.  Sit> stand with supervision.  Gait training on level tile, R quad cane, x 150' with CGa, except for 2 LOB to L when distracted.  1st LOB required assistance to prevent fall; pt drifted L and had no balance strategy. Advanced gait to return to room, R QC, carrying empty tote bag with L hand, CGA on level tile without LOB.  Balance retraining to elicit trunk shortening/lengthening/rotating in sitting without LE support, reaching R /L out of BOS to retrieve cones and then place near L/R  hip. No LOB when reaching L or R, but reaching out of BOS to R and L with difficulty.   Sit>< stand without QC, using Bobath method with hands clasped in front of her, x 5 without LOB.   Reactive balance retraining in standing: pt responds well to external perturbations (at hips)forward and to R, but has LOB with perturbations to L and backwards.  When balance disturbed backwards, pt had complete LOB backwards and sat on mat table behind her.  With repition, she had 3-4 automatic  steps > 2 automatic steps.  Pt reported that she has had numerous falls in last 5 years, 1 fall with crushing injury to R ankle requiring ORIF.  Ankle proprioception R/L accurate 5/5 movements.  Ambulatory transfer to return to bed.  Sit> supine independently in flat bed, no rails. Bed alarm set and needs left at hand.      Therapy Documentation Precautions:  Precautions Precautions: Fall, Other (comment) Precaution Comments: R visual field cut, autonomic dysfunction orthostasis Restrictions Weight Bearing Restrictions: No      Therapy/Group: Individual Therapy  Xaniyah Buchholz 08/24/2021, 4:26 PM

## 2021-08-25 LAB — BASIC METABOLIC PANEL
Anion gap: 8 (ref 5–15)
BUN: 12 mg/dL (ref 8–23)
CO2: 28 mmol/L (ref 22–32)
Calcium: 8.8 mg/dL — ABNORMAL LOW (ref 8.9–10.3)
Chloride: 105 mmol/L (ref 98–111)
Creatinine, Ser: 0.64 mg/dL (ref 0.44–1.00)
GFR, Estimated: >60 mL/min (ref >60)
Glucose, Bld: 121 mg/dL — ABNORMAL HIGH (ref 70–99)
Potassium: 3.7 mmol/L (ref 3.5–5.1)
Sodium: 141 mmol/L (ref 135–145)

## 2021-08-25 LAB — GLUCOSE, CAPILLARY
Glucose-Capillary: 315 mg/dL — ABNORMAL HIGH (ref 70–99)
Glucose-Capillary: 112 mg/dL — ABNORMAL HIGH (ref 70–99)
Glucose-Capillary: 142 mg/dL — ABNORMAL HIGH (ref 70–99)
Glucose-Capillary: 243 mg/dL — ABNORMAL HIGH (ref 70–99)
Glucose-Capillary: 266 mg/dL — ABNORMAL HIGH (ref 70–99)

## 2021-08-25 LAB — CBC
HCT: 36 % (ref 36.0–46.0)
Hemoglobin: 11.8 g/dL — ABNORMAL LOW (ref 12.0–15.0)
MCH: 31.1 pg (ref 26.0–34.0)
MCHC: 32.8 g/dL (ref 30.0–36.0)
MCV: 94.7 fL (ref 80.0–100.0)
Platelets: 204 10*3/uL (ref 150–400)
RBC: 3.8 MIL/uL — ABNORMAL LOW (ref 3.87–5.11)
RDW: 12.2 % (ref 11.5–15.5)
WBC: 5.3 10*3/uL (ref 4.0–10.5)
nRBC: 0 % (ref 0.0–0.2)

## 2021-08-25 NOTE — Progress Notes (Addendum)
PROGRESS NOTE   Subjective/Complaints:  No dizziness with standing, went up/down 12 steps with PT this am  Labs reviewed  ROS neg CP, SOB, N/V/D, +orthostatic hypotension  Objective:   No results found. Recent Labs    08/25/21 0531  WBC 5.3  HGB 11.8*  HCT 36.0  PLT 204    Recent Labs    08/25/21 0531  NA 141  K 3.7  CL 105  CO2 28  GLUCOSE 121*  BUN 12  CREATININE 0.64  CALCIUM 8.8*     Intake/Output Summary (Last 24 hours) at 08/25/2021 0939 Last data filed at 08/25/2021 0738 Gross per 24 hour  Intake 1340 ml  Output --  Net 1340 ml         Physical Exam: Vital Signs Blood pressure 122/60, pulse 95, temperature 97.9 F (36.6 C), temperature source Oral, resp. rate 16, height 5\' 6"  (1.676 m), weight 66.6 kg, SpO2 98 %.   General: No acute distress Mood and affect are appropriate Heart: Regular rate and rhythm no rubs murmurs or extra sounds Lungs: Clear to auscultation, breathing unlabored, no rales or wheezes Abdomen: Positive bowel sounds, soft nontender to palpation, nondistended Extremities: No clubbing, cyanosis, or edema Skin: No evidence of breakdown, no evidence of rash   Neurologic: Cranial nerves II through XII intact, motor strength is 5/5 in bilateral deltoid, bicep, tricep, grip, hip flexor, knee extensors, ankle dorsiflexor and plantar flexor  Musculoskeletal: Full range of motion in all 4 extremities. No joint swelling   Assessment/Plan: 1. Functional deficits which require 3+ hours per day of interdisciplinary therapy in a comprehensive inpatient rehab setting. Physiatrist is providing close team supervision and 24 hour management of active medical problems listed below. Physiatrist and rehab team continue to assess barriers to discharge/monitor patient progress toward functional and medical goals  Care Tool:  Bathing    Body parts bathed by patient: Right arm, Left arm,  Chest, Abdomen, Front perineal area, Buttocks, Right upper leg, Left upper leg, Face, Left lower leg, Right lower leg         Bathing assist Assist Level: Minimal Assistance - Patient > 75%     Upper Body Dressing/Undressing Upper body dressing   What is the patient wearing?: Pull over shirt    Upper body assist Assist Level: Minimal Assistance - Patient > 75%    Lower Body Dressing/Undressing Lower body dressing      What is the patient wearing?: Pants     Lower body assist Assist for lower body dressing: Minimal Assistance - Patient > 75%     Toileting Toileting    Toileting assist Assist for toileting: Contact Guard/Touching assist     Transfers Chair/bed transfer  Transfers assist     Chair/bed transfer assist level: Supervision/Verbal cueing Chair/bed transfer assistive device: Research officer, political party   Ambulation assist      Assist level: Contact Guard/Touching assist Assistive device: Cane-quad Max distance: 150   Walk 10 feet activity   Assist     Assist level: Contact Guard/Touching assist Assistive device: Cane-quad   Walk 50 feet activity   Assist    Assist level: Contact Guard/Touching assist Assistive device:  Cane-quad    Walk 150 feet activity   Assist Walk 150 feet activity did not occur: Safety/medical concerns  Assist level: Contact Guard/Touching assist Assistive device: Walker-rolling    Walk 10 feet on uneven surface  activity   Assist     Assist level: Minimal Assistance - Patient > 75% Assistive device:  (railing)   Wheelchair     Assist Is the patient using a wheelchair?: No Type of Wheelchair: Manual    Wheelchair assist level: Dependent - Patient 0%      Wheelchair 50 feet with 2 turns activity    Assist        Assist Level: Dependent - Patient 0%   Wheelchair 150 feet activity     Assist      Assist Level: Dependent - Patient 0%   Blood pressure 122/60, pulse 95,  temperature 97.9 F (36.6 C), temperature source Oral, resp. rate 16, height 5\' 6"  (1.676 m), weight 66.6 kg, SpO2 98 %.  Medical Problem List and Plan: 1. Functional deficits secondary to left caudate tail infarct.has a R HH             -patient may shower             -ELOS/Goals: 7-10 days S- may be ready 2/8 if HH can be set up and BPs remain up              Continue CIR PT, OT 2.  Antithrombotics: -DVT/anticoagulation:  Pharmaceutical: Lovenox             -antiplatelet therapy: DAPT X 3 weeks followed by ASA alone on  09/08/21 3. Chronic pain/myalgias/Pain Management: Continue cymbalta 60 mg bid w/ gabapentin 600/600/1200             --will continue to hold Tizanidine.  4. Mood: LCSW to follow for evaluation and support.              -antipsychotic agents: N/A 5. Neuropsych: This patient is capable of making decisions on her own behalf. 6. Skin/Wound Care: Routine pressure relief measures.  7. Fluids/Electrolytes/Nutrition: Monitor I/O. Check lytes in am.  8. RLL PNA: Treated with ceftriaxone-->Omnicef 3 additional days to complete 5 day course antibiotic regimen             --h/o RLL PNA 11/22  9. Chronic Adrenal insufficiency: now back on hydrocortisone 10 mg daily, given ongoing orthostasis will increase to 20mg   10. T2DM with neuropathy/autonomic dysfunction: Hgb A1C-13.3 and poorly controlled but improved from >15.5 at last admission. Continue to educate on compliance.               --continue Insulin glargine 55 units with novolog 6 units TID for meal coverage.                --Monitor BS ac/hs. Continue SSI for elevated BS. Continue to titrate insulin for tighter BS control.   CBG (last 3)  Recent Labs    08/24/21 1647 08/24/21 2059 08/25/21 0610  GLUCAP 171* 174* 112*   Improved goal is 110-180  2/4: decrease novolog to 6U  11. 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             --will  order orthostatic vitals.THigh hi teds monitor fluid intake              --continue Midodrine 10 mg TID for BP support.    Vitals:  08/25/21 0458 08/25/21 0717  BP: 116/70 122/60  Pulse: 89 95  Resp: 14 16  Temp: 98 F (36.7 C) 97.9 F (36.6 C)  SpO2: 99% 98%  Stabilized on higher dose of hydrocortisone, cont to monitor given systolic BP in 76E with standing on 2/5 , if this remains stable through 2/8 should be able to d/c from a medical standpoint    12. Elevated lipids: Continue lipitor.  13. Chapped lips: ordered Blistex. 14. Periodontal disease: discussed her future plan for tooth removal and dentures. Discussed swishing coconut oil in mouth when she returns home for its antibacterial benefits.   15.  Visual blurring, has some lens opacity likely cataracts , given DM may also have retinopathy , will need optho visit as OP ( has not seen per pt report )  16.  Freq loose stools , improved off omnicef , qd BM  LOS: 5 days A FACE TO FACE EVALUATION WAS PERFORMED  Charlett Blake 08/25/2021, 9:39 AM

## 2021-08-25 NOTE — Discharge Instructions (Addendum)
Inpatient Rehab Discharge Instructions  [name] [name] Discharge date and time:  08/27/21  Activities/Precautions/ Functional Status: Activity: no lifting, driving, or strenuous exercise till cleared by MD Diet: cardiac diet and diabetic diet Wound Care: none needed   Functional status:  ___ No restrictions     ___ Walk up steps independently ___ 24/7 supervision/assistance   ___ Walk up steps with assistance _X__ Intermittent supervision/assistance  ___ Bathe/dress independently ___ Walk with walker     ___ Bathe/dress with assistance ___ Walk Independently    ___ Shower independently ___ Walk with assistance    _X__ Shower with assistance _X__ No alcohol     ___ Return to work/school ________   COMMUNITY REFERRALS UPON DISCHARGE:    Outpatient: PT      OT                 Agency:[facility]     Phone:[phone]              Appointment Date/Time:*Please expect follow-up within 7-10 business days. If you have not received follow-up, be sure to contact directly.*  GENERAL COMMUNITY RESOURCES FOR PATIENT/FAMILY:  *Reminder* Be sure to use the transportation service that is offered through you [place] [employer]. It is [employer] [phone]. Be sure to call to schedule your appointment 3 business days in advance to schedule your appointment.   Special Instructions: Take lukewarm showers. Take your time with positional changes Drink plenty of fluids during the day.  Follow up with cardiology for issues with dizziness and/or blood pressure issues.  Monitor blood sugars before meals and at bedtime.    STROKE/TIA DISCHARGE INSTRUCTIONS SMOKING Cigarette smoking nearly doubles your risk of having a stroke & is the single most alterable risk factor  If you smoke or have smoked in the last 12 months, you are advised to quit smoking for your health. Most of the excess cardiovascular risk related to smoking disappears within a year of stopping. Ask you doctor about  anti-smoking medications Effort Quit Line: 1-800-QUIT NOW Free Smoking Cessation Classes [phone]  CHOLESTEROL Know your levels; limit fat & cholesterol in your diet  Lipid Panel     Component Value Date/Time   CHOL 162 08/18/2021 0227   CHOL 197 03/15/2019 1056   TRIG 109 08/18/2021 0227   HDL 34 (L) 08/18/2021 0227   HDL 36 (L) 03/15/2019 1056   CHOLHDL 4.8 08/18/2021 0227   VLDL 22 08/18/2021 0227   LDLCALC 106 (H) 08/18/2021 0227   LDLCALC 109 (H) 03/15/2019 1056     Many patients benefit from treatment even if their cholesterol is at goal. Goal: Total Cholesterol (CHOL) less than 160 Goal:  Triglycerides (TRIG) less than 150 Goal:  HDL greater than 40 Goal:  LDL (LDLCALC) less than 100   BLOOD PRESSURE American Stroke Association blood pressure target is less that 120/80 mm/Hg  Your discharge blood pressure is:  BP: 122/60 Monitor your blood pressure Limit your salt and alcohol intake Many individuals will require more than one medication for high blood pressure  DIABETES (A1c is a blood sugar average for last 3 months) Goal HGBA1c is under 7% (HBGA1c is blood sugar average for last 3 months)  Diabetes:     Lab Results  Component Value Date   HGBA1C 13.3 (H) 08/17/2021    Your HGBA1c can be lowered with medications, healthy diet, and exercise. Check your blood sugar as directed by your physician Call your physician if you experience unexplained  or low blood sugars.  PHYSICAL ACTIVITY/REHABILITATION Goal is 30 minutes at least 4 days per week  Activity: Increase activity slowly, and No driving, Therapies: see above Return to work: N/A Activity decreases your risk of heart attack and stroke and makes your heart stronger.  It helps control your weight and blood pressure; helps you relax and can improve your mood. Participate in a regular exercise program. Talk with your doctor about the best form of exercise for you (dancing, walking, swimming, cycling).  DIET/WEIGHT  Goal is to maintain a healthy weight  Your discharge diet is:  Diet Order             Diet heart healthy/carb modified Room service appropriate? Yes; Fluid consistency: Thin  Diet effective now                   liquids Your height is:  Height: 5\' 6"  (167.6 cm) Your current weight is: Weight: 66.6 kg Your Body Mass Index (BMI) is:  BMI (Calculated): 23.71 Following the type of diet specifically designed for you will help prevent another stroke. Your goal weight is:   Your goal Body Mass Index (BMI) is 19-24. Healthy food habits can help reduce 3 risk factors for stroke:  High cholesterol, hypertension, and excess weight.  RESOURCES Stroke/Support Group:  Call [phone]   STROKE EDUCATION PROVIDED/REVIEWED AND GIVEN TO PATIENT Stroke warning signs and symptoms How to activate emergency medical system (call 911). Medications prescribed at discharge. Need for follow-up after discharge. Personal risk factors for stroke. Pneumonia vaccine given:  Flu vaccine given:  My questions have been answered, the writing is legible, and I understand these instructions.  I will adhere to these goals & educational materials that have been provided to me after my discharge from the hospital.      My questions have been answered and I understand these instructions. I will adhere to these goals and the provided educational materials after my discharge from the hospital.  Patient/Caregiver Signature _______________________________ Date __________  Clinician Signature _______________________________________ Date __________  Please bring this form and your medication list with you to all your follow-up doctor's appointments.

## 2021-08-25 NOTE — Progress Notes (Signed)
Physical Therapy Session Note  Patient Details  Name: [name] [name] MRN: [id number] Date of Birth: September 17, 1960  Today's Date: 08/25/2021 PT Individual Time: 0900-0955 PT Individual Time Calculation (min): 55 min   Short Term Goals: Week 1:  PT Short Term Goal 1 (Week 1): = to LTGs based on ELOS  Skilled Therapeutic Interventions/Progress Updates:   Received pt sitting upright in bed, pt agreeable to PT treatment, and denied any pain during session. Session with emphasis on functional mobility, generalized strengthening and endurance, dynamic standing balance/coordination, and gait training. BP sitting upright in bed: 141/78 - pt asymptomatic. Donned socks and shoes with set up assist and pt transferred long sitting<>sitting EOB mod I and stand<>pivot bed<>WC without AD and CGA/supervision. Pt transported to main gym in [facility] dependently for time management purposes. Pt navigated 12 steps with R handrail and CGA ascending and descending with a step through pattern with no LOB but reporting having to focus more due to blurred vision. Pt then ambulated 148ft x 2 trials without AD and CGA on trial 1 and close supervision on trial 2, no LOB noted. Pt reported feeling like cane gets in her way and is more of a tripping hazard - pt actually demonstrates improved gait pattern without quad cane. Worked on blocked practice sit<>stands without UE support on Airex 2x10 reps with CGA for balance with emphasis on quad strength. Transitioned to standing alternating toe taps to 6in step 2x20 reps without UE support and CGA for balance. Pt then performed TUG without AD and supervision with average of 12.6 seconds. Trial 1: 16 seconds Trial 2: 12 seconds Trial 3: 10 seconds  Discussed findings/results then pt performed WC mobility 121ft using BLE and supervision back to room with emphasis on hamstring strengthening. Stand<>pivot WC<>recliner without AD and supervision and MD present for morning rounds. Concluded session  with pt sitting in recliner with all needs within reach.   Therapy Documentation Precautions:  Precautions Precautions: Fall, Other (comment) Precaution Comments: R visual field cut, autonomic dysfunction orthostasis Restrictions Weight Bearing Restrictions: No  Therapy/Group: Individual Therapy Alfonse Alpers PT, DPT   08/25/2021, 7:14 AM

## 2021-08-25 NOTE — Progress Notes (Signed)
Physical Therapy Session Note  Patient Details  Name: [name] [name] MRN: [id number] Date of Birth: [birth year]  Today's Date: 08/25/2021 PT Individual Time: 1302-1400 and 1541-1557 PT Individual Time Calculation (min): 58 min and 16 min  Pt missed 14 min of skilled therapy due to fatigue. Will re-attempt as schedule and pt availability permits.   Short Term Goals: Week 1:  PT Short Term Goal 1 (Week 1): = to LTGs based on ELOS  Skilled Therapeutic Interventions/Progress Updates:  Session 1: Patient seated upright in recliner on entrance to room. Patient alert and agreeable to PT session. Although slightly grumpy.  Patient with no pain complaint throughout session.  Therapeutic Activity: Transfers: Patient performed sit<>stand and stand pivot transfers throughout session with supervision/ light CGA. Provided verbal cues for pause in standing to allow for blood pressure to acclimate to positional changes in body.  Gait Training:  Patient ambulated >500 ft using no AD with CGA/ close supervision. Demonstrated intermittent need for stepping strategy to correct balance with head turns/ body turns for distractions and changes in direction. Provided vc/ tc for relating balance offset.  Neuromuscular Re-ed: NMR facilitated during session with focus on dynamic gait/ standing balance. Pt guided in balance challenges using agility ladder. Pt guided in stepping through ladder with one foot in each square x2, then stepping with high knees and focus on single leg stance/ balance shift from back to front foot x3, then guided in following instructions with called side step, fwd/ bkwd, and diagonal step x1. Lateral stepping x2 through ladder with difficulty stepping to L on first pass through and improved balance on 2nd. Finally pt challenged with  NMR performed for improvements in motor control and coordination, balance, sequencing, judgement, and self confidence/ efficacy in performing all aspects of  mobility at highest level of independence.   Patient seated  in recliner at end of session with brakes locked, belt alarm set, and all needs within reach. Oriented to time and time of next therapy session.   No pain complaint this session.  Session 2: Patient seated in recliner asleep on entrance to room. Patient easily roused, alert and relates fatigue with request to rest after last therapy session. Does relate need to toilet.  Ambulatory transfer from recliner to bathroom with close supervision. Toilet transfer, clothing mgmt, and pericare performed with distant supervision/ Mod I for time and use of safety rails. Pt leaves bathroom and washes hands at sink with supervision. Ambulatory transfer to bed as pt wishes to lie down for improved positioning to rest. Pt doffs shoes and TED hose. SETUP for bed linens and pt able to arrange for comfort. Bed positioning arranged for pt comfort.   Patient with no pain complaint throughout session.  Patient supine  in bed at end of session with brakes locked, bed alarm set, and all needs within reach.    Therapy Documentation Precautions:  Precautions Precautions: Fall, Other (comment) Precaution Comments: R visual field cut, autonomic dysfunction orthostasis Restrictions Weight Bearing Restrictions: No General:   Vital Signs: Therapy Vitals Temp: 98.4 F (36.9 C) Temp Source: Oral Pulse Rate: 95 Resp: 16 BP: (!) 144/81 Patient Position (if appropriate): Sitting Oxygen Therapy SpO2: 99 % O2 Device: Room Air Pain:  No pain complaint this day.   Therapy/Group: Individual Therapy  Alger Simons PT, DPT 08/25/2021, 4:00 PM

## 2021-08-25 NOTE — Progress Notes (Signed)
Occupational Therapy Session Note  Patient Details  Name: [name] [name] MRN: [id number] Date of Birth: 11/23/1960  Today's Date: 08/25/2021 OT Individual Time: 1031-1130 OT Individual Time Calculation (min): 59 min    Short Term Goals: Week 1:  OT Short Term Goal 1 (Week 1): STGs equal LTGs set at modified independent overall.  Skilled Therapeutic Interventions/Progress Updates:  Pt greeted seated in recliner agreeable to OT intervention. Session focus on BADL reeducation, functional mobility, dynamic standing balance, IADLs and decreasing overall caregiver burden.  Standing BP obtained prior to shower in standing, 125/80 no c/o dizziness during session, session conducted without TEDS or ABD.  Pt ambulated to bathroom with no AD and supervision. Pt doffed clothes from standing with one LOB needing MIN A to recover, pt completed shower to TTB with supervision. Pt did stand at times for LB bathing with overall supervision. Pt exited shower with supervision with pt completing dressing from w/c in bathroom MODI as pt able to collet clothes prior to shower. Pt stood at sink for standing grooming tasks MODI . Pt able to ambulate to ADL apt with no AD and supervision. Pt completed simple meal prep of making peanut butter crackers with pt having to reach [facility] and below for needed items with overall supervision. Education provided on general fall prevention and energy conservation for kitchen tasks. Pt ambulated back to room with no AD and supervision, pt left seated in recliner with all needs within reach.                       Therapy Documentation Precautions:  Precautions Precautions: Fall, Other (comment) Precaution Comments: R visual field cut, autonomic dysfunction orthostasis Restrictions Weight Bearing Restrictions: No   Pain: no pain reported during session     Therapy/Group: Individual Therapy  Precious Haws 08/25/2021, 12:14 PM

## 2021-08-25 NOTE — Discharge Summary (Signed)
Physician Discharge Summary  Patient ID: [name] [name] MRN: [id number] DOB/AGE: 62-14-1962 62 y.o.  Admit date: 08/20/2021 Discharge date: 08/27/2021  Discharge Diagnoses:  Principal Problem:   Stroke (cerebrum) [facility]) Active Problems:   Uncontrolled type 2 diabetes mellitus with hyperglycemia, with long-term current use of insulin (HCC)   Diabetic polyneuropathy associated with type 2 diabetes mellitus (HCC)   Orthostatic hypotension   Visual field constriction of right eye   Discharged Condition: good  Significant Diagnostic Studies: N/a   Labs:  Basic Metabolic Panel: Recent Labs  Lab 08/21/21 0518 08/25/21 0531  NA 140 141  K 3.5 3.7  CL 105 105  CO2 27 28  GLUCOSE 139* 121*  BUN 10 12  CREATININE 0.50 0.64  CALCIUM 8.9 8.8*    CBC: Recent Labs  Lab 08/21/21 0518 08/25/21 0531  WBC 5.7 5.3  NEUTROABS 2.5  --   HGB 12.4 11.8*  HCT 37.6 36.0  MCV 93.5 94.7  PLT 195 204    CBG: Recent Labs  Lab 08/26/21 1633 08/26/21 2059 08/27/21 0555 08/27/21 0630 08/27/21 1156  GLUCAP 160* 187* 51* 82 198*    Brief HPI:   [name] [name] is a 61 y.o. female is a 61 year old right-handed female with history of T2DM with neuropathy, chronic pain, adrenal insufficiency, autonomic dysfunction with orthostasis who was admitted on 08/16/2021 with 4-day history of blurry vision and dizziness.  She was found to have RLL PNA as well as stroke due to acute infarct left caudate body.  She was started on antibiotics as well as stress dose steroids for treatment.  Dr. Leonie Man recommended DAPT x3 weeks followed by aspirin alone for stroke due to small vessel disease.  She continued to be limited by decreased sensation on right side with decrease in motor control, blurry vision with question of R HH as well as cognitive deficits affecting mobility and ADLs.  CIR was recommended due to functional decline.   Hospital Course: [name] [name] was admitted to rehab 08/20/2021 for inpatient  therapies to consist of PT, ST and OT at least three hours five days a week. Past admission physiatrist, therapy team and rehab RN have worked together to provide customized collaborative inpatient rehab.  Blood pressures were monitored on TID basis and she was noted to have orthostatic changes with syncope initially. Hydrocortisone was increased to 20 mg daily in addition to abdominal binder and KHT for BP support. She was also encouraged to increase fluid intake. Her blood pressures have improved with increase in steroids. She was maintained on Lipitor and admission LFTs were noted to be WNL. Her diabetes has been monitored with ac/hs CBG checks and SSI was use prn for tighter BS control. She has been educated on insulin use, management and administration as well as importance of compliance with current regimen.  She completed course of omnicef on 02/02 and fiber con was added to help with loose stools. Follow up CBC showed slight drop in H/H without signs of bleeding. Platelets and WBC are WNL. Check of BMET showed that renal status and lytes are WNL. She is to stop plavix after 12 additional days.  She has made steady gains during her rehab stay and is modified independent at discharge. She continues to have right fisual field deficits question field cut v/s baseline decreased acuity per OT evaluation. She will continue to receive follow up outpatient PT and OT at [facility] after discharge.    Rehab course: During patient's stay in rehab  team conference was held to monitor patient's progress, set goals and discuss barriers to discharge. At admission, patient required min assist with ADL tasks and with mobility. Speech was clear with cognitive eval revealing SLUMS score - 29/30 therefore ST not needed during her stay. She  has had improvement in activity tolerance, balance, postural control as well as ability to compensate for deficits. She has had improvement in functional use RUE and RLE as well as  improvement in awareness. She is able to complete ADL tasks at modified independent level. She is independent for transfers and to ambulate 800' without AD.   Disposition: Home  Diet: Carb modified/heart healthy.   Special Instructions: No driving or strenuous activity.  Stop Plavix after 02/19. To continue ASA indefinitely for stroke prophylaxis. 3. Continue to monitor BS BS ac/hs and follow up with PCP for further adjustment.  4. Repeat CBC in 1-2 weeks to monitor H/H.  Discharge Instructions     Ambulatory referral to Neurology   Complete by: As directed    An appointment is requested in approximately: 4 weeks for stroke follow up   Ambulatory referral to Physical Medicine Rehab   Complete by: As directed    Hospital follow up       Allergies as of 08/27/2021       Reactions   Tramadol Nausea And Vomiting   REACTION: Projectile vomiting        Medication List     STOP taking these medications    cefdinir 300 MG capsule Commonly known as: OMNICEF   docusate sodium 100 MG capsule Commonly known as: COLACE   tiZANidine 4 MG tablet Commonly known as: ZANAFLEX       TAKE these medications    Accu-Chek Guide test strip Generic drug: glucose blood Use as instructed tid   Accu-Chek Guide w/Device Kit Use as directed tid   Accu-Chek Softclix Lancets lancets Use as instructed tid before meals   acetaminophen 325 MG tablet Commonly known as: TYLENOL Take 1-2 tablets (325-650 mg total) by mouth every 4 (four) hours as needed for mild pain. What changed:  medication strength how much to take when to take this reasons to take this   aspirin 81 MG EC tablet Take 1 tablet (81 mg total) by mouth daily. Swallow whole. Notes to patient: Purchase this over the counter and continue indefinitely.    atorvastatin 40 MG tablet Commonly known as: LIPITOR Take 1 tablet (40 mg total) by mouth daily.   clopidogrel 75 MG tablet Commonly known as: PLAVIX Take 1  tablet (75 mg total) by mouth daily for 12 days.   DULoxetine 60 MG capsule Commonly known as: CYMBALTA Take 1 capsule (60 mg total) by mouth 2 (two) times daily.   gabapentin 600 MG tablet Commonly known as: NEURONTIN TAKE ONE TABLET (600 MG) BY MOUTH EVERY MORNING AND AFTERNOON, TAKE TWO TABLETS AT NIGHT What changed:  how much to take how to take this when to take this   hydrocortisone 20 MG tablet Commonly known as: CORTEF Take 1 tablet (20 mg total) by mouth daily. What changed:  medication strength how much to take   insulin aspart 100 UNIT/ML injection Commonly known as: novoLOG Inject 6 Units into the skin 3 (three) times daily with meals. What changed: Another medication with the same name was removed. Continue taking this medication, and follow the directions you see here.   insulin glargine-yfgn 100 UNIT/ML injection Commonly known as: SEMGLEE Inject 0.55 mLs (55 Units  total) into the skin at bedtime.   midodrine 10 MG tablet Commonly known as: PROAMATINE Take 1 tablet (10 mg total) by mouth 3 (three) times daily with meals. Notes to patient:  7 am, noon and 5 pm   Pen Needles 31G X 8 MM Misc Use as directed   polycarbophil 625 MG tablet Commonly known as: FIBERCON Take 1 tablet (625 mg total) by mouth daily. Notes to patient: Purchase over the counter   polyethylene glycol 17 g packet Commonly known as: MIRALAX / GLYCOLAX Take 17 g by mouth daily as needed for mild constipation.        Follow-up Information     Kirsteins, Luanna Salk, MD Follow up.   Specialty: Physical Medicine and Rehabilitation Why: office will call you with follow up appointment Contact information: [place] [address] [phone]         [facility] Follow up.   Why: office will call you with follow up appointment Contact information: [place] [address] [phone]         Gevena Cotton, MD Follow up on 10/03/2021.   Specialty: Ophthalmology Why: Be there at 10:15 for 10:30 am appointment Contact information: [place] [address] [phone]         London Pepper, MD. Call.   Specialty: Family Medicine Why: for post hospital follow up Contact information: [place] [address] [phone]                 Signed: Bary Leriche 08/27/2021, 6:32 PM

## 2021-08-26 LAB — GLUCOSE, CAPILLARY
Glucose-Capillary: 98 mg/dL (ref 70–99)
Glucose-Capillary: 120 mg/dL — ABNORMAL HIGH (ref 70–99)
Glucose-Capillary: 160 mg/dL — ABNORMAL HIGH (ref 70–99)
Glucose-Capillary: 187 mg/dL — ABNORMAL HIGH (ref 70–99)

## 2021-08-26 NOTE — Progress Notes (Signed)
Physical Therapy Discharge Summary  Patient Details  Name: [name] [name] MRN: [id number] Date of Birth: 1961/07/03  Today's Date: 08/26/2021 PT Individual Time: 1301-1420 PT Individual Time Calculation (min): 79 min    Patient has met 9 of 9 long term goals due to improved activity tolerance, improved balance, increased strength, functional use of  right upper extremity and right lower extremity, improved awareness, and improved coordination.  Patient to discharge at an ambulatory level Independent.   Patient's care partner is independent to provide the necessary cognitive assistance at discharge.  Reasons goals not met: n/a  Recommendation:  Patient will benefit from ongoing skilled PT services in neuro outpatient setting to continue to advance safe functional mobility, address ongoing impairments in strength, coordination, balance, activity tolerance, cognition, safety awareness, and minimize fall risk.  Equipment: No equipment provided  Reasons for discharge: treatment goals met and discharge from hospital  Patient/family agrees with progress made and goals achieved: Yes  PT Discharge Precautions/Restrictions Precautions Precautions: Fall;Other (comment) Precaution Comments: blurring of vision, autonomic dysfunction orthostasis Vital Signs Therapy Vitals Temp: 97.8 F (36.6 C) Temp Source: Oral Pulse Rate: 92 Resp: 16 BP: 131/81 Patient Position (if appropriate): Sitting Oxygen Therapy SpO2: 100 % O2 Device: Room Air Pain Pain Assessment Pain Scale: 0-10 Pain Score: 0-No pain Pain Interference Pain Interference Pain Effect on Sleep: 1. Rarely or not at all Pain Interference with Therapy Activities: 1. Rarely or not at all Pain Interference with Day-to-Day Activities: 1. Rarely or not at all Vision/Perception  Vision - History Ability to See in Adequate Light: 1 Impaired (unable to see large text, able to read fine text with increased time and readers  donned) Vision - Assessment Eye Alignment: Within Functional Limits Ocular Range of Motion: Within Functional Limits Alignment/Gaze Preference: Within Defined Limits Tracking/Visual Pursuits: Able to track stimulus in all quads without difficulty Saccades: Additional head turns occurred during testing;Decreased speed of saccadic movement Convergence: Within functional limits Perception Perception: Within Functional Limits Praxis Praxis: Intact  Cognition Overall Cognitive Status: Within Functional Limits for tasks assessed Orientation Level: Oriented X4 Year: 2023 Month: February Day of Week: Correct Focused Attention: Appears intact Sustained Attention: Appears intact Selective Attention: Appears intact Alternating Attention: Appears intact Memory: Appears intact Problem Solving: Appears intact Safety/Judgment: Appears intact Sensation Sensation Light Touch: Impaired Detail Peripheral sensation comments: 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  Mobility Bed Mobility Bed Mobility: Rolling Right;Rolling Left;Supine to Sit;Sit to Supine Rolling Right: Independent Rolling Left: Independent Supine to Sit: Independent Sit to Supine: Independent Transfers Transfers: Sit to Stand;Stand to Sit;Stand Pivot Transfers Sit to Stand: Independent Stand to Sit: Independent Stand Pivot Transfers: Independent Stand Pivot Transfer Details: Verbal cues for precautions/safety Transfer (Assistive device): None Locomotion  Gait Ambulation: Yes Gait Assistance: Independent Gait Distance (Feet): 800  Feet Assistive device: None Gait Assistance Details: Verbal cues for precautions/safety Gait Gait: Yes Gait Pattern: Within Functional Limits Gait Pattern:  (B toe out) Gait velocity: slightly decreased Stairs / Additional Locomotion Stairs: Yes Stairs Assistance: Independent with assistive device Stair Management Technique: One rail Right Number of Stairs: 12 Height of Stairs: 6 Ramp: Independent with assistive device Curb: Independent Pick  up small object from the floor assist level: Supervision/Verbal cueing  Trunk/Postural Assessment  Cervical Assessment Cervical Assessment: Exceptions to [facility] (slight forward head) Thoracic Assessment Thoracic Assessment: Exceptions to [facility] (minimal rounding of shoulders) Lumbar Assessment Lumbar Assessment: Within Functional Limits Postural Control Postural Control: Within Functional Limits  Balance Balance Balance Assessed: Yes Standardized Balance Assessment Standardized Balance Assessment: Berg Balance Test;Functional Gait Assessment Berg Balance Test Sit to Stand: Able to stand without using hands and stabilize independently Standing Unsupported: Able to stand safely 2 minutes Sitting with Back Unsupported but Feet Supported on Floor or Stool: Able to sit safely and securely 2 minutes Stand to Sit: Controls descent by using hands Transfers: Able to transfer safely, definite need of hands Standing Unsupported with Eyes Closed: Able to stand 10 seconds with supervision Standing Ubsupported with Feet Together: Able to place feet together independently and stand for 1 minute with supervision From Standing, Reach Forward with Outstretched Arm: Can reach forward >12 cm safely (5") From Standing Position, Pick up Object from Floor: Able to pick up shoe safely and easily From Standing Position, Turn to Look Behind Over each Shoulder: Looks behind one side only/other side shows less weight shift Turn 360 Degrees: Able to turn 360 degrees  safely one side only in 4 seconds or less Standing Unsupported, Alternately Place Feet on Step/Stool: Able to stand independently and complete 8 steps >20 seconds Standing Unsupported, One Foot in Front: Able to plae foot ahead of the other independently and hold 30 seconds Standing on One Leg: Able to lift leg independently and hold equal to or more than 3 seconds Total Score: 45 Static Sitting Balance Static Sitting - Balance Support: Feet supported Static Sitting - Level of Assistance: 7: Independent Dynamic Sitting Balance Dynamic Sitting - Balance Support: During functional activity;Feet supported Dynamic Sitting - Level of Assistance: 7: Independent Static Standing Balance Static Standing - Balance Support: No upper extremity supported;During functional activity Static Standing - Level of Assistance: 7: Independent Dynamic Standing Balance Dynamic Standing - Balance Support: During functional activity;No upper extremity supported Dynamic Standing - Level of Assistance: 7: Independent Dynamic Standing - Comments: throwing bean bags Functional Gait  Assessment Gait Level Surface: Walks 20 ft in less than 5.5 sec, no assistive devices, good speed, no evidence for imbalance, normal gait pattern, deviates no more than 6 in outside of the 12 in walkway width. Change in Gait Speed: Makes only minor adjustments to walking speed, or accomplishes a change in speed with significant gait deviations, deviates 10-15 in outside the 12 in walkway width, or changes speed but loses balance but is able to recover and continue walking. Gait with Horizontal Head Turns: Performs head turns smoothly with no change in gait. Deviates no more than 6 in outside 12 in walkway width Gait with Vertical Head Turns: Performs task with slight change in gait velocity (eg, minor disruption to smooth gait path), deviates 6 - 10 in outside 12 in walkway width or uses assistive device Gait and Pivot Turn: Pivot turns safely  in greater than 3 sec and stops with no loss of balance, or pivot turns safely within 3 sec and stops with mild imbalance, requires small steps to catch balance. Step Over Obstacle: Is able to step over one shoe box (4.5 in total height) without changing gait speed. No evidence of imbalance. Gait with Narrow Base of Support: Ambulates 4-7 steps. Gait with Eyes Closed: Walks 20 ft, uses assistive device, slower speed, mild gait deviations, deviates 6-10 in  outside 12 in walkway width. Ambulates 20 ft in less than 9 sec but greater than 7 sec. Ambulating Backwards: Walks 20 ft, no assistive devices, good speed, no evidence for imbalance, normal gait Steps: Alternating feet, no rail. Total Score: 22 Extremity Assessment      RLE Assessment RLE Assessment: Exceptions to [facility] RLE Strength Right Hip Flexion: 4-/5 Right Knee Flexion: 4-/5 Right Knee Extension: 4-/5 Right Ankle Dorsiflexion: 4-/5 Right Ankle Plantar Flexion: 4-/5 LLE Assessment LLE Assessment: Exceptions to [facility] LLE Strength Left Hip Flexion: 4-/5 Left Knee Flexion: 4-/5 Left Knee Extension: 4/5 Left Ankle Dorsiflexion: 4-/5 Left Ankle Plantar Flexion: 4-/5  Skilled Therapeutic Interventions: Patient seated upright in recliner on entrance to room. Patient alert and agreeable to PT session.   Patient with no pain complaint throughout session.  Therapeutic Activity: Bed Mobility: Patient performed supine <> sit on ADL apartment bed with IND. No cueing required. Transfers: Patient performed sit<>stand and stand pivot transfers throughout session with IND/ ModI. Provided vc as reminder to self-assess dizziness on each rise to stand. With any mild dizziness, pt to sit back down until resolves and attempt again.   Gait Training:  Patient ambulated >800 ft using no AD with IND. Supervision provided during instances of FGA performance. Also ambulated throughout room and therapy gym with no AD and IND. Demonstrated good balance  throughout.   Pt also completes 3x four 6" steps using R HR only and reciprocating step pattern.  Neuromuscular Re-ed: NMR facilitated during session with focus on standing balance. Pt guided in Berg Balance Assessment and Functional Gait Assessment. Pt continues to demonstrate difficulty with NBOS, single leg stance, and more dynamic activities performed slowly.   Patient demonstrates increased fall risk as noted by score of   45/56 on Berg Balance Scale.  (<36= high risk for falls, close to 100%; 37-45 significant >80%; 46-51 moderate >50%; 52-55 lower >25%)  Patient demonstrates increased fall risk as noted by score of 22/30 on  Functional Gait Assessment.   <22/30 = predictive of falls, <20/30 = fall in 6 months, <18/30 = predictive of falls in PD MCID: 5 points stroke population, 4 points geriatric population (ANPTA Core Set of Outcome Measures for Adults with Neurologic Conditions, 2018)   Pt also guided in Wii Balance game requiring lateral balance adjustments to collect points. Pt improves with each play of 1 minute x3. Completes with supervision throughout.  NMR performed for improvements in motor control and coordination, balance, sequencing, judgement, and self confidence/ efficacy in performing all aspects of mobility at highest level of independence.   IND toilet transfer at end of session but pt unable to pass painful BM. Pt provided with education re: pelvic floor musculature and need to relax at moment. Guided to recliner after minA for donning brief in case of accident. Pt made IND in room but reminded to take time, call for nursing if she would like assistance, and self-assess for dizziness with each rise to stand. Pt relates understanding. NT notified re: upgrade to IND in room. RN also notified and informed re: BM.   Patient seated  in recliner at end of session with brakes locked,  and all needs within reach.   Alger Simons 08/26/2021, 4:33 PM

## 2021-08-26 NOTE — Progress Notes (Signed)
Physical Therapy Session Note  Patient Details  Name: [name] [name] MRN: [id number] Date of Birth: 1960-12-30  Today's Date: 08/26/2021 PT Individual Time: 0803-0859 PT Individual Time Calculation (min): 56 min   Short Term Goals: Week 1:  PT Short Term Goal 1 (Week 1): = to LTGs based on ELOS  Skilled Therapeutic Interventions/Progress Updates:  Patient supine in bed on entrance to room. Patient alert and agreeable to PT session.   Patient with complaint of muscle soreness in B quadriceps from yesterday's session.  Therapeutic Activity: Bed Mobility: Patient performed supine <> sit with Mod I using bedrail to assist. Transfers: Patient performed sit<>stand and stand pivot transfers throughout session with Mod I using armrests.  Education provided re: need to give body time to adjust to changes in BP. Pt relates having orthostasis on standing this morning and continued to bathroom while furniture walking. Related to pt that safer option is to sit back down on onset of dizziness, rest for brief time, attempt to stand again, and reassess.   Therapeutic Exercise: After c/o quad soreness, pt guided in standing quad stretch with 15sec holds to each LE, then supine holds of 15 sec x4 to each LE for distal quad stretch. VC/ tc provided for proper technique and educated to continue to perform throughout the day.  Gait Training/ NMR:  Pt guided in  obstacle course with 6" hurdles, Airex, "S" cones for tight turns, ramped foam, then tossing 11 beanbags to target board placed 12' away. In second pass of obstacle course, difficulty increased with dual task of passing 2# ball, performing chest press, then passing ball all throughout ambulation.   Pt ambulated >800' with no AD and with distractions and obstacles in hallway. No LOB or increase in sway/ deviation from path.  NMR performed for improvements in motor control and coordination, balance, sequencing, judgement, and self confidence/ efficacy in  performing all aspects of mobility at highest level of independence.   Education provided again re: need to pause on standing in order to self assess for s/s of orthostasis for repetition and improved learning.   Patient seated upright  in recliner at end of session with brakes locked, belt alarm set, and all needs within reach.  Therapy Documentation Precautions:  Precautions Precautions: Fall, Other (comment) Precaution Comments: R visual field cut, autonomic dysfunction orthostasis Restrictions Weight Bearing Restrictions: No General:   Vital Signs:  Pain:  Mild soreness in anterior distal quads addressed with gentle self stretching.   Therapy/Group: Individual Therapy  Alger Simons PT, DPT 08/26/2021, 8:13 AM

## 2021-08-26 NOTE — Patient Care Conference (Signed)
Inpatient RehabilitationTeam Conference and Plan of Care Update Date: 08/25/2021   Time: 11:11 AM    Patient Name: [name] [name]      Medical Record Number: [id number]  Date of Birth: 1961-04-14 Sex: Female         Room/Bed: 4W07C/4W07C-01 Payor Info: Payor: MEDICARE / Plan: MEDICARE PART A AND B / Product Type: *No Product type* /    Admit Date/Time:  08/20/2021  3:06 PM  Primary Diagnosis:  Stroke (cerebrum) [facility])  Hospital Problems: Principal Problem:   Stroke (cerebrum) [facility])    Expected Discharge Date: Expected Discharge Date: 08/27/21  Team Members Present: Physician leading conference: Dr. Alysia Penna Social Worker Present: Loralee Pacas, [place] Nurse Present: Dorien Chihuahua, RN PT Present: Alden Hipp, PT OT Present: Providence Lanius, OT     Current Status/Progress Goal Weekly Team Focus  Bowel/Bladder     Patient is continent   Continent   Toileting  Swallow/Nutrition/ Hydration             ADL's   ind dressing, bathroom transfers, toileting, grooming, self-feeding  surpassed mod I goals  goals met   Mobility             Communication             Safety/Cognition/ Behavioral Observations            Pain     N/A        Skin     N/A          Discharge Planning:      Team Discussion: Doing well Patient on target to meet rehab goals: yes, on target to meet goals for discharge  *See Care Plan and progress notes for long and short-term goals.   Revisions to Treatment Plan:  N/A  Teaching Needs: Medications, insulin administration, secondary stroke risk management, safety, transfers, toileting, etc  Current Barriers to Discharge: Decreased caregiver support  Possible Resolutions to Barriers: Family education with daughter Recommended Medicaid transportation to assist with appointments OP follow up services DME: BSC/3N1 recommended; patient declined, bathroom is too small     Medical Summary Current Status: Orthostaic hypotension  improving, DM control improving  Barriers to Discharge: Medical stability   Possible Resolutions to Barriers/Weekly Focus: Cont to monitor BP when up, adjust BP and DM meds   Continued Need for Acute Rehabilitation Level of Care: The patient requires daily medical management by a physician with specialized training in physical medicine and rehabilitation for the following reasons: Direction of a multidisciplinary physical rehabilitation program to maximize functional independence : Yes Medical management of patient stability for increased activity during participation in an intensive rehabilitation regime.: Yes Analysis of laboratory values and/or radiology reports with any subsequent need for medication adjustment and/or medical intervention. : Yes   I attest that I was present, lead the team conference, and concur with the assessment and plan of the team.   Dorien Chihuahua B 08/26/2021, 3:45 PM

## 2021-08-26 NOTE — Plan of Care (Signed)
Problem: RH Balance Goal: LTG Patient will maintain dynamic standing with ADLs (OT) Description: LTG:  Patient will maintain dynamic standing balance with assist during activities of daily living (OT)  Outcome: Completed/Met   Problem: Sit to Stand Goal: LTG:  Patient will perform sit to stand in prep for activites of daily living with assistance level (OT) Description: LTG:  Patient will perform sit to stand in prep for activites of daily living with assistance level (OT) Outcome: Completed/Met   Problem: RH Eating Goal: LTG Patient will perform eating w/assist, cues/equip (OT) Description: LTG: Patient will perform eating with assist, with/without cues using equipment (OT) Outcome: Completed/Met   Problem: RH Grooming Goal: LTG Patient will perform grooming w/assist,cues/equip (OT) Description: LTG: Patient will perform grooming with assist, with/without cues using equipment (OT) Outcome: Completed/Met   Problem: RH Bathing Goal: LTG Patient will bathe all body parts with assist levels (OT) Description: LTG: Patient will bathe all body parts with assist levels (OT) Outcome: Completed/Met   Problem: RH Dressing Goal: LTG Patient will perform upper body dressing (OT) Description: LTG Patient will perform upper body dressing with assist, with/without cues (OT). Outcome: Completed/Met Goal: LTG Patient will perform lower body dressing w/assist (OT) Description: LTG: Patient will perform lower body dressing with assist, with/without cues in positioning using equipment (OT) Outcome: Completed/Met   Problem: RH Toileting Goal: LTG Patient will perform toileting task (3/3 steps) with assistance level (OT) Description: LTG: Patient will perform toileting task (3/3 steps) with assistance level (OT)  Outcome: Completed/Met   Problem: RH Simple Meal Prep Goal: LTG Patient will perform simple meal prep w/assist (OT) Description: LTG: Patient will perform simple meal prep with  assistance, with/without cues (OT). Outcome: Completed/Met   Problem: RH Light Housekeeping Goal: LTG Patient will perform light housekeeping w/assist (OT) Description: LTG: Patient will perform light housekeeping with assistance, with/without cues (OT). Outcome: Completed/Met   Problem: RH Toilet Transfers Goal: LTG Patient will perform toilet transfers w/assist (OT) Description: LTG: Patient will perform toilet transfers with assist, with/without cues using equipment (OT) Outcome: Completed/Met   Problem: RH Tub/Shower Transfers Goal: LTG Patient will perform tub/shower transfers w/assist (OT) Description: LTG: Patient will perform tub/shower transfers with assist, with/without cues using equipment (OT) Outcome: Completed/Met   Problem: RH Memory Goal: LTG Patient will demonstrate ability for day to day recall/carry over during activities of daily living with assistance level (OT) Description: LTG:  Patient will demonstrate ability for day to day recall/carry over during activities of daily living with assistance level (OT). Outcome: Completed/Met

## 2021-08-26 NOTE — Progress Notes (Signed)
PROGRESS NOTE   Subjective/Complaints: Walking very well in hallway with PT Asks if she can go home tomorrow- discussed with team that she can Asked Pam to schedule neuro-ophthalmology follow-up  ROS neg CP, SOB, N/V/D, +orthostatic hypotension- improved  Objective:   No results found. Recent Labs    08/25/21 0531  WBC 5.3  HGB 11.8*  HCT 36.0  PLT 204   Recent Labs    08/25/21 0531  NA 141  K 3.7  CL 105  CO2 28  GLUCOSE 121*  BUN 12  CREATININE 0.64  CALCIUM 8.8*    Intake/Output Summary (Last 24 hours) at 08/26/2021 1151 Last data filed at 08/26/2021 0807 Gross per 24 hour  Intake 480 ml  Output --  Net 480 ml        Physical Exam: Vital Signs Blood pressure 108/64, pulse (!) 103, temperature 97.8 F (36.6 C), temperature source Oral, resp. rate 18, height 5\' 6"  (1.676 m), weight 66.6 kg, SpO2 98 %.  Gen: no distress, normal appearing HEENT: oral mucosa pink and moist, NCAT Cardio: Reg rate Chest: normal effort, normal rate of breathing Abd: soft, non-distended Ext: no edema Psych: pleasant, normal affect Skin: intact  Neurologic: Cranial nerves II through XII intact, motor strength is 5/5 in bilateral deltoid, bicep, tricep, grip, hip flexor, knee extensors, ankle dorsiflexor and plantar flexor  Musculoskeletal: Full range of motion in all 4 extremities. No joint swelling   Assessment/Plan: 1. Functional deficits which require 3+ hours per day of interdisciplinary therapy in a comprehensive inpatient rehab setting. Physiatrist is providing close team supervision and 24 hour management of active medical problems listed below. Physiatrist and rehab team continue to assess barriers to discharge/monitor patient progress toward functional and medical goals  Care Tool:  Bathing    Body parts bathed by patient: Right arm, Left arm, Chest, Abdomen, Front perineal area, Buttocks, Right upper leg,  Left upper leg, Face, Left lower leg, Right lower leg         Bathing assist Assist Level: Independent with assistive device Assistive Device Comment: TTB   Upper Body Dressing/Undressing Upper body dressing   What is the patient wearing?: Pull over shirt    Upper body assist Assist Level: Independent    Lower Body Dressing/Undressing Lower body dressing      What is the patient wearing?: Pants, Underwear/pull up     Lower body assist Assist for lower body dressing: Independent     Toileting Toileting    Toileting assist Assist for toileting: Independent     Transfers Chair/bed transfer  Transfers assist     Chair/bed transfer assist level: Supervision/Verbal cueing Chair/bed transfer assistive device: Research officer, political party   Ambulation assist      Assist level: Supervision/Verbal cueing Assistive device: No Device Max distance: 190ft   Walk 10 feet activity   Assist     Assist level: Supervision/Verbal cueing Assistive device: No Device   Walk 50 feet activity   Assist    Assist level: Supervision/Verbal cueing Assistive device: No Device    Walk 150 feet activity   Assist Walk 150 feet activity did not occur: Safety/medical concerns  Assist level:  Supervision/Verbal cueing Assistive device: No Device    Walk 10 feet on uneven surface  activity   Assist     Assist level: Minimal Assistance - Patient > 75% Assistive device:  (railing)   Wheelchair     Assist Is the patient using a wheelchair?: No Type of Wheelchair: Manual    Wheelchair assist level: Supervision/Verbal cueing Max wheelchair distance: 166ft    Wheelchair 50 feet with 2 turns activity    Assist        Assist Level: Supervision/Verbal cueing   Wheelchair 150 feet activity     Assist      Assist Level: Supervision/Verbal cueing   Blood pressure 108/64, pulse (!) 103, temperature 97.8 F (36.6 C), temperature source Oral,  resp. rate 18, height 5\' 6"  (1.676 m), weight 66.6 kg, SpO2 98 %.  Medical Problem List and Plan: 1. 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  Antithrombotics: -DVT/anticoagulation:  Pharmaceutical: Lovenox             -antiplatelet therapy: DAPT X 3 weeks followed by ASA alone on  09/08/21 3. Chronic pain/myalgias/Pain Management: Continue cymbalta 60 mg bid w/ gabapentin 600/600/1200             --will continue to hold Tizanidine.  4. Mood: LCSW to follow for evaluation and support.              -antipsychotic agents: N/A 5. Neuropsych: This patient is capable of making decisions on her own behalf. 6. Skin/Wound Care: Routine pressure relief measures.  7. Fluids/Electrolytes/Nutrition: Monitor I/O. Check lytes in am.  8. RLL PNA: Treated with ceftriaxone-->Omnicef 3 additional days to complete 5 day course antibiotic regimen             --h/o RLL PNA 11/22  9. Chronic Adrenal insufficiency: now back on hydrocortisone 10 mg daily, given ongoing orthostasis will increase to 20mg   10. T2DM with neuropathy/autonomic dysfunction: Hgb A1C-13.3 and poorly controlled but improved from >15.5 at last admission. Continue to educate on compliance.               --continue Insulin glargine 55 units with novolog 6 units TID for meal coverage.                --Monitor BS ac/hs. Continue SSI for elevated BS. Continue to titrate insulin for tighter BS control.   CBG (last 3)  Recent Labs    08/25/21 1650 08/25/21 2119 08/26/21 0622  GLUCAP 243* 266* 98  Improved goal is 110-180  2/4: decrease novolog to 6U  11. 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             --will order orthostatic vitals.THigh hi teds monitor fluid intake               --continue Midodrine 10 mg TID for BP support.    Vitals:   08/25/21 1933 08/26/21 0354  BP: (!) 150/77 108/64  Pulse: 100 (!) 103  Resp: 16 18  Temp: 97.8 F (36.6 C) 97.8 F (36.6 C)  SpO2: 97% 98%  Stabilized on higher dose of hydrocortisone, cont to monitor given systolic BP in 66M with standing on 2/5 , if this remains stable through 2/8 should be able to d/c from a medical standpoint   2/7: discussed she is clear to d/c tomorrow as Bps have been relatively stable   12. Elevated lipids: Continue lipitor.  13. Chapped lips: ordered Blistex. 14. Periodontal disease: discussed her future plan for tooth removal and dentures. Discussed swishing coconut oil in mouth when she returns home for its antibacterial benefits.   15.  Visual blurring, has some lens opacity likely cataracts , given DM may also have retinopathy , will need optho visit as OP ( has not seen per pt report )- asked Pam to schedule.  16.  Freq loose stools , improved off omnicef , qd BM  17. Tachycardia: continue to monitor TID.  LOS: 6 days A FACE TO FACE EVALUATION WAS PERFORMED  Clide Deutscher Nadine Ryle 08/26/2021, 11:51 AM

## 2021-08-26 NOTE — Plan of Care (Signed)
Problem: RH Balance Goal: LTG Patient will maintain dynamic sitting balance (PT) Description: LTG:  Patient will maintain dynamic sitting balance with assistance during mobility activities (PT) Outcome: Completed/Met Flowsheets (Taken 08/26/2021 1703) LTG: Pt will maintain dynamic sitting balance during mobility activities with:: Independent Goal: LTG Patient will maintain dynamic standing balance (PT) Description: LTG:  Patient will maintain dynamic standing balance with assistance during mobility activities (PT) Outcome: Completed/Met Flowsheets (Taken 08/26/2021 1703) LTG: Pt will maintain dynamic standing balance during mobility activities with:: Independent   Problem: Sit to Stand Goal: LTG:  Patient will perform sit to stand with assistance level (PT) Description: LTG:  Patient will perform sit to stand with assistance level (PT) Outcome: Completed/Met Flowsheets (Taken 08/26/2021 1703) LTG: PT will perform sit to stand in preparation for functional mobility with assistance level: Independent   Problem: RH Bed Mobility Goal: LTG Patient will perform bed mobility with assist (PT) Description: LTG: Patient will perform bed mobility with assistance, with/without cues (PT). Outcome: Completed/Met Flowsheets (Taken 08/26/2021 1703) LTG: Pt will perform bed mobility with assistance level of: Independent   Problem: RH Bed to Chair Transfers Goal: LTG Patient will perform bed/chair transfers w/assist (PT) Description: LTG: Patient will perform bed to chair transfers with assistance (PT). Outcome: Completed/Met Flowsheets (Taken 08/26/2021 1703) LTG: Pt will perform Bed to Chair Transfers with assistance level: Independent   Problem: RH Car Transfers Goal: LTG Patient will perform car transfers with assist (PT) Description: LTG: Patient will perform car transfers with assistance (PT). Outcome: Completed/Met Flowsheets (Taken 08/21/2021 1254 by Tawana Scale, PT) LTG: Pt will perform car  transfers with assist:: Supervision/Verbal cueing   Problem: RH Ambulation Goal: LTG Patient will ambulate in controlled environment (PT) Description: LTG: Patient will ambulate in a controlled environment, # of feet with assistance (PT). Outcome: Completed/Met Flowsheets (Taken 08/26/2021 1703) LTG: Pt will ambulate in controlled environ  assist needed:: Independent LTG: Ambulation distance in controlled environment: 800 ft with no AD Goal: LTG Patient will ambulate in home environment (PT) Description: LTG: Patient will ambulate in home environment, # of feet with assistance (PT). Outcome: Completed/Met Flowsheets Taken 08/26/2021 1703 by Alger Simons, PT LTG: Pt will ambulate in home environ  assist needed:: Independent Taken 08/21/2021 1254 by Pippin, Kara Pacer, PT LTG: Ambulation distance in home environment: 37f using LRAD   Problem: RH Stairs Goal: LTG Patient will ambulate up and down stairs w/assist (PT) Description: LTG: Patient will ambulate up and down # of stairs with assistance (PT) Outcome: Completed/Met Flowsheets (Taken 08/26/2021 1703) LTG: Pt will ambulate up/down stairs assist needed:: Independent with assistive device LTG: Pt will  ambulate up and down number of stairs: 12 steps with RHR

## 2021-08-26 NOTE — Progress Notes (Addendum)
Occupational Therapy Discharge Summary  Patient Details  Name: [name] [name] MRN: [id number] Date of Birth: 01/15/1961  Today's Date: 08/26/2021 OT Individual Time: 8588-5027 OT Individual Time Calculation (min): 87 min    Patient has met 13 of 13 long term goals due to improved activity tolerance, improved balance, postural control, ability to compensate for deficits, improved attention, improved awareness, and improved coordination.  Patient to discharge at overall Independent level.  Patient's care partner unavailable to provide the necessary physical and cognitive assistance at discharge.  Pt to DC at the below ADL/bathroom transfer performance level. Assist levels represent pt's most consistent performance when alert/participatory. Pt continues to be primarily limited by baseline orthostatic hypotension. Pt will continue to benefit from OP OT to facilitate improved functional mobility and occupational performance.    Reasons goals not met: NA  Recommendation:  Patient will benefit from ongoing skilled OT services in outpatient setting to continue to advance functional skills in the area of BADL, iADL, and Vocation.  Equipment: No equipment provided  Reasons for discharge: treatment goals met and discharge from hospital  Patient/family agrees with progress made and goals achieved: Yes  OT Discharge Precautions/Restrictions  Precautions Precautions: Fall;Other (comment) Precaution Comments: blurring of vision, autonomic dysfunction orthostasis Restrictions Weight Bearing Restrictions: No  Pain Pain Assessment Pain Scale: 0-10 Pain Score: 0-No pain ADL ADL Eating: Independent Where Assessed-Eating: Chair Grooming: Independent Where Assessed-Grooming: Standing at sink Upper Body Bathing: Modified independent Where Assessed-Upper Body Bathing: Shower, Sitting at sink Lower Body Bathing: Modified independent Where Assessed-Lower Body Bathing: Standing at sink, Shower Upper  Body Dressing: Independent Where Assessed-Upper Body Dressing: Sitting at sink Lower Body Dressing: Independent Where Assessed-Lower Body Dressing: Standing at sink Toileting: Independent Where Assessed-Toileting: Glass blower/designer: Programmer, applications Method: Counselling psychologist: Energy manager: Not assessed Gaffer Transfer: IT consultant Method: Heritage manager: Gaffer Baseline Vision/History: 1 Wears glasses (for reading) Patient Visual Report: Blurring of vision;Other (comment) (reports it feels as if "something is over her R eye") Vision Assessment?: Yes Eye Alignment: Within Functional Limits Ocular Range of Motion: Within Functional Limits Alignment/Gaze Preference: Within Defined Limits Tracking/Visual Pursuits: Able to track stimulus in all quads without difficulty Saccades: Additional head turns occurred during testing;Decreased speed of saccadic movement Convergence: Within functional limits Visual Fields: Right visual field deficit Additional Comments: Required increased time to locate targets on R periphery, question field cut vs baseline decreased acuity Perception  Perception: Within Functional Limits Praxis Praxis: Intact Cognition Overall Cognitive Status: Within Functional Limits for tasks assessed Arousal/Alertness: Awake/alert Orientation Level: Oriented X4 Year: 2023 Month: February Day of Week: Correct Attention: Alternating Selective Attention: Appears intact Alternating Attention: Appears intact Memory: Appears intact Immediate Memory Recall: Sock;Blue;Bed Memory Recall Sock: Without Cue Memory Recall Blue: Without Cue Memory Recall Bed: Without Cue Problem Solving: Appears intact Safety/Judgment: Appears intact Sensation Sensation Light Touch: Impaired Detail Peripheral sensation comments: hx of peripheral neuropathy in B   LEs Light Touch Impaired Details: Impaired LLE;Impaired RLE Hot/Cold: Appears Intact Proprioception: Appears Intact Stereognosis: Not tested Coordination Gross Motor Movements are Fluid and Coordinated: Yes Fine Motor Movements are Fluid and Coordinated: Yes Coordination and Movement Description: BUE coordination Artesian for ADL tasks. Finger Nose Finger Test: [facility] Motor  Motor Motor: Other (comment) Motor - Discharge Observations: Generalized weakness and deconditioning, improved from eval. Mobility  Bed Mobility Bed Mobility: Not assessed Transfers Sit to Stand: Independent Stand to Sit: Independent  Trunk/Postural Assessment  Cervical Assessment Cervical Assessment: Within Functional Limits Thoracic Assessment Thoracic Assessment: Exceptions to [facility] (slight rounding noted but minimal) Lumbar Assessment Lumbar Assessment: Within Functional Limits Postural Control Postural Control: Within Functional Limits  Balance Balance Balance Assessed: Yes Static Sitting Balance Static Sitting - Balance Support: Feet supported Static Sitting - Level of Assistance: 7: Independent Dynamic Sitting Balance Dynamic Sitting - Balance Support: During functional activity;Feet supported Dynamic Sitting - Level of Assistance: 7: Independent Static Standing Balance Static Standing - Balance Support: No upper extremity supported;During functional activity Static Standing - Level of Assistance: 7: Independent Dynamic Standing Balance Dynamic Standing - Balance Support: During functional activity;No upper extremity supported Extremity/Trunk Assessment RUE Assessment RUE Assessment: Within Functional Limits General Strength Comments: strength 4/5 throughout LUE Assessment LUE Assessment: Within Functional Limits General Strength Comments: Strength 4/5 throughout  Session Note: Pt received seated in recliner on phone with daughter, no c/o pain, agreeable to therapy. Session focus on self-care  retraining, activity tolerance, transfer retraining, dynamic standing balance, R visual scanning, IADL retraining in prep for improved ADL/IADL/func mobility performance + decreased caregiver burden. Relates excitement for going home tomorrow. Independently doffed B socks, in standing, BP read at 90/58 (64), no teds donned. Denies any s/sx.   Completed the following tasks with independence: ambulatory shower transfer with no AD, bathed full-body while seated with mod I. Completed UB dressing tasks with independence, seated oral care mod I, and donned LB clothing with independence. RN in to administer medication.   Pt able to gather personal items from various height surfaces and organize in prep for DC home tomorrow with independence. Ambulated > gym > atrium with distant S due to long distance and hx of OH.  Completed DC reassessments as documented above. Noted potential R field cut deficits vs decreased acuity due baseline visual deficits. At [facility] completed the following games to further assess peripheral vision:  -Bell Cancellation Task: 2:50 min, 1 miss on R side but demonstrates organized visual scanning  -Single User Target: 100% accuracy, 1.63 reaction time, 73 hits  -Noted to take slightly increased time to locate targets on R, but no glaring visual field deficits impairing function.   In hospital gift shop, pt able to locate 5/5 shopping list items with overall min A to locate 2 items + increased time. Required several repetitions of verbal list to recall items. Discussed organization strategies (shopping list, organized search pattern, going with assist, etc.) to facilitate shopping performance.    Pt left seated in recliner with chair pad alarm engaged, call bell in reach, and all immediate needs met.    Volanda Napoleon MS, OTR/L  08/26/2021, 12:04 PM

## 2021-08-26 NOTE — Progress Notes (Addendum)
Patient ID: [name] [name], female   DOB: [birth year], 61 y.o.   MRN: [id number]  This SW covering for primary SW, Erlene Quan.   Notes by therapy indicate there are concerns related to transportation. Pt has [place] [employer] Medicaid. Insurance offers transportation- [facility] [employer] [phone]; required to call 3 business days in advance to schedule any future medical appointments.   SW met with pt in room to review discharge recommendations- outpatient therapies and 3in1 BSC. Pt prefers [facility] for PT/OT. Pt does not want a 3in1 BSC as she states her bathroom is really small and everything is close together. When discussing her transportation to appointments, SW informed on above with regard to transportation services through her insurance. She is unsure on how she will get home tomorrow. She reports that she will ask her dtr when she comes by today. She is aware SW will f/u with her dtr [name] [name].  SW spoke with her dtr [name] [name] [phone]) to discuss above. She reports that she will be there to pick her up tomorrow, and unable to come in today due to work schedule. Confirms she has a roommate that she lives with. SW informed about transportation that is offered through her insurance to get to outpatient therapies. She reports she will come to pick her up during her lunch break tomorrow and would like to be present during discharge instructions.   SW faxed outpatient PT/OT referral to [facility] (p:[phone]/f:[phone]).  Loralee Pacas, MSW, [place] Office: [phone] Cell: [phone] Fax: [phone]

## 2021-08-27 LAB — GLUCOSE, CAPILLARY
Glucose-Capillary: 51 mg/dL — ABNORMAL LOW (ref 70–99)
Glucose-Capillary: 82 mg/dL (ref 70–99)
Glucose-Capillary: 198 mg/dL — ABNORMAL HIGH (ref 70–99)

## 2021-08-27 MED ORDER — ATORVASTATIN CALCIUM 40 MG PO TABS: 40.0000 mg | ORAL_TABLET | Freq: Every day | ORAL | 0 refills | Status: DC

## 2021-08-27 MED ORDER — HYDROCORTISONE 20 MG PO TABS: 20.0000 mg | ORAL_TABLET | Freq: Every day | ORAL | 0 refills | Status: DC

## 2021-08-27 MED ORDER — CLOPIDOGREL BISULFATE 75 MG PO TABS: 75.0000 mg | ORAL_TABLET | Freq: Every day | ORAL | 0 refills | Status: AC

## 2021-08-27 MED ORDER — ACETAMINOPHEN 325 MG PO TABS
325.0000 mg | ORAL_TABLET | ORAL | Status: AC | PRN
Start: 2021-08-27 — End: ?

## 2021-08-27 MED ORDER — ASPIRIN 81 MG PO TBEC: 81.0000 mg | DELAYED_RELEASE_TABLET | Freq: Every day | ORAL | 0 refills | Status: AC

## 2021-08-27 MED ORDER — CALCIUM POLYCARBOPHIL 625 MG PO TABS: 625.0000 mg | ORAL_TABLET | Freq: Every day | ORAL | 1 refills | Status: DC

## 2021-08-27 MED ORDER — CLOPIDOGREL BISULFATE 75 MG PO TABS: 75.0000 mg | ORAL_TABLET | Freq: Every day | ORAL | 0 refills | Status: DC

## 2021-08-27 MED ORDER — MIDODRINE HCL 10 MG PO TABS: 10.0000 mg | ORAL_TABLET | Freq: Three times a day (TID) | ORAL | 0 refills | Status: DC

## 2021-08-27 NOTE — Progress Notes (Signed)
Patient appears to be resting comfortable in no acute distress, respiration even and unlabored on RA, coloration adequate. Anticipated discharge home today

## 2021-08-27 NOTE — Progress Notes (Signed)
Inpatient Rehabilitation Care Coordinator Discharge Note   Patient Details  Name: [name] [name] MRN: [id number] Date of Birth: 1960/11/17   Discharge location: Home  Length of Stay: 7 days  Discharge activity level: mod i  Home/community participation: daughter and roomate  Patient response YB:FXOVAN Literacy - How often do you need to have someone help you when you read instructions, pamphlets, or other written material from your doctor or pharmacy?: Rarely  Patient response VB:TYOMAY Isolation - How often do you feel lonely or isolated from those around you?: Never  Services provided included: SW, Pharmacy, TR, CM, RN, SLP, OT, PT, RD, MD  Financial Services:  Financial Services Utilized: Medicare    Choices offered to/list presented to: patient  Follow-up services arranged:  Outpatient    Outpatient Servicies: neuro rehab      Patient response to transportation need: Is the patient able to respond to transportation needs?: Yes In the past 12 months, has lack of transportation kept you from medical appointments or from getting medications?: No In the past 12 months, has lack of transportation kept you from meetings, work, or from getting things needed for daily living?: No    Comments (or additional information):  Patient/Family verbalized understanding of follow-up arrangements:  Yes  Individual responsible for coordination of the follow-up plan: patient or daughter  Confirmed correct DME delivered: Dyanne Iha 08/27/2021    Dyanne Iha

## 2021-08-27 NOTE — Progress Notes (Signed)
Inpatient Rehabilitation Discharge Medication Review by a Pharmacist  A complete drug regimen review was completed for this patient to identify any potential clinically significant medication issues.  High Risk Drug Classes Is patient taking? Indication by Medication  Antipsychotic No   Anticoagulant No   Antibiotic No   Opioid No   Antiplatelet Yes ASA and Plavix for CVA Prophx.  Hypoglycemics/insulin Yes SSI, meal coverage, Lantus/Semglee for DM  Vasoactive Medication Yes Midodrine for orthostasis  Chemotherapy No   Other Yes Hydrocortisone tabs for adrenal insuff and orthostasis     Clinically significant medication issues were identified that warrant physician communication and completion of prescribed/recommended actions by midnight of the next day:  No   Time spent performing this drug regimen review (minutes):  10 min  Bilaal Leib S. Alford Highland, PharmD, BCPS Clinical Staff Pharmacist [email or web address]  Wayland Salinas 08/27/2021 8:02 AM

## 2021-08-27 NOTE — Progress Notes (Signed)
Patient up at sink during rounds w/o distress or complaint, consumed all of breakfast,States she is unaware why her glucose drops like it does, has had this happen several times while in the hospital.Monitor and assisted, safety measures continue, continue regime

## 2021-08-27 NOTE — Progress Notes (Signed)
Hypoglycemic Event  CBG:51  Treatment: 8 oz juice/soda  Symptoms: None  Follow-up CBG: AUEB:[id number] CBG Result:82  Possible Reasons for Event: Unknown  Comments/MD notified 0640  notify PA    Belva Chimes

## 2021-08-27 NOTE — Progress Notes (Signed)
PROGRESS NOTE   Subjective/Complaints: No new complaints this morning Stable for d/c today Discussed improvements in blood pressure  ROS denies CP, SOB, N/V/D, +orthostatic hypotension- improved  Objective:   No results found. Recent Labs    08/25/21 0531  WBC 5.3  HGB 11.8*  HCT 36.0  PLT 204   Recent Labs    08/25/21 0531  NA 141  K 3.7  CL 105  CO2 28  GLUCOSE 121*  BUN 12  CREATININE 0.64  CALCIUM 8.8*    Intake/Output Summary (Last 24 hours) at 08/27/2021 1155 Last data filed at 08/26/2021 1908 Gross per 24 hour  Intake 240 ml  Output --  Net 240 ml        Physical Exam: Vital Signs Blood pressure 120/80, pulse (!) 102, temperature 97.7 F (36.5 C), resp. rate 17, height 5\' 6"  (1.676 m), weight 66.6 kg, SpO2 97 %.  Gen: no distress, normal appearing HEENT: oral mucosa pink and moist, NCAT Cardio: Tachycardia Chest: normal effort, normal rate of breathing Abd: soft, non-distended Ext: no edema Psych: pleasant, normal affect Skin: intact  Neurologic: Cranial nerves II through XII intact, motor strength is 5/5 in bilateral deltoid, bicep, tricep, grip, hip flexor, knee extensors, ankle dorsiflexor and plantar flexor  Musculoskeletal: Full range of motion in all 4 extremities. No joint swelling   Assessment/Plan: 1. Functional deficits which require 3+ hours per day of interdisciplinary therapy in a comprehensive inpatient rehab setting. Physiatrist is providing close team supervision and 24 hour management of active medical problems listed below. Physiatrist and rehab team continue to assess barriers to discharge/monitor patient progress toward functional and medical goals  Care Tool:  Bathing    Body parts bathed by patient: Right arm, Left arm, Chest, Abdomen, Front perineal area, Buttocks, Right upper leg, Left upper leg, Face, Left lower leg, Right lower leg         Bathing assist  Assist Level: Independent with assistive device Assistive Device Comment: TTB   Upper Body Dressing/Undressing Upper body dressing   What is the patient wearing?: Pull over shirt    Upper body assist Assist Level: Independent    Lower Body Dressing/Undressing Lower body dressing      What is the patient wearing?: Pants, Underwear/pull up     Lower body assist Assist for lower body dressing: Independent     Toileting Toileting    Toileting assist Assist for toileting: Independent     Transfers Chair/bed transfer  Transfers assist     Chair/bed transfer assist level: Independent Chair/bed transfer assistive device: Research officer, political party   Ambulation assist      Assist level: Independent Assistive device: No Device Max distance: 800 ft   Walk 10 feet activity   Assist     Assist level: Independent Assistive device: No Device   Walk 50 feet activity   Assist    Assist level: Independent Assistive device: No Device    Walk 150 feet activity   Assist Walk 150 feet activity did not occur: Safety/medical concerns  Assist level: Independent Assistive device: No Device    Walk 10 feet on uneven surface  activity   Assist  Assist level: Supervision/Verbal cueing Assistive device: Other (comment) (no device)   Wheelchair     Assist Is the patient using a wheelchair?: No Type of Wheelchair: Manual    Wheelchair assist level: Supervision/Verbal cueing Max wheelchair distance: 158ft    Wheelchair 50 feet with 2 turns activity    Assist        Assist Level: Supervision/Verbal cueing   Wheelchair 150 feet activity     Assist      Assist Level: Supervision/Verbal cueing   Blood pressure 120/80, pulse (!) 102, temperature 97.7 F (36.5 C), resp. rate 17, height 5\' 6"  (1.676 m), weight 66.6 kg, SpO2 97 %.  Medical Problem List and Plan: 1. Functional deficits secondary to left caudate tail infarct.has a R  HH             -patient may shower             -ELOS/Goals: 7-10 days S- may be ready 2/8 if HH can be set up and BPs remain up            d/c home today 2.  Antithrombotics: -DVT/anticoagulation:  Pharmaceutical: Lovenox             -antiplatelet therapy: DAPT X 3 weeks followed by ASA alone on  09/08/21 3. Chronic pain/myalgias/Pain Management: Continue cymbalta 60 mg bid w/ gabapentin 600/600/1200             --will continue to hold Tizanidine.  4. Mood: LCSW to follow for evaluation and support.              -antipsychotic agents: N/A 5. Neuropsych: This patient is capable of making decisions on her own behalf. 6. Skin/Wound Care: Routine pressure relief measures.  7. Fluids/Electrolytes/Nutrition: Monitor I/O. Check lytes in am.  8. RLL PNA: Treated with ceftriaxone-->Omnicef 3 additional days to complete 5 day course antibiotic regimen             --h/o RLL PNA 11/22  9. Chronic Adrenal insufficiency: now back on hydrocortisone 10 mg daily, given ongoing orthostasis will increase to 20mg   10. T2DM with neuropathy/autonomic dysfunction: Hgb A1C-13.3 and poorly controlled but improved from >15.5 at last admission. Continue to educate on compliance.               --continue Insulin glargine 55 units with novolog 6 units TID for meal coverage.                --Monitor BS ac/hs. Continue SSI for elevated BS. Continue to titrate insulin for tighter BS control.   CBG (last 3)  Recent Labs    08/26/21 2059 08/27/21 0555 08/27/21 0630  GLUCAP 187* 51* 82  Improved goal is 110-180  2/4: decrease novolog to 6U  11. 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             --will order orthostatic vitals.THigh hi teds monitor fluid intake              --continue Midodrine 10 mg TID for BP support.    Vitals:   08/26/21 1945 08/27/21 0445  BP: (!) 150/71 120/80  Pulse: (!) 109 (!) 102  Resp: 18 17  Temp: 98  F (36.7 C) 97.7 F (36.5 C)  SpO2: 99% 97%  Stabilized on higher dose of hydrocortisone, cont to monitor given systolic BP in 50Y with standing on 2/5 , if this remains stable through 2/8  should be able to d/c from a medical standpoint   2/7: discussed she is clear to d/c tomorrow as Bps have been relatively stable   12. Elevated lipids: continue lipitor.  13. Chapped lips: ordered Blistex. 14. Periodontal disease: discussed her future plan for tooth removal and dentures. Discussed swishing coconut oil in mouth when she returns home for its antibacterial benefits.   15.  Visual blurring, has some lens opacity likely cataracts , given DM may also have retinopathy , will need optho visit as OP ( has not seen per pt report )- asked Pam to schedule.  16.  Freq loose stools , improved off omnicef , qd BM  17. Tachycardia: continue to monitor outpatient,    >30 minutes spent in discharge of patient including review of medications and follow-up appointments, physical examination, and in answering all patient's questions  LOS: 7 days A FACE TO FACE EVALUATION WAS Wirt 08/27/2021, 11:55 AM

## 2021-08-28 ENCOUNTER — Telehealth: Payer: Self-pay

## 2021-08-28 NOTE — Telephone Encounter (Signed)
Transition Care Management Unsuccessful Follow-up Telephone Call  Date of discharge and from where:  08/27/2021, [facility] - inpatient rehab  Attempts:  1st Attempt  Reason for unsuccessful TCM follow-up call:  Left voice message on # [phone]. Call back requested.  This patient was on my initial list for [facility] calls with Dr Margarita Rana @ [facility] as her PCP. However, it is noted now that the patient has a new PCP assigned

## 2021-09-21 ENCOUNTER — Other Ambulatory Visit: Payer: Self-pay | Admitting: Physical Medicine and Rehabilitation

## 2021-09-21 DIAGNOSIS — I951 Orthostatic hypotension: Secondary | ICD-10-CM

## 2021-10-03 ENCOUNTER — Encounter: Payer: Medicare Other | Attending: Physical Medicine & Rehabilitation | Admitting: Physical Medicine & Rehabilitation

## 2021-10-06 ENCOUNTER — Ambulatory Visit: Payer: Medicare Other | Admitting: Occupational Therapy

## 2021-10-06 ENCOUNTER — Ambulatory Visit: Payer: Medicare Other

## 2021-10-08 ENCOUNTER — Other Ambulatory Visit: Payer: Self-pay | Admitting: Family Medicine

## 2021-10-08 DIAGNOSIS — R7989 Other specified abnormal findings of blood chemistry: Secondary | ICD-10-CM

## 2021-10-20 ENCOUNTER — Telehealth: Payer: Self-pay | Admitting: Neurology

## 2021-10-20 ENCOUNTER — Encounter: Payer: Self-pay | Admitting: Neurology

## 2021-10-20 ENCOUNTER — Ambulatory Visit (INDEPENDENT_AMBULATORY_CARE_PROVIDER_SITE_OTHER): Payer: Medicare Other | Admitting: Neurology

## 2021-10-20 VITALS — BP 115/78 | HR 111 | Ht 66.0 in | Wt 144.0 lb

## 2021-10-20 DIAGNOSIS — I6300 Cerebral infarction due to thrombosis of unspecified precerebral artery: Secondary | ICD-10-CM | POA: Diagnosis not present

## 2021-10-20 DIAGNOSIS — D136 Benign neoplasm of pancreas: Secondary | ICD-10-CM | POA: Insufficient documentation

## 2021-10-20 DIAGNOSIS — H538 Other visual disturbances: Secondary | ICD-10-CM | POA: Diagnosis not present

## 2021-10-20 DIAGNOSIS — I639 Cerebral infarction, unspecified: Secondary | ICD-10-CM

## 2021-10-20 DIAGNOSIS — R42 Dizziness and giddiness: Secondary | ICD-10-CM | POA: Diagnosis not present

## 2021-10-20 DIAGNOSIS — R7989 Other specified abnormal findings of blood chemistry: Secondary | ICD-10-CM | POA: Insufficient documentation

## 2021-10-20 DIAGNOSIS — E1140 Type 2 diabetes mellitus with diabetic neuropathy, unspecified: Secondary | ICD-10-CM | POA: Insufficient documentation

## 2021-10-20 MED ORDER — PYRIDOSTIGMINE BROMIDE 60 MG PO TABS: 60.0000 mg | ORAL_TABLET | Freq: Three times a day (TID) | ORAL | 11 refills | Status: DC

## 2021-10-20 NOTE — Progress Notes (Signed)
? ?Chief Complaint  ?Patient presents with  ? Hospitalization Follow-up  ?  Rm 14. ?Hospital f/u/Saw Dr. Leonie Man. ?C/o difficulty with right eye focusing, c/o bilateral hand weakness, bilateral leg weakness.  ? ? ? ? ?ASSESSMENT AND PLAN ? ?[name] [name] is a 61 y.o. female   ?Left basal ganglion stroke ? Multiple vascular risk factors,  hyperlipidemia, poorly controlled diabetes, persistent elevated A1c up to 12 with most recent being 15 since 2020 ? ? She was started on aspirin since hospital discharge in February 2023 ? CT angiogram of head and neck showed no significant large vessel disease ? Echocardiogram on August 17, 2021 showed normal ejection fraction no significant valvular disease ? Emphasized importance of optimize diabetes control ? ?Diabetes, diabetic peripheral neuropathy, autonomic dysfunction ? She has a low normal range blood pressure, tachycardia at baseline, worsening tachycardia, further decrease of blood pressure with standing position, ? She was discharged with midodrine 10 mg 3 times a day since hospital discharge in February 2023 without significant improvement, she has run out of prescription now, ? I doubt midodrine is a good option with her stroke, tachycardia will try Mestinon 60 mg 3 times a day ? Encouraged her to increase water intake.  ? ?DIAGNOSTIC DATA (LABS, IMAGING, TESTING) ?- I reviewed patient records, labs, notes, testing and imaging myself where available. ? ? ?MEDICAL HISTORY: ? ?[name] [name] is a 61 year old female, seen in request by her primary care physician Dr. London Pepper, for hospital follow-up of stroke, initial office visit was on October 20, 2021. ? ?I reviewed and summarized the referring note.  Past medical history ?Type 2 diabetes with peripheral neuropathy, insulin dependent ?HLD ?Adrenal insufficiency on hydrocortisone 31m daily ?Autonomic dysfunction with orthostatic, was put on midodrinin 16mtid since Feb 2023 ? ?Patient was brought in by  transportation, alone at today's visit,  ? ?She was admitted to the hospital on August 16, 2021 for 4 days history of blurry vision, right lower lobe pneumonia treated with antibiotics,for severe depression, orthostatic hypotension secondary to autonomic failure, uncontrolled diabetes, A1c up to 15, stroke involving left caudate tail ? ?I personally reviewed MRI of the brain on August 16, 2021, acute stroke involving left caudate tail, periventricular small vessel disease ? ?CT angiogram of head and neck showed no large vessel disease ? ?Laboratory evaluations in February 2023: Hemoglobin 11.8, BMP calcium was mildly decreased 8.8, glucose 121, creatinine 0.64, UDS was positive for marijuana ?Lipid panel LDL 106, A1c 13.3, previously was up to 15.24 May 2021, has been persistently around 12 or above since 2020 ? 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 ?She reported frequent dizziness, lack of stamina, today's blood pressure was low normal range with significant tachycardia, ? ?She was discharged with midodrine 10 mg 3 times daily, she reported no significant improvement, has run out of her medications ? ?She was not on any antiplatelet agent treatment, now on aspirin 81 mg daily ? ?PHYSICAL EXAM: ?  ?Vitals:  ? 10/20/21 1328  ?BP: 115/78  ?Pulse: (!) 111  ?Weight: 144 lb (65.3 kg)  ?Height: _0  (1.676 m)  ?Sitting down 93/61, 108; standing up 80/51, one minute 115; 92/61, 116 ? ?Body mass index is 23.24 kg/m?. ? ?PHYSICAL EXAMNIATION: ? ?Gen: NAD, conversant, well nourised, well groomed                     ?  Cardiovascular: Regular rate rhythm, no peripheral edema, warm, nontender. ?Eyes: Conjunctivae clear without exudates or hemorrhage ?Neck: Supple, no carotid bruits. ?Pulmonary: Clear to auscultation  bilaterally  ? ?NEUROLOGICAL EXAM: ? ?MENTAL STATUS: ?Speech/cognition: ?Awake, alert, oriented to history taking, and casual conversation, depressed looking, poor historian ?  ?CRANIAL NERVES: ?CN II: Visual fields are full to confrontation. OD 20/200, OS 20/200, Pupils are round equal and reactive to light. ?CN III, IV, VI: extraocular movement are normal. No ptosis. ?CN V: Facial sensation is intact to light touch ?CN VII: Face is symmetric with normal eye closure  ?CN VIII: Hearing is normal to causal conversation. ?CN IX, X: Phonation is normal. ?CN XI: Head turning and shoulder shrug are intact ? ?MOTOR: moderate toes extension/flexion weakness ? ?REFLEXES: ?Reflexes are absent ? ?SENSORY: ?Length dependent decreased to light touch, vibratory sensation to below knee level, decreased fingertips vibratory sensation. ? ?COORDINATION: ?There is no trunk or limb dysmetria noted. ? ?GAIT/STANCE: Need push-up to get up from sitting position, cautious, unsteady, could not do tandem, tiptoe walking, positive Romberg signs ? ?REVIEW OF SYSTEMS:  ?Full 14 system review of systems performed and notable only for as above ?All other review of systems were negative. ? ? ?ALLERGIES: ?Allergies  ?Allergen Reactions  ? Tramadol Nausea And Vomiting  ?  REACTION: Projectile vomiting  ? Tramadol Hcl   ?  Other reaction(s): vomiting  ? ? ?HOME MEDICATIONS: ?Current Outpatient Medications  ?Medication Sig Dispense Refill  ? Accu-Chek Softclix Lancets lancets Use as instructed tid before meals 100 each 12  ? acetaminophen (TYLENOL) 325 MG tablet Take 1-2 tablets (325-650 mg total) by mouth every 4 (four) hours as needed for mild pain.    ? aspirin 81 MG EC tablet Take 1 tablet (81 mg total) by mouth daily. Swallow whole. 100 tablet 0  ? atorvastatin (LIPITOR) 40 MG tablet TAKE 1 TABLET(40 MG) BY MOUTH DAILY 30 tablet 0  ? Blood Glucose Monitoring Suppl (ACCU-CHEK GUIDE) w/Device KIT Use as directed tid 1 kit 0  ? DULoxetine  (CYMBALTA) 30 MG capsule 1 capsule    ? gabapentin (NEURONTIN) 600 MG tablet TAKE ONE TABLET (600 MG) BY MOUTH EVERY MORNING AND AFTERNOON, TAKE TWO TABLETS AT NIGHT (Patient taking differently: Take 600 mg by mouth See admin instructions. TAKE ONE TABLET (600 MG) BY MOUTH EVERY MORNING AND AFTERNOON, TAKE TWO TABLETS AT NIGHT) 120 tablet 3  ? glucose blood (ACCU-CHEK GUIDE) test strip Use as instructed tid 100 each 12  ? hydrocortisone (CORTEF) 20 MG tablet TAKE 1 TABLET(20 MG) BY MOUTH DAILY 30 tablet 0  ? insulin aspart (NOVOLOG) 100 UNIT/ML injection Inject 6 Units into the skin 3 (three) times daily with meals. 10 mL 11  ? insulin glargine (LANTUS SOLOSTAR) 100 UNIT/ML Solostar Pen 22 units    ? insulin glargine-yfgn (SEMGLEE) 100 UNIT/ML injection Inject 0.55 mLs (55 Units total) into the skin at bedtime. 10 mL 11  ? Insulin Pen Needle (PEN NEEDLES) 31G X 8 MM MISC Use as directed 100 each 6  ? lamoTRIgine (LAMICTAL) 25 MG tablet 1 tablet    ? midodrine (PROAMATINE) 10 MG tablet TAKE 1 TABLET(10 MG) BY MOUTH THREE TIMES DAILY WITH MEALS 90 tablet 0  ? polycarbophil (FIBERCON) 625 MG tablet Take 1 tablet (625 mg total) by mouth daily. 30 tablet 1  ? polyethylene glycol (MIRALAX / GLYCOLAX) 17 g packet Take 17 g by mouth daily as needed for mild constipation. 14 each 0  ? DULoxetine (  CYMBALTA) 60 MG capsule Take 1 capsule (60 mg total) by mouth 2 (two) times daily. (Patient not taking: Reported on 10/20/2021) 60 capsule 3  ? ?No current facility-administered medications for this visit.  ? ? ?PAST MEDICAL HISTORY: ?Past Medical History:  ?Diagnosis Date  ? Cervical disc disorder with radiculopathy of cervical region   ? Chronic pain syndrome   ? Degenerative joint disease   ? Depression dx 1997  ? Diabetes mellitus, type 2 ([place]) 2011  ? No insulin  ? Diabetic polyneuropathy ([place])   ? GERD (gastroesophageal reflux disease)   ? Glaucoma   ? Headache(784.0)   ? Hypertension   ? Lab 11/2011:  CXR, EKG, CBC, TSH, BMet,  troponin-normal; lipid profile: 188, 131, 36, 126   ? Lumbar herniated disc   ? Non-compliance   ? Pancreatic cyst   ? Endoscopic aspiration in 09/2009  ? Pneumonia 08/02/2012  ? Shingles   ? 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

## 2021-10-20 NOTE — Telephone Encounter (Signed)
Referral sent to [facility] [phone]. ?

## 2021-10-21 ENCOUNTER — Other Ambulatory Visit: Payer: Self-pay | Admitting: Physical Medicine & Rehabilitation

## 2021-10-21 DIAGNOSIS — I951 Orthostatic hypotension: Secondary | ICD-10-CM

## 2021-11-13 ENCOUNTER — Observation Stay (HOSPITAL_COMMUNITY)
Admission: EM | Admit: 2021-11-13 | Discharge: 2021-11-14 | Disposition: A | Discharge status: Home / Self Care | Payer: Medicare Other | Attending: Family Medicine | Admitting: Family Medicine

## 2021-11-13 ENCOUNTER — Other Ambulatory Visit: Payer: Self-pay

## 2021-11-13 ENCOUNTER — Observation Stay (HOSPITAL_COMMUNITY): Payer: Medicare Other

## 2021-11-13 ENCOUNTER — Encounter (HOSPITAL_COMMUNITY): Payer: Self-pay | Admitting: *Deleted

## 2021-11-13 DIAGNOSIS — F322 Major depressive disorder, single episode, severe without psychotic features: Secondary | ICD-10-CM | POA: Insufficient documentation

## 2021-11-13 DIAGNOSIS — R197 Diarrhea, unspecified: Secondary | ICD-10-CM | POA: Diagnosis not present

## 2021-11-13 DIAGNOSIS — I639 Cerebral infarction, unspecified: Secondary | ICD-10-CM | POA: Diagnosis present

## 2021-11-13 DIAGNOSIS — G908 Other disorders of autonomic nervous system: Secondary | ICD-10-CM | POA: Diagnosis not present

## 2021-11-13 DIAGNOSIS — Z7982 Long term (current) use of aspirin: Secondary | ICD-10-CM | POA: Diagnosis not present

## 2021-11-13 DIAGNOSIS — Z87891 Personal history of nicotine dependence: Secondary | ICD-10-CM | POA: Diagnosis not present

## 2021-11-13 DIAGNOSIS — R000 Tachycardia, unspecified: Secondary | ICD-10-CM | POA: Diagnosis not present

## 2021-11-13 DIAGNOSIS — Z79899 Other long term (current) drug therapy: Secondary | ICD-10-CM | POA: Diagnosis not present

## 2021-11-13 DIAGNOSIS — Z794 Long term (current) use of insulin: Secondary | ICD-10-CM | POA: Insufficient documentation

## 2021-11-13 DIAGNOSIS — E2740 Unspecified adrenocortical insufficiency: Secondary | ICD-10-CM | POA: Diagnosis present

## 2021-11-13 DIAGNOSIS — E1142 Type 2 diabetes mellitus with diabetic polyneuropathy: Secondary | ICD-10-CM | POA: Diagnosis not present

## 2021-11-13 DIAGNOSIS — E1165 Type 2 diabetes mellitus with hyperglycemia: Secondary | ICD-10-CM | POA: Diagnosis not present

## 2021-11-13 DIAGNOSIS — I951 Orthostatic hypotension: Secondary | ICD-10-CM | POA: Diagnosis present

## 2021-11-13 DIAGNOSIS — Z8673 Personal history of transient ischemic attack (TIA), and cerebral infarction without residual deficits: Secondary | ICD-10-CM | POA: Insufficient documentation

## 2021-11-13 DIAGNOSIS — R55 Syncope and collapse: Secondary | ICD-10-CM | POA: Diagnosis present

## 2021-11-13 DIAGNOSIS — G894 Chronic pain syndrome: Secondary | ICD-10-CM | POA: Diagnosis present

## 2021-11-13 DIAGNOSIS — I10 Essential (primary) hypertension: Secondary | ICD-10-CM | POA: Diagnosis not present

## 2021-11-13 DIAGNOSIS — G909 Disorder of the autonomic nervous system, unspecified: Secondary | ICD-10-CM | POA: Diagnosis present

## 2021-11-13 LAB — CBG MONITORING, ED
Glucose-Capillary: 195 mg/dL — ABNORMAL HIGH (ref 70–99)
Glucose-Capillary: 181 mg/dL — ABNORMAL HIGH (ref 70–99)

## 2021-11-13 LAB — CBC WITH DIFFERENTIAL/PLATELET
Abs Immature Granulocytes: 0.02 10*3/uL (ref 0.00–0.07)
Basophils Absolute: 0 10*3/uL (ref 0.0–0.1)
Basophils Relative: 0 %
Eosinophils Absolute: 0.3 10*3/uL (ref 0.0–0.5)
Eosinophils Relative: 7 %
HCT: 38.8 % (ref 36.0–46.0)
Hemoglobin: 12.8 g/dL (ref 12.0–15.0)
Immature Granulocytes: 0 %
Lymphocytes Relative: 30 %
Lymphs Abs: 1.5 10*3/uL (ref 0.7–4.0)
MCH: 31.6 pg (ref 26.0–34.0)
MCHC: 33 g/dL (ref 30.0–36.0)
MCV: 95.8 fL (ref 80.0–100.0)
Monocytes Absolute: 0.4 10*3/uL (ref 0.1–1.0)
Monocytes Relative: 7 %
Neutro Abs: 2.7 10*3/uL (ref 1.7–7.7)
Neutrophils Relative %: 56 %
Platelets: 204 10*3/uL (ref 150–400)
RBC: 4.05 MIL/uL (ref 3.87–5.11)
RDW: 12.4 % (ref 11.5–15.5)
WBC: 5 10*3/uL (ref 4.0–10.5)
nRBC: 0 % (ref 0.0–0.2)

## 2021-11-13 LAB — I-STAT CHEM 8, ED
BUN: 6 mg/dL — ABNORMAL LOW (ref 8–23)
Calcium, Ion: 1.21 mmol/L (ref 1.15–1.40)
Chloride: 101 mmol/L (ref 98–111)
Creatinine, Ser: 0.6 mg/dL (ref 0.44–1.00)
Glucose, Bld: 188 mg/dL — ABNORMAL HIGH (ref 70–99)
HCT: 36 % (ref 36.0–46.0)
Hemoglobin: 12.2 g/dL (ref 12.0–15.0)
Potassium: 4 mmol/L (ref 3.5–5.1)
Sodium: 139 mmol/L (ref 135–145)
TCO2: 27 mmol/L (ref 22–32)

## 2021-11-13 LAB — URINALYSIS, ROUTINE W REFLEX MICROSCOPIC
Bilirubin Urine: NEGATIVE
Glucose, UA: NEGATIVE mg/dL
Hgb urine dipstick: NEGATIVE
Ketones, ur: NEGATIVE mg/dL
Leukocytes,Ua: NEGATIVE
Nitrite: NEGATIVE
Protein, ur: NEGATIVE mg/dL
Specific Gravity, Urine: 1.003 — ABNORMAL LOW (ref 1.005–1.030)
pH: 6 (ref 5.0–8.0)

## 2021-11-13 LAB — MAGNESIUM: Magnesium: 2 mg/dL (ref 1.7–2.4)

## 2021-11-13 LAB — HIV ANTIBODY (ROUTINE TESTING W REFLEX): HIV Screen 4th Generation wRfx: NONREACTIVE

## 2021-11-13 LAB — TROPONIN I (HIGH SENSITIVITY)
Troponin I (High Sensitivity): 7 ng/L (ref <18)
Troponin I (High Sensitivity): 6 ng/L (ref <18)

## 2021-11-13 LAB — BASIC METABOLIC PANEL
Anion gap: 9 (ref 5–15)
BUN: 7 mg/dL — ABNORMAL LOW (ref 8–23)
CO2: 26 mmol/L (ref 22–32)
Calcium: 9 mg/dL (ref 8.9–10.3)
Chloride: 103 mmol/L (ref 98–111)
Creatinine, Ser: 0.77 mg/dL (ref 0.44–1.00)
GFR, Estimated: >60 mL/min (ref >60)
Glucose, Bld: 201 mg/dL — ABNORMAL HIGH (ref 70–99)
Potassium: 3.7 mmol/L (ref 3.5–5.1)
Sodium: 138 mmol/L (ref 135–145)

## 2021-11-13 LAB — GLUCOSE, CAPILLARY: Glucose-Capillary: 197 mg/dL — ABNORMAL HIGH (ref 70–99)

## 2021-11-13 LAB — TSH: TSH: 1.024 u[IU]/mL (ref 0.350–4.500)

## 2021-11-13 MED ORDER — HYDROCORTISONE SOD SUC (PF) 100 MG IJ SOLR: 100.0000 mg | Freq: Every day | INTRAMUSCULAR | Status: DC

## 2021-11-13 MED ORDER — ACETAMINOPHEN 650 MG RE SUPP: 650.0000 mg | Freq: Four times a day (QID) | RECTAL | Status: DC | PRN

## 2021-11-13 MED ORDER — GABAPENTIN 600 MG PO TABS: 600.0000 mg | ORAL_TABLET | ORAL | Status: DC

## 2021-11-13 MED ORDER — SODIUM CHLORIDE 0.9 % IV BOLUS
1000.0000 mL | Freq: Once | INTRAVENOUS | Status: AC
  Administered 2021-11-13: 1000 mL via INTRAVENOUS

## 2021-11-13 MED ORDER — ACETAMINOPHEN 325 MG PO TABS: 650.0000 mg | ORAL_TABLET | Freq: Four times a day (QID) | ORAL | Status: DC | PRN

## 2021-11-13 MED ORDER — HYDROCORTISONE SOD SUC (PF) 100 MG IJ SOLR
100.0000 mg | Freq: Two times a day (BID) | INTRAMUSCULAR | Status: DC
  Administered 2021-11-13 – 2021-11-14 (×2): 100 mg via INTRAVENOUS
  Filled 2021-11-13 (×3): qty 2

## 2021-11-13 MED ORDER — INSULIN GLARGINE-YFGN 100 UNIT/ML ~~LOC~~ SOLN
50.0000 [IU] | Freq: Every day | SUBCUTANEOUS | Status: DC
  Administered 2021-11-13: 50 [IU] via SUBCUTANEOUS
  Filled 2021-11-13 (×3): qty 0.5

## 2021-11-13 MED ORDER — ATORVASTATIN CALCIUM 40 MG PO TABS
40.0000 mg | ORAL_TABLET | Freq: Every day | ORAL | Status: DC
  Administered 2021-11-13 – 2021-11-14 (×2): 40 mg via ORAL
  Filled 2021-11-13 (×2): qty 1

## 2021-11-13 MED ORDER — ASPIRIN EC 81 MG PO TBEC
81.0000 mg | DELAYED_RELEASE_TABLET | Freq: Every day | ORAL | Status: DC
Start: 2021-11-13 — End: 2021-11-14
  Administered 2021-11-13 – 2021-11-14 (×2): 81 mg via ORAL
  Filled 2021-11-13 (×2): qty 1

## 2021-11-13 MED ORDER — GABAPENTIN 400 MG PO CAPS
1200.0000 mg | ORAL_CAPSULE | Freq: Every day | ORAL | Status: DC
  Administered 2021-11-13: 1200 mg via ORAL
  Filled 2021-11-13: qty 3

## 2021-11-13 MED ORDER — DULOXETINE HCL 60 MG PO CPEP
60.0000 mg | ORAL_CAPSULE | Freq: Two times a day (BID) | ORAL | Status: DC
  Administered 2021-11-13 – 2021-11-14 (×3): 60 mg via ORAL
  Filled 2021-11-13 (×3): qty 1

## 2021-11-13 MED ORDER — MIDODRINE HCL 5 MG PO TABS
10.0000 mg | ORAL_TABLET | Freq: Three times a day (TID) | ORAL | Status: DC
  Administered 2021-11-13 – 2021-11-14 (×3): 10 mg via ORAL
  Filled 2021-11-13 (×3): qty 2

## 2021-11-13 MED ORDER — MIDODRINE HCL 5 MG PO TABS
10.0000 mg | ORAL_TABLET | Freq: Once | ORAL | Status: AC
  Administered 2021-11-13: 10 mg via ORAL
  Filled 2021-11-13: qty 2

## 2021-11-13 MED ORDER — ENOXAPARIN SODIUM 40 MG/0.4ML IJ SOSY
40.0000 mg | PREFILLED_SYRINGE | INTRAMUSCULAR | Status: DC
  Administered 2021-11-13: 40 mg via SUBCUTANEOUS
  Filled 2021-11-13: qty 0.4

## 2021-11-13 MED ORDER — SODIUM CHLORIDE 0.9% FLUSH
3.0000 mL | Freq: Two times a day (BID) | INTRAVENOUS | Status: DC
  Administered 2021-11-13: 3 mL via INTRAVENOUS

## 2021-11-13 MED ORDER — LACTATED RINGERS IV SOLN: INTRAVENOUS | Status: DC

## 2021-11-13 MED ORDER — GABAPENTIN 300 MG PO CAPS
600.0000 mg | ORAL_CAPSULE | Freq: Two times a day (BID) | ORAL | Status: DC
  Administered 2021-11-13 – 2021-11-14 (×3): 600 mg via ORAL
  Filled 2021-11-13 (×3): qty 2

## 2021-11-13 MED ORDER — PYRIDOSTIGMINE BROMIDE 60 MG PO TABS
60.0000 mg | ORAL_TABLET | Freq: Three times a day (TID) | ORAL | Status: DC
  Filled 2021-11-13 (×2): qty 1

## 2021-11-13 MED ORDER — INSULIN ASPART 100 UNIT/ML IJ SOLN
0.0000 [IU] | Freq: Three times a day (TID) | INTRAMUSCULAR | Status: DC
  Administered 2021-11-14: 5 [IU] via SUBCUTANEOUS
  Administered 2021-11-14: 3 [IU] via SUBCUTANEOUS

## 2021-11-13 MED ORDER — LACTATED RINGERS IV BOLUS
1000.0000 mL | Freq: Once | INTRAVENOUS | Status: AC
  Administered 2021-11-13: 1000 mL via INTRAVENOUS

## 2021-11-13 MED ORDER — HYDROCORTISONE SOD SUC (PF) 100 MG IJ SOLR
100.0000 mg | Freq: Once | INTRAMUSCULAR | Status: AC
  Administered 2021-11-13: 100 mg via INTRAVENOUS
  Filled 2021-11-13: qty 2

## 2021-11-13 MED ORDER — MIDODRINE HCL 5 MG PO TABS: 10.0000 mg | ORAL_TABLET | Freq: Three times a day (TID) | ORAL | Status: DC

## 2021-11-13 NOTE — H&P (Signed)
?History and Physical  ? ? ?Patient: [name] [name] JQB:[id number] DOB: 06/22/1961 ?DOA: 11/13/2021 ?DOS: the patient was seen and examined on 11/13/2021 ?PCP: [name] Pepper, MD  ?Patient coming from: Home - lives with a roommate ?Chief Complaint: syncope  ? ?HPI: [name] [name] is a 61 y.o. female with medical history significant of   ?chronic pain; uncontrolled DM (A1c >9); HTN; adrenal insufficiency; and orthostatic hypotension, hx of CVA who presented to ED with complaints of syncope.  She states early this morning she had an episode of fecal incontinence in her bed and she went to the bathroom and had stool all over her.  She took a shower and when she got out she got weak, dizzy and passed out. She slid down to the ground, didn't hit her head that she thinks (doesn't know) and woke up laying on the floor. Doesn't think she was out for long. She told her roommate who called EMS. She doesn't know how many episodes of diarrhea she has had, but this all started today. Denies any stomach pain, N/V and no known sick contacts.  ? ?She stopped taking her midodrine about a week ago. Thought it gave her diarrhea. She has missed 2 doses of her cortef.  She denies any suicidal or homicidal ideations and didn't purposefully not take her medication to hurt herself. States she just thought she didn't need it.  ? ? ?Denies any fever/chills, vision changes/headaches, chest pain or palpitations, shortness of breath or cough, abdominal pain, N/V, dysuria or leg swelling.  ? ? ? ?ER Course:  vitals: afebrile, bp: 111/68, HR: 114, RR: 13, oxygen: 100%RA ?Pertinent labs: none  ?In ED: given 2L IVF, midodrine, and 155m of solu-cortef  ? ? ?Review of Systems: As mentioned in the history of present illness. All other systems reviewed and are negative. ?Past Medical History:  ?Diagnosis Date  ? Cervical disc disorder with radiculopathy of cervical region   ? Chronic pain syndrome   ? Degenerative joint disease   ? Depression dx 1997  ?  Diabetes mellitus, type 2 ([place] 2011  ? No insulin  ? Diabetic polyneuropathy ([place]   ? GERD (gastroesophageal reflux disease)   ? Glaucoma   ? Headache(784.0)   ? Hypertension   ? Lab 11/2011:  CXR, EKG, CBC, TSH, BMet, troponin-normal; lipid profile: 188, 131, 36, 126   ? Lumbar herniated disc   ? Non-compliance   ? Pancreatic cyst   ? Endoscopic aspiration in 09/2009  ? Pneumonia 08/02/2012  ? Shingles   ? 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 Torn meniscus   ? Vertigo   ? ?Past Surgical History:  ?Procedure Laterality Date  ? ABDOMINAL HYSTERECTOMY    ? CESAREAN SECTION    ? ORIF ANKLE FRACTURE Right 02/25/2020  ? Procedure: OPEN REDUCTION INTERNAL FIXATION (ORIF) ANKLE FRACTURE;  Surgeon: BLovell Sheehan MD;  Location: [facility] ORS;  Service: Orthopedics;  Laterality: Right;  ? ?Social History:  reports that she quit smoking about 43 years ago. Her smoking use included cigarettes. She has never used smokeless tobacco. She reports that she does not currently use drugs after having used the following drugs: Marijuana. She reports that she does not drink alcohol. ? ?Allergies  ?Allergen Reactions  ? Tramadol Nausea And Vomiting  ?  REACTION: Projectile vomiting  ? Tramadol Hcl   ?  Other reaction(s): vomiting  ? ? ?Family History  ?Problem Relation Age of Onset  ?  Depression Mother   ?     type 1  ? Diabetes Mother   ? Renal Disease Mother   ? Lung cancer Father   ? Heart attack Brother   ?     Died age 64 - was told due to massive MI/heart "exploded"  ? Heart failure Maternal Grandmother   ?     Details not totally clear  ? ? ?Prior to Admission medications   ?Medication Sig Start Date End Date Taking? Authorizing Provider  ?Accu-Chek Softclix Lancets lancets Use as instructed tid before meals 06/24/21  Yes [name] Rakes, MD  ?acetaminophen (TYLENOL) 325 MG tablet Take 1-2 tablets (325-650 mg total) by mouth every 4 (four) hours as needed for mild pain. 08/27/21  Yes [name], [name] Anchors, PA-C  ?aspirin 81 MG EC  tablet Take 1 tablet (81 mg total) by mouth daily. Swallow whole. 08/27/21  Yes [name], [name] Anchors, PA-C  ?atorvastatin (LIPITOR) 40 MG tablet TAKE 1 TABLET(40 MG) BY MOUTH DAILY ?Patient taking differently: Take 40 mg by mouth daily. 10/21/21  Yes [name] [name], [name] Salk, MD  ?Blood Glucose Monitoring Suppl (ACCU-CHEK GUIDE) w/Device KIT Use as directed tid 06/24/21  Yes [name] Rakes, MD  ?DULoxetine (CYMBALTA) 60 MG capsule Take 1 capsule (60 mg total) by mouth 2 (two) times daily. 06/24/21 06/24/22 Yes [name] Rakes, MD  ?gabapentin (NEURONTIN) 600 MG tablet TAKE ONE TABLET (600 MG) BY MOUTH EVERY MORNING AND AFTERNOON, TAKE TWO TABLETS AT NIGHT ?Patient taking differently: Take 600 mg by mouth See admin instructions. 600 mg in the morning and after noon ?1200 mg at bedtime. 07/28/21  Yes [name] Rakes, MD  ?glucose blood (ACCU-CHEK GUIDE) test strip Use as instructed tid 06/24/21  Yes Newlin, Enobong, MD  ?hydrocortisone (CORTEF) 20 MG tablet TAKE 1 TABLET(20 MG) BY MOUTH DAILY ?Patient taking differently: Take 20 mg by mouth daily. 10/21/21  Yes [name] [name], [name] Salk, MD  ?insulin aspart (NOVOLOG) 100 UNIT/ML injection Inject 6 Units into the skin 3 (three) times daily with meals. ?Patient taking differently: Inject 0-20 Units into the skin See admin instructions. Tid per sliding scale 08/20/21  Yes [name] Friendly, MD  ?insulin glargine (LANTUS SOLOSTAR) 100 UNIT/ML Solostar Pen Inject 50 Units into the skin at bedtime. 02/24/18  Yes [provider]  ?Insulin Pen Needle (PEN NEEDLES) 31G X 8 MM MISC Use as directed 08/12/20  Yes [name], [name] Leber, MD  ?midodrine (PROAMATINE) 10 MG tablet TAKE 1 TABLET(10 MG) BY MOUTH THREE TIMES DAILY WITH MEALS ?Patient not taking: Reported on 11/13/2021 10/21/21   [name] Blake, MD  ?polyethylene glycol (MIRALAX / GLYCOLAX) 17 g packet Take 17 g by mouth daily as needed for mild constipation. 08/20/21  Yes [name] Friendly, MD  ?pyridostigmine (MESTINON) 60 MG tablet Take 1  tablet (60 mg total) by mouth 3 (three) times daily. 10/20/21  Yes [name] Pacas, MD  ?insulin glargine-yfgn (SEMGLEE) 100 UNIT/ML injection Inject 0.55 mLs (55 Units total) into the skin at bedtime. ?Patient not taking: Reported on 11/13/2021 08/20/21   [name] Friendly, MD  ?polycarbophil (FIBERCON) 625 MG tablet Take 1 tablet (625 mg total) by mouth daily. ?Patient not taking: Reported on 11/13/2021 08/27/21   Bary Leriche, PA-C  ? ? ?Physical Exam: ?Vitals:  ? 11/13/21 1630 11/13/21 1645 11/13/21 1730 11/13/21 1745  ?BP: 128/68 137/78 120/69 127/72  ?Pulse: (!) 124 (!) 127 (!) 123 (!) 123  ?Resp: 18 (!) 0 13 19  ?Temp:      ?  TempSrc:      ?SpO2: 95% 94% 95% 92%  ? ?General:  Appears calm and comfortable and is in NAD ?Eyes:  PERRL, EOMI, normal lids, iris ?ENT:  grossly normal hearing, lips & tongue, dry mucous membranes; appropriate dentition ?Neck:  no LAD, masses or thyromegaly; no carotid bruits ?Cardiovascular:  sinus tachycardia, no m/r/g. No LE edema.  ?Respiratory:   CTA bilaterally with no wheezes/rales/rhonchi.  Normal respiratory effort. ?Abdomen:  soft, NT, ND, NABS ?Back:   normal alignment, no CVAT ?Skin:  no rash or induration seen on limited exam ?Musculoskeletal:  grossly normal tone BUE/BLE, good ROM, no bony abnormality ?Lower extremity:  No LE edema.  Limited foot exam with no ulcerations.  2+ distal pulses. ?Psychiatric:  flat mood and affect. speech fluent and appropriate, Aox3. Denies any SI/HI  ?Neurologic:  CN 2-12 grossly intact, moves all extremities in coordinated fashion, sensation intact ? ? ?Radiological Exams on Admission: ?Independently reviewed - see discussion in A/P where applicable ? ?CT HEAD WO CONTRAST (5MM) ? ?Result Date: 11/13/2021 ?CLINICAL DATA:  Syncope/presyncope, cerebrovascular cause suspected EXAM: CT HEAD WITHOUT CONTRAST TECHNIQUE: Contiguous axial images were obtained from the base of the skull through the vertex without intravenous contrast. RADIATION DOSE  REDUCTION: This exam was performed according to the departmental dose-optimization program which includes automated exposure control, adjustment of the mA and/or kV according to patient size and/or use of iterative

## 2021-11-13 NOTE — Assessment & Plan Note (Addendum)
History of sinus tachycardia, but appears to be worsening per OV notes  ?? If due to her dysautonomia and sudden stop of her cortef  ?Supposed to see cardiology, but hasn't followed up per her daughter  ?No hx of holter/zio done ?Will give IVF, IV steroids and see if any improvement and continue to follow  ? ?

## 2021-11-13 NOTE — ED Notes (Signed)
While assessing orthostatic VS pt endorsed dizziness while sitting on the side of bed along with a decrease in BP. BP recovered to 115/64 in semi fowlers position. Notified PA.  ? ? ?

## 2021-11-13 NOTE — Assessment & Plan Note (Addendum)
61 year old female presenting with syncope and collapse in setting of known orthostatic hypotension, adrenal insufficieny and autonomic dysfunction ?-observation to telemetry ?-troponin wnl x 2  ?-check head CT as unknown if she hit her head ?-appears to be likely from diarrhea and abruptly stopped taking her midodrine 1 week ago. Has missed 2 days (possibly more) of her cortef.  ?-On 4/3 saw neurology who stopped her midodrine with her worsening tachycardia and changed her to mestinon '60mg'$  TID. Also didn't think best option with her stroke.  Likely cause of her loose stools. Stopping mestinon, can cause syncope and do not see that midodrine cause tachycardia.  ?-will continue midodrine for now, may need neurology input if this is not best option with hx of CVA.  ?-orthostatic vitals markedly abnormal. Continue daily checks  ?-given '100mg'$  of her hydrocortisone in ED, continue IV hydrocortisone q12 hour  ?- IVF ?-TED hose ?-echo in 1/23: normal EF, no AS and grade 1 DD ?

## 2021-11-13 NOTE — ED Notes (Signed)
Pt was assisted to bedside commode and appeared very weak. Pt was assisted up from commode and her legs "buckled" and she said it felt like her legs gave out. Pt did NOT fall and was quickly turned around to sit on the bed. Pt's BP was soft after using the bathroom. ?

## 2021-11-13 NOTE — ED Triage Notes (Signed)
The pt arrived by [facility] from home  where she woke up with vomiting and diarrhea  on the way there or back she fainted  no head injury   c/o weakness and dizziness  iv per gems no meds given   she has a history of the same ?

## 2021-11-13 NOTE — Assessment & Plan Note (Signed)
A1C of 9.9 in 09/2021 ?Continue lantus and add SSI ?Continue regular accuchecks qac/hs  ?

## 2021-11-13 NOTE — Progress Notes (Signed)
?   11/13/21 1850  ?Assess: MEWS Score  ?Temp 98.1 ?F (36.7 ?C)  ?BP 105/60  ?Pulse Rate (!) 123  ?SpO2 96 %  ?O2 Device Room Air  ?Assess: MEWS Score  ?MEWS Temp 0  ?MEWS Systolic 0  ?MEWS Pulse 2  ?MEWS RR 0  ?MEWS LOC 0  ?MEWS Score 2  ?MEWS Score Color Yellow  ?Assess: if the MEWS score is Yellow or Red  ?Were vital signs taken at a resting state? Yes  ?Focused Assessment No change from prior assessment  ?Early Detection of Sepsis Score *See Row Information* Low  ?MEWS guidelines implemented *See Row Information* No, previously yellow, continue vital signs every 4 hours  ?Treat  ?Pain Scale 0-10  ?Pain Score 0  ?Escalate  ?MEWS: Escalate Yellow: discuss with charge nurse/RN and consider discussing with provider and RRT  ?Notify: Charge Nurse/RN  ?Name of Charge Nurse/RN Notified Latoya, RN  ?Date Charge Nurse/RN Notified 11/13/21  ?Time Charge Nurse/RN Notified 1908  ? ? ?

## 2021-11-13 NOTE — Assessment & Plan Note (Addendum)
Started this AM with multiple episodes, but has intermittent bouts of diarrhea that has been going on.  ?Check stool PCR panel, no hx of antibiotics/bloody diarrhea or abdominal pain  ?-stop mestinon, loose stool common SE  ?Has history of fecal incontinence, discussed would need work up with GI outpatient. Could have component of dysautonomia with this as well.  ?

## 2021-11-13 NOTE — Assessment & Plan Note (Addendum)
No evidence of acute crisis, but has missed 2 days of her cortef with syncope and orthostasis ?Continue IV hydrocortisone '100mg'$  BID ?Start back her midodrine ?Received 2L IVF in ED, continue maintenance IVF, although volume status appears euvolemic  ?Educated on importance of not stopping her steroids ?

## 2021-11-13 NOTE — Assessment & Plan Note (Signed)
Continue ASA, lipitor ?Repeat head CT here shows remote infarct from 07/2021.  ?

## 2021-11-13 NOTE — Assessment & Plan Note (Addendum)
Flat affect, but appears to be baseline reading through notes. No intention to harm herself by stopping pills. Denies any SI/HI  ?Continue cymbalta Bid  ?

## 2021-11-13 NOTE — ED Provider Notes (Signed)
?[facility] ?Provider Note ? ? ?CSN: [id number] ?Arrival date & time: 11/13/21  0559 ? ?  ? ?History ? ?Chief Complaint  ?Patient presents with  ? Loss of Consciousness  ? ? ?[name] [name] is a 61 y.o. female who presents with syncope. ?She  has a past medical history of Cervical disc disorder with radiculopathy of cervical region, Chronic pain syndrome, Degenerative joint disease, Diabetes mellitus, type 2, Diabetic polyneuropathy, Glaucoma, Lumbar herniated disc, Medication Non-compliance, Pancreatic cyst, CVA, orthostatic hypotension, and adrenal insufficiency. She states that she woke up due to diarrhea and stool incontinence. She took a shower and when she got she got light headed, she sat down and then lost consciousness. She denies abd pain, vomiting, recurrent diarrhea, palpitations, fever, chills, cp, sob, leg swelling. ?She has no acute neuro complaints. She stopped taking her cortef 1 week ago and when asked why she states " I just stopped." ?She has no melena or hematochezia. ? ? ?The history is provided by the patient and medical records. No language interpreter was used.  ?Loss of Consciousness ? ?  ? ?Home Medications ?Prior to Admission medications   ?Medication Sig Start Date End Date Taking? Authorizing Provider  ?Accu-Chek Softclix Lancets lancets Use as instructed tid before meals 06/24/21  Yes Charlott Rakes, MD  ?acetaminophen (TYLENOL) 325 MG tablet Take 1-2 tablets (325-650 mg total) by mouth every 4 (four) hours as needed for mild pain. 08/27/21  Yes Love, Ivan Anchors, PA-C  ?aspirin 81 MG EC tablet Take 1 tablet (81 mg total) by mouth daily. Swallow whole. 08/27/21  Yes Love, Ivan Anchors, PA-C  ?atorvastatin (LIPITOR) 40 MG tablet TAKE 1 TABLET(40 MG) BY MOUTH DAILY ?Patient taking differently: Take 40 mg by mouth daily. 10/21/21  Yes Kirsteins, Luanna Salk, MD  ?Blood Glucose Monitoring Suppl (ACCU-CHEK GUIDE) w/Device KIT Use as directed tid 06/24/21  Yes Charlott Rakes, MD  ?DULoxetine (CYMBALTA) 60 MG capsule Take 1 capsule (60 mg total) by mouth 2 (two) times daily. 06/24/21 06/24/22 Yes Charlott Rakes, MD  ?gabapentin (NEURONTIN) 600 MG tablet TAKE ONE TABLET (600 MG) BY MOUTH EVERY MORNING AND AFTERNOON, TAKE TWO TABLETS AT NIGHT ?Patient taking differently: Take 600 mg by mouth See admin instructions. 600 mg in the morning and after noon ?1200 mg at bedtime. 07/28/21  Yes Charlott Rakes, MD  ?glucose blood (ACCU-CHEK GUIDE) test strip Use as instructed tid 06/24/21  Yes Newlin, Enobong, MD  ?hydrocortisone (CORTEF) 20 MG tablet TAKE 1 TABLET(20 MG) BY MOUTH DAILY ?Patient taking differently: Take 20 mg by mouth daily. 10/21/21  Yes Kirsteins, Luanna Salk, MD  ?insulin aspart (NOVOLOG) 100 UNIT/ML injection Inject 6 Units into the skin 3 (three) times daily with meals. ?Patient taking differently: Inject 0-20 Units into the skin See admin instructions. Tid per sliding scale 08/20/21  Yes Alma Friendly, MD  ?insulin glargine (LANTUS SOLOSTAR) 100 UNIT/ML Solostar Pen Inject 50 Units into the skin at bedtime. 02/24/18  Yes [provider]  ?Insulin Pen Needle (PEN NEEDLES) 31G X 8 MM MISC Use as directed 08/12/20  Yes Ghimire, Henreitta Leber, MD  ?midodrine (PROAMATINE) 10 MG tablet TAKE 1 TABLET(10 MG) BY MOUTH THREE TIMES DAILY WITH MEALS ?Patient not taking: Reported on 11/13/2021 10/21/21   Charlett Blake, MD  ?polyethylene glycol (MIRALAX / GLYCOLAX) 17 g packet Take 17 g by mouth daily as needed for mild constipation. 08/20/21  Yes Alma Friendly, MD  ?pyridostigmine (MESTINON) 60 MG tablet  Take 1 tablet (60 mg total) by mouth 3 (three) times daily. 10/20/21  Yes Marcial Pacas, MD  ?polycarbophil (FIBERCON) 625 MG tablet Take 1 tablet (625 mg total) by mouth daily. ?Patient not taking: Reported on 11/13/2021 08/27/21   Bary Leriche, PA-C  ?   ? ?Allergies    ?Tramadol and Tramadol hcl   ? ?Review of Systems   ?Review of Systems  ?Cardiovascular:  Positive for  syncope.  ? ?Physical Exam ?Updated Vital Signs ?BP 125/72   Pulse (!) 115   Temp 98.2 ?F (36.8 ?C) (Oral)   Resp (!) 24   SpO2 96%  ?Physical Exam ?Vitals and nursing note reviewed.  ?Constitutional:   ?   General: She is not in acute distress. ?   Appearance: She is well-developed. She is not diaphoretic.  ?HENT:  ?   Head: Normocephalic and atraumatic.  ?   Right Ear: External ear normal.  ?   Left Ear: External ear normal.  ?   Nose: Nose normal.  ?   Mouth/Throat:  ?   Mouth: Mucous membranes are moist.  ?Eyes:  ?   General: No scleral icterus. ?   Conjunctiva/sclera: Conjunctivae normal.  ?   Pupils: Pupils are equal, round, and reactive to light.  ?   Comments: No horizontal, vertical or rotational nystagmus  ?Neck:  ?   Comments: Full active and passive ROM without pain ?No midline or paraspinal tenderness ?No nuchal rigidity or meningeal signs ?Cardiovascular:  ?   Rate and Rhythm: Normal rate and regular rhythm.  ?   Heart sounds: Normal heart sounds. No murmur heard. ?  No friction rub. No gallop.  ?Pulmonary:  ?   Effort: Pulmonary effort is normal. No respiratory distress.  ?   Breath sounds: Normal breath sounds. No wheezing or rales.  ?Abdominal:  ?   General: Bowel sounds are normal. There is no distension.  ?   Palpations: Abdomen is soft. There is no mass.  ?   Tenderness: There is abdominal tenderness. There is rebound. There is no guarding.  ?Musculoskeletal:     ?   General: Normal range of motion.  ?   Cervical back: Normal range of motion and neck supple.  ?Lymphadenopathy:  ?   Cervical: No cervical adenopathy.  ?Skin: ?   General: Skin is warm and dry.  ?   Findings: No rash.  ?Neurological:  ?   Mental Status: She is alert and oriented to person, place, and time.  ?   Cranial Nerves: No cranial nerve deficit.  ?   Motor: No abnormal muscle tone.  ?   Coordination: Coordination normal.  ?   Deep Tendon Reflexes: Reflexes are normal and symmetric.  ?   Comments: Mental Status:  ?Alert,  oriented, thought content appropriate. Speech fluent without evidence of aphasia. Able to follow 2 step commands without difficulty.  ?Cranial Nerves:  ?II:  Peripheral visual fields grossly normal, pupils equal, round, reactive to light ?III,IV, VI: ptosis not present, extra-ocular motions intact bilaterally  ?V,VII: smile symmetric, facial light touch sensation equal ?VIII: hearing grossly normal bilaterally  ?IX,X: midline uvula rise  ?XI: bilateral shoulder shrug equal and strong ?XII: midline tongue extension  ?Motor:  ?5/5 in upper and lower extremities bilaterally including strong and equal grip strength and dorsiflexion/plantar flexion ?Sensory: Pinprick and light touch normal in all extremities.  ?Deep Tendon Reflexes: 2+ and symmetric  ?Cerebellar: normal finger-to-nose with bilateral upper extremities ?Gait: normal gait and  balance ?CV: distal pulses palpable throughout   ?Psychiatric:     ?   Behavior: Behavior normal.     ?   Thought Content: Thought content normal.     ?   Judgment: Judgment normal.  ? ? ?ED Results / Procedures / Treatments   ?Labs ?(all labs ordered are listed, but only abnormal results are displayed) ?Labs Reviewed  ?BASIC METABOLIC PANEL - Abnormal; Notable for the following components:  ?    Result Value  ? Glucose, Bld 201 (*)   ? BUN 7 (*)   ? All other components within normal limits  ?URINALYSIS, ROUTINE W REFLEX MICROSCOPIC - Abnormal; Notable for the following components:  ? Specific Gravity, Urine 1.003 (*)   ? All other components within normal limits  ?CBG MONITORING, ED - Abnormal; Notable for the following components:  ? Glucose-Capillary 195 (*)   ? All other components within normal limits  ?I-STAT CHEM 8, ED - Abnormal; Notable for the following components:  ? BUN 6 (*)   ? Glucose, Bld 188 (*)   ? All other components within normal limits  ?CBC WITH DIFFERENTIAL/PLATELET  ?TROPONIN I (HIGH SENSITIVITY)  ?TROPONIN I (HIGH SENSITIVITY)  ? ? ?EKG ?EKG  Interpretation ? ?Date/Time:  Thursday November 13 2021 07:29:39 EDT ?Ventricular Rate:  113 ?PR Interval:  129 ?QRS Duration: 77 ?QT Interval:  354 ?QTC Calculation: 486 ?R Axis:   82 ?Text Interpretation: Sinus tachycardia Borderline

## 2021-11-13 NOTE — Assessment & Plan Note (Signed)
Continue gabapentin.

## 2021-11-14 DIAGNOSIS — F322 Major depressive disorder, single episode, severe without psychotic features: Secondary | ICD-10-CM

## 2021-11-14 DIAGNOSIS — Z794 Long term (current) use of insulin: Secondary | ICD-10-CM

## 2021-11-14 DIAGNOSIS — R000 Tachycardia, unspecified: Secondary | ICD-10-CM

## 2021-11-14 DIAGNOSIS — E2740 Unspecified adrenocortical insufficiency: Secondary | ICD-10-CM

## 2021-11-14 DIAGNOSIS — I951 Orthostatic hypotension: Secondary | ICD-10-CM | POA: Diagnosis not present

## 2021-11-14 DIAGNOSIS — R55 Syncope and collapse: Secondary | ICD-10-CM | POA: Diagnosis not present

## 2021-11-14 DIAGNOSIS — G894 Chronic pain syndrome: Secondary | ICD-10-CM

## 2021-11-14 DIAGNOSIS — I639 Cerebral infarction, unspecified: Secondary | ICD-10-CM

## 2021-11-14 DIAGNOSIS — G909 Disorder of the autonomic nervous system, unspecified: Secondary | ICD-10-CM

## 2021-11-14 DIAGNOSIS — E1165 Type 2 diabetes mellitus with hyperglycemia: Secondary | ICD-10-CM

## 2021-11-14 LAB — BASIC METABOLIC PANEL
Anion gap: 6 (ref 5–15)
BUN: 13 mg/dL (ref 8–23)
CO2: 22 mmol/L (ref 22–32)
Calcium: 8.6 mg/dL — ABNORMAL LOW (ref 8.9–10.3)
Chloride: 108 mmol/L (ref 98–111)
Creatinine, Ser: 0.59 mg/dL (ref 0.44–1.00)
GFR, Estimated: >60 mL/min (ref >60)
Glucose, Bld: 156 mg/dL — ABNORMAL HIGH (ref 70–99)
Potassium: 5.3 mmol/L — ABNORMAL HIGH (ref 3.5–5.1)
Sodium: 136 mmol/L (ref 135–145)

## 2021-11-14 LAB — CBC
HCT: 37.5 % (ref 36.0–46.0)
Hemoglobin: 12.7 g/dL (ref 12.0–15.0)
MCH: 32.2 pg (ref 26.0–34.0)
MCHC: 33.9 g/dL (ref 30.0–36.0)
MCV: 94.9 fL (ref 80.0–100.0)
Platelets: 118 10*3/uL — ABNORMAL LOW (ref 150–400)
RBC: 3.95 MIL/uL (ref 3.87–5.11)
RDW: 12.5 % (ref 11.5–15.5)
WBC: 7.1 10*3/uL (ref 4.0–10.5)
nRBC: 0 % (ref 0.0–0.2)

## 2021-11-14 LAB — GLUCOSE, CAPILLARY
Glucose-Capillary: 208 mg/dL — ABNORMAL HIGH (ref 70–99)
Glucose-Capillary: 256 mg/dL — ABNORMAL HIGH (ref 70–99)
Glucose-Capillary: 334 mg/dL — ABNORMAL HIGH (ref 70–99)

## 2021-11-14 LAB — RAPID URINE DRUG SCREEN, HOSP PERFORMED
Amphetamines: NOT DETECTED
Barbiturates: NOT DETECTED
Benzodiazepines: NOT DETECTED
Cocaine: NOT DETECTED
Opiates: NOT DETECTED
Tetrahydrocannabinol: NOT DETECTED

## 2021-11-14 MED ORDER — HYDROCORTISONE 20 MG PO TABS
20.0000 mg | ORAL_TABLET | Freq: Two times a day (BID) | ORAL | Status: DC
Start: 2021-11-14 — End: 2021-11-14
  Filled 2021-11-14: qty 1

## 2021-11-14 MED ORDER — MIDODRINE HCL 2.5 MG PO TABS: 2.5000 mg | ORAL_TABLET | Freq: Three times a day (TID) | ORAL | 0 refills | Status: AC

## 2021-11-14 MED ORDER — HYDROCORTISONE 10 MG PO TABS: ORAL_TABLET | ORAL | 0 refills | Status: AC

## 2021-11-14 MED ORDER — MIDODRINE HCL 5 MG PO TABS: 5.0000 mg | ORAL_TABLET | Freq: Three times a day (TID) | ORAL | Status: DC

## 2021-11-14 NOTE — Evaluation (Signed)
Physical Therapy Evaluation ?Patient Details ?Name: [name] [name] ?MRN: [id number] ?DOB: [birth year] ?Today's Date: 11/14/2021 ? ?History of Present Illness ? [name] [name] is a 61 y.o. female presenting to [facility] 11/13/21 after a syncopal episode where she fell. Imaging (-) for acute findings. PMH includes L CVA, HTN, syncope, orthostatic hypotension, DM2, adrenal insufficiency, R ankle ORIF, and major depression disorder.  ?Clinical Impression ? Patient presented to [facility] on 11/13/21 after a syncopal episode where she fell at home. Pt's primary impairment is decreased cardiopulmonary conditioning that is limiting her ability to safely and independently transfer, ambulate, and navigate stairs. Patient is mod I with bed mobility and transfers without use of AD. Pt had soft BP with standing, however, raised to normal level upon sitting. Orthostatic table listed below. RN notified. SPT recommending no PT upon D/C. PT will continue to follow acutely to maximize pt's safety with functional mobility.  ?   ?Orthostatic BPs ? ?Supine 119/11mHg  ?Sitting 110/633mg  ?Standing 96/5571m  ?Sitting at in recliner after transfer 131/48m19m ? ?  ? ?Recommendations for follow up therapy are one component of a multi-disciplinary discharge planning process, led by the attending physician.  Recommendations may be updated based on patient status, additional functional criteria and insurance authorization. ? ?Follow Up Recommendations No PT follow up ? ?  ?Assistance Recommended at Discharge PRN  ?Patient can return home with the following ? Help with stairs or ramp for entrance;Assist for transportation ? ?  ?Equipment Recommendations None recommended by PT  ?Recommendations for Other Services ?    ?  ?Functional Status Assessment Patient has had a recent decline in their functional status and demonstrates the ability to make significant improvements in function in a reasonable and predictable amount of time.  ? ?  ?Precautions / Restrictions  Precautions ?Precautions: Fall ?Precaution Comments: soft BP ?Restrictions ?Weight Bearing Restrictions: No  ? ?  ? ?Mobility ? Bed Mobility ?Overal bed mobility: Modified Independent ?  ?  ?  ?  ?  ?  ?General bed mobility comments: BP in supine: 119/62mm24mrequires increased time to sit EOB due to dizziness symptoms. BP in sitting: 110/62mmH47mt dizziness resolved ~30 seconds into sitting. ?  ? ?Transfers ?Overall transfer level: Modified independent ?Equipment used: None ?  ?  ?  ?  ?  ?  ?  ?General transfer comment: requiring increased time to power up and obtain upright posture in standing. However, no physical assist needed. Pt throughout was not unsteady. Pt BP in standing 96/55mmHg73m did appear slightly more pale, however, otherwise asymptomatic. Pt returned to sit at EOB with BP 130/58mmHg 6mitting. Second transfer performed to chair. BP in recliner after all mobility 131/48mmHg. 24mher mobility deferred due to soft BP. ?  ? ?Ambulation/Gait ?  ?  ?  ?  ?  ?  ?  ?General Gait Details: deferred due to soft BP ? ?Stairs ?  ?  ?  ?  ?  ? ?Wheelchair Mobility ?  ? ?Modified Rankin (Stroke Patients Only) ?  ? ?  ? ?Balance Overall balance assessment: No apparent balance deficits (not formally assessed) ?  ?  ?  ?  ?  ?  ?  ?  ?  ?  ?  ?  ?  ?  ?  ?  ?  ?  ?   ? ? ? ?Pertinent Vitals/Pain Pain Assessment ?Pain Assessment: Faces ?Faces Pain Scale: No hurt  ? ? ?Home Living Family/patient expects to be discharged  to:: Private residence ?Living Arrangements: Non-relatives/Friends ?Available Help at Discharge: Friend(s);Available PRN/intermittently ?Type of Home: Apartment ?Home Access: Stairs to enter;Other (comment) ?Entrance Stairs-Rails: None ?Entrance Stairs-Number of Steps: 1 ?  ?Home Layout: One level ?Home Equipment: Conservation officer, nature (2 wheels);Cane - single point;Shower seat;Grab bars - tub/shower ?Additional Comments: friend rides with pt when she drives short distance (one block away) to grocery  store; pt R eye is blurry all the time. Pt has appointment scheduled at optomotrist.  ?  ?Prior Function Prior Level of Function : Independent/Modified Independent ?  ?  ?  ?  ?  ?  ?Mobility Comments: denies using ADs, however, has access ?ADLs Comments: indep; ?  ? ? ?Hand Dominance  ?   ? ?  ?Extremity/Trunk Assessment  ? Upper Extremity Assessment ?Upper Extremity Assessment: Overall WFL for tasks assessed ?  ? ?Lower Extremity Assessment ?Lower Extremity Assessment: Overall WFL for tasks assessed ?  ? ?Cervical / Trunk Assessment ?Cervical / Trunk Assessment: Normal  ?Communication  ? Communication: No difficulties  ?Cognition Arousal/Alertness: Awake/alert ?Behavior During Therapy: [facility] for tasks assessed/performed ?Overall Cognitive Status: Within Functional Limits for tasks assessed ?  ?  ?  ?  ?  ?  ?  ?  ?  ?  ?  ?  ?  ?  ?  ?  ?  ?  ?  ? ?  ?General Comments General comments (skin integrity, edema, etc.): soft BP in standing at 96/32mHg. BP returned to 131/735mg after mobility. HR up to 125bpm during session. ? ?  ?Exercises    ? ?Assessment/Plan  ?  ?PT Assessment Patient needs continued PT services  ?PT Problem List Cardiopulmonary status limiting activity ? ?   ?  ?PT Treatment Interventions Gait training;Stair training;DME instruction;Therapeutic exercise;Therapeutic activities;Functional mobility training;Patient/family education   ? ?PT Goals (Current goals can be found in the Care Plan section)  ?Acute Rehab PT Goals ?Patient Stated Goal: to return home ?PT Goal Formulation: With patient ?Time For Goal Achievement: 11/28/21 ?Potential to Achieve Goals: Good ? ?  ?Frequency Min 3X/week ?  ? ? ?Co-evaluation   ?  ?  ?  ?  ? ? ?  ?AM-PAC PT "6 Clicks" Mobility  ?Outcome Measure Help needed turning from your back to your side while in a flat bed without using bedrails?: None ?Help needed moving from lying on your back to sitting on the side of a flat bed without using bedrails?: None ?Help needed  moving to and from a bed to a chair (including a wheelchair)?: None ?Help needed standing up from a chair using your arms (e.g., wheelchair or bedside chair)?: None ?Help needed to walk in hospital room?: A Little ?Help needed climbing 3-5 steps with a railing? : A Little ?6 Click Score: 22 ? ?  ?End of Session Equipment Utilized During Treatment: Gait belt ?Activity Tolerance: Treatment limited secondary to medical complications (Comment) ?Patient left: in chair;with chair alarm set;with call bell/phone within reach ?Nurse Communication: Mobility status;Other (comment) (soft BP) ?PT Visit Diagnosis: Other abnormalities of gait and mobility (R26.89) ?  ? ?Time: 147824-2353PT Time Calculation (min) (ACUTE ONLY): 23 min ? ? ?Charges:   PT Evaluation ?$PT Eval Low Complexity: 1 Low ?PT Treatments ?$Therapeutic Activity: 8-22 mins ?  ?   ? ? ?IsJonne PlySPT ? ?IsJonne Ply4/28/2023, 4:46 PM ? ?

## 2021-11-14 NOTE — Progress Notes (Signed)
RN asked pt if she needs to use the bathroom. Pt stated "not right now, I feel sleepy and tired." Pt with no c/o nausea, vomiting and diarrhea overnight. ?

## 2021-11-14 NOTE — Plan of Care (Signed)
Goals met ?

## 2021-11-14 NOTE — Discharge Summary (Signed)
?Physician Discharge Summary ?  ?Patient: [name] [name] MRN: [id number] DOB: Mar 01, 1961  ?Admit date:     11/13/2021  ?Discharge date: 11/14/21  ?Discharge Physician: Patrecia Pour  ? ?PCP: London Pepper, MD  ? ?Recommendations at discharge:  ?Follow up with endocrinology is strongly advised for adrenal insufficiency which may be a unifying diagnosis for many of the patient's complaints. Discharged on hydrocortisone 8m qAM / 178mqPM.  ?Follow up with PCP in the next week is recommended with plan to address depressive symptoms (which are multifactorial). Encouraged to seek psychotherapy in addition to any medications that are recommended at that visit.  ?Follow up with ophthalmology for visual impairment. Pt continues to have driving restrictions until this appointment. ? ?Discharge Diagnoses: ?Principal Problem: ?  Syncope and collapse ?Active Problems: ?  Adrenal insufficiency (H[facility]?  Diarrhea ?  Sinus tachycardia ?  Uncontrolled type 2 diabetes mellitus with hyperglycemia, with long-term current use of insulin (H[place]?  Current severe episode of major depressive disorder without psychotic features (HCFlam[place]?  Chronic pain syndrome ?  hx of Cerebrovascular accident  ?  Orthostatic hypotension ?  Autonomic dysfunction ? ?Resolved Problems: ?  * No resolved hospital problems. * ? ?Hospital Course: ?LoKEILI HASTENs a [age] with medical history significant of   ?chronic pain; uncontrolled DM (A1c >9); HTN; adrenal insufficiency; and orthostatic hypotension, hx of CVA who presented to ED with complaints of syncope.  She states early this morning she had an episode of fecal incontinence in her bed and she went to the bathroom and had stool all over her.  She took a shower and when she got out she got weak, dizzy and passed out. She slid down to the ground, didn't hit her head that she thinks (doesn't know) and woke up laying on the floor. Doesn't think she was out for long. She told her roommate who called EMS.  She doesn't know how many episodes of diarrhea she has had, but this all started today. Denies any stomach pain, N/V and no known sick contacts.  ?  ?She stopped taking her midodrine about a week ago. Thought it gave her diarrhea. She has missed 2 doses of her cortef.  She denies any suicidal or homicidal ideations and didn't purposefully not take her medication to hurt herself. States she just thought she didn't need it.  ?  ?  ?Denies any fever/chills, vision changes/headaches, chest pain or palpitations, shortness of breath or cough, abdominal pain, N/V, dysuria or leg swelling.  ? \  ?ER Course:  vitals: afebrile, bp: 111/68, HR: 114, RR: 13, oxygen: 100%RA ?Pertinent labs: none  ?In ED: given 2L IVF, midodrine, and 10058mf solu-cortef  ? ?Hospital course: Hypotension resolved, orthostatic changes decreased in severity once midodrine and solucortef were started. She is orthostatic still, but not symptomatic, will enact lifestyle changes/take precautions/and get up with assistance after discharge. Discussed this with patient and son on day of discharge. Patient would like to go home today.  ? ?Assessment and Plan: ?Orthostatic syncope: Longstanding orthostatic hypotension noted and overall vital signs are improved from admission. Suspect this was worsened by diarrhea and not taking midodrine/steroid. Continues to be mildly orthostatic on day of discharge, though this is, again, improved, and was not symptomatic for the patient. No dysrhythmias noted during hospitalization. Echo in 1/23: normal EF, no AS and grade 1 DD. ? ?Diarrhea: Suspect due to mestinon which we've stopped. Diarrhea resolved. Abd benign.   ? ?  Adrenal insufficiency: Noted to be chronic though not under the care of endocrinology. We've reached out to [facility], office of Dr. Buddy Duty, who si reviewing clinical information and will reportedly reach out to schedule an appointment.  ?- Restart hydrocortisone. Rx 11m qAM, 165mqPM.  ?-  Educated on importance of not stopping her steroids ?  ?Sinus tachycardia: Possibly related to dysautonomia. Stable from prior/chronic. No bleeding, sepsis or dehydration.  ? ?Current severe episode of major depressive disorder without psychotic features (HCAlta?Flat affect, but appears to be baseline reading through notes. No intention to harm herself by stopping pills. Denies any SI/HI  ?Continue cymbalta and urged to follow up with PCP. Had trouble with withdrawal symptoms coming abruptly off high dose effexor in the past.  ? ?Uncontrolled type 2 diabetes mellitus with hyperglycemia, with long-term current use of insulin (HCCoffee Springs?A1C of 9.9 in 09/2021 ?- No change to home regimen at discharge.  ? ?Chronic pain syndrome ?Continue gabapentin  ? ?hx of Cerebrovascular accident  ?Continue ASA, lipitor ?Repeat head CT here shows remote infarct from 07/2021.  ? ?Consultants: None ?Procedures performed: None  ?Disposition: Home ?Diet recommendation:  ?Discharge Diet Orders (From admission, onward)  ? ?  Start     Ordered  ? 11/14/21 0000  Diet Carb Modified       ? 11/14/21 1441  ? ?  ?  ? ?  ? ?Carb modified diet ?DISCHARGE MEDICATION: ?Allergies as of 11/14/2021   ? ?   Reactions  ? Tramadol Nausea And Vomiting  ? REACTION: Projectile vomiting  ? Tramadol Hcl   ? Other reaction(s): vomiting  ? ?  ? ?  ?Medication List  ?  ? ?STOP taking these medications   ? ?pyridostigmine 60 MG tablet ?Commonly known as: Mestinon ?  ? ?  ? ?TAKE these medications   ? ?Accu-Chek Guide test strip ?Generic drug: glucose blood ?Use as instructed tid ?  ?Accu-Chek Guide w/Device Kit ?Use as directed tid ?  ?Accu-Chek Softclix Lancets lancets ?Use as instructed tid before meals ?  ?acetaminophen 325 MG tablet ?Commonly known as: TYLENOL ?Take 1-2 tablets (325-650 mg total) by mouth every 4 (four) hours as needed for mild pain. ?  ?aspirin 81 MG EC tablet ?Take 1 tablet (81 mg total) by mouth daily. Swallow whole. ?  ?atorvastatin 40 MG  tablet ?Commonly known as: LIPITOR ?TAKE 1 TABLET(40 MG) BY MOUTH DAILY ?What changed: See the new instructions. ?  ?DULoxetine 60 MG capsule ?Commonly known as: CYMBALTA ?Take 1 capsule (60 mg total) by mouth 2 (two) times daily. ?  ?gabapentin 600 MG tablet ?Commonly known as: NEURONTIN ?TAKE ONE TABLET (600 MG) BY MOUTH EVERY MORNING AND AFTERNOON, TAKE TWO TABLETS AT NIGHT ?What changed:  ?how much to take ?how to take this ?when to take this ?additional instructions ?  ?hydrocortisone 10 MG tablet ?Commonly known as: CORTEF ?Take 2 tablets (20 mg total) by mouth every morning AND 1 tablet (10 mg total) every evening. ?What changed:  ?medication strength ?See the new instructions. ?  ?insulin aspart 100 UNIT/ML injection ?Commonly known as: novoLOG ?Inject 6 Units into the skin 3 (three) times daily with meals. ?What changed:  ?how much to take ?when to take this ?additional instructions ?  ?Lantus SoloStar 100 UNIT/ML Solostar Pen ?Generic drug: insulin glargine ?Inject 50 Units into the skin at bedtime. ?  ?midodrine 2.5 MG tablet ?Commonly known as: PROAMATINE ?Take 1 tablet (2.5 mg total) by mouth 3 (three)  times daily. ?What changed:  ?medication strength ?See the new instructions. ?  ?Pen Needles 31G X 8 MM Misc ?Use as directed ?  ?polycarbophil 625 MG tablet ?Commonly known as: FIBERCON ?Take 1 tablet (625 mg total) by mouth daily. ?  ?polyethylene glycol 17 g packet ?Commonly known as: MIRALAX / GLYCOLAX ?Take 17 g by mouth daily as needed for mild constipation. ?  ? ?  ? ? Follow-up Information   ? ? London Pepper, MD Follow up.   ?Specialty: Family Medicine ?Contact information: ?[address] ?[address] ?[place] [address] ?[phone] ? ? ?  ?  ? ?  ?  ? ?  ? ?Discharge Exam: ?BP (!) 164/94 (BP Location: Right Arm, Patient Position: Sitting)   Pulse (!) 112   Temp 98.6 ?F (37 ?C) (Oral)   Resp 13   SpO2 95%   ?61yo F in no acute distress ?RRR no MRG or edema ?Clear,  nonlabored ?Alert, oriented, nonfocal ?Depressed mood, broad affect, no SI or psychosis.  ? ?Condition at discharge: stable ? ?The results of significant diagnostics from this hospitalization (including imaging, microbiology, ancillary a

## 2021-11-20 ENCOUNTER — Other Ambulatory Visit: Payer: Self-pay | Admitting: Physical Medicine & Rehabilitation

## 2021-11-20 DIAGNOSIS — I951 Orthostatic hypotension: Secondary | ICD-10-CM

## 2021-12-20 ENCOUNTER — Other Ambulatory Visit: Payer: Self-pay | Admitting: Family Medicine

## 2021-12-20 DIAGNOSIS — E1143 Type 2 diabetes mellitus with diabetic autonomic (poly)neuropathy: Secondary | ICD-10-CM

## 2021-12-20 DIAGNOSIS — Z794 Long term (current) use of insulin: Secondary | ICD-10-CM

## 2021-12-22 NOTE — Telephone Encounter (Signed)
Requested medications are due for refill today.  yes  Requested medications are on the active medications list.  yes  Last refill. 06/24/2021 #60 3 refills  Future visit scheduled.   no  Notes to clinic.  PCP listed is London Pepper.    Requested Prescriptions  Pending Prescriptions Disp Refills   DULoxetine (CYMBALTA) 60 MG capsule [Pharmacy Med Name: DULOXETINE DR 60MG CAPSULES] 60 capsule 2    Sig: TAKE 1 CAPSULE(60 MG) BY MOUTH TWICE DAILY     Psychiatry: Antidepressants - SNRI - duloxetine Failed - 12/20/2021 12:33 PM      Failed - Last BP in normal range    BP Readings from Last 1 Encounters:  11/14/21 (!) 164/94         Passed - Cr in normal range and within 360 days    Creat  Date Value Ref Range Status  02/20/2015 0.74 0.50 - 1.05 mg/dL Final   Creatinine, Ser  Date Value Ref Range Status  11/14/2021 0.59 0.44 - 1.00 mg/dL Final         Passed - eGFR is 30 or above and within 360 days    GFR calc Af Amer  Date Value Ref Range Status  04/20/2020 >60 >60 mL/min Final   GFR, Estimated  Date Value Ref Range Status  11/14/2021 >60 >60 mL/min Final    Comment:    (NOTE) Calculated using the CKD-EPI Creatinine Equation (2021)          Passed - Completed PHQ-2 or PHQ-9 in the last 360 days      Passed - Valid encounter within last 6 months    Recent Outpatient Visits           6 months ago Type 2 diabetes mellitus with diabetic autonomic neuropathy, with long-term current use of insulin ([place])   [place], [place], MD   10 months ago Type 2 diabetes mellitus with diabetic autonomic neuropathy, with long-term current use of insulin ([place])   [place], Emery, MD   1 year ago Type 2 diabetes mellitus with diabetic autonomic neuropathy, with long-term current use of insulin ([place])   [place], Enobong, MD   1 year ago Adrenal insufficiency [facility])    [place] Charlott Rakes, MD   1 year ago Hospital discharge follow-up   [place], Zelda W, NP

## 2021-12-29 ENCOUNTER — Other Ambulatory Visit: Payer: Self-pay | Admitting: Family Medicine

## 2021-12-29 DIAGNOSIS — Z794 Long term (current) use of insulin: Secondary | ICD-10-CM

## 2021-12-30 NOTE — Telephone Encounter (Signed)
Requested medications are due for refill today.  yes  Requested medications are on the active medications list.  yes  Last refill. 07/28/2021 #120 3 refills  Future visit scheduled.   no  Notes to clinic.  PCP listed is London Pepper.    Requested Prescriptions  Pending Prescriptions Disp Refills   gabapentin (NEURONTIN) 600 MG tablet [Pharmacy Med Name: GABAPENTIN '600MG'$  TABLETS] 120 tablet 3    Sig: TAKE 1 TABLET BY MOUTH EVERY MORNING AND EVERY AFTERNOON THEN TAKE 2 TABLETS BY MOUTH AT NIGHT     Neurology: Anticonvulsants - gabapentin Passed - 12/29/2021 11:55 AM      Passed - Cr in normal range and within 360 days    Creat  Date Value Ref Range Status  02/20/2015 0.74 0.50 - 1.05 mg/dL Final   Creatinine, Ser  Date Value Ref Range Status  11/14/2021 0.59 0.44 - 1.00 mg/dL Final         Passed - Completed PHQ-2 or PHQ-9 in the last 360 days      Passed - Valid encounter within last 12 months    Recent Outpatient Visits           6 months ago Type 2 diabetes mellitus with diabetic autonomic neuropathy, with long-term current use of insulin ([place])   [place], Girard, MD   10 months ago Type 2 diabetes mellitus with diabetic autonomic neuropathy, with long-term current use of insulin ([place])   [place], Lapel, MD   1 year ago Type 2 diabetes mellitus with diabetic autonomic neuropathy, with long-term current use of insulin ([place])   [place], Enobong, MD   1 year ago Adrenal insufficiency [facility])   [place] Charlott Rakes, MD   1 year ago Hospital discharge follow-up   [place], Zelda W, NP

## 2022-01-20 ENCOUNTER — Other Ambulatory Visit: Payer: Self-pay | Admitting: Family Medicine

## 2022-01-20 DIAGNOSIS — E1143 Type 2 diabetes mellitus with diabetic autonomic (poly)neuropathy: Secondary | ICD-10-CM

## 2022-01-20 DIAGNOSIS — Z794 Long term (current) use of insulin: Secondary | ICD-10-CM

## 2022-01-26 ENCOUNTER — Encounter: Payer: Self-pay | Admitting: Neurology

## 2022-01-26 ENCOUNTER — Ambulatory Visit: Payer: Medicare Other | Admitting: Neurology

## 2022-02-01 ENCOUNTER — Emergency Department (HOSPITAL_COMMUNITY): Payer: Medicare Other

## 2022-02-01 ENCOUNTER — Encounter (HOSPITAL_COMMUNITY): Payer: Self-pay

## 2022-02-01 ENCOUNTER — Inpatient Hospital Stay (HOSPITAL_COMMUNITY)
Admission: EM | Admit: 2022-02-01 | Discharge: 2022-02-03 | DRG: 645 | Disposition: A | Discharge status: Home / Self Care | Payer: Medicare Other | Attending: Internal Medicine | Admitting: Internal Medicine

## 2022-02-01 ENCOUNTER — Other Ambulatory Visit: Payer: Self-pay

## 2022-02-01 DIAGNOSIS — E272 Addisonian crisis: Principal | ICD-10-CM | POA: Diagnosis present

## 2022-02-01 DIAGNOSIS — Z818 Family history of other mental and behavioral disorders: Secondary | ICD-10-CM

## 2022-02-01 DIAGNOSIS — E785 Hyperlipidemia, unspecified: Secondary | ICD-10-CM | POA: Diagnosis present

## 2022-02-01 DIAGNOSIS — T383X6A Underdosing of insulin and oral hypoglycemic [antidiabetic] drugs, initial encounter: Secondary | ICD-10-CM | POA: Diagnosis present

## 2022-02-01 DIAGNOSIS — Z79899 Other long term (current) drug therapy: Secondary | ICD-10-CM

## 2022-02-01 DIAGNOSIS — Z794 Long term (current) use of insulin: Secondary | ICD-10-CM

## 2022-02-01 DIAGNOSIS — I10 Essential (primary) hypertension: Secondary | ICD-10-CM | POA: Diagnosis not present

## 2022-02-01 DIAGNOSIS — I959 Hypotension, unspecified: Secondary | ICD-10-CM | POA: Diagnosis present

## 2022-02-01 DIAGNOSIS — Z66 Do not resuscitate: Secondary | ICD-10-CM | POA: Diagnosis present

## 2022-02-01 DIAGNOSIS — E2740 Unspecified adrenocortical insufficiency: Secondary | ICD-10-CM | POA: Diagnosis present

## 2022-02-01 DIAGNOSIS — Z7952 Long term (current) use of systemic steroids: Secondary | ICD-10-CM

## 2022-02-01 DIAGNOSIS — E1165 Type 2 diabetes mellitus with hyperglycemia: Secondary | ICD-10-CM

## 2022-02-01 DIAGNOSIS — R000 Tachycardia, unspecified: Secondary | ICD-10-CM | POA: Diagnosis present

## 2022-02-01 DIAGNOSIS — Z8673 Personal history of transient ischemic attack (TIA), and cerebral infarction without residual deficits: Secondary | ICD-10-CM

## 2022-02-01 DIAGNOSIS — G894 Chronic pain syndrome: Secondary | ICD-10-CM | POA: Diagnosis present

## 2022-02-01 DIAGNOSIS — Z87891 Personal history of nicotine dependence: Secondary | ICD-10-CM

## 2022-02-01 DIAGNOSIS — Z90710 Acquired absence of both cervix and uterus: Secondary | ICD-10-CM

## 2022-02-01 DIAGNOSIS — F32A Depression, unspecified: Secondary | ICD-10-CM | POA: Diagnosis present

## 2022-02-01 DIAGNOSIS — H409 Unspecified glaucoma: Secondary | ICD-10-CM | POA: Diagnosis present

## 2022-02-01 DIAGNOSIS — T380X6A Underdosing of glucocorticoids and synthetic analogues, initial encounter: Secondary | ICD-10-CM | POA: Diagnosis present

## 2022-02-01 DIAGNOSIS — K219 Gastro-esophageal reflux disease without esophagitis: Secondary | ICD-10-CM | POA: Diagnosis present

## 2022-02-01 DIAGNOSIS — Z833 Family history of diabetes mellitus: Secondary | ICD-10-CM

## 2022-02-01 DIAGNOSIS — Z885 Allergy status to narcotic agent status: Secondary | ICD-10-CM

## 2022-02-01 DIAGNOSIS — E1142 Type 2 diabetes mellitus with diabetic polyneuropathy: Secondary | ICD-10-CM | POA: Diagnosis present

## 2022-02-01 DIAGNOSIS — Z7982 Long term (current) use of aspirin: Secondary | ICD-10-CM

## 2022-02-01 LAB — I-STAT CHEM 8, ED
BUN: 9 mg/dL (ref 8–23)
Calcium, Ion: 1.13 mmol/L — ABNORMAL LOW (ref 1.15–1.40)
Chloride: 105 mmol/L (ref 98–111)
Creatinine, Ser: 0.7 mg/dL (ref 0.44–1.00)
Glucose, Bld: 131 mg/dL — ABNORMAL HIGH (ref 70–99)
HCT: 32 % — ABNORMAL LOW (ref 36.0–46.0)
Hemoglobin: 10.9 g/dL — ABNORMAL LOW (ref 12.0–15.0)
Potassium: 4.3 mmol/L (ref 3.5–5.1)
Sodium: 141 mmol/L (ref 135–145)
TCO2: 23 mmol/L (ref 22–32)

## 2022-02-01 LAB — I-STAT VENOUS BLOOD GAS, ED
Acid-Base Excess: 0 mmol/L (ref 0.0–2.0)
Bicarbonate: 24.6 mmol/L (ref 20.0–28.0)
Calcium, Ion: 1.15 mmol/L (ref 1.15–1.40)
HCT: 31 % — ABNORMAL LOW (ref 36.0–46.0)
Hemoglobin: 10.5 g/dL — ABNORMAL LOW (ref 12.0–15.0)
O2 Saturation: 25 %
Potassium: 4.2 mmol/L (ref 3.5–5.1)
Sodium: 141 mmol/L (ref 135–145)
TCO2: 26 mmol/L (ref 22–32)
pCO2, Ven: 40.9 mmHg — ABNORMAL LOW (ref 44–60)
pH, Ven: 7.388 (ref 7.25–7.43)
pO2, Ven: 18 mmHg — CL (ref 32–45)

## 2022-02-01 LAB — CBC WITH DIFFERENTIAL/PLATELET
Abs Immature Granulocytes: 0.02 10*3/uL (ref 0.00–0.07)
Basophils Absolute: 0 10*3/uL (ref 0.0–0.1)
Basophils Relative: 0 %
Eosinophils Absolute: 0.3 10*3/uL (ref 0.0–0.5)
Eosinophils Relative: 5 %
HCT: 33.1 % — ABNORMAL LOW (ref 36.0–46.0)
Hemoglobin: 11.2 g/dL — ABNORMAL LOW (ref 12.0–15.0)
Immature Granulocytes: 0 %
Lymphocytes Relative: 33 %
Lymphs Abs: 1.8 10*3/uL (ref 0.7–4.0)
MCH: 31.1 pg (ref 26.0–34.0)
MCHC: 33.8 g/dL (ref 30.0–36.0)
MCV: 91.9 fL (ref 80.0–100.0)
Monocytes Absolute: 0.4 10*3/uL (ref 0.1–1.0)
Monocytes Relative: 8 %
Neutro Abs: 2.8 10*3/uL (ref 1.7–7.7)
Neutrophils Relative %: 54 %
Platelets: 175 10*3/uL (ref 150–400)
RBC: 3.6 MIL/uL — ABNORMAL LOW (ref 3.87–5.11)
RDW: 12 % (ref 11.5–15.5)
WBC: 5.4 10*3/uL (ref 4.0–10.5)
nRBC: 0 % (ref 0.0–0.2)

## 2022-02-01 LAB — COMPREHENSIVE METABOLIC PANEL
ALT: 34 U/L (ref 0–44)
AST: 33 U/L (ref 15–41)
Albumin: 3.1 g/dL — ABNORMAL LOW (ref 3.5–5.0)
Alkaline Phosphatase: 71 U/L (ref 38–126)
Anion gap: 9 (ref 5–15)
BUN: 10 mg/dL (ref 8–23)
CO2: 25 mmol/L (ref 22–32)
Calcium: 8.2 mg/dL — ABNORMAL LOW (ref 8.9–10.3)
Chloride: 106 mmol/L (ref 98–111)
Creatinine, Ser: 0.78 mg/dL (ref 0.44–1.00)
GFR, Estimated: >60 mL/min (ref >60)
Glucose, Bld: 134 mg/dL — ABNORMAL HIGH (ref 70–99)
Potassium: 4.3 mmol/L (ref 3.5–5.1)
Sodium: 140 mmol/L (ref 135–145)
Total Bilirubin: 0.6 mg/dL (ref 0.3–1.2)
Total Protein: 5.5 g/dL — ABNORMAL LOW (ref 6.5–8.1)

## 2022-02-01 LAB — URINALYSIS, ROUTINE W REFLEX MICROSCOPIC
Bilirubin Urine: NEGATIVE
Glucose, UA: 150 mg/dL — AB
Hgb urine dipstick: NEGATIVE
Ketones, ur: NEGATIVE mg/dL
Leukocytes,Ua: NEGATIVE
Nitrite: NEGATIVE
Protein, ur: NEGATIVE mg/dL
Specific Gravity, Urine: 1.006 (ref 1.005–1.030)
pH: 5 (ref 5.0–8.0)

## 2022-02-01 LAB — TROPONIN I (HIGH SENSITIVITY)
Troponin I (High Sensitivity): 5 ng/L (ref <18)
Troponin I (High Sensitivity): 6 ng/L (ref <18)

## 2022-02-01 LAB — LACTIC ACID, PLASMA
Lactic Acid, Venous: 0.9 mmol/L (ref 0.5–1.9)
Lactic Acid, Venous: 0.5 mmol/L (ref 0.5–1.9)

## 2022-02-01 LAB — CBG MONITORING, ED
Glucose-Capillary: 127 mg/dL — ABNORMAL HIGH (ref 70–99)
Glucose-Capillary: 211 mg/dL — ABNORMAL HIGH (ref 70–99)
Glucose-Capillary: 136 mg/dL — ABNORMAL HIGH (ref 70–99)

## 2022-02-01 LAB — GLUCOSE, CAPILLARY
Glucose-Capillary: 55 mg/dL — ABNORMAL LOW (ref 70–99)
Glucose-Capillary: 178 mg/dL — ABNORMAL HIGH (ref 70–99)

## 2022-02-01 MED ORDER — GABAPENTIN 600 MG PO TABS
600.0000 mg | ORAL_TABLET | Freq: Three times a day (TID) | ORAL | Status: DC
  Administered 2022-02-01: 600 mg via ORAL
  Filled 2022-02-01: qty 1
  Filled 2022-02-01 (×2): qty 2

## 2022-02-01 MED ORDER — ACETAMINOPHEN 325 MG PO TABS
650.0000 mg | ORAL_TABLET | Freq: Four times a day (QID) | ORAL | Status: DC | PRN
  Administered 2022-02-02: 650 mg via ORAL
  Filled 2022-02-01: qty 2

## 2022-02-01 MED ORDER — INSULIN GLARGINE-YFGN 100 UNIT/ML ~~LOC~~ SOLN
25.0000 [IU] | Freq: Every day | SUBCUTANEOUS | Status: DC
  Administered 2022-02-01 – 2022-02-02 (×2): 25 [IU] via SUBCUTANEOUS
  Filled 2022-02-01 (×4): qty 0.25

## 2022-02-01 MED ORDER — INSULIN ASPART 100 UNIT/ML IJ SOLN
0.0000 [IU] | Freq: Three times a day (TID) | INTRAMUSCULAR | Status: DC
  Administered 2022-02-01: 5 [IU] via SUBCUTANEOUS
  Administered 2022-02-01: 2 [IU] via SUBCUTANEOUS
  Administered 2022-02-02: 5 [IU] via SUBCUTANEOUS
  Administered 2022-02-02: 2 [IU] via SUBCUTANEOUS
  Administered 2022-02-02: 8 [IU] via SUBCUTANEOUS

## 2022-02-01 MED ORDER — SODIUM CHLORIDE 0.9 % IV BOLUS
1000.0000 mL | Freq: Once | INTRAVENOUS | Status: AC
  Administered 2022-02-01: 1000 mL via INTRAVENOUS

## 2022-02-01 MED ORDER — DULOXETINE HCL 20 MG PO CPEP
40.0000 mg | ORAL_CAPSULE | Freq: Every day | ORAL | Status: DC
  Administered 2022-02-01 – 2022-02-03 (×3): 40 mg via ORAL
  Filled 2022-02-01 (×3): qty 2

## 2022-02-01 MED ORDER — POLYETHYLENE GLYCOL 3350 17 G PO PACK: 17.0000 g | PACK | Freq: Every day | ORAL | Status: DC | PRN

## 2022-02-01 MED ORDER — MIDODRINE HCL 5 MG PO TABS
2.5000 mg | ORAL_TABLET | Freq: Three times a day (TID) | ORAL | Status: DC
  Administered 2022-02-01 – 2022-02-03 (×7): 2.5 mg via ORAL
  Filled 2022-02-01 (×7): qty 1

## 2022-02-01 MED ORDER — INSULIN ASPART 100 UNIT/ML IJ SOLN
0.0000 [IU] | Freq: Every day | INTRAMUSCULAR | Status: DC
  Administered 2022-02-02: 3 [IU] via SUBCUTANEOUS

## 2022-02-01 MED ORDER — HYDROCORTISONE SOD SUC (PF) 100 MG IJ SOLR
100.0000 mg | Freq: Once | INTRAMUSCULAR | Status: AC
  Administered 2022-02-01: 100 mg via INTRAVENOUS
  Filled 2022-02-01: qty 2

## 2022-02-01 MED ORDER — GABAPENTIN 600 MG PO TABS
600.0000 mg | ORAL_TABLET | Freq: Two times a day (BID) | ORAL | Status: DC
  Administered 2022-02-01 – 2022-02-03 (×4): 600 mg via ORAL
  Filled 2022-02-01 (×6): qty 1

## 2022-02-01 MED ORDER — ATORVASTATIN CALCIUM 40 MG PO TABS
40.0000 mg | ORAL_TABLET | Freq: Every day | ORAL | Status: DC
  Administered 2022-02-01 – 2022-02-03 (×3): 40 mg via ORAL
  Filled 2022-02-01 (×3): qty 1

## 2022-02-01 MED ORDER — LACTATED RINGERS IV SOLN: INTRAVENOUS | Status: DC

## 2022-02-01 MED ORDER — DOCUSATE SODIUM 100 MG PO CAPS
100.0000 mg | ORAL_CAPSULE | Freq: Two times a day (BID) | ORAL | Status: DC
  Administered 2022-02-01 – 2022-02-02 (×3): 100 mg via ORAL
  Filled 2022-02-01 (×4): qty 1

## 2022-02-01 MED ORDER — ENOXAPARIN SODIUM 40 MG/0.4ML IJ SOSY
40.0000 mg | PREFILLED_SYRINGE | INTRAMUSCULAR | Status: DC
Start: 2022-02-01 — End: 2022-02-03
  Administered 2022-02-01 – 2022-02-03 (×3): 40 mg via SUBCUTANEOUS
  Filled 2022-02-01 (×4): qty 0.4

## 2022-02-01 MED ORDER — SODIUM CHLORIDE 0.9% FLUSH
3.0000 mL | Freq: Two times a day (BID) | INTRAVENOUS | Status: DC
  Administered 2022-02-01 – 2022-02-03 (×5): 3 mL via INTRAVENOUS

## 2022-02-01 MED ORDER — HYDROCORTISONE SOD SUC (PF) 100 MG IJ SOLR
100.0000 mg | Freq: Two times a day (BID) | INTRAMUSCULAR | Status: DC
Start: 2022-02-01 — End: 2022-02-03
  Administered 2022-02-01 – 2022-02-03 (×4): 100 mg via INTRAVENOUS
  Filled 2022-02-01 (×7): qty 2

## 2022-02-01 MED ORDER — ACETAMINOPHEN 650 MG RE SUPP: 650.0000 mg | Freq: Four times a day (QID) | RECTAL | Status: DC | PRN

## 2022-02-01 MED ORDER — GABAPENTIN 600 MG PO TABS
1200.0000 mg | ORAL_TABLET | Freq: Every day | ORAL | Status: DC
  Administered 2022-02-01 – 2022-02-02 (×2): 1200 mg via ORAL
  Filled 2022-02-01 (×3): qty 2

## 2022-02-01 MED ORDER — ASPIRIN 81 MG PO TBEC
81.0000 mg | DELAYED_RELEASE_TABLET | Freq: Every day | ORAL | Status: DC
  Administered 2022-02-01 – 2022-02-03 (×3): 81 mg via ORAL
  Filled 2022-02-01 (×3): qty 1

## 2022-02-01 NOTE — ED Triage Notes (Signed)
Pt states that she has been having dizziness and slid into the floor earlier, pt BP was 60/40 with EMS, given fluid and it came up to 446 systolic, BP is now 60 systolic again, pt is pale

## 2022-02-01 NOTE — ED Notes (Signed)
ED TO INPATIENT HANDOFF REPORT  ED Nurse Name and Phone #: Caryl Pina RN [phone]  S Name/Age/Gender [name] [name] 61 y.o. female Room/Bed: 004C/004C  Code Status   Code Status: DNR  Home/SNF/Other Home Patient oriented to: self, place, time, and situation Is this baseline? Yes   Triage Complete: Triage complete  Chief Complaint Adrenal crisis [facility]) [E27.2]  Triage Note Pt states that she has been having dizziness and slid into the floor earlier, pt BP was 60/40 with EMS, given fluid and it came up to 672 systolic, BP is now 60 systolic again, pt is pale   Allergies Allergies  Allergen Reactions   Tramadol Nausea And Vomiting    REACTION: Projectile vomiting    Level of Care/Admitting Diagnosis ED Disposition     ED Disposition  Admit   Condition  --   [place]: [place] [[id number]]  Level of Care: Telemetry Medical [104]  May place patient in observation at [facility] or [place] if equivalent level of care is available:: Yes  Covid Evaluation: Asymptomatic - no recent exposure (last 10 days) testing not required  Diagnosis: Adrenal crisis [facility] [place]) [[id number]]  Admitting Physician: Karmen Bongo [[id number]]  Attending Physician: Karmen Bongo [[id number]]          B Medical/Surgery History Past Medical History:  Diagnosis Date   Cervical disc disorder with radiculopathy of cervical region    Chronic pain syndrome    Degenerative joint disease    Depression dx 1997   Diabetes mellitus, type 2 ([place]) 2011   No insulin   Diabetic polyneuropathy ([place])    GERD (gastroesophageal reflux disease)    Glaucoma    Headache(784.0)    Hypertension    Lab 11/2011:  CXR, EKG, CBC, TSH, BMet, troponin-normal; lipid profile: 188, 131, 36, 126    Lumbar herniated disc    Non-compliance    Pancreatic cyst    Endoscopic aspiration in 09/2009   Pneumonia 08/02/2012   Shingles    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   Torn meniscus     Vertigo    Past Surgical History:  Procedure Laterality Date   ABDOMINAL HYSTERECTOMY     CESAREAN SECTION     ORIF ANKLE FRACTURE Right 02/25/2020   Procedure: OPEN REDUCTION INTERNAL FIXATION (ORIF) ANKLE FRACTURE;  Surgeon: Lovell Sheehan, MD;  Location: [facility] ORS;  Service: Orthopedics;  Laterality: Right;     A IV Location/Drains/Wounds Patient Lines/Drains/Airways Status     Active Line/Drains/Airways     Name Placement date Placement time Site Days   Peripheral IV 02/01/22 20 G Left Antecubital 02/01/22  --  Antecubital  less than 1   External Urinary Catheter 02/01/22  0542  --  less than 1            Intake/Output Last 24 hours  Intake/Output Summary (Last 24 hours) at 02/01/2022 1701 Last data filed at 02/01/2022 0630 Gross per 24 hour  Intake 2000 ml  Output --  Net 2000 ml    Labs/Imaging Results for orders placed or performed during the hospital encounter of 02/01/22 (from the past 48 hour(s))  CBG monitoring, ED     Status: Abnormal   Collection Time: 02/01/22  5:34 AM  Result Value Ref Range   Glucose-Capillary 127 (H) 70 - 99 mg/dL    Comment: Glucose reference range applies only to samples taken after fasting for at least 8 hours.  Comprehensive metabolic  panel     Status: Abnormal   Collection Time: 02/01/22  5:40 AM  Result Value Ref Range   Sodium 140 135 - 145 mmol/L   Potassium 4.3 3.5 - 5.1 mmol/L   Chloride 106 98 - 111 mmol/L   CO2 25 22 - 32 mmol/L   Glucose, Bld 134 (H) 70 - 99 mg/dL    Comment: Glucose reference range applies only to samples taken after fasting for at least 8 hours.   BUN 10 8 - 23 mg/dL   Creatinine, Ser 0.78 0.44 - 1.00 mg/dL   Calcium 8.2 (L) 8.9 - 10.3 mg/dL   Total Protein 5.5 (L) 6.5 - 8.1 g/dL   Albumin 3.1 (L) 3.5 - 5.0 g/dL   AST 33 15 - 41 U/L   ALT 34 0 - 44 U/L   Alkaline Phosphatase 71 38 - 126 U/L   Total Bilirubin 0.6 0.3 - 1.2 mg/dL   GFR, Estimated >60 >60 mL/min    Comment: (NOTE) Calculated  using the CKD-EPI Creatinine Equation (2021)    Anion gap 9 5 - 15    Comment: Performed at [facility], [place] [address] [place], [place] [address]  Troponin I (High Sensitivity)     Status: None   Collection Time: 02/01/22  5:40 AM  Result Value Ref Range   Troponin I (High Sensitivity) 5 <18 ng/L    Comment: (NOTE) Elevated high sensitivity troponin I (hsTnI) values and significant  changes across serial measurements may suggest ACS but many other  chronic and acute conditions are known to elevate hsTnI results.  Refer to the "Links" section for chest pain algorithms and additional  guidance. Performed at [facility], [place] [address], [place], [place] [address]   Lactic acid, plasma     Status: None   Collection Time: 02/01/22  5:40 AM  Result Value Ref Range   Lactic Acid, Venous 0.9 0.5 - 1.9 mmol/L    Comment: Performed at [facility] [address] [place], [place] [address]  CBC with Differential     Status: Abnormal   Collection Time: 02/01/22  5:40 AM  Result Value Ref Range   WBC 5.4 4.0 - 10.5 K/uL   RBC 3.60 (L) 3.87 - 5.11 MIL/uL   Hemoglobin 11.2 (L) 12.0 - 15.0 g/dL   HCT 33.1 (L) 36.0 - 46.0 %   MCV 91.9 80.0 - 100.0 fL   MCH 31.1 26.0 - 34.0 pg   MCHC 33.8 30.0 - 36.0 g/dL   RDW 12.0 11.5 - 15.5 %   Platelets 175 150 - 400 K/uL   nRBC 0.0 0.0 - 0.2 %   Neutrophils Relative % 54 %   Neutro Abs 2.8 1.7 - 7.7 K/uL   Lymphocytes Relative 33 %   Lymphs Abs 1.8 0.7 - 4.0 K/uL   Monocytes Relative 8 %   Monocytes Absolute 0.4 0.1 - 1.0 K/uL   Eosinophils Relative 5 %   Eosinophils Absolute 0.3 0.0 - 0.5 K/uL   Basophils Relative 0 %   Basophils Absolute 0.0 0.0 - 0.1 K/uL   Immature Granulocytes 0 %   Abs Immature Granulocytes 0.02 0.00 - 0.07 K/uL    Comment: Performed at [facility] [address], [place], [place] [address]  I-stat chem 8, ED     Status: Abnormal   Collection Time: 02/01/22  5:43 AM  Result Value Ref Range    Sodium 141 135 - 145  mmol/L   Potassium 4.3 3.5 - 5.1 mmol/L   Chloride 105 98 - 111 mmol/L   BUN 9 8 - 23 mg/dL   Creatinine, Ser 0.70 0.44 - 1.00 mg/dL   Glucose, Bld 131 (H) 70 - 99 mg/dL    Comment: Glucose reference range applies only to samples taken after fasting for at least 8 hours.   Calcium, Ion 1.13 (L) 1.15 - 1.40 mmol/L   TCO2 23 22 - 32 mmol/L   Hemoglobin 10.9 (L) 12.0 - 15.0 g/dL   HCT 32.0 (L) 36.0 - 46.0 %  I-Stat venous blood gas, ED     Status: Abnormal   Collection Time: 02/01/22  5:43 AM  Result Value Ref Range   pH, Ven 7.388 7.25 - 7.43   pCO2, Ven 40.9 (L) 44 - 60 mmHg   pO2, Ven 18 (LL) 32 - 45 mmHg   Bicarbonate 24.6 20.0 - 28.0 mmol/L   TCO2 26 22 - 32 mmol/L   O2 Saturation 25 %   Acid-Base Excess 0.0 0.0 - 2.0 mmol/L   Sodium 141 135 - 145 mmol/L   Potassium 4.2 3.5 - 5.1 mmol/L   Calcium, Ion 1.15 1.15 - 1.40 mmol/L   HCT 31.0 (L) 36.0 - 46.0 %   Hemoglobin 10.5 (L) 12.0 - 15.0 g/dL   Sample type VENOUS    Comment NOTIFIED PHYSICIAN   Lactic acid, plasma     Status: None   Collection Time: 02/01/22  7:33 AM  Result Value Ref Range   Lactic Acid, Venous 0.5 0.5 - 1.9 mmol/L    Comment: Performed at [facility], [place] [address], [place], [place] [address]  Troponin I (High Sensitivity)     Status: None   Collection Time: 02/01/22  7:33 AM  Result Value Ref Range   Troponin I (High Sensitivity) 6 <18 ng/L    Comment: (NOTE) Elevated high sensitivity troponin I (hsTnI) values and significant  changes across serial measurements may suggest ACS but many other  chronic and acute conditions are known to elevate hsTnI results.  Refer to the "Links" section for chest pain algorithms and additional  guidance. Performed at [facility], [place] [address], [place], [place] [address]   CBG monitoring, ED     Status: Abnormal   Collection Time: 02/01/22 12:38 PM  Result Value Ref Range   Glucose-Capillary 211 (H) 70 - 99 mg/dL    Comment:  Glucose reference range applies only to samples taken after fasting for at least 8 hours.  CBG monitoring, ED     Status: Abnormal   Collection Time: 02/01/22  4:38 PM  Result Value Ref Range   Glucose-Capillary 136 (H) 70 - 99 mg/dL    Comment: Glucose reference range applies only to samples taken after fasting for at least 8 hours.   DG Chest Port 1 View  Result Date: 02/01/2022 CLINICAL DATA:  61 year old female with history of syncope. EXAM: PORTABLE CHEST 1 VIEW COMPARISON:  Chest x-ray 06/17/2021. FINDINGS: Lung volumes are normal. No consolidative airspace disease. No pleural effusions. No pneumothorax. No pulmonary nodule or mass noted. Pulmonary vasculature and the cardiomediastinal silhouette are within normal limits. IMPRESSION: No radiographic evidence of acute cardiopulmonary disease. Electronically Signed   By: Vinnie Langton M.D.   On: 02/01/2022 05:42    Pending Labs Unresulted Labs (From admission, onward)     Start     Ordered   02/02/22 9622  Basic metabolic panel  Tomorrow morning,  R        02/01/22 0806   02/02/22 0500  CBC  Tomorrow morning,   R        02/01/22 0806   02/01/22 0522  Urinalysis, Routine w reflex microscopic  Once,   URGENT        02/01/22 0522            Vitals/Pain Today's Vitals   02/01/22 1530 02/01/22 1536 02/01/22 1536 02/01/22 1540  BP: 138/84     Pulse: (!) 114     Resp: 19     Temp:      TempSrc:      SpO2: 94%     Weight:    65.3 kg  Height:    '5\' 6"'$  (1.676 m)  PainSc:  0-No pain 0-No pain     Isolation Precautions No active isolations  Medications Medications  aspirin EC tablet 81 mg (81 mg Oral Given 02/01/22 1038)  atorvastatin (LIPITOR) tablet 40 mg (40 mg Oral Given 02/01/22 1038)  midodrine (PROAMATINE) tablet 2.5 mg (2.5 mg Oral Given 02/01/22 1533)  DULoxetine (CYMBALTA) DR capsule 40 mg (40 mg Oral Given 02/01/22 1038)  insulin glargine-yfgn (SEMGLEE) injection 25 Units (has no administration in time range)   enoxaparin (LOVENOX) injection 40 mg (40 mg Subcutaneous Given 02/01/22 1039)  sodium chloride flush (NS) 0.9 % injection 3 mL (3 mLs Intravenous Given 02/01/22 1039)  lactated ringers infusion ( Intravenous New Bag/Given 02/01/22 1042)  acetaminophen (TYLENOL) tablet 650 mg (has no administration in time range)    Or  acetaminophen (TYLENOL) suppository 650 mg (has no administration in time range)  insulin aspart (novoLOG) injection 0-15 Units (2 Units Subcutaneous Given 02/01/22 1658)  docusate sodium (COLACE) capsule 100 mg (100 mg Oral Given 02/01/22 1039)  polyethylene glycol (MIRALAX / GLYCOLAX) packet 17 g (has no administration in time range)  insulin aspart (novoLOG) injection 0-5 Units (has no administration in time range)  hydrocortisone sodium succinate (SOLU-CORTEF) 100 MG injection 100 mg (has no administration in time range)  gabapentin (NEURONTIN) tablet 600 mg (600 mg Oral Given 02/01/22 1657)    And  gabapentin (NEURONTIN) tablet 1,200 mg (has no administration in time range)  sodium chloride 0.9 % bolus 1,000 mL (0 mLs Intravenous Stopped 02/01/22 0606)  hydrocortisone sodium succinate (SOLU-CORTEF) 100 MG injection 100 mg (100 mg Intravenous Given 02/01/22 0529)  sodium chloride 0.9 % bolus 1,000 mL (0 mLs Intravenous Stopped 02/01/22 0630)    Mobility walks with person assist Low fall risk   Focused Assessments Neuro Assessment Handoff:  Cardiac Rhythm: Sinus tachycardia       Neuro Assessment: Within Defined Limits (Denies complaints at this time) Neuro Checks:      Last Documented NIHSS Modified Score:   Has TPA been given? No If patient is a Neuro Trauma and patient is going to OR before floor call report to [place] nurse: [phone] or [phone]   R Recommendations: See Admitting Provider Note  Report given to:   Additional Notes:

## 2022-02-01 NOTE — H&P (Addendum)
History and Physical    Patient: [name] [name] YTK:[id number] DOB: May 04, 1961 DOA: 02/01/2022 DOS: the patient was seen and examined on 02/01/2022 PCP: London Pepper, MD  Patient coming from: Home - lives with a roommate; NOK:  Daughter, Tanda Rockers, [phone]   Chief Complaint: Dizziness/hypotension  HPI: [name] [name] is a 61 y.o. female with medical history significant of chronic pain; adrenal insufficiency; DM; and HTN presenting with dizziness/hypotension.  She reports that she has been feeling weak and tired for the last few days.  She got extremely dizziness about 3AM and called 911.  She currently feels very tired but otherwise has no complaints.  She did stop taking her hydrocortisone about a week ago; she has adrenal insufficiency and didn't think the medication was doing anything so she just stopped it.    ER Course:  Non-compliant with medications.  Stopped taking hydrocortisone a week ago.  Hypotensive to 60s with EMS -> IVF -> 60s again in ER.  Given 2L IVF, hypotension resolved but still with tachycardia.  Given SoluCortef.       Review of Systems: As mentioned in the history of present illness. All other systems reviewed and are negative. 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 Her smoking use included cigarettes. She has never used smokeless tobacco. She reports that she does not currently use drugs after having used the following drugs: Marijuana. She reports that she does not drink alcohol.  Allergies  Allergen Reactions   Tramadol Nausea And Vomiting    REACTION: Projectile vomiting   Tramadol Hcl     Other reaction(s): vomiting    Family History  Problem Relation Age of Onset   Depression Mother        type 1   Diabetes Mother    Renal Disease Mother    Lung cancer Father    Heart attack Brother        Died age 70 - was told due to massive MI/heart "exploded"   Heart failure Maternal Grandmother        Details not totally clear    Prior to Admission medications   Medication Sig Start Date End Date Taking? Authorizing Provider  Accu-Chek Softclix Lancets lancets Use as instructed tid before meals 06/24/21   Charlott Rakes, MD  acetaminophen (TYLENOL) 325 MG tablet Take  1-2 tablets (325-650 mg total) by mouth every 4 (four) hours as needed for mild pain. 08/27/21   Love, Ivan Anchors, PA-C  aspirin 81 MG EC tablet Take 1 tablet (81 mg total) by mouth daily. Swallow whole. 08/27/21   Love, Ivan Anchors, PA-C  atorvastatin (LIPITOR) 40 MG tablet TAKE 1 TABLET(40 MG) BY MOUTH DAILY Patient taking differently: Take 40 mg by mouth daily. 10/21/21   Kirsteins, Luanna Salk, MD  Blood Glucose Monitoring Suppl (ACCU-CHEK GUIDE) w/Device KIT Use as directed tid 06/24/21   Charlott Rakes, MD  DULoxetine (CYMBALTA) 60 MG capsule TAKE 1 CAPSULE(60 MG) BY MOUTH TWICE DAILY 12/23/21   Charlott Rakes, MD  gabapentin (NEURONTIN) 600 MG tablet TAKE 1 TABLET BY MOUTH EVERY MORNING AND  EVERY AFTERNOON THEN TAKE 2 TABLETS BY MOUTH AT NIGHT 12/30/21   Charlott Rakes, MD  glucose blood (ACCU-CHEK GUIDE) test strip Use as instructed tid 06/24/21   Charlott Rakes, MD  hydrocortisone (CORTEF) 10 MG tablet Take 2 tablets (20 mg total) by mouth every morning AND 1 tablet (10 mg total) every evening. 11/14/21   Patrecia Pour, MD  insulin aspart (NOVOLOG) 100 UNIT/ML injection Inject 6 Units into the skin 3 (three) times daily with meals. Patient taking differently: Inject 0-20 Units into the skin See admin instructions. Tid per sliding scale 08/20/21   Alma Friendly, MD  insulin glargine (LANTUS SOLOSTAR) 100 UNIT/ML Solostar Pen Inject 50 Units into the skin at bedtime. 02/24/18   [provider]  Insulin Pen Needle (PEN NEEDLES) 31G X 8 MM MISC Use as directed 08/12/20   Ghimire, Henreitta Leber, MD  midodrine (PROAMATINE) 2.5 MG tablet Take 1 tablet (2.5 mg total) by mouth 3 (three) times daily. 11/14/21   Patrecia Pour, MD  polycarbophil (FIBERCON) 625 MG tablet Take 1 tablet (625 mg total) by mouth daily. Patient not taking: Reported on 11/13/2021 08/27/21   Love, Ivan Anchors, PA-C  polyethylene glycol (MIRALAX / GLYCOLAX) 17 g packet Take 17 g by mouth daily as needed for mild constipation. 08/20/21   Alma Friendly, MD    Physical Exam: Vitals:   02/01/22 0620 02/01/22 0630 02/01/22 0645 02/01/22 0700  BP: 124/85 130/84 (!) 135/94 134/87  Pulse: (!) 108 (!) 116 (!) 118 (!) 117  Resp: 16 14 (!) 21 14  Temp:      TempSrc:      SpO2: 100% 96% 96% 91%   General:  Appears calm and comfortable and is in NAD, very flat affect Eyes:   EOMI, normal lids, iris ENT:  grossly normal hearing, mildly dry lips & tongue, mildly dry mm; poor dentition Neck:  no LAD, masses or thyromegaly Cardiovascular:  RRR, no m/r/g. No LE edema.  Respiratory:   CTA bilaterally with no wheezes/rales/rhonchi.  Normal respiratory effort. Abdomen:  soft, NT, ND Skin:  no rash or induration seen on  limited exam Musculoskeletal:  grossly normal tone BUE/BLE, good ROM, no bony abnormality Psychiatric:  flat mood and affect, speech fluent and appropriate, AOx3, denies SI Neurologic:  CN 2-12 grossly intact, moves all extremities in coordinated fashion   Radiological Exams on Admission: Independently reviewed - see discussion in A/P where applicable  DG Chest Port 1 View  Result Date: 02/01/2022 CLINICAL DATA:  61 year old female with history of syncope. EXAM: PORTABLE CHEST 1 VIEW COMPARISON:  Chest x-ray 06/17/2021. FINDINGS: Lung volumes are normal. No consolidative airspace disease. No pleural effusions. No pneumothorax. No pulmonary nodule or mass noted. Pulmonary  vasculature and the cardiomediastinal silhouette are within normal limits. IMPRESSION: No radiographic evidence of acute cardiopulmonary disease. Electronically Signed   By: Vinnie Langton M.D.   On: 02/01/2022 05:42    EKG: Independently reviewed.  Sinus tachycardia with rate 107; prolonged QTc 507; no evidence of acute ischemia   Labs on Admission: I have personally reviewed the available labs and imaging studies at the time of the admission.  Pertinent labs:   VBG: 7.388/40.9/24.6 Glucose 134 Albumin 3.1 HS troponin 5 Lactate 0.9 WBC 5.4 Hgb 11.2    Assessment and Plan: Principal Problem:   Adrenal crisis ([place]) Active Problems:   Adrenal insufficiency ([place])   Uncontrolled type 2 diabetes mellitus with hyperglycemia, with long-term current use of insulin (HCC)   Depression   Hypotension   DNR (do not resuscitate)    Adrenal crisis -Patient with known h/o adrenal insufficiency, chronically prescribed hydrocortisone -She stopped taking it last week because she didn't see any effects from it -She has been increasingly weak and dizzy -Marked hypotension on arrival, now better after fluids and a dose of IV SoluCortef -Ongoing tachycardia -Will observe overnight on telemetry -Resume hydrocortisone (would  give 50 mg IV q6 but it is on national backorder so I have asked pharmacy to assist and they recommend 100 mg IV q12h) -Will need to resume PO hydrocortisone at the time of discharge -She last saw Dr. Buddy Duty on 4/28 and likely needs outpatient f/u  Chronic pain -Reported h/o -Patient reviewed in [place] and she is not receiving chronic pain medications -She did complain to Dr. Ralene Bathe about shoulder pain but did not mention this to me -Continue Neurontin  DM -Recent A1c was 13.3, indicating very poor control -Continue glargine but reduce dose by 50% due to presumed poor PO intake (can increase back to home dose when appropriate) -Cover with moderate-scale SSI   Hypotension -Continue Midodrine  HLD -Continue Lipitor  Depression -She last saw her PCP for this issue on 7/6 -May be contributing to current need for hospitalization -Continue Cymbalta -Denies SI  DNR -I have discussed code status with the patient and she would not desire resuscitation and would prefer to die a natural death should that situation arise. -She will need a gold out of facility DNR form at the time of discharge     FYI - RN recommends outpatient sleep study, as she appears to be having recurrent apneic episodes with transient desaturation during sleep.   Advance Care Planning:   Code Status: DNR   Consults: None  DVT Prophylaxis: Lovenox  Family Communication: None present; I spoke with her daughter by telephone at the time of admission  Severity of Illness: The appropriate patient status for this patient is OBSERVATION. Observation status is judged to be reasonable and necessary in order to provide the required intensity of service to ensure the patient's safety. The patient's presenting symptoms, physical exam findings, and initial radiographic and laboratory data in the context of their medical condition is felt to place them at decreased risk for further clinical deterioration. Furthermore, it is  anticipated that the patient will be medically stable for discharge from the hospital within 2 midnights of admission.   Author: Karmen Bongo, MD 02/01/2022 8:20 AM  For on call review [email or web address].

## 2022-02-01 NOTE — ED Provider Notes (Signed)
[facility] EMERGENCY DEPARTMENT Provider Note   CSN: [id number] Arrival date & time: 02/01/22  0449     History  Chief Complaint  Patient presents with   Hypotension   Dizziness    [name] [name] is a 61 y.o. female.  The history is provided by the patient and medical records.  Dizziness [name] [name] is a 61 y.o. female who presents to the Emergency Department complaining of dizziness.  She presents to the emergency department from home by EMS for evaluation of dizziness.  She states that she has not felt well for the last 2 days with fairly constant dizziness and lightheadedness that is worse when she goes to get up.  No associated fevers.  She does have nausea and feels generally unwell.  She reports intermittent throbbing to the left shoulder for 1 month.  Earlier this morning she did have some sharp left-sided abdominal pain, which has since resolved.  No vomiting, dysuria, hematochezia or melena.  She states that she stopped taking her hydrocortisone about a week ago because she did not feel like it was doing anything for her.   She has a history of diabetes, adrenal insufficiency, orthostasis.  She states that she is not consistent on taking her diabetic medications.  Home Medications Prior to Admission medications   Medication Sig Start Date End Date Taking? Authorizing Provider  Accu-Chek Softclix Lancets lancets Use as instructed tid before meals 06/24/21   Charlott Rakes, MD  acetaminophen (TYLENOL) 325 MG tablet Take 1-2 tablets (325-650 mg total) by mouth every 4 (four) hours as needed for mild pain. 08/27/21   Love, Ivan Anchors, PA-C  aspirin 81 MG EC tablet Take 1 tablet (81 mg total) by mouth daily. Swallow whole. 08/27/21   Love, Ivan Anchors, PA-C  atorvastatin (LIPITOR) 40 MG tablet TAKE 1 TABLET(40 MG) BY MOUTH DAILY Patient taking differently: Take 40 mg by mouth daily. 10/21/21   Kirsteins, Luanna Salk, MD  Blood Glucose Monitoring Suppl (ACCU-CHEK GUIDE)  w/Device KIT Use as directed tid 06/24/21   Charlott Rakes, MD  DULoxetine (CYMBALTA) 60 MG capsule TAKE 1 CAPSULE(60 MG) BY MOUTH TWICE DAILY 12/23/21   Charlott Rakes, MD  gabapentin (NEURONTIN) 600 MG tablet TAKE 1 TABLET BY MOUTH EVERY MORNING AND EVERY AFTERNOON THEN TAKE 2 TABLETS BY MOUTH AT NIGHT 12/30/21   Charlott Rakes, MD  glucose blood (ACCU-CHEK GUIDE) test strip Use as instructed tid 06/24/21   Charlott Rakes, MD  hydrocortisone (CORTEF) 10 MG tablet Take 2 tablets (20 mg total) by mouth every morning AND 1 tablet (10 mg total) every evening. 11/14/21   Patrecia Pour, MD  insulin aspart (NOVOLOG) 100 UNIT/ML injection Inject 6 Units into the skin 3 (three) times daily with meals. Patient taking differently: Inject 0-20 Units into the skin See admin instructions. Tid per sliding scale 08/20/21   Alma Friendly, MD  insulin glargine (LANTUS SOLOSTAR) 100 UNIT/ML Solostar Pen Inject 50 Units into the skin at bedtime. 02/24/18   [provider]  Insulin Pen Needle (PEN NEEDLES) 31G X 8 MM MISC Use as directed 08/12/20   Ghimire, Henreitta Leber, MD  midodrine (PROAMATINE) 2.5 MG tablet Take 1 tablet (2.5 mg total) by mouth 3 (three) times daily. 11/14/21   Patrecia Pour, MD  polycarbophil (FIBERCON) 625 MG tablet Take 1 tablet (625 mg total) by mouth daily. Patient not taking: Reported on 11/13/2021 08/27/21   Love, Ivan Anchors, PA-C  polyethylene glycol [facility] / Floria Raveling)  17 g packet Take 17 g by mouth daily as needed for mild constipation. 08/20/21   Ezenduka, Nkeiruka J, MD      Allergies    Tramadol and Tramadol hcl    Review of Systems   Review of Systems  Neurological:  Positive for dizziness.  All other systems reviewed and are negative.   Physical Exam Updated Vital Signs BP 134/87   Pulse (!) 117   Temp 97.8 F (36.6 C) (Oral)   Resp 14   SpO2 91%  Physical Exam Vitals and nursing note reviewed.  Constitutional:      Appearance: She is well-developed.  HENT:      Head: Normocephalic and atraumatic.  Cardiovascular:     Rate and Rhythm: Regular rhythm. Tachycardia present.     Heart sounds: No murmur heard. Pulmonary:     Effort: Pulmonary effort is normal. No respiratory distress.     Breath sounds: Normal breath sounds.  Abdominal:     Palpations: Abdomen is soft.     Tenderness: There is no abdominal tenderness. There is no guarding or rebound.  Musculoskeletal:        General: No swelling or tenderness.     Comments: Chronic deformity to the right ankle  Skin:    General: Skin is warm and dry.     Coloration: Skin is pale.  Neurological:     Mental Status: She is alert and oriented to person, place, and time.     Comments: 5 out of 5 strength in all 4 extremities.  Psychiatric:        Behavior: Behavior normal.     ED Results / Procedures / Treatments   Labs (all labs ordered are listed, but only abnormal results are displayed) Labs Reviewed  COMPREHENSIVE METABOLIC PANEL - Abnormal; Notable for the following components:      Result Value   Glucose, Bld 134 (*)    Calcium 8.2 (*)    Total Protein 5.5 (*)    Albumin 3.1 (*)    All other components within normal limits  CBC WITH DIFFERENTIAL/PLATELET - Abnormal; Notable for the following components:   RBC 3.60 (*)    Hemoglobin 11.2 (*)    HCT 33.1 (*)    All other components within normal limits  I-STAT CHEM 8, ED - Abnormal; Notable for the following components:   Glucose, Bld 131 (*)    Calcium, Ion 1.13 (*)    Hemoglobin 10.9 (*)    HCT 32.0 (*)    All other components within normal limits  I-STAT VENOUS BLOOD GAS, ED - Abnormal; Notable for the following components:   pCO2, Ven 40.9 (*)    pO2, Ven 18 (*)    HCT 31.0 (*)    Hemoglobin 10.5 (*)    All other components within normal limits  CBG MONITORING, ED - Abnormal; Notable for the following components:   Glucose-Capillary 127 (*)    All other components within normal limits  LACTIC ACID, PLASMA  LACTIC ACID,  PLASMA  URINALYSIS, ROUTINE W REFLEX MICROSCOPIC  TROPONIN I (HIGH SENSITIVITY)  TROPONIN I (HIGH SENSITIVITY)    EKG EKG Interpretation  Date/Time:  Sunday February 01 2022 05:28:10 EDT Ventricular Rate:  107 PR Interval:  145 QRS Duration: 83 QT Interval:  391 QTC Calculation: 522 R Axis:   88 Text Interpretation: Sinus tachycardia Borderline right axis deviation Prolonged QT interval Confirmed by Rees, Elizabeth ([id number]) on 02/01/2022 5:31:39 AM  Radiology DG Chest Port 1 View    Result Date: 02/01/2022 CLINICAL DATA:  61-year-old female with history of syncope. EXAM: PORTABLE CHEST 1 VIEW COMPARISON:  Chest x-ray 06/17/2021. FINDINGS: Lung volumes are normal. No consolidative airspace disease. No pleural effusions. No pneumothorax. No pulmonary nodule or mass noted. Pulmonary vasculature and the cardiomediastinal silhouette are within normal limits. IMPRESSION: No radiographic evidence of acute cardiopulmonary disease. Electronically Signed   By: Daniel  Entrikin M.D.   On: 02/01/2022 05:42    Procedures Procedures   CRITICAL CARE Performed by: Elizabeth Rees   Total critical care time: 35 minutes  Critical care time was exclusive of separately billable procedures and treating other patients.  Critical care was necessary to treat or prevent imminent or life-threatening deterioration.  Critical care was time spent personally by me on the following activities: development of treatment plan with patient and/or surrogate as well as nursing, discussions with consultants, evaluation of patient's response to treatment, examination of patient, obtaining history from patient or surrogate, ordering and performing treatments and interventions, ordering and review of laboratory studies, ordering and review of radiographic studies, pulse oximetry and re-evaluation of patient's condition.  Medications Ordered in ED Medications  sodium chloride 0.9 % bolus 1,000 mL (0 mLs Intravenous Stopped  02/01/22 0606)  hydrocortisone sodium succinate (SOLU-CORTEF) 100 MG injection 100 mg (100 mg Intravenous Given 02/01/22 0529)  sodium chloride 0.9 % bolus 1,000 mL (0 mLs Intravenous Stopped 02/01/22 0630)    ED Course/ Medical Decision Making/ A&P                           Medical Decision Making Amount and/or Complexity of Data Reviewed Labs: ordered. Radiology: ordered.  Risk Prescription drug management.   Patient with history of diabetes, adrenal insufficiency, CVA here for evaluation of malaise for 2 days as well as dizziness.  She is tachycardic on ED evaluation, hypotensive on ED presentation.  Hypotension quickly improved with IV fluids but her tachycardia persists.  She does state that she has not been taking her hydrocortisone because she did not feel like it was doing anything for her.  She was treated with Solu-Cortef for concern for adrenal crisis.  Patient received 2 L IV fluids, persistent lightheadedness and persistent tachycardia, worsening to 120s.  Her hypotension has resolved.  Labs without significant electrolyte abnormality, significant anemia or evidence of DKA..  Given patient's severe symptoms recommend observation admission.  Medicine consulted for admission.        Final Clinical Impression(s) / ED Diagnoses Final diagnoses:  None    Rx / DC Orders ED Discharge Orders     None         Rees, Elizabeth, MD 02/01/22 0724  

## 2022-02-01 NOTE — ED Provider Triage Note (Signed)
Emergency Medicine Provider Triage Evaluation Note  [name] [name] , a 61 y.o. female  was evaluated in triage.  Pt complains of lightheadedness which began this morning and has persisted. Worse with position change, namely when going to a standing position. No syncope, chest pain, SOB. BP 60/40 with EMS. Given IVF en route. Hx of adrenal insufficiency, but has been off her steroids as she doesn't feel like they "did anything". Does report compliance with midodrine. Multiple past admission for orthostatic hypotension.  Review of Systems  Positive: As above Negative: As above  Physical Exam  BP (!) 63/48 (BP Location: Left Arm)   Pulse (!) 109   Temp 97.8 F (36.6 C) (Oral)   Resp 17   SpO2 100%  Gen:   Awake, no distress   Resp:  Normal effort  MSK:   Moves extremities without difficulty  Other:  Tachycardia. Pale appearing.  Medical Decision Making  Medically screening exam initiated at 5:07 AM.  Appropriate orders placed.  [name] was informed that the remainder of the evaluation will be completed by another provider, this initial triage assessment does not replace that evaluation, and the importance of remaining in the ED until their evaluation is complete.  Hypotension - transferred to trauma A given degree of hypotension in triage. 2L IVF initiated.   [name], PA-C 02/01/22 3536

## 2022-02-01 NOTE — Progress Notes (Signed)
Pt blood sugar is 55, pt is eating and receive orange juice. Will recheck blood sugar in 15 minutes.

## 2022-02-02 DIAGNOSIS — G894 Chronic pain syndrome: Secondary | ICD-10-CM | POA: Diagnosis present

## 2022-02-02 DIAGNOSIS — R000 Tachycardia, unspecified: Secondary | ICD-10-CM | POA: Diagnosis present

## 2022-02-02 DIAGNOSIS — Z79899 Other long term (current) drug therapy: Secondary | ICD-10-CM | POA: Diagnosis not present

## 2022-02-02 DIAGNOSIS — E1165 Type 2 diabetes mellitus with hyperglycemia: Secondary | ICD-10-CM | POA: Diagnosis present

## 2022-02-02 DIAGNOSIS — Z90710 Acquired absence of both cervix and uterus: Secondary | ICD-10-CM | POA: Diagnosis not present

## 2022-02-02 DIAGNOSIS — F32A Depression, unspecified: Secondary | ICD-10-CM | POA: Diagnosis present

## 2022-02-02 DIAGNOSIS — E785 Hyperlipidemia, unspecified: Secondary | ICD-10-CM | POA: Diagnosis present

## 2022-02-02 DIAGNOSIS — T380X6A Underdosing of glucocorticoids and synthetic analogues, initial encounter: Secondary | ICD-10-CM | POA: Diagnosis present

## 2022-02-02 DIAGNOSIS — Z818 Family history of other mental and behavioral disorders: Secondary | ICD-10-CM | POA: Diagnosis not present

## 2022-02-02 DIAGNOSIS — I959 Hypotension, unspecified: Secondary | ICD-10-CM | POA: Diagnosis present

## 2022-02-02 DIAGNOSIS — E272 Addisonian crisis: Secondary | ICD-10-CM | POA: Diagnosis present

## 2022-02-02 DIAGNOSIS — Z66 Do not resuscitate: Secondary | ICD-10-CM | POA: Diagnosis present

## 2022-02-02 DIAGNOSIS — Z885 Allergy status to narcotic agent status: Secondary | ICD-10-CM | POA: Diagnosis not present

## 2022-02-02 DIAGNOSIS — Z8673 Personal history of transient ischemic attack (TIA), and cerebral infarction without residual deficits: Secondary | ICD-10-CM | POA: Diagnosis not present

## 2022-02-02 DIAGNOSIS — K219 Gastro-esophageal reflux disease without esophagitis: Secondary | ICD-10-CM | POA: Diagnosis present

## 2022-02-02 DIAGNOSIS — Z87891 Personal history of nicotine dependence: Secondary | ICD-10-CM | POA: Diagnosis not present

## 2022-02-02 DIAGNOSIS — Z794 Long term (current) use of insulin: Secondary | ICD-10-CM | POA: Diagnosis not present

## 2022-02-02 DIAGNOSIS — T383X6A Underdosing of insulin and oral hypoglycemic [antidiabetic] drugs, initial encounter: Secondary | ICD-10-CM | POA: Diagnosis present

## 2022-02-02 DIAGNOSIS — Z7982 Long term (current) use of aspirin: Secondary | ICD-10-CM | POA: Diagnosis not present

## 2022-02-02 DIAGNOSIS — Z833 Family history of diabetes mellitus: Secondary | ICD-10-CM | POA: Diagnosis not present

## 2022-02-02 DIAGNOSIS — I10 Essential (primary) hypertension: Secondary | ICD-10-CM | POA: Diagnosis not present

## 2022-02-02 DIAGNOSIS — Z7952 Long term (current) use of systemic steroids: Secondary | ICD-10-CM | POA: Diagnosis not present

## 2022-02-02 DIAGNOSIS — E1142 Type 2 diabetes mellitus with diabetic polyneuropathy: Secondary | ICD-10-CM | POA: Diagnosis present

## 2022-02-02 DIAGNOSIS — H409 Unspecified glaucoma: Secondary | ICD-10-CM | POA: Diagnosis present

## 2022-02-02 LAB — BASIC METABOLIC PANEL
Anion gap: 6 (ref 5–15)
BUN: 8 mg/dL (ref 8–23)
CO2: 24 mmol/L (ref 22–32)
Calcium: 8.7 mg/dL — ABNORMAL LOW (ref 8.9–10.3)
Chloride: 108 mmol/L (ref 98–111)
Creatinine, Ser: 0.62 mg/dL (ref 0.44–1.00)
GFR, Estimated: >60 mL/min (ref >60)
Glucose, Bld: 175 mg/dL — ABNORMAL HIGH (ref 70–99)
Potassium: 3.3 mmol/L — ABNORMAL LOW (ref 3.5–5.1)
Sodium: 138 mmol/L (ref 135–145)

## 2022-02-02 LAB — CBC
HCT: 34.3 % — ABNORMAL LOW (ref 36.0–46.0)
Hemoglobin: 11.8 g/dL — ABNORMAL LOW (ref 12.0–15.0)
MCH: 31 pg (ref 26.0–34.0)
MCHC: 34.4 g/dL (ref 30.0–36.0)
MCV: 90 fL (ref 80.0–100.0)
Platelets: 183 10*3/uL (ref 150–400)
RBC: 3.81 MIL/uL — ABNORMAL LOW (ref 3.87–5.11)
RDW: 12.1 % (ref 11.5–15.5)
WBC: 6 10*3/uL (ref 4.0–10.5)
nRBC: 0 % (ref 0.0–0.2)

## 2022-02-02 LAB — GLUCOSE, CAPILLARY
Glucose-Capillary: 208 mg/dL — ABNORMAL HIGH (ref 70–99)
Glucose-Capillary: 136 mg/dL — ABNORMAL HIGH (ref 70–99)
Glucose-Capillary: 297 mg/dL — ABNORMAL HIGH (ref 70–99)
Glucose-Capillary: 251 mg/dL — ABNORMAL HIGH (ref 70–99)

## 2022-02-02 LAB — CORTISOL: Cortisol, Plasma: 15 ug/dL

## 2022-02-02 MED ORDER — POTASSIUM CHLORIDE CRYS ER 20 MEQ PO TBCR
40.0000 meq | EXTENDED_RELEASE_TABLET | Freq: Once | ORAL | Status: AC
  Administered 2022-02-02: 40 meq via ORAL
  Filled 2022-02-02: qty 2

## 2022-02-02 MED ORDER — INSULIN ASPART 100 UNIT/ML IJ SOLN
0.0000 [IU] | Freq: Three times a day (TID) | INTRAMUSCULAR | Status: DC
  Administered 2022-02-03: 5 [IU] via SUBCUTANEOUS

## 2022-02-02 MED ORDER — LACTATED RINGERS IV SOLN: INTRAVENOUS | Status: AC

## 2022-02-02 NOTE — Inpatient Diabetes Management (Signed)
Inpatient Diabetes Program Recommendations  AACE/ADA: New Consensus Statement on Inpatient Glycemic Control (2015)  Target Ranges:  Prepandial:   less than 140 mg/dL      Peak postprandial:   less than 180 mg/dL (1-2 hours)      Critically ill patients:  140 - 180 mg/dL   Lab Results  Component Value Date   GLUCAP 208 (H) 02/02/2022   HGBA1C 13.3 (H) 08/17/2021    Review of Glycemic Control  Latest Reference Range & Units 02/01/22 05:34 02/01/22 12:38 02/01/22 16:38 02/01/22 19:03 02/01/22 21:10 02/02/22 07:34  Glucose-Capillary 70 - 99 mg/dL 127 (H) 211 (H) 136 (H) 55 (L) 178 (H) 208 (H)   Diabetes history: DM 2 Outpatient Diabetes medications: Humalog 0-20 units tid, Lantus 50 units qhs Current orders for Inpatient glycemic control:  Semglee 25 units qhs Novolog 0-15 units tid + hs  Solucortef 100 mg bid  Inpatient Diabetes Program Recommendations:    Pt had hypoglycemia due to Novolog doses, Fasting is high.  -  Reduce Novolog Correction to 0-9 units tid + hs  Thanks,  Tama Headings RN, MSN, BC-ADM Inpatient Diabetes Coordinator Team Pager [phone] (8a-5p)

## 2022-02-02 NOTE — Progress Notes (Signed)
   02/02/22 1202  Assess: MEWS Score  Temp 98.5 F (36.9 C)  BP 132/86  MAP (mmHg) 100  Pulse Rate (!) 112  Resp 16  SpO2 97 %  O2 Device Room Air  Assess: MEWS Score  MEWS Temp 0  MEWS Systolic 0  MEWS Pulse 2  MEWS RR 0  MEWS LOC 0  MEWS Score 2  MEWS Score Color Yellow  Assess: if the MEWS score is Yellow or Red  Were vital signs taken at a resting state? Yes  Focused Assessment No change from prior assessment  Does the patient meet 2 or more of the SIRS criteria? Yes  Does the patient have a confirmed or suspected source of infection? No  MEWS guidelines implemented *See Row Information* Yes  Treat  MEWS Interventions Administered scheduled meds/treatments;Other (Comment) (new oreders from [facility])  Pain Scale 0-10  Pain Score 0  Patients Stated Pain Goal 0  Notify: Provider  Provider response See new orders  Date of Provider Response 02/02/22  Time of Provider Response 1205  Document  Patient Outcome Other (Comment)  Assess: SIRS CRITERIA  SIRS Temperature  0  SIRS Pulse 1  SIRS Respirations  0  SIRS WBC 1  SIRS Score Sum  2

## 2022-02-02 NOTE — Progress Notes (Signed)
   02/02/22 0836  Assess: MEWS Score  Temp 97.9 F (36.6 C)  BP 110/63  MAP (mmHg) 76  Pulse Rate (!) 113  Resp 16  SpO2 98 %  O2 Device Room Air  Assess: MEWS Score  MEWS Temp 0  MEWS Systolic 0  MEWS Pulse 2  MEWS RR 0  MEWS LOC 0  MEWS Score 2  MEWS Score Color Yellow  Assess: if the MEWS score is Yellow or Red  Were vital signs taken at a resting state? Yes  Focused Assessment No change from prior assessment  Does the patient meet 2 or more of the SIRS criteria? Yes  Does the patient have a confirmed or suspected source of infection? No  MEWS guidelines implemented *See Row Information* Yes  Treat  MEWS Interventions Administered scheduled meds/treatments  Pain Scale 0-10  Pain Score 0  Patients Stated Pain Goal 0  Take Vital Signs  Increase Vital Sign Frequency  Yellow: Q 2hr X 2 then Q 4hr X 2, if remains yellow, continue Q 4hrs  Notify: Charge Nurse/RN  Name of Charge Nurse/RN Notified Miriam RN  Date Charge Nurse/RN Notified 02/02/22  Time Charge Nurse/RN Notified 0900  Notify: Provider  Provider Name/Title George Hugh  Date Provider Notified 02/02/22  Time Provider Notified [phone]  Method of Notification Page  Notification Reason Other (Comment)  Provider response See new orders;No new orders  Date of Provider Response 02/02/22  Time of Provider Response 0915  Document  Patient Outcome Stabilized after interventions  Assess: SIRS CRITERIA  SIRS Temperature  0  SIRS Pulse 1  SIRS Respirations  0  SIRS WBC 0  SIRS Score Sum  1

## 2022-02-02 NOTE — TOC Initial Note (Signed)
Transition of Care [facility]) - Initial/Assessment Note    Patient Details  Name: [name] [name] MRN: [id number] Date of Birth: 07/06/1961  Transition of Care [facility]) CM/SW Contact:    Ninfa Meeker, RN Phone Number: 02/02/2022, 10:00 AM  Clinical Narrative:         Transition of Care Department [facility]) has reviewed patient and no TOC needs have been identified at this time. We will continue to monitor patient advancement through Interdisciplinary progressions. If new patient transition needs arise, please place a consult.                 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/20/2021   Diabetic neuropathy ([place]) 10/20/2021   Other specified abnormal findings of blood chemistry 10/20/2021   Blurry vision 10/20/2021   hx of Cerebrovascular accident  10/20/2021   Orthostatic dizziness 10/20/2021   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   Visual field constriction of right eye 08/16/2021   DNR (do not resuscitate) 08/16/2021   Current severe episode of major depressive disorder without psychotic features ([place]) 09/26/2020   DKA (diabetic ketoacidosis) ([place]) 07/29/2020   Dehydration    Hyperglycemia    Preoperative clearance 02/25/2020   Fracture of medial malleolus, right, closed 02/24/2020   Hyperglycemia due to type 2 diabetes mellitus ([place]) 02/24/2020   Autonomic dysfunction 02/24/2020   Fall 02/24/2020   Adrenal insufficiency ([place]) 09/07/2019   Recurrent syncope 09/06/2019    Sinus tachycardia 09/06/2019   Hypokalemia 09/06/2019   Pulmonary nodules 09/06/2019   Orthostatic hypotension    Syncope and collapse 06/30/2019   Hypotension 06/29/2019   Chronic pain syndrome 08/24/2017   Diabetic polyneuropathy associated with type 2 diabetes mellitus ([place]) 08/24/2017   Restless leg syndrome 08/24/2017   Vertigo 04/09/2016   Cervical disc disorder with radiculopathy of cervical region 02/20/2015   Neck muscle spasm 10/23/2014   Healthcare maintenance 10/23/2014   Depression 11/25/2013   Uncontrolled type 2 diabetes mellitus with hyperglycemia, with long-term current use of insulin ([place])    PCP:  London Pepper, MD Pharmacy:   [facility] [place], [address] [place] [address] Phone: [phone] Fax: [phone]     Social Determinants of Health (SDOH) Interventions    Readmission Risk Interventions     No data to display

## 2022-02-02 NOTE — Progress Notes (Signed)
   02/02/22 1701  Assess: MEWS Score  BP 124/71  MAP (mmHg) 87  Pulse Rate (!) 125  Resp 16  SpO2 98 %  O2 Device Room Air  Assess: MEWS Score  MEWS Temp 0  MEWS Systolic 0  MEWS Pulse 2  MEWS RR 0  MEWS LOC 0  MEWS Score 2  MEWS Score Color Yellow  Treat  Pain Scale 0-10  Pain Score 0  Patients Stated Pain Goal 0  Document  Patient Outcome Stabilized after interventions  Assess: SIRS CRITERIA  SIRS Temperature  0  SIRS Pulse 1  SIRS Respirations  0  SIRS WBC 1  SIRS Score Sum  2

## 2022-02-02 NOTE — Progress Notes (Addendum)
  Progress Note   Patient: [name] [name] GEX:[id number] DOB: 1961-02-11 DOA: 02/01/2022     0 DOS: the patient was seen and examined on 02/02/2022   Brief hospital course: 61 year old female with PMH of diabetes mellitus type 2 and adrenal insufficiency who presented to the ED on 7/16 with dizziness and hypotension.  She recently stopped taking her hydrocortisone.  Assessment and Plan: Adrenal Insufficiency: BP is stable on Midodrine.  Glucose is stable. HR remains elevated from 108-125 beats/min.   - Continue Hydrocortisone at 100 mg q12h (pharmacy is a shortage so we are utilizing BID dosing).  Begin taper tomorrow. - Follow up with Endocrine Dr. Buddy Duty outpatient.  Tachycardia and Chills: - Check EKG, electrolytes, and cultures. - The heart rate should improve with steroids.  Qtc today is 452 ms.  Monitor on telemetry for arrhythmias.  Hypotension: - Continue Midodrine.  Diabetes Mellitus Type 2: A1C is 13.3%.  Glucose ~ 292 while on steroids. - Continue Lantus 25 units nightly. - Change Lispro Ssi to low dose with POC glucose ACHS.  Chronic Pain Medications: - Continue Neurontin for now.  Hyperlipidemia: - Statin.  MDD: - Cymbalta.  Code Status: - DNR   Subjective: [name] [name] is resting comfortably this morning. She seems a little down in spirits. She denies any chest pain or shortness of breath. She denies any nausea or vomiting.   BP is stable on Midodrine.  Glucose is stable. HR remains elevated from 108-125 beats/min.  Physical Exam: Vitals:   02/02/22 0836 02/02/22 1202 02/02/22 1328 02/02/22 1701  BP: 110/63 132/86 134/73 124/71  Pulse: (!) 113 (!) 112 (!) 113 (!) 125  Resp: '16 16 16 16  '$ Temp: 97.9 F (36.6 C) 98.5 F (36.9 C) 98.4 F (36.9 C)   TempSrc: Oral Oral Oral   SpO2: 98% 97% 97% 98%  Weight:      Height:       Examination: General exam: chronically ill appearing, fatigued, pale HEENT: NCAT, PERRL Respiratory system: CTAB no  WRR Cardiovascular system: Did not appreciate a murmur, regular, No JVD. Gastrointestinal system: No flank pain, Abdomen soft, NT,ND, BS+. Nervous System: No focal deficits. Extremities: No edema, distal peripheral pulses palpable.  Skin: No rashes, No bruises, No icterus. MSK: muscle tone wnl  Data Reviewed: DG Chest Port 1 View CLINICAL DATA:  61 year old female with history of syncope.  EXAM: PORTABLE CHEST 1 VIEW  COMPARISON:  Chest x-ray 06/17/2021.  FINDINGS: Lung volumes are normal. No consolidative airspace disease. No pleural effusions. No pneumothorax. No pulmonary nodule or mass noted. Pulmonary vasculature and the cardiomediastinal silhouette are within normal limits.  IMPRESSION: No radiographic evidence of acute cardiopulmonary disease.  Electronically Signed   By: Vinnie Langton M.D.   On: 02/01/2022 05:42    Family Communication: I spoke with patient twice and daughter over the phone..  Disposition: Status is: Inpatient Remains inpatient appropriate because: need for IV steroids with hypotension.  Planned Discharge Destination: Home DVT prophylaxis: Lovenox  Time spent: >30 minutes  Author: George Hugh, MD 02/02/2022 5:19 PM  For on call review [email or web address].

## 2022-02-03 DIAGNOSIS — E272 Addisonian crisis: Secondary | ICD-10-CM | POA: Diagnosis not present

## 2022-02-03 LAB — BASIC METABOLIC PANEL
Anion gap: 7 (ref 5–15)
BUN: 7 mg/dL — ABNORMAL LOW (ref 8–23)
CO2: 25 mmol/L (ref 22–32)
Calcium: 8.9 mg/dL (ref 8.9–10.3)
Chloride: 107 mmol/L (ref 98–111)
Creatinine, Ser: 0.64 mg/dL (ref 0.44–1.00)
GFR, Estimated: >60 mL/min (ref >60)
Glucose, Bld: 220 mg/dL — ABNORMAL HIGH (ref 70–99)
Potassium: 3.6 mmol/L (ref 3.5–5.1)
Sodium: 139 mmol/L (ref 135–145)

## 2022-02-03 LAB — TSH: TSH: 0.567 u[IU]/mL (ref 0.350–4.500)

## 2022-02-03 LAB — T4, FREE: Free T4: 0.97 ng/dL (ref 0.61–1.12)

## 2022-02-03 LAB — GLUCOSE, CAPILLARY: Glucose-Capillary: 292 mg/dL — ABNORMAL HIGH (ref 70–99)

## 2022-02-03 MED ORDER — INSULIN GLARGINE-YFGN 100 UNIT/ML ~~LOC~~ SOLN
30.0000 [IU] | Freq: Every day | SUBCUTANEOUS | Status: DC
  Filled 2022-02-03: qty 0.3

## 2022-02-03 NOTE — Discharge Summary (Signed)
Physician Discharge Summary   Patient: [name] [name] MRN: [id number] DOB: [birth year]  Admit date:     02/01/2022  Discharge date: 02/03/22  Discharge Physician: George Hugh   PCP: London Pepper, MD   Recommendations at discharge:    Please take Hydrocortisone 20 mg in am and 10 mg in pm and follow upw ith Endocrinologist Dr. Meredith Pel in 1 week. Please take your medications for Diabetes Mellitus Type 2.  Discharge Diagnoses: Principal Problem:   Adrenal crisis [facility]) Active Problems:   Adrenal insufficiency ([place])   Uncontrolled type 2 diabetes mellitus with hyperglycemia, with long-term current use of insulin (HCC)   Depression   Hypotension   DNR (do not resuscitate)  Resolved Problems:   * No resolved hospital problems. *  Hospital Course: 61 year old female with PMH of diabetes mellitus type 2 and adrenal insufficiency who presented to the ED on 7/16 with dizziness and hypotension.  She recently stopped taking her hydrocortisone.  Assessment and Plan: Adrenal Insufficiency: BP is stable on Midodrine.  Glucose is stable. HR remains elevated from 108-110 beats/min - this was present on her prior admissions. - Taper Hydrocortisone to regular dose 20 mg in am and 10 mg in pm. - Follow up with Endocrine Dr. Buddy Duty outpatient.   Sinus Tachycardia: - Cultures show no growth. - EKG shows no acute abnormality. - Follow up with your regular Cardiologist outpatient.   Hypotension: - Continue Midodrine.   Diabetes Mellitus Type 2: A1C is 13.3%. - Continue Lantus at 50 units daily. - Change Lispro Ssi to low dose with POC glucose ACHS.   Chronic Pain Medications: - Continue Neurontin for now.   Hyperlipidemia: - Statin.   MDD: - Cymbalta.   Code Status: - DNR  Consultants: None Procedures performed: None  Disposition: Home Diet recommendation:  Discharge Diet Orders (From admission, onward)     Start     Ordered   02/03/22 0000  Diet - low sodium heart healthy         02/03/22 1015           Cardiac and Carb modified diet DISCHARGE MEDICATION: Allergies as of 02/03/2022       Reactions   Tramadol Nausea And Vomiting   REACTION: Projectile vomiting        Medication List     TAKE these medications    Accu-Chek Guide test strip Generic drug: glucose blood Use as instructed tid   Accu-Chek Guide w/Device Kit Use as directed tid   Accu-Chek Softclix Lancets lancets Use as instructed tid before meals   acetaminophen 325 MG tablet Commonly known as: TYLENOL Take 1-2 tablets (325-650 mg total) by mouth every 4 (four) hours as needed for mild pain.   aspirin EC 81 MG tablet Take 1 tablet (81 mg total) by mouth daily. Swallow whole.   atorvastatin 40 MG tablet Commonly known as: LIPITOR TAKE 1 TABLET(40 MG) BY MOUTH DAILY What changed: See the new instructions.   DULoxetine 60 MG capsule Commonly known as: CYMBALTA TAKE 1 CAPSULE(60 MG) BY MOUTH TWICE DAILY What changed: See the new instructions.   gabapentin 600 MG tablet Commonly known as: NEURONTIN TAKE 1 TABLET BY MOUTH EVERY MORNING AND EVERY AFTERNOON THEN TAKE 2 TABLETS BY MOUTH AT NIGHT What changed:  how much to take how to take this when to take this   hydrocortisone 10 MG tablet Commonly known as: CORTEF Take 2 tablets (20 mg total) by mouth every morning AND 1 tablet (10  mg total) every evening.   insulin aspart 100 UNIT/ML injection Commonly known as: novoLOG Inject 6 Units into the skin 3 (three) times daily with meals. What changed:  how much to take when to take this additional instructions   Lantus SoloStar 100 UNIT/ML Solostar Pen Generic drug: insulin glargine Inject 50 Units into the skin at bedtime.   midodrine 2.5 MG tablet Commonly known as: PROAMATINE Take 1 tablet (2.5 mg total) by mouth 3 (three) times daily.   multivitamin with minerals Tabs tablet Take 1 tablet by mouth daily. Centrum silver   Pen Needles 31G X 8 MM Misc Use as  directed   polyethylene glycol 17 g packet Commonly known as: MIRALAX / GLYCOLAX Take 17 g by mouth daily as needed for mild constipation.        Follow-up Information     Delrae Rend, MD. Call.   Specialty: Endocrinology Why: make an appt to follow up with endocrinology Contact information: [address] [place] [address] [phone]                Discharge Exam: Danley Danker Weights   02/01/22 1540  Weight: 65.3 kg   General exam: no acute distress HEENT: NCAT, PERRL Respiratory system: CTAB no WRR Cardiovascular system: Did not appreciate a murmur, regular, No JVD. Gastrointestinal system: No flank pain, Abdomen soft, NT,ND, BS+. Nervous System: No focal deficits. Extremities: No edema, distal peripheral pulses palpable.  Skin: No rashes, No bruises, No icterus. MSK: muscle tone wnl  Condition at discharge: stable  The results of significant diagnostics from this hospitalization (including imaging, microbiology, ancillary and laboratory) are listed below for reference.   Imaging Studies: DG Chest Port 1 View  Result Date: 02/01/2022 CLINICAL DATA:  61 year old female with history of syncope. EXAM: PORTABLE CHEST 1 VIEW COMPARISON:  Chest x-ray 06/17/2021. FINDINGS: Lung volumes are normal. No consolidative airspace disease. No pleural effusions. No pneumothorax. No pulmonary nodule or mass noted. Pulmonary vasculature and the cardiomediastinal silhouette are within normal limits. IMPRESSION: No radiographic evidence of acute cardiopulmonary disease. Electronically Signed   By: Vinnie Langton M.D.   On: 02/01/2022 05:42    Microbiology: Results for orders placed or performed during the hospital encounter of 02/01/22  Culture, blood (Routine X 2) w Reflex to ID Panel     Status: None (Preliminary result)   Collection Time: 02/02/22 12:24 PM   Specimen: BLOOD  Result Value Ref Range Status   Specimen Description BLOOD RIGHT ANTECUBITAL   Final   Special Requests   Final    BOTTLES DRAWN AEROBIC AND ANAEROBIC Blood Culture adequate volume   Culture   Final    NO GROWTH 1 DAY Performed at [facility], [place] [address], [place], [place] [address]    Report Status PENDING  Incomplete  Culture, blood (Routine X 2) w Reflex to ID Panel     Status: None (Preliminary result)   Collection Time: 02/02/22 12:28 PM   Specimen: BLOOD RIGHT HAND  Result Value Ref Range Status   Specimen Description BLOOD RIGHT HAND  Final   Special Requests   Final    BOTTLES DRAWN AEROBIC AND ANAEROBIC Blood Culture adequate volume   Culture   Final    NO GROWTH 1 DAY Performed at [facility], [place] [address], [place], [place] [address]    Report Status PENDING  Incomplete    Labs: CBC: Recent Labs  Lab 02/01/22 0540 02/01/22 0543 02/02/22 0113  WBC 5.4  --  6.0  NEUTROABS 2.8  --   --   HGB 11.2* 10.9*  10.5* 11.8*  HCT 33.1* 32.0*  31.0* 34.3*  MCV 91.9  --  90.0  PLT 175  --  885   Basic Metabolic Panel: Recent Labs  Lab 02/01/22 0540 02/01/22 0543 02/02/22 0113 02/03/22 0144  NA 140 141  141 138 139  K 4.3 4.3  4.2 3.3* 3.6  CL 106 105 108 107  CO2 25  --  24 25  GLUCOSE 134* 131* 175* 220*  BUN 10 9 8  7*  CREATININE 0.78 0.70 0.62 0.64  CALCIUM 8.2*  --  8.7* 8.9   Liver Function Tests: Recent Labs  Lab 02/01/22 0540  AST 33  ALT 34  ALKPHOS 71  BILITOT 0.6  PROT 5.5*  ALBUMIN 3.1*   CBG: Recent Labs  Lab 02/02/22 0734 02/02/22 1242 02/02/22 1712 02/02/22 2232 02/03/22 0817  GLUCAP 208* 136* 297* 251* 292*    Discharge time spent: greater than 30 minutes.  Signed: George Hugh, MD [facility] 02/03/2022

## 2022-02-03 NOTE — Progress Notes (Signed)
Explained discharge instructions to patient. Emphasized how to take hydrocortisone daily.  Reviewed follow up appointments. All belongings are in the patient's care. IV removed. Patient verbalized understanding instructions. Awaiting transport to discharge lounge.

## 2022-02-03 NOTE — Plan of Care (Signed)
  Problem: Education: Goal: Ability to describe self-care measures that may prevent or decrease complications (Diabetes Survival Skills Education) will improve Outcome: Adequate for Discharge Goal: Individualized Educational Video(s) Outcome: Adequate for Discharge   Problem: Coping: Goal: Ability to adjust to condition or change in health will improve Outcome: Adequate for Discharge   Problem: Fluid Volume: Goal: Ability to maintain a balanced intake and output will improve Outcome: Adequate for Discharge   Problem: Health Behavior/Discharge Planning: Goal: Ability to identify and utilize available resources and services will improve Outcome: Adequate for Discharge Goal: Ability to manage health-related needs will improve Outcome: Adequate for Discharge   Problem: Nutritional: Goal: Maintenance of adequate nutrition will improve Outcome: Adequate for Discharge Goal: Progress toward achieving an optimal weight will improve Outcome: Adequate for Discharge   Problem: Skin Integrity: Goal: Risk for impaired skin integrity will decrease Outcome: Adequate for Discharge   Problem: Tissue Perfusion: Goal: Adequacy of tissue perfusion will improve Outcome: Adequate for Discharge   Problem: Education: Goal: Knowledge of General Education information will improve Description: Including pain rating scale, medication(s)/side effects and non-pharmacologic comfort measures Outcome: Adequate for Discharge   Problem: Health Behavior/Discharge Planning: Goal: Ability to manage health-related needs will improve Outcome: Adequate for Discharge   Problem: Clinical Measurements: Goal: Ability to maintain clinical measurements within normal limits will improve Outcome: Adequate for Discharge Goal: Will remain free from infection Outcome: Adequate for Discharge Goal: Diagnostic test results will improve Outcome: Adequate for Discharge Goal: Respiratory complications will improve Outcome:  Adequate for Discharge Goal: Cardiovascular complication will be avoided Outcome: Adequate for Discharge   Problem: Activity: Goal: Risk for activity intolerance will decrease Outcome: Adequate for Discharge   Problem: Nutrition: Goal: Adequate nutrition will be maintained Outcome: Adequate for Discharge   Problem: Coping: Goal: Level of anxiety will decrease Outcome: Adequate for Discharge   Problem: Elimination: Goal: Will not experience complications related to bowel motility Outcome: Adequate for Discharge Goal: Will not experience complications related to urinary retention Outcome: Adequate for Discharge   Problem: Pain Managment: Goal: General experience of comfort will improve Outcome: Adequate for Discharge   Problem: Safety: Goal: Ability to remain free from injury will improve Outcome: Adequate for Discharge   Problem: Skin Integrity: Goal: Risk for impaired skin integrity will decrease Outcome: Adequate for Discharge   

## 2022-02-07 LAB — CULTURE, BLOOD (ROUTINE X 2)
Culture: NO GROWTH
Culture: NO GROWTH
Special Requests: ADEQUATE
Special Requests: ADEQUATE

## 2022-02-18 DIAGNOSIS — H53461 Homonymous bilateral field defects, right side: Secondary | ICD-10-CM | POA: Diagnosis not present

## 2022-02-18 DIAGNOSIS — H25811 Combined forms of age-related cataract, right eye: Secondary | ICD-10-CM | POA: Diagnosis not present

## 2022-02-18 DIAGNOSIS — E113393 Type 2 diabetes mellitus with moderate nonproliferative diabetic retinopathy without macular edema, bilateral: Secondary | ICD-10-CM | POA: Diagnosis not present

## 2022-02-26 DIAGNOSIS — R531 Weakness: Secondary | ICD-10-CM | POA: Diagnosis not present

## 2022-03-21 ENCOUNTER — Other Ambulatory Visit: Payer: Self-pay | Admitting: Family Medicine

## 2022-03-21 DIAGNOSIS — Z794 Long term (current) use of insulin: Secondary | ICD-10-CM

## 2022-03-24 DIAGNOSIS — H25811 Combined forms of age-related cataract, right eye: Secondary | ICD-10-CM | POA: Diagnosis not present

## 2022-03-24 NOTE — Telephone Encounter (Signed)
Requested Prescriptions  Pending Prescriptions Disp Refills  . glucose blood (ACCU-CHEK GUIDE) test strip [Pharmacy Med Name: Accu-Chek Guide test strips] 600 strip 0    Sig: use to check blood sugar three times daily as directed     Endocrinology: Diabetes - Testing Supplies Passed - 03/21/2022  9:34 AM      Passed - Valid encounter within last 12 months    Recent Outpatient Visits          9 months ago Type 2 diabetes mellitus with diabetic autonomic neuropathy, with long-term current use of insulin ([place])   [place], [place], MD   1 year ago Type 2 diabetes mellitus with diabetic autonomic neuropathy, with long-term current use of insulin ([place])   [place], Brier, MD   1 year ago Type 2 diabetes mellitus with diabetic autonomic neuropathy, with long-term current use of insulin ([place])   [place], Enobong, MD   1 year ago Adrenal insufficiency [facility])   [place] Charlott Rakes, MD   1 year ago Hospital discharge follow-up   [place], Zelda W, NP             . Accu-Chek Softclix Lancets lancets [Pharmacy Med Name: Accu-Chek Softclix Lancets] 600 each 0    Sig: USE TO CHECK BLOOD SUGAR THREE TIMES DAILY BEFORE MEALS AS DIRECTED     Endocrinology: Diabetes - Testing Supplies Passed - 03/21/2022  9:34 AM      Passed - Valid encounter within last 12 months    Recent Outpatient Visits          9 months ago Type 2 diabetes mellitus with diabetic autonomic neuropathy, with long-term current use of insulin ([place])   [place], [place], MD   1 year ago Type 2 diabetes mellitus with diabetic autonomic neuropathy, with long-term current use of insulin ([place])   [place], [place], MD   1 year ago Type 2 diabetes mellitus with  diabetic autonomic neuropathy, with long-term current use of insulin ([place])   Edmund, Enobong, MD   1 year ago Adrenal insufficiency [facility])   [place] Charlott Rakes, MD   1 year ago Hospital discharge follow-up   [place], Zelda W, NP

## 2022-03-29 DIAGNOSIS — R531 Weakness: Secondary | ICD-10-CM | POA: Diagnosis not present

## 2022-04-02 DIAGNOSIS — H2512 Age-related nuclear cataract, left eye: Secondary | ICD-10-CM | POA: Diagnosis not present

## 2022-04-04 ENCOUNTER — Other Ambulatory Visit: Payer: Self-pay | Admitting: Neurology

## 2022-04-06 DIAGNOSIS — E113393 Type 2 diabetes mellitus with moderate nonproliferative diabetic retinopathy without macular edema, bilateral: Secondary | ICD-10-CM | POA: Diagnosis not present

## 2022-04-07 DIAGNOSIS — H25812 Combined forms of age-related cataract, left eye: Secondary | ICD-10-CM | POA: Diagnosis not present

## 2022-04-09 ENCOUNTER — Other Ambulatory Visit: Payer: Self-pay | Admitting: Neurology

## 2022-04-23 ENCOUNTER — Other Ambulatory Visit: Payer: Self-pay | Admitting: Family Medicine

## 2022-04-23 DIAGNOSIS — I469 Cardiac arrest, cause unspecified: Secondary | ICD-10-CM | POA: Diagnosis not present

## 2022-04-23 DIAGNOSIS — Z794 Long term (current) use of insulin: Secondary | ICD-10-CM

## 2022-04-24 ENCOUNTER — Other Ambulatory Visit: Payer: Self-pay | Admitting: Family Medicine

## 2022-04-24 DIAGNOSIS — Z794 Long term (current) use of insulin: Secondary | ICD-10-CM

## 2022-04-25 ENCOUNTER — Other Ambulatory Visit: Payer: Self-pay | Admitting: Family Medicine

## 2022-04-25 DIAGNOSIS — E1143 Type 2 diabetes mellitus with diabetic autonomic (poly)neuropathy: Secondary | ICD-10-CM

## 2022-04-25 DIAGNOSIS — Z794 Long term (current) use of insulin: Secondary | ICD-10-CM

## 2022-04-27 NOTE — Telephone Encounter (Signed)
Requested medications are due for refill today.  no  Requested medications are on the active medications list.  yes  Last refill. Both refilled 03/24/2022 #600 0 rf  Future visit scheduled.   no  Notes to clinic.  London Pepper listed as PCP. Refill not due.    Requested Prescriptions  Pending Prescriptions Disp Refills   Accu-Chek Softclix Lancets lancets [Pharmacy Med Name: Accu-Chek Softclix Lancets] 600 each 0    Sig: USE TO CHECK BLOOD SUGAR THREE TIMES DAILY BEFORE MEALS AS DIRECTED     Endocrinology: Diabetes - Testing Supplies Passed - 04/25/2022  9:53 AM      Passed - Valid encounter within last 12 months    Recent Outpatient Visits           10 months ago Type 2 diabetes mellitus with diabetic autonomic neuropathy, with long-term current use of insulin ([place])   [place], Ben Wheeler, MD   1 year ago Type 2 diabetes mellitus with diabetic autonomic neuropathy, with long-term current use of insulin ([place])   [place], Galva, MD   1 year ago Type 2 diabetes mellitus with diabetic autonomic neuropathy, with long-term current use of insulin ([place])   [place], Charlane Ferretti, MD   1 year ago Adrenal insufficiency [facility])   [facility] Charlott Rakes, MD   1 year ago Hospital discharge follow-up   [place], Vernia Buff, NP               ACCU-CHEK GUIDE test strip [place] Med Name: Accu-Chek Guide test strips] 600 strip 0    Sig: use to check blood sugar three times daily as directed     Endocrinology: Diabetes - Testing Supplies Passed - 04/25/2022  9:53 AM      Passed - Valid encounter within last 12 months    Recent Outpatient Visits           10 months ago Type 2 diabetes mellitus with diabetic autonomic neuropathy, with long-term current use of insulin ([place])   [place], [place], MD   1 year ago Type 2 diabetes mellitus with diabetic autonomic neuropathy, with long-term current use of insulin ([place])   [place], [place], MD   1 year ago Type 2 diabetes mellitus with diabetic autonomic neuropathy, with long-term current use of insulin ([place])   [place], Enobong, MD   1 year ago Adrenal insufficiency [facility])   [place] Charlott Rakes, MD   1 year ago Hospital discharge follow-up   [place], Zelda W, NP

## 2022-04-28 ENCOUNTER — Other Ambulatory Visit: Payer: Self-pay | Admitting: Family Medicine

## 2022-04-28 DIAGNOSIS — E1143 Type 2 diabetes mellitus with diabetic autonomic (poly)neuropathy: Secondary | ICD-10-CM

## 2022-04-28 DIAGNOSIS — R531 Weakness: Secondary | ICD-10-CM | POA: Diagnosis not present

## 2022-04-29 ENCOUNTER — Other Ambulatory Visit: Payer: Self-pay | Admitting: Family Medicine

## 2022-04-29 ENCOUNTER — Ambulatory Visit: Payer: Medicare Other | Admitting: Internal Medicine

## 2022-04-29 DIAGNOSIS — Z794 Long term (current) use of insulin: Secondary | ICD-10-CM

## 2022-04-29 NOTE — Progress Notes (Deleted)
Name: [name] [name]  MRN/ DOB: [id number], Nov 28, 1960   Age/ Sex: 61 y.o., female    PCP: London Pepper, MD   Reason for Endocrinology Evaluation: Type 1 Diabetes Mellitus/ adrenal insufficiency      Date of Initial Endocrinology Visit: 04/29/2022     PATIENT IDENTIFIER: [name] [name] is a 61 y.o. female with a past medical history of DM, Hx of CVA. The patient presented for initial endocrinology clinic visit on 04/29/2022 for consultative assistance with her diabetes management.    HPI: [name] [name] was    Diagnosed with DM *** Prior Medications tried/Intolerance: *** Currently checking blood sugars *** x / day,  before breakfast and ***.  Hypoglycemia episodes : ***               Symptoms: ***                 Frequency: ***/  Hemoglobin A1c has ranged from *** in ***, peaking at *** in ***. Patient required assistance for hypoglycemia:  Patient has required hospitalization within the last 1 year from hyper or hypoglycemia: 07/2020 and 05/2021 DKA  In terms of diet, the patient ***  ADRENAL INSUFFICIENCY HISTORY : Pt with recurrent syncopal episodes  She had a normal cosyntropin stimulation test on 01/06/2019 with a cortisol of 34.1 at 60 minute ug/dL  In 2021 she recived multiple hydrocortisone  injections in 2021, 2022 and 2023   [facility] 08/12/2020 by hospitalist She  has been on midodrine but after presenting to the ED 02/01/2022 with hypotension and dizziness she was deemed adrenally insufficient and started on Hydrocortisone      HOME DIABETES REGIMEN: Lantus  Novolog  HC 20 mg QAm and 10 mg QPM   Statin: yes ACE-I/ARB: {YES/NO:17245} Prior Diabetic Education: {Yes/No:11203}   METER DOWNLOAD SUMMARY: Date range evaluated: *** Fingerstick Blood Glucose Tests = *** Average Number Tests/Day = *** Overall Mean FS Glucose = *** Standard Deviation = ***  BG Ranges: Low = *** High = ***   Hypoglycemic Events/30 Days: BG < 50 = *** Episodes of symptomatic  severe hypoglycemia = ***   DIABETIC COMPLICATIONS: Microvascular complications:  *** Denies: CKD Last eye exam: Completed   Macrovascular complications:  Hx of CVA Denies: CAD, PVD   PAST HISTORY: Past Medical History:  Past Medical History:  Diagnosis Date   Cervical disc disorder with radiculopathy of cervical region    Chronic pain syndrome    Degenerative joint disease    Depression dx 1997   Diabetes mellitus, type 2 ([place]) 2011   No insulin   Diabetic polyneuropathy (HCC)    GERD (gastroesophageal reflux disease)    Glaucoma    Headache(784.0)    Hypertension    Lab 11/2011:  CXR, EKG, CBC, TSH, BMet, troponin-normal; lipid profile: 188, 131, 36, 126    Lumbar herniated disc    Non-compliance    Pancreatic cyst    Endoscopic aspiration in 09/2009   Pneumonia 08/02/2012   Shingles    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 Her smoking use included cigarettes. She has never used smokeless tobacco. She reports that she does not currently use drugs after having used the following drugs: Marijuana. She reports that she does not drink alcohol. Family History:  Family History  Problem Relation Age of Onset   Depression Mother        type 1   Diabetes Mother    Renal Disease Mother    Lung cancer Father    Heart attack Brother        Died age 80 - was told due to massive MI/heart "exploded"   Heart failure Maternal Grandmother        Details not totally clear     HOME MEDICATIONS: Allergies as of 04/29/2022       Reactions    Tramadol Nausea And Vomiting   REACTION: Projectile vomiting        Medication List        Accurate as of April 29, 2022  9:27 AM. If you have any questions, ask your nurse or doctor.          Accu-Chek Guide test strip Generic drug: glucose blood use to check blood sugar three times daily as directed   Accu-Chek Guide w/Device Kit Use as directed tid   Accu-Chek Softclix Lancets lancets USE TO CHECK BLOOD SUGAR THREE TIMES DAILY BEFORE MEALS AS DIRECTED   acetaminophen 325 MG tablet Commonly known as: TYLENOL Take 1-2 tablets (325-650 mg total) by mouth every 4 (four) hours as needed for mild pain.   aspirin EC 81 MG tablet Take 1 tablet (81 mg total) by mouth daily. Swallow whole.   atorvastatin 40 MG tablet Commonly known as: LIPITOR TAKE 1 TABLET(40 MG) BY MOUTH DAILY What changed: See the new instructions.   DULoxetine 60 MG capsule Commonly known as: CYMBALTA TAKE 1 CAPSULE(60 MG) BY MOUTH TWICE DAILY What changed: See the new instructions.   gabapentin 600 MG tablet Commonly known as: NEURONTIN TAKE 1 TABLET BY MOUTH EVERY MORNING AND EVERY AFTERNOON THEN TAKE 2 TABLETS BY MOUTH AT NIGHT What changed:  how much to take how to take this when to take this   hydrocortisone 10 MG tablet Commonly known as: CORTEF Take 2 tablets (20 mg total) by mouth every morning AND 1 tablet (10 mg total) every evening.   insulin aspart 100 UNIT/ML injection Commonly known as: novoLOG Inject 6 Units into the skin 3 (three) times daily with meals. What changed:  how much to take when to take this additional instructions   Lantus SoloStar 100 UNIT/ML Solostar Pen Generic drug: insulin glargine Inject 50 Units into the skin at bedtime.   midodrine 2.5 MG tablet Commonly known as: PROAMATINE Take 1 tablet (2.5 mg total) by mouth 3 (three) times daily.   multivitamin with minerals Tabs tablet Take 1 tablet by mouth daily. Centrum silver   Pen Needles 31G X 8  MM Misc Use as directed   polyethylene glycol 17 g packet Commonly known as: MIRALAX / GLYCOLAX Take 17 g by mouth daily as needed for mild constipation.   pyridostigmine 60 MG tablet Commonly known as: MESTINON TAKE ONE TABLET BY MOUTH THREE TIMES DAILY         ALLERGIES: Allergies  Allergen Reactions   Tramadol Nausea And Vomiting    REACTION: Projectile vomiting     REVIEW OF SYSTEMS: A comprehensive ROS was conducted with the patient and is negative  except as per HPI and below:  ROS    OBJECTIVE:   VITAL SIGNS: There were no vitals taken for this visit.   PHYSICAL EXAM:  General: Pt appears well and is in NAD  Neck: General: Supple without adenopathy or carotid bruits. Thyroid: Thyroid size normal.  No goiter or nodules appreciated.   Lungs: Clear with good BS bilat with no rales, rhonchi, or wheezes  Heart: RRR   Abdomen:  soft, nontender  Extremities:  Lower extremities - No pretibial edema. No lesions.  Skin: Normal texture and temperature to palpation. No rash noted.  Neuro: MS is good with appropriate affect, pt is alert and Ox3    DM foot exam:    DATA REVIEWED:  Lab Results  Component Value Date   HGBA1C 13.3 (H) 08/17/2021   HGBA1C >15.5 (H) 06/17/2021   HGBA1C 13.6 (A) 11/25/2020   Lab Results  Component Value Date   MICROALBUR 0.3 10/23/2014   LDLCALC 106 (H) 08/18/2021   CREATININE 0.64 02/03/2022   Lab Results  Component Value Date   MICRALBCREAT 35 (H) 08/09/2019    Lab Results  Component Value Date   CHOL 162 08/18/2021   HDL 34 (L) 08/18/2021   LDLCALC 106 (H) 08/18/2021   TRIG 109 08/18/2021   CHOLHDL 4.8 08/18/2021        ASSESSMENT / PLAN / RECOMMENDATIONS:   1) Type *** Diabetes Mellitus, ***controlled, With*** complications - Most recent A1c of *** %. Goal A1c < *** %.  ***  Plan: GENERAL: ***  MEDICATIONS: ***  EDUCATION / INSTRUCTIONS: BG monitoring instructions: Patient is instructed to check her blood  sugars *** times a day, ***. Call [facility] if: BG persistently < 70  I reviewed the Rule of 15 for the treatment of hypoglycemia in detail with the patient. Literature supplied.   2) Diabetic complications:  Eye: Does *** have known diabetic retinopathy.  Neuro/ Feet: Does *** have known diabetic peripheral neuropathy. Renal: Patient does *** have known baseline CKD. She is *** on an ACEI/ARB at present.  3) Lipids: Patient is *** on a statin.    4) Hypertension: ***  at goal of < 140/90 mmHg.       Signed electronically by: Mack Guise, MD  [facility]  [facility] [address] [place], [place] [address] Phone: [phone] FAX: [phone]   CC: London Pepper, MD [place] [address] Phone: [phone]  Fax: [phone]    Return to Endocrinology clinic as below: Future Appointments  Date Time Provider [place]  04/29/2022  1:00 PM Amilya Haver, Melanie Crazier, MD [id number] None

## 2022-04-30 ENCOUNTER — Other Ambulatory Visit: Payer: Self-pay | Admitting: Family Medicine

## 2022-04-30 DIAGNOSIS — Z794 Long term (current) use of insulin: Secondary | ICD-10-CM

## 2022-05-20 DEATH — deceased
